# Patient Record
Sex: Female | Born: 1935 | Race: White | Hispanic: No | Smoking: Never smoker
Health system: Southern US, Community
[De-identification: ages and names within clinical notes are randomized; demographics above are authoritative.]

## PROBLEM LIST (undated history)

## (undated) DIAGNOSIS — M1990 Unspecified osteoarthritis, unspecified site: Secondary | ICD-10-CM

## (undated) DIAGNOSIS — E785 Hyperlipidemia, unspecified: Secondary | ICD-10-CM

## (undated) DIAGNOSIS — E079 Disorder of thyroid, unspecified: Secondary | ICD-10-CM

## (undated) DIAGNOSIS — M8580 Other specified disorders of bone density and structure, unspecified site: Secondary | ICD-10-CM

## (undated) DIAGNOSIS — K449 Diaphragmatic hernia without obstruction or gangrene: Secondary | ICD-10-CM

## (undated) DIAGNOSIS — E039 Hypothyroidism, unspecified: Secondary | ICD-10-CM

## (undated) DIAGNOSIS — I10 Essential (primary) hypertension: Secondary | ICD-10-CM

## (undated) DIAGNOSIS — M549 Dorsalgia, unspecified: Secondary | ICD-10-CM

## (undated) DIAGNOSIS — F419 Anxiety disorder, unspecified: Secondary | ICD-10-CM

## (undated) DIAGNOSIS — N390 Urinary tract infection, site not specified: Secondary | ICD-10-CM

## (undated) HISTORY — PX: HEMORRHOID SURGERY: SHX153

## (undated) HISTORY — DX: Diaphragmatic hernia without obstruction or gangrene: K44.9

## (undated) HISTORY — DX: Other specified disorders of bone density and structure, unspecified site: M85.80

## (undated) HISTORY — DX: Disorder of thyroid, unspecified: E07.9

## (undated) HISTORY — DX: Urinary tract infection, site not specified: N39.0

## (undated) HISTORY — DX: Hypothyroidism, unspecified: E03.9

## (undated) HISTORY — DX: Hyperlipidemia, unspecified: E78.5

## (undated) HISTORY — DX: Essential (primary) hypertension: I10

## (undated) HISTORY — PX: HERNIA REPAIR: SHX51

## (undated) HISTORY — DX: Dorsalgia, unspecified: M54.9

## (undated) HISTORY — DX: Anxiety disorder, unspecified: F41.9

## (undated) HISTORY — PX: CHOLECYSTECTOMY: SHX55

---

## 2003-06-10 ENCOUNTER — Other Ambulatory Visit: Payer: Self-pay

## 2004-02-28 ENCOUNTER — Other Ambulatory Visit: Payer: Self-pay

## 2004-07-24 ENCOUNTER — Emergency Department: Payer: Self-pay | Admitting: Emergency Medicine

## 2004-07-28 ENCOUNTER — Other Ambulatory Visit: Payer: Self-pay

## 2004-07-28 ENCOUNTER — Emergency Department: Payer: Self-pay | Admitting: Emergency Medicine

## 2005-05-25 ENCOUNTER — Emergency Department: Payer: Self-pay | Admitting: Emergency Medicine

## 2005-05-28 ENCOUNTER — Ambulatory Visit: Payer: Self-pay | Admitting: Family Medicine

## 2006-04-18 ENCOUNTER — Inpatient Hospital Stay: Payer: Self-pay | Admitting: Vascular Surgery

## 2006-05-14 ENCOUNTER — Emergency Department: Payer: Self-pay | Admitting: Emergency Medicine

## 2006-05-28 ENCOUNTER — Ambulatory Visit: Payer: Self-pay | Admitting: Vascular Surgery

## 2006-06-12 ENCOUNTER — Ambulatory Visit: Payer: Self-pay | Admitting: Nurse Practitioner

## 2006-07-11 DIAGNOSIS — F419 Anxiety disorder, unspecified: Secondary | ICD-10-CM | POA: Insufficient documentation

## 2006-07-11 DIAGNOSIS — G8929 Other chronic pain: Secondary | ICD-10-CM | POA: Insufficient documentation

## 2007-06-15 ENCOUNTER — Ambulatory Visit: Payer: Self-pay | Admitting: Family Medicine

## 2007-11-03 ENCOUNTER — Emergency Department: Payer: Self-pay | Admitting: Emergency Medicine

## 2007-11-08 ENCOUNTER — Other Ambulatory Visit: Payer: Self-pay

## 2007-11-08 ENCOUNTER — Emergency Department: Payer: Self-pay | Admitting: Internal Medicine

## 2007-11-18 ENCOUNTER — Ambulatory Visit: Payer: Self-pay | Admitting: Vascular Surgery

## 2007-12-09 ENCOUNTER — Ambulatory Visit: Payer: Self-pay | Admitting: Gastroenterology

## 2008-02-06 ENCOUNTER — Emergency Department: Payer: Self-pay | Admitting: Emergency Medicine

## 2008-07-07 ENCOUNTER — Ambulatory Visit: Payer: Self-pay | Admitting: Family Medicine

## 2009-04-14 ENCOUNTER — Emergency Department: Payer: Self-pay | Admitting: Emergency Medicine

## 2009-05-17 ENCOUNTER — Ambulatory Visit: Payer: Self-pay | Admitting: Orthopedic Surgery

## 2009-06-06 ENCOUNTER — Ambulatory Visit: Payer: Self-pay | Admitting: Pain Medicine

## 2009-06-27 ENCOUNTER — Ambulatory Visit: Payer: Self-pay | Admitting: Physician Assistant

## 2009-09-26 ENCOUNTER — Ambulatory Visit: Payer: Self-pay | Admitting: Pain Medicine

## 2009-10-12 ENCOUNTER — Ambulatory Visit: Payer: Self-pay | Admitting: Pain Medicine

## 2010-01-09 ENCOUNTER — Ambulatory Visit: Payer: Self-pay | Admitting: Pain Medicine

## 2010-01-24 ENCOUNTER — Ambulatory Visit: Payer: Self-pay | Admitting: Pain Medicine

## 2010-02-04 ENCOUNTER — Inpatient Hospital Stay: Payer: Self-pay | Admitting: Specialist

## 2010-02-27 ENCOUNTER — Ambulatory Visit: Payer: Self-pay | Admitting: Family Medicine

## 2010-04-24 ENCOUNTER — Ambulatory Visit: Payer: Self-pay | Admitting: Pain Medicine

## 2010-05-10 ENCOUNTER — Ambulatory Visit: Payer: Self-pay | Admitting: Pain Medicine

## 2010-05-26 ENCOUNTER — Emergency Department: Payer: Self-pay | Admitting: Emergency Medicine

## 2010-05-29 DIAGNOSIS — K8020 Calculus of gallbladder without cholecystitis without obstruction: Secondary | ICD-10-CM | POA: Insufficient documentation

## 2010-07-04 DIAGNOSIS — R300 Dysuria: Secondary | ICD-10-CM | POA: Insufficient documentation

## 2010-08-24 ENCOUNTER — Ambulatory Visit: Payer: Self-pay | Admitting: Family Medicine

## 2010-08-30 ENCOUNTER — Ambulatory Visit: Payer: Self-pay | Admitting: Pain Medicine

## 2010-09-10 ENCOUNTER — Ambulatory Visit: Payer: Self-pay | Admitting: Pain Medicine

## 2010-09-19 ENCOUNTER — Ambulatory Visit: Payer: Self-pay | Admitting: Pain Medicine

## 2010-09-28 ENCOUNTER — Ambulatory Visit: Payer: Self-pay | Admitting: Pain Medicine

## 2010-10-15 DIAGNOSIS — Z9181 History of falling: Secondary | ICD-10-CM | POA: Insufficient documentation

## 2010-10-17 ENCOUNTER — Ambulatory Visit: Payer: Self-pay | Admitting: Pain Medicine

## 2010-10-29 DIAGNOSIS — Z8679 Personal history of other diseases of the circulatory system: Secondary | ICD-10-CM | POA: Insufficient documentation

## 2011-01-03 ENCOUNTER — Ambulatory Visit: Payer: Self-pay | Admitting: Pain Medicine

## 2011-01-14 ENCOUNTER — Ambulatory Visit: Payer: Self-pay | Admitting: Pain Medicine

## 2011-02-28 ENCOUNTER — Ambulatory Visit: Payer: Self-pay | Admitting: Pain Medicine

## 2011-03-12 ENCOUNTER — Other Ambulatory Visit: Payer: Self-pay | Admitting: Pain Medicine

## 2011-03-18 ENCOUNTER — Ambulatory Visit: Payer: Self-pay | Admitting: Pain Medicine

## 2011-04-07 ENCOUNTER — Emergency Department: Payer: Self-pay | Admitting: Emergency Medicine

## 2011-05-15 ENCOUNTER — Ambulatory Visit: Payer: Self-pay | Admitting: Family Medicine

## 2011-06-05 DIAGNOSIS — F32A Depression, unspecified: Secondary | ICD-10-CM | POA: Insufficient documentation

## 2011-06-13 ENCOUNTER — Ambulatory Visit: Payer: Self-pay | Admitting: Pain Medicine

## 2011-07-01 ENCOUNTER — Inpatient Hospital Stay: Payer: Self-pay | Admitting: Internal Medicine

## 2011-07-03 ENCOUNTER — Ambulatory Visit: Payer: Self-pay | Admitting: Pain Medicine

## 2011-07-04 LAB — PATHOLOGY REPORT

## 2011-08-05 ENCOUNTER — Observation Stay: Payer: Self-pay | Admitting: Internal Medicine

## 2011-08-05 DIAGNOSIS — M545 Low back pain, unspecified: Secondary | ICD-10-CM | POA: Diagnosis not present

## 2011-08-05 DIAGNOSIS — E785 Hyperlipidemia, unspecified: Secondary | ICD-10-CM | POA: Diagnosis not present

## 2011-08-05 DIAGNOSIS — M810 Age-related osteoporosis without current pathological fracture: Secondary | ICD-10-CM | POA: Diagnosis not present

## 2011-08-05 DIAGNOSIS — E039 Hypothyroidism, unspecified: Secondary | ICD-10-CM | POA: Diagnosis not present

## 2011-08-05 DIAGNOSIS — G43909 Migraine, unspecified, not intractable, without status migrainosus: Secondary | ICD-10-CM | POA: Diagnosis not present

## 2011-08-05 DIAGNOSIS — G8929 Other chronic pain: Secondary | ICD-10-CM | POA: Diagnosis not present

## 2011-08-05 DIAGNOSIS — Z79899 Other long term (current) drug therapy: Secondary | ICD-10-CM | POA: Diagnosis not present

## 2011-08-05 DIAGNOSIS — F341 Dysthymic disorder: Secondary | ICD-10-CM | POA: Diagnosis not present

## 2011-08-05 DIAGNOSIS — I10 Essential (primary) hypertension: Secondary | ICD-10-CM | POA: Diagnosis not present

## 2011-08-05 DIAGNOSIS — H409 Unspecified glaucoma: Secondary | ICD-10-CM | POA: Diagnosis not present

## 2011-08-05 DIAGNOSIS — A498 Other bacterial infections of unspecified site: Secondary | ICD-10-CM | POA: Diagnosis not present

## 2011-08-05 DIAGNOSIS — N390 Urinary tract infection, site not specified: Secondary | ICD-10-CM | POA: Diagnosis not present

## 2011-08-05 DIAGNOSIS — M1990 Unspecified osteoarthritis, unspecified site: Secondary | ICD-10-CM | POA: Diagnosis not present

## 2011-08-05 LAB — URINALYSIS, COMPLETE
Bilirubin,UR: NEGATIVE
Blood: NEGATIVE
Glucose,UR: NEGATIVE mg/dL (ref 0–75)
Ketone: NEGATIVE
Leukocyte Esterase: NEGATIVE
Nitrite: NEGATIVE
Ph: 7 (ref 4.5–8.0)
Protein: NEGATIVE
RBC,UR: <1 /HPF (ref 0–5)
Specific Gravity: 1.004 (ref 1.003–1.030)
Squamous Epithelial: NONE SEEN
Transitional Epi: <1
WBC UR: 1 /HPF (ref 0–5)

## 2011-08-05 LAB — CBC
HCT: 40.8 % (ref 35.0–47.0)
HGB: 13.9 g/dL (ref 12.0–16.0)
MCH: 30 pg (ref 26.0–34.0)
MCHC: 34.1 g/dL (ref 32.0–36.0)
MCV: 88 fL (ref 80–100)
Platelet: 160 10*3/uL (ref 150–440)
RBC: 4.63 10*6/uL (ref 3.80–5.20)
RDW: 13.3 % (ref 11.5–14.5)
WBC: 4.6 10*3/uL (ref 3.6–11.0)

## 2011-08-05 LAB — BASIC METABOLIC PANEL
Anion Gap: 9 (ref 7–16)
BUN: 13 mg/dL (ref 7–18)
Calcium, Total: 9.7 mg/dL (ref 8.5–10.1)
Chloride: 93 mmol/L — ABNORMAL LOW (ref 98–107)
Co2: 30 mmol/L (ref 21–32)
Creatinine: 0.77 mg/dL (ref 0.60–1.30)
EGFR (African American): >60
EGFR (Non-African Amer.): >60
Glucose: 101 mg/dL — ABNORMAL HIGH (ref 65–99)
Osmolality: 265 (ref 275–301)
Potassium: 3.7 mmol/L (ref 3.5–5.1)
Sodium: 132 mmol/L — ABNORMAL LOW (ref 136–145)

## 2011-08-06 DIAGNOSIS — N390 Urinary tract infection, site not specified: Secondary | ICD-10-CM | POA: Diagnosis not present

## 2011-08-06 DIAGNOSIS — E039 Hypothyroidism, unspecified: Secondary | ICD-10-CM | POA: Diagnosis not present

## 2011-08-06 DIAGNOSIS — I10 Essential (primary) hypertension: Secondary | ICD-10-CM | POA: Diagnosis not present

## 2011-08-06 DIAGNOSIS — E785 Hyperlipidemia, unspecified: Secondary | ICD-10-CM | POA: Diagnosis not present

## 2011-08-06 LAB — CBC WITH DIFFERENTIAL/PLATELET
Basophil #: 0 10*3/uL (ref 0.0–0.1)
Basophil %: 0.5 %
Eosinophil #: 0 10*3/uL (ref 0.0–0.7)
Eosinophil %: 1 %
HCT: 36.1 % (ref 35.0–47.0)
HGB: 12.1 g/dL (ref 12.0–16.0)
Lymphocyte #: 1.3 10*3/uL (ref 1.0–3.6)
Lymphocyte %: 29 %
MCH: 30 pg (ref 26.0–34.0)
MCHC: 33.6 g/dL (ref 32.0–36.0)
MCV: 90 fL (ref 80–100)
Monocyte #: 0.7 10*3/uL (ref 0.0–0.7)
Monocyte %: 14.6 %
Neutrophil #: 2.5 10*3/uL (ref 1.4–6.5)
Neutrophil %: 54.9 %
Platelet: 140 10*3/uL — ABNORMAL LOW (ref 150–440)
RBC: 4.04 10*6/uL (ref 3.80–5.20)
RDW: 13.6 % (ref 11.5–14.5)
WBC: 4.6 10*3/uL (ref 3.6–11.0)

## 2011-08-06 LAB — BASIC METABOLIC PANEL
Anion Gap: 9 (ref 7–16)
BUN: 13 mg/dL (ref 7–18)
Calcium, Total: 9 mg/dL (ref 8.5–10.1)
Chloride: 98 mmol/L (ref 98–107)
Co2: 28 mmol/L (ref 21–32)
Creatinine: 0.71 mg/dL (ref 0.60–1.30)
EGFR (African American): >60
EGFR (Non-African Amer.): >60
Glucose: 97 mg/dL (ref 65–99)
Osmolality: 270 (ref 275–301)
Potassium: 3.5 mmol/L (ref 3.5–5.1)
Sodium: 135 mmol/L — ABNORMAL LOW (ref 136–145)

## 2011-08-06 LAB — MAGNESIUM: Magnesium: 2.3 mg/dL

## 2011-08-07 DIAGNOSIS — N390 Urinary tract infection, site not specified: Secondary | ICD-10-CM | POA: Diagnosis not present

## 2011-08-07 DIAGNOSIS — I10 Essential (primary) hypertension: Secondary | ICD-10-CM | POA: Diagnosis not present

## 2011-08-07 DIAGNOSIS — E785 Hyperlipidemia, unspecified: Secondary | ICD-10-CM | POA: Diagnosis not present

## 2011-08-07 DIAGNOSIS — E039 Hypothyroidism, unspecified: Secondary | ICD-10-CM | POA: Diagnosis not present

## 2011-08-07 LAB — URINE CULTURE

## 2011-08-08 DIAGNOSIS — N390 Urinary tract infection, site not specified: Secondary | ICD-10-CM | POA: Diagnosis not present

## 2011-08-14 DIAGNOSIS — I10 Essential (primary) hypertension: Secondary | ICD-10-CM | POA: Diagnosis not present

## 2011-08-14 DIAGNOSIS — Z1389 Encounter for screening for other disorder: Secondary | ICD-10-CM | POA: Diagnosis not present

## 2011-08-14 DIAGNOSIS — G4700 Insomnia, unspecified: Secondary | ICD-10-CM | POA: Diagnosis not present

## 2011-09-06 DIAGNOSIS — H4010X0 Unspecified open-angle glaucoma, stage unspecified: Secondary | ICD-10-CM | POA: Diagnosis not present

## 2011-09-16 DIAGNOSIS — E039 Hypothyroidism, unspecified: Secondary | ICD-10-CM | POA: Diagnosis not present

## 2011-09-16 DIAGNOSIS — I10 Essential (primary) hypertension: Secondary | ICD-10-CM | POA: Diagnosis not present

## 2011-09-16 DIAGNOSIS — R51 Headache: Secondary | ICD-10-CM | POA: Diagnosis not present

## 2011-09-16 DIAGNOSIS — E7800 Pure hypercholesterolemia, unspecified: Secondary | ICD-10-CM | POA: Diagnosis not present

## 2011-10-16 DIAGNOSIS — R6889 Other general symptoms and signs: Secondary | ICD-10-CM | POA: Diagnosis not present

## 2011-11-01 DIAGNOSIS — R51 Headache: Secondary | ICD-10-CM | POA: Diagnosis not present

## 2011-11-01 DIAGNOSIS — I10 Essential (primary) hypertension: Secondary | ICD-10-CM | POA: Diagnosis not present

## 2011-11-01 DIAGNOSIS — E039 Hypothyroidism, unspecified: Secondary | ICD-10-CM | POA: Diagnosis not present

## 2011-11-01 DIAGNOSIS — G4700 Insomnia, unspecified: Secondary | ICD-10-CM | POA: Diagnosis not present

## 2011-11-28 DIAGNOSIS — E785 Hyperlipidemia, unspecified: Secondary | ICD-10-CM | POA: Diagnosis not present

## 2011-11-28 DIAGNOSIS — I10 Essential (primary) hypertension: Secondary | ICD-10-CM | POA: Diagnosis not present

## 2011-11-28 DIAGNOSIS — E039 Hypothyroidism, unspecified: Secondary | ICD-10-CM | POA: Diagnosis not present

## 2011-12-04 DIAGNOSIS — E039 Hypothyroidism, unspecified: Secondary | ICD-10-CM | POA: Diagnosis not present

## 2011-12-04 DIAGNOSIS — R51 Headache: Secondary | ICD-10-CM | POA: Diagnosis not present

## 2012-01-24 DIAGNOSIS — H4010X0 Unspecified open-angle glaucoma, stage unspecified: Secondary | ICD-10-CM | POA: Diagnosis not present

## 2012-01-31 DIAGNOSIS — R51 Headache: Secondary | ICD-10-CM | POA: Diagnosis not present

## 2012-01-31 DIAGNOSIS — E039 Hypothyroidism, unspecified: Secondary | ICD-10-CM | POA: Diagnosis not present

## 2012-01-31 DIAGNOSIS — M549 Dorsalgia, unspecified: Secondary | ICD-10-CM | POA: Diagnosis not present

## 2012-01-31 DIAGNOSIS — G4700 Insomnia, unspecified: Secondary | ICD-10-CM | POA: Diagnosis not present

## 2012-03-04 DIAGNOSIS — E039 Hypothyroidism, unspecified: Secondary | ICD-10-CM | POA: Diagnosis not present

## 2012-03-04 DIAGNOSIS — R51 Headache: Secondary | ICD-10-CM | POA: Diagnosis not present

## 2012-03-04 DIAGNOSIS — M549 Dorsalgia, unspecified: Secondary | ICD-10-CM | POA: Diagnosis not present

## 2012-03-04 DIAGNOSIS — I10 Essential (primary) hypertension: Secondary | ICD-10-CM | POA: Diagnosis not present

## 2012-03-24 ENCOUNTER — Ambulatory Visit: Payer: Self-pay | Admitting: Pain Medicine

## 2012-03-24 DIAGNOSIS — IMO0002 Reserved for concepts with insufficient information to code with codable children: Secondary | ICD-10-CM | POA: Diagnosis not present

## 2012-04-08 ENCOUNTER — Ambulatory Visit: Payer: Self-pay | Admitting: Pain Medicine

## 2012-04-08 DIAGNOSIS — IMO0001 Reserved for inherently not codable concepts without codable children: Secondary | ICD-10-CM | POA: Diagnosis not present

## 2012-04-08 DIAGNOSIS — M79609 Pain in unspecified limb: Secondary | ICD-10-CM | POA: Diagnosis not present

## 2012-04-08 DIAGNOSIS — M545 Low back pain, unspecified: Secondary | ICD-10-CM | POA: Diagnosis not present

## 2012-04-08 DIAGNOSIS — M169 Osteoarthritis of hip, unspecified: Secondary | ICD-10-CM | POA: Diagnosis not present

## 2012-04-08 DIAGNOSIS — M4716 Other spondylosis with myelopathy, lumbar region: Secondary | ICD-10-CM | POA: Diagnosis not present

## 2012-04-08 DIAGNOSIS — M25569 Pain in unspecified knee: Secondary | ICD-10-CM | POA: Diagnosis not present

## 2012-04-08 DIAGNOSIS — M76899 Other specified enthesopathies of unspecified lower limb, excluding foot: Secondary | ICD-10-CM | POA: Diagnosis not present

## 2012-04-08 DIAGNOSIS — M25559 Pain in unspecified hip: Secondary | ICD-10-CM | POA: Diagnosis not present

## 2012-04-15 DIAGNOSIS — N390 Urinary tract infection, site not specified: Secondary | ICD-10-CM | POA: Diagnosis not present

## 2012-04-15 DIAGNOSIS — Z9181 History of falling: Secondary | ICD-10-CM | POA: Diagnosis not present

## 2012-04-15 DIAGNOSIS — L748 Other eccrine sweat disorders: Secondary | ICD-10-CM | POA: Diagnosis not present

## 2012-04-15 DIAGNOSIS — R5383 Other fatigue: Secondary | ICD-10-CM | POA: Diagnosis not present

## 2012-04-15 DIAGNOSIS — R5381 Other malaise: Secondary | ICD-10-CM | POA: Diagnosis not present

## 2012-04-22 ENCOUNTER — Observation Stay: Payer: Self-pay | Admitting: Internal Medicine

## 2012-04-22 DIAGNOSIS — E782 Mixed hyperlipidemia: Secondary | ICD-10-CM | POA: Diagnosis not present

## 2012-04-22 DIAGNOSIS — R079 Chest pain, unspecified: Secondary | ICD-10-CM | POA: Diagnosis not present

## 2012-04-22 DIAGNOSIS — R42 Dizziness and giddiness: Secondary | ICD-10-CM | POA: Diagnosis not present

## 2012-04-22 DIAGNOSIS — R0789 Other chest pain: Secondary | ICD-10-CM | POA: Diagnosis not present

## 2012-04-22 DIAGNOSIS — M549 Dorsalgia, unspecified: Secondary | ICD-10-CM | POA: Diagnosis not present

## 2012-04-22 DIAGNOSIS — Z79899 Other long term (current) drug therapy: Secondary | ICD-10-CM | POA: Diagnosis not present

## 2012-04-22 DIAGNOSIS — I10 Essential (primary) hypertension: Secondary | ICD-10-CM | POA: Diagnosis not present

## 2012-04-22 DIAGNOSIS — R110 Nausea: Secondary | ICD-10-CM | POA: Diagnosis not present

## 2012-04-22 DIAGNOSIS — E039 Hypothyroidism, unspecified: Secondary | ICD-10-CM | POA: Diagnosis not present

## 2012-04-22 DIAGNOSIS — E785 Hyperlipidemia, unspecified: Secondary | ICD-10-CM | POA: Diagnosis not present

## 2012-04-22 DIAGNOSIS — M1990 Unspecified osteoarthritis, unspecified site: Secondary | ICD-10-CM | POA: Diagnosis not present

## 2012-04-22 DIAGNOSIS — M25559 Pain in unspecified hip: Secondary | ICD-10-CM | POA: Diagnosis not present

## 2012-04-22 DIAGNOSIS — M25519 Pain in unspecified shoulder: Secondary | ICD-10-CM | POA: Diagnosis not present

## 2012-04-22 DIAGNOSIS — E871 Hypo-osmolality and hyponatremia: Secondary | ICD-10-CM | POA: Diagnosis not present

## 2012-04-22 DIAGNOSIS — G8929 Other chronic pain: Secondary | ICD-10-CM | POA: Diagnosis not present

## 2012-04-22 DIAGNOSIS — H409 Unspecified glaucoma: Secondary | ICD-10-CM | POA: Diagnosis not present

## 2012-04-22 DIAGNOSIS — R6889 Other general symptoms and signs: Secondary | ICD-10-CM | POA: Diagnosis not present

## 2012-04-22 LAB — COMPREHENSIVE METABOLIC PANEL
Albumin: 3.6 g/dL (ref 3.4–5.0)
Alkaline Phosphatase: 80 U/L (ref 50–136)
Anion Gap: 7 (ref 7–16)
BUN: 13 mg/dL (ref 7–18)
Bilirubin,Total: 0.5 mg/dL (ref 0.2–1.0)
Calcium, Total: 8.6 mg/dL (ref 8.5–10.1)
Chloride: 93 mmol/L — ABNORMAL LOW (ref 98–107)
Co2: 26 mmol/L (ref 21–32)
Creatinine: 0.73 mg/dL (ref 0.60–1.30)
EGFR (African American): >60
EGFR (Non-African Amer.): >60
Glucose: 123 mg/dL — ABNORMAL HIGH (ref 65–99)
Osmolality: 255 (ref 275–301)
Potassium: 4 mmol/L (ref 3.5–5.1)
SGOT(AST): 21 U/L (ref 15–37)
SGPT (ALT): 20 U/L (ref 12–78)
Sodium: 126 mmol/L — ABNORMAL LOW (ref 136–145)
Total Protein: 7.3 g/dL (ref 6.4–8.2)

## 2012-04-22 LAB — CREATININE, URINE, RANDOM: Creatinine, Urine Random: 64.5 mg/dL (ref 30.0–125.0)

## 2012-04-22 LAB — CBC
HCT: 42 % (ref 35.0–47.0)
HGB: 14.7 g/dL (ref 12.0–16.0)
MCH: 30.1 pg (ref 26.0–34.0)
MCHC: 35.1 g/dL (ref 32.0–36.0)
MCV: 86 fL (ref 80–100)
Platelet: 203 10*3/uL (ref 150–440)
RBC: 4.89 10*6/uL (ref 3.80–5.20)
RDW: 14.1 % (ref 11.5–14.5)
WBC: 6.1 10*3/uL (ref 3.6–11.0)

## 2012-04-22 LAB — TROPONIN I
Troponin-I: <0.02 ng/mL
Troponin-I: <0.02 ng/mL

## 2012-04-22 LAB — CK TOTAL AND CKMB (NOT AT ARMC)
CK, Total: 67 U/L (ref 21–215)
CK, Total: 51 U/L (ref 21–215)
CK-MB: 1 ng/mL (ref 0.5–3.6)
CK-MB: 1.4 ng/mL (ref 0.5–3.6)

## 2012-04-22 LAB — URINALYSIS, COMPLETE
Bilirubin,UR: NEGATIVE
Glucose,UR: NEGATIVE mg/dL (ref 0–75)
Ketone: NEGATIVE
Nitrite: NEGATIVE
Ph: 6 (ref 4.5–8.0)
Protein: 100
RBC,UR: 10 /HPF (ref 0–5)
Specific Gravity: 1.014 (ref 1.003–1.030)
Squamous Epithelial: 3
WBC UR: 3 /HPF (ref 0–5)

## 2012-04-22 LAB — SODIUM, URINE, RANDOM: Sodium, Urine Random: 49 mmol/L (ref 20–110)

## 2012-04-22 LAB — LIPASE, BLOOD: Lipase: 159 U/L (ref 73–393)

## 2012-04-22 LAB — OSMOLALITY, URINE: Osmolality: 381 mOsm/kg

## 2012-04-23 DIAGNOSIS — R079 Chest pain, unspecified: Secondary | ICD-10-CM

## 2012-04-23 DIAGNOSIS — E871 Hypo-osmolality and hyponatremia: Secondary | ICD-10-CM | POA: Diagnosis not present

## 2012-04-23 DIAGNOSIS — E782 Mixed hyperlipidemia: Secondary | ICD-10-CM | POA: Diagnosis not present

## 2012-04-23 DIAGNOSIS — I10 Essential (primary) hypertension: Secondary | ICD-10-CM | POA: Diagnosis not present

## 2012-04-23 LAB — BASIC METABOLIC PANEL
Anion Gap: 12 (ref 7–16)
BUN: 8 mg/dL (ref 7–18)
Calcium, Total: 9.3 mg/dL (ref 8.5–10.1)
Chloride: 86 mmol/L — ABNORMAL LOW (ref 98–107)
Co2: 23 mmol/L (ref 21–32)
Creatinine: 0.71 mg/dL (ref 0.60–1.30)
EGFR (African American): >60
EGFR (Non-African Amer.): >60
Glucose: 115 mg/dL — ABNORMAL HIGH (ref 65–99)
Osmolality: 243 (ref 275–301)
Potassium: 3.6 mmol/L (ref 3.5–5.1)
Sodium: 121 mmol/L — ABNORMAL LOW (ref 136–145)

## 2012-04-23 LAB — TROPONIN I: Troponin-I: <0.02 ng/mL

## 2012-04-23 LAB — CK TOTAL AND CKMB (NOT AT ARMC)
CK, Total: 65 U/L (ref 21–215)
CK-MB: 1.4 ng/mL (ref 0.5–3.6)

## 2012-04-23 LAB — SODIUM, URINE, RANDOM: Sodium, Urine Random: 41 mmol/L (ref 20–110)

## 2012-04-23 LAB — CHLORIDE, URINE, RANDOM: Chloride, Urine Random: 40 mmol/L — ABNORMAL LOW (ref 55–125)

## 2012-04-23 LAB — OSMOLALITY, URINE: Osmolality: 216 mOsm/kg

## 2012-04-24 DIAGNOSIS — E871 Hypo-osmolality and hyponatremia: Secondary | ICD-10-CM | POA: Diagnosis not present

## 2012-04-24 DIAGNOSIS — R079 Chest pain, unspecified: Secondary | ICD-10-CM | POA: Diagnosis not present

## 2012-04-24 DIAGNOSIS — I10 Essential (primary) hypertension: Secondary | ICD-10-CM | POA: Diagnosis not present

## 2012-04-24 DIAGNOSIS — E782 Mixed hyperlipidemia: Secondary | ICD-10-CM | POA: Diagnosis not present

## 2012-04-24 LAB — BASIC METABOLIC PANEL
Anion Gap: 10 (ref 7–16)
BUN: 10 mg/dL (ref 7–18)
Calcium, Total: 9 mg/dL (ref 8.5–10.1)
Chloride: 86 mmol/L — ABNORMAL LOW (ref 98–107)
Co2: 25 mmol/L (ref 21–32)
Creatinine: 0.81 mg/dL (ref 0.60–1.30)
EGFR (African American): >60
EGFR (Non-African Amer.): >60
Glucose: 108 mg/dL — ABNORMAL HIGH (ref 65–99)
Osmolality: 244 (ref 275–301)
Potassium: 3.6 mmol/L (ref 3.5–5.1)
Sodium: 121 mmol/L — ABNORMAL LOW (ref 136–145)

## 2012-04-24 LAB — SODIUM: Sodium: 128 mmol/L — ABNORMAL LOW (ref 136–145)

## 2012-04-25 LAB — URINE CULTURE

## 2012-05-01 DIAGNOSIS — A499 Bacterial infection, unspecified: Secondary | ICD-10-CM | POA: Diagnosis not present

## 2012-05-01 DIAGNOSIS — N390 Urinary tract infection, site not specified: Secondary | ICD-10-CM | POA: Diagnosis not present

## 2012-05-01 DIAGNOSIS — E871 Hypo-osmolality and hyponatremia: Secondary | ICD-10-CM | POA: Diagnosis not present

## 2012-05-01 DIAGNOSIS — I10 Essential (primary) hypertension: Secondary | ICD-10-CM | POA: Diagnosis not present

## 2012-05-12 DIAGNOSIS — E7800 Pure hypercholesterolemia, unspecified: Secondary | ICD-10-CM | POA: Diagnosis not present

## 2012-05-12 DIAGNOSIS — R51 Headache: Secondary | ICD-10-CM | POA: Diagnosis not present

## 2012-05-12 DIAGNOSIS — N289 Disorder of kidney and ureter, unspecified: Secondary | ICD-10-CM | POA: Diagnosis not present

## 2012-05-12 DIAGNOSIS — I10 Essential (primary) hypertension: Secondary | ICD-10-CM | POA: Diagnosis not present

## 2012-05-12 DIAGNOSIS — E039 Hypothyroidism, unspecified: Secondary | ICD-10-CM | POA: Diagnosis not present

## 2012-05-29 DIAGNOSIS — H4010X0 Unspecified open-angle glaucoma, stage unspecified: Secondary | ICD-10-CM | POA: Diagnosis not present

## 2012-06-05 DIAGNOSIS — N390 Urinary tract infection, site not specified: Secondary | ICD-10-CM | POA: Diagnosis not present

## 2012-06-05 DIAGNOSIS — I10 Essential (primary) hypertension: Secondary | ICD-10-CM | POA: Diagnosis not present

## 2012-06-05 DIAGNOSIS — E871 Hypo-osmolality and hyponatremia: Secondary | ICD-10-CM | POA: Diagnosis not present

## 2012-06-07 ENCOUNTER — Emergency Department: Payer: Self-pay | Admitting: Unknown Physician Specialty

## 2012-06-07 DIAGNOSIS — M25559 Pain in unspecified hip: Secondary | ICD-10-CM | POA: Diagnosis not present

## 2012-06-07 DIAGNOSIS — S7000XA Contusion of unspecified hip, initial encounter: Secondary | ICD-10-CM | POA: Diagnosis not present

## 2012-06-10 ENCOUNTER — Ambulatory Visit: Payer: Self-pay | Admitting: Orthopedic Surgery

## 2012-06-10 DIAGNOSIS — M25559 Pain in unspecified hip: Secondary | ICD-10-CM | POA: Diagnosis not present

## 2012-06-10 DIAGNOSIS — R937 Abnormal findings on diagnostic imaging of other parts of musculoskeletal system: Secondary | ICD-10-CM | POA: Diagnosis not present

## 2012-06-10 DIAGNOSIS — S7000XA Contusion of unspecified hip, initial encounter: Secondary | ICD-10-CM | POA: Diagnosis not present

## 2012-06-12 DIAGNOSIS — M549 Dorsalgia, unspecified: Secondary | ICD-10-CM | POA: Diagnosis not present

## 2012-06-12 DIAGNOSIS — IMO0002 Reserved for concepts with insufficient information to code with codable children: Secondary | ICD-10-CM | POA: Diagnosis not present

## 2012-06-29 DIAGNOSIS — E039 Hypothyroidism, unspecified: Secondary | ICD-10-CM | POA: Diagnosis not present

## 2012-06-29 DIAGNOSIS — R51 Headache: Secondary | ICD-10-CM | POA: Diagnosis not present

## 2012-06-29 DIAGNOSIS — N289 Disorder of kidney and ureter, unspecified: Secondary | ICD-10-CM | POA: Diagnosis not present

## 2012-06-29 DIAGNOSIS — E7800 Pure hypercholesterolemia, unspecified: Secondary | ICD-10-CM | POA: Diagnosis not present

## 2012-06-30 ENCOUNTER — Ambulatory Visit: Payer: Self-pay | Admitting: Ophthalmology

## 2012-06-30 DIAGNOSIS — Z01810 Encounter for preprocedural cardiovascular examination: Secondary | ICD-10-CM | POA: Diagnosis not present

## 2012-06-30 DIAGNOSIS — I10 Essential (primary) hypertension: Secondary | ICD-10-CM | POA: Diagnosis not present

## 2012-06-30 DIAGNOSIS — H2510 Age-related nuclear cataract, unspecified eye: Secondary | ICD-10-CM | POA: Diagnosis not present

## 2012-06-30 DIAGNOSIS — Z01812 Encounter for preprocedural laboratory examination: Secondary | ICD-10-CM | POA: Diagnosis not present

## 2012-06-30 LAB — POTASSIUM: Potassium: 4.1 mmol/L (ref 3.5–5.1)

## 2012-07-02 ENCOUNTER — Ambulatory Visit: Payer: Self-pay | Admitting: Pain Medicine

## 2012-07-02 DIAGNOSIS — IMO0002 Reserved for concepts with insufficient information to code with codable children: Secondary | ICD-10-CM | POA: Diagnosis not present

## 2012-07-06 DIAGNOSIS — N289 Disorder of kidney and ureter, unspecified: Secondary | ICD-10-CM | POA: Diagnosis not present

## 2012-07-06 DIAGNOSIS — F29 Unspecified psychosis not due to a substance or known physiological condition: Secondary | ICD-10-CM | POA: Diagnosis not present

## 2012-07-06 DIAGNOSIS — N390 Urinary tract infection, site not specified: Secondary | ICD-10-CM | POA: Diagnosis not present

## 2012-07-08 ENCOUNTER — Ambulatory Visit: Payer: Self-pay | Admitting: Ophthalmology

## 2012-07-08 DIAGNOSIS — I10 Essential (primary) hypertension: Secondary | ICD-10-CM | POA: Diagnosis not present

## 2012-07-08 DIAGNOSIS — Z85828 Personal history of other malignant neoplasm of skin: Secondary | ICD-10-CM | POA: Diagnosis not present

## 2012-07-08 DIAGNOSIS — E7800 Pure hypercholesterolemia, unspecified: Secondary | ICD-10-CM | POA: Diagnosis not present

## 2012-07-08 DIAGNOSIS — Z79899 Other long term (current) drug therapy: Secondary | ICD-10-CM | POA: Diagnosis not present

## 2012-07-08 DIAGNOSIS — G43909 Migraine, unspecified, not intractable, without status migrainosus: Secondary | ICD-10-CM | POA: Diagnosis not present

## 2012-07-08 DIAGNOSIS — H269 Unspecified cataract: Secondary | ICD-10-CM | POA: Diagnosis not present

## 2012-07-08 DIAGNOSIS — H2510 Age-related nuclear cataract, unspecified eye: Secondary | ICD-10-CM | POA: Diagnosis not present

## 2012-07-08 DIAGNOSIS — H9190 Unspecified hearing loss, unspecified ear: Secondary | ICD-10-CM | POA: Diagnosis not present

## 2012-07-08 DIAGNOSIS — K219 Gastro-esophageal reflux disease without esophagitis: Secondary | ICD-10-CM | POA: Diagnosis not present

## 2012-07-08 DIAGNOSIS — M545 Low back pain, unspecified: Secondary | ICD-10-CM | POA: Diagnosis not present

## 2012-07-08 DIAGNOSIS — R002 Palpitations: Secondary | ICD-10-CM | POA: Diagnosis not present

## 2012-07-08 DIAGNOSIS — R609 Edema, unspecified: Secondary | ICD-10-CM | POA: Diagnosis not present

## 2012-07-08 DIAGNOSIS — I498 Other specified cardiac arrhythmias: Secondary | ICD-10-CM | POA: Diagnosis not present

## 2012-07-08 DIAGNOSIS — M129 Arthropathy, unspecified: Secondary | ICD-10-CM | POA: Diagnosis not present

## 2012-07-08 DIAGNOSIS — E079 Disorder of thyroid, unspecified: Secondary | ICD-10-CM | POA: Diagnosis not present

## 2012-08-04 DIAGNOSIS — F29 Unspecified psychosis not due to a substance or known physiological condition: Secondary | ICD-10-CM | POA: Diagnosis not present

## 2012-08-04 DIAGNOSIS — N390 Urinary tract infection, site not specified: Secondary | ICD-10-CM | POA: Diagnosis not present

## 2012-08-04 DIAGNOSIS — E039 Hypothyroidism, unspecified: Secondary | ICD-10-CM | POA: Diagnosis not present

## 2012-08-04 DIAGNOSIS — R51 Headache: Secondary | ICD-10-CM | POA: Diagnosis not present

## 2012-08-04 DIAGNOSIS — I10 Essential (primary) hypertension: Secondary | ICD-10-CM | POA: Diagnosis not present

## 2012-08-13 DIAGNOSIS — H02059 Trichiasis without entropian unspecified eye, unspecified eyelid: Secondary | ICD-10-CM | POA: Diagnosis not present

## 2012-09-03 DIAGNOSIS — E039 Hypothyroidism, unspecified: Secondary | ICD-10-CM | POA: Diagnosis not present

## 2012-09-03 DIAGNOSIS — N289 Disorder of kidney and ureter, unspecified: Secondary | ICD-10-CM | POA: Diagnosis not present

## 2012-09-04 ENCOUNTER — Ambulatory Visit: Payer: Self-pay | Admitting: Pain Medicine

## 2012-09-04 DIAGNOSIS — E039 Hypothyroidism, unspecified: Secondary | ICD-10-CM | POA: Diagnosis not present

## 2012-09-04 DIAGNOSIS — F411 Generalized anxiety disorder: Secondary | ICD-10-CM | POA: Diagnosis not present

## 2012-09-04 DIAGNOSIS — IMO0001 Reserved for inherently not codable concepts without codable children: Secondary | ICD-10-CM | POA: Diagnosis not present

## 2012-09-04 DIAGNOSIS — K449 Diaphragmatic hernia without obstruction or gangrene: Secondary | ICD-10-CM | POA: Diagnosis not present

## 2012-09-04 DIAGNOSIS — G8929 Other chronic pain: Secondary | ICD-10-CM | POA: Diagnosis not present

## 2012-09-04 DIAGNOSIS — M79609 Pain in unspecified limb: Secondary | ICD-10-CM | POA: Diagnosis not present

## 2012-09-04 DIAGNOSIS — M76899 Other specified enthesopathies of unspecified lower limb, excluding foot: Secondary | ICD-10-CM | POA: Diagnosis not present

## 2012-09-04 DIAGNOSIS — M169 Osteoarthritis of hip, unspecified: Secondary | ICD-10-CM | POA: Diagnosis not present

## 2012-09-04 DIAGNOSIS — M4716 Other spondylosis with myelopathy, lumbar region: Secondary | ICD-10-CM | POA: Diagnosis not present

## 2012-09-04 DIAGNOSIS — M5137 Other intervertebral disc degeneration, lumbosacral region: Secondary | ICD-10-CM | POA: Diagnosis not present

## 2012-09-04 DIAGNOSIS — M461 Sacroiliitis, not elsewhere classified: Secondary | ICD-10-CM | POA: Diagnosis not present

## 2012-09-04 DIAGNOSIS — I714 Abdominal aortic aneurysm, without rupture: Secondary | ICD-10-CM | POA: Diagnosis not present

## 2012-09-04 DIAGNOSIS — M545 Low back pain, unspecified: Secondary | ICD-10-CM | POA: Diagnosis not present

## 2012-09-04 DIAGNOSIS — I10 Essential (primary) hypertension: Secondary | ICD-10-CM | POA: Diagnosis not present

## 2012-09-04 DIAGNOSIS — M899 Disorder of bone, unspecified: Secondary | ICD-10-CM | POA: Diagnosis not present

## 2012-09-08 DIAGNOSIS — E039 Hypothyroidism, unspecified: Secondary | ICD-10-CM | POA: Diagnosis not present

## 2012-09-08 DIAGNOSIS — E7800 Pure hypercholesterolemia, unspecified: Secondary | ICD-10-CM | POA: Diagnosis not present

## 2012-09-08 DIAGNOSIS — N289 Disorder of kidney and ureter, unspecified: Secondary | ICD-10-CM | POA: Diagnosis not present

## 2012-09-08 DIAGNOSIS — I10 Essential (primary) hypertension: Secondary | ICD-10-CM | POA: Diagnosis not present

## 2012-09-15 ENCOUNTER — Ambulatory Visit: Payer: Self-pay | Admitting: Pain Medicine

## 2012-09-15 DIAGNOSIS — M461 Sacroiliitis, not elsewhere classified: Secondary | ICD-10-CM | POA: Diagnosis not present

## 2012-09-15 DIAGNOSIS — M76899 Other specified enthesopathies of unspecified lower limb, excluding foot: Secondary | ICD-10-CM | POA: Diagnosis not present

## 2012-09-15 DIAGNOSIS — M169 Osteoarthritis of hip, unspecified: Secondary | ICD-10-CM | POA: Diagnosis not present

## 2012-10-07 DIAGNOSIS — E039 Hypothyroidism, unspecified: Secondary | ICD-10-CM | POA: Diagnosis not present

## 2012-10-07 DIAGNOSIS — E7800 Pure hypercholesterolemia, unspecified: Secondary | ICD-10-CM | POA: Diagnosis not present

## 2012-10-07 DIAGNOSIS — I10 Essential (primary) hypertension: Secondary | ICD-10-CM | POA: Diagnosis not present

## 2012-10-16 ENCOUNTER — Ambulatory Visit: Payer: Self-pay | Admitting: Pain Medicine

## 2012-10-16 DIAGNOSIS — I10 Essential (primary) hypertension: Secondary | ICD-10-CM | POA: Diagnosis not present

## 2012-10-16 DIAGNOSIS — M79609 Pain in unspecified limb: Secondary | ICD-10-CM | POA: Diagnosis not present

## 2012-10-16 DIAGNOSIS — E039 Hypothyroidism, unspecified: Secondary | ICD-10-CM | POA: Diagnosis not present

## 2012-10-16 DIAGNOSIS — M4716 Other spondylosis with myelopathy, lumbar region: Secondary | ICD-10-CM | POA: Diagnosis not present

## 2012-10-16 DIAGNOSIS — G8929 Other chronic pain: Secondary | ICD-10-CM | POA: Diagnosis not present

## 2012-10-16 DIAGNOSIS — M169 Osteoarthritis of hip, unspecified: Secondary | ICD-10-CM | POA: Diagnosis not present

## 2012-10-16 DIAGNOSIS — M545 Low back pain, unspecified: Secondary | ICD-10-CM | POA: Diagnosis not present

## 2012-10-16 DIAGNOSIS — M5137 Other intervertebral disc degeneration, lumbosacral region: Secondary | ICD-10-CM | POA: Diagnosis not present

## 2012-10-16 DIAGNOSIS — F411 Generalized anxiety disorder: Secondary | ICD-10-CM | POA: Diagnosis not present

## 2012-10-16 DIAGNOSIS — M76899 Other specified enthesopathies of unspecified lower limb, excluding foot: Secondary | ICD-10-CM | POA: Diagnosis not present

## 2012-10-16 DIAGNOSIS — K449 Diaphragmatic hernia without obstruction or gangrene: Secondary | ICD-10-CM | POA: Diagnosis not present

## 2012-10-16 DIAGNOSIS — M899 Disorder of bone, unspecified: Secondary | ICD-10-CM | POA: Diagnosis not present

## 2012-10-16 DIAGNOSIS — IMO0001 Reserved for inherently not codable concepts without codable children: Secondary | ICD-10-CM | POA: Diagnosis not present

## 2012-10-16 DIAGNOSIS — M461 Sacroiliitis, not elsewhere classified: Secondary | ICD-10-CM | POA: Diagnosis not present

## 2012-11-11 DIAGNOSIS — R51 Headache: Secondary | ICD-10-CM | POA: Diagnosis not present

## 2012-11-11 DIAGNOSIS — I10 Essential (primary) hypertension: Secondary | ICD-10-CM | POA: Diagnosis not present

## 2012-11-17 ENCOUNTER — Emergency Department: Payer: Self-pay | Admitting: Emergency Medicine

## 2012-11-17 DIAGNOSIS — Z79899 Other long term (current) drug therapy: Secondary | ICD-10-CM | POA: Diagnosis not present

## 2012-11-17 DIAGNOSIS — G43909 Migraine, unspecified, not intractable, without status migrainosus: Secondary | ICD-10-CM | POA: Diagnosis not present

## 2012-11-17 DIAGNOSIS — I10 Essential (primary) hypertension: Secondary | ICD-10-CM | POA: Diagnosis not present

## 2012-11-17 DIAGNOSIS — R9431 Abnormal electrocardiogram [ECG] [EKG]: Secondary | ICD-10-CM | POA: Diagnosis not present

## 2012-11-17 DIAGNOSIS — N390 Urinary tract infection, site not specified: Secondary | ICD-10-CM | POA: Diagnosis not present

## 2012-11-17 DIAGNOSIS — H409 Unspecified glaucoma: Secondary | ICD-10-CM | POA: Diagnosis not present

## 2012-11-17 LAB — BASIC METABOLIC PANEL
Anion Gap: 6 — ABNORMAL LOW (ref 7–16)
BUN: 14 mg/dL (ref 7–18)
Calcium, Total: 9.7 mg/dL (ref 8.5–10.1)
Chloride: 99 mmol/L (ref 98–107)
Co2: 29 mmol/L (ref 21–32)
Creatinine: 0.74 mg/dL (ref 0.60–1.30)
EGFR (African American): >60
EGFR (Non-African Amer.): >60
Glucose: 114 mg/dL — ABNORMAL HIGH (ref 65–99)
Osmolality: 270 (ref 275–301)
Potassium: 3.6 mmol/L (ref 3.5–5.1)
Sodium: 134 mmol/L — ABNORMAL LOW (ref 136–145)

## 2012-11-17 LAB — CBC
HCT: 43.5 % (ref 35.0–47.0)
HGB: 14.6 g/dL (ref 12.0–16.0)
MCH: 27.1 pg (ref 26.0–34.0)
MCHC: 33.5 g/dL (ref 32.0–36.0)
MCV: 81 fL (ref 80–100)
Platelet: 179 10*3/uL (ref 150–440)
RBC: 5.38 10*6/uL — ABNORMAL HIGH (ref 3.80–5.20)
RDW: 14.8 % — ABNORMAL HIGH (ref 11.5–14.5)
WBC: 5.5 10*3/uL (ref 3.6–11.0)

## 2012-11-17 LAB — URINALYSIS, COMPLETE
Bilirubin,UR: NEGATIVE
Glucose,UR: NEGATIVE mg/dL (ref 0–75)
Hyaline Cast: 1
Ketone: NEGATIVE
Nitrite: NEGATIVE
Ph: 7 (ref 4.5–8.0)
Protein: NEGATIVE
RBC,UR: 5 /HPF (ref 0–5)
Specific Gravity: 1.01 (ref 1.003–1.030)
Squamous Epithelial: NONE SEEN
WBC UR: 39 /HPF (ref 0–5)

## 2012-11-18 ENCOUNTER — Ambulatory Visit: Payer: Self-pay | Admitting: Family Medicine

## 2012-11-18 DIAGNOSIS — F29 Unspecified psychosis not due to a substance or known physiological condition: Secondary | ICD-10-CM | POA: Diagnosis not present

## 2012-11-18 DIAGNOSIS — G319 Degenerative disease of nervous system, unspecified: Secondary | ICD-10-CM | POA: Diagnosis not present

## 2012-11-20 DIAGNOSIS — H4010X0 Unspecified open-angle glaucoma, stage unspecified: Secondary | ICD-10-CM | POA: Diagnosis not present

## 2012-11-21 LAB — URINE CULTURE

## 2012-11-25 DIAGNOSIS — I10 Essential (primary) hypertension: Secondary | ICD-10-CM | POA: Diagnosis not present

## 2012-11-25 DIAGNOSIS — N390 Urinary tract infection, site not specified: Secondary | ICD-10-CM | POA: Diagnosis not present

## 2012-11-25 DIAGNOSIS — K219 Gastro-esophageal reflux disease without esophagitis: Secondary | ICD-10-CM | POA: Diagnosis not present

## 2012-12-04 DIAGNOSIS — I10 Essential (primary) hypertension: Secondary | ICD-10-CM | POA: Diagnosis not present

## 2012-12-04 DIAGNOSIS — N390 Urinary tract infection, site not specified: Secondary | ICD-10-CM | POA: Diagnosis not present

## 2012-12-04 DIAGNOSIS — E871 Hypo-osmolality and hyponatremia: Secondary | ICD-10-CM | POA: Diagnosis not present

## 2012-12-04 DIAGNOSIS — R609 Edema, unspecified: Secondary | ICD-10-CM | POA: Diagnosis not present

## 2012-12-04 DIAGNOSIS — N2581 Secondary hyperparathyroidism of renal origin: Secondary | ICD-10-CM | POA: Diagnosis not present

## 2012-12-30 DIAGNOSIS — I10 Essential (primary) hypertension: Secondary | ICD-10-CM | POA: Diagnosis not present

## 2012-12-30 DIAGNOSIS — R300 Dysuria: Secondary | ICD-10-CM | POA: Diagnosis not present

## 2012-12-30 DIAGNOSIS — N289 Disorder of kidney and ureter, unspecified: Secondary | ICD-10-CM | POA: Diagnosis not present

## 2012-12-30 DIAGNOSIS — E7800 Pure hypercholesterolemia, unspecified: Secondary | ICD-10-CM | POA: Diagnosis not present

## 2012-12-30 DIAGNOSIS — E039 Hypothyroidism, unspecified: Secondary | ICD-10-CM | POA: Diagnosis not present

## 2013-01-09 ENCOUNTER — Emergency Department: Payer: Self-pay | Admitting: Internal Medicine

## 2013-01-09 DIAGNOSIS — K5289 Other specified noninfective gastroenteritis and colitis: Secondary | ICD-10-CM | POA: Diagnosis not present

## 2013-01-09 DIAGNOSIS — K8020 Calculus of gallbladder without cholecystitis without obstruction: Secondary | ICD-10-CM | POA: Diagnosis not present

## 2013-01-09 DIAGNOSIS — I10 Essential (primary) hypertension: Secondary | ICD-10-CM | POA: Diagnosis not present

## 2013-01-09 DIAGNOSIS — R52 Pain, unspecified: Secondary | ICD-10-CM | POA: Diagnosis not present

## 2013-01-09 DIAGNOSIS — K859 Acute pancreatitis without necrosis or infection, unspecified: Secondary | ICD-10-CM | POA: Diagnosis not present

## 2013-01-09 DIAGNOSIS — R6889 Other general symptoms and signs: Secondary | ICD-10-CM | POA: Diagnosis not present

## 2013-01-09 DIAGNOSIS — R109 Unspecified abdominal pain: Secondary | ICD-10-CM | POA: Diagnosis not present

## 2013-01-09 LAB — URINALYSIS, COMPLETE
Bilirubin,UR: NEGATIVE
Glucose,UR: NEGATIVE mg/dL (ref 0–75)
Ketone: NEGATIVE
Leukocyte Esterase: NEGATIVE
Nitrite: NEGATIVE
Ph: 8 (ref 4.5–8.0)
Protein: NEGATIVE
RBC,UR: 1 /HPF (ref 0–5)
Specific Gravity: 1.003 (ref 1.003–1.030)
Squamous Epithelial: <1
WBC UR: <1 /HPF (ref 0–5)

## 2013-01-09 LAB — CBC
HCT: 37.8 % (ref 35.0–47.0)
HGB: 12.8 g/dL (ref 12.0–16.0)
MCH: 26.4 pg (ref 26.0–34.0)
MCHC: 33.8 g/dL (ref 32.0–36.0)
MCV: 78 fL — ABNORMAL LOW (ref 80–100)
Platelet: 190 10*3/uL (ref 150–440)
RBC: 4.83 10*6/uL (ref 3.80–5.20)
RDW: 14.3 % (ref 11.5–14.5)
WBC: 6 10*3/uL (ref 3.6–11.0)

## 2013-01-09 LAB — COMPREHENSIVE METABOLIC PANEL
Albumin: 3.4 g/dL (ref 3.4–5.0)
Alkaline Phosphatase: 73 U/L (ref 50–136)
Anion Gap: 9 (ref 7–16)
BUN: 12 mg/dL (ref 7–18)
Bilirubin,Total: 0.4 mg/dL (ref 0.2–1.0)
Calcium, Total: 9.4 mg/dL (ref 8.5–10.1)
Chloride: 102 mmol/L (ref 98–107)
Co2: 23 mmol/L (ref 21–32)
Creatinine: 0.63 mg/dL (ref 0.60–1.30)
EGFR (African American): >60
EGFR (Non-African Amer.): >60
Glucose: 104 mg/dL — ABNORMAL HIGH (ref 65–99)
Osmolality: 268 (ref 275–301)
Potassium: 3.5 mmol/L (ref 3.5–5.1)
SGOT(AST): 12 U/L — ABNORMAL LOW (ref 15–37)
SGPT (ALT): 19 U/L (ref 12–78)
Sodium: 134 mmol/L — ABNORMAL LOW (ref 136–145)
Total Protein: 7 g/dL (ref 6.4–8.2)

## 2013-01-09 LAB — LIPASE, BLOOD: Lipase: 454 U/L — ABNORMAL HIGH (ref 73–393)

## 2013-01-26 DIAGNOSIS — I10 Essential (primary) hypertension: Secondary | ICD-10-CM | POA: Diagnosis not present

## 2013-01-26 DIAGNOSIS — E039 Hypothyroidism, unspecified: Secondary | ICD-10-CM | POA: Diagnosis not present

## 2013-01-26 DIAGNOSIS — N289 Disorder of kidney and ureter, unspecified: Secondary | ICD-10-CM | POA: Diagnosis not present

## 2013-01-26 DIAGNOSIS — R51 Headache: Secondary | ICD-10-CM | POA: Diagnosis not present

## 2013-02-02 ENCOUNTER — Ambulatory Visit: Payer: Self-pay | Admitting: Pain Medicine

## 2013-02-02 DIAGNOSIS — M76899 Other specified enthesopathies of unspecified lower limb, excluding foot: Secondary | ICD-10-CM | POA: Diagnosis not present

## 2013-02-02 DIAGNOSIS — M169 Osteoarthritis of hip, unspecified: Secondary | ICD-10-CM | POA: Diagnosis not present

## 2013-02-07 ENCOUNTER — Emergency Department: Payer: Self-pay | Admitting: Emergency Medicine

## 2013-02-07 DIAGNOSIS — K219 Gastro-esophageal reflux disease without esophagitis: Secondary | ICD-10-CM | POA: Diagnosis not present

## 2013-02-07 DIAGNOSIS — Z79899 Other long term (current) drug therapy: Secondary | ICD-10-CM | POA: Diagnosis not present

## 2013-02-07 DIAGNOSIS — I10 Essential (primary) hypertension: Secondary | ICD-10-CM | POA: Diagnosis not present

## 2013-02-07 DIAGNOSIS — R6889 Other general symptoms and signs: Secondary | ICD-10-CM | POA: Diagnosis not present

## 2013-02-07 DIAGNOSIS — K8020 Calculus of gallbladder without cholecystitis without obstruction: Secondary | ICD-10-CM | POA: Diagnosis not present

## 2013-02-07 DIAGNOSIS — R9431 Abnormal electrocardiogram [ECG] [EKG]: Secondary | ICD-10-CM | POA: Diagnosis not present

## 2013-02-07 DIAGNOSIS — G43909 Migraine, unspecified, not intractable, without status migrainosus: Secondary | ICD-10-CM | POA: Diagnosis not present

## 2013-02-07 DIAGNOSIS — R1011 Right upper quadrant pain: Secondary | ICD-10-CM | POA: Diagnosis not present

## 2013-02-07 DIAGNOSIS — N200 Calculus of kidney: Secondary | ICD-10-CM | POA: Diagnosis not present

## 2013-02-07 LAB — CBC
HCT: 40.8 % (ref 35.0–47.0)
HGB: 13.5 g/dL (ref 12.0–16.0)
MCH: 25.7 pg — ABNORMAL LOW (ref 26.0–34.0)
MCHC: 33.2 g/dL (ref 32.0–36.0)
MCV: 78 fL — ABNORMAL LOW (ref 80–100)
Platelet: 217 10*3/uL (ref 150–440)
RBC: 5.26 10*6/uL — ABNORMAL HIGH (ref 3.80–5.20)
RDW: 14 % (ref 11.5–14.5)
WBC: 8.6 10*3/uL (ref 3.6–11.0)

## 2013-02-07 LAB — COMPREHENSIVE METABOLIC PANEL
Albumin: 3.8 g/dL (ref 3.4–5.0)
Alkaline Phosphatase: 89 U/L (ref 50–136)
Anion Gap: 10 (ref 7–16)
BUN: 10 mg/dL (ref 7–18)
Bilirubin,Total: 0.6 mg/dL (ref 0.2–1.0)
Calcium, Total: 9.6 mg/dL (ref 8.5–10.1)
Chloride: 98 mmol/L (ref 98–107)
Co2: 26 mmol/L (ref 21–32)
Creatinine: 0.71 mg/dL (ref 0.60–1.30)
EGFR (African American): >60
EGFR (Non-African Amer.): >60
Glucose: 102 mg/dL — ABNORMAL HIGH (ref 65–99)
Osmolality: 267 (ref 275–301)
Potassium: 3.2 mmol/L — ABNORMAL LOW (ref 3.5–5.1)
SGOT(AST): 10 U/L — ABNORMAL LOW (ref 15–37)
SGPT (ALT): 19 U/L (ref 12–78)
Sodium: 134 mmol/L — ABNORMAL LOW (ref 136–145)
Total Protein: 7.7 g/dL (ref 6.4–8.2)

## 2013-02-07 LAB — URINALYSIS, COMPLETE
Bacteria: NONE SEEN
Bilirubin,UR: NEGATIVE
Glucose,UR: NEGATIVE mg/dL (ref 0–75)
Ketone: NEGATIVE
Leukocyte Esterase: NEGATIVE
Nitrite: NEGATIVE
Ph: 8 (ref 4.5–8.0)
Protein: NEGATIVE
RBC,UR: 2 /HPF (ref 0–5)
Specific Gravity: 1.005 (ref 1.003–1.030)
Squamous Epithelial: 1
Transitional Epi: <1
WBC UR: 1 /HPF (ref 0–5)

## 2013-02-07 LAB — LIPASE, BLOOD: Lipase: 345 U/L (ref 73–393)

## 2013-02-17 ENCOUNTER — Ambulatory Visit: Payer: Self-pay | Admitting: Pain Medicine

## 2013-02-17 DIAGNOSIS — M4716 Other spondylosis with myelopathy, lumbar region: Secondary | ICD-10-CM | POA: Diagnosis not present

## 2013-02-17 DIAGNOSIS — F411 Generalized anxiety disorder: Secondary | ICD-10-CM | POA: Diagnosis not present

## 2013-02-17 DIAGNOSIS — M76899 Other specified enthesopathies of unspecified lower limb, excluding foot: Secondary | ICD-10-CM | POA: Diagnosis not present

## 2013-02-17 DIAGNOSIS — M545 Low back pain, unspecified: Secondary | ICD-10-CM | POA: Diagnosis not present

## 2013-02-17 DIAGNOSIS — M461 Sacroiliitis, not elsewhere classified: Secondary | ICD-10-CM | POA: Diagnosis not present

## 2013-02-17 DIAGNOSIS — I10 Essential (primary) hypertension: Secondary | ICD-10-CM | POA: Diagnosis not present

## 2013-02-17 DIAGNOSIS — Z79899 Other long term (current) drug therapy: Secondary | ICD-10-CM | POA: Diagnosis not present

## 2013-02-17 DIAGNOSIS — IMO0001 Reserved for inherently not codable concepts without codable children: Secondary | ICD-10-CM | POA: Diagnosis not present

## 2013-02-17 DIAGNOSIS — I700 Atherosclerosis of aorta: Secondary | ICD-10-CM | POA: Diagnosis not present

## 2013-02-17 DIAGNOSIS — M79609 Pain in unspecified limb: Secondary | ICD-10-CM | POA: Diagnosis not present

## 2013-02-17 DIAGNOSIS — M899 Disorder of bone, unspecified: Secondary | ICD-10-CM | POA: Diagnosis not present

## 2013-02-17 DIAGNOSIS — M5137 Other intervertebral disc degeneration, lumbosacral region: Secondary | ICD-10-CM | POA: Diagnosis not present

## 2013-02-17 DIAGNOSIS — M169 Osteoarthritis of hip, unspecified: Secondary | ICD-10-CM | POA: Diagnosis not present

## 2013-02-17 DIAGNOSIS — K449 Diaphragmatic hernia without obstruction or gangrene: Secondary | ICD-10-CM | POA: Diagnosis not present

## 2013-02-17 DIAGNOSIS — E039 Hypothyroidism, unspecified: Secondary | ICD-10-CM | POA: Diagnosis not present

## 2013-03-09 DIAGNOSIS — F411 Generalized anxiety disorder: Secondary | ICD-10-CM | POA: Diagnosis not present

## 2013-03-09 DIAGNOSIS — F29 Unspecified psychosis not due to a substance or known physiological condition: Secondary | ICD-10-CM | POA: Diagnosis not present

## 2013-03-09 DIAGNOSIS — G4700 Insomnia, unspecified: Secondary | ICD-10-CM | POA: Diagnosis not present

## 2013-03-09 DIAGNOSIS — N390 Urinary tract infection, site not specified: Secondary | ICD-10-CM | POA: Diagnosis not present

## 2013-03-15 DIAGNOSIS — R5381 Other malaise: Secondary | ICD-10-CM | POA: Diagnosis not present

## 2013-03-15 DIAGNOSIS — I10 Essential (primary) hypertension: Secondary | ICD-10-CM | POA: Diagnosis not present

## 2013-04-02 DIAGNOSIS — H4010X0 Unspecified open-angle glaucoma, stage unspecified: Secondary | ICD-10-CM | POA: Diagnosis not present

## 2013-04-05 DIAGNOSIS — N390 Urinary tract infection, site not specified: Secondary | ICD-10-CM | POA: Diagnosis not present

## 2013-04-28 DIAGNOSIS — E039 Hypothyroidism, unspecified: Secondary | ICD-10-CM | POA: Diagnosis not present

## 2013-04-28 DIAGNOSIS — I10 Essential (primary) hypertension: Secondary | ICD-10-CM | POA: Diagnosis not present

## 2013-04-28 DIAGNOSIS — E7800 Pure hypercholesterolemia, unspecified: Secondary | ICD-10-CM | POA: Diagnosis not present

## 2013-04-28 DIAGNOSIS — F411 Generalized anxiety disorder: Secondary | ICD-10-CM | POA: Diagnosis not present

## 2013-05-14 ENCOUNTER — Ambulatory Visit: Payer: Self-pay | Admitting: Pain Medicine

## 2013-05-14 DIAGNOSIS — M169 Osteoarthritis of hip, unspecified: Secondary | ICD-10-CM | POA: Diagnosis not present

## 2013-05-14 DIAGNOSIS — M4716 Other spondylosis with myelopathy, lumbar region: Secondary | ICD-10-CM | POA: Diagnosis not present

## 2013-05-14 DIAGNOSIS — M545 Low back pain, unspecified: Secondary | ICD-10-CM | POA: Diagnosis not present

## 2013-05-14 DIAGNOSIS — F411 Generalized anxiety disorder: Secondary | ICD-10-CM | POA: Diagnosis not present

## 2013-05-14 DIAGNOSIS — M76899 Other specified enthesopathies of unspecified lower limb, excluding foot: Secondary | ICD-10-CM | POA: Diagnosis not present

## 2013-05-14 DIAGNOSIS — IMO0001 Reserved for inherently not codable concepts without codable children: Secondary | ICD-10-CM | POA: Diagnosis not present

## 2013-05-14 DIAGNOSIS — K449 Diaphragmatic hernia without obstruction or gangrene: Secondary | ICD-10-CM | POA: Diagnosis not present

## 2013-05-14 DIAGNOSIS — E039 Hypothyroidism, unspecified: Secondary | ICD-10-CM | POA: Diagnosis not present

## 2013-05-14 DIAGNOSIS — M899 Disorder of bone, unspecified: Secondary | ICD-10-CM | POA: Diagnosis not present

## 2013-05-14 DIAGNOSIS — M5137 Other intervertebral disc degeneration, lumbosacral region: Secondary | ICD-10-CM | POA: Diagnosis not present

## 2013-05-14 DIAGNOSIS — M461 Sacroiliitis, not elsewhere classified: Secondary | ICD-10-CM | POA: Diagnosis not present

## 2013-05-14 DIAGNOSIS — G8929 Other chronic pain: Secondary | ICD-10-CM | POA: Diagnosis not present

## 2013-05-14 DIAGNOSIS — M79609 Pain in unspecified limb: Secondary | ICD-10-CM | POA: Diagnosis not present

## 2013-05-14 DIAGNOSIS — Z79899 Other long term (current) drug therapy: Secondary | ICD-10-CM | POA: Diagnosis not present

## 2013-05-14 DIAGNOSIS — I10 Essential (primary) hypertension: Secondary | ICD-10-CM | POA: Diagnosis not present

## 2013-05-18 ENCOUNTER — Ambulatory Visit: Payer: Self-pay | Admitting: Pain Medicine

## 2013-05-18 DIAGNOSIS — M169 Osteoarthritis of hip, unspecified: Secondary | ICD-10-CM | POA: Diagnosis not present

## 2013-05-18 DIAGNOSIS — M461 Sacroiliitis, not elsewhere classified: Secondary | ICD-10-CM | POA: Diagnosis not present

## 2013-05-18 DIAGNOSIS — M76899 Other specified enthesopathies of unspecified lower limb, excluding foot: Secondary | ICD-10-CM | POA: Diagnosis not present

## 2013-05-21 DIAGNOSIS — R809 Proteinuria, unspecified: Secondary | ICD-10-CM | POA: Diagnosis not present

## 2013-05-21 DIAGNOSIS — E871 Hypo-osmolality and hyponatremia: Secondary | ICD-10-CM | POA: Diagnosis not present

## 2013-05-21 DIAGNOSIS — I10 Essential (primary) hypertension: Secondary | ICD-10-CM | POA: Diagnosis not present

## 2013-05-21 DIAGNOSIS — N390 Urinary tract infection, site not specified: Secondary | ICD-10-CM | POA: Diagnosis not present

## 2013-05-21 DIAGNOSIS — R319 Hematuria, unspecified: Secondary | ICD-10-CM | POA: Diagnosis not present

## 2013-06-02 DIAGNOSIS — F411 Generalized anxiety disorder: Secondary | ICD-10-CM | POA: Diagnosis not present

## 2013-06-02 DIAGNOSIS — I10 Essential (primary) hypertension: Secondary | ICD-10-CM | POA: Diagnosis not present

## 2013-06-02 DIAGNOSIS — E7800 Pure hypercholesterolemia, unspecified: Secondary | ICD-10-CM | POA: Diagnosis not present

## 2013-06-02 DIAGNOSIS — E039 Hypothyroidism, unspecified: Secondary | ICD-10-CM | POA: Diagnosis not present

## 2013-06-12 DIAGNOSIS — H1030 Unspecified acute conjunctivitis, unspecified eye: Secondary | ICD-10-CM | POA: Diagnosis not present

## 2013-06-16 ENCOUNTER — Ambulatory Visit: Payer: Self-pay | Admitting: Pain Medicine

## 2013-06-16 DIAGNOSIS — Z79899 Other long term (current) drug therapy: Secondary | ICD-10-CM | POA: Diagnosis not present

## 2013-06-16 DIAGNOSIS — G8929 Other chronic pain: Secondary | ICD-10-CM | POA: Diagnosis not present

## 2013-06-16 DIAGNOSIS — M545 Low back pain, unspecified: Secondary | ICD-10-CM | POA: Diagnosis not present

## 2013-06-16 DIAGNOSIS — E039 Hypothyroidism, unspecified: Secondary | ICD-10-CM | POA: Diagnosis not present

## 2013-06-16 DIAGNOSIS — M461 Sacroiliitis, not elsewhere classified: Secondary | ICD-10-CM | POA: Diagnosis not present

## 2013-06-16 DIAGNOSIS — M79609 Pain in unspecified limb: Secondary | ICD-10-CM | POA: Diagnosis not present

## 2013-06-16 DIAGNOSIS — F411 Generalized anxiety disorder: Secondary | ICD-10-CM | POA: Diagnosis not present

## 2013-06-16 DIAGNOSIS — I10 Essential (primary) hypertension: Secondary | ICD-10-CM | POA: Diagnosis not present

## 2013-06-16 DIAGNOSIS — M76899 Other specified enthesopathies of unspecified lower limb, excluding foot: Secondary | ICD-10-CM | POA: Diagnosis not present

## 2013-06-16 DIAGNOSIS — M169 Osteoarthritis of hip, unspecified: Secondary | ICD-10-CM | POA: Diagnosis not present

## 2013-06-16 DIAGNOSIS — K449 Diaphragmatic hernia without obstruction or gangrene: Secondary | ICD-10-CM | POA: Diagnosis not present

## 2013-06-16 DIAGNOSIS — Q762 Congenital spondylolisthesis: Secondary | ICD-10-CM | POA: Diagnosis not present

## 2013-06-16 DIAGNOSIS — M899 Disorder of bone, unspecified: Secondary | ICD-10-CM | POA: Diagnosis not present

## 2013-06-16 DIAGNOSIS — M4716 Other spondylosis with myelopathy, lumbar region: Secondary | ICD-10-CM | POA: Diagnosis not present

## 2013-06-16 DIAGNOSIS — IMO0001 Reserved for inherently not codable concepts without codable children: Secondary | ICD-10-CM | POA: Diagnosis not present

## 2013-06-16 DIAGNOSIS — M5137 Other intervertebral disc degeneration, lumbosacral region: Secondary | ICD-10-CM | POA: Diagnosis not present

## 2013-06-18 DIAGNOSIS — H16219 Exposure keratoconjunctivitis, unspecified eye: Secondary | ICD-10-CM | POA: Diagnosis not present

## 2013-07-02 DIAGNOSIS — H16219 Exposure keratoconjunctivitis, unspecified eye: Secondary | ICD-10-CM | POA: Diagnosis not present

## 2013-07-06 DIAGNOSIS — I10 Essential (primary) hypertension: Secondary | ICD-10-CM | POA: Diagnosis not present

## 2013-07-06 DIAGNOSIS — N390 Urinary tract infection, site not specified: Secondary | ICD-10-CM | POA: Diagnosis not present

## 2013-07-06 DIAGNOSIS — E871 Hypo-osmolality and hyponatremia: Secondary | ICD-10-CM | POA: Diagnosis not present

## 2013-07-07 DIAGNOSIS — E7800 Pure hypercholesterolemia, unspecified: Secondary | ICD-10-CM | POA: Diagnosis not present

## 2013-07-07 DIAGNOSIS — F411 Generalized anxiety disorder: Secondary | ICD-10-CM | POA: Diagnosis not present

## 2013-07-07 DIAGNOSIS — I10 Essential (primary) hypertension: Secondary | ICD-10-CM | POA: Diagnosis not present

## 2013-07-07 DIAGNOSIS — E039 Hypothyroidism, unspecified: Secondary | ICD-10-CM | POA: Diagnosis not present

## 2013-09-17 DIAGNOSIS — E039 Hypothyroidism, unspecified: Secondary | ICD-10-CM | POA: Diagnosis not present

## 2013-09-17 DIAGNOSIS — I10 Essential (primary) hypertension: Secondary | ICD-10-CM | POA: Diagnosis not present

## 2013-09-17 DIAGNOSIS — F411 Generalized anxiety disorder: Secondary | ICD-10-CM | POA: Diagnosis not present

## 2013-09-17 DIAGNOSIS — N289 Disorder of kidney and ureter, unspecified: Secondary | ICD-10-CM | POA: Diagnosis not present

## 2013-10-22 ENCOUNTER — Ambulatory Visit: Payer: Self-pay | Admitting: Pain Medicine

## 2013-10-22 DIAGNOSIS — M545 Low back pain, unspecified: Secondary | ICD-10-CM | POA: Diagnosis not present

## 2013-10-22 DIAGNOSIS — M169 Osteoarthritis of hip, unspecified: Secondary | ICD-10-CM | POA: Diagnosis not present

## 2013-10-22 DIAGNOSIS — I10 Essential (primary) hypertension: Secondary | ICD-10-CM | POA: Diagnosis not present

## 2013-10-22 DIAGNOSIS — Z79899 Other long term (current) drug therapy: Secondary | ICD-10-CM | POA: Diagnosis not present

## 2013-10-22 DIAGNOSIS — M461 Sacroiliitis, not elsewhere classified: Secondary | ICD-10-CM | POA: Diagnosis not present

## 2013-10-22 DIAGNOSIS — G8929 Other chronic pain: Secondary | ICD-10-CM | POA: Diagnosis not present

## 2013-10-22 DIAGNOSIS — G894 Chronic pain syndrome: Secondary | ICD-10-CM | POA: Diagnosis not present

## 2013-10-22 DIAGNOSIS — M76899 Other specified enthesopathies of unspecified lower limb, excluding foot: Secondary | ICD-10-CM | POA: Diagnosis not present

## 2013-10-22 DIAGNOSIS — Z5181 Encounter for therapeutic drug level monitoring: Secondary | ICD-10-CM | POA: Diagnosis not present

## 2013-10-22 DIAGNOSIS — M899 Disorder of bone, unspecified: Secondary | ICD-10-CM | POA: Diagnosis not present

## 2013-10-22 DIAGNOSIS — F411 Generalized anxiety disorder: Secondary | ICD-10-CM | POA: Diagnosis not present

## 2013-10-22 DIAGNOSIS — K449 Diaphragmatic hernia without obstruction or gangrene: Secondary | ICD-10-CM | POA: Diagnosis not present

## 2013-10-22 DIAGNOSIS — M4716 Other spondylosis with myelopathy, lumbar region: Secondary | ICD-10-CM | POA: Diagnosis not present

## 2013-10-22 DIAGNOSIS — IMO0001 Reserved for inherently not codable concepts without codable children: Secondary | ICD-10-CM | POA: Diagnosis not present

## 2013-10-22 DIAGNOSIS — M1610 Unilateral primary osteoarthritis, unspecified hip: Secondary | ICD-10-CM | POA: Diagnosis not present

## 2013-10-22 DIAGNOSIS — M79609 Pain in unspecified limb: Secondary | ICD-10-CM | POA: Diagnosis not present

## 2013-10-22 DIAGNOSIS — M5137 Other intervertebral disc degeneration, lumbosacral region: Secondary | ICD-10-CM | POA: Diagnosis not present

## 2013-10-22 DIAGNOSIS — E039 Hypothyroidism, unspecified: Secondary | ICD-10-CM | POA: Diagnosis not present

## 2013-10-22 DIAGNOSIS — I700 Atherosclerosis of aorta: Secondary | ICD-10-CM | POA: Diagnosis not present

## 2013-11-19 DIAGNOSIS — H4010X0 Unspecified open-angle glaucoma, stage unspecified: Secondary | ICD-10-CM | POA: Diagnosis not present

## 2013-12-01 DIAGNOSIS — N289 Disorder of kidney and ureter, unspecified: Secondary | ICD-10-CM | POA: Diagnosis not present

## 2013-12-01 DIAGNOSIS — E039 Hypothyroidism, unspecified: Secondary | ICD-10-CM | POA: Diagnosis not present

## 2013-12-07 DIAGNOSIS — K219 Gastro-esophageal reflux disease without esophagitis: Secondary | ICD-10-CM | POA: Diagnosis not present

## 2013-12-07 DIAGNOSIS — E039 Hypothyroidism, unspecified: Secondary | ICD-10-CM | POA: Diagnosis not present

## 2013-12-07 DIAGNOSIS — I10 Essential (primary) hypertension: Secondary | ICD-10-CM | POA: Diagnosis not present

## 2013-12-07 DIAGNOSIS — F411 Generalized anxiety disorder: Secondary | ICD-10-CM | POA: Diagnosis not present

## 2014-01-06 DIAGNOSIS — I10 Essential (primary) hypertension: Secondary | ICD-10-CM | POA: Diagnosis not present

## 2014-01-06 DIAGNOSIS — E871 Hypo-osmolality and hyponatremia: Secondary | ICD-10-CM | POA: Diagnosis not present

## 2014-01-06 DIAGNOSIS — N183 Chronic kidney disease, stage 3 unspecified: Secondary | ICD-10-CM | POA: Diagnosis not present

## 2014-01-14 ENCOUNTER — Other Ambulatory Visit: Payer: Self-pay | Admitting: Pain Medicine

## 2014-01-14 DIAGNOSIS — G894 Chronic pain syndrome: Secondary | ICD-10-CM | POA: Diagnosis not present

## 2014-01-14 LAB — BASIC METABOLIC PANEL
Anion Gap: 4 — ABNORMAL LOW (ref 7–16)
BUN: 8 mg/dL (ref 7–18)
Calcium, Total: 9.1 mg/dL (ref 8.5–10.1)
Chloride: 100 mmol/L (ref 98–107)
Co2: 29 mmol/L (ref 21–32)
Creatinine: 0.84 mg/dL (ref 0.60–1.30)
EGFR (African American): >60
EGFR (Non-African Amer.): >60
Glucose: 94 mg/dL (ref 65–99)
Osmolality: 264 (ref 275–301)
Potassium: 4.2 mmol/L (ref 3.5–5.1)
Sodium: 133 mmol/L — ABNORMAL LOW (ref 136–145)

## 2014-01-14 LAB — HEPATIC FUNCTION PANEL A (ARMC)
Albumin: 3.5 g/dL (ref 3.4–5.0)
Alkaline Phosphatase: 74 U/L
Bilirubin, Direct: <0.1 mg/dL (ref 0.00–0.20)
Bilirubin,Total: 0.5 mg/dL (ref 0.2–1.0)
SGOT(AST): 32 U/L (ref 15–37)
SGPT (ALT): 16 U/L (ref 12–78)
Total Protein: 6.8 g/dL (ref 6.4–8.2)

## 2014-01-14 LAB — MAGNESIUM: Magnesium: 2.1 mg/dL

## 2014-01-21 ENCOUNTER — Ambulatory Visit: Payer: Self-pay | Admitting: Pain Medicine

## 2014-01-21 DIAGNOSIS — M79609 Pain in unspecified limb: Secondary | ICD-10-CM | POA: Diagnosis not present

## 2014-01-21 DIAGNOSIS — M899 Disorder of bone, unspecified: Secondary | ICD-10-CM | POA: Diagnosis not present

## 2014-01-21 DIAGNOSIS — M461 Sacroiliitis, not elsewhere classified: Secondary | ICD-10-CM | POA: Diagnosis not present

## 2014-01-21 DIAGNOSIS — Z79899 Other long term (current) drug therapy: Secondary | ICD-10-CM | POA: Diagnosis not present

## 2014-01-21 DIAGNOSIS — M169 Osteoarthritis of hip, unspecified: Secondary | ICD-10-CM | POA: Diagnosis not present

## 2014-01-21 DIAGNOSIS — E559 Vitamin D deficiency, unspecified: Secondary | ICD-10-CM | POA: Diagnosis not present

## 2014-01-21 DIAGNOSIS — E039 Hypothyroidism, unspecified: Secondary | ICD-10-CM | POA: Diagnosis not present

## 2014-01-21 DIAGNOSIS — M76899 Other specified enthesopathies of unspecified lower limb, excluding foot: Secondary | ICD-10-CM | POA: Diagnosis not present

## 2014-01-21 DIAGNOSIS — K449 Diaphragmatic hernia without obstruction or gangrene: Secondary | ICD-10-CM | POA: Diagnosis not present

## 2014-01-21 DIAGNOSIS — G8929 Other chronic pain: Secondary | ICD-10-CM | POA: Diagnosis not present

## 2014-01-21 DIAGNOSIS — I10 Essential (primary) hypertension: Secondary | ICD-10-CM | POA: Diagnosis not present

## 2014-01-21 DIAGNOSIS — M1610 Unilateral primary osteoarthritis, unspecified hip: Secondary | ICD-10-CM | POA: Diagnosis not present

## 2014-01-21 DIAGNOSIS — M5137 Other intervertebral disc degeneration, lumbosacral region: Secondary | ICD-10-CM | POA: Diagnosis not present

## 2014-01-21 DIAGNOSIS — IMO0001 Reserved for inherently not codable concepts without codable children: Secondary | ICD-10-CM | POA: Diagnosis not present

## 2014-01-21 DIAGNOSIS — M4716 Other spondylosis with myelopathy, lumbar region: Secondary | ICD-10-CM | POA: Diagnosis not present

## 2014-01-21 DIAGNOSIS — M545 Low back pain, unspecified: Secondary | ICD-10-CM | POA: Diagnosis not present

## 2014-01-21 DIAGNOSIS — F411 Generalized anxiety disorder: Secondary | ICD-10-CM | POA: Diagnosis not present

## 2014-02-01 ENCOUNTER — Ambulatory Visit: Payer: Self-pay | Admitting: Pain Medicine

## 2014-02-01 DIAGNOSIS — M76899 Other specified enthesopathies of unspecified lower limb, excluding foot: Secondary | ICD-10-CM | POA: Diagnosis not present

## 2014-02-01 DIAGNOSIS — M545 Low back pain, unspecified: Secondary | ICD-10-CM | POA: Diagnosis not present

## 2014-02-01 DIAGNOSIS — IMO0001 Reserved for inherently not codable concepts without codable children: Secondary | ICD-10-CM | POA: Diagnosis not present

## 2014-02-01 DIAGNOSIS — M169 Osteoarthritis of hip, unspecified: Secondary | ICD-10-CM | POA: Diagnosis not present

## 2014-02-01 DIAGNOSIS — M79609 Pain in unspecified limb: Secondary | ICD-10-CM | POA: Diagnosis not present

## 2014-02-01 DIAGNOSIS — M461 Sacroiliitis, not elsewhere classified: Secondary | ICD-10-CM | POA: Diagnosis not present

## 2014-02-01 DIAGNOSIS — M1610 Unilateral primary osteoarthritis, unspecified hip: Secondary | ICD-10-CM | POA: Diagnosis not present

## 2014-02-12 ENCOUNTER — Emergency Department: Payer: Self-pay | Admitting: Internal Medicine

## 2014-02-12 DIAGNOSIS — M545 Low back pain, unspecified: Secondary | ICD-10-CM | POA: Diagnosis not present

## 2014-02-12 DIAGNOSIS — Z79899 Other long term (current) drug therapy: Secondary | ICD-10-CM | POA: Diagnosis not present

## 2014-02-12 DIAGNOSIS — K219 Gastro-esophageal reflux disease without esophagitis: Secondary | ICD-10-CM | POA: Diagnosis not present

## 2014-02-12 DIAGNOSIS — M549 Dorsalgia, unspecified: Secondary | ICD-10-CM | POA: Diagnosis not present

## 2014-02-12 DIAGNOSIS — Z87442 Personal history of urinary calculi: Secondary | ICD-10-CM | POA: Diagnosis not present

## 2014-02-12 DIAGNOSIS — R109 Unspecified abdominal pain: Secondary | ICD-10-CM | POA: Diagnosis not present

## 2014-02-12 DIAGNOSIS — K8020 Calculus of gallbladder without cholecystitis without obstruction: Secondary | ICD-10-CM | POA: Diagnosis not present

## 2014-02-12 DIAGNOSIS — E039 Hypothyroidism, unspecified: Secondary | ICD-10-CM | POA: Diagnosis not present

## 2014-02-12 DIAGNOSIS — I10 Essential (primary) hypertension: Secondary | ICD-10-CM | POA: Diagnosis not present

## 2014-02-12 LAB — CBC
HCT: 36.9 % (ref 35.0–47.0)
HGB: 11.7 g/dL — ABNORMAL LOW (ref 12.0–16.0)
MCH: 25.4 pg — ABNORMAL LOW (ref 26.0–34.0)
MCHC: 31.6 g/dL — ABNORMAL LOW (ref 32.0–36.0)
MCV: 80 fL (ref 80–100)
Platelet: 157 10*3/uL (ref 150–440)
RBC: 4.59 10*6/uL (ref 3.80–5.20)
RDW: 14.6 % — ABNORMAL HIGH (ref 11.5–14.5)
WBC: 5.4 10*3/uL (ref 3.6–11.0)

## 2014-02-12 LAB — COMPREHENSIVE METABOLIC PANEL
Albumin: 3.2 g/dL — ABNORMAL LOW (ref 3.4–5.0)
Alkaline Phosphatase: 75 U/L
Anion Gap: 10 (ref 7–16)
BUN: 12 mg/dL (ref 7–18)
Bilirubin,Total: 0.4 mg/dL (ref 0.2–1.0)
Calcium, Total: 8.6 mg/dL (ref 8.5–10.1)
Chloride: 102 mmol/L (ref 98–107)
Co2: 23 mmol/L (ref 21–32)
Creatinine: 0.8 mg/dL (ref 0.60–1.30)
EGFR (African American): >60
EGFR (Non-African Amer.): >60
Glucose: 104 mg/dL — ABNORMAL HIGH (ref 65–99)
Osmolality: 270 (ref 275–301)
Potassium: 4 mmol/L (ref 3.5–5.1)
SGOT(AST): 19 U/L (ref 15–37)
SGPT (ALT): 18 U/L (ref 12–78)
Sodium: 135 mmol/L — ABNORMAL LOW (ref 136–145)
Total Protein: 6.8 g/dL (ref 6.4–8.2)

## 2014-02-12 LAB — LIPASE, BLOOD: Lipase: 121 U/L (ref 73–393)

## 2014-02-12 LAB — URINALYSIS, COMPLETE
Bacteria: NONE SEEN
Bilirubin,UR: NEGATIVE
Blood: NEGATIVE
Glucose,UR: NEGATIVE mg/dL (ref 0–75)
Ketone: NEGATIVE
Leukocyte Esterase: NEGATIVE
Nitrite: NEGATIVE
Ph: 6 (ref 4.5–8.0)
Protein: NEGATIVE
RBC,UR: 1 /HPF (ref 0–5)
Specific Gravity: 1.009 (ref 1.003–1.030)
Squamous Epithelial: NONE SEEN
WBC UR: NONE SEEN /HPF (ref 0–5)

## 2014-02-12 LAB — TROPONIN I: Troponin-I: <0.02 ng/mL

## 2014-02-21 ENCOUNTER — Ambulatory Visit: Payer: Self-pay | Admitting: Pain Medicine

## 2014-02-21 DIAGNOSIS — K449 Diaphragmatic hernia without obstruction or gangrene: Secondary | ICD-10-CM | POA: Diagnosis not present

## 2014-02-21 DIAGNOSIS — M5137 Other intervertebral disc degeneration, lumbosacral region: Secondary | ICD-10-CM | POA: Diagnosis not present

## 2014-02-21 DIAGNOSIS — I714 Abdominal aortic aneurysm, without rupture, unspecified: Secondary | ICD-10-CM | POA: Diagnosis not present

## 2014-02-21 DIAGNOSIS — M169 Osteoarthritis of hip, unspecified: Secondary | ICD-10-CM | POA: Diagnosis not present

## 2014-02-21 DIAGNOSIS — F411 Generalized anxiety disorder: Secondary | ICD-10-CM | POA: Diagnosis not present

## 2014-02-21 DIAGNOSIS — I10 Essential (primary) hypertension: Secondary | ICD-10-CM | POA: Diagnosis not present

## 2014-02-21 DIAGNOSIS — M461 Sacroiliitis, not elsewhere classified: Secondary | ICD-10-CM | POA: Diagnosis not present

## 2014-02-21 DIAGNOSIS — M899 Disorder of bone, unspecified: Secondary | ICD-10-CM | POA: Diagnosis not present

## 2014-02-21 DIAGNOSIS — M76899 Other specified enthesopathies of unspecified lower limb, excluding foot: Secondary | ICD-10-CM | POA: Diagnosis not present

## 2014-02-21 DIAGNOSIS — M545 Low back pain, unspecified: Secondary | ICD-10-CM | POA: Diagnosis not present

## 2014-02-21 DIAGNOSIS — M79609 Pain in unspecified limb: Secondary | ICD-10-CM | POA: Diagnosis not present

## 2014-02-21 DIAGNOSIS — G8929 Other chronic pain: Secondary | ICD-10-CM | POA: Diagnosis not present

## 2014-02-21 DIAGNOSIS — Z79899 Other long term (current) drug therapy: Secondary | ICD-10-CM | POA: Diagnosis not present

## 2014-02-21 DIAGNOSIS — E039 Hypothyroidism, unspecified: Secondary | ICD-10-CM | POA: Diagnosis not present

## 2014-02-21 DIAGNOSIS — G894 Chronic pain syndrome: Secondary | ICD-10-CM | POA: Diagnosis not present

## 2014-02-21 DIAGNOSIS — M1610 Unilateral primary osteoarthritis, unspecified hip: Secondary | ICD-10-CM | POA: Diagnosis not present

## 2014-02-21 DIAGNOSIS — M4716 Other spondylosis with myelopathy, lumbar region: Secondary | ICD-10-CM | POA: Diagnosis not present

## 2014-03-01 ENCOUNTER — Ambulatory Visit: Payer: Self-pay | Admitting: Pain Medicine

## 2014-03-01 DIAGNOSIS — M47817 Spondylosis without myelopathy or radiculopathy, lumbosacral region: Secondary | ICD-10-CM | POA: Diagnosis not present

## 2014-03-01 DIAGNOSIS — IMO0002 Reserved for concepts with insufficient information to code with codable children: Secondary | ICD-10-CM | POA: Diagnosis not present

## 2014-04-12 DIAGNOSIS — F411 Generalized anxiety disorder: Secondary | ICD-10-CM | POA: Diagnosis not present

## 2014-04-12 DIAGNOSIS — Z23 Encounter for immunization: Secondary | ICD-10-CM | POA: Diagnosis not present

## 2014-04-12 DIAGNOSIS — I10 Essential (primary) hypertension: Secondary | ICD-10-CM | POA: Diagnosis not present

## 2014-04-12 DIAGNOSIS — E039 Hypothyroidism, unspecified: Secondary | ICD-10-CM | POA: Diagnosis not present

## 2014-04-12 DIAGNOSIS — K219 Gastro-esophageal reflux disease without esophagitis: Secondary | ICD-10-CM | POA: Diagnosis not present

## 2014-04-29 DIAGNOSIS — H4011X3 Primary open-angle glaucoma, severe stage: Secondary | ICD-10-CM | POA: Diagnosis not present

## 2014-05-06 ENCOUNTER — Ambulatory Visit: Payer: Self-pay | Admitting: Pain Medicine

## 2014-05-06 DIAGNOSIS — M25551 Pain in right hip: Secondary | ICD-10-CM | POA: Diagnosis not present

## 2014-05-06 DIAGNOSIS — M5416 Radiculopathy, lumbar region: Secondary | ICD-10-CM | POA: Diagnosis not present

## 2014-05-06 DIAGNOSIS — M47817 Spondylosis without myelopathy or radiculopathy, lumbosacral region: Secondary | ICD-10-CM | POA: Diagnosis not present

## 2014-05-06 DIAGNOSIS — G894 Chronic pain syndrome: Secondary | ICD-10-CM | POA: Diagnosis not present

## 2014-05-06 DIAGNOSIS — M545 Low back pain: Secondary | ICD-10-CM | POA: Diagnosis not present

## 2014-07-01 DIAGNOSIS — E871 Hypo-osmolality and hyponatremia: Secondary | ICD-10-CM | POA: Diagnosis not present

## 2014-07-01 DIAGNOSIS — N390 Urinary tract infection, site not specified: Secondary | ICD-10-CM | POA: Diagnosis not present

## 2014-07-01 DIAGNOSIS — N183 Chronic kidney disease, stage 3 (moderate): Secondary | ICD-10-CM | POA: Diagnosis not present

## 2014-07-01 DIAGNOSIS — I10 Essential (primary) hypertension: Secondary | ICD-10-CM | POA: Diagnosis not present

## 2014-07-01 DIAGNOSIS — R600 Localized edema: Secondary | ICD-10-CM | POA: Diagnosis not present

## 2014-07-08 ENCOUNTER — Emergency Department: Payer: Self-pay | Admitting: Emergency Medicine

## 2014-07-08 DIAGNOSIS — R1032 Left lower quadrant pain: Secondary | ICD-10-CM | POA: Diagnosis not present

## 2014-07-08 DIAGNOSIS — K5900 Constipation, unspecified: Secondary | ICD-10-CM | POA: Diagnosis not present

## 2014-07-08 DIAGNOSIS — I10 Essential (primary) hypertension: Secondary | ICD-10-CM | POA: Diagnosis not present

## 2014-07-08 DIAGNOSIS — Z79899 Other long term (current) drug therapy: Secondary | ICD-10-CM | POA: Diagnosis not present

## 2014-07-08 DIAGNOSIS — K8020 Calculus of gallbladder without cholecystitis without obstruction: Secondary | ICD-10-CM | POA: Diagnosis not present

## 2014-07-08 DIAGNOSIS — R109 Unspecified abdominal pain: Secondary | ICD-10-CM | POA: Diagnosis not present

## 2014-07-08 LAB — CBC WITH DIFFERENTIAL/PLATELET
Basophil #: 0 10*3/uL (ref 0.0–0.1)
Basophil %: 0.7 %
Eosinophil #: 0 10*3/uL (ref 0.0–0.7)
Eosinophil %: 0.3 %
HCT: 39.5 % (ref 35.0–47.0)
HGB: 12.5 g/dL (ref 12.0–16.0)
Lymphocyte #: 1 10*3/uL (ref 1.0–3.6)
Lymphocyte %: 22.8 %
MCH: 25.5 pg — ABNORMAL LOW (ref 26.0–34.0)
MCHC: 31.7 g/dL — ABNORMAL LOW (ref 32.0–36.0)
MCV: 80 fL (ref 80–100)
Monocyte #: 0.4 x10 3/mm (ref 0.2–0.9)
Monocyte %: 9.1 %
Neutrophil #: 3 10*3/uL (ref 1.4–6.5)
Neutrophil %: 67.1 %
Platelet: 177 10*3/uL (ref 150–440)
RBC: 4.91 10*6/uL (ref 3.80–5.20)
RDW: 14.4 % (ref 11.5–14.5)
WBC: 4.4 10*3/uL (ref 3.6–11.0)

## 2014-07-08 LAB — COMPREHENSIVE METABOLIC PANEL
Albumin: 3.4 g/dL (ref 3.4–5.0)
Alkaline Phosphatase: 74 U/L
Anion Gap: 3 — ABNORMAL LOW (ref 7–16)
BUN: 7 mg/dL (ref 7–18)
Bilirubin,Total: 0.4 mg/dL (ref 0.2–1.0)
Calcium, Total: 9.1 mg/dL (ref 8.5–10.1)
Chloride: 102 mmol/L (ref 98–107)
Co2: 31 mmol/L (ref 21–32)
Creatinine: 0.72 mg/dL (ref 0.60–1.30)
EGFR (African American): >60
EGFR (Non-African Amer.): >60
Glucose: 116 mg/dL — ABNORMAL HIGH (ref 65–99)
Osmolality: 271 (ref 275–301)
Potassium: 4 mmol/L (ref 3.5–5.1)
SGOT(AST): 26 U/L (ref 15–37)
SGPT (ALT): 25 U/L
Sodium: 136 mmol/L (ref 136–145)
Total Protein: 7.4 g/dL (ref 6.4–8.2)

## 2014-07-08 LAB — URINALYSIS, COMPLETE
Bacteria: NONE SEEN
Bilirubin,UR: NEGATIVE
Blood: NEGATIVE
Glucose,UR: NEGATIVE mg/dL (ref 0–75)
Ketone: NEGATIVE
Leukocyte Esterase: NEGATIVE
Nitrite: NEGATIVE
Ph: 8 (ref 4.5–8.0)
Protein: NEGATIVE
RBC,UR: <1 /HPF (ref 0–5)
Specific Gravity: 1.003 (ref 1.003–1.030)
Squamous Epithelial: <1
WBC UR: NONE SEEN /HPF (ref 0–5)

## 2014-09-06 DIAGNOSIS — Z79899 Other long term (current) drug therapy: Secondary | ICD-10-CM | POA: Diagnosis not present

## 2014-10-19 DIAGNOSIS — G4700 Insomnia, unspecified: Secondary | ICD-10-CM | POA: Diagnosis not present

## 2014-10-19 DIAGNOSIS — E780 Pure hypercholesterolemia: Secondary | ICD-10-CM | POA: Diagnosis not present

## 2014-10-19 DIAGNOSIS — K219 Gastro-esophageal reflux disease without esophagitis: Secondary | ICD-10-CM | POA: Diagnosis not present

## 2014-10-19 DIAGNOSIS — E039 Hypothyroidism, unspecified: Secondary | ICD-10-CM | POA: Diagnosis not present

## 2014-10-19 DIAGNOSIS — F419 Anxiety disorder, unspecified: Secondary | ICD-10-CM | POA: Diagnosis not present

## 2014-10-19 DIAGNOSIS — N289 Disorder of kidney and ureter, unspecified: Secondary | ICD-10-CM | POA: Diagnosis not present

## 2014-10-19 DIAGNOSIS — R51 Headache: Secondary | ICD-10-CM | POA: Diagnosis not present

## 2014-10-19 DIAGNOSIS — I10 Essential (primary) hypertension: Secondary | ICD-10-CM | POA: Diagnosis not present

## 2014-10-28 DIAGNOSIS — H4011X3 Primary open-angle glaucoma, severe stage: Secondary | ICD-10-CM | POA: Diagnosis not present

## 2014-11-15 NOTE — H&P (Signed)
PATIENT NAME:  Brittney Simpson, Brittney Simpson MR#:  [id number] DATE OF BIRTH:  [birth year]  DATE OF ADMISSION:  04/22/2012  PRIMARY CARE PHYSICIAN: Larene Beach, MD  The case was discussed with the ER physician, Dr. Robet Leu. Old records reviewed. History obtained from the patient and her family at bedside. EKG and imaging studies personally reviewed.   CHIEF COMPLAINT: Dizziness, nausea, and left/shoulder blade pain.   HISTORY OF PRESENTING ILLNESS: The patient is an 81 year old Caucasian female patient with history of hypertension, hyperlipidemia, chronic back pain, and right hip pain along with degenerative joint disease who presents to the Emergency Room complaining of nausea, dizziness and left shoulder blade pain which was acute in onset at 9:00 a.m. This lasted about 3-1/2 hours and resolved on its own. She did not have any palpitations or shortness of breath. The pain resolved by the time the patient arrived at the emergency room. The patient had a T7 compression fracture status post kyphoplasty in December 2012 and has not had any pain in her shoulder or upper back since then. She does have chronic low back pain. Her EKG shows no acute abnormalities, cardiac enzymes are normal, and the patient is being admitted for anginal equivalent to rule out acute coronary syndrome to the hospitalist service.   The patient was recently treated as an outpatient for a urinary tract infection and finished her course of antibiotics yesterday.   ALLERGIES: No known drug allergies.   PAST MEDICAL HISTORY:  1. Migraines. 2. Degenerative joint disease. 3. Anxiety. 4. Hypertension. 5. Hyperlipidemia. 6. Hypothyroidism. 7. Glaucoma. 8. Chronic back pain and right hip pain, managed by the [facility].  9. T7 compression fracture status post kyphoplasty.   PAST SURGICAL HISTORY:  1. Kyphoplasty. 2. Hemorrhoidectomy. 3. Hernia repair. 4. Dilation and curettage.    HOME MEDICATIONS:  1. Acetaminophen/caffeine one  tablet oral two times a day as needed for headache.  2. Amlodipine 10 mg oral once a day.  3. Calcium and vitamin D 600/200 international units one capsule oral once a day.  4. Brimonidine 0.1% ophthalmic solution one drop in each eye twice a day.  5. Diazepam 1 tablet oral twice a day, dosage unknown.  6. Hydroxyzine 25 mg oral three times daily. 7. Hydrochlorothiazide/lisinopril 12.5/20 mg two tablets oral once a day.  8. Levothyroxine 125 mcg oral once a day.  9. Lipitor 40 mg oral once a day.  10. Metoprolol succinate 100 mg oral twice a day. 11. Timolol 0.5% ophthalmic solution one drop to each affected eye twice a day.   SOCIAL HISTORY: The patient does not smoke, no alcohol and no illicit drugs. She lives alone and ambulates on her own. She is retired from the [employer].   CODE STATUS: FULL CODE.   FAMILY HISTORY: Father died of lung cancer. Mother died of CVA. Her brother had a massive heart attack, although not premature.   REVIEW OF SYSTEMS: CONSTITUTIONAL: The patient denies any weight gain or weight loss. She did have some fatigue with urinary tract infection, but now resolved. EYES: No blurred vision or double vision. Has glaucoma. ENT: No tinnitus, ear pain, or nasal congestion. RESPIRATORY: No shortness of breath, wheezing, or hemoptysis. CARDIOVASCULAR: No chest pain or palpitations. Did have dizziness. GI: Had nausea. No vomiting, no diarrhea, and no abdominal pain. MUSCULOSKELETAL: Has chronic back pain and right hip pain. No other joint pain or muscle pains. NEUROLOGIC: No focal weakness, numbness or dysarthria. ENDOCRINE: Has hypothyroidism. No polyuria or polydipsia. HEME/LYMPHATIC: No anemia,  easy bruising or bleeding. PSYCH: No anxiety or depression.   PHYSICAL EXAMINATION:   VITAL SIGNS: Temperature 98.3, pulse 70, respirations 20, blood pressure 178/65, and saturating 96% on room air.   GENERAL: Elderly Caucasian female patient lying in bed comfortable,  conversational, cooperative with exam, has decreased hearing.   PSYCHIATRIC: Alert and oriented x3. Mood and affect appropriate. Judgment intact.   HEENT: Atraumatic, normocephalic. Oral mucosa moist and pink. External ears and nose normal. No pallor. No icterus. Pupils bilaterally equal and reactive to light.   NECK: Supple. No thyromegaly. No palpable lymph nodes. Trachea midline. No carotid bruit or JVD.   CARDIOVASCULAR: S1 and S2 regular rate and rhythm without any murmurs.   RESPIRATORY: Normal work of breathing. Clear to auscultation on both sides.   GASTROINTESTINAL: Soft abdomen, nontender. Bowel sounds present. No hepatosplenomegaly palpable.   SKIN: Warm and dry. No petechiae, rash, or ulcers.   MUSCULOSKELETAL: No joint swelling, redness, or effusion of the large joints. Normal muscle tone. No tenderness on her spine, shoulder, or chest.   NEUROLOGICAL: Motor strength 5/5 in upper and lower extremities. Sensation to fine touch intact all over.   LYMPHATIC: No cervical lymphadenopathy.  LABORATORY, DIAGNOSTIC AND RADIOLOGIC DATA: Glucose is 123, BUN 13, creatinine 0.73, sodium 126, potassium 4, chloride 96, and GFR greater than 60. AST, ALT, and alkaline phosphatase normal. Troponin less than 0.02. CK is 67 with MB normal.   Urinalysis shows 3 WBCs and trace bacteria.   WBC 6.1 and hemoglobin 14.7.   EKG shows normal sinus rhythm with no acute abnormalities.   Chest x-ray shows mild cardiomegaly but no other acute cardiopulmonary disease.   ASSESSMENT AND PLAN:  1. Left shoulder pain with questionable radiation to the chest along with dizziness and nausea. The patient does have risk factors for coronary artery disease. We will admit to rule out acute coronary syndrome with two sets of cardiac enzymes and a stress test tomorrow morning. The patient is on a beta blocker and will be started on an aspirin. Presently is pain free. She will be on a telemetry floor. No risks for  PE, No Hypoxia, No tachycardia. 2. Uncontrolled hypertension. The patient has elevated blood pressure in the 170s. We will continue her own medications, use IV medications at this time. If sustained to be high, she will need further titration of medications by increasing the doses.  3. Chronic low back pain, stable. Continue home medications.  4. Hyponatremia, asymptomatic. The patient was told by her primary care physician that she is hyponatremic. The patient has had episodes of up to 125 in the past. We will check urine osmolality, sodium and urine creatinine. This needs to be monitored and we will get a BMP in the morning.  5. Deep vein thrombosis prophylaxis with Lovenox.  6. CODE STATUS: FULL CODE.   TIME SPENT: Time spent today on this case was 50 minutes with more than 50% time spent in coordination of care.  ____________________________ Leia Alf. Syris Brookens, MD srs:slb D: 04/22/2012 17:20:07 ET     T: 04/22/2012 17:35:47 ET        JOB#: [id number] cc: Alveta Heimlich R. Darvin Neighbours, MD, <Dictator> Arlis Porta., MD Neita Carp MD ELECTRONICALLY SIGNED 04/22/2012 18:54

## 2014-11-15 NOTE — Discharge Summary (Signed)
PATIENT NAME:  Brittney Simpson, Brittney Simpson MR#:  [id number] DATE OF BIRTH:  [birth year]  DATE OF ADMISSION:  04/22/2012 DATE OF DISCHARGE:  04/24/2012  PRIMARY CARE PHYSICIAN: Dr. Luan Pulling   FINAL DIAGNOSES:  1. Hyponatremia.  2. Atypical chest pain and back discomfort.  3. Hypertension.  4. Hyperlipidemia.  5. Hypothyroidism.  6. Glaucoma.   MEDICATIONS ON DISCHARGE:  1. Levothroid 125 mcg daily.  2. Lipitor 40 mg at bedtime.  3. Famotidine 0.1% ophthalmic solution one drop both eyes twice a day.  4. Timolol 0.5 mg ophthalmic solution one drop both eyes twice a day. 5. Acetaminophen/butalbital/caffeine 325 mg/50 mg/40 mg 1 tablet twice a day as needed for headache.  6. Amlodipine 10 mg daily.  7. Metoprolol 100 mg extended-release twice a day.  8. Calcium and Vitamin D 1 capsule daily.  9. Diazepam 1 tablet twice a day.  10. Omeprazole 20 mg daily.  11. Cephalexin 500 mg every eight hours for five days. 12. Lisinopril 10 mg daily.   DO NOT TAKE:  1. Hydralazine. 2. Hydrochlorothiazide/lisinopril.   REFERRALS:  1. Dr. Juleen China next week. 2. 1 to 2 weeks with Dr. Luan Pulling.   DIET: Regular diet, regular consistency.   ACTIVITY: Activity as tolerated.   REASON FOR ADMISSION: The patient was admitted 04/22/2012, discharged 04/24/2012, admitted as an observation with dizziness, nausea, left shoulder blade pain.   HISTORY OF PRESENT ILLNESS: This is an 81 year old female with history of hypertension, hyperlipidemia, chronic back pain, and hip pain who presented to the Emergency Room with nausea, dizziness, left shoulder blade pain, acute onset, lasted 3-1/2 hours, resolved on its own. The patient was admitted as an observation for possible anginal equivalent, was admitted with uncontrolled hypertension and hyponatremia   LABORATORY AND RADIOLOGICAL DATA DURING THE HOSPITAL COURSE: Urine creatinine random 64.5. Urine osmolality 381. Urine sodium random 49. Troponin negative. Glucose 123, BUN  13, creatinine 0.73, sodium 126, potassium 4.0, chloride 93, CO2 26, calcium 8.6. Liver function tests normal. Lipase negative. White blood cell count 6.1, hemoglobin and hematocrit 14.7 and 42.0, platelet count 230. Urinalysis trace leukocyte esterase.   EKG normal sinus rhythm, no acute ST-T wave changes.   Chest x-ray mild enlargement of the cardiac silhouette.   Next two troponins were negative. Stress test no significant ischemia noted, EF 34%. Low risk scan, otherwise negative. Urine culture at the time of discharge showed unidentified organism. Repeat urine osmolality 216. Urine sodium 41. Urine chloride 40. Repeat sodium was 121 on the 27th. The patient was given hypertonic saline, came up to 128.  HOSPITAL COURSE PER PROBLEM LIST:  1. For the patient's hyponatremia, the patient's hydrochlorothiazide was stopped. Nephrology consultation by Dr. Candiss Norse. [name] sodium stayed low despite fluid restriction and stopping the hydrochlorothiazide. The patient required hypertonic saline. Sodium did come up to 128. The patient was discharged home once the sodium came up, most likely SIADH but also could be a tea and toast diet causing this. Hydrochlorothiazide was stopped. Close clinical follow-up as outpatient needed.  2. For the patient's atypical chest pain and back discomfort, the patient was admitted as an observation. Stress test was negative. Cardiac enzymes were negative. The patient was discharged home.  3. Malignant hypertension. Blood pressure was up and down during the hospital course. Actually needed to cut back on medications, hydralazine and the hydrochlorothiazide/lisinopril combination was stopped. She was kept on her usual amlodipine and metoprolol, and low dose lisinopril was added.  4. For her hyperlipidemia, she is on Lipitor.  5. For her hypothyroidism, she is on Levothroid.  6. For her glaucoma, we continued her eyedrops.  7. For possible urinary tract infection, she was  recently treated as outpatient. Urinalysis only showed trace leukocyte esterase. Urine culture grew out unidentified organism. A course of cephalexin was given. A dose of Rocephin was given in the hospital.  8. The patient also has possibility of gastroesophageal reflux disease. Omeprazole was prescribed.   TIME SPENT ON DISCHARGE: 35 minutes.   ____________________________ Tana Conch. Leslye Peer, MD rjw:drc D: 04/28/2012 21:15:36 ET T: 04/29/2012 11:41:34 ET JOB#: [id number]  cc: Tana Conch. Leslye Peer, MD, <Dictator> Arlis Porta., MD Mamie Levers, MD Marisue Brooklyn MD ELECTRONICALLY SIGNED 05/02/2012 15:13

## 2014-11-15 NOTE — Op Note (Signed)
PATIENT NAME:  Brittney Simpson, Brittney Simpson MR#:  [id number] DATE OF BIRTH:  22-Dec-1927  DATE OF PROCEDURE:  07/08/2012  PREOPERATIVE DIAGNOSIS:  Senile cataract left eye.  POSTOPERATIVE DIAGNOSIS:  Senile cataract left eye.  PROCEDURE:  Phacoemulsification with posterior chamber intraocular lens implantation of the left eye.  LENS:  ZCB00 25- diopter posterior chamber intraocular lens.  ULTRASOUND TIME:  18 % of 1 minute, 16 seconds. CDE 13.5.  SURGEON:  Mali Danielys Madry, MD  ANESTHESIA:  Topical with tetracaine drops and 2% Xylocaine jelly.  COMPLICATIONS:  None.  DESCRIPTION OF PROCEDURE:  The patient was identified in the holding room and transported to the operating room and placed in the supine position under the operating microscope.  The left eye was identified as the operative eye and it was prepped and draped in the usual sterile ophthalmic fashion.  A 1 millimeter clear-corneal paracentesis was made at the 1:30 position.  The anterior chamber was filled with Viscoat viscoelastic.  A 2.4 millimeter keratome was used to make a near-clear corneal incision at the 10:30 position.  A curvilinear capsulorrhexis was made with a cystotome and capsulorrhexis forceps.  Balanced salt solution was used to hydrodissect and hydrodelineate the nucleus.  Phacoemulsification was then used in horizontal chopping fashion to remove the lens nucleus and epinucleus.  The remaining cortex was then removed using the irrigation and aspiration handpiece. Provisc was then placed into the capsular bag to distend it for lens placement.  A ZCB00 25-diopter lens was then injected into the capsular bag.  The remaining viscoelastic was aspirated.  Wounds were hydrated with balanced salt solution.  The anterior chamber was inflated to a physiologic pressure with balanced salt solution.  Miostat was placed into the anterior chamber to constrict the pupil.   No wound leaks were noted.  Topical Vigamox drops and Maxitrol  ointment were applied to the eye. The patient was taken to the recovery room in stable condition without complications of anesthesia or surgery.    ____________________________ Wyonia Hough, MD crb:bjt D: 07/08/2012 14:04:54 ET T: 07/08/2012 14:25:15 ET JOB#: [id number]  cc: Wyonia Hough, MD, <Dictator> Leandrew Koyanagi MD ELECTRONICALLY SIGNED 07/15/2012 12:26

## 2014-11-16 DIAGNOSIS — M488X6 Other specified spondylopathies, lumbar region: Secondary | ICD-10-CM | POA: Diagnosis not present

## 2014-11-16 DIAGNOSIS — M545 Low back pain: Secondary | ICD-10-CM | POA: Diagnosis not present

## 2014-11-16 DIAGNOSIS — M47816 Spondylosis without myelopathy or radiculopathy, lumbar region: Secondary | ICD-10-CM | POA: Diagnosis not present

## 2014-11-16 DIAGNOSIS — M5387 Other specified dorsopathies, lumbosacral region: Secondary | ICD-10-CM | POA: Diagnosis not present

## 2014-11-20 NOTE — Op Note (Signed)
PATIENT NAME:  Brittney Simpson, MCCLEOD MR#:  [id number] DATE OF BIRTH:  Dec 29, 1927  DATE OF PROCEDURE:  07/02/2011  PREOPERATIVE DIAGNOSIS: T7 compression fracture with persistent pain.   POSTOPERATIVE DIAGNOSIS: T7 compression fracture with persistent pain.   PROCEDURES:  1. Kyphoplasty with biopsy of T7 and injection of PMMA cement.  2. Radiologic interpretation of fluoroscopically placed trocars for kyphoplasty and biopsy of T7.   SURGEON: Mellina Benison C. Jalen Oberry, M.D.   ASSISTANT: None.   ANESTHESIA: MAC.   ESTIMATED BLOOD LOSS: Minimal.   COMPLICATIONS: None.   BRIEF CLINICAL NOTE AND PATHOLOGY: The patient had severe back pain after a minor amount of lifting. She was admitted to the hospital for severe pain. MRI showed evidence of a T7 compression fracture with edema. Options, risks, and benefits were discussed in detail. The patient elected to proceed with operative intervention. Following the procedure the vertebral body did expand nicely. It filled nicely with bone cement with no evidence of extravasation.   DESCRIPTION OF PROCEDURE: Preop antibiotics, adequate MAC anesthesia, prone position, all prominences well padded. Routine prepping and draping. Appropriate timeout was called. The T7 vertebral body was identified, pedicles appropriately aligned. The vertebral body was then instrumented through the right side. The trocar was advanced through the pedicles into the vertebral body using radiologic guidance. Biopsy was obtained. Balloon was then inserted. Balloon was inflated. It was removed and cement was injected when it was of the appropriate consistency filling the vertebral body nicely.   The cannula cleaned and removed. The incision was closed with Monocryl. Soft sterile dressing was applied. The patient was awakened and taken to the postanesthesia care unit having tolerated the procedure well.   ____________________________ Alysia Penna. Mauri Pole, MD jcc:cms D: 07/02/2011 20:48:11  ET T: 07/03/2011 09:32:59 ET JOB#: [id number]  cc: Alysia Penna. Mauri Pole, MD, <Dictator> Alysia Penna Shadasia Oldfield MD ELECTRONICALLY SIGNED 07/30/2011 13:28

## 2014-11-20 NOTE — Discharge Summary (Signed)
PATIENT NAME:  Brittney Simpson, Brittney Simpson MR#:  [id number] DATE OF BIRTH:  1927/10/14  DATE OF ADMISSION:  08/05/2011 DATE OF DISCHARGE:  08/07/2011  ADMITTING PHYSICIAN: Cherre Huger, MD   DISCHARGING PHYSICIAN: Gladstone Lighter, MD    PRIMARY MD: Delight Stare, MD   [place]: ID consultation by Dr. Lanae Boast    DISCHARGE DIAGNOSES:  1. Vancomycin-resistant Enterococcus urinary tract infection as outpatient based on cultures on 07/29/2011 from Dr. Lennox Grumbles' office, probably contaminant per Infectious Disease specialist as the colonies were only 50,000 to 100,000 mixed with other mixed organisms. Repeat urinalysis and cultures in the hospital are negative.  2. Recent kyphoplasty for T7 vertebral fracture in December 2012.  3. Escherichia coli urinary tract infection pansensitive in December 2012.  4. Hypertension.  5. Hyperlipidemia.  6. Hypothyroidism.  7. Osteoporosis.  8. Chronic low back pain.  9. Glaucoma.  10. Migraines.   DISCHARGE HOME MEDICATIONS: No medication changes have been done to her home medication. She can basically take all of her home medications without any changes which are as follows:   1. Hydralazine 25 mg p.o. t.i.d. 2. Amlodipine 10 mg p.o. daily.  3. Toprol 100 mg p.o. daily.  4. Fioricet 1 to 2 tablets p.r.n. for headaches.  5. Xalatan 0.05% eyedrops one drop left eye at bedtime.  6. Timolol 0.05% one drop left eye b.i.d.  7. Brimonidine one drop into left eye b.i.d.  8. Vitamin D 4000 international units daily.  9. Trazodone 50 mg at bedtime.  10. Synthroid 125 mcg daily. 11. Diazepam 5 mg p.o. b.i.d.  12. HCTZ/lisinopril 12.5/20 mg 1 tablet p.o. b.i.d.  13. Flexeril 10 mg p.o. t.i.d. as needed.  14. Fosamax 70 mg p.o. once a week.  15. Lipitor 40 mg p.o. daily. 16. Fish Oil 500 mg 4 times a day.    DISCHARGE DIET: Low sodium diet.   DISCHARGE ACTIVITY: As tolerated.   FOLLOW-UP INSTRUCTIONS: Follow-up with PCP in one week.    LABS AT THE TIME OF DISCHARGE: WBC 4.6, hemoglobin 12.1, hematocrit 36.1, platelet count 140, sodium 135, potassium 3.5, chloride 98, bicarb 28, BUN 13, creatinine 0.71, glucose 97, calcium 9.0. Magnesium 2.3. Urinalysis negative for any infection. Urine cultures done in the hospital were no growth in the first 24 hours.   BRIEF HOSPITAL COURSE: Brittney Simpson is an 81 year old pleasant elderly Caucasian female with past medical history significant for degenerate joint disease, glaucoma, hypothyroidism, hypertension, depression and anxiety who was in the hospital in December 2012 for T7 compression fracture and had kyphoplasty done at that time. At the time of discharge, she was found to have a pansensitive Escherichia coli urinary tract infection and was discharged on Macrobid.   She went home. She started to have dysuria and cloudy urine with ketotic smell to the urine. She was being evaluated by her PCP who did cultures on 07/29/2011 and was sent home on Cipro. She was called back to the office saying that her cultures were growing VRE (vancomycin-resistant Enterococcus) which was only sensitive to quinupristin/divaprestin and Zyvox. [name] co-pay was pretty high on the Zyvox and she said she could not afford it so she was sent to the hospital for medications.   Her urinalysis on admission was negative and her urine cultures in the hospital were negative too. Looking at the notes from PCP's office, it seems like her VRE colonies were only 50,000 to 100,000 colonies along with mixed urogenital flora so Dr. Clayborn Bigness felt it probably  was contaminant at the time and she does not need Zyvox anymore. She did receive, however, 48 hours worth of Zyvox while in the hospital. The patient has been otherwise asymptomatic with no other complaints in the hospital.   All her home medications were continued in the hospital without any changes and she is being discharged home.    DISCHARGE DISPOSITION: Home.    DISCHARGE CONDITION: Stable.   TIME SPENT ON DISCHARGE: 40 minutes.   ____________________________ Gladstone Lighter, MD rk:drc D: 08/07/2011 15:46:01 ET T: 08/09/2011 11:38:09 ET JOB#: [id number]  cc: Gladstone Lighter, MD, <Dictator> Marguerita Merles, MD Gladstone Lighter MD ELECTRONICALLY SIGNED 08/12/2011 11:57

## 2014-11-20 NOTE — H&P (Signed)
PATIENT NAME:  Brittney Simpson, Brittney Simpson MR#:  [id number] DATE OF BIRTH:  1928-02-18  DATE OF ADMISSION:  08/05/2011  REFERRING PHYSICIAN: ER physician, Dr. Jasmine December    PRIMARY CARE PHYSICIAN: Delight Stare, MD, [facility]    CHIEF COMPLAINT: Cloudy foul smelling urine.   HISTORY OF PRESENT ILLNESS: The patient is an 81 year old female with past medical history of degenerative joint disease, anxiety, hypertension, hyperlipidemia, glaucoma, and hypothyroidism who was last admitted to [facility] in December 2012 for T7 compression fracture and underwent kyphoplasty. She was discharged home in a stable condition. The patient has history of frequent urinary tract infections and was sent to the Emergency Room for treatment of VRE on urine culture that was sent on 08/01/2011. The VRE is only sensitive to Zyvox and Synercid. The patient reports that she has been having cloudy foul smelling urine for a while. The patient was offered outpatient treatment with oral Zyvox, however, she reports that she cannot afford the 800 dollar co-pay for oral Zyvox treatment as an outpatient. She would prefer to be admitted and get the medications in the hospital. She says she is okay receiving the medication orally since she has very poor veins and it is difficult to get an oral IV in her. Zyvox has 100% oral bioavailability.    ALLERGIES: None known.   PAST MEDICAL HISTORY:  1. Migraines. 2. Degenerative joint disease. 3. Anxiety. 4. Hypertension. 5. Hyperlipidemia. 6. Hypothyroidism. 7. Glaucoma. 8. History of atypical chest pain with negative nuclear stress 01/2010. 9. Chronic back pain and right hip pain. The patient [facility] to the [facility] Clinic.  10. Admission 06/2011 for T7 compression fracture status post kyphoplasty.   PAST SURGICAL HISTORY:  1. Kyphoplasty. 2. Hemorrhoidectomy. 3. Hernia repair. 4. D and C.   CURRENT MEDICATIONS:   1. Xalatan 0.005% one drop into left eye at bedtime.  2. Timolol 0.05% one drop into  left eye b.i.d.  3. Brimonidine one drop into left eye b.i.d.  4. Vitamin D 4000 international units daily.  5. Trazodone 50 mg at bedtime.  6. Synthroid 125 mcg daily.  7. Fioricet 1 to 2 tablets p.r.n. headaches.  8. Diazepam 5 mg b.i.d. p.r.n.  9. Fish Oil 4 times a day. 10. Lipitor 40 mg daily.  11. Toprol-XL 100 mg daily.  12. Norvasc 10 mg daily.  13. Flexeril 5 mg t.i.d. p.r.n.  14. Lisinopril/HCTZ 20/12.5 mg b.i.d.   15. Hydralazine 25 mg t.i.d.  16. Calcium and Vitamin D 600/400 international units b.i.d.  17. Fosamax 70 mg q. weekly.   SOCIAL HISTORY: There is no history of smoking, alcohol, or drug abuse. The patient lives alone. She is retired from the [employer].   FAMILY HISTORY: Father died of lung cancer. Mother died of CVA. Brother had a massive MI.   REVIEW OF SYSTEMS: CONSTITUTIONAL: Patient denies any unusual weight gain, fever, weight loss. Reports fatigue. EYES: Denies any blurred or double vision. Has glaucoma and wears glasses. ENT: Denies any tinnitus or ear pain. Has bilateral hearing loss and is very hard of hearing. RESPIRATORY: Denies any painful respiration, cough, wheezing. CARDIOVASCULAR: Denies any chest pain, palpitations, syncope. GI: Denies any nausea, vomiting, diarrhea. GU: Reports cloudy foul smelling urine. Denies any hematuria. MUSCULOSKELETAL: Has chronic arthritis and pain in multiple joints. Denies any swelling or gout. NEUROLOGICAL: Denies any syncope, fainting spells, weakness. ENDOCRINE: Has hypothyroidism. Denies any increased thirst or nocturia. HEME/LYMPH: Denies any anemia or easy bruisability. PSYCH: Denies any depression. Has history of anxiety.  PHYSICAL EXAMINATION:   VITAL SIGNS: Temperature 98, heart rate 87, respiratory rate 18, blood pressure 155/89, pulse 95% on room air.   GENERAL: The patient is an elderly Caucasian female sitting comfortably in bed not in acute distress.   HEAD: Atraumatic, normocephalic.   EYES: No  pallor, icterus, or cyanosis. Pupils equal, round, and reactive to light and accommodation. Extraocular movements intact.    ENT: Wet mucous membranes. No oropharyngeal erythema or thrush.   NECK: Supple. No masses. No JVD. No thyromegaly or lymphadenopathy.   CHEST WALL: No tenderness to palpation. Not using accessory muscles of respiration. No intercostal muscle retraction.   LUNGS: Bilaterally clear to auscultation. No wheezing, rales, or rhonchi.   CARDIOVASCULAR: S1, S2 regular. No murmur, rubs, or gallops.   ABDOMEN: Soft, nontender, nondistended. No guarding or rigidity. No organomegaly. Normal bowel sounds.   SKIN: No rashes or lesions.   PERIPHERIES: No pedal edema. 2+ dorsalis pedis.   MUSCULOSKELETAL: No cyanosis or clubbing.   NEUROLOGIC: Awake, alert, oriented x3. Nonfocal neurological exam. Cranial nerves grossly intact.    PSYCH: Normal mood and affect.   LABORATORY, DIAGNOSTIC, AND RADIOLOGICAL DATA: Review of records sent from PCP's office showed urine culture sent on 08/01/2011 shows VRE sensitive only to Zyvox and Synercid. Normal CBC. Essentially normal BMP other than glucose of 101 and sodium of 132.  ASSESSMENT AND PLAN: This is an 81 year old female with history of hypertension, hyperlipidemia, and hypothyroidism who was sent from PCP's office for treatment of VRE urinary tract infection.   1. VRE urinary tract infection. The patient reports that she cannot afford co-pay for outpatient oral Zyvox therapy. She is okay receiving oral Zyvox in the hospital since it has 100% oral bioavailability. She reports that she has very poor veins and it is difficult to get an IV in her, therefore, she would like oral treatment. Will start patient on oral Zyvox and place on contact isolation.  2. Hyperglycemia, possibly reactive. The patient has no history of diabetes.  3. Mild hyponatremia. The patient has history of mild hyponatremia possibly due to diuretic use. Will reduce  her HCTZ to 12.5 mg daily rather than her usual dose of 12.5 mg b.i.d. Will increase oral fluid hydration. 4. Hypertension, appears to be well controlled at present. Will continue the patient's current medications.  5. Hypothyroidism. Will continue patient's current Synthroid.  6. Hyperlipidemia. Will continue Lipitor and Fish Oil.  7. History of anxiety. Will continue p.r.n. Valium. 8. Will place on GI and DVT prophylaxis.   Reviewed old medical records, discussed with the ER physician, discussed with the patient and her family the plan of care and management.   TIME SPENT: 75 minutes.  ____________________________ Cherre Huger, MD sp:drc D: 08/05/2011 18:32:21 ET T: 08/06/2011 06:10:03 ET JOB#: [id number]  cc: Cherre Huger, MD, <Dictator> Marguerita Merles, MD Cherre Huger MD ELECTRONICALLY SIGNED 08/06/2011 19:16

## 2014-11-20 NOTE — Consult Note (Signed)
PATIENT NAME:  Brittney Simpson, Brittney Simpson MR#:  [id number] DATE OF BIRTH:  [birth year]  DATE OF CONSULTATION:  08/07/2011  REFERRING PHYSICIAN:  Dr. Tressia Miners  CONSULTING PHYSICIAN:  Heinz Knuckles. Julianna Vanwagner, MD  REASON FOR CONSULTATION: Possible VRE urinary tract infection.   HISTORY OF PRESENT ILLNESS: Patient is an 81 year old white female with a past history significant for hypothyroidism who was admitted on 01/07 with an outpatient urine culture that was positive for VRE. Patient states that she had had a few day history of some cloudy and foul smelling urine. She went to see her primary care doctor who performed an evaluation of her urine. The office notes indicates that a urine dip was performed which showed trace amounts of leukocytes but no other abnormalities. A urine culture was obtained on 12/31 which grew 50 to 100,000 CFU/mL of VRE and  10 to 25,000 CFU/mL of mixed urogenital flora. The patient denies any fevers, chills, sweats. She has had no dysuria. No increased frequency. No increased urgency. She has had no flank pain. No abdominal pain. No nausea and no vomiting. She was prescribed linezolid as an outpatient but this was going to be too expensive for her so she was admitted and has been on oral linezolid since admission. Her symptoms have not significantly altered from prior to her admission.   ALLERGIES: None.   PAST MEDICAL HISTORY:  1. Migraines. 2. Osteoarthritis. 3. Anxiety. 4. Hypertension. 5. Hypercholesterolemia. 6. Hypothyroidism. 7. T7 compression fracture status post kyphoplasty.   SOCIAL HISTORY: The patient lives by herself. She does not smoke nor does she drink.   FAMILY HISTORY: Positive for lung cancer, stroke and coronary artery disease.    REVIEW OF SYSTEMS: GENERAL: No fevers, chills, sweats or malaise. GASTROINTESTINAL: No nausea, vomiting. No change in her bowels. No abdominal pain. GENITOURINARY: No dysuria. No increased frequency. No hesitancy. No flank pain. She  has had some foul-smelling cloudy urine. No incontinence.   PHYSICAL EXAMINATION:  VITAL SIGNS: T-max 99.0, pulse 64, blood pressure 130/60, 94% on room air.   GENERAL: 81 year old white female in no acute distress.   HEENT: Normocephalic, atraumatic.   CHEST: Clear to auscultation bilaterally. Good air movement. No focal consolidation.   CARDIAC: Regular rate and rhythm without murmur, rub, or gallop.   ABDOMEN: Soft, nontender, nondistended. No hepatosplenomegaly. No hernias noted. No CVA tenderness.   SKIN: No rashes.   NEUROLOGIC: Patient was awake and interactive, moving all four extremities.   PSYCHIATRIC: Mood and affect appeared normal.   LABORATORY, DIAGNOSTIC, AND RADIOLOGICAL DATA: BUN 13, creatinine 0.71, white count 4.6, hemoglobin 12.1, platelet count 140, ANC 2.5. Urinalysis was negative. A urine culture had no growth.   IMPRESSION: 81 year old white female with a past history significant for hypothyroidism admitted with an outpatient urine culture positive for VRE.   RECOMMENDATIONS:  1. Her only symptoms were cloudy foul-smelling urine. She had no dysuria, nausea, vomiting, flank pain, fever or increased frequency or hesitancy. Her outpatient urine culture had less than 100,000 CFU./mL of VRE and also had mixed bacterial flora present. The outpatient urine dip showed only trace leukocytes and was otherwise negative. These do not meet criteria for a urinary tract infection.  2. Urinalysis and repeat urine culture in the hospital are also negative.  3. There is nothing to suggest a urinary tract infection. No antibiotics are needed.   I will sign off. Please feel free to contact me with any further questions.   ____________________________ Heinz Knuckles Gaytha Raybourn, MD meb:cms D: 08/07/2011  12:51:00 ET T: 08/07/2011 13:29:24 ET JOB#: [id number]  cc: Heinz Knuckles. Maanvi Lecompte, MD, <Dictator> Rickesha Veracruz E Kendarius Vigen MD ELECTRONICALLY SIGNED 08/08/2011 13:11

## 2014-11-20 NOTE — Consult Note (Signed)
PATIENT NAME:  Brittney Simpson, CERRITO MR#:  [id number] DATE OF BIRTH:  1928/05/06  DATE OF CONSULTATION:  07/01/2011  REFERRING PHYSICIAN:  Dr. Karsten Fells  CONSULTING PHYSICIAN:  Alysia Penna. Geeta Dworkin, MD  REASON FOR CONSULTATION: 81 year old female with severe back pain.   HISTORY OF PRESENT ILLNESS: Patient was doing some lifting, doing Christmas decorations, felt acute mid upper back pain. She had such severe pain that she presented to the Emergency Room. She has a history of low back pain and going to the pain clinic, seeing Dr. Dossie Arbour.    On evaluation in the hospital she was found to have a T7 compression fracture. She had previously been worked up for atypical chest pain with negative work-up.   Patient's pain is worse with any movement or activity. She has difficulty getting up and down and ambulating because of the pain. Narcotic medications have provided some relief but do cause significant side effects. She denies any numbness or tingling.   PAST SURGICAL HISTORY:  1. Hemorrhoidectomy. 2. Hernia repair.  3. Dilatation and curettage.    PAST MEDICAL HISTORY:  1. Degenerative joint disease. 2. Chronic low back pain.  3. Anxiety.  4. Hypertension.  5. Glaucoma.  6. Hypothyroidism.  7. Hyperlipidemia.  8. Atypical chest pain with negative nuclear stress test.   ALLERGIES: No known drug allergies.   SOCIAL HISTORY: Patient does not smoke or drink. She lives alone, retired.   FAMILY HISTORY: Notable for lung cancer, cerebrovascular accident, cardiac disease.   MEDICATIONS: As per SCM; this is reviewed.   REVIEW OF SYSTEMS: Unremarkable for any acute cardiorespiratory, GI, or GU symptoms. No fever, chills or constitutional symptoms.   PHYSICAL EXAMINATION:  GENERAL: Elderly female, fairly comfortable as long as she is still but when she attempts to get up she has significant mid-upper back pain.   VITALS: Blood pressure 160/76, pulse 62 and regular, respirations 18.   HEENT: Pupils  equal, round and reactive to light and accommodation. Extraocular movements intact.   NECK: Supple without bruits.   CHEST: Clear.   CARDIAC: Normal.   ABDOMEN: Benign with normal bowel sounds and no tenderness.   ORTHOPEDIC EXAMINATION: Cervical spine has good range of motion with minimal discomfort.   Thoracic spine reveals kyphosis with tenderness in the mid thoracic area. There is mild muscular guarding. Lumbosacral spine reveals mild tenderness with decreased range of motion.   Shoulders, elbows, wrists and hands overall have good range of motion with normal neurovascular examination.   Hips, knees, feet and ankles have good range of motion with normal neurovascular examination.   LABORATORY, DIAGNOSTIC AND RADIOLOGICAL DATA: Laboratory work is reviewed. It is notable for sodium 127. TSH 5.39. Hemogram is notable for hemoglobin 11.7. Urinalysis is 3+ leukocyte esterase positive. Culture is pending. EKG shows no acute change.   MRI of the thoracic spine is reviewed in detail. It shows a T7 compression fracture with edema, approximately 50% height loss. Minimal degenerative change.   Thoracic spine x-rays reveal T7 compression fracture. Chest x-ray reveals no acute change.   CLINICAL IMPRESSION:  1. T7 compression fracture with significant disabling pain. Narcotics do help but have significant side effects. This is a very difficult fracture to brace. Options are discussed with the patient. This had previously been discussed by Dr. Marry Guan. She looked through the information. She wants to proceed with kyphoplasty. Options, risks and benefits are discussed in detail. We will set this up for tomorrow.  2. Medical history of atypical chest pain, work-up unremarkable.  3. Hypertension, stable.  4. Hypothyroidism, stable.   PLAN: Proceed with T7 kyphoplasty and biopsy.   ____________________________ Alysia Penna Mauri Pole, MD jcc:cms D: 07/02/2011 06:26:00 ET T: 07/02/2011 09:35:48  ET JOB#: [id number]  cc: Alysia Penna. Mauri Pole, MD, <Dictator> Alysia Penna Brenn Gatton MD ELECTRONICALLY SIGNED 08/15/2011 16:00

## 2014-11-20 NOTE — Consult Note (Signed)
Impression: 81yo WF w/ h/o hypothyroidism admitted with an outpatient urine culture positive for VRE.  Her only symptoms were cloudy foul smelling urine.  She had no dysuria, N/V, flank pain, fever, increased frequency or hesitancy.  Her outpt urine culture had <100,000 CFU/ml of VRE and also had mixed bacterial flora.  Outpatient No urine dip showed only trace leukocytes and was otherwise negative. urinalysis is available from the outpatient evaluation.   Urinalysis and repeat UCx in the hospital are negative. There is nothing to suggest a UTI.  No antibiotics are needed.   Electronic Signatures: Spero Gunnels, Heinz Knuckles (MD) (Signed on 09-Jan-13 12:43)  Authored   Last Updated: 09-Jan-13 12:46 by Lonnell Chaput, Heinz Knuckles (MD)

## 2014-11-24 ENCOUNTER — Emergency Department: Admit: 2014-11-24 | Disposition: A | Discharge status: Home / Self Care | Payer: Self-pay | Admitting: Internal Medicine

## 2014-11-24 DIAGNOSIS — Z79899 Other long term (current) drug therapy: Secondary | ICD-10-CM | POA: Diagnosis not present

## 2014-11-24 DIAGNOSIS — R0602 Shortness of breath: Secondary | ICD-10-CM | POA: Diagnosis not present

## 2014-11-24 DIAGNOSIS — R110 Nausea: Secondary | ICD-10-CM | POA: Diagnosis not present

## 2014-11-24 DIAGNOSIS — J029 Acute pharyngitis, unspecified: Secondary | ICD-10-CM | POA: Diagnosis not present

## 2014-11-24 DIAGNOSIS — R079 Chest pain, unspecified: Secondary | ICD-10-CM | POA: Diagnosis not present

## 2014-11-24 DIAGNOSIS — Z792 Long term (current) use of antibiotics: Secondary | ICD-10-CM | POA: Diagnosis not present

## 2014-11-24 DIAGNOSIS — R509 Fever, unspecified: Secondary | ICD-10-CM | POA: Diagnosis not present

## 2014-11-24 DIAGNOSIS — R0789 Other chest pain: Secondary | ICD-10-CM | POA: Diagnosis not present

## 2014-11-24 DIAGNOSIS — I10 Essential (primary) hypertension: Secondary | ICD-10-CM | POA: Diagnosis not present

## 2014-11-24 DIAGNOSIS — F419 Anxiety disorder, unspecified: Secondary | ICD-10-CM | POA: Diagnosis not present

## 2014-11-24 LAB — BASIC METABOLIC PANEL
Anion Gap: 8 (ref 7–16)
BUN: 13 mg/dL
Calcium, Total: 8.9 mg/dL
Chloride: 97 mmol/L — ABNORMAL LOW
Co2: 26 mmol/L
Creatinine: 0.81 mg/dL
EGFR (African American): >60
EGFR (Non-African Amer.): >60
Glucose: 108 mg/dL — ABNORMAL HIGH
Potassium: 4.2 mmol/L
Sodium: 131 mmol/L — ABNORMAL LOW

## 2014-11-24 LAB — CBC
HCT: 38.9 % (ref 35.0–47.0)
HGB: 12.3 g/dL (ref 12.0–16.0)
MCH: 25 pg — ABNORMAL LOW (ref 26.0–34.0)
MCHC: 31.7 g/dL — ABNORMAL LOW (ref 32.0–36.0)
MCV: 79 fL — ABNORMAL LOW (ref 80–100)
Platelet: 109 10*3/uL — ABNORMAL LOW (ref 150–440)
RBC: 4.94 10*6/uL (ref 3.80–5.20)
RDW: 14.9 % — ABNORMAL HIGH (ref 11.5–14.5)
WBC: 4.4 10*3/uL (ref 3.6–11.0)

## 2014-11-24 LAB — TROPONIN I
Troponin-I: <0.03 ng/mL
Troponin-I: <0.03 ng/mL

## 2014-11-29 DIAGNOSIS — M545 Low back pain: Secondary | ICD-10-CM | POA: Diagnosis not present

## 2014-11-29 DIAGNOSIS — M47816 Spondylosis without myelopathy or radiculopathy, lumbar region: Secondary | ICD-10-CM | POA: Diagnosis not present

## 2014-11-29 DIAGNOSIS — M4696 Unspecified inflammatory spondylopathy, lumbar region: Secondary | ICD-10-CM | POA: Diagnosis not present

## 2014-11-29 DIAGNOSIS — Z79899 Other long term (current) drug therapy: Secondary | ICD-10-CM | POA: Diagnosis not present

## 2014-11-29 DIAGNOSIS — Z79891 Long term (current) use of opiate analgesic: Secondary | ICD-10-CM | POA: Diagnosis not present

## 2014-11-29 DIAGNOSIS — G8929 Other chronic pain: Secondary | ICD-10-CM | POA: Diagnosis not present

## 2014-12-07 DIAGNOSIS — N289 Disorder of kidney and ureter, unspecified: Secondary | ICD-10-CM | POA: Diagnosis not present

## 2014-12-07 DIAGNOSIS — F419 Anxiety disorder, unspecified: Secondary | ICD-10-CM | POA: Diagnosis not present

## 2014-12-07 DIAGNOSIS — I10 Essential (primary) hypertension: Secondary | ICD-10-CM | POA: Diagnosis not present

## 2014-12-07 DIAGNOSIS — E039 Hypothyroidism, unspecified: Secondary | ICD-10-CM | POA: Diagnosis not present

## 2014-12-07 DIAGNOSIS — R51 Headache: Secondary | ICD-10-CM | POA: Diagnosis not present

## 2014-12-07 DIAGNOSIS — E780 Pure hypercholesterolemia: Secondary | ICD-10-CM | POA: Diagnosis not present

## 2014-12-07 DIAGNOSIS — K219 Gastro-esophageal reflux disease without esophagitis: Secondary | ICD-10-CM | POA: Diagnosis not present

## 2014-12-07 DIAGNOSIS — M549 Dorsalgia, unspecified: Secondary | ICD-10-CM | POA: Diagnosis not present

## 2014-12-09 DIAGNOSIS — H4011X3 Primary open-angle glaucoma, severe stage: Secondary | ICD-10-CM | POA: Diagnosis not present

## 2014-12-30 ENCOUNTER — Other Ambulatory Visit: Payer: Self-pay | Admitting: Family Medicine

## 2014-12-30 DIAGNOSIS — E871 Hypo-osmolality and hyponatremia: Secondary | ICD-10-CM | POA: Diagnosis not present

## 2014-12-30 DIAGNOSIS — N183 Chronic kidney disease, stage 3 (moderate): Secondary | ICD-10-CM | POA: Diagnosis not present

## 2014-12-30 DIAGNOSIS — N390 Urinary tract infection, site not specified: Secondary | ICD-10-CM | POA: Diagnosis not present

## 2014-12-30 DIAGNOSIS — N2581 Secondary hyperparathyroidism of renal origin: Secondary | ICD-10-CM | POA: Diagnosis not present

## 2014-12-30 DIAGNOSIS — I10 Essential (primary) hypertension: Secondary | ICD-10-CM | POA: Diagnosis not present

## 2014-12-30 MED ORDER — TRAZODONE HCL 50 MG PO TABS: 25.0000 mg | ORAL_TABLET | Freq: Every evening | ORAL | Status: DC | PRN

## 2015-01-13 DIAGNOSIS — E871 Hypo-osmolality and hyponatremia: Secondary | ICD-10-CM | POA: Diagnosis not present

## 2015-01-13 DIAGNOSIS — N390 Urinary tract infection, site not specified: Secondary | ICD-10-CM | POA: Diagnosis not present

## 2015-01-13 DIAGNOSIS — R600 Localized edema: Secondary | ICD-10-CM | POA: Diagnosis not present

## 2015-01-13 DIAGNOSIS — N183 Chronic kidney disease, stage 3 (moderate): Secondary | ICD-10-CM | POA: Diagnosis not present

## 2015-01-27 ENCOUNTER — Other Ambulatory Visit: Payer: Self-pay | Admitting: Family Medicine

## 2015-01-27 MED ORDER — CLONAZEPAM 0.5 MG PO TABS: ORAL_TABLET | ORAL | Status: DC

## 2015-02-22 DIAGNOSIS — M545 Low back pain: Secondary | ICD-10-CM | POA: Diagnosis not present

## 2015-02-22 DIAGNOSIS — M488X6 Other specified spondylopathies, lumbar region: Secondary | ICD-10-CM | POA: Diagnosis not present

## 2015-02-22 DIAGNOSIS — M47816 Spondylosis without myelopathy or radiculopathy, lumbar region: Secondary | ICD-10-CM | POA: Diagnosis not present

## 2015-02-22 DIAGNOSIS — M5387 Other specified dorsopathies, lumbosacral region: Secondary | ICD-10-CM | POA: Diagnosis not present

## 2015-02-24 ENCOUNTER — Telehealth: Payer: Self-pay

## 2015-02-24 NOTE — Telephone Encounter (Signed)
Arbie Cookey called needing refill on Clonazepam and Amy would rather Dr. Luan Pulling review. They will run out on 02/26/2015 and was last seen 11/2014 with f/u scheduled for 03/24/2015. Last filled 01/27/2015 30 days. Please let me know if this can be filed and if she needs to pick it up here.[facility]

## 2015-02-24 NOTE — Telephone Encounter (Signed)
Has enough to last weekend. I will discuss this with Dr. Luan Pulling Monday.[facility]

## 2015-02-24 NOTE — Telephone Encounter (Signed)
It looks like it was prescribed on 01/27/2015 for 30 pills and filled on 7/1 per the database. Is she out completely? If she is taking PRN 1/2 tablet she should have enough to get her through the weekend. I would feel more comfortable if Dr. Luan Pulling prescribed this medication because he knows how long he wants to her to stay on it and how often to take. If she is totally out, I will give her enough pills to get through the weekend.

## 2015-02-24 NOTE — Telephone Encounter (Signed)
Sounds good. I will route this to his box.

## 2015-02-24 NOTE — Telephone Encounter (Signed)
Brittney Simpson

## 2015-02-24 NOTE — Telephone Encounter (Signed)
Patients daughter Arbie Cookey called to see if we could do refill for Clonazepan. She has picked up in past and has appt next month. She said one time I called it in and she was hoping we could do that but I explained it is controlled. Please review and let me know what to do. She has taken this for over a year and there are no notes that he is trying to change that at all. Last OV 11/2014 3 month f/u ordered. Last RX sent 12/16 with 6 refills. Taking this for Anxiety.[facility]. I need to call daughter back when you advise me please.

## 2015-02-27 ENCOUNTER — Other Ambulatory Visit: Payer: Self-pay | Admitting: Family Medicine

## 2015-02-27 MED ORDER — CLONAZEPAM 0.5 MG PO TABS: ORAL_TABLET | ORAL | Status: DC

## 2015-02-27 NOTE — Telephone Encounter (Signed)
I will refill for her to pick up.-jh

## 2015-02-27 NOTE — Telephone Encounter (Signed)
I will refill for them to pick up.-jh

## 2015-03-24 ENCOUNTER — Ambulatory Visit (INDEPENDENT_AMBULATORY_CARE_PROVIDER_SITE_OTHER): Payer: Medicare Other | Admitting: Family Medicine

## 2015-03-24 ENCOUNTER — Encounter: Payer: Self-pay | Admitting: Family Medicine

## 2015-03-24 VITALS — BP 135/60 | HR 52 | Temp 98.2°F | Resp 16 | Ht 66.0 in | Wt 133.6 lb

## 2015-03-24 DIAGNOSIS — I10 Essential (primary) hypertension: Secondary | ICD-10-CM | POA: Diagnosis not present

## 2015-03-24 DIAGNOSIS — N183 Chronic kidney disease, stage 3 unspecified: Secondary | ICD-10-CM

## 2015-03-24 DIAGNOSIS — F419 Anxiety disorder, unspecified: Secondary | ICD-10-CM | POA: Diagnosis not present

## 2015-03-24 MED ORDER — CLONAZEPAM 0.5 MG PO TABS: ORAL_TABLET | ORAL | Status: DC

## 2015-03-24 NOTE — Progress Notes (Signed)
Name: Brittney Simpson   MRN: [id number]    DOB: 1927/12/06   Date:03/24/2015       Progress Note  Subjective  Chief Complaint  Chief Complaint  Patient presents with  . Hypertension    HPI   Here to f/u HBP and anxiety.  Sees Nephrologist re: CKD.  Doing pretty well.  Feet had been swelling, but on Lasix now.  Still lives alone. No problem-specific assessment & plan notes found for this encounter.   Past Medical History  Diagnosis Date  . Back ache   . Hyperlipidemia   . Hypertension   . Thyroid disease     Social History  Substance Use Topics  . Smoking status: Never Smoker   . Smokeless tobacco: Never Used  . Alcohol Use: No     Current outpatient prescriptions:  .  amLODipine (NORVASC) 5 MG tablet, , Disp: , Rfl:  .  clonazePAM (KLONOPIN) 0.5 MG tablet, , Disp: , Rfl:  .  Ergocalciferol (VITAMIN D2) 2000 UNITS TABS, Take 1 capsule by mouth., Disp: , Rfl:  .  furosemide (LASIX) 20 MG tablet, Take 20 mg by mouth daily., Disp: , Rfl:  .  latanoprost (XALATAN) 0.005 % ophthalmic solution, , Disp: , Rfl:  .  levothyroxine (SYNTHROID, LEVOTHROID) 125 MCG tablet, , Disp: , Rfl:  .  lisinopril (PRINIVIL,ZESTRIL) 30 MG tablet, , Disp: , Rfl:  .  metoprolol succinate (TOPROL-XL) 100 MG 24 hr tablet, , Disp: , Rfl:  .  omeprazole (PRILOSEC) 20 MG capsule, , Disp: , Rfl:  .  sulfamethoxazole-trimethoprim (BACTRIM,SEPTRA) 400-80 MG per tablet, , Disp: , Rfl:  .  traMADol (ULTRAM) 50 MG tablet, , Disp: , Rfl:  .  traZODone (DESYREL) 50 MG tablet, Take 0.5-1 tablets (25-50 mg total) by mouth at bedtime as needed for sleep., Disp: 30 tablet, Rfl: 3  Allergies  Allergen Reactions  . Sulfa Antibiotics Anaphylaxis    Review of Systems  Constitutional: Negative for fever, chills, weight loss and malaise/fatigue.  HENT: Negative for hearing loss.   Eyes: Negative for blurred vision and double vision.  Respiratory: Negative for cough, sputum production, shortness of breath and  wheezing.   Cardiovascular: Positive for leg swelling. Negative for chest pain, palpitations and orthopnea.  Gastrointestinal: Negative for heartburn, nausea, vomiting, abdominal pain, diarrhea and blood in stool.  Genitourinary: Negative for dysuria, urgency and frequency.  Skin: Negative for rash.  Neurological: Negative for dizziness, sensory change, focal weakness, weakness and headaches.  Psychiatric/Behavioral: The patient is not nervous/anxious.        Anxiety and insomnia controlled on Clonazepam an Trazadone.      Objective  Filed Vitals:   03/24/15 1302  BP: 154/54  Pulse: 52  Temp: 98.2 F (36.8 C)  TempSrc: Oral  Resp: 16  Height: 5\' 6"  (1.676 m)  Weight: 133 lb 9.6 oz (60.601 kg)     Physical Exam  Constitutional: She is oriented to person, place, and time. No distress.  HENT:  Head: Normocephalic and atraumatic.  Eyes: Conjunctivae and EOM are normal. Pupils are equal, round, and reactive to light. No scleral icterus.  Neck: Normal range of motion. Neck supple. Carotid bruit is not present. No thyromegaly present.  Cardiovascular: Normal rate, regular rhythm, normal heart sounds and intact distal pulses.  Exam reveals no gallop and no friction rub.   No murmur heard. Pulmonary/Chest: Effort normal and breath sounds normal. No respiratory distress. She has no wheezes. She has no rales.  Abdominal: Soft.  Bowel sounds are normal. She exhibits no distension and no mass. There is no tenderness.  Musculoskeletal: She exhibits edema (trace non-pitting edema of bilateral feet.).  Lymphadenopathy:    She has no cervical adenopathy.  Neurological: She is alert and oriented to person, place, and time. No cranial nerve deficit.  Skin: Skin is warm and dry.  Psychiatric: Mood, memory, affect and judgment normal.  Vitals reviewed.     No results found for this or any previous visit (from the past 2160 hour(s)).   Assessment & Plan  1. Essential  hypertension   2. Chronic anxiety  - clonazePAM (KLONOPIN) 0.5 MG tablet; Take 1/2 tablet orally, twice daily for anxiedty  Dispense: 30 tablet; Refill: 5  3. CKD (chronic kidney disease) stage 3, GFR 30-59 ml/min

## 2015-03-24 NOTE — Patient Instructions (Signed)
Continue current meds.  Keep Nephrology f/u.

## 2015-03-31 DIAGNOSIS — N183 Chronic kidney disease, stage 3 (moderate): Secondary | ICD-10-CM | POA: Diagnosis not present

## 2015-03-31 DIAGNOSIS — I10 Essential (primary) hypertension: Secondary | ICD-10-CM | POA: Diagnosis not present

## 2015-03-31 DIAGNOSIS — E871 Hypo-osmolality and hyponatremia: Secondary | ICD-10-CM | POA: Diagnosis not present

## 2015-03-31 DIAGNOSIS — N2581 Secondary hyperparathyroidism of renal origin: Secondary | ICD-10-CM | POA: Diagnosis not present

## 2015-04-26 ENCOUNTER — Other Ambulatory Visit: Payer: Self-pay | Admitting: Family Medicine

## 2015-04-26 MED ORDER — METOPROLOL SUCCINATE ER 100 MG PO TB24: 100.0000 mg | ORAL_TABLET | Freq: Every day | ORAL | Status: DC

## 2015-04-26 MED ORDER — LISINOPRIL 30 MG PO TABS: 30.0000 mg | ORAL_TABLET | Freq: Every day | ORAL | Status: DC

## 2015-04-26 MED ORDER — LEVOTHYROXINE SODIUM 125 MCG PO TABS: 125.0000 ug | ORAL_TABLET | Freq: Every day | ORAL | Status: DC

## 2015-04-26 MED ORDER — OMEPRAZOLE 20 MG PO CPDR: 20.0000 mg | DELAYED_RELEASE_CAPSULE | Freq: Every day | ORAL | Status: DC

## 2015-04-26 NOTE — Telephone Encounter (Signed)
Received refill request from patient pharmacy.

## 2015-05-02 ENCOUNTER — Other Ambulatory Visit: Payer: Self-pay | Admitting: *Deleted

## 2015-05-02 ENCOUNTER — Telehealth: Payer: Self-pay | Admitting: Family Medicine

## 2015-05-02 MED ORDER — METOPROLOL SUCCINATE ER 100 MG PO TB24: 100.0000 mg | ORAL_TABLET | Freq: Two times a day (BID) | ORAL | Status: DC

## 2015-05-02 NOTE — Telephone Encounter (Signed)
Received rx correction from [facility]. Per patient's daughter Metoprolol 100 mg is twice daily. Rx sent to pharmacy was one daily. I have made the adjustment in Epic it was enter incorrectly from [place].

## 2015-05-02 NOTE — Telephone Encounter (Signed)
Ok-jh 

## 2015-05-24 ENCOUNTER — Ambulatory Visit: Payer: Medicare Other | Attending: Pain Medicine | Admitting: Pain Medicine

## 2015-05-24 ENCOUNTER — Encounter: Payer: Self-pay | Admitting: Pain Medicine

## 2015-05-24 ENCOUNTER — Other Ambulatory Visit: Payer: Self-pay | Admitting: Pain Medicine

## 2015-05-24 VITALS — BP 155/67 | HR 54 | Temp 98.0°F | Resp 18 | Ht 66.0 in | Wt 131.0 lb

## 2015-05-24 DIAGNOSIS — M47816 Spondylosis without myelopathy or radiculopathy, lumbar region: Secondary | ICD-10-CM | POA: Insufficient documentation

## 2015-05-24 DIAGNOSIS — F1190 Opioid use, unspecified, uncomplicated: Secondary | ICD-10-CM | POA: Diagnosis not present

## 2015-05-24 DIAGNOSIS — M545 Low back pain: Secondary | ICD-10-CM | POA: Diagnosis not present

## 2015-05-24 DIAGNOSIS — Z79891 Long term (current) use of opiate analgesic: Secondary | ICD-10-CM | POA: Diagnosis not present

## 2015-05-24 DIAGNOSIS — Z79899 Other long term (current) drug therapy: Secondary | ICD-10-CM

## 2015-05-24 DIAGNOSIS — M549 Dorsalgia, unspecified: Secondary | ICD-10-CM | POA: Diagnosis present

## 2015-05-24 DIAGNOSIS — G8929 Other chronic pain: Secondary | ICD-10-CM | POA: Diagnosis not present

## 2015-05-24 DIAGNOSIS — Z5181 Encounter for therapeutic drug level monitoring: Secondary | ICD-10-CM

## 2015-05-24 DIAGNOSIS — M47896 Other spondylosis, lumbar region: Secondary | ICD-10-CM

## 2015-05-24 MED ORDER — TRAMADOL HCL 50 MG PO TABS: 50.0000 mg | ORAL_TABLET | Freq: Three times a day (TID) | ORAL | Status: DC | PRN

## 2015-05-24 NOTE — Progress Notes (Signed)
Safety precautions to be maintained throughout the outpatient stay will include: orient to surroundings, keep bed in low position, maintain call bell within reach at all times, provide assistance with transfer out of bed and ambulation. Pill count Tramadol # 39

## 2015-05-26 ENCOUNTER — Other Ambulatory Visit: Payer: Self-pay | Admitting: Family Medicine

## 2015-05-27 NOTE — Progress Notes (Signed)
Patient's Name: Brittney Simpson MRN: [id number] DOB: 1928/02/28 DOS: 05/24/2015  Primary Reason(s) for Visit: Encounter for Medication Management. CC: Back Pain   HPI:   Brittney Simpson is a 81 y.o. year old, female patient, who returns today as an established patient. She has Chronic pain; Long term current use of opiate analgesic; Long term prescription opiate use; Opiate use; Encounter for therapeutic drug level monitoring; Facet syndrome, lumbar; Lumbar spondylosis; and Chronic low back pain on her problem list.. Her primarily concern today is the Back Pain  Today's Pain Score: 4  Pain Type: Chronic pain Pain Location: Back Pain Orientation: Lower Pain Descriptors / Indicators: Dull Pain Frequency: Constant     Date of Last Visit: Date of Last Visit: 03/28/15 Service Provided on Last Visit: Service Provided on Last Visit: Med Refill  Pharmacotherapy Review: Side-effects or Adverse reactions: None reported. Effectiveness: Described as relatively effective, allowing for increase in activities of daily living (ADL). Onset of action: Within expected pharmacological parameters. Duration of action: Within normal limits for medication. Peak effect: Timing and results are as within normal expected parameters. [place] PMP: Compliant with practice rules and regulations. DST: Compliant with practice rules and regulations. Lab work: No new labs ordered by our practice. Treatment compliance: Compliant. Substance Use Disorder (SUD) Risk Level: Low Planned course of action: Continue therapy as is.  Allergies: Brittney Simpson is allergic to sulfa antibiotics.  Meds: The patient has a current medication list which includes the following prescription(s): amlodipine, clonazepam, vitamin d2, furosemide, latanoprost, levothyroxine, lisinopril, metoprolol succinate, omeprazole, sulfamethoxazole-trimethoprim, tramadol, and trazodone. Requested Prescriptions   Signed Prescriptions Disp Refills  .  traMADol (ULTRAM) 50 MG tablet 90 tablet 5    Sig: Take 1 tablet (50 mg total) by mouth 3 (three) times daily as needed for moderate pain.    ROS: Constitutional: Afebrile, no chills, well hydrated and well nourished Gastrointestinal: negative Musculoskeletal:negative Neurological: negative Behavioral/Psych: negative  PFSH: Medical:  Brittney Simpson  has a past medical history of Back ache; Hyperlipidemia; Hypertension; Thyroid disease; Anxiety; Osteopenia; Hiatal hernia; and Hypothyroid. Family: family history includes Cancer in her father; Stroke in her mother. Surgical:  has past surgical history that includes Hernia repair; Hernia repair; and Hemorrhoid surgery. Tobacco:  reports that she has never smoked. She has never used smokeless tobacco. Alcohol:  reports that she does not drink alcohol. Drug:  reports that she does not use illicit drugs.  Physical Exam: Vitals:  Today's Vitals   05/24/15 1512 05/24/15 1514  BP: 155/67   Pulse: 54   Temp: 98 F (36.7 C)   Resp: 18   Height: 5\' 6"  (1.676 m)   Weight: 131 lb (59.421 kg)   SpO2: 98%   PainSc: 4  4   PainLoc: Back   Calculated BMI: Body mass index is 21.15 kg/(m^2). General appearance: alert, cooperative, appears stated age and no distress Eyes: conjunctivae/corneas clear. PERRL, EOM's intact. Fundi benign. Lungs: No evidence respiratory distress, no audible rales or ronchi and no use of accessory muscles of respiration Neck: no adenopathy, no carotid bruit, no JVD, supple, symmetrical, trachea midline and thyroid not enlarged, symmetric, no tenderness/mass/nodules Back: symmetric, no curvature. ROM normal. No CVA tenderness. Extremities: extremities normal, atraumatic, no cyanosis or edema Pulses: 2+ and symmetric Skin: Skin color, texture, turgor normal. No rashes or lesions Neurologic: Grossly normal    Assessment: Encounter Diagnosis:  Primary Diagnosis: Chronic pain [G89.29]  Plan: Brittney Simpson was seen today for  back pain.  Diagnoses and all orders for  this visit:  Chronic pain -     traMADol (ULTRAM) 50 MG tablet; Take 1 tablet (50 mg total) by mouth 3 (three) times daily as needed for moderate pain.  Long term current use of opiate analgesic  Long term prescription opiate use -     Drugs of abuse screen w/o alc, rtn urine-sln; Future  Opiate use  Encounter for therapeutic drug level monitoring  Facet syndrome, lumbar -     LUMBAR FACET(MEDIAL BRANCH NERVE BLOCK) MBNB; Standing  Other osteoarthritis of spine, lumbar region  Chronic low back pain     There are no Patient Instructions on file for this visit. Medications discontinued today:  Medications Discontinued During This Encounter  Medication Reason  . traMADol (ULTRAM) 50 MG tablet Reorder   Medications administered today:  Brittney Simpson had no medications administered during this visit.  Primary Care Physician: Dicky Doe, MD Location: [facility] Out[facility] Facility Note by: Kathlen Brunswick. Dossie Arbour, M.D, DABA, DABAPM, DABPM, DABIPP, FIPP

## 2015-06-02 LAB — TOXASSURE SELECT 13 (MW), URINE: PDF: 0

## 2015-06-03 DIAGNOSIS — G8929 Other chronic pain: Secondary | ICD-10-CM | POA: Insufficient documentation

## 2015-06-03 DIAGNOSIS — M545 Low back pain: Secondary | ICD-10-CM

## 2015-06-12 DIAGNOSIS — H401133 Primary open-angle glaucoma, bilateral, severe stage: Secondary | ICD-10-CM | POA: Diagnosis not present

## 2015-06-19 ENCOUNTER — Other Ambulatory Visit: Payer: Self-pay | Admitting: Pain Medicine

## 2015-07-21 ENCOUNTER — Ambulatory Visit (INDEPENDENT_AMBULATORY_CARE_PROVIDER_SITE_OTHER): Payer: Medicare Other

## 2015-07-21 VITALS — HR 52 | Resp 16 | Ht 66.0 in | Wt 141.0 lb

## 2015-07-21 DIAGNOSIS — Z23 Encounter for immunization: Secondary | ICD-10-CM | POA: Diagnosis not present

## 2015-07-21 NOTE — Patient Instructions (Signed)
Immunization only

## 2015-08-11 DIAGNOSIS — N390 Urinary tract infection, site not specified: Secondary | ICD-10-CM | POA: Diagnosis not present

## 2015-08-11 DIAGNOSIS — I129 Hypertensive chronic kidney disease with stage 1 through stage 4 chronic kidney disease, or unspecified chronic kidney disease: Secondary | ICD-10-CM | POA: Diagnosis not present

## 2015-08-11 DIAGNOSIS — E871 Hypo-osmolality and hyponatremia: Secondary | ICD-10-CM | POA: Diagnosis not present

## 2015-08-11 DIAGNOSIS — N183 Chronic kidney disease, stage 3 (moderate): Secondary | ICD-10-CM | POA: Diagnosis not present

## 2015-08-22 ENCOUNTER — Ambulatory Visit: Payer: Medicare Other | Attending: Pain Medicine | Admitting: Pain Medicine

## 2015-08-22 ENCOUNTER — Encounter: Payer: Self-pay | Admitting: Pain Medicine

## 2015-08-22 ENCOUNTER — Encounter (INDEPENDENT_AMBULATORY_CARE_PROVIDER_SITE_OTHER): Payer: Self-pay

## 2015-08-22 VITALS — BP 188/88 | HR 56 | Temp 98.2°F | Resp 17 | Ht 67.0 in | Wt 133.0 lb

## 2015-08-22 DIAGNOSIS — M7061 Trochanteric bursitis, right hip: Secondary | ICD-10-CM | POA: Insufficient documentation

## 2015-08-22 DIAGNOSIS — G8929 Other chronic pain: Secondary | ICD-10-CM

## 2015-08-22 DIAGNOSIS — M545 Low back pain, unspecified: Secondary | ICD-10-CM

## 2015-08-22 DIAGNOSIS — M47816 Spondylosis without myelopathy or radiculopathy, lumbar region: Secondary | ICD-10-CM

## 2015-08-22 DIAGNOSIS — M47896 Other spondylosis, lumbar region: Secondary | ICD-10-CM | POA: Diagnosis not present

## 2015-08-22 DIAGNOSIS — M48061 Spinal stenosis, lumbar region without neurogenic claudication: Secondary | ICD-10-CM | POA: Insufficient documentation

## 2015-08-22 DIAGNOSIS — M25559 Pain in unspecified hip: Secondary | ICD-10-CM | POA: Diagnosis present

## 2015-08-22 DIAGNOSIS — M7918 Myalgia, other site: Secondary | ICD-10-CM | POA: Insufficient documentation

## 2015-08-22 DIAGNOSIS — M1611 Unilateral primary osteoarthritis, right hip: Secondary | ICD-10-CM | POA: Insufficient documentation

## 2015-08-22 DIAGNOSIS — M533 Sacrococcygeal disorders, not elsewhere classified: Secondary | ICD-10-CM

## 2015-08-22 DIAGNOSIS — M79604 Pain in right leg: Secondary | ICD-10-CM

## 2015-08-22 DIAGNOSIS — M549 Dorsalgia, unspecified: Secondary | ICD-10-CM | POA: Diagnosis present

## 2015-08-22 DIAGNOSIS — M5416 Radiculopathy, lumbar region: Secondary | ICD-10-CM

## 2015-08-22 DIAGNOSIS — M4317 Spondylolisthesis, lumbosacral region: Secondary | ICD-10-CM | POA: Insufficient documentation

## 2015-08-22 DIAGNOSIS — M25551 Pain in right hip: Secondary | ICD-10-CM

## 2015-08-22 MED ORDER — ROPIVACAINE HCL 2 MG/ML IJ SOLN
INTRAMUSCULAR | Status: AC
  Administered 2015-08-22: 9 mL
  Filled 2015-08-22: qty 10

## 2015-08-22 MED ORDER — LIDOCAINE HCL (PF) 1 % IJ SOLN: 10.0000 mL | Freq: Once | INTRAMUSCULAR | Status: DC

## 2015-08-22 MED ORDER — TRIAMCINOLONE ACETONIDE 40 MG/ML IJ SUSP
INTRAMUSCULAR | Status: AC
  Administered 2015-08-22: 40 mg
  Filled 2015-08-22: qty 1

## 2015-08-22 MED ORDER — TRIAMCINOLONE ACETONIDE 40 MG/ML IJ SUSP
40.0000 mg | Freq: Once | INTRAMUSCULAR | Status: AC
  Administered 2015-08-22: 40 mg

## 2015-08-22 MED ORDER — ROPIVACAINE HCL 2 MG/ML IJ SOLN
9.0000 mL | Freq: Once | INTRAMUSCULAR | Status: AC
Start: 2015-08-22 — End: 2015-08-22
  Administered 2015-08-22: 9 mL

## 2015-08-22 NOTE — Progress Notes (Signed)
Patient's Name: Brittney Simpson MRN: [id number] DOB: 07-28-1928 DOS: 08/22/2015  Primary Reason(s) for Visit: Interventional Pain Management Treatment. CC: Back Pain and Hip Pain   Pre-Procedure Assessment:  Brittney Simpson is a 82 y.o. year old, female patient, seen today for interventional treatment. She has Chronic pain; Long term current use of opiate analgesic; Long term prescription opiate use; Opiate use; Encounter for therapeutic drug level monitoring; Lumbar facet syndrome (Bilateral) (R>L); Lumbar spondylosis; and Chronic low back pain (Bilateral) (R>L) on her problem list.. Her primarily concern today is the Back Pain and Hip Pain   The patient comes in today clinics today with pain down both sides of the lower back and right hip. She would like to have only the right side done. This low back pain in the hip pain seems to be exacerbated by hyperextension and rotation, highly suggestive of lumbar facet syndrome. The patient has severe spondylosis of the lumbar spine with evidence of a "kissing spine syndrome".  Today's initial pain score was an 8/10. Postprocedure Pain Score: 0-No pain Reported level of pain is compatible with clinical observation Pain Type: Chronic pain Pain Location: Back Pain Orientation: Lower Pain Descriptors / Indicators: Aching Pain Frequency: Constant  Date of Last Visit: 05/24/15 Service Provided on Last Visit: Med Refill  Verification of the correct person, correct site (including marking of site), and correct procedure were performed and confirmed by the patient.  Today's Vitals   08/22/15 1036 08/22/15 1041 08/22/15 1046 08/22/15 1047  BP: 186/83 197/90 189/88 188/88  Pulse: 54 56 55 56  Temp:      Resp: _0 Height:      Weight:      SpO2: 95% 95% 95% 95%  PainSc:    0-No pain  PainLoc:      Calculated BMI: Body mass index is 20.83 kg/(m^2). Allergies: She is allergic to sulfa antibiotics.. Primary Diagnosis: Facet syndrome, lumbar  [M54.5]  Procedure:  Type: Diagnostic Medial Branch Facet Block Region: Lumbar Level: L2, L3, L4, L5, & S1 Medial Branch Level(s) Laterality: Right  Indications: 1. Lumbar facet syndrome (Bilateral) (R>L)   2. Chronic low back pain   3. Other osteoarthritis of spine, lumbar region     In addition, Ms. Bencosme has Chronic pain; Lumbar facet syndrome (Bilateral) (R>L); Lumbar spondylosis; and Chronic low back pain (Bilateral) (R>L) on her pertinent problem list.  Consent: Secured. Under the influence of no sedatives a written informed consent was obtained, after having provided information on the risks and possible complications. To fulfill our ethical and legal obligations, as recommended by the American Medical Association's Code of Ethics, we have provided information to the patient about our clinical impression; the nature and purpose of the treatment or procedure; the risks, benefits, and possible complications of the intervention; alternatives; the risk(s) and benefit(s) of the alternative treatment(s) or procedure(s); and the risk(s) and benefit(s) of doing nothing. The patient was provided information about the risks and possible complications associated with the procedure. In the case of spinal procedures these may include, but are not limited to, failure to achieve desired goals, infection, bleeding, organ or nerve damage, allergic reactions, paralysis, and death. In addition, the patient was informed that Medicine is not an exact science; therefore, there is also the possibility of unforeseen risks and possible complications that may result in a catastrophic outcome. The patient indicated having understood very clearly. We have given the patient no guarantees and we have made no promises. Enough time  was given to the patient to ask questions, all of which were answered to the patient's satisfaction.  Pre-Procedure Preparation: Safety Precautions: Allergies reviewed. Appropriate site,  procedure, and patient were confirmed by following the Joint Commission's Universal Protocol (UP.01.01.01), in the form of a "Time Out". The patient was asked to confirm marked site and procedure, before commencing. The patient was asked about blood thinners, or active infections, both of which were denied. Patient was assessed for positional comfort and all pressure points were checked before starting procedure. Monitoring:  As per clinic protocol. Infection Control Precautions: Sterile technique used. Standard Universal Precautions were taken as recommended by the Department of [facility] for Disease Control and Prevention (CDC). Standard pre-surgical skin prep was conducted. Respiratory hygiene and cough etiquette was practiced. Hand hygiene observed. Safe injection practices and needle disposal techniques followed. SDV (single dose vial) medications used. Medications properly checked for expiration dates and contaminants. Personal protective equipment (PPE) used: Sterile double glove technique. Radiation resistant gloves. Sterile surgical gloves.  Anesthesia, Analgesia, Anxiolysis: Type: Local Anesthesia Local Anesthetic: Lidocaine 1% Route: Infiltration ([place]/IM) IV Access: Declined Sedation: Declined  Indication(s): Analgesia    Description of Procedure Process:  Time-out: "Time-out" completed before starting procedure, as per protocol. Position: Prone Target Area: For Lumbar Facet blocks, the target is the groove formed by the junction of the transverse process and superior articular process. For the L5 dorsal ramus, the target is the notch between superior articular process and sacral ala. For the S1 dorsal ramus, the target is the superior and lateral edge of the posterior S1 Sacral foramen. Approach: Paramedial approach. Area Prepped: Entire Posterior Lumbosacral Region Prepping solution: ChloraPrep (2% chlorhexidine gluconate and 70% isopropyl alcohol) Safety Precautions:  Aspiration looking for blood return was conducted prior to all injections. At no point did we inject any substances, as a needle was being advanced. No attempts were made at seeking any paresthesias. Safe injection practices and needle disposal techniques used. Medications properly checked for expiration dates. SDV (single dose vial) medications used.   Description of the Procedure: Protocol guidelines were followed. The patient was placed in position over the fluoroscopy table. The target area was identified and the area prepped in the usual manner. Skin desensitized using vapocoolant spray. Skin & deeper tissues infiltrated with local anesthetic. Appropriate amount of time allowed to pass for local anesthetics to take effect. The procedure needle was introduced through the skin, ipsilateral to the reported pain, and advanced to the target area. Employing the "Medial Branch Technique", the needles were advanced to the angle made by the superior and medial portion of the transverse process, and the lateral and inferior portion of the superior articulating process of the targeted vertebral bodies. This area is known as "Burton's Eye" or the "Eye of the [place] Dog". A procedure needle was introduced through the skin, and this time advanced to the angle made by the superior and medial border of the sacral ala, and the lateral border of the S1 vertebral body. This last needle was later repositioned at the superior and lateral border of the posterior S1 foramen. Negative aspiration confirmed. Solution injected in intermittent fashion, asking for systemic symptoms every 0.5cc of injectate. The needles were then removed and the area cleansed, making sure to leave some of the prepping solution back to take advantage of its long term bactericidal properties. EBL: None Materials & Medications Used:  Needle(s) Used: 25g - 3.5" Spinal Needle(s) Medications Administered today: We administered ropivacaine (PF) 2 mg/ml  (  0.2%), triamcinolone acetonide, ropivacaine (PF) 2 mg/ml (0.2%), and triamcinolone acetonide.Please see chart orders for dosing details.  Imaging Guidance:  Type of Imaging Technique: Fluoroscopy Guidance (Spinal) Indication(s): Assistance in needle guidance and placement for procedures requiring needle placement in or near specific anatomical locations not easily accessible without such assistance. Exposure Time: Please see nurses notes. Contrast: None required. Fluoroscopic Guidance: I was personally present in the fluoroscopy suite, where the patient was placed in position for the procedure, over the fluoroscopy-compatible table. Fluoroscopy was manipulated, using "Tunnel Vision Technique", to obtain the best possible view of the target area, on the affected side. Parallax error was corrected before commencing the procedure. A "direction-depth-direction" technique was used to introduce the needle under continuous pulsed fluoroscopic guidance. Once the target was reached, antero-posterior, oblique, and lateral fluoroscopic projection views were taken to confirm needle placement in all planes. Permanently recorded images stored by scanning into EMR. Interpretation: Intraoperative imaging interpretation by performing Physician. Adequate needle placement confirmed. Adequate needle placement confirmed in AP, lateral, & Oblique Views. No contrast injected.  Antibiotics:  Type:  Antibiotics Given (last 72 hours)    None      Indication(s): No indications identified.  Post-operative Assessment:  Complications: No immediate post-treatment complications were observed. Disposition: Return to clinic for follow-up evaluation. The patient tolerated the entire procedure well. A repeat set of vitals were taken after the procedure and the patient was kept under observation following institutional policy, for this procedure. Post-procedural neurological assessment was performed, showing return to baseline,  prior to discharge. The patient was discharged home, once institutional criteria were met. The patient was provided with post-procedure discharge instructions, including a section on how to identify potential problems. Should any problems arise concerning this procedure, the patient was given instructions to immediately contact us, at any time, without hesitation. In any case, we plan to contact the patient by telephone for a follow-up status report regarding this interventional procedure. Comments:  No additional relevant information.  Primary Care Physician: Dicky Doe, MD Location: [facility] Outpatient Pain Management Facility Note by: Kathlen Brunswick. Dossie Arbour, M.D, DABA, DABAPM, DABPM, DABIPP, FIPP   Illustration of the posterior view of the lumbar spine and the posterior neural structures. Laminae of L2 through S1 are labeled. DPRL5, dorsal primary ramus of L5; DPRS1, dorsal primary ramus of S1; DPR3, dorsal primary ramus of L3; FJ, facet (zygapophyseal) joint L3-L4; I, inferior articular process of L4; LB1, lateral branch of dorsal primary ramus of L1; IAB, inferior articular branches from L3 medial branch (supplies L4-L5 facet joint); IBP, intermediate branch plexus; MB3, medial branch of dorsal primary ramus of L3; NR3, third lumbar nerve root; S, superior articular process of L5; SAB, superior articular branches from L4 (supplies L4-5 facet joint also); TP3, transverse process of L3.  Disclaimer:  Medicine is not an Chief Strategy Officer. The only guarantee in medicine is that nothing is guaranteed. It is important to note that the decision to proceed with this intervention was based on the information collected from the patient. The Data and conclusions were drawn from the patient's questionnaire, the interview, and the physical examination. Because the information was provided in large part by the patient, it cannot be guaranteed that it has not been purposely or unconsciously manipulated. Every effort  has been made to obtain as much relevant data as possible for this evaluation. It is important to note that the conclusions that lead to this procedure are derived in large part from the available data. Always take into  account that the treatment will also be dependent on availability of resources and existing treatment guidelines, considered by other Pain Management Practitioners as being common knowledge and practice, at the time of the intervention. For Medico-Legal purposes, it is also important to point out that variation in procedural techniques and pharmacological choices are the acceptable norm. The indications, contraindications, technique, and results of the above procedure should only be interpreted and judged by a Board-Certified Interventional Pain Specialist with extensive familiarity and expertise in the same exact procedure and technique. Attempts at providing opinions without similar or greater experience and expertise than that of the treating physician will be considered as inappropriate and unethical, and shall result in a formal complaint to the state medical board and applicable specialty societies.

## 2015-08-22 NOTE — Patient Instructions (Signed)
Facet Blocks Patient Information  Description: The facets are joints in the spine between the vertebrae.  Like any joints in the body, facets can become irritated and painful.  Arthritis can also effect the facets.  By injecting steroids and local anesthetic in and around these joints, we can temporarily block the nerve supply to them.  Steroids act directly on irritated nerves and tissues to reduce selling and inflammation which often leads to decreased pain.  Facet blocks may be done anywhere along the spine from the neck to the low back depending upon the location of your pain.   After numbing the skin with local anesthetic (like Novocaine), a small needle is passed onto the facet joints under x-ray guidance.  You may experience a sensation of pressure while this is being done.  The entire block usually lasts about 15-25 minutes.   Conditions which may be treated by facet blocks:   Low back/buttock pain  Neck/shoulder pain  Certain types of headaches  Preparation for the injection:  1. Do not eat any solid food or dairy products within 6 hours of your appointment. 2. You may drink clear liquid up to 2 hours before appointment.  Clear liquids include water, black coffee, juice or soda.  No milk or cream please. 3. You may take your regular medication, including pain medications, with a sip of water before your appointment.  Diabetics should hold regular insulin (if taken separately) and take 1/2 normal NPH dose the morning of the procedure.  Carry some sugar containing items with you to your appointment. 4. A driver must accompany you and be prepared to drive you home after your procedure. 5. Bring all your current medications with you. 6. An IV may be inserted and sedation may be given at the discretion of the physician. 7. A blood pressure cuff, EKG and other monitors will often be applied during the procedure.  Some patients may need to have extra oxygen administered for a short  period. 8. You will be asked to provide medical information, including your allergies and medications, prior to the procedure.  We must know immediately if you are taking blood thinners (like Coumadin/Warfarin) or if you are allergic to IV iodine contrast (dye).  We must know if you could possible be pregnant.  Possible side-effects:   Bleeding from needle site  Infection (rare, may require surgery)  Nerve injury (rare)  Numbness & tingling (temporary)  Difficulty urinating (rare, temporary)  Spinal headache (a headache worse with upright posture)  Light-headedness (temporary)  Pain at injection site (serveral days)  Decreased blood pressure (rare, temporary)  Weakness in arm/leg (temporary)  Pressure sensation in back/neck (temporary)   Call if you experience:   Fever/chills associated with headache or increased back/neck pain  Headache worsened by an upright position  New onset, weakness or numbness of an extremity below the injection site  Hives or difficulty breathing (go to the emergency room)  Inflammation or drainage at the injection site(s)  Severe back/neck pain greater than usual  New symptoms which are concerning to you  Please note:  Although the local anesthetic injected can often make your back or neck feel good for several hours after the injection, the pain will likely return. It takes 3-7 days for steroids to work.  You may not notice any pain relief for at least one week.  If effective, we will often do a series of 2-3 injections spaced 3-6 weeks apart to maximally decrease your pain.  After the initial   series, you may be a candidate for a more permanent nerve block of the facets.  If you have any questions, please call [phone] [facility] Dis[facility] Inst[facility]  General Discharge Instructions :  If you need to reach your doctor call: Monday-Friday 8:00 am - 4:00 pm at [phone] or  toll free [phone].  After clinic hours [phone] to have operator reach doctor.  Bring all of your medication bottles to all your appointments in the pain clinic.  To cancel or reschedule your appointment with Pain Management please remember to call 24 hours in advance to avoid a fee.  Refer to the educational materials which you have been given on: General Risks, I had my Procedure. Discharge Instructions, Post Sedation.  Post Procedure Instructions:  The drugs you were given will stay in your system until tomorrow, so for the next 24 hours you should not drive, make any legal decisions or drink any alcoholic beverages.  You may eat anything you prefer, but it is better to start with liquids then soups and crackers, and gradually work up to solid foods.  Please notify your doctor immediately if you have any unusual bleeding, trouble breathing or pain that is not related to your normal pain.  Depending on the type of procedure that was done, some parts of your body may feel week and/or numb.  This usually clears up by tonight or the next day.  Walk with the use of an assistive device or accompanied by an adult for the 24 hours.  You may use ice on the affected area for the first 24 hours.  Put ice in a Ziploc bag and cover with a towel and place against area 15 minutes on 15 minutes off.  You may switch to heat after 24 hours. 

## 2015-08-22 NOTE — Progress Notes (Signed)
Safety precautions to be maintained throughout the outpatient stay will include: orient to surroundings, keep bed in low position, maintain call bell within reach at all times, provide assistance with transfer out of bed and ambulation.  

## 2015-08-23 ENCOUNTER — Telehealth: Payer: Self-pay | Admitting: *Deleted

## 2015-08-23 NOTE — Telephone Encounter (Signed)
Spoke with Arbie Cookey, daughter of Mrs Fredrich, she states that her back is sore, but verbalizes no complications from procedure.  Is going to call and check with her this morning and will let us know if anything has changed.

## 2015-08-24 ENCOUNTER — Other Ambulatory Visit: Payer: Self-pay | Admitting: Family Medicine

## 2015-09-12 ENCOUNTER — Ambulatory Visit: Payer: Medicare Other | Admitting: Pain Medicine

## 2015-09-18 ENCOUNTER — Other Ambulatory Visit: Payer: Self-pay | Admitting: Family Medicine

## 2015-09-19 ENCOUNTER — Other Ambulatory Visit: Payer: Self-pay | Admitting: Family Medicine

## 2015-09-21 ENCOUNTER — Other Ambulatory Visit: Payer: Self-pay | Admitting: Family Medicine

## 2015-09-26 ENCOUNTER — Ambulatory Visit: Payer: Medicare Other | Attending: Pain Medicine | Admitting: Pain Medicine

## 2015-09-26 ENCOUNTER — Encounter: Payer: Self-pay | Admitting: Pain Medicine

## 2015-09-26 VITALS — BP 176/62 | HR 58 | Temp 98.1°F | Resp 16 | Ht 66.0 in | Wt 134.0 lb

## 2015-09-26 DIAGNOSIS — Z79891 Long term (current) use of opiate analgesic: Secondary | ICD-10-CM | POA: Insufficient documentation

## 2015-09-26 DIAGNOSIS — M4806 Spinal stenosis, lumbar region: Secondary | ICD-10-CM | POA: Insufficient documentation

## 2015-09-26 DIAGNOSIS — E039 Hypothyroidism, unspecified: Secondary | ICD-10-CM | POA: Insufficient documentation

## 2015-09-26 DIAGNOSIS — M47816 Spondylosis without myelopathy or radiculopathy, lumbar region: Secondary | ICD-10-CM

## 2015-09-26 DIAGNOSIS — M7061 Trochanteric bursitis, right hip: Secondary | ICD-10-CM | POA: Diagnosis not present

## 2015-09-26 DIAGNOSIS — F419 Anxiety disorder, unspecified: Secondary | ICD-10-CM | POA: Insufficient documentation

## 2015-09-26 DIAGNOSIS — M5416 Radiculopathy, lumbar region: Secondary | ICD-10-CM | POA: Diagnosis not present

## 2015-09-26 DIAGNOSIS — M545 Low back pain: Secondary | ICD-10-CM

## 2015-09-26 DIAGNOSIS — M533 Sacrococcygeal disorders, not elsewhere classified: Secondary | ICD-10-CM | POA: Insufficient documentation

## 2015-09-26 DIAGNOSIS — M1611 Unilateral primary osteoarthritis, right hip: Secondary | ICD-10-CM | POA: Insufficient documentation

## 2015-09-26 DIAGNOSIS — E785 Hyperlipidemia, unspecified: Secondary | ICD-10-CM | POA: Insufficient documentation

## 2015-09-26 DIAGNOSIS — G8929 Other chronic pain: Secondary | ICD-10-CM | POA: Insufficient documentation

## 2015-09-26 DIAGNOSIS — M79604 Pain in right leg: Secondary | ICD-10-CM | POA: Insufficient documentation

## 2015-09-26 DIAGNOSIS — M4317 Spondylolisthesis, lumbosacral region: Secondary | ICD-10-CM | POA: Insufficient documentation

## 2015-09-26 DIAGNOSIS — I10 Essential (primary) hypertension: Secondary | ICD-10-CM | POA: Diagnosis not present

## 2015-09-26 DIAGNOSIS — M8580 Other specified disorders of bone density and structure, unspecified site: Secondary | ICD-10-CM | POA: Insufficient documentation

## 2015-09-26 DIAGNOSIS — M25551 Pain in right hip: Secondary | ICD-10-CM | POA: Diagnosis not present

## 2015-09-26 DIAGNOSIS — M549 Dorsalgia, unspecified: Secondary | ICD-10-CM | POA: Diagnosis present

## 2015-09-26 NOTE — Assessment & Plan Note (Signed)
This was confirmed through diagnostic lumbar facet block under fluoroscopic guidance and IV sedation.

## 2015-09-26 NOTE — Progress Notes (Signed)
Currently being treated for UTI

## 2015-09-26 NOTE — Assessment & Plan Note (Signed)
This right hip pain has been confirmed to be secondary to the lumbar facet syndrome. Direct injection into the hip joint did not provide the patient with 100% relief of the pain however, right-sided lumbar facet block did provide her with 100% relief of the pain indicating that the right hip pain is referred from the lumbar facets.

## 2015-09-26 NOTE — Progress Notes (Signed)
Patient's Name: Brittney Simpson MRN: [id number] DOB: 1928/01/26 DOS: 09/26/2015  Primary Reason(s) for Visit: Post-Procedure evaluation CC: Back Pain   HPI  Brittney Simpson is a 82 y.o. year old, female patient, who returns today as an established patient. She has Chronic pain; Long term current use of opiate analgesic; Long term prescription opiate use; Opiate use; Encounter for therapeutic drug level monitoring; Lumbar facet syndrome (Bilateral) (R>L); Lumbar spondylosis; Chronic low back pain (Bilateral) (R>L); Spondylolisthesis of lumbosacral region (L2-3 and L5-S1); Lumbar spinal stenosis (9 mm at L3-4); Trochanteric bursitis (Right); Chronic hip pain (Right); Osteoarthritis of hip (Right); Chronic sacroiliac joint pain (Right); Chronic lumbar radicular pain (Right); Chronic lower extremity pain (Right); Myofascial pain; and Encounter for chronic pain management on her problem list.. Her primarily concern today is the Back Pain   The patient returns to the clinics today after having had a right sided diagnostic lumbar facet block under fluoroscopic guidance done on 08/22/2015. This provided the patient with 100% relief of the pain and she actually returns today indicating that she no longer has to right hip pain after that injection. This confirms that the right hip pain is referred from the lumbar spine. She still has some pain across the lower back and if she responds well to a second diagnostic injection, we can move on to doing radiofrequency ablation of her lumbar facets.  Pain Assessment: Self-Reported Pain Score: 5  Reported level is compatible with observation Pain Type: Chronic pain Pain Location: Back Pain Orientation: Lower Pain Descriptors / Indicators: Aching Pain Frequency: Constant  Date of Last Visit: 08/22/15 Service Provided on Last Visit: Procedure (right lumbar facet block)  Controlled Substance Pharmacotherapy Assessment  Analgesic: Tramadol 50 mg 1 tablet by mouth  3 times a day when necessary for pain. MME/day: 15 mg/day Pharmacokinetics: Onset of action (Liberation/Absorption): Within expected pharmacological parameters Time to Peak effect (Distribution): Timing and results are as within normal expected parameters Duration of action (Metabolism/Excretion): Within normal limits for medication Pharmacodynamics: Analgesic Effect: More than 50% Activity Facilitation: Medication(s) allow patient to sit, stand, walk, and do the basic ADLs Perceived Effectiveness: Described as relatively effective, allowing for increase in activities of daily living (ADL) Side-effects or Adverse reactions: None reported Monitoring: [place] PMP: Compliant with practice rules and regulations UDS Results/interpretation: [name] last UDS was done on 05/24/2015 and it came back within normal limits and no unexpected results. Medication Assessment Form: Reviewed. Patient indicates being compliant with therapy Treatment compliance: Compliant Risk Assessment: Substance Use Disorder (SUD) Risk Level: Low Opioid Risk Tool (ORT) Score:      Depression Scale Score:    Pharmacologic Plan: Continue therapy as is  Lab Work: Illicit Drugs No results found for: THCU, COCAINSCRNUR, PCPSCRNUR, MDMA, AMPHETMU, METHADONE, ETOH  Inflammation Markers No results found for: ESRSEDRATE, CRP  Renal Function Lab Results  Component Value Date   BUN 13 11/24/2014   CREATININE 0.81 11/24/2014   GFRAA >60 11/24/2014   GFRNONAA >60 11/24/2014    Hepatic Function Lab Results  Component Value Date   AST 26 07/08/2014   ALT 25 07/08/2014   ALBUMIN 3.4 07/08/2014    Electrolytes Lab Results  Component Value Date   NA 131* 11/24/2014   K 4.2 11/24/2014   CL 97* 11/24/2014   CALCIUM 8.9 11/24/2014   MG 2.1 01/14/2014    Post-Procedure Assessment  Procedure done on last visit: Diagnostic, right sided, lumbar facet block under fluoroscopic guidance and IV sedation. Side-effects  or Adverse reactions:  None reported Sedation: Procedure was performed with sedation  Results: Ultra-Short Term Relief (First 1 hour after procedure): 50 % (although the patient indicates 50%, our note reveals that she had 100% relief of the pain when she left the clinic) Possibly the results is influenced by the pharmacodynamic effect of the local anesthetic, as well as that of the intravenous analgesics and/or sedatives, when used Short Term Relief (Initial 4-6 hrs after procedure): 100 % Short-term relief confirms injected site to be the source of pain Long Term Relief : 50 % (low back is 50% better, right hip pain is 100%) Long-term benefit would suggest an inflammatory etiology to the pain   Current Relief (Now):  100% of the right hip pain and 50% of her low back pain. Recurrance of pain could suggest persistent aggravating factors Interpretation of Results: The results of this diagnostic injection confirmed that what we are dealing with is a facet syndrome. Eventually we will repeat the procedure and if she gets the same type of relief, but it doesn't last, then we will proceed with radiofrequency ablation.  Allergies  Brittney Simpson is allergic to sulfa antibiotics.  Meds  The patient has a current medication list which includes the following prescription(s): amlodipine, brimonidine, ciprofloxacin, clonazepam, vitamin d2, furosemide, latanoprost, levothyroxine, lisinopril, metoprolol succinate, omeprazole, tramadol, and trazodone.  Current Outpatient Prescriptions on File Prior to Visit  Medication Sig  . amLODipine (NORVASC) 5 MG tablet 5 mg daily.   . clonazePAM (KLONOPIN) 0.5 MG tablet TAKE 1/2 TABLET BY MOUTH TWICE DAILY FOR ANXIETY.  . Ergocalciferol (VITAMIN D2) 2000 UNITS TABS Take 1 capsule by mouth.  . furosemide (LASIX) 20 MG tablet Take 20 mg by mouth daily.  Marland Kitchen levothyroxine (SYNTHROID, LEVOTHROID) 125 MCG tablet Take 1 tablet (125 mcg total) by mouth daily before breakfast.   . lisinopril (PRINIVIL,ZESTRIL) 30 MG tablet Take 1 tablet (30 mg total) by mouth daily.  . metoprolol succinate (TOPROL-XL) 100 MG 24 hr tablet Take 1 tablet (100 mg total) by mouth 2 (two) times daily.  Marland Kitchen omeprazole (PRILOSEC) 20 MG capsule Take 1 capsule (20 mg total) by mouth daily.  . traMADol (ULTRAM) 50 MG tablet Take 1 tablet (50 mg total) by mouth 3 (three) times daily as needed for moderate pain.  . traZODone (DESYREL) 50 MG tablet TAKE 1/2 TO 1 TABLET BY MOUTH AT BEDTIMEAS NEEDED FOR SLEEP   No current facility-administered medications on file prior to visit.    ROS  Constitutional: Afebrile, no chills, well hydrated and well nourished Gastrointestinal: negative Musculoskeletal:negative Neurological: negative Behavioral/Psych: negative  PFSH  Medical:  Ms. Geary  has a past medical history of Back ache; Hyperlipidemia; Hypertension; Thyroid disease; Anxiety; Osteopenia; Hiatal hernia; and Hypothyroid. Family: family history includes Cancer in her father; Stroke in her mother. Surgical:  has past surgical history that includes Hernia repair; Hernia repair; and Hemorrhoid surgery. Tobacco:  reports that she has never smoked. She has never used smokeless tobacco. Alcohol:  reports that she does not drink alcohol. Drug:  reports that she does not use illicit drugs.  Physical Exam  Vitals:  Today's Vitals   09/26/15 1423  BP: 176/62  Pulse: 58  Temp: 98.1 F (36.7 C)  Resp: 16  Height: 5\' 6"  (1.676 m)  Weight: 134 lb (60.782 kg)  SpO2: 98%  PainSc: 5   PainLoc: Back    Calculated BMI: Body mass index is 21.64 kg/(m^2).  General appearance: alert, cooperative, appears stated age and no distress. She is  also hard of hearing. Eyes: PERLA Respiratory: No evidence respiratory distress, no audible rales or ronchi and no use of accessory muscles of respiration Lumbar Spine Exam  Inspection: No gross anomalies detected Alignment: Symetrical Palpation: Tender ROM:   Flexion: Decreased ROM Extension: Decreased ROM Lateral Bending: Decreased ROM Rotation: Decreased ROM Provocative Tests:  Lumbar Hyperextension and rotation test:  Positive for facet pain bilaterally Patrick's Maneuver: deferred  Gait Evaluation  Gait: Antalgic (limping)  Lower Extremity Exam  Inspection: No gross anomalies detected ROM: Limited due to guarding Sensory:  Normal Motor: Unremarkable  Toe walk (S1): WNL  Heal walk (L5): WNL   Assessment & Plan  Primary Diagnosis & Pertinent Problem List: The primary encounter diagnosis was Chronic low back pain (Bilateral) (R>L). Diagnoses of Chronic lower extremity pain (Right), Lumbar facet syndrome (Bilateral) (R>L), and Chronic hip pain (Right) were also pertinent to this visit.  Visit Diagnosis: 1. Chronic low back pain (Bilateral) (R>L)   2. Chronic lower extremity pain (Right)   3. Lumbar facet syndrome (Bilateral) (R>L)   4. Chronic hip pain (Right)     Problem-specific Plan(s): Lumbar facet syndrome (Bilateral) (R>L) This was confirmed through diagnostic lumbar facet block under fluoroscopic guidance and IV sedation.  Chronic hip pain (Right) This right hip pain has been confirmed to be secondary to the lumbar facet syndrome. Direct injection into the hip joint did not provide the patient with 100% relief of the pain however, right-sided lumbar facet block did provide her with 100% relief of the pain indicating that the right hip pain is referred from the lumbar facets.    Plan of Care  Pharmacotherapy (Medications Ordered): No orders of the defined types were placed in this encounter.    Lab-work & Procedure Ordered: Orders Placed This Encounter  Procedures  . LUMBAR FACET(MEDIAL BRANCH NERVE BLOCK) MBNB    Standing Status: Standing     Number of Occurrences: 1     Standing Expiration Date: 09/25/2016    Scheduling Instructions:     Side: Bilateral     Level: L2, L3, L4, L5, & S1 Medial Branch Nerve      Sedation: With Sedation.     Timeframe: PRN Procedure. Patient will call to schedule.    Order Specific Question:  Where will this procedure be performed?    Answer:  [facility]    Imaging Ordered: None  Interventional Therapies: Scheduled: None at this time. PRN Procedures: Bilateral lumbar facet block under fluoroscopic guidance and IV sedation. If she again does well with this, then we will move on to radiofrequency ablation.    Referral(s) or Consult(s): None at this time.  Medications administered during this visit: Ms. Constantineau had no medications administered during this visit.  Future Appointments Date Time Provider [place]  10/11/2015 3:00 PM Arlis Porta., MD [facility] None  11/22/2015 2:00 PM Milinda Pointer, MD [facility] None    Primary Care Physician: Dicky Doe, MD Location: [facility] Note by: Kathlen Brunswick. Dossie Arbour, M.D, DABA, DABAPM, DABPM, DABIPP, FIPP  Pain Score Disclaimer: We use the NRS-11 scale. This is a self-reported, subjective measurement of pain severity with only modest accuracy. It is used primarily to identify changes within a particular patient. It must be understood that outpatient pain scales are significantly less accurate that those used for research, where they can be applied under ideal controlled circumstances with minimal exposure to variables. In reality, the score is likely to be a combination of  pain intensity and pain affect, where pain affect describes the degree of emotional arousal or changes in action readiness caused by the sensory experience of pain. Factors such as social and work situation, setting, emotional state, anxiety levels, expectation, and prior pain experience may influence pain perception and show large inter-individual differences that may also be affected by time variables.

## 2015-09-29 ENCOUNTER — Ambulatory Visit: Payer: Medicare Other | Admitting: Family Medicine

## 2015-10-03 ENCOUNTER — Ambulatory Visit: Payer: Medicare Other | Admitting: Family Medicine

## 2015-10-10 ENCOUNTER — Ambulatory Visit: Payer: Medicare Other | Admitting: Family Medicine

## 2015-10-11 ENCOUNTER — Ambulatory Visit (INDEPENDENT_AMBULATORY_CARE_PROVIDER_SITE_OTHER): Payer: Medicare Other | Admitting: Family Medicine

## 2015-10-11 ENCOUNTER — Encounter: Payer: Self-pay | Admitting: Family Medicine

## 2015-10-11 VITALS — BP 150/60 | HR 60 | Resp 16 | Ht 66.0 in | Wt 141.6 lb

## 2015-10-11 DIAGNOSIS — L989 Disorder of the skin and subcutaneous tissue, unspecified: Secondary | ICD-10-CM

## 2015-10-11 DIAGNOSIS — G4700 Insomnia, unspecified: Secondary | ICD-10-CM

## 2015-10-11 DIAGNOSIS — I10 Essential (primary) hypertension: Secondary | ICD-10-CM | POA: Insufficient documentation

## 2015-10-11 DIAGNOSIS — I129 Hypertensive chronic kidney disease with stage 1 through stage 4 chronic kidney disease, or unspecified chronic kidney disease: Secondary | ICD-10-CM | POA: Insufficient documentation

## 2015-10-11 DIAGNOSIS — N183 Chronic kidney disease, stage 3 unspecified: Secondary | ICD-10-CM

## 2015-10-11 DIAGNOSIS — F419 Anxiety disorder, unspecified: Secondary | ICD-10-CM

## 2015-10-11 DIAGNOSIS — G8929 Other chronic pain: Secondary | ICD-10-CM | POA: Diagnosis not present

## 2015-10-11 MED ORDER — CLONAZEPAM 0.5 MG PO TABS: ORAL_TABLET | ORAL | Status: DC

## 2015-10-11 MED ORDER — TRAZODONE HCL 50 MG PO TABS: ORAL_TABLET | ORAL | Status: DC

## 2015-10-11 NOTE — Progress Notes (Signed)
Name: Brittney Simpson   MRN: [id number]    DOB: 06-24-1928   Date:10/11/2015       Progress Note  Subjective  Chief Complaint  Chief Complaint  Patient presents with  . Hypertension    need refill on Trazadone and Clonazepam and [place] WILL allow Escript for both.     HPI Here for f/u of HBP and anxiety and insomnnia.  She sees Dr. Salomon Mast for chronic pain management and Dr. Abigail Butts for Nephrology.   Lab from Dr. Abigail Butts were reported as "all good" at her last visit there a few weeks ago.   Daughter insists that she needs the Trazadone and Clonazepam .  It allows her to not  need supervision , esp. At night.  She still lives alone. No problem-specific assessment & plan notes found for this encounter.   Past Medical History  Diagnosis Date  . Back ache   . Hyperlipidemia   . Hypertension   . Thyroid disease   . Anxiety   . Osteopenia   . Hiatal hernia   . Hypothyroid     Past Surgical History  Procedure Laterality Date  . Hernia repair    . Hernia repair    . Hemorrhoid surgery      Family History  Problem Relation Age of Onset  . Stroke Mother   . Cancer Father     Social History   Social History  . Marital Status: Widowed    Spouse Name: N/A  . Number of Children: N/A  . Years of Education: N/A   Occupational History  . Not on file.   Social History Main Topics  . Smoking status: Never Smoker   . Smokeless tobacco: Never Used  . Alcohol Use: No  . Drug Use: No  . Sexual Activity: Yes    Birth Control/ Protection: Injection   Other Topics Concern  . Not on file   Social History Narrative     Current outpatient prescriptions:  .  amLODipine (NORVASC) 5 MG tablet, 5 mg daily. , Disp: , Rfl:  .  brimonidine (ALPHAGAN) 0.2 % ophthalmic solution, , Disp: , Rfl:  .  clonazePAM (KLONOPIN) 0.5 MG tablet, TAKE 1/2 TABLET BY MOUTH TWICE DAILY FOR ANXIETY., Disp: 30 tablet, Rfl: 5 .  Ergocalciferol (VITAMIN D2) 2000 UNITS TABS, Take 1 capsule by  mouth., Disp: , Rfl:  .  furosemide (LASIX) 20 MG tablet, Take 20 mg by mouth daily., Disp: , Rfl:  .  latanoprost (XALATAN) 0.005 % ophthalmic solution, , Disp: , Rfl:  .  levothyroxine (SYNTHROID, LEVOTHROID) 125 MCG tablet, Take 1 tablet (125 mcg total) by mouth daily before breakfast., Disp: 30 tablet, Rfl: 6 .  lisinopril (PRINIVIL,ZESTRIL) 30 MG tablet, Take 1 tablet (30 mg total) by mouth daily., Disp: 30 tablet, Rfl: 6 .  metoprolol succinate (TOPROL-XL) 100 MG 24 hr tablet, Take 1 tablet (100 mg total) by mouth 2 (two) times daily., Disp: 60 tablet, Rfl: 6 .  omeprazole (PRILOSEC) 20 MG capsule, Take 1 capsule (20 mg total) by mouth daily., Disp: 30 capsule, Rfl: 6 .  sulfamethoxazole-trimethoprim (BACTRIM,SEPTRA) 400-80 MG tablet, , Disp: , Rfl:  .  traMADol (ULTRAM) 50 MG tablet, Take 1 tablet (50 mg total) by mouth 3 (three) times daily as needed for moderate pain., Disp: 90 tablet, Rfl: 5 .  traZODone (DESYREL) 50 MG tablet, TAKE 1/2 TO 1 TABLET BY MOUTH AT BEDTIMEAS NEEDED FOR SLEEP, Disp: 30 tablet, Rfl: 5  Allergies  Allergen Reactions  .  Sulfa Antibiotics Anaphylaxis     Review of Systems  Constitutional: Negative for fever, chills, weight loss and malaise/fatigue.  HENT: Negative for hearing loss.   Eyes: Negative for blurred vision and double vision.  Respiratory: Negative for cough, shortness of breath and wheezing.   Cardiovascular: Negative for chest pain, palpitations and leg swelling.  Gastrointestinal: Negative for heartburn, abdominal pain and blood in stool.  Genitourinary: Negative for dysuria, urgency and frequency.  Musculoskeletal: Negative for myalgias.  Skin: Negative for rash.  Neurological: Negative for weakness and headaches.  Psychiatric/Behavioral: The patient is nervous/anxious and has insomnia.       Objective  Filed Vitals:   10/11/15 1527 10/11/15 1552  BP: 146/50 150/60  Pulse: 51 60  Resp: 16   Height: 5\' 6"  (1.676 m)   Weight: 141  lb 9.6 oz (64.229 kg)   SpO2: 98%     Physical Exam  Constitutional: She is oriented to person, place, and time and well-developed, well-nourished, and in no distress. No distress.  HENT:  Head: Normocephalic and atraumatic.  Hard of hearing  Eyes: Conjunctivae are normal. Pupils are equal, round, and reactive to light. No scleral icterus.  Neck: Normal range of motion. Neck supple. Carotid bruit is not present. No thyromegaly present.  Cardiovascular: Normal rate, regular rhythm and normal heart sounds.  Exam reveals no gallop and no friction rub.   No murmur heard. Pulmonary/Chest: Effort normal and breath sounds normal. No respiratory distress. She has no wheezes. She has no rales.  Abdominal: Soft. Bowel sounds are normal. She exhibits no distension and no mass. There is no tenderness.  Lymphadenopathy:    She has no cervical adenopathy.  Neurological: She is alert and oriented to person, place, and time.  Skin:  Raised, rough skin lesion on L occiput  Psychiatric: Mood, affect and judgment normal.  Vitals reviewed.      No results found for this or any previous visit (from the past 2160 hour(s)).   Assessment & Plan  Problem List Items Addressed This Visit      Cardiovascular and Mediastinum   HBP (high blood pressure)     Genitourinary   CKD (chronic kidney disease) stage 3, GFR 30-59 ml/min     Other   Skin lesion of scalp   Relevant Orders   Ambulatory referral to Dermatology   Chronic anxiety   Relevant Medications   clonazePAM (KLONOPIN) 0.5 MG tablet   traZODone (DESYREL) 50 MG tablet   Insomnia   Relevant Medications   traZODone (DESYREL) 50 MG tablet   Chronic pain - Primary (Chronic)   Relevant Medications   clonazePAM (KLONOPIN) 0.5 MG tablet   traZODone (DESYREL) 50 MG tablet      Meds ordered this encounter  Medications  . sulfamethoxazole-trimethoprim (BACTRIM,SEPTRA) 400-80 MG tablet    Sig:   . clonazePAM (KLONOPIN) 0.5 MG tablet     Sig: TAKE 1/2 TABLET BY MOUTH TWICE DAILY FOR ANXIETY.    Dispense:  30 tablet    Refill:  5  . traZODone (DESYREL) 50 MG tablet    Sig: TAKE 1/2 TO 1 TABLET BY MOUTH AT BEDTIMEAS NEEDED FOR SLEEP    Dispense:  30 tablet    Refill:  5   1. Chronic pain cont to see pain management  2. Essential hypertension Cont meds  3. CKD (chronic kidney disease) stage 3, GFR 30-59 ml/min Cont to see Dr. Abigail Butts  4. Skin lesion of scalp  - Ambulatory referral to Dermatology  5. Chronic anxiety  - clonazePAM (KLONOPIN) 0.5 MG tablet; TAKE 1/2 TABLET BY MOUTH TWICE DAILY FOR ANXIETY.  Dispense: 30 tablet; Refill: 5  6. Insomnia  - traZODone (DESYREL) 50 MG tablet; TAKE 1/2 TO 1 TABLET BY MOUTH AT BEDTIMEAS NEEDED FOR SLEEP  Dispense: 30 tablet; Refill: 5

## 2015-10-19 ENCOUNTER — Other Ambulatory Visit: Payer: Self-pay | Admitting: Family Medicine

## 2015-11-02 ENCOUNTER — Other Ambulatory Visit: Payer: Self-pay | Admitting: Family Medicine

## 2015-11-15 ENCOUNTER — Other Ambulatory Visit: Payer: Self-pay | Admitting: Family Medicine

## 2015-11-21 ENCOUNTER — Telehealth: Payer: Self-pay | Admitting: Pain Medicine

## 2015-11-21 NOTE — Telephone Encounter (Signed)
Wants to know if Brittney Simpson can have a procedure for her back, an epidural, and get her meds on the same day? Please let daughter know, she cannot come in on 26th for appt due to daughter has to go have work done on her tooth

## 2015-11-21 NOTE — Telephone Encounter (Signed)
Patients daughter unable to bring to appointment tomorrow.  Patient needs injection.  Scheduled for Facet injection.  Pre procedure instructions given.

## 2015-11-22 ENCOUNTER — Encounter: Payer: Medicare Other | Admitting: Pain Medicine

## 2015-11-28 ENCOUNTER — Ambulatory Visit: Payer: Medicare Other | Attending: Pain Medicine | Admitting: Pain Medicine

## 2015-11-28 ENCOUNTER — Encounter: Payer: Self-pay | Admitting: Pain Medicine

## 2015-11-28 VITALS — BP 171/68 | HR 64 | Temp 97.7°F | Resp 12 | Ht 66.0 in | Wt 137.0 lb

## 2015-11-28 DIAGNOSIS — M79604 Pain in right leg: Secondary | ICD-10-CM | POA: Diagnosis not present

## 2015-11-28 DIAGNOSIS — M791 Myalgia: Secondary | ICD-10-CM | POA: Insufficient documentation

## 2015-11-28 DIAGNOSIS — G4700 Insomnia, unspecified: Secondary | ICD-10-CM | POA: Diagnosis not present

## 2015-11-28 DIAGNOSIS — M533 Sacrococcygeal disorders, not elsewhere classified: Secondary | ICD-10-CM | POA: Insufficient documentation

## 2015-11-28 DIAGNOSIS — M4316 Spondylolisthesis, lumbar region: Secondary | ICD-10-CM | POA: Diagnosis not present

## 2015-11-28 DIAGNOSIS — M47816 Spondylosis without myelopathy or radiculopathy, lumbar region: Secondary | ICD-10-CM | POA: Insufficient documentation

## 2015-11-28 DIAGNOSIS — F419 Anxiety disorder, unspecified: Secondary | ICD-10-CM | POA: Insufficient documentation

## 2015-11-28 DIAGNOSIS — M549 Dorsalgia, unspecified: Secondary | ICD-10-CM | POA: Diagnosis present

## 2015-11-28 DIAGNOSIS — I129 Hypertensive chronic kidney disease with stage 1 through stage 4 chronic kidney disease, or unspecified chronic kidney disease: Secondary | ICD-10-CM | POA: Diagnosis not present

## 2015-11-28 DIAGNOSIS — M545 Low back pain, unspecified: Secondary | ICD-10-CM

## 2015-11-28 DIAGNOSIS — Z79891 Long term (current) use of opiate analgesic: Secondary | ICD-10-CM | POA: Diagnosis not present

## 2015-11-28 DIAGNOSIS — G8929 Other chronic pain: Secondary | ICD-10-CM | POA: Insufficient documentation

## 2015-11-28 DIAGNOSIS — M25551 Pain in right hip: Secondary | ICD-10-CM | POA: Insufficient documentation

## 2015-11-28 DIAGNOSIS — M1611 Unilateral primary osteoarthritis, right hip: Secondary | ICD-10-CM | POA: Diagnosis not present

## 2015-11-28 DIAGNOSIS — M4806 Spinal stenosis, lumbar region: Secondary | ICD-10-CM | POA: Diagnosis not present

## 2015-11-28 DIAGNOSIS — L989 Disorder of the skin and subcutaneous tissue, unspecified: Secondary | ICD-10-CM | POA: Insufficient documentation

## 2015-11-28 DIAGNOSIS — M7061 Trochanteric bursitis, right hip: Secondary | ICD-10-CM | POA: Diagnosis not present

## 2015-11-28 MED ORDER — MIDAZOLAM HCL 5 MG/5ML IJ SOLN: 1.0000 mg | INTRAMUSCULAR | Status: DC | PRN

## 2015-11-28 MED ORDER — FENTANYL CITRATE (PF) 100 MCG/2ML IJ SOLN: 25.0000 ug | INTRAMUSCULAR | Status: DC | PRN

## 2015-11-28 MED ORDER — ROPIVACAINE HCL 2 MG/ML IJ SOLN: 9.0000 mL | Freq: Once | INTRAMUSCULAR | Status: DC

## 2015-11-28 MED ORDER — ROPIVACAINE HCL 2 MG/ML IJ SOLN
INTRAMUSCULAR | Status: AC
  Administered 2015-11-28: 10:00:00
  Filled 2015-11-28: qty 20

## 2015-11-28 MED ORDER — TRAMADOL HCL 50 MG PO TABS: 50.0000 mg | ORAL_TABLET | Freq: Three times a day (TID) | ORAL | Status: DC | PRN

## 2015-11-28 MED ORDER — LACTATED RINGERS IV SOLN: 1000.0000 mL | Freq: Once | INTRAVENOUS | Status: DC

## 2015-11-28 MED ORDER — TRIAMCINOLONE ACETONIDE 40 MG/ML IJ SUSP: 40.0000 mg | Freq: Once | INTRAMUSCULAR | Status: DC

## 2015-11-28 MED ORDER — LIDOCAINE HCL (PF) 1 % IJ SOLN: 10.0000 mL | Freq: Once | INTRAMUSCULAR | Status: DC

## 2015-11-28 MED ORDER — TRIAMCINOLONE ACETONIDE 40 MG/ML IJ SUSP
INTRAMUSCULAR | Status: AC
  Administered 2015-11-28: 10:00:00
  Filled 2015-11-28: qty 2

## 2015-11-28 NOTE — Patient Instructions (Addendum)
Radiofrequency Lesioning Radiofrequency lesioning is a procedure that is performed to relieve pain. The procedure is often used for back, neck, or arm pain. Radiofrequency lesioning involves the use of a machine that creates radio waves to make heat. During the procedure, the heat is applied to the nerve that carries the pain signal. The heat damages the nerve and interferes with the pain signal. Pain relief usually lasts for 6 months to 1 year. LET [facility] CARE PROVIDER KNOW ABOUT: 1. Any allergies you have. 2. All medicines you are taking, including vitamins, herbs, eye drops, creams, and over-the-counter medicines. 3. Previous problems you or members of your family have had with the use of anesthetics. 4. Any blood disorders you have. 5. Previous surgeries you have had. 6. Any medical conditions you have. 7. Whether you are pregnant or may be pregnant. RISKS AND COMPLICATIONS Generally, this is a safe procedure. However, problems may occur, including:  Pain or soreness at the injection site.  Infection at the injection site.  Damage to nerves or blood vessels. BEFORE THE PROCEDURE 1. Ask your health care provider about: 1. Changing or stopping your regular medicines. This is especially important if you are taking diabetes medicines or blood thinners. 2. Taking medicines such as aspirin and ibuprofen. These medicines can thin your blood. Do not take these medicines before your procedure if your health care provider instructs you not to. 2. Follow instructions from your health care provider about eating or drinking restrictions. 3. Plan to have someone take you home after the procedure. 4. If you go home right after the procedure, plan to have someone with you for 24 hours. PROCEDURE  You will be given one or more of the following:  A medicine to help you relax (sedative).  A medicine to numb the area (local anesthetic).  You will be awake during the procedure. You will need to  be able to talk with the health care provider during the procedure.  With the help of a type of X-ray (fluoroscopy), the health care provider will insert a radiofrequency needle into the area to be treated.  Next, a wire that carries the radio waves (electrode) will be put through the radiofrequency needle. An electrical pulse will be sent through the electrode to verify the correct nerve. You will feel a tingling sensation, and you may have muscle twitching.  Then, the tissue that is around the needle tip will be heated by an electric current that is passed using the radiofrequency machine. This will numb the nerves.  A bandage (dressing) will be put on the insertion area after the procedure is done. The procedure may vary among health care providers and hospitals. AFTER THE PROCEDURE 1. Your blood pressure, heart rate, breathing rate, and blood oxygen level will be monitored often until the medicines you were given have worn off. 2. Return to your normal activities as directed by your health care provider.   This information is not intended to replace advice given to you by your health care provider. Make sure you discuss any questions you have with your health care provider.   Document Released: 03/13/2011 Document Revised: 04/05/2015 Document Reviewed: 08/22/2014 Elsevier Interactive Patient Education 2016 [place] Facet Blocks Patient Information  Description: The facets are joints in the spine between the vertebrae.  Like any joints in the body, facets can become irritated and painful.  Arthritis can also effect the facets.  By injecting steroids and local anesthetic in and around these joints, we can  temporarily block the nerve supply to them.  Steroids act directly on irritated nerves and tissues to reduce selling and inflammation which often leads to decreased pain.  Facet blocks may be done anywhere along the spine from the neck to the low back depending upon the location of your  pain.   After numbing the skin with local anesthetic (like Novocaine), a small needle is passed onto the facet joints under x-ray guidance.  You may experience a sensation of pressure while this is being done.  The entire block usually lasts about 15-25 minutes.   Conditions which may be treated by facet blocks:   Low back/buttock pain  Neck/shoulder pain  Certain types of headaches  Preparation for the injection:  5. Do not eat any solid food or dairy products within 8 hours of your appointment. 6. You may drink clear liquid up to 3 hours before appointment.  Clear liquids include water, black coffee, juice or soda.  No milk or cream please. 7. You may take your regular medication, including pain medications, with a sip of water before your appointment.  Diabetics should hold regular insulin (if taken separately) and take 1/2 normal NPH dose the morning of the procedure.  Carry some sugar containing items with you to your appointment. 8. A driver must accompany you and be prepared to drive you home after your procedure. 9. Bring all your current medications with you. 10. An IV may be inserted and sedation may be given at the discretion of the physician. 11. A blood pressure cuff, EKG and other monitors will often be applied during the procedure.  Some patients may need to have extra oxygen administered for a short period. 12. You will be asked to provide medical information, including your allergies and medications, prior to the procedure.  We must know immediately if you are taking blood thinners (like Coumadin/Warfarin) or if you are allergic to IV iodine contrast (dye).  We must know if you could possible be pregnant.  Possible side-effects:   Bleeding from needle site  Infection (rare, may require surgery)  Nerve injury (rare)  Numbness & tingling (temporary)  Difficulty urinating (rare, temporary)  Spinal headache (a headache worse with upright posture)  Light-headedness  (temporary)  Pain at injection site (serveral days)  Decreased blood pressure (rare, temporary)  Weakness in arm/leg (temporary)  Pressure sensation in back/neck (temporary)   Call if you experience:   Fever/chills associated with headache or increased back/neck pain  Headache worsened by an upright position  New onset, weakness or numbness of an extremity below the injection site  Hives or difficulty breathing (go to the emergency room)  Inflammation or drainage at the injection site(s)  Severe back/neck pain greater than usual  New symptoms which are concerning to you  Please note:  Although the local anesthetic injected can often make your back or neck feel good for several hours after the injection, the pain will likely return. It takes 3-7 days for steroids to work.  You may not notice any pain relief for at least one week.  If effective, we will often do a series of 2-3 injections spaced 3-6 weeks apart to maximally decrease your pain.  After the initial series, you may be a candidate for a more permanent nerve block of the facets.  If you have any questions, please call #[phone]) [facility]  General Discharge Instructions :  If you need to reach your doctor call: Monday-Friday  8:00 am - 4:00 pm at [phone] or toll free [phone].  After clinic hours [phone] to have operator reach doctor.  Bring all of your medication bottles to all your appointments in the pain clinic.  To cancel or reschedule your appointment with Pain Management please remember to call 24 hours in advance to avoid a fee.  Refer to the educational materials which you have been given on: General Risks, I had my Procedure. Discharge Instructions, Post Sedation.  Post Procedure Instructions:  The drugs you were given will stay in your system until tomorrow, so for the next 24 hours you should not drive,  make any legal decisions or drink any alcoholic beverages.  You may eat anything you prefer, but it is better to start with liquids then soups and crackers, and gradually work up to solid foods.  Please notify your doctor immediately if you have any unusual bleeding, trouble breathing or pain that is not related to your normal pain.  Depending on the type of procedure that was done, some parts of your body may feel week and/or numb.  This usually clears up by tonight or the next day.  Walk with the use of an assistive device or accompanied by an adult for the 24 hours.  You may use ice on the affected area for the first 24 hours.  Put ice in a Ziploc bag and cover with a towel and place against area 15 minutes on 15 minutes off.  You may switch to heat after 24 hours.

## 2015-11-28 NOTE — Progress Notes (Signed)
Patient's Name: Brittney Simpson  Patient type: Established  MRN: [id number]  Service setting: Ambulatory outpatient  DOB: 03-May-1928  Location: [facility]  DOS: 11/28/2015  Primary Care Physician: Dicky Doe, MD  Note by: Kathlen Brunswick. Dossie Arbour, M.D, DABA, DABAPM, DABPM, DABIPP, FIPP  Referring Physician: Arlis Porta., MD  Specialty: Board-Certified Interventional Pain Management  Last Visit to Pain Management: 11/21/2015   Primary Reason(s) for Visit: Interventional Pain Management Treatment. CC: Back Pain  Primary Diagnosis: Facet syndrome, lumbar [M54.5]   Procedure:  Anesthesia, Analgesia, Anxiolysis:  Type: Diagnostic Medial Branch Facet Block Region: Lumbar Level: L2, L3, L4, L5, & S1 Medial Branch Level(s) Laterality: Bilateral  Indications: 1. Lumbar facet syndrome (Bilateral) (R>L)   2. Lumbar spondylosis, unspecified spinal osteoarthritis   3. Chronic low back pain (Bilateral) (R>L)   4. Chronic pain     Pre-procedure Pain Score: 9/10 Reported level of pain is compatible with clinical observations Post-procedure Pain Score: 0-No pain  Type: Local Anesthesia Local Anesthetic: Lidocaine 1% Route: Infiltration ([place]/IM) IV Access: Declined Sedation: Declined  Indication(s): Analgesia       Pre-Procedure Assessment:  Brittney Simpson is a 82 y.o. year old, female patient, seen today for interventional treatment. She has Chronic pain; Long term current use of opiate analgesic; Long term prescription opiate use; Opiate use; Encounter for therapeutic drug level monitoring; Lumbar facet syndrome (Bilateral) (R>L); Lumbar spondylosis; Chronic low back pain (Bilateral) (R>L); Spondylolisthesis of lumbosacral region (L2-3 and L5-S1); Lumbar spinal stenosis (9 mm at L3-4); Trochanteric bursitis (Right); Chronic hip pain (Right); Osteoarthritis of hip (Right); Chronic sacroiliac joint pain (Right); Chronic lumbar radicular pain (Right); Chronic lower  extremity pain (Right); Myofascial pain; Encounter for chronic pain management; HBP (high blood pressure); CKD (chronic kidney disease) stage 3, GFR 30-59 ml/min; Skin lesion of scalp; Chronic anxiety; and Insomnia on her problem list.. Her primarily concern today is the Back Pain   Pain Type: Chronic pain Pain Location: Back Pain Orientation: Lower Pain Descriptors / Indicators: Aching Pain Frequency: Constant  Date of Last Visit: 09/26/15    Verification of the correct person, correct site (including marking of site), and correct procedure were performed and confirmed by the patient.  Consent: Secured. Under the influence of no sedatives a written informed consent was obtained, after having provided information on the risks and possible complications. To fulfill our ethical and legal obligations, as recommended by the American Medical Association's Code of Ethics, we have provided information to the patient about our clinical impression; the nature and purpose of the treatment or procedure; the risks, benefits, and possible complications of the intervention; alternatives; the risk(s) and benefit(s) of the alternative treatment(s) or procedure(s); and the risk(s) and benefit(s) of doing nothing. The patient was provided information about the risks and possible complications associated with the procedure. These include, but are not limited to, failure to achieve desired goals, infection, bleeding, organ or nerve damage, allergic reactions, paralysis, and death. In the case of spinal procedures these may include, but are not limited to, failure to achieve desired goals, infection, bleeding, organ or nerve damage, allergic reactions, paralysis, and death. In addition, the patient was informed that Medicine is not an exact science; therefore, there is also the possibility of unforeseen risks and possible complications that may result in a catastrophic outcome. The patient indicated having understood very  clearly. We have given the patient no guarantees and we have made no promises. Enough time was given to the patient  to ask questions, all of which were answered to the patient's satisfaction.  Consent Attestation: I, the ordering provider, attest that I have discussed with the patient the benefits, risks, side-effects, alternatives, likelihood of achieving goals, and potential problems during recovery for the procedure that I have provided informed consent.  Pre-Procedure Preparation: Safety Precautions: Allergies reviewed. Appropriate site, procedure, and patient were confirmed by following the Joint Commission's Universal Protocol (UP.01.01.01), in the form of a "Time Out". The patient was asked to confirm marked site and procedure, before commencing. The patient was asked about blood thinners, or active infections, both of which were denied. Patient was assessed for positional comfort and all pressure points were checked before starting procedure. Allergies: She is allergic to sulfa antibiotics.. Infection Control Precautions: Sterile technique used. Standard Universal Precautions were taken as recommended by the Department of [facility] for Disease Control and Prevention (CDC). Standard pre-surgical skin prep was conducted. Respiratory hygiene and cough etiquette was practiced. Hand hygiene observed. Safe injection practices and needle disposal techniques followed. SDV (single dose vial) medications used. Medications properly checked for expiration dates and contaminants. Personal protective equipment (PPE) used: Sterile Radiation-resistant gloves. Monitoring:  As per clinic protocol. Filed Vitals:   11/28/15 1002 11/28/15 1007 11/28/15 1011 11/28/15 1014  BP: 178/63 173/72 174/75 171/68  Pulse: 70 59 60 64  Temp:      Resp: '12 16 14 12  ' Height:      Weight:      SpO2: 97% 95% 95% 95%  Calculated BMI: Body mass index is 22.12 kg/(m^2).  Description of Procedure Process:     Time-out: "Time-out" completed before starting procedure, as per protocol. Position: Prone Target Area: For Lumbar Facet blocks, the target is the groove formed by the junction of the transverse process and superior articular process. For the L5 dorsal ramus, the target is the notch between superior articular process and sacral ala. For the S1 dorsal ramus, the target is the superior and lateral edge of the posterior S1 Sacral foramen. Approach: Paramedial approach. Area Prepped: Entire Posterior Lumbosacral Region Prepping solution: ChloraPrep (2% chlorhexidine gluconate and 70% isopropyl alcohol) Safety Precautions: Aspiration looking for blood return was conducted prior to all injections. At no point did we inject any substances, as a needle was being advanced. No attempts were made at seeking any paresthesias. Safe injection practices and needle disposal techniques used. Medications properly checked for expiration dates. SDV (single dose vial) medications used. Latex Allergy precautions taken.   Description of the Procedure: Protocol guidelines were followed. The patient was placed in position over the fluoroscopy table. The target area was identified and the area prepped in the usual manner. Skin desensitized using vapocoolant spray. Skin & deeper tissues infiltrated with local anesthetic. Appropriate amount of time allowed to pass for local anesthetics to take effect. The procedure needle was introduced through the skin, ipsilateral to the reported pain, and advanced to the target area. Employing the "Medial Branch Technique", the needles were advanced to the angle made by the superior and medial portion of the transverse process, and the lateral and inferior portion of the superior articulating process of the targeted vertebral bodies. This area is known as "Burton's Eye" or the "Eye of the [place] Dog". A procedure needle was introduced through the skin, and this time advanced to the angle made by  the superior and medial border of the sacral ala, and the lateral border of the S1 vertebral body. This last needle was later repositioned at  the superior and lateral border of the posterior S1 foramen. Negative aspiration confirmed. Solution injected in intermittent fashion, asking for systemic symptoms every 0.5cc of injectate. The needles were then removed and the area cleansed, making sure to leave some of the prepping solution back to take advantage of its long term bactericidal properties. EBL: None Materials & Medications Used:  Needle(s) Used: 25g - 3.5" Spinal Needle(s)  Imaging Guidance:   Type of Imaging Technique: Fluoroscopy Guidance (Spinal) Indication(s): Assistance in needle guidance and placement for procedures requiring needle placement in or near specific anatomical locations not easily accessible without such assistance. Exposure Time: Please see nurses notes. Contrast: None required. Fluoroscopic Guidance: I was personally present in the fluoroscopy suite, where the patient was placed in position for the procedure, over the fluoroscopy-compatible table. Fluoroscopy was manipulated, using "Tunnel Vision Technique", to obtain the best possible view of the target area, on the affected side. Parallax error was corrected before commencing the procedure. A "direction-depth-direction" technique was used to introduce the needle under continuous pulsed fluoroscopic guidance. Once the target was reached, antero-posterior, oblique, and lateral fluoroscopic projection views were taken to confirm needle placement in all planes. Permanently recorded images stored by scanning into EMR. Interpretation: Intraoperative imaging interpretation by performing Physician. Adequate needle placement confirmed. Adequate needle placement confirmed in AP, lateral, & Oblique Views. No contrast injected.  Antibiotic Prophylaxis:  Indication(s): No indications identified. Type:  Antibiotics Given (last 72  hours)    None       Post-operative Assessment:   Complications: No immediate post-treatment complications were observed. Disposition: Return to clinic for follow-up evaluation. The patient tolerated the entire procedure well. A repeat set of vitals were taken after the procedure and the patient was kept under observation following institutional policy, for this procedure. Post-procedural neurological assessment was performed, showing return to baseline, prior to discharge. The patient was discharged home, once institutional criteria were met. The patient was provided with post-procedure discharge instructions, including a section on how to identify potential problems. Should any problems arise concerning this procedure, the patient was given instructions to immediately contact us, at any time, without hesitation. In any case, we plan to contact the patient by telephone for a follow-up status report regarding this interventional procedure. Comments:  No additional relevant information.  Medications administered during this visit: We administered ropivacaine (PF) 2 mg/ml (0.2%) and triamcinolone acetonide.  Prescriptions ordered during this visit: New Prescriptions   No medications on file    Requested PM Follow-up: Return in about 2 weeks (around 12/13/2015) for Post-Procedure Eval (2 weeks).  Future Appointments Date Time Provider [place]  12/18/2015 8:40 AM Milinda Pointer, MD [facility] None  04/23/2016 1:00 PM Arlis Porta., MD [facility] None    Primary Care Physician: Dicky Doe, MD Location: [facility] Note by: Kathlen Brunswick. Dossie Arbour, M.D, DABA, DABAPM, DABPM, DABIPP, FIPP   Illustration of the posterior view of the lumbar spine and the posterior neural structures. Laminae of L2 through S1 are labeled. DPRL5, dorsal primary ramus of L5; DPRS1, dorsal primary ramus of S1; DPR3, dorsal primary ramus of L3; FJ, facet (zygapophyseal)  joint L3-L4; I, inferior articular process of L4; LB1, lateral branch of dorsal primary ramus of L1; IAB, inferior articular branches from L3 medial branch (supplies L4-L5 facet joint); IBP, intermediate branch plexus; MB3, medial branch of dorsal primary ramus of L3; NR3, third lumbar nerve root; S, superior articular process of L5; SAB, superior articular branches from L4 (  supplies L4-5 facet joint also); TP3, transverse process of L3.  Disclaimer:  Medicine is not an Chief Strategy Officer. The only guarantee in medicine is that nothing is guaranteed. It is important to note that the decision to proceed with this intervention was based on the information collected from the patient. The Data and conclusions were drawn from the patient's questionnaire, the interview, and the physical examination. Because the information was provided in large part by the patient, it cannot be guaranteed that it has not been purposely or unconsciously manipulated. Every effort has been made to obtain as much relevant data as possible for this evaluation. It is important to note that the conclusions that lead to this procedure are derived in large part from the available data. Always take into account that the treatment will also be dependent on availability of resources and existing treatment guidelines, considered by other Pain Management Practitioners as being common knowledge and practice, at the time of the intervention. For Medico-Legal purposes, it is also important to point out that variation in procedural techniques and pharmacological choices are the acceptable norm. The indications, contraindications, technique, and results of the above procedure should only be interpreted and judged by a Board-Certified Interventional Pain Specialist with extensive familiarity and expertise in the same exact procedure and technique. Attempts at providing opinions without similar or greater experience and expertise than that of the treating  physician will be considered as inappropriate and unethical, and shall result in a formal complaint to the state medical board and applicable specialty societies.

## 2015-11-28 NOTE — Progress Notes (Signed)
Safety precautions to be maintained throughout the outpatient stay will include: orient to surroundings, keep bed in low position, maintain call bell within reach at all times, provide assistance with transfer out of bed and ambulation.  

## 2015-11-28 NOTE — Progress Notes (Signed)
Tramadol #14/90  Filled 11-07-15

## 2015-11-29 ENCOUNTER — Telehealth: Payer: Self-pay | Admitting: *Deleted

## 2015-11-29 NOTE — Telephone Encounter (Signed)
Spoke with patients daughter, Arbie Cookey, she states that her mother is doing well, no complications or concerns.

## 2015-12-15 DIAGNOSIS — L720 Epidermal cyst: Secondary | ICD-10-CM | POA: Diagnosis not present

## 2015-12-15 DIAGNOSIS — D485 Neoplasm of uncertain behavior of skin: Secondary | ICD-10-CM | POA: Diagnosis not present

## 2015-12-15 DIAGNOSIS — D3611 Benign neoplasm of peripheral nerves and autonomic nervous system of face, head, and neck: Secondary | ICD-10-CM | POA: Diagnosis not present

## 2015-12-18 ENCOUNTER — Ambulatory Visit: Payer: Medicare Other | Admitting: Pain Medicine

## 2015-12-22 DIAGNOSIS — H401133 Primary open-angle glaucoma, bilateral, severe stage: Secondary | ICD-10-CM | POA: Diagnosis not present

## 2015-12-27 ENCOUNTER — Ambulatory Visit: Payer: Medicare Other | Attending: Pain Medicine | Admitting: Pain Medicine

## 2015-12-27 ENCOUNTER — Encounter: Payer: Self-pay | Admitting: Pain Medicine

## 2015-12-27 ENCOUNTER — Ambulatory Visit: Payer: Medicare Other | Admitting: Pain Medicine

## 2015-12-27 VITALS — BP 153/51 | HR 52 | Temp 98.3°F | Resp 16 | Ht 66.0 in | Wt 135.0 lb

## 2015-12-27 DIAGNOSIS — L989 Disorder of the skin and subcutaneous tissue, unspecified: Secondary | ICD-10-CM | POA: Insufficient documentation

## 2015-12-27 DIAGNOSIS — G4700 Insomnia, unspecified: Secondary | ICD-10-CM | POA: Diagnosis not present

## 2015-12-27 DIAGNOSIS — M25551 Pain in right hip: Secondary | ICD-10-CM | POA: Insufficient documentation

## 2015-12-27 DIAGNOSIS — F1190 Opioid use, unspecified, uncomplicated: Secondary | ICD-10-CM

## 2015-12-27 DIAGNOSIS — M4316 Spondylolisthesis, lumbar region: Secondary | ICD-10-CM | POA: Diagnosis not present

## 2015-12-27 DIAGNOSIS — M5416 Radiculopathy, lumbar region: Secondary | ICD-10-CM | POA: Diagnosis not present

## 2015-12-27 DIAGNOSIS — E079 Disorder of thyroid, unspecified: Secondary | ICD-10-CM | POA: Diagnosis not present

## 2015-12-27 DIAGNOSIS — M7061 Trochanteric bursitis, right hip: Secondary | ICD-10-CM | POA: Insufficient documentation

## 2015-12-27 DIAGNOSIS — M4806 Spinal stenosis, lumbar region: Secondary | ICD-10-CM | POA: Diagnosis not present

## 2015-12-27 DIAGNOSIS — Z5181 Encounter for therapeutic drug level monitoring: Secondary | ICD-10-CM

## 2015-12-27 DIAGNOSIS — Z8719 Personal history of other diseases of the digestive system: Secondary | ICD-10-CM | POA: Diagnosis not present

## 2015-12-27 DIAGNOSIS — K8020 Calculus of gallbladder without cholecystitis without obstruction: Secondary | ICD-10-CM | POA: Diagnosis not present

## 2015-12-27 DIAGNOSIS — G8929 Other chronic pain: Secondary | ICD-10-CM | POA: Insufficient documentation

## 2015-12-27 DIAGNOSIS — M8580 Other specified disorders of bone density and structure, unspecified site: Secondary | ICD-10-CM | POA: Insufficient documentation

## 2015-12-27 DIAGNOSIS — E785 Hyperlipidemia, unspecified: Secondary | ICD-10-CM | POA: Insufficient documentation

## 2015-12-27 DIAGNOSIS — M545 Low back pain: Secondary | ICD-10-CM | POA: Diagnosis not present

## 2015-12-27 DIAGNOSIS — I129 Hypertensive chronic kidney disease with stage 1 through stage 4 chronic kidney disease, or unspecified chronic kidney disease: Secondary | ICD-10-CM | POA: Insufficient documentation

## 2015-12-27 DIAGNOSIS — M79604 Pain in right leg: Secondary | ICD-10-CM | POA: Insufficient documentation

## 2015-12-27 DIAGNOSIS — N183 Chronic kidney disease, stage 3 (moderate): Secondary | ICD-10-CM | POA: Diagnosis not present

## 2015-12-27 DIAGNOSIS — M48061 Spinal stenosis, lumbar region without neurogenic claudication: Secondary | ICD-10-CM

## 2015-12-27 DIAGNOSIS — F419 Anxiety disorder, unspecified: Secondary | ICD-10-CM | POA: Diagnosis not present

## 2015-12-27 DIAGNOSIS — Z9889 Other specified postprocedural states: Secondary | ICD-10-CM | POA: Insufficient documentation

## 2015-12-27 DIAGNOSIS — M47816 Spondylosis without myelopathy or radiculopathy, lumbar region: Secondary | ICD-10-CM

## 2015-12-27 DIAGNOSIS — M533 Sacrococcygeal disorders, not elsewhere classified: Secondary | ICD-10-CM | POA: Diagnosis not present

## 2015-12-27 DIAGNOSIS — K449 Diaphragmatic hernia without obstruction or gangrene: Secondary | ICD-10-CM | POA: Insufficient documentation

## 2015-12-27 DIAGNOSIS — I700 Atherosclerosis of aorta: Secondary | ICD-10-CM | POA: Diagnosis not present

## 2015-12-27 DIAGNOSIS — M1611 Unilateral primary osteoarthritis, right hip: Secondary | ICD-10-CM | POA: Insufficient documentation

## 2015-12-27 DIAGNOSIS — Z79891 Long term (current) use of opiate analgesic: Secondary | ICD-10-CM | POA: Diagnosis not present

## 2015-12-27 DIAGNOSIS — M549 Dorsalgia, unspecified: Secondary | ICD-10-CM | POA: Diagnosis present

## 2015-12-27 MED ORDER — TRAMADOL HCL 50 MG PO TABS: 50.0000 mg | ORAL_TABLET | Freq: Three times a day (TID) | ORAL | Status: DC | PRN

## 2015-12-27 NOTE — Progress Notes (Signed)
Safety precautions to be maintained throughout the outpatient stay will include: orient to surroundings, keep bed in low position, maintain call bell within reach at all times, provide assistance with transfer out of bed and ambulation.   #19 out of 90 Tramadol 50 mg remaining. Daughter states there are 9-12 remaining in pill box at home. Filled on 12-04-15.

## 2015-12-27 NOTE — Progress Notes (Signed)
Patient's Name: Brittney Simpson  Patient type: Established  MRN: [id number]  Service setting: Ambulatory outpatient  DOB: [birth year]  Location: [facility]  DOS: 12/27/2015  Primary Care Physician: Dicky Doe, MD  Note by: Kathlen Brunswick. Dossie Arbour, M.D, DABA, [place], DABPM, DABIPP, FIPP  Referring Physician: Arlis Porta., MD  Specialty: Board-Certified Interventional Pain Management  Last Visit to Pain Management: 11/29/2015   Primary Reason(s) for Visit: Encounter for prescription drug management & post-procedure evaluation of chronic illness with mild to moderate exacerbation(Level of risk: moderate) CC: Back Pain   HPI  Brittney Simpson is a 82 y.o. year old, female patient, who returns today as an established patient. She has Chronic pain; Long term current use of opiate analgesic; Long term prescription opiate use; Opiate use (15 MME/Day); Encounter for therapeutic drug level monitoring; Lumbar facet syndrome (Bilateral) (R>L); Lumbar spondylosis; Chronic low back pain (Bilateral) (R>L); Spondylolisthesis of lumbosacral region (L2-3 and L5-S1); Lumbar spinal stenosis (9 mm at L3-4); Trochanteric bursitis (Right); Chronic hip pain (Right); Osteoarthritis of hip (Right); Chronic sacroiliac joint pain (Right); Chronic lumbar radicular pain (Right); Chronic lower extremity pain (Right); Myofascial pain; Encounter for chronic pain management; HBP (high blood pressure); CKD (chronic kidney disease) stage 3, GFR 30-59 ml/min; Skin lesion of scalp; Chronic anxiety; and Insomnia on her problem list.. Her primarily concern today is the Back Pain   Pain Assessment: Self-Reported Pain Score: 0-No pain Reported level is compatible with observation Pain Type: Chronic pain Pain Location: Back Pain Orientation: Lower Pain Descriptors / Indicators:  (stiffness) Pain Frequency: Intermittent  The patient comes into the clinics today for post-procedure evaluation on the  interventional treatment done on 11/28/2015. In addition, she comes in today for pharmacological management of her chronic pain.  The patient  reports that she does not use illicit drugs.  Date of Last Visit: 11/28/15 Service Provided on Last Visit: Med Refill  Controlled Substance Pharmacotherapy Assessment & REMS (Risk Evaluation and Mitigation Strategy)  Analgesic: Tramadol 50 mg 1 tablet by mouth every 8 hours (150 mg/day of tramadol) Pill Count: #19 out of 90 Tramadol 50 mg remaining. Daughter states there are 9-12 remaining in pill box at home. Filled on 12-04-15 MME/day: 15 mg/day.  Pharmacokinetics: Onset of action (Liberation/Absorption): Within expected pharmacological parameters Time to Peak effect (Distribution): Timing and results are as within normal expected parameters Duration of action (Metabolism/Excretion): Within normal limits for medication Pharmacodynamics: Analgesic Effect: More than 50% Activity Facilitation: Medication(s) allow patient to sit, stand, walk, and do the basic ADLs Perceived Effectiveness: Described as relatively effective, allowing for increase in activities of daily living (ADL) Side-effects or Adverse reactions: None reported Monitoring: [place] PMP: Online review of the past 21-month period conducted. Compliant with practice rules and regulations Last UDS on record: TOXASSURE SELECT 13  Date Value Ref Range Status  05/24/2015 FINAL  Final    Comment:    ==================================================================== TOXASSURE SELECT 13 (MW) ==================================================================== Test                             Result       Flag       Units Drug Present   7-aminoclonazepam              270                     ng/mg creat    7-aminoclonazepam is an expected metabolite  of clonazepam. Source    of clonazepam is a scheduled prescription medication.   Tramadol                       PRESENT   O-Desmethyltramadol             PRESENT   N-Desmethyltramadol            PRESENT    Source of tramadol is a prescription medication.    O-desmethyltramadol and N-desmethyltramadol are expected    metabolites of tramadol. ==================================================================== Test                      Result    Flag   Units      Ref Range   Creatinine              47               mg/dL      >=20 ==================================================================== Declared Medications:  Medication list was not provided. ==================================================================== For clinical consultation, please call [phone]. ====================================================================    UDS interpretation: Compliant Medication Assessment Form: Reviewed. Patient indicates being compliant with therapy Treatment compliance: Compliant. The issue was informed about the CDC guidelines and recommendations not to combine opioids and benzodiazepines. Risk Assessment: Aberrant Behavior: None observed today Substance Use Disorder (SUD) Risk Level: Low Risk of opioid abuse or dependence: 0.7-3.0% with doses ? 36 MME/day and 6.1-26% with doses ? 120 MME/day. Opioid Risk Tool (ORT) Score:  0 Low Risk for SUD (Score <3) Depression Scale Score: PHQ-2: PHQ-2 Total Score: 0 No depression (0) PHQ-9: PHQ-9 Total Score: 0 No depression (0-4)  Pharmacologic Plan: No change in therapy, at this time  Previous Illicit Drug Screen Labs(s): No results found for: MDMA, COCAINSCRNUR, PCPSCRNUR, THCU  Post-Procedure Assessment  Procedure done on last visit: Diagnostic lumbar facet block #1 under fluoroscopic guidance and IV sedation. Side-effects or Adverse reactions: None reported Sedation: Sedation given  Results: Ultra-Short Term Relief (First 1 hour after procedure): 40 %  Analgesia during this period is likely to be Local Anesthetic and/or IV Sedative (Analgesic/Anxiolitic) related Short Term  Relief (Initial 4-6 hrs after procedure): 50 % Complete relief confirms area to be the source of pain Long Term Relief : 100 % Long-term benefit would suggest an inflammatory etiology to the pain   Current Relief (Now): 100%  Persistent relief would suggest effective anti-inflammatory effects from steroids Interpretation of Results: Based on the results, this will probably be a good alternative for the management of her low back pain.  Laboratory Chemistry  Inflammation Markers No results found for: ESRSEDRATE, CRP  Renal Function Lab Results  Component Value Date   BUN 13 11/24/2014   CREATININE 0.81 11/24/2014   GFRAA >60 11/24/2014   GFRNONAA >60 11/24/2014    Hepatic Function Lab Results  Component Value Date   AST 26 07/08/2014   ALT 25 07/08/2014   ALBUMIN 3.4 07/08/2014    Electrolytes Lab Results  Component Value Date   NA 131* 11/24/2014   K 4.2 11/24/2014   CL 97* 11/24/2014   CALCIUM 8.9 11/24/2014   MG 2.1 01/14/2014    Pain Modulating Vitamins No results found for: VD25OH, VD125OH2TOT, [id number], [id number], VITAMINB12  Coagulation Parameters Lab Results  Component Value Date   PLT 109* 11/24/2014    Note: Labs reviewed. and Results made available to patient  Recent Diagnostic Imaging  Dg Chest [facility]  11/24/2014  CLINICAL DATA:  One day history of chest pain and cardiac arrhythmia EXAM: PORTABLE CHEST - 1 VIEW COMPARISON:  April 22, 2012 FINDINGS: There is minimal scarring in the left base. The lungs are otherwise clear. The heart is upper normal in size with pulmonary vascularity within normal limits. No adenopathy. Patient is status post kyphoplasty for a mid thoracic vertebral body fracture. There is atherosclerotic change in the aorta. IMPRESSION: Minimal scarring left base. No edema or consolidation. No change in cardiac silhouette. Electronically Signed   By: Lowella Grip III M.D.   On: 11/24/2014 12:18   Lumbar Imaging: Lumbar  MR wo contrast:  Results for orders placed in visit on 09/28/10  MR L Spine Ltd W/O Cm   Narrative * PRIOR REPORT IMPORTED FROM AN EXTERNAL SYSTEM *   PRIOR REPORT IMPORTED FROM THE SYNGO WORKFLOW SYSTEM   REASON FOR EXAM:    Low Back Pain with Lumbar Radiculitis  COMMENTS:   PROCEDURE:     MR  - MR LUMBAR SPINE WO CONTRAST  - Sep 28 2010  1:37PM   RESULT:     Comparison: 05/17/2009   Technique: Standard lumbar spine protocol, without administration of IV  contrast.   Findings:  There is grade 1-2 anterolisthesis of L5 on S1 Thomas similar to prior.  There are chronic first defects bilaterally at L5. There is minimal  retrolisthesis of L3 and L4, similar to prior. Bone marrow endplate  reactive  changes are seen at L2-L3 and L5-S1.   T12-L1: No significant disc bulge or neuroforaminal narrowing.   L1-L2: No significant posterior disc bulge. No neuroforaminal narrowing.   L2-L3: Mild posterior disc bulge causes flattening of the thecal sac.  There  is mild degenerative facet disease. Mild bilateral neuroforaminal  narrowing.  The findings are similar to prior. There is slight impression on the  descending left L3 nerve roots.   L3-L4: Mild posterior disc bulge causes flattening of the thecal sac. This  in combination with mild degenerative facet disease cause mild canal  stenosis. No neuroforaminal narrowing. These findings are similar to  prior.   L4-L5: Mild posterior disc bulge causes flattening of the thecal sac. No  neuroforaminal narrowing.   L5-S1: There are mild proliferative change from the chronic appearing pars  defects. Pseudodisc secondary to the anterolisthesis and mild posterior  disc  bulge cause flattening of the ventral CSF space. There is moderate  bilateral  neuroforaminal narrowing. The findings are similar to prior.   Small T2 hyperintense structures in the kidneys likely represent cysts.  There is a moderate prominent right extra renal pelvis.    IMPRESSION:   1. Unchanged grade 1-2 anterolisthesis of L5 on S1, with chronic  spondylolysis at this level. There is moderate bilateral neuroforaminal  narrowing at L5-S1  2. Unchanged multilevel degenerative disc and facet disease, with mild  canal  stenosis at L3-L4.       Lumbar DG 2-3 views:  Results for orders placed in visit on 02/06/08  DG Lumbar Spine 2-3 Views   Narrative * PRIOR REPORT IMPORTED FROM AN EXTERNAL SYSTEM *   PRIOR REPORT IMPORTED FROM THE SY[facility] [place] EXAM:    low back pain with vertbrae guarding at L4  COMMENTS:   LMP: Post-Menopausal   PROCEDURE:     DXR - DXR LUMBAR SPINE AP AND LATERAL  - Feb 06 2008   9:58AM   RESULT:     History: 82 year old female low  back pain   Comparison: None   Findings:   AP and lateral views of the lumbar spine and a coned down view of the  lumbosacral junction are provided.   There are 5 nonrib bearing lumbar-type vertebral bodies. There is  generalized osteopenia. The vertebral body heights are maintained. There  is  minimal retrolisthesis of L2 on L3. There is grade I-II anterolisthesis of  L5 on S1. There is a bilateral pars interarticularis defect of L5. There  is  severe degenerative disc disease with disc height loss at L2-L3 with  discogenic end plate osteophytes.   The SI joints are unremarkable.   There is abdominal aortic atherosclerotic calcification. There is a  calcification in the right upper quadrant likely representing  cholelithiasis.   IMPRESSION:   1. Grade I-II anterolisthesis of L5 on S1 with pars interarticularis  defects  at L5.   2. Cholelithiasis.        Meds  The patient has a current medication list which includes the following prescription(s): amlodipine, brimonidine, clonazepam, vitamin d2, furosemide, latanoprost, levothyroxine, lisinopril, metoprolol succinate, omeprazole, sulfamethoxazole-trimethoprim, tramadol, and trazodone.  Current  Outpatient Prescriptions on File Prior to Visit  Medication Sig  . amLODipine (NORVASC) 5 MG tablet 5 mg daily.   . brimonidine (ALPHAGAN) 0.2 % ophthalmic solution   . clonazePAM (KLONOPIN) 0.5 MG tablet TAKE 1/2 TABLET BY MOUTH TWICE DAILY FOR ANXIETY.  . Ergocalciferol (VITAMIN D2) 2000 UNITS TABS Take 1 capsule by mouth.  . furosemide (LASIX) 20 MG tablet Take 20 mg by mouth daily.  Marland Kitchen latanoprost (XALATAN) 0.005 % ophthalmic solution   . levothyroxine (SYNTHROID, LEVOTHROID) 125 MCG tablet TAKE 1 TABLET BY MOUTH ONCE DAILY ON AN EMPTY STOMACH. WAIT 30 MINUTES BEFORE TAKING OTHER MEDS.  Marland Kitchen lisinopril (PRINIVIL,ZESTRIL) 30 MG tablet TAKE 1 TABLET BY MOUTH ONCE DAILY.  . metoprolol succinate (TOPROL-XL) 100 MG 24 hr tablet TAKE 1 TABLET BY MOUTH TWICE DAILY.  Marland Kitchen omeprazole (PRILOSEC) 20 MG capsule TAKE 1 CAPSULE BY MOUTH ONCE DAILY.  Marland Kitchen sulfamethoxazole-trimethoprim (BACTRIM,SEPTRA) 400-80 MG tablet   . traZODone (DESYREL) 50 MG tablet TAKE 1/2 TO 1 TABLET BY MOUTH AT BEDTIMEAS NEEDED FOR SLEEP   No current facility-administered medications on file prior to visit.    ROS  Constitutional: Denies any fever or chills Gastrointestinal: No reported hemesis, hematochezia, vomiting, or acute GI distress Musculoskeletal: Denies any acute onset joint swelling, redness, loss of ROM, or weakness Neurological: No reported episodes of acute onset apraxia, aphasia, dysarthria, agnosia, amnesia, paralysis, loss of coordination, or loss of consciousness  Allergies  Brittney Simpson has no active allergies.  [place]  Medical:  Brittney Simpson  has a past medical history of Back ache; Hyperlipidemia; Hypertension; Thyroid disease; Anxiety; Osteopenia; Hiatal hernia; and Hypothyroid. Family: family history includes Cancer in her father; Stroke in her mother. Surgical:  has past surgical history that includes Hernia repair; Hernia repair; and Hemorrhoid surgery. Tobacco:  reports that she has never smoked. She has  never used smokeless tobacco. Alcohol:  reports that she does not drink alcohol. Drug:  reports that she does not use illicit drugs.  Constitutional Exam  Vitals: Blood pressure 153/51, pulse 52, temperature 98.3 F (36.8 C), temperature source Oral, resp. rate 16, height 5\' 6"  (1.676 m), weight 135 lb (61.236 kg), SpO2 98 %. General appearance: Well nourished, well developed, and well hydrated. In no acute distress Calculated BMI/Body habitus: Body mass index is 21.8 kg/(m^2). (18.5-24.9 kg/m2) Ideal body weight Psych/Mental status: Alert and  oriented x 3 (person, place, & time) Eyes: PERLA Respiratory: No evidence of acute respiratory distress  Cervical Spine Exam  Inspection: No masses, redness, or swelling Alignment: Symmetrical ROM: Functional: ROM is within functional limits [facility]) Stability: No instability detected Muscle strength & Tone: Functionally intact Sensory: Unimpaired Palpation: No complaints of tenderness  Upper Extremity (UE) Exam    Side: Right upper extremity  Side: Left upper extremity  Inspection: No masses, redness, swelling, or asymmetry  Inspection: No masses, redness, swelling, or asymmetry  ROM:  ROM:  Functional: ROM is within functional limits [facility])  Functional: ROM is within functional limits [facility])  Muscle strength & Tone: Functionally intact  Muscle strength & Tone: Functionally intact  Sensory: Unimpaired  Sensory: Unimpaired  Palpation: Non-contributory  Palpation: Non-contributory   Thoracic Spine Exam  Inspection: No masses, redness, or swelling Alignment: Symmetrical ROM: Functional: ROM is within functional limits [facility]) Stability: No instability detected Sensory: Unimpaired Muscle strength & Tone: Functionally intact Palpation: No complaints of tenderness  Lumbar Spine Exam  Inspection: No masses, redness, or swelling Alignment: Symmetrical ROM: Functional: ROM is within functional limits [facility]) Stability: No instability  detected Muscle strength & Tone: Functionally intact Sensory: Unimpaired Palpation: No complaints of tenderness Provocative Tests: Lumbar Hyperextension and rotation test: deferred Patrick's Maneuver: deferred  Gait & Posture Assessment  Ambulation: Unassisted Gait: Unaffected Posture: WNL  Lower Extremity Exam    Side: Right lower extremity  Side: Left lower extremity  Inspection: No masses, redness, swelling, or asymmetry ROM:  Inspection: No masses, redness, swelling, or asymmetry ROM:  Functional: ROM is within functional limits [facility])  Functional: ROM is within functional limits [facility])  Muscle strength & Tone: Functionally intact  Muscle strength & Tone: Functionally intact  Sensory: Unimpaired  Sensory: Unimpaired  Palpation: Non-contributory  Palpation: Non-contributory   Assessment & Plan  Primary Diagnosis & Pertinent Problem List: The primary encounter diagnosis was Chronic pain. Diagnoses of Opiate use, Chronic hip pain (Right), Chronic low back pain (Bilateral) (R>L), Chronic lower extremity pain (Right), Chronic lumbar radicular pain (Right), Chronic sacroiliac joint pain (Right), Lumbar facet syndrome (Bilateral) (R>L), Lumbar spinal stenosis (9 mm at L3-4), Primary osteoarthritis of right hip, Trochanteric bursitis (Right), Encounter for therapeutic drug level monitoring, and Long term current use of opiate analgesic were also pertinent to this visit.  Visit Diagnosis: 1. Chronic pain   2. Opiate use   3. Chronic hip pain (Right)   4. Chronic low back pain (Bilateral) (R>L)   5. Chronic lower extremity pain (Right)   6. Chronic lumbar radicular pain (Right)   7. Chronic sacroiliac joint pain (Right)   8. Lumbar facet syndrome (Bilateral) (R>L)   9. Lumbar spinal stenosis (9 mm at L3-4)   10. Primary osteoarthritis of right hip   11. Trochanteric bursitis (Right)   12. Encounter for therapeutic drug level monitoring   13. Long term current use of opiate analgesic      Problems updated and reviewed during this visit: Problem  Opiate use (15 MME/Day)   Analgesic: Tramadol 50 mg 1 tablet by mouth every 8 hours (150 mg/day of tramadol)     Problem-specific Plan(s): No problem-specific assessment & plan notes found for this encounter.  No new assessment & plan notes have been filed under this hospital service since the last note was generated. Service: Pain Management   Plan of Care   Problem List Items Addressed This Visit      High   Chronic hip pain (Right) (Chronic)  Chronic low back pain (Bilateral) (R>L) (Chronic)   Relevant Medications   traMADol (ULTRAM) 50 MG tablet   Chronic lower extremity pain (Right) (Chronic)   Relevant Medications   traMADol (ULTRAM) 50 MG tablet   Other Relevant Orders   LUMBAR FACET(MEDIAL BRANCH NERVE BLOCK) MBNB   Chronic lumbar radicular pain (Right) (Chronic)   Relevant Orders   LUMBAR EPIDURAL STEROID INJECTION   Chronic pain - Primary (Chronic)   Relevant Medications   traMADol (ULTRAM) 50 MG tablet   Chronic sacroiliac joint pain (Right) (Chronic)   Relevant Medications   traMADol (ULTRAM) 50 MG tablet   Other Relevant Orders   SACROILIAC JOINT INJECTINS   Lumbar facet syndrome (Bilateral) (R>L) (Chronic)   Relevant Medications   traMADol (ULTRAM) 50 MG tablet   Other Relevant Orders   LUMBAR FACET(MEDIAL BRANCH NERVE BLOCK) MBNB   Lumbar spinal stenosis (9 mm at L3-4) (Chronic)   Relevant Orders   Lumbar Transforaminal epidural without steroid   Osteoarthritis of hip (Right) (Chronic)   Relevant Medications   traMADol (ULTRAM) 50 MG tablet   Other Relevant Orders   HIP INJECTION   Trochanteric bursitis (Right) (Chronic)   Relevant Orders   HIP INJECTION     Medium   Encounter for therapeutic drug level monitoring   Long term current use of opiate analgesic (Chronic)   Opiate use (15 MME/Day) (Chronic)       Pharmacotherapy (Medications Ordered): Meds ordered this encounter   Medications  . traMADol (ULTRAM) 50 MG tablet    Sig: Take 1 tablet (50 mg total) by mouth 3 (three) times daily as needed for severe pain.    Dispense:  90 tablet    Refill:  5    Do not place this medication, or any other prescription from our practice, on "Automatic Refill". Patient may have prescription filled one day early if pharmacy is closed on scheduled refill date.    Lab-work & Procedure Ordered: Orders Placed This Encounter  Procedures  . LUMBAR EPIDURAL STEROID INJECTION  . LUMBAR FACET(MEDIAL BRANCH NERVE BLOCK) MBNB  . Lumbar Transforaminal epidural without steroid  . SACROILIAC JOINT INJECTINS  . HIP INJECTION    Imaging Ordered: None  Interventional Therapies: Scheduled:  None at this time.    Considering:   1. Diagnostic bilateral lumbar facet block #3 under fluoroscopic guidance and IV sedation.  2. Possible bilateral lumbar facet radiofrequency ablation.  3. Diagnostic right-sided intra-articular hip joint injection under fluoroscopic guidance, no sedation.  4. Palliative right-sided L34 lumbar epidural steroid injection under fluoroscopic guidance, no sedation.  5. Diagnostic right-sided transforaminal L3-4 epidural steroid injection under fluoroscopic guidance, no sedation.  6. Right-sided trochanteric bursa injection under fluoroscopic guidance, no sedation.  7. Diagnostic right-sided sacroiliac joint block under fluoroscopic guidance, no sedation.    PRN Procedures:   1. Diagnostic bilateral lumbar facet block #3 under fluoroscopic guidance and IV sedation.  2. Diagnostic right-sided intra-articular hip joint injection under fluoroscopic guidance, no sedation.  3. Palliative right-sided L34 lumbar epidural steroid injection under fluoroscopic guidance, no sedation.  4. Diagnostic right-sided transforaminal L3-4 epidural steroid injection under fluoroscopic guidance, no sedation.  5. Right-sided trochanteric bursa injection under fluoroscopic guidance,  no sedation.  6. Diagnostic right-sided sacroiliac joint block under fluoroscopic guidance, no sedation.    Referral(s) or Consult(s): None at this time.  New Prescriptions   No medications on file    Medications administered during this visit: Brittney Simpson had no medications administered during this visit.  Requested PM Follow-up: Return in about 5 months (around 06/10/2016) for Procedure (PRN - Patient will call), Medication Management.  Future Appointments Date Time Provider [place]  04/23/2016 1:00 PM Arlis Porta., MD [facility] None  06/12/2016 9:20 AM Milinda Pointer, MD [facility] None    Primary Care Physician: Dicky Doe, MD Location: [facility] Note by: Kathlen Brunswick. Dossie Arbour, M.D, DABA, DABAPM, DABPM, DABIPP, FIPP  Pain Score Disclaimer: We use the NRS-11 scale. This is a self-reported, subjective measurement of pain severity with only modest accuracy. It is used primarily to identify changes within a particular patient. It must be understood that outpatient pain scales are significantly less accurate that those used for research, where they can be applied under ideal controlled circumstances with minimal exposure to variables. In reality, the score is likely to be a combination of pain intensity and pain affect, where pain affect describes the degree of emotional arousal or changes in action readiness caused by the sensory experience of pain. Factors such as social and work situation, setting, emotional state, anxiety levels, expectation, and prior pain experience may influence pain perception and show large inter-individual differences that may also be affected by time variables.  Patient instructions provided at this appointment:: There are no Patient Instructions on file for this visit.

## 2016-01-05 DIAGNOSIS — H401133 Primary open-angle glaucoma, bilateral, severe stage: Secondary | ICD-10-CM | POA: Diagnosis not present

## 2016-02-16 DIAGNOSIS — N390 Urinary tract infection, site not specified: Secondary | ICD-10-CM | POA: Diagnosis not present

## 2016-02-16 DIAGNOSIS — E875 Hyperkalemia: Secondary | ICD-10-CM | POA: Diagnosis not present

## 2016-02-16 DIAGNOSIS — I10 Essential (primary) hypertension: Secondary | ICD-10-CM | POA: Diagnosis not present

## 2016-02-16 DIAGNOSIS — N183 Chronic kidney disease, stage 3 (moderate): Secondary | ICD-10-CM | POA: Diagnosis not present

## 2016-02-16 DIAGNOSIS — E871 Hypo-osmolality and hyponatremia: Secondary | ICD-10-CM | POA: Diagnosis not present

## 2016-03-09 DIAGNOSIS — N390 Urinary tract infection, site not specified: Secondary | ICD-10-CM | POA: Diagnosis not present

## 2016-03-09 DIAGNOSIS — I10 Essential (primary) hypertension: Secondary | ICD-10-CM | POA: Diagnosis not present

## 2016-03-12 ENCOUNTER — Other Ambulatory Visit: Payer: Self-pay | Admitting: Family Medicine

## 2016-03-12 DIAGNOSIS — F419 Anxiety disorder, unspecified: Secondary | ICD-10-CM

## 2016-03-12 MED ORDER — CLONAZEPAM 0.5 MG PO TABS: ORAL_TABLET | ORAL | 1 refills | Status: DC

## 2016-04-08 ENCOUNTER — Other Ambulatory Visit: Payer: Self-pay | Admitting: Family Medicine

## 2016-04-08 DIAGNOSIS — G4700 Insomnia, unspecified: Secondary | ICD-10-CM

## 2016-04-23 ENCOUNTER — Encounter: Payer: Self-pay | Admitting: Family Medicine

## 2016-04-23 ENCOUNTER — Ambulatory Visit (INDEPENDENT_AMBULATORY_CARE_PROVIDER_SITE_OTHER): Payer: Medicare Other | Admitting: Family Medicine

## 2016-04-23 VITALS — BP 170/60 | HR 54 | Temp 97.2°F | Resp 16 | Ht 66.0 in | Wt 135.0 lb

## 2016-04-23 DIAGNOSIS — E038 Other specified hypothyroidism: Secondary | ICD-10-CM | POA: Diagnosis not present

## 2016-04-23 DIAGNOSIS — Z23 Encounter for immunization: Secondary | ICD-10-CM | POA: Diagnosis not present

## 2016-04-23 DIAGNOSIS — I10 Essential (primary) hypertension: Secondary | ICD-10-CM | POA: Diagnosis not present

## 2016-04-23 DIAGNOSIS — N183 Chronic kidney disease, stage 3 unspecified: Secondary | ICD-10-CM

## 2016-04-23 DIAGNOSIS — E039 Hypothyroidism, unspecified: Secondary | ICD-10-CM | POA: Insufficient documentation

## 2016-04-23 DIAGNOSIS — G8929 Other chronic pain: Secondary | ICD-10-CM

## 2016-04-23 DIAGNOSIS — F419 Anxiety disorder, unspecified: Secondary | ICD-10-CM | POA: Diagnosis not present

## 2016-04-23 DIAGNOSIS — E034 Atrophy of thyroid (acquired): Secondary | ICD-10-CM

## 2016-04-23 DIAGNOSIS — G4700 Insomnia, unspecified: Secondary | ICD-10-CM

## 2016-04-23 DIAGNOSIS — M545 Low back pain: Secondary | ICD-10-CM

## 2016-04-23 MED ORDER — CLONAZEPAM 0.5 MG PO TABS: ORAL_TABLET | ORAL | 5 refills | Status: DC

## 2016-04-23 MED ORDER — FUROSEMIDE 20 MG PO TABS: 20.0000 mg | ORAL_TABLET | Freq: Every day | ORAL | 12 refills | Status: DC

## 2016-04-23 MED ORDER — AMLODIPINE BESYLATE 5 MG PO TABS: 5.0000 mg | ORAL_TABLET | Freq: Every day | ORAL | 12 refills | Status: DC

## 2016-04-23 MED ORDER — LEVOTHYROXINE SODIUM 125 MCG PO TABS: ORAL_TABLET | ORAL | 12 refills | Status: DC

## 2016-04-23 MED ORDER — TRAZODONE HCL 50 MG PO TABS: ORAL_TABLET | ORAL | 5 refills | Status: DC

## 2016-04-23 MED ORDER — LISINOPRIL 40 MG PO TABS: 40.0000 mg | ORAL_TABLET | Freq: Every day | ORAL | 12 refills | Status: DC

## 2016-04-23 NOTE — Progress Notes (Signed)
Name: Brittney Simpson   MRN: [id number]    DOB: Mar 23, 1928   Date:04/23/2016       Progress Note  Subjective  Chief Complaint  Chief Complaint  Patient presents with  . Hypertension    HPI Here for f/u of HBP.  She is doing pretty well overall.  Nephrologist pleased with her status at this time.   Still sleeping ok.  No problem-specific Assessment & Plan notes found for this encounter.   Past Medical History:  Diagnosis Date  . Anxiety   . Back ache   . Hiatal hernia   . Hyperlipidemia   . Hypertension   . Hypothyroid   . Osteopenia   . Thyroid disease     Past Surgical History:  Procedure Laterality Date  . HEMORRHOID SURGERY    . HERNIA REPAIR    . HERNIA REPAIR      Family History  Problem Relation Age of Onset  . Stroke Mother   . Cancer Father     Social History   Social History  . Marital status: Widowed    Spouse name: N/A  . Number of children: N/A  . Years of education: N/A   Occupational History  . Not on file.   Social History Main Topics  . Smoking status: Never Smoker  . Smokeless tobacco: Never Used  . Alcohol use No  . Drug use: No  . Sexual activity: Yes    Birth control/ protection: Injection   Other Topics Concern  . Not on file   Social History Narrative  . No narrative on file     Current Outpatient Prescriptions:  .  amLODipine (NORVASC) 5 MG tablet, Take 1 tablet (5 mg total) by mouth daily., Disp: 30 tablet, Rfl: 12 .  brimonidine (ALPHAGAN) 0.2 % ophthalmic solution, , Disp: , Rfl:  .  clonazePAM (KLONOPIN) 0.5 MG tablet, TAKE 1/2 TABLET BY MOUTH TWICE DAILY FOR ANXIETY., Disp: 30 tablet, Rfl: 5 .  Ergocalciferol (VITAMIN D2) 2000 UNITS TABS, Take 1 capsule by mouth., Disp: , Rfl:  .  furosemide (LASIX) 20 MG tablet, Take 1 tablet (20 mg total) by mouth daily., Disp: 30 tablet, Rfl: 12 .  latanoprost (XALATAN) 0.005 % ophthalmic solution, , Disp: , Rfl:  .  levothyroxine (SYNTHROID, LEVOTHROID) 125 MCG tablet, TAKE  1 TABLET BY MOUTH ONCE DAILY ON AN EMPTY STOMACH. WAIT 30 MINUTES BEFORE TAKING OTHER MEDS., Disp: 30 tablet, Rfl: 12 .  lisinopril (PRINIVIL,ZESTRIL) 40 MG tablet, Take 1 tablet (40 mg total) by mouth daily., Disp: 30 tablet, Rfl: 12 .  metoprolol succinate (TOPROL-XL) 100 MG 24 hr tablet, TAKE 1 TABLET BY MOUTH TWICE DAILY., Disp: 60 tablet, Rfl: 12 .  omeprazole (PRILOSEC) 20 MG capsule, TAKE 1 CAPSULE BY MOUTH ONCE DAILY., Disp: 30 capsule, Rfl: 12 .  traMADol (ULTRAM) 50 MG tablet, Take 1 tablet (50 mg total) by mouth 3 (three) times daily as needed for severe pain., Disp: 90 tablet, Rfl: 5 .  traZODone (DESYREL) 50 MG tablet, TAKE 1/2 TO 1 TABLET BY MOUTH AT BEDTIMEAS NEEDED FOR SLEEP, Disp: 30 tablet, Rfl: 5  No Active Allergies   Review of Systems  Constitutional: Negative for chills, fever, malaise/fatigue and weight loss.  HENT: Negative for hearing loss.   Eyes: Negative for blurred vision and double vision.  Respiratory: Negative for cough, shortness of breath and wheezing.   Cardiovascular: Negative for chest pain, palpitations and leg swelling.  Gastrointestinal: Negative for abdominal pain, blood in stool  and heartburn.  Genitourinary: Negative for dysuria, frequency and urgency.  Musculoskeletal: Positive for neck pain. Negative for myalgias.  Skin: Negative for rash.  Neurological: Negative for dizziness, tremors, weakness and headaches.  Psychiatric/Behavioral: The patient is not nervous/anxious.       Objective  Vitals:   04/23/16 1328 04/23/16 1356  BP: (!) 165/65 (!) 170/60  Pulse: (!) 54   Resp: 16   Temp: 97.2 F (36.2 C)   TempSrc: Oral   Weight: 135 lb (61.2 kg)   Height: 5\' 6"  (1.676 m)     Physical Exam  Constitutional: She is oriented to person, place, and time and well-developed, well-nourished, and in no distress. No distress.  HENT:  Head: Normocephalic and atraumatic.  Eyes: Conjunctivae and EOM are normal. Pupils are equal, round, and  reactive to light. No scleral icterus.  Neck: Normal range of motion. Neck supple. Carotid bruit is not present. No thyromegaly present.  Cardiovascular: Normal rate, regular rhythm and normal heart sounds.  Exam reveals no gallop and no friction rub.   No murmur heard. Pulmonary/Chest: Effort normal and breath sounds normal. No respiratory distress. She has no wheezes. She has no rales.  Musculoskeletal: She exhibits no edema.  Lymphadenopathy:    She has no cervical adenopathy.  Neurological: She is alert and oriented to person, place, and time.  Vitals reviewed.      No results found for this or any previous visit (from the past 2160 hour(s)).   Assessment & Plan  Problem List Items Addressed This Visit      Cardiovascular and Mediastinum   HBP (high blood pressure) - Primary   Relevant Medications   lisinopril (PRINIVIL,ZESTRIL) 40 MG tablet   furosemide (LASIX) 20 MG tablet   amLODipine (NORVASC) 5 MG tablet     Endocrine   Hypothyroidism   Relevant Medications   levothyroxine (SYNTHROID, LEVOTHROID) 125 MCG tablet     Genitourinary   CKD (chronic kidney disease) stage 3, GFR 30-59 ml/min     Other   Chronic low back pain (Bilateral) (R>L) (Chronic)   Chronic anxiety   Relevant Medications   clonazePAM (KLONOPIN) 0.5 MG tablet   traZODone (DESYREL) 50 MG tablet   Insomnia   Relevant Medications   traZODone (DESYREL) 50 MG tablet    Other Visit Diagnoses    Immunization due       Relevant Orders   Flu vaccine HIGH DOSE PF (Fluzone High dose)      Meds ordered this encounter  Medications  . lisinopril (PRINIVIL,ZESTRIL) 40 MG tablet    Sig: Take 1 tablet (40 mg total) by mouth daily.    Dispense:  30 tablet    Refill:  12  . furosemide (LASIX) 20 MG tablet    Sig: Take 1 tablet (20 mg total) by mouth daily.    Dispense:  30 tablet    Refill:  12  . amLODipine (NORVASC) 5 MG tablet    Sig: Take 1 tablet (5 mg total) by mouth daily.    Dispense:  30  tablet    Refill:  12  . levothyroxine (SYNTHROID, LEVOTHROID) 125 MCG tablet    Sig: TAKE 1 TABLET BY MOUTH ONCE DAILY ON AN EMPTY STOMACH. WAIT 30 MINUTES BEFORE TAKING OTHER MEDS.    Dispense:  30 tablet    Refill:  12  . clonazePAM (KLONOPIN) 0.5 MG tablet    Sig: TAKE 1/2 TABLET BY MOUTH TWICE DAILY FOR ANXIETY.    Dispense:  30 tablet    Refill:  5  . traZODone (DESYREL) 50 MG tablet    Sig: TAKE 1/2 TO 1 TABLET BY MOUTH AT BEDTIMEAS NEEDED FOR SLEEP    Dispense:  30 tablet    Refill:  5   1. Essential hypertension  - lisinopril (PRINIVIL,ZESTRIL) 40 MG tablet; Take 1 tablet (40 mg total) by mouth daily.  Dispense: 30 tablet; Refill: 12 - furosemide (LASIX) 20 MG tablet; Take 1 tablet (20 mg total) by mouth daily.  Dispense: 30 tablet; Refill: 12 - amLODipine (NORVASC) 5 MG tablet; Take 1 tablet (5 mg total) by mouth daily.  Dispense: 30 tablet; Refill: 12  2. CKD (chronic kidney disease) stage 3, GFR 30-59 ml/min Cont to see Nephrology  3. Chronic low back pain (Bilateral) (R>L)   4. Chronic anxiety  - clonazePAM (KLONOPIN) 0.5 MG tablet; TAKE 1/2 TABLET BY MOUTH TWICE DAILY FOR ANXIETY.  Dispense: 30 tablet; Refill: 5  5. Insomnia  - traZODone (DESYREL) 50 MG tablet; TAKE 1/2 TO 1 TABLET BY MOUTH AT BEDTIMEAS NEEDED FOR SLEEP  Dispense: 30 tablet; Refill: 5  6. Hypothyroidism due to acquired atrophy of thyroid  - levothyroxine (SYNTHROID, LEVOTHROID) 125 MCG tablet; TAKE 1 TABLET BY MOUTH ONCE DAILY ON AN EMPTY STOMACH. WAIT 30 MINUTES BEFORE TAKING OTHER MEDS.  Dispense: 30 tablet; Refill: 12  7. Immunization due  - Flu vaccine HIGH DOSE PF (Fluzone High dose)

## 2016-05-07 ENCOUNTER — Ambulatory Visit: Payer: Medicare Other | Admitting: Pain Medicine

## 2016-05-14 ENCOUNTER — Encounter: Payer: Self-pay | Admitting: Pain Medicine

## 2016-05-14 ENCOUNTER — Ambulatory Visit
Admission: RE | Admit: 2016-05-14 | Discharge: 2016-05-14 | Disposition: A | Discharge status: Home / Self Care | Payer: Medicare Other | Source: Ambulatory Visit | Attending: Pain Medicine | Admitting: Pain Medicine

## 2016-05-14 ENCOUNTER — Ambulatory Visit (HOSPITAL_BASED_OUTPATIENT_CLINIC_OR_DEPARTMENT_OTHER): Payer: Medicare Other | Admitting: Pain Medicine

## 2016-05-14 VITALS — BP 195/73 | HR 57 | Temp 98.4°F | Resp 12 | Ht 66.0 in | Wt 134.0 lb

## 2016-05-14 DIAGNOSIS — G8929 Other chronic pain: Secondary | ICD-10-CM | POA: Insufficient documentation

## 2016-05-14 DIAGNOSIS — M25551 Pain in right hip: Secondary | ICD-10-CM | POA: Diagnosis not present

## 2016-05-14 DIAGNOSIS — M47816 Spondylosis without myelopathy or radiculopathy, lumbar region: Secondary | ICD-10-CM | POA: Insufficient documentation

## 2016-05-14 DIAGNOSIS — M1288 Other specific arthropathies, not elsewhere classified, other specified site: Secondary | ICD-10-CM

## 2016-05-14 DIAGNOSIS — M545 Low back pain: Secondary | ICD-10-CM

## 2016-05-14 DIAGNOSIS — M47817 Spondylosis without myelopathy or radiculopathy, lumbosacral region: Secondary | ICD-10-CM | POA: Insufficient documentation

## 2016-05-14 MED ORDER — TRIAMCINOLONE ACETONIDE 40 MG/ML IJ SUSP
40.0000 mg | Freq: Once | INTRAMUSCULAR | Status: AC
  Administered 2016-05-14: 40 mg
  Filled 2016-05-14: qty 1

## 2016-05-14 MED ORDER — ROPIVACAINE HCL 2 MG/ML IJ SOLN
9.0000 mL | Freq: Once | INTRAMUSCULAR | Status: AC
  Administered 2016-05-14: 9 mL
  Filled 2016-05-14: qty 10

## 2016-05-14 MED ORDER — LIDOCAINE HCL (PF) 1 % IJ SOLN
10.0000 mL | Freq: Once | INTRAMUSCULAR | Status: AC
  Administered 2016-05-14: 10 mL

## 2016-05-14 NOTE — Progress Notes (Signed)
Safety precautions to be maintained throughout the outpatient stay will include: orient to surroundings, keep bed in low position, maintain call bell within reach at all times, provide assistance with transfer out of bed and ambulation.  

## 2016-05-14 NOTE — Patient Instructions (Signed)
Pain Management Discharge Instructions  General Discharge Instructions :  If you need to reach your doctor call: Monday-Friday 8:00 am - 4:00 pm at [phone] or toll free [phone].  After clinic hours [phone] to have operator reach doctor.  Bring all of your medication bottles to all your appointments in the pain clinic.  To cancel or reschedule your appointment with Pain Management please remember to call 24 hours in advance to avoid a fee.  Refer to the educational materials which you have been given on: General Risks, I had my Procedure. Discharge Instructions, Post Sedation.  Post Procedure Instructions:  The drugs you were given will stay in your system until tomorrow, so for the next 24 hours you should not drive, make any legal decisions or drink any alcoholic beverages.  You may eat anything you prefer, but it is better to start with liquids then soups and crackers, and gradually work up to solid foods.  Please notify your doctor immediately if you have any unusual bleeding, trouble breathing or pain that is not related to your normal pain.  Depending on the type of procedure that was done, some parts of your body may feel week and/or numb.  This usually clears up by tonight or the next day.  Walk with the use of an assistive device or accompanied by an adult for the 24 hours.  You may use ice on the affected area for the first 24 hours.  Put ice in a Ziploc bag and cover with a towel and place against area 15 minutes on 15 minutes off.  You may switch to heat after 24 hours.Facet Joint Block The facet joints connect the bones of the spine (vertebrae). They make it possible for you to bend, twist, and make other movements with your spine. They also prevent you from overbending, overtwisting, and making other excessive movements.  A facet joint block is a procedure where a numbing medicine (anesthetic) is injected into a facet joint. Often, a type of anti-inflammatory  medicine called a steroid is also injected. A facet joint block may be done for two reasons:   Diagnosis. A facet joint block may be done as a test to see whether neck or back pain is caused by a worn-down or infected facet joint. If the pain gets better after a facet joint block, it means the pain is probably coming from the facet joint. If the pain does not get better, it means the pain is probably not coming from the facet joint.   Therapy. A facet joint block may be done to relieve neck or back pain caused by a facet joint. A facet joint block is only done as a therapy if the pain does not improve with medicine, exercise programs, physical therapy, and other forms of pain management. LET YOUR HEALTH CARE PROVIDER KNOW ABOUT:   Any allergies you have.   All medicines you are taking, including vitamins, herbs, eyedrops, and over-the-counter medicines and creams.   Previous problems you or members of your family have had with the use of anesthetics.   Any blood disorders you have had.   Other health problems you have. RISKS AND COMPLICATIONS Generally, having a facet joint block is safe. However, as with any procedure, complications can occur. Possible complications associated with having a facet joint block include:   Bleeding.   Injury to a nerve near the injection site.   Pain at the injection site.   Weakness or numbness in areas controlled by nerves near   the injection site.   Infection.   Temporary fluid retention.   Allergic reaction to anesthetics or medicines used during the procedure. BEFORE THE PROCEDURE   Follow your health care provider's instructions if you are taking dietary supplements or medicines. You may need to stop taking them or reduce your dosage.   Do not take any new dietary supplements or medicines without asking your health care provider first.   Follow your health care provider's instructions about eating and drinking before the  procedure. You may need to stop eating and drinking several hours before the procedure.   Arrange to have an adult drive you home after the procedure. PROCEDURE  You may need to remove your clothing and dress in an open-back gown so that your health care provider can access your spine.   The procedure will be done while you are lying on an X-ray table. Most of the time you will be asked to lie on your stomach, but you may be asked to lie in a different position if an injection will be made in your neck.   Special machines will be used to monitor your oxygen levels, heart rate, and blood pressure.   If an injection will be made in your neck, an intravenous (IV) tube will be inserted into one of your veins. Fluids and medicine will flow directly into your body through the IV tube.   The area over the facet joint where the injection will be made will be cleaned with an antiseptic soap. The surrounding skin will be covered with sterile drapes.   An anesthetic will be applied to your skin to make the injection area numb. You may feel a temporary stinging or burning sensation.   A video X-ray machine will be used to locate the joint. A contrast dye may be injected into the facet joint area to help with locating the joint.   When the joint is located, an anesthetic medicine will be injected into the joint through the needle.   Your health care provider will ask you whether you feel pain relief. If you do feel relief, a steroid may be injected to provide pain relief for a longer period of time. If you do not feel relief or feel only partial relief, additional injections of an anesthetic may be made in other facet joints.   The needle will be removed, the skin will be cleansed, and bandages will be applied.  AFTER THE PROCEDURE   You will be observed for 15-30 minutes before being allowed to go home. Do not drive. Have an adult drive you or take a taxi or public transportation instead.    If you feel pain relief, the pain will return in several hours or days when the anesthetic wears off.   You may feel pain relief 2-14 days after the procedure. The amount of time this relief lasts varies from person to person.   It is normal to feel some tenderness over the injected area(s) for 2 days following the procedure.   If you have diabetes, you may have a temporary increase in blood sugar.   This information is not intended to replace advice given to you by your health care provider. Make sure you discuss any questions you have with your health care provider.   Document Released: 12/04/2006 Document Revised: 08/05/2014 Document Reviewed: 05/04/2012 [employer] Interactive Patient Education 2016 [employer]  

## 2016-05-14 NOTE — Progress Notes (Signed)
Patient's Name: Brittney Simpson  MRN: [id number]  Referring Provider: Arlis Porta., MD  DOB: 24-Sep-1927  PCP: Dicky Doe, MD  DOS: 05/14/2016  Note by: Kathlen Brunswick. Dossie Arbour, MD  Service setting: Ambulatory outpatient  Location: [facility] (AMB) Pain Management Facility  Visit type: Procedure  Specialty: Interventional Pain Management  Patient type: Established   Primary Reason for Visit: Interventional Pain Management Treatment. CC: Back Pain (lower) and Hip Pain (right)  Procedure:  Anesthesia, Analgesia, Anxiolysis:  Type: Diagnostic Medial Branch Facet Block Region: Lumbar Level: L2, L3, L4, L5, & S1 Medial Branch Level(s) Laterality: Right  Type: Local Anesthesia Local Anesthetic: Lidocaine 1% Route: Infiltration ([place]/IM) IV Access: Declined Sedation: Declined  Indication(s): Analgesia          Indications: 1. Lumbar facet syndrome (Bilateral) (R>L)   2. Chronic low back pain (Bilateral) (R>L)   3. Lumbar spondylosis    Pain Score: Pre-procedure: 9 /10 Post-procedure: 0-No pain/10  Pre-Procedure Assessment:  Brittney Simpson is a 82 y.o. (year old), female patient, seen today for interventional treatment. She  has a past surgical history that includes Hernia repair; Hernia repair; and Hemorrhoid surgery.. Her primarily concern today is the Back Pain (lower) and Hip Pain (right) The primary encounter diagnosis was Lumbar facet syndrome (Bilateral) (R>L). Diagnoses of Chronic low back pain (Bilateral) (R>L) and Lumbar spondylosis were also pertinent to this visit.  Pain Type: Chronic pain Pain Location: Back Pain Orientation: Lower Pain Descriptors / Indicators: Aching, Constant, Dull Pain Frequency: Constant  Date of Last Visit: 12/27/15 Service Provided on Last Visit: Med Refill  Coagulation Parameters Lab Results  Component Value Date   PLT 109 (L) 11/24/2014   Verification of the correct person, correct site (including marking of site), and correct procedure  were performed and confirmed by the patient.  Consent: Before the procedure and under the influence of no sedative(s), amnesic(s), or anxiolytics, the patient was informed of the treatment options, risks and possible complications. To fulfill our ethical and legal obligations, as recommended by the American Medical Association's Code of Ethics, I have informed the patient of my clinical impression; the nature and purpose of the treatment or procedure; the risks, benefits, and possible complications of the intervention; the alternatives, including doing nothing; the risk(s) and benefit(s) of the alternative treatment(s) or procedure(s); and the risk(s) and benefit(s) of doing nothing. The patient was provided information about the general risks and possible complications associated with the procedure. These may include, but are not limited to: failure to achieve desired goals, infection, bleeding, organ or nerve damage, allergic reactions, paralysis, and death. In addition, the patient was informed of those risks and complications associated to Spine-related procedures, such as failure to decrease pain; infection (i.e.: Meningitis, epidural or intraspinal abscess); bleeding (i.e.: epidural hematoma, subarachnoid hemorrhage, or any other type of intraspinal or peri-dural bleeding); organ or nerve damage (i.e.: Any type of peripheral nerve, nerve root, or spinal cord injury) with subsequent damage to sensory, motor, and/or autonomic systems, resulting in permanent pain, numbness, and/or weakness of one or several areas of the body; allergic reactions; (i.e.: anaphylactic reaction); and/or death. Furthermore, the patient was informed of those risks and complications associated with the medications. These include, but are not limited to: allergic reactions (i.e.: anaphylactic or anaphylactoid reaction(s)); adrenal axis suppression; blood sugar elevation that in diabetics may result in ketoacidosis or comma; water  retention that in patients with history of congestive heart failure may result in shortness of breath, pulmonary edema,  and decompensation with resultant heart failure; weight gain; swelling or edema; medication-induced neural toxicity; particulate matter embolism and blood vessel occlusion with resultant organ, and/or nervous system infarction; and/or aseptic necrosis of one or more joints. Finally, the patient was informed that Medicine is not an exact science; therefore, there is also the possibility of unforeseen or unpredictable risks and/or possible complications that may result in a catastrophic outcome. The patient indicated having understood very clearly. We have given the patient no guarantees and we have made no promises. Enough time was given to the patient to ask questions, all of which were answered to the patient's satisfaction. Brittney Simpson has indicated that she wanted to continue with the procedure.  Consent Attestation: I, the ordering provider, attest that I have discussed with the patient the benefits, risks, side-effects, alternatives, likelihood of achieving goals, and potential problems during recovery for the procedure that I have provided informed consent.  Pre-Procedure Preparation:  Safety Precautions: Allergies reviewed. The patient was asked about blood thinners, or active infections, both of which were denied. The patient was asked to confirm the procedure and laterality, before marking the site, and again before commencing the procedure. Appropriate site, procedure, and patient were confirmed by following the Joint Commission's Universal Protocol (UP.01.01.01), in the form of a "Time Out". The patient was asked to participate by confirming the accuracy of the "Time Out" information. Patient was assessed for positional comfort and pressure points before starting the procedure. Allergies: She has no active allergies.. Allergy Precautions: None required Infection Control  Precautions: Sterile technique used. Standard Universal Precautions were taken as recommended by the Department of [facility] for Disease Control and Prevention (CDC). Standard pre-surgical skin prep was conducted. Respiratory hygiene and cough etiquette was practiced. Hand hygiene observed. Safe injection practices and needle disposal techniques followed. SDV (single dose vial) medications used. Medications properly checked for expiration dates and contaminants. Personal protective equipment (PPE) used as per protocol. Monitoring:  As per clinic protocol. Vitals:   05/14/16 0910 05/14/16 1025 05/14/16 1030 05/14/16 1034  BP: (!) 175/62 (!) 199/75 (!) 191/79 (!) 195/73  Pulse: (!) 53 (!) 57 (!) 58 (!) 57  Resp: 16 17 14 12   Temp: 98.4 F (36.9 C)     TempSrc: Oral     SpO2: 100% 98% 97% 99%  Weight: 134 lb (60.8 kg)     Height: 5\' 6"  (1.676 m)     Calculated BMI: Body mass index is 21.63 kg/m. Time-out: "Time-out" completed before starting procedure, as per protocol.  Description of Procedure Process:   Time-out: "Time-out" completed before starting procedure, as per protocol. Position: Prone Target Area: For Lumbar Facet blocks, the target is the groove formed by the junction of the transverse process and superior articular process. For the L5 dorsal ramus, the target is the notch between superior articular process and sacral ala. For the S1 dorsal ramus, the target is the superior and lateral edge of the posterior S1 Sacral foramen. Approach: Paramedial approach. Area Prepped: Entire Posterior Lumbosacral Region Prepping solution: ChloraPrep (2% chlorhexidine gluconate and 70% isopropyl alcohol) Safety Precautions: Aspiration looking for blood return was conducted prior to all injections. At no point did we inject any substances, as a needle was being advanced. No attempts were made at seeking any paresthesias. Safe injection practices and needle disposal techniques used.  Medications properly checked for expiration dates. SDV (single dose vial) medications used. Description of the Procedure: Protocol guidelines were followed. The patient was placed in position  over the fluoroscopy table. The target area was identified and the area prepped in the usual manner. Skin desensitized using vapocoolant spray. Skin & deeper tissues infiltrated with local anesthetic. Appropriate amount of time allowed to pass for local anesthetics to take effect. The procedure needle was introduced through the skin, ipsilateral to the reported pain, and advanced to the target area. Employing the "Medial Branch Technique", the needles were advanced to the angle made by the superior and medial portion of the transverse process, and the lateral and inferior portion of the superior articulating process of the targeted vertebral bodies. This area is known as "Burton's Eye" or the "Eye of the [place] Dog". A procedure needle was introduced through the skin, and this time advanced to the angle made by the superior and medial border of the sacral ala, and the lateral border of the S1 vertebral body. This last needle was later repositioned at the superior and lateral border of the posterior S1 foramen. Negative aspiration confirmed. Solution injected in intermittent fashion, asking for systemic symptoms every 0.5cc of injectate. The needles were then removed and the area cleansed, making sure to leave some of the prepping solution back to take advantage of its long term bactericidal properties. EBL: None Materials & Medications Used:  Needle(s) Used: 22g - 3.5" Spinal Needle(s)   Illustration of the posterior view of the lumbar spine and the posterior neural structures. Laminae of L2 through S1 are labeled. DPRL5, dorsal primary ramus of L5; DPRS1, dorsal primary ramus of S1; DPR3, dorsal primary ramus of L3; FJ, facet (zygapophyseal) joint L3-L4; I, inferior articular process of L4; LB1, lateral branch of  dorsal primary ramus of L1; IAB, inferior articular branches from L3 medial branch (supplies L4-L5 facet joint); IBP, intermediate branch plexus; MB3, medial branch of dorsal primary ramus of L3; NR3, third lumbar nerve root; S, superior articular process of L5; SAB, superior articular branches from L4 (supplies L4-5 facet joint also); TP3, transverse process of L3.  Imaging Guidance (Spinal):  Type of Imaging Technique: Fluoroscopy Guidance (Spinal) Indication(s): Assistance in needle guidance and placement for procedures requiring needle placement in or near specific anatomical locations not easily accessible without such assistance. Exposure Time: Please see nurses notes. Contrast: None used. Fluoroscopic Guidance: I was personally present during the use of fluoroscopy. "Tunnel Vision Technique" used to obtain the best possible view of the target area. Parallax error corrected before commencing the procedure. "Direction-depth-direction" technique used to introduce the needle under continuous pulsed fluoroscopy. Once target was reached, antero-posterior, oblique, and lateral fluoroscopic projection used confirm needle placement in all planes. Images permanently stored in EMR. Interpretation: No contrast injected. I personally interpreted the imaging intraoperatively. Adequate needle placement confirmed in multiple planes. Permanent images saved into the patient's record.  Antibiotic Prophylaxis:  Indication(s): No indications identified. Type:  Antibiotics Given (last 72 hours)    None      Post-operative Assessment:  Complications: No immediate post-treatment complications observed by team, or reported by patient. Disposition: The patient tolerated the entire procedure well. A repeat set of vitals were taken after the procedure and the patient was kept under observation following institutional policy, for this type of procedure. Post-procedural neurological assessment was performed, showing  return to baseline, prior to discharge. The patient was provided with post-procedure discharge instructions, including a section on how to identify potential problems. Should any problems arise concerning this procedure, the patient was given instructions to immediately contact us, at any time, without hesitation. In any case, we  plan to contact the patient by telephone for a follow-up status report regarding this interventional procedure. Comments:  No additional relevant information.  Plan of Care  Discharge to: Discharge home  Medications ordered for procedure: Meds ordered this encounter  Medications  . triamcinolone acetonide (KENALOG-40) injection 40 mg  . lidocaine (PF) (XYLOCAINE) 1 % injection 10 mL  . ropivacaine (PF) 2 mg/ml (0.2%) (NAROPIN) epidural 9 mL   Medications administered: (For more details, see medical record) We administered triamcinolone acetonide, lidocaine (PF), and ropivacaine (PF) 2 mg/ml (0.2%).  Imaging Ordered: No results found for this or any previous visit. New Prescriptions   No medications on file   Physician-requested Follow-up:  Return in about 2 weeks (around 05/28/2016) for Post-Procedure evaluation.  Future Appointments Date Time Provider [place]  06/04/2016 1:15 PM Milinda Pointer, [place] None   Primary Care Physician: Dicky Doe, MD Location: [facility] Out[facility] Management Facility Note by: Kathlen Brunswick. Dossie Arbour, M.D, DABA, DABAPM, DABPM, DABIPP, FIPP  Disclaimer:  Medicine is not an exact science. The only guarantee in medicine is that nothing is guaranteed. It is important to note that the decision to proceed with this intervention was based on the information collected from the patient. The Data and conclusions were drawn from the patient's questionnaire, the interview, and the physical examination. Because the information was provided in large part by the patient, it cannot be guaranteed that it has not been  purposely or unconsciously manipulated. Every effort has been made to obtain as much relevant data as possible for this evaluation. It is important to note that the conclusions that lead to this procedure are derived in large part from the available data. Always take into account that the treatment will also be dependent on availability of resources and existing treatment guidelines, considered by other Pain Management Practitioners as being common knowledge and practice, at the time of the intervention. For Medico-Legal purposes, it is also important to point out that variation in procedural techniques and pharmacological choices are the acceptable norm. The indications, contraindications, technique, and results of the above procedure should only be interpreted and judged by a Board-Certified Interventional Pain Specialist with extensive familiarity and expertise in the same exact procedure and technique. Attempts at providing opinions without similar or greater experience and expertise than that of the treating physician will be considered as inappropriate and unethical, and shall result in a formal complaint to the state medical board and applicable specialty societies.  Instructions provided at this appointment: Patient Instructions  Pain Management Discharge Instructions  General Discharge Instructions :  If you need to reach your doctor call: Monday-Friday 8:00 am - 4:00 pm at [phone] or toll free [phone].  After clinic hours [phone] to have operator reach doctor.  Bring all of your medication bottles to all your appointments in the pain clinic.  To cancel or reschedule your appointment with Pain Management please remember to call 24 hours in advance to avoid a fee.  Refer to the educational materials which you have been given on: General Risks, I had my Procedure. Discharge Instructions, Post Sedation.  Post Procedure Instructions:  The drugs you were given will stay in your  system until tomorrow, so for the next 24 hours you should not drive, make any legal decisions or drink any alcoholic beverages.  You may eat anything you prefer, but it is better to start with liquids then soups and crackers, and gradually work up to solid foods.  Please notify your doctor  immediately if you have any unusual bleeding, trouble breathing or pain that is not related to your normal pain.  Depending on the type of procedure that was done, some parts of your body may feel week and/or numb.  This usually clears up by tonight or the next day.  Walk with the use of an assistive device or accompanied by an adult for the 24 hours.  You may use ice on the affected area for the first 24 hours.  Put ice in a Ziploc bag and cover with a towel and place against area 15 minutes on 15 minutes off.  You may switch to heat after 24 hours.Facet Joint Block The facet joints connect the bones of the spine (vertebrae). They make it possible for you to bend, twist, and make other movements with your spine. They also prevent you from overbending, overtwisting, and making other excessive movements.  A facet joint block is a procedure where a numbing medicine (anesthetic) is injected into a facet joint. Often, a type of anti-inflammatory medicine called a steroid is also injected. A facet joint block may be done for two reasons:   Diagnosis. A facet joint block may be done as a test to see whether neck or back pain is caused by a worn-down or infected facet joint. If the pain gets better after a facet joint block, it means the pain is probably coming from the facet joint. If the pain does not get better, it means the pain is probably not coming from the facet joint.   Therapy. A facet joint block may be done to relieve neck or back pain caused by a facet joint. A facet joint block is only done as a therapy if the pain does not improve with medicine, exercise programs, physical therapy, and other forms of pain  management. LET [facility] CARE PROVIDER KNOW ABOUT:   Any allergies you have.   All medicines you are taking, including vitamins, herbs, eyedrops, and over-the-counter medicines and creams.   Previous problems you or members of your family have had with the use of anesthetics.   Any blood disorders you have had.   Other health problems you have. RISKS AND COMPLICATIONS Generally, having a facet joint block is safe. However, as with any procedure, complications can occur. Possible complications associated with having a facet joint block include:   Bleeding.   Injury to a nerve near the injection site.   Pain at the injection site.   Weakness or numbness in areas controlled by nerves near the injection site.   Infection.   Temporary fluid retention.   Allergic reaction to anesthetics or medicines used during the procedure. BEFORE THE PROCEDURE   Follow your health care provider's instructions if you are taking dietary supplements or medicines. You may need to stop taking them or reduce your dosage.   Do not take any new dietary supplements or medicines without asking your health care provider first.   Follow your health care provider's instructions about eating and drinking before the procedure. You may need to stop eating and drinking several hours before the procedure.   Arrange to have an adult drive you home after the procedure. PROCEDURE  You may need to remove your clothing and dress in an open-back gown so that your health care provider can access your spine.   The procedure will be done while you are lying on an X-ray table. Most of the time you will be asked to lie on your stomach, but  you may be asked to lie in a different position if an injection will be made in your neck.   Special machines will be used to monitor your oxygen levels, heart rate, and blood pressure.   If an injection will be made in your neck, an intravenous (IV) tube will be  inserted into one of your veins. Fluids and medicine will flow directly into your body through the IV tube.   The area over the facet joint where the injection will be made will be cleaned with an antiseptic soap. The surrounding skin will be covered with sterile drapes.   An anesthetic will be applied to your skin to make the injection area numb. You may feel a temporary stinging or burning sensation.   A video X-ray machine will be used to locate the joint. A contrast dye may be injected into the facet joint area to help with locating the joint.   When the joint is located, an anesthetic medicine will be injected into the joint through the needle.   Your health care provider will ask you whether you feel pain relief. If you do feel relief, a steroid may be injected to provide pain relief for a longer period of time. If you do not feel relief or feel only partial relief, additional injections of an anesthetic may be made in other facet joints.   The needle will be removed, the skin will be cleansed, and bandages will be applied.  AFTER THE PROCEDURE   You will be observed for 15-30 minutes before being allowed to go home. Do not drive. Have an adult drive you or take a taxi or public transportation instead.   If you feel pain relief, the pain will return in several hours or days when the anesthetic wears off.   You may feel pain relief 2-14 days after the procedure. The amount of time this relief lasts varies from person to person.   It is normal to feel some tenderness over the injected area(s) for 2 days following the procedure.   If you have diabetes, you may have a temporary increase in blood sugar.   This information is not intended to replace advice given to you by your health care provider. Make sure you discuss any questions you have with your health care provider.   Document Released: 12/04/2006 Document Revised: 08/05/2014 Document Reviewed: 05/04/2012 Elsevier  Interactive Patient Education [employer].

## 2016-05-15 ENCOUNTER — Telehealth: Payer: Self-pay

## 2016-05-15 NOTE — Telephone Encounter (Signed)
Post procedure call. Spoke with patient daughter who states the patient is doing well and will call back if any concerns arise.

## 2016-06-04 ENCOUNTER — Ambulatory Visit: Payer: Medicare Other | Attending: Pain Medicine | Admitting: Pain Medicine

## 2016-06-04 ENCOUNTER — Encounter: Payer: Self-pay | Admitting: Pain Medicine

## 2016-06-04 VITALS — BP 178/50 | HR 58 | Temp 98.4°F | Resp 16 | Ht 66.0 in | Wt 134.0 lb

## 2016-06-04 DIAGNOSIS — E785 Hyperlipidemia, unspecified: Secondary | ICD-10-CM | POA: Insufficient documentation

## 2016-06-04 DIAGNOSIS — G894 Chronic pain syndrome: Secondary | ICD-10-CM | POA: Insufficient documentation

## 2016-06-04 DIAGNOSIS — M488X6 Other specified spondylopathies, lumbar region: Secondary | ICD-10-CM | POA: Diagnosis not present

## 2016-06-04 DIAGNOSIS — I129 Hypertensive chronic kidney disease with stage 1 through stage 4 chronic kidney disease, or unspecified chronic kidney disease: Secondary | ICD-10-CM | POA: Insufficient documentation

## 2016-06-04 DIAGNOSIS — Z823 Family history of stroke: Secondary | ICD-10-CM | POA: Diagnosis not present

## 2016-06-04 DIAGNOSIS — M48061 Spinal stenosis, lumbar region without neurogenic claudication: Secondary | ICD-10-CM | POA: Diagnosis not present

## 2016-06-04 DIAGNOSIS — M1611 Unilateral primary osteoarthritis, right hip: Secondary | ICD-10-CM | POA: Insufficient documentation

## 2016-06-04 DIAGNOSIS — M4316 Spondylolisthesis, lumbar region: Secondary | ICD-10-CM | POA: Insufficient documentation

## 2016-06-04 DIAGNOSIS — M545 Low back pain: Secondary | ICD-10-CM | POA: Insufficient documentation

## 2016-06-04 DIAGNOSIS — Z79899 Other long term (current) drug therapy: Secondary | ICD-10-CM | POA: Diagnosis not present

## 2016-06-04 DIAGNOSIS — N183 Chronic kidney disease, stage 3 (moderate): Secondary | ICD-10-CM | POA: Insufficient documentation

## 2016-06-04 DIAGNOSIS — E039 Hypothyroidism, unspecified: Secondary | ICD-10-CM | POA: Insufficient documentation

## 2016-06-04 DIAGNOSIS — M1288 Other specific arthropathies, not elsewhere classified, other specified site: Secondary | ICD-10-CM | POA: Diagnosis not present

## 2016-06-04 DIAGNOSIS — M47816 Spondylosis without myelopathy or radiculopathy, lumbar region: Secondary | ICD-10-CM

## 2016-06-04 DIAGNOSIS — F418 Other specified anxiety disorders: Secondary | ICD-10-CM | POA: Insufficient documentation

## 2016-06-04 DIAGNOSIS — Z79891 Long term (current) use of opiate analgesic: Secondary | ICD-10-CM | POA: Diagnosis not present

## 2016-06-04 DIAGNOSIS — M79604 Pain in right leg: Secondary | ICD-10-CM | POA: Diagnosis not present

## 2016-06-04 MED ORDER — TRAMADOL HCL 50 MG PO TABS: 50.0000 mg | ORAL_TABLET | Freq: Three times a day (TID) | ORAL | 5 refills | Status: DC | PRN

## 2016-06-04 NOTE — Progress Notes (Signed)
Patient's Name: Brittney Simpson  MRN: [id number]  Referring Provider: Arlis Porta., MD  DOB: October 06, 1927  PCP: Dicky Doe, MD  DOS: 06/04/2016  Note by: Kathlen Brunswick. Dossie Arbour, MD  Service setting: Ambulatory outpatient  Specialty: Interventional Pain Management  Location: [facility] (AMB) Pain Management Facility    Patient type: Established   Primary Reason(s) for Visit: Encounter for prescription drug management & post-procedure evaluation of chronic illness with mild to moderate exacerbation(Level of risk: moderate) CC: Back Pain (lower)  HPI  Brittney Simpson is a 82 y.o. year old, female patient, who comes today for a post-procedure evaluation and medication management. She has Chronic pain; Long term current use of opiate analgesic; Long term prescription opiate use; Opiate use (15 MME/Day); Encounter for therapeutic drug level monitoring; Lumbar facet syndrome (Bilateral) (R>L); Chronic low back pain (Bilateral) (R>L); Spondylolisthesis of lumbosacral region (L2-3 and L5-S1); Lumbar spinal stenosis (9 mm at L3-4); Trochanteric bursitis (Right); Chronic hip pain (Right); Osteoarthritis of hip (Right); Chronic sacroiliac joint pain (Right); Chronic lumbar radicular pain (Right); Chronic lower extremity pain (Right); Myofascial pain; Encounter for chronic pain management; HBP (high blood pressure); CKD (chronic kidney disease) stage 3, GFR 30-59 ml/min; Skin lesion of scalp; Chronic anxiety; Insomnia; Hypothyroidism; and Lumbar spondylosis on her problem list. Her primarily concern today is the Back Pain (lower)  Pain Assessment: Self-Reported Pain Score: 0-No pain/10             Reported level is compatible with observation.       Pain Type: Chronic pain Pain Location: Back Pain Orientation: Lower Pain Descriptors / Indicators: Aching, Constant Pain Frequency: Constant  Brittney Simpson was last seen on 05/14/2016 for a procedure. During today's appointment we reviewed Brittney Simpson's  post-procedure results, as well as her outpatient medication regimen.  Further details on both, my assessment(s), as well as the proposed treatment plan, please see below.  Controlled Substance Pharmacotherapy Assessment REMS (Risk Evaluation and Mitigation Strategy)  Analgesic: Tramadol 50 mg 1 tablet by mouth every 8 hours (150 mg/day of tramadol) MME/day: 15 mg/day. Evon Slack, RN  06/04/2016  1:51 PM  Sign at close encounter Nursing Pain Medication Assessment:  Safety precautions to be maintained throughout the outpatient stay will include: orient to surroundings, keep bed in low position, maintain call bell within reach at all times, provide assistance with transfer out of bed and ambulation.  Medication Inspection Compliance: Pill count conducted under aseptic conditions, in front of the patient. Neither the pills nor the bottle was removed from the patient's sight at any time. Once count was completed pills were immediately returned to the patient in their original bottle.  Medication: Tramadol (Ultram) Pill Count: 79 of 90 pills remain Bottle Appearance: Standard pharmacy container. Clearly labeled. Filled Date: 55 / 03/ 2017 Medication last intake: 06/04/2016   Pharmacokinetics: Liberation and absorption (onset of action): WNL Distribution (time to peak effect): WNL Metabolism and excretion (duration of action): WNL         Pharmacodynamics: Desired effects: Analgesia: The patient reports >50% benefit. Reported improvement in function: The patient reports medication allows her to accomplish basic ADLs. Clinically meaningful improvement in function (CMIF): Sustained CMIF goals met Perceived effectiveness: Described as relatively effective, allowing for increase in activities of daily living (ADL) Undesirable effects: Side-effects or Adverse reactions: None reported Monitoring: [place] PMP: Online review of the past 65-monthperiod conducted. Compliant with practice rules and  regulations List of all UDS test(s) done:  Lab Results  Component  Value Date   TOXASSSELUR FINAL 05/24/2015   Last UDS on record: [employer]ox[employer] Select 13  Date Value Ref Range Status  05/24/2015 FINAL  Final    Comment:    ==================================================================== TOXASSURE SELECT 13 (MW) ==================================================================== Test                             Result       Flag       Units Drug Present   7-aminoclonazepam              270                     ng/mg creat    7-aminoclonazepam is an expected metabolite of clonazepam. Source    of clonazepam is a scheduled prescription medication.   Tramadol                       PRESENT   O-Desmethyltramadol            PRESENT   N-Desmethyltramadol            PRESENT    Source of tramadol is a prescription medication.    O-desmethyltramadol and N-desmethyltramadol are expected    metabolites of tramadol. ==================================================================== Test                      Result    Flag   Units      Ref Range   Creatinine              47               mg/dL      >=20 ==================================================================== Declared Medications:  Medication list was not provided. ==================================================================== For clinical consultation, please call [phone]. ====================================================================    UDS interpretation: Compliant          Medication Assessment Form: Reviewed. Patient indicates being compliant with therapy Treatment compliance: Compliant Risk Assessment Profile: Aberrant behavior: See prior evaluations. None observed or detected today Comorbid factors increasing risk of overdose: See prior notes. No additional risks detected today Risk of substance use disorder (SUD): Low Opioid Risk Tool (ORT) Total Score: 0  Interpretation Table:  Score <3 = Low  Risk for SUD  Score between 4-7 = Moderate Risk for SUD  Score >8 = High Risk for Opioid Abuse   Risk Mitigation Strategies:  Patient Counseling: Covered Patient-Prescriber Agreement (PPA): Present and active  Notification to other healthcare providers: Done  Pharmacologic Plan: No change in therapy, at this time  Post-Procedure Assessment  05/14/2016 Procedure: Palliative right-sided lumbar facet block under fluoroscopic guidance and IV sedation. Influential Factors: BMI: 21.63 kg/m Intra-procedural challenges: None observed Assessment challenges: None detected         Post-procedural side-effects, adverse reactions, or complications: None reported Reported issues: None  Sedation: Sedation provided. When no sedatives are used, the analgesic levels obtained are directly associated to the effectiveness of the local anesthetics. However, when sedation is provided, the level of analgesia obtained during the initial 1 hour following the intervention, is believed to be the result of a combination of factors. These factors may include, but are not limited to: 1. The effectiveness of the local anesthetics used. 2. The effects of the analgesic(s) and/or anxiolytic(s) used. 3. The degree of discomfort experienced by the patient at the time of the procedure. 4. The patients ability and reliability in  recalling and recording the events. 5. The presence and influence of possible secondary gains and/or psychosocial factors. Reported result: Relief experienced during the 1st hour after the procedure: 100 % (Ultra-Short Term Relief) Interpretative annotation: Analgesia during this period is likely to be Local Anesthetic and/or IV Sedative (Analgesic/Anxiolitic) related.          Effects of local anesthetic: The analgesic effects attained during this period are directly associated to the localized infiltration of local anesthetics and therefore cary significant diagnostic value as to the etiological  location, or anatomical origin, of the pain. Expected duration of relief is directly dependent on the pharmacodynamics of the local anesthetic used. Long-acting (4-6 hours) anesthetics used.  Reported result: Relief during the next 4 to 6 hour after the procedure: 100 % (Short-Term Relief) Interpretative annotation: Complete relief would suggest area to be the source of the pain.          Long-term benefit: Defined as the period of time past the expected duration of local anesthetics. With the possible exception of prolonged sympathetic blockade from the local anesthetics, benefits during this period are typically attributed to, or associated with, other factors such as analgesic sensory neuropraxia, antiinflammatory effects, or beneficial biochemical changes provided by agents other than the local anesthetics Reported result: Extended relief following procedure: 100 % (Long-Term Relief) Interpretative annotation: Good relief. This could suggest inflammation to be a significant component in the etiology to the pain.          Current benefits: Defined as persistent relief that continues at this point in time.   Reported results: Treated area: 100 %       Interpretative annotation: Ongoing benefits would suggest effective therapeutic approach  Interpretation: Results would suggest a successful therapeutic intervention.          Laboratory Chemistry  Inflammation Markers No results found for: ESRSEDRATE, CRP Renal Function Lab Results  Component Value Date   BUN 13 11/24/2014   CREATININE 0.81 11/24/2014   GFRAA >60 11/24/2014   GFRNONAA >60 11/24/2014   Hepatic Function Lab Results  Component Value Date   AST 26 07/08/2014   ALT 25 07/08/2014   ALBUMIN 3.4 07/08/2014   Electrolytes Lab Results  Component Value Date   NA 131 (L) 11/24/2014   K 4.2 11/24/2014   CL 97 (L) 11/24/2014   CALCIUM 8.9 11/24/2014   MG 2.1 01/14/2014   Pain Modulating Vitamins No results found for:  VD25OH, OZ224MG5OIB, BC4888BV6, XI5038UE2, 25OHVITD1, 25OHVITD2, 25OHVITD3, VITAMINB12 Coagulation Parameters Lab Results  Component Value Date   PLT 109 (L) 11/24/2014   Cardiovascular Lab Results  Component Value Date   HGB 12.3 11/24/2014   HCT 38.9 11/24/2014   Note: Lab results reviewed.  Recent Diagnostic Imaging Review  Dg C-arm 1-60 Min-no Report  Result Date: 05/14/2016 CLINICAL DATA: Assistance in needle guidance and placement for procedures requiring needle placement in or near specific anatomical locations not easily accessible without such assistance. C-ARM 1-60 MINUTES Fluoroscopy was utilized by the requesting physician.  No radiographic interpretation.   Note: Imaging results reviewed.  Meds  The patient has a current medication list which includes the following prescription(s): amlodipine, brimonidine, clonazepam, vitamin d2, furosemide, latanoprost, levothyroxine, lisinopril, metoprolol succinate, omeprazole, sulfamethoxazole-trimethoprim, tramadol, and trazodone.  Current Outpatient Prescriptions on File Prior to Visit  Medication Sig  . amLODipine (NORVASC) 5 MG tablet Take 1 tablet (5 mg total) by mouth daily.  . brimonidine (ALPHAGAN) 0.2 % ophthalmic solution   . clonazePAM (KLONOPIN) 0.5  MG tablet TAKE 1/2 TABLET BY MOUTH TWICE DAILY FOR ANXIETY.  . Ergocalciferol (VITAMIN D2) 2000 UNITS TABS Take 1 capsule by mouth.  . furosemide (LASIX) 20 MG tablet Take 1 tablet (20 mg total) by mouth daily.  Marland Kitchen latanoprost (XALATAN) 0.005 % ophthalmic solution   . levothyroxine (SYNTHROID, LEVOTHROID) 125 MCG tablet TAKE 1 TABLET BY MOUTH ONCE DAILY ON AN EMPTY STOMACH. WAIT 30 MINUTES BEFORE TAKING OTHER MEDS.  Marland Kitchen lisinopril (PRINIVIL,ZESTRIL) 40 MG tablet Take 1 tablet (40 mg total) by mouth daily.  . metoprolol succinate (TOPROL-XL) 100 MG 24 hr tablet TAKE 1 TABLET BY MOUTH TWICE DAILY.  Marland Kitchen omeprazole (PRILOSEC) 20 MG capsule TAKE 1 CAPSULE BY MOUTH ONCE DAILY.  Marland Kitchen  sulfamethoxazole-trimethoprim (BACTRIM,SEPTRA) 400-80 MG tablet Take 1 tablet by mouth daily.  . traZODone (DESYREL) 50 MG tablet TAKE 1/2 TO 1 TABLET BY MOUTH AT BEDTIMEAS NEEDED FOR SLEEP   No current facility-administered medications on file prior to visit.    ROS  Constitutional: Denies any fever or chills Gastrointestinal: No reported hemesis, hematochezia, vomiting, or acute GI distress Musculoskeletal: Denies any acute onset joint swelling, redness, loss of ROM, or weakness Neurological: No reported episodes of acute onset apraxia, aphasia, dysarthria, agnosia, amnesia, paralysis, loss of coordination, or loss of consciousness  Allergies  Ms. Blunck has no active allergies.  [place]  Drug: Ms. Formosa  reports that she does not use drugs. Alcohol:  reports that she does not drink alcohol. Tobacco:  reports that she has never smoked. She has never used smokeless tobacco. Medical:  has a past medical history of Anxiety; Back ache; Hiatal hernia; Hyperlipidemia; Hypertension; Hypothyroid; Osteopenia; and Thyroid disease. Family: family history includes Cancer in her father; Stroke in her mother.  Past Surgical History:  Procedure Laterality Date  . HEMORRHOID SURGERY    . HERNIA REPAIR    . HERNIA REPAIR     Constitutional Exam  General appearance: Well nourished, well developed, and well hydrated. In no apparent acute distress Vitals:   06/04/16 1338  BP: (!) 178/50  Pulse: (!) 58  Resp: 16  Temp: 98.4 F (36.9 C)  TempSrc: Oral  SpO2: 98%  Weight: 134 lb (60.8 kg)  Height: _0  (1.676 m)   BMI Assessment: Estimated body mass index is 21.63 kg/m as calculated from the following:   Height as of this encounter: _1  (1.676 m).   Weight as of this encounter: 134 lb (60.8 kg).  BMI interpretation table: BMI level Category Range association with higher incidence of chronic pain  <18 kg/m2 Underweight   18.5-24.9 kg/m2 Ideal body weight   25-29.9 kg/m2 Overweight  Increased incidence by 20%  30-34.9 kg/m2 Obese (Class I) Increased incidence by 68%  35-39.9 kg/m2 Severe obesity (Class II) Increased incidence by 136%  >40 kg/m2 Extreme obesity (Class III) Increased incidence by 254%   BMI Readings from Last 4 Encounters:  06/04/16 21.63 kg/m  05/14/16 21.63 kg/m  04/23/16 21.79 kg/m  12/27/15 21.79 kg/m   Wt Readings from Last 4 Encounters:  06/04/16 134 lb (60.8 kg)  05/14/16 134 lb (60.8 kg)  04/23/16 135 lb (61.2 kg)  12/27/15 135 lb (61.2 kg)  Psych/Mental status: Alert, oriented x 3 (person, place, & time) Eyes: PERLA Respiratory: No evidence of acute respiratory distress  Cervical Spine Exam  Inspection: No masses, redness, or swelling Alignment: Symmetrical Functional ROM: Unrestricted ROM Stability: No instability detected Muscle strength & Tone: Functionally intact Sensory: Unimpaired Palpation: Non-contributory  Upper Extremity (UE)  Exam    Side: Right upper extremity  Side: Left upper extremity  Inspection: No masses, redness, swelling, or asymmetry  Inspection: No masses, redness, swelling, or asymmetry  Functional ROM: Unrestricted ROM         Functional ROM: Unrestricted ROM          Muscle strength & Tone: Functionally intact  Muscle strength & Tone: Functionally intact  Sensory: Unimpaired  Sensory: Unimpaired  Palpation: Non-contributory  Palpation: Non-contributory   Thoracic Spine Exam  Inspection: No masses, redness, or swelling Alignment: Symmetrical Functional ROM: Unrestricted ROM Stability: No instability detected Sensory: Unimpaired Muscle strength & Tone: Functionally intact Palpation: Non-contributory  Lumbar Spine Exam  Inspection: No masses, redness, or swelling Alignment: Symmetrical Functional ROM: Unrestricted ROM Stability: No instability detected Muscle strength & Tone: Functionally intact Sensory: Unimpaired Palpation: Non-contributory Provocative Tests: Lumbar Hyperextension and  rotation test: evaluation deferred today       Patrick's Maneuver: evaluation deferred today              Gait & Posture Assessment  Ambulation: Unassisted Gait: Relatively normal for age and body habitus Posture: WNL   Lower Extremity Exam    Side: Right lower extremity  Side: Left lower extremity  Inspection: No masses, redness, swelling, or asymmetry  Inspection: No masses, redness, swelling, or asymmetry  Functional ROM: Unrestricted ROM          Functional ROM: Unrestricted ROM          Muscle strength & Tone: Functionally intact  Muscle strength & Tone: Functionally intact  Sensory: Unimpaired  Sensory: Unimpaired  Palpation: Non-contributory  Palpation: Non-contributory   Assessment  Primary Diagnosis & Pertinent Problem List: The primary encounter diagnosis was Chronic pain syndrome. A diagnosis of Lumbar facet syndrome (Bilateral) (R>L) was also pertinent to this visit.  Visit Diagnosis: 1. Chronic pain syndrome   2. Lumbar facet syndrome (Bilateral) (R>L)    Plan of Care  Pharmacotherapy (Medications Ordered): Meds ordered this encounter  Medications  . traMADol (ULTRAM) 50 MG tablet    Sig: Take 1 tablet (50 mg total) by mouth 3 (three) times daily as needed for severe pain.    Dispense:  90 tablet    Refill:  5    Do not place this medication, or any other prescription from our practice, on "Automatic Refill". Patient may have prescription filled one day early if pharmacy is closed on scheduled refill date.   New Prescriptions   No medications on file   Medications administered today: Ms. Held had no medications administered during this visit. Lab-work, procedure(s), and/or referral(s): Orders Placed This Encounter  Procedures  . LUMBAR FACET(MEDIAL BRANCH NERVE BLOCK) MBNB   Imaging and/or referral(s): None  Interventional therapies: Planned, scheduled, and/or pending:   None at this time.    Considering:   Diagnostic bilateral lumbar facet block  #3 under fluoroscopic guidance and IV sedation.  Possible bilateral lumbar facet radiofrequency ablation.  Diagnostic right-sided intra-articular hip joint injection under fluoroscopic guidance, no sedation.  Palliative right-sided L34 lumbar epidural steroid injection under fluoroscopic guidance, no sedation.  Diagnostic right-sided transforaminal L3-4 epidural steroid injection under fluoroscopic guidance, no sedation.  Right-sided trochanteric bursa injection under fluoroscopic guidance, no sedation.  Diagnostic right-sided sacroiliac joint block under fluoroscopic guidance, no sedation.    Palliative PRN treatment(s):   Diagnostic bilateral lumbar facet block #3 under fluoroscopic guidance and IV sedation.  Diagnostic right-sided intra-articular hip joint injection under fluoroscopic guidance, no sedation.  Palliative right-sided L34  lumbar epidural steroid injection under fluoroscopic guidance, no sedation.  Diagnostic right-sided transforaminal L3-4 epidural steroid injection under fluoroscopic guidance, no sedation.  Right-sided trochanteric bursa injection under fluoroscopic guidance, no sedation.  Diagnostic right-sided sacroiliac joint block under fluoroscopic guidance, no sedation.    Provider-requested follow-up: Return in about 6 months (around 12/02/2016) for Med-Mgmt, In addition, (PRN) Procedure.  Future Appointments Date Time Provider [place]  11/05/2016 1:15 PM Milinda Pointer, [place] None   Primary Care Physician: Dicky Doe, MD Location: [facility] Note by: Kathlen Brunswick. Dossie Arbour, M.D, DABA, DABAPM, DABPM, DABIPP, FIPP  Pain Score Disclaimer: We use the NRS-11 scale. This is a self-reported, subjective measurement of pain severity with only modest accuracy. It is used primarily to identify changes within a particular patient. It must be understood that outpatient pain scales are significantly less accurate that those used  for research, where they can be applied under ideal controlled circumstances with minimal exposure to variables. In reality, the score is likely to be a combination of pain intensity and pain affect, where pain affect describes the degree of emotional arousal or changes in action readiness caused by the sensory experience of pain. Factors such as social and work situation, setting, emotional state, anxiety levels, expectation, and prior pain experience may influence pain perception and show large inter-individual differences that may also be affected by time variables.  Patient instructions provided during this appointment: Patient Instructions  Facet Joint Block, Care After Refer to this sheet in the next few weeks. These instructions provide you with information on caring for yourself after your procedure. Your health care provider may also give you more specific instructions. Your treatment has been planned according to current medical practices, but problems sometimes occur. Call your health care provider if you have any problems or questions after your procedure. HOME CARE INSTRUCTIONS   Keep track of the amount of pain relief you feel and how long it lasts.  Limit pain medicine within the first 4-6 hours after the procedure as directed by your health care provider.  Resume taking dietary supplements and medicines as directed by your health care provider.  You may resume your regular diet.  Do not apply heat near or over the injection site(s) for 24 hours.   Do not take a bath or soak in water (such as a pool or lake) for 24 hours.  Do not drive for 24 hours unless approved by your health care provider.  Avoid strenuous activity for 24 hours.  Remove your bandages the morning after the procedure.   If the injection site is tender, applying an ice pack may relieve some tenderness. To do this:  Put ice in a bag.  Place a towel between your skin and the bag.  Leave the ice on for  15-20 minutes, 3-4 times a day.  Keep follow-up appointments as directed by your health care provider. SEEK MEDICAL CARE IF:   Your pain is not controlled by your medicines.   There is drainage from the injection site.   There is significant bleeding or swelling at the injection site.  You have diabetes and your blood sugar is above 180 mg/dL. SEEK IMMEDIATE MEDICAL CARE IF:   You develop a fever of 101F (38.3C) or greater.   You have worsening pain or swelling around the injection site.   You have red streaking around the injection site.   You develop severe pain that is not controlled by your medicines.  You develop a headache, stiff neck, nausea, or vomiting.   Your eyes become very sensitive to light.   You have weakness, paralysis, or tingling in your arms or legs that was not present before the procedure.   You develop difficulty urinating or breathing.    This information is not intended to replace advice given to you by your health care provider. Make sure you discuss any questions you have with your health care provider.   Document Released: 07/01/2012 Document Revised: 08/05/2014 Document Reviewed: 07/01/2012 Elsevier Interactive Patient Education 2016 [employer] Facet Joint Block The facet joints connect the bones of the spine (vertebrae). They make it possible for you to bend, twist, and make other movements with your spine. They also prevent you from overbending, overtwisting, and making other excessive movements.  A facet joint block is a procedure where a numbing medicine (anesthetic) is injected into a facet joint. Often, a type of anti-inflammatory medicine called a steroid is also injected. A facet joint block may be done for two reasons:   Diagnosis. A facet joint block may be done as a test to see whether neck or back pain is caused by a worn-down or infected facet joint. If the pain gets better after a facet joint block, it means the pain is  probably coming from the facet joint. If the pain does not get better, it means the pain is probably not coming from the facet joint.   Therapy. A facet joint block may be done to relieve neck or back pain caused by a facet joint. A facet joint block is only done as a therapy if the pain does not improve with medicine, exercise programs, physical therapy, and other forms of pain management. LET [facility] CARE PROVIDER KNOW ABOUT:   Any allergies you have.   All medicines you are taking, including vitamins, herbs, eyedrops, and over-the-counter medicines and creams.   Previous problems you or members of your family have had with the use of anesthetics.   Any blood disorders you have had.   Other health problems you have. RISKS AND COMPLICATIONS Generally, having a facet joint block is safe. However, as with any procedure, complications can occur. Possible complications associated with having a facet joint block include:   Bleeding.   Injury to a nerve near the injection site.   Pain at the injection site.   Weakness or numbness in areas controlled by nerves near the injection site.   Infection.   Temporary fluid retention.   Allergic reaction to anesthetics or medicines used during the procedure. BEFORE THE PROCEDURE   Follow your health care provider's instructions if you are taking dietary supplements or medicines. You may need to stop taking them or reduce your dosage.   Do not take any new dietary supplements or medicines without asking your health care provider first.   Follow your health care provider's instructions about eating and drinking before the procedure. You may need to stop eating and drinking several hours before the procedure.   Arrange to have an adult drive you home after the procedure. PROCEDURE  You may need to remove your clothing and dress in an open-back gown so that your health care provider can access your spine.   The procedure  will be done while you are lying on an X-ray table. Most of the time you will be asked to lie on your stomach, but you may be asked to lie in a different position if an injection will be  made in your neck.   Special machines will be used to monitor your oxygen levels, heart rate, and blood pressure.   If an injection will be made in your neck, an intravenous (IV) tube will be inserted into one of your veins. Fluids and medicine will flow directly into your body through the IV tube.   The area over the facet joint where the injection will be made will be cleaned with an antiseptic soap. The surrounding skin will be covered with sterile drapes.   An anesthetic will be applied to your skin to make the injection area numb. You may feel a temporary stinging or burning sensation.   A video X-ray machine will be used to locate the joint. A contrast dye may be injected into the facet joint area to help with locating the joint.   When the joint is located, an anesthetic medicine will be injected into the joint through the needle.   Your health care provider will ask you whether you feel pain relief. If you do feel relief, a steroid may be injected to provide pain relief for a longer period of time. If you do not feel relief or feel only partial relief, additional injections of an anesthetic may be made in other facet joints.   The needle will be removed, the skin will be cleansed, and bandages will be applied.  AFTER THE PROCEDURE   You will be observed for 15-30 minutes before being allowed to go home. Do not drive. Have an adult drive you or take a taxi or public transportation instead.   If you feel pain relief, the pain will return in several hours or days when the anesthetic wears off.   You may feel pain relief 2-14 days after the procedure. The amount of time this relief lasts varies from person to person.   It is normal to feel some tenderness over the injected area(s) for 2 days  following the procedure.   If you have diabetes, you may have a temporary increase in blood sugar.   This information is not intended to replace advice given to you by your health care provider. Make sure you discuss any questions you have with your health care provider.   Document Released: 12/04/2006 Document Revised: 08/05/2014 Document Reviewed: 05/04/2012 Elsevier Interactive Patient Education 2016 [employer] Pain Management Discharge Instructions  General Discharge Instructions :  If you need to reach your doctor call: Monday-Friday 8:00 am - 4:00 pm at [phone] or toll free [phone].  After clinic hours [phone] to have operator reach doctor.  Bring all of your medication bottles to all your appointments in the pain clinic.  To cancel or reschedule your appointment with Pain Management please remember to call 24 hours in advance to avoid a fee.  Refer to the educational materials which you have been given on: General Risks, I had my Procedure. Discharge Instructions, Post Sedation.  Post Procedure Instructions:  The drugs you were given will stay in your system until tomorrow, so for the next 24 hours you should not drive, make any legal decisions or drink any alcoholic beverages.  You may eat anything you prefer, but it is better to start with liquids then soups and crackers, and gradually work up to solid foods.  Please notify your doctor immediately if you have any unusual bleeding, trouble breathing or pain that is not related to your normal pain.  Depending on the type of procedure that was done, some parts of your body may  feel week and/or numb.  This usually clears up by tonight or the next day.  Walk with the use of an assistive device or accompanied by an adult for the 24 hours.  You may use ice on the affected area for the first 24 hours.  Put ice in a Ziploc bag and cover with a towel and place against area 15 minutes on 15 minutes off.  You may  switch to heat after 24 hours.

## 2016-06-04 NOTE — Progress Notes (Signed)
Nursing Pain Medication Assessment:  Safety precautions to be maintained throughout the outpatient stay will include: orient to surroundings, keep bed in low position, maintain call bell within reach at all times, provide assistance with transfer out of bed and ambulation.  Medication Inspection Compliance: Pill count conducted under aseptic conditions, in front of the patient. Neither the pills nor the bottle was removed from the patient's sight at any time. Once count was completed pills were immediately returned to the patient in their original bottle.  Medication: Tramadol (Ultram) Pill Count: 79 of 90 pills remain Bottle Appearance: Standard pharmacy container. Clearly labeled. Filled Date: 43 / 03/ 2017 Medication last intake: 06/04/2016

## 2016-06-04 NOTE — Patient Instructions (Signed)
Facet Joint Block, Care After Refer to this sheet in the next few weeks. These instructions provide you with information on caring for yourself after your procedure. Your health care provider may also give you more specific instructions. Your treatment has been planned according to current medical practices, but problems sometimes occur. Call your health care provider if you have any problems or questions after your procedure. HOME CARE INSTRUCTIONS   Keep track of the amount of pain relief you feel and how long it lasts.  Limit pain medicine within the first 4-6 hours after the procedure as directed by your health care provider.  Resume taking dietary supplements and medicines as directed by your health care provider.  You may resume your regular diet.  Do not apply heat near or over the injection site(s) for 24 hours.   Do not take a bath or soak in water (such as a pool or lake) for 24 hours.  Do not drive for 24 hours unless approved by your health care provider.  Avoid strenuous activity for 24 hours.  Remove your bandages the morning after the procedure.   If the injection site is tender, applying an ice pack may relieve some tenderness. To do this:  Put ice in a bag.  Place a towel between your skin and the bag.  Leave the ice on for 15-20 minutes, 3-4 times a day.  Keep follow-up appointments as directed by your health care provider. SEEK MEDICAL CARE IF:   Your pain is not controlled by your medicines.   There is drainage from the injection site.   There is significant bleeding or swelling at the injection site.  You have diabetes and your blood sugar is above 180 mg/dL. SEEK IMMEDIATE MEDICAL CARE IF:   You develop a fever of 101F (38.3C) or greater.   You have worsening pain or swelling around the injection site.   You have red streaking around the injection site.   You develop severe pain that is not controlled by your medicines.   You develop  a headache, stiff neck, nausea, or vomiting.   Your eyes become very sensitive to light.   You have weakness, paralysis, or tingling in your arms or legs that was not present before the procedure.   You develop difficulty urinating or breathing.    This information is not intended to replace advice given to you by your health care provider. Make sure you discuss any questions you have with your health care provider.   Document Released: 07/01/2012 Document Revised: 08/05/2014 Document Reviewed: 07/01/2012 Elsevier Interactive Patient Education 2016 [employer] Facet Joint Block The facet joints connect the bones of the spine (vertebrae). They make it possible for you to bend, twist, and make other movements with your spine. They also prevent you from overbending, overtwisting, and making other excessive movements.  A facet joint block is a procedure where a numbing medicine (anesthetic) is injected into a facet joint. Often, a type of anti-inflammatory medicine called a steroid is also injected. A facet joint block may be done for two reasons:   Diagnosis. A facet joint block may be done as a test to see whether neck or back pain is caused by a worn-down or infected facet joint. If the pain gets better after a facet joint block, it means the pain is probably coming from the facet joint. If the pain does not get better, it means the pain is probably not coming from the facet joint.   Therapy.  A facet joint block may be done to relieve neck or back pain caused by a facet joint. A facet joint block is only done as a therapy if the pain does not improve with medicine, exercise programs, physical therapy, and other forms of pain management. LET [facility] CARE PROVIDER KNOW ABOUT:   Any allergies you have.   All medicines you are taking, including vitamins, herbs, eyedrops, and over-the-counter medicines and creams.   Previous problems you or members of your family have had with the  use of anesthetics.   Any blood disorders you have had.   Other health problems you have. RISKS AND COMPLICATIONS Generally, having a facet joint block is safe. However, as with any procedure, complications can occur. Possible complications associated with having a facet joint block include:   Bleeding.   Injury to a nerve near the injection site.   Pain at the injection site.   Weakness or numbness in areas controlled by nerves near the injection site.   Infection.   Temporary fluid retention.   Allergic reaction to anesthetics or medicines used during the procedure. BEFORE THE PROCEDURE   Follow your health care provider's instructions if you are taking dietary supplements or medicines. You may need to stop taking them or reduce your dosage.   Do not take any new dietary supplements or medicines without asking your health care provider first.   Follow your health care provider's instructions about eating and drinking before the procedure. You may need to stop eating and drinking several hours before the procedure.   Arrange to have an adult drive you home after the procedure. PROCEDURE  You may need to remove your clothing and dress in an open-back gown so that your health care provider can access your spine.   The procedure will be done while you are lying on an X-ray table. Most of the time you will be asked to lie on your stomach, but you may be asked to lie in a different position if an injection will be made in your neck.   Special machines will be used to monitor your oxygen levels, heart rate, and blood pressure.   If an injection will be made in your neck, an intravenous (IV) tube will be inserted into one of your veins. Fluids and medicine will flow directly into your body through the IV tube.   The area over the facet joint where the injection will be made will be cleaned with an antiseptic soap. The surrounding skin will be covered with sterile  drapes.   An anesthetic will be applied to your skin to make the injection area numb. You may feel a temporary stinging or burning sensation.   A video X-ray machine will be used to locate the joint. A contrast dye may be injected into the facet joint area to help with locating the joint.   When the joint is located, an anesthetic medicine will be injected into the joint through the needle.   Your health care provider will ask you whether you feel pain relief. If you do feel relief, a steroid may be injected to provide pain relief for a longer period of time. If you do not feel relief or feel only partial relief, additional injections of an anesthetic may be made in other facet joints.   The needle will be removed, the skin will be cleansed, and bandages will be applied.  AFTER THE PROCEDURE   You will be observed for 15-30 minutes before being  allowed to go home. Do not drive. Have an adult drive you or take a taxi or public transportation instead.   If you feel pain relief, the pain will return in several hours or days when the anesthetic wears off.   You may feel pain relief 2-14 days after the procedure. The amount of time this relief lasts varies from person to person.   It is normal to feel some tenderness over the injected area(s) for 2 days following the procedure.   If you have diabetes, you may have a temporary increase in blood sugar.   This information is not intended to replace advice given to you by your health care provider. Make sure you discuss any questions you have with your health care provider.   Document Released: 12/04/2006 Document Revised: 08/05/2014 Document Reviewed: 05/04/2012 Elsevier Interactive Patient Education 2016 [employer] Pain Management Discharge Instructions  General Discharge Instructions :  If you need to reach your doctor call: Monday-Friday 8:00 am - 4:00 pm at [phone] or toll free [phone].  After clinic hours  [phone] to have operator reach doctor.  Bring all of your medication bottles to all your appointments in the pain clinic.  To cancel or reschedule your appointment with Pain Management please remember to call 24 hours in advance to avoid a fee.  Refer to the educational materials which you have been given on: General Risks, I had my Procedure. Discharge Instructions, Post Sedation.  Post Procedure Instructions:  The drugs you were given will stay in your system until tomorrow, so for the next 24 hours you should not drive, make any legal decisions or drink any alcoholic beverages.  You may eat anything you prefer, but it is better to start with liquids then soups and crackers, and gradually work up to solid foods.  Please notify your doctor immediately if you have any unusual bleeding, trouble breathing or pain that is not related to your normal pain.  Depending on the type of procedure that was done, some parts of your body may feel week and/or numb.  This usually clears up by tonight or the next day.  Walk with the use of an assistive device or accompanied by an adult for the 24 hours.  You may use ice on the affected area for the first 24 hours.  Put ice in a Ziploc bag and cover with a towel and place against area 15 minutes on 15 minutes off.  You may switch to heat after 24 hours.

## 2016-06-12 ENCOUNTER — Encounter: Payer: Medicare Other | Admitting: Pain Medicine

## 2016-06-14 ENCOUNTER — Emergency Department: Payer: Medicare Other

## 2016-06-14 ENCOUNTER — Emergency Department
Admission: EM | Admit: 2016-06-14 | Discharge: 2016-06-14 | Disposition: A | Discharge status: Home / Self Care | Payer: Medicare Other | Attending: Emergency Medicine | Admitting: Emergency Medicine

## 2016-06-14 DIAGNOSIS — E039 Hypothyroidism, unspecified: Secondary | ICD-10-CM | POA: Diagnosis not present

## 2016-06-14 DIAGNOSIS — I129 Hypertensive chronic kidney disease with stage 1 through stage 4 chronic kidney disease, or unspecified chronic kidney disease: Secondary | ICD-10-CM | POA: Insufficient documentation

## 2016-06-14 DIAGNOSIS — K5901 Slow transit constipation: Secondary | ICD-10-CM | POA: Diagnosis not present

## 2016-06-14 DIAGNOSIS — K5900 Constipation, unspecified: Secondary | ICD-10-CM | POA: Diagnosis not present

## 2016-06-14 DIAGNOSIS — Z79899 Other long term (current) drug therapy: Secondary | ICD-10-CM | POA: Insufficient documentation

## 2016-06-14 DIAGNOSIS — I714 Abdominal aortic aneurysm, without rupture, unspecified: Secondary | ICD-10-CM

## 2016-06-14 DIAGNOSIS — N183 Chronic kidney disease, stage 3 (moderate): Secondary | ICD-10-CM | POA: Insufficient documentation

## 2016-06-14 DIAGNOSIS — R1031 Right lower quadrant pain: Secondary | ICD-10-CM

## 2016-06-14 LAB — COMPREHENSIVE METABOLIC PANEL
ALT: 14 U/L (ref 14–54)
AST: 20 U/L (ref 15–41)
Albumin: 4.2 g/dL (ref 3.5–5.0)
Alkaline Phosphatase: 68 U/L (ref 38–126)
Anion gap: 8 (ref 5–15)
BUN: 9 mg/dL (ref 6–20)
CO2: 30 mmol/L (ref 22–32)
Calcium: 9.4 mg/dL (ref 8.9–10.3)
Chloride: 96 mmol/L — ABNORMAL LOW (ref 101–111)
Creatinine, Ser: 0.91 mg/dL (ref 0.44–1.00)
GFR calc Af Amer: >60 mL/min (ref >60)
GFR calc non Af Amer: 56 mL/min — ABNORMAL LOW (ref >60)
Glucose, Bld: 99 mg/dL (ref 65–99)
Potassium: 3.4 mmol/L — ABNORMAL LOW (ref 3.5–5.1)
Sodium: 134 mmol/L — ABNORMAL LOW (ref 135–145)
Total Bilirubin: 0.5 mg/dL (ref 0.3–1.2)
Total Protein: 7.7 g/dL (ref 6.5–8.1)

## 2016-06-14 LAB — CBC WITH DIFFERENTIAL/PLATELET
Basophils Absolute: 0 10*3/uL (ref 0–0.1)
Basophils Relative: 1 %
Eosinophils Absolute: 0 10*3/uL (ref 0–0.7)
Eosinophils Relative: 1 %
HCT: 42.1 % (ref 35.0–47.0)
Hemoglobin: 13.9 g/dL (ref 12.0–16.0)
Lymphocytes Relative: 28 %
Lymphs Abs: 1.4 10*3/uL (ref 1.0–3.6)
MCH: 25.8 pg — ABNORMAL LOW (ref 26.0–34.0)
MCHC: 33 g/dL (ref 32.0–36.0)
MCV: 78.3 fL — ABNORMAL LOW (ref 80.0–100.0)
Monocytes Absolute: 0.5 10*3/uL (ref 0.2–0.9)
Monocytes Relative: 11 %
Neutro Abs: 2.9 10*3/uL (ref 1.4–6.5)
Neutrophils Relative %: 59 %
Platelets: 188 10*3/uL (ref 150–440)
RBC: 5.37 MIL/uL — ABNORMAL HIGH (ref 3.80–5.20)
RDW: 16.7 % — ABNORMAL HIGH (ref 11.5–14.5)
WBC: 4.9 10*3/uL (ref 3.6–11.0)

## 2016-06-14 LAB — LIPASE, BLOOD: Lipase: 21 U/L (ref 11–51)

## 2016-06-14 LAB — URINALYSIS COMPLETE WITH MICROSCOPIC (ARMC ONLY)
Bacteria, UA: NONE SEEN
Bilirubin Urine: NEGATIVE
Glucose, UA: NEGATIVE mg/dL
Hgb urine dipstick: NEGATIVE
Ketones, ur: NEGATIVE mg/dL
Leukocytes, UA: NEGATIVE
Nitrite: NEGATIVE
Protein, ur: NEGATIVE mg/dL
RBC / HPF: NONE SEEN RBC/hpf (ref 0–5)
Specific Gravity, Urine: 1.003 — ABNORMAL LOW (ref 1.005–1.030)
pH: 7 (ref 5.0–8.0)

## 2016-06-14 MED ORDER — IOPAMIDOL (ISOVUE-300) INJECTION 61%
30.0000 mL | Freq: Once | INTRAVENOUS | Status: AC | PRN
  Administered 2016-06-14: 30 mL via ORAL

## 2016-06-14 MED ORDER — IOPAMIDOL (ISOVUE-300) INJECTION 61%
100.0000 mL | Freq: Once | INTRAVENOUS | Status: AC | PRN
  Administered 2016-06-14: 100 mL via INTRAVENOUS

## 2016-06-14 MED ORDER — MAGNESIUM CITRATE PO SOLN
1.0000 | Freq: Once | ORAL | Status: AC
  Administered 2016-06-14: 1 via ORAL
  Filled 2016-06-14: qty 296

## 2016-06-14 NOTE — Discharge Instructions (Signed)
You were seen in the emergency department today for constipation.  We recommend that you use one or more of the following over-the-counter medications in the order described:   1)  Colace (or Dulcolax) 100 mg:  This is a stool softener, and you may take it once or twice a day as needed. 2)  Miralax (powder):  This medication works by drawing additional fluid into your intestines and helps to flush out your stool.  Mix the powder with water or juice according to label instructions.  It may help if the Colace and Senna are not sufficient, but you must be sure to use the recommended amount of water or juice when you mix up the powder.  Remember that narcotic pain medications are constipating, so avoid them or minimize their use.  Drink plenty of fluids.  Please return to the Emergency Department immediately if you develop new or worsening symptoms that concern you, such as (but not limited to) fever > 101 degrees, severe abdominal pain, or vomiting.  Also, please have Dr. Kathaleen Grinder note to have you set up a follow-up for reevaluation of your abdominal aneurysm in about 2 years.

## 2016-06-14 NOTE — ED Provider Notes (Signed)
[facility] Emergency Department Provider Note   ____________________________________________   First MD Initiated Contact with Patient 06/14/16 [id number]     (approximate)  I have reviewed the triage vital signs and the nursing notes.   HISTORY  Chief Complaint Abdominal Pain    HPI Brittney Simpson is a 82 y.o. female here for evaluation of right lower abdominal pain.  Patient reports for the last 4 days she is experienced discomfort in the right lower abdomen, and she is noticed as particularly tender to touch in the right lower abdomen, pointing roughly at about McBurney's point. She reports she still has her appendix, and she is concerned about that but also reports she is felt constipated and has only been able to pass a small amount of gas but has not had a stool for about 3 days. No nausea or vomiting. At present she does not wish for any pain medicine or nausea medicine, but reports she just like to figure out what's causing her discomfort.   Past Medical History:  Diagnosis Date  . Anxiety   . Back ache   . Hiatal hernia   . Hyperlipidemia   . Hypertension   . Hypothyroid   . Osteopenia   . Thyroid disease     Patient Active Problem List   Diagnosis Date Noted  . Lumbar spondylosis 05/14/2016  . Hypothyroidism 04/23/2016  . HBP (high blood pressure) 10/11/2015  . CKD (chronic kidney disease) stage 3, GFR 30-59 ml/min 10/11/2015  . Skin lesion of scalp 10/11/2015  . Chronic anxiety 10/11/2015  . Insomnia 10/11/2015  . Spondylolisthesis of lumbosacral region (L2-3 and L5-S1) 08/22/2015  . Lumbar spinal stenosis (9 mm at L3-4) 08/22/2015  . Trochanteric bursitis (Right) 08/22/2015  . Chronic hip pain (Right) 08/22/2015  . Osteoarthritis of hip (Right) 08/22/2015  . Chronic sacroiliac joint pain (Right) 08/22/2015  . Chronic lumbar radicular pain (Right) 08/22/2015  . Chronic lower extremity pain (Right) 08/22/2015  . Myofascial pain  08/22/2015  . Encounter for chronic pain management 08/22/2015  . Chronic low back pain (Bilateral) (R>L) 06/03/2015  . Chronic pain 05/24/2015  . Long term current use of opiate analgesic 05/24/2015  . Long term prescription opiate use 05/24/2015  . Opiate use (15 MME/Day) 05/24/2015  . Encounter for therapeutic drug level monitoring 05/24/2015  . Lumbar facet syndrome (Bilateral) (R>L) 05/24/2015    Past Surgical History:  Procedure Laterality Date  . HEMORRHOID SURGERY    . HERNIA REPAIR    . HERNIA REPAIR      Prior to Admission medications   Medication Sig Start Date End Date Taking? Authorizing Provider  amLODipine (NORVASC) 5 MG tablet Take 1 tablet (5 mg total) by mouth daily. 04/23/16   Arlis Porta., MD  brimonidine [facility]) 0.2 % ophthalmic solution  08/24/15   Historical Provider, MD  clonazePAM (KLONOPIN) 0.5 MG tablet TAKE 1/2 TABLET BY MOUTH TWICE DAILY FOR ANXIETY. 04/23/16   Arlis Porta., MD  Ergocalciferol (VITAMIN D2) 2000 UNITS TABS Take 1 capsule by mouth.    Historical Provider, MD  furosemide (LASIX) 20 MG tablet Take 1 tablet (20 mg total) by mouth daily. 04/23/16   Arlis Porta., MD  latanoprost (XALATAN) 0.005 % ophthalmic solution  09/22/15   Historical Provider, MD  levothyroxine (SYNTHROID, LEVOTHROID) 125 MCG tablet TAKE 1 TABLET BY MOUTH ONCE DAILY ON AN EMPTY STOMACH. WAIT 30 MINUTES BEFORE TAKING OTHER MEDS. 04/23/16   Coralie Common  Brooke Bonito., MD  lisinopril (PRINIVIL,ZESTRIL) 40 MG tablet Take 1 tablet (40 mg total) by mouth daily. 04/23/16   Arlis Porta., MD  metoprolol succinate (TOPROL-XL) 100 MG 24 hr tablet TAKE 1 TABLET BY MOUTH TWICE DAILY. 11/02/15   Arlis Porta., MD  omeprazole (PRILOSEC) 20 MG capsule TAKE 1 CAPSULE BY MOUTH ONCE DAILY. 10/19/15   Arlis Porta., MD  sulfamethoxazole-trimethoprim (BACTRIM,SEPTRA) 400-80 MG tablet Take 1 tablet by mouth daily.    Historical Provider, MD  traMADol (ULTRAM) 50 MG tablet  Take 1 tablet (50 mg total) by mouth 3 (three) times daily as needed for severe pain. 06/04/16 12/01/16  Milinda Pointer, MD  traZODone (DESYREL) 50 MG tablet TAKE 1/2 TO 1 TABLET BY MOUTH AT [facility] NEEDED FOR SLEEP 04/23/16   Arlis Porta., MD    Allergies Patient has no known allergies.  Family History  Problem Relation Age of Onset  . Stroke Mother   . Cancer Father     Social History Social History  Substance Use Topics  . Smoking status: Never Smoker  . Smokeless tobacco: Never Used  . Alcohol use No    Review of Systems Constitutional: No fever/chills Eyes: No visual changes. ENT: No sore throat. Cardiovascular: Denies chest pain. Respiratory: Denies shortness of breath. Gastrointestinal: No diarrhea. Feels slightly constipated, but reports passing gas and no pain around the bottom or rectum. Genitourinary: Negative for dysuria. Musculoskeletal: Negative for back pain. Skin: Negative for rash. Neurological: Negative for headaches, focal weakness or numbness.  10-point ROS otherwise negative.  ____________________________________________   PHYSICAL EXAM:  VITAL SIGNS: ED Triage Vitals  Enc Vitals Group     BP 06/14/16 0858 (!) 194/74     Pulse Rate 06/14/16 0858 61     Resp 06/14/16 0858 16     Temp 06/14/16 0858 98.7 F (37.1 C)     Temp Source 06/14/16 0858 Oral     SpO2 06/14/16 0858 96 %     Weight 06/14/16 0859 134 lb (60.8 kg)     Height 06/14/16 0859 5\' 6"  (1.676 m)     Head Circumference --      Peak Flow --      Pain Score --      Pain Loc --      Pain Edu? --      Excl. in [place]? --     Constitutional: Alert and oriented. Well appearing and in no acute distress. Eyes: Conjunctivae are normal. PERRL. EOMI. Head: Atraumatic. Nose: No congestion/rhinnorhea. Mouth/Throat: Mucous membranes are moist.  Oropharynx non-erythematous. Neck: No stridor.   Cardiovascular: Normal rate, regular rhythm. Grossly normal heart sounds.  Good  peripheral circulation. Respiratory: Normal respiratory effort.  No retractions. Lungs CTAB. Gastrointestinal: Soft and nontenderExcept for moderate discomfort in the right mid abdomen without focal pain at McBurney's point. No rebound or guarding. No distention. No abdominal bruits. No CVA tenderness. Musculoskeletal: No lower extremity tenderness nor edema.  No joint effusions. Neurologic:  Normal speech and language. No gross focal neurologic deficits are appreciated. No gait instability. Skin:  Skin is warm, dry and intact. No rash noted. Psychiatric: Mood and affect are normal. Speech and behavior are normal.  ____________________________________________   LABS (all labs ordered are listed, but only abnormal results are displayed)  Labs Reviewed  CBC WITH DIFFERENTIAL/PLATELET - Abnormal; Notable for the following:       Result Value   RBC 5.37 (*)    MCV 78.3 (*)  MCH 25.8 (*)    RDW 16.7 (*)    All other components within normal limits  COMPREHENSIVE METABOLIC PANEL - Abnormal; Notable for the following:    Sodium 134 (*)    Potassium 3.4 (*)    Chloride 96 (*)    GFR calc non Af Amer 56 (*)    All other components within normal limits  URINALYSIS COMPLETEWITH MICROSCOPIC ([facility] ONLY) - Abnormal; Notable for the following:    Color, Urine COLORLESS (*)    APPearance CLEAR (*)    Specific Gravity, Urine 1.003 (*)    Squamous Epithelial / LPF 0-5 (*)    All other components within normal limits  LIPASE, BLOOD   ____________________________________________  EKG   ____________________________________________  RADIOLOGY  Ct Abdomen Pelvis W Contrast  Result Date: 06/14/2016 CLINICAL DATA:  Right lower quadrant pain and constipation for 6 days. EXAM: CT ABDOMEN AND PELVIS WITH CONTRAST TECHNIQUE: Multidetector CT imaging of the abdomen and pelvis was performed using the standard protocol following bolus administration of intravenous contrast. CONTRAST:  160mL  ISOVUE-300 IOPAMIDOL (ISOVUE-300) INJECTION 61% COMPARISON:  CT, 07/08/2014 FINDINGS: Lower chest: No acute finding. Stable 3-4 mm nodule in the left lower lobe. Heart borderline enlarged. Hepatobiliary: 2 low-density liver lesions, 1 adjacent to the falciform ligament measuring 9 mm and the other in the peripheral aspect of the posterior segment of the right lower lobe measuring 8 mm. Both are likely benign cysts in both are stable from the prior CT. No other liver abnormality. There are gallstones without evidence of acute cholecystitis. This is also stable. No bile duct dilation. Pancreas: Unremarkable. No pancreatic ductal dilatation or surrounding inflammatory changes. Spleen: Normal in size without focal abnormality. Adrenals/Urinary Tract: No adrenal masses. Three discrete low-density right renal masses, largest from the medial upper pole measuring 15 mm, all consistent with cysts. Tiny low-density lesion from the upper pole the left kidney is also likely a cyst. No other masses. No renal or ureteral stones. Mild bilateral hydronephrosis with dilation of portions of the proximal ureters. No ureteral stones. Bladder is distended but otherwise unremarkable. Stomach/Bowel: Stomach and small bowel are unremarkable. There is moderate increased stool throughout the colon. No colonic wall thickening or inflammation. Normal appendix visualized. Vascular/Lymphatic: Diffuse abdominal ectasia. Infrarenal abdominal aorta is tortuous. Aorta is dilated to 3.6 cm just below the renal arteries and 2.8 cm just above the bifurcation. Diffuse aortic and iliac artery atherosclerotic plaque. Mural thrombus is noted along the aneurysm below the renal arteries. Findings are relatively stable when compared to the prior exam. No adenopathy. Reproductive: Uterus and bilateral adnexa are unremarkable. Other: No abdominal wall hernia or abnormality. No abdominopelvic ascites. Musculoskeletal: Chronic pars defects at L5-S1 with a grade 2  anterolisthesis with severe bilateral neural foraminal narrowing. Degenerative changes most evident at L2-L3 and L5-S1. Bones are demineralized. No osteoblastic or osteolytic lesions. IMPRESSION: 1. No acute findings within the abdomen or pelvis. 2. Moderate increased stool noted throughout the colon. No colonic obstruction or inflammation. 3. Abdominal aortic aneurysm. Recommend followup by ultrasound in 2 years. This recommendation follows ACR consensus guidelines: White Paper of the ACR Incidental Findings Committee II on Vascular Findings. J Am Coll Radiol 2013; 10:789-794. 4. Low-density renal and liver lesions most likely cysts. 5. 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 This leads to severe neural foraminal narrowing bilaterally. Electronically Signed   By: Lajean Manes M.D.   On: 06/14/2016 12:06    ____________________________________________   PROCEDURES  Procedure(s) performed: None  Procedures  Critical Care performed: No  ____________________________________________   INITIAL IMPRESSION / ASSESSMENT AND PLAN / ED COURSE  Pertinent labs & imaging results that were available during my care of the patient were reviewed by me and considered in my medical decision making (see chart for details).  Differential diagnosis includes but is not limited to, abdominal perforation, aortic dissection, cholecystitis, appendicitis, diverticulitis, colitis, esophagitis/gastritis, kidney stone, pyelonephritis, urinary tract infection, aortic aneurysm. All are considered in decision and treatment plan. Based upon the patient's presentation and risk factors, proceed with CT imaging to further evaluate for etiology of right-sided pain. Symptomatology sounds suspicious for constipation, without clear evidence of bowel obstruction however given the location of her pain will also wish to evaluate for appendicitis however no significant systemic or infectious symptoms are  noted by clinical history, normal white count and afebrile lowering my suspicion significantly.  ----------------------------------------- 12:37 PM on 06/14/2016 -----------------------------------------  Patient is resting comfortably at this time. She reports she feels well, had a large episode of passing gas but no stool yet but reports this relieved her pain and discomfort at this time. Daughter at the bedside, we discussed techniques for improving and treating constipation and close follow-up which are both agreeable. I did discuss careful abdominal pain return precautions for within this well. Discussed a noted aortic aneurysm, and after discussion they understand my recommendation and have this reevaluated in about 2 years time.   Clinical Course      ____________________________________________   FINAL CLINICAL IMPRESSION(S) / ED DIAGNOSES  Final diagnoses:  Constipation, slow transit  Right lower quadrant abdominal pain  Abdominal aortic aneurysm (AAA) without rupture (HCC)      NEW MEDICATIONS STARTED DURING THIS VISIT:  New Prescriptions   No medications on file     Note:  This document was prepared using Dragon voice recognition software and may include unintentional dictation errors.     Delman Kitten, MD 06/14/16 1239

## 2016-06-14 NOTE — ED Triage Notes (Signed)
Pt came to ED via EMS c/o abdominal pain for past 2-3 day. Reports has not had a bowel movement in 4 days. C/o nausea, no vomiting. Not passing much gas. Pt alert and oriented.

## 2016-08-28 DIAGNOSIS — N390 Urinary tract infection, site not specified: Secondary | ICD-10-CM | POA: Diagnosis not present

## 2016-08-28 DIAGNOSIS — I10 Essential (primary) hypertension: Secondary | ICD-10-CM | POA: Diagnosis not present

## 2016-08-28 DIAGNOSIS — E871 Hypo-osmolality and hyponatremia: Secondary | ICD-10-CM | POA: Diagnosis not present

## 2016-08-28 DIAGNOSIS — N183 Chronic kidney disease, stage 3 (moderate): Secondary | ICD-10-CM | POA: Diagnosis not present

## 2016-09-17 DIAGNOSIS — H26492 Other secondary cataract, left eye: Secondary | ICD-10-CM | POA: Diagnosis not present

## 2016-09-17 DIAGNOSIS — H401133 Primary open-angle glaucoma, bilateral, severe stage: Secondary | ICD-10-CM | POA: Diagnosis not present

## 2016-10-23 ENCOUNTER — Ambulatory Visit: Payer: Medicare Other | Admitting: Family Medicine

## 2016-10-25 ENCOUNTER — Encounter: Payer: Self-pay | Admitting: Family Medicine

## 2016-10-25 ENCOUNTER — Ambulatory Visit (INDEPENDENT_AMBULATORY_CARE_PROVIDER_SITE_OTHER): Payer: Medicare Other | Admitting: Family Medicine

## 2016-10-25 VITALS — BP 129/69 | HR 57 | Temp 98.4°F | Resp 16 | Ht 66.0 in | Wt 134.0 lb

## 2016-10-25 DIAGNOSIS — H9113 Presbycusis, bilateral: Secondary | ICD-10-CM | POA: Insufficient documentation

## 2016-10-25 DIAGNOSIS — K219 Gastro-esophageal reflux disease without esophagitis: Secondary | ICD-10-CM | POA: Diagnosis not present

## 2016-10-25 DIAGNOSIS — I10 Essential (primary) hypertension: Secondary | ICD-10-CM

## 2016-10-25 DIAGNOSIS — F5101 Primary insomnia: Secondary | ICD-10-CM

## 2016-10-25 DIAGNOSIS — N390 Urinary tract infection, site not specified: Secondary | ICD-10-CM | POA: Insufficient documentation

## 2016-10-25 DIAGNOSIS — N183 Chronic kidney disease, stage 3 unspecified: Secondary | ICD-10-CM

## 2016-10-25 DIAGNOSIS — F419 Anxiety disorder, unspecified: Secondary | ICD-10-CM | POA: Diagnosis not present

## 2016-10-25 MED ORDER — CIPROFLOXACIN HCL 500 MG PO TABS: 500.0000 mg | ORAL_TABLET | Freq: Two times a day (BID) | ORAL | 2 refills | Status: DC

## 2016-10-25 MED ORDER — TRAZODONE HCL 50 MG PO TABS: 50.0000 mg | ORAL_TABLET | Freq: Every day | ORAL | 11 refills | Status: DC

## 2016-10-25 MED ORDER — CLONAZEPAM 0.5 MG PO TABS: ORAL_TABLET | ORAL | 5 refills | Status: DC

## 2016-10-25 MED ORDER — OMEPRAZOLE 20 MG PO CPDR: 20.0000 mg | DELAYED_RELEASE_CAPSULE | Freq: Every day | ORAL | 11 refills | Status: DC

## 2016-10-25 NOTE — Patient Instructions (Signed)
Thank you for coming in to clinic today.  1. For chronic recurrent UTI - We prescribed Cipro antibiotic (plus refills x 2), only take this if you start to develop acute symptoms of Urinary Tract Infection with burning with urination, discomfort, urinary frequency, back pain - if you develop symptoms and need to take this antibiotic, then please notify our office, and we may ask that you leave a urine sample for a urine culture  If symptoms worsening, developing nausea / vomiting, worsening back pain, fevers / chills / sweats, then please return for re-evaluation sooner.  ------- 2. Continue Bactrim for prevention of UTI - discuss with Dr Juleen China about changing this medicine in future if concerned it is not working as well  3. Refilled Trazodone today - given 11 refills for year supply, take ONE nightly  4. Refilled Clonazepm - +5 refills, only turn in rx when ready for refill  Please ask Dr Juleen China if they would also draw labs for Hemoglobin A1c (diabetes screening) and/or Cholesterol (If needed only)  Please schedule a follow-up appointment with [name] in 6 months for Annual Physical  If you have any other questions or concerns, please feel free to call the clinic or send a message through [place]. You may also schedule an earlier appointment if necessary.  Nobie Putnam, DO [place]

## 2016-10-25 NOTE — Progress Notes (Signed)
Subjective:    Patient ID: Brittney Simpson, female    DOB: [birth year], 83 y.o.   MRN: [id number]  Brittney Simpson is a 83 y.o. female presenting on 10/25/2016 for Hypertension  Patient accompanied by daughter (and primary caregiver) [name], who provides majority of history and able to help facilitate history from her mother, limited by hearing.  Other daughter Gena Fray is not in attendance today but also goes to her doctors visits sometimes.  HPI   Specialist: Nephrology - Dr Lavonia Dana [facility])  Here today for a routine 6 month follow-up. Reported that she goes to PCP q 6 months and to Nephrology q 6 months alternating.  CHRONIC HTN: Reports checks BP occasionally at home, ranging 110-120s up 130-170s, also HR will fluctuate Current Meds - Amlodipine 5mg  daily, Lisinopril 40mg  daily, Metoprolol XL 100mg  daily, Lasix 20mg  daily   Reports good compliance, took meds today. Tolerating well, w/o complaints. Denies CP, dyspnea, HA, edema, dizziness / lightheadedness  CKD-III, secondary to HTN Chronic problem without any recent concerns, continues to be followed by Nephrology (Dr Juleen China) every 6 months, she obtains all blood work through their office by preference. - Currently doing well, no new concerns today  H/o Recurrent UTI Prior history of UTI recurrently, she is being treated by Nephrology with chronic antibiotic prophylaxis with Bactrim (400-80mg  daily, single strength) several years now, preventatively, occasional in past with UTI breakthrough requires Cipro PRN. Describes difficulty with occasional UTI often on weekend or difficulty getting into office and asking for rx Cipro on hand to use promptly if start symptoms, she had done this before and done well on it. - Currently asymptomatic. Last UTI 3 months ago. Her typical UTI symptoms are low back pain symptoms, dysuria, frequency, also will usually get some confusion and cognitive  symptoms.  ANXIETY / INSOMNIA Reports chronic anxiety and some related difficulty with sleeping and insomnia, limited details provided on long course of these conditions, but overall has remained very stable on current regimen, doing well. - Requests refill Clonazepam 0.5mg  tabs, takes half tab most days, occasionally few times a month will take half tab BID, extremely rare dose to take whole tablet, often the rx will last her longer, she has 1 refill left but will keep printed rx on hand by request. Denies any problems with withdrawal from BDZ, aware of risks of sedation and potential fall risk at age - [facility]quest refill on Trazodone 50mg , written for half to whole tab nightly, she has consistently been taking 1 whole tab with good results, does not have side effects or drowsiness after  H/o Elevated Cholesterol / Abnormal Glucose - Reports history of having chronically "abnormal cholesterol" and in past she was on cholesterol medication but stated it was never fully resolved, and more recently she was decided to stop the cholesterol med and not check the labs, has done well. Does not have recent lipid panel result - Has had several abnormal glucose results, but no A1c. Not aware of last screening for Pre-DM or DM, does not tend to run in family  Presbycusis Bilateral / Poor Hearing - Reports chronic gradual worsening hearing over years, she admits would need hearing aid but unable to afford since not covered by medicare. She does better with her daughter's voice, if looking at your face, non verbal gestures as well. - Denies headache, tinnitus, ear pain  GERD Reports doing well on Omeprazole PPI for symptom control. Request refill today.  Additional  history: - Daughter, [name] helps her regularly at home, arranges pill box - She eats 3 regular meals a day, often drinks tea, some water, good appetite - No recent falls or injury, ambulatory   Social History  Substance Use Topics  . Smoking  status: Never Smoker  . Smokeless tobacco: Never Used  . Alcohol use No    Review of Systems Per HPI unless specifically indicated above     Objective:    BP 129/69   Pulse (!) 57   Temp 98.4 F (36.9 C) (Oral)   Resp 16   Ht 5\' 6"  (1.676 m)   Wt 134 lb (60.8 kg)   BMI 21.63 kg/m   Wt Readings from Last 3 Encounters:  10/25/16 134 lb (60.8 kg)  06/14/16 134 lb (60.8 kg)  06/04/16 134 lb (60.8 kg)    Physical Exam  Constitutional: She is oriented to person, place, and time. She appears well-developed and well-nourished. No distress.  Elderly 83 year old female, currently well-appearing, comfortable, cooperative  HENT:  Head: Normocephalic and atraumatic.  Mouth/Throat: Oropharynx is clear and moist.  Dentures in place  Poor hearing, limited conversational (understands her daughter better), does not own hearing aids  Eyes: Conjunctivae are normal.  Neck: Normal range of motion. Neck supple. No thyromegaly present.  Cardiovascular: Normal rate, regular rhythm, normal heart sounds and intact distal pulses.   No murmur heard. Pulmonary/Chest: Effort normal and breath sounds normal. No respiratory distress. She has no wheezes. She has no rales.  Musculoskeletal: She exhibits edema (trace bilateral ankle only, non pitting, not extending up legs, symmetrical, non tender).  Lymphadenopathy:    She has no cervical adenopathy.  Neurological: She is alert and oriented to person, place, and time.  Skin: Skin is warm and dry. No rash noted. She is not diaphoretic. No erythema.  Psychiatric: She has a normal mood and affect. Her behavior is normal.  Well groomed, good eye contact, normal speech and thoughts. Appropriate verbal responses when understands question due to limited hearing, understands her daughter better. Follows all commands.  Nursing note and vitals reviewed.    I have personally reviewed the following lab results from 06/14/16.  Results for orders placed or  performed during the hospital encounter of 06/14/16  CBC with Differential  Result Value Ref Range   WBC 4.9 3.6 - 11.0 K/uL   RBC 5.37 (H) 3.80 - 5.20 MIL/uL   Hemoglobin 13.9 12.0 - 16.0 g/dL   HCT 42.1 35.0 - 47.0 %   MCV 78.3 (L) 80.0 - 100.0 fL   MCH 25.8 (L) 26.0 - 34.0 pg   MCHC 33.0 32.0 - 36.0 g/dL   RDW 16.7 (H) 11.5 - 14.5 %   Platelets 188 150 - 440 K/uL   Neutrophils Relative % 59 %   Neutro Abs 2.9 1.4 - 6.5 K/uL   Lymphocytes Relative 28 %   Lymphs Abs 1.4 1.0 - 3.6 K/uL   Monocytes Relative 11 %   Monocytes Absolute 0.5 0.2 - 0.9 K/uL   Eosinophils Relative 1 %   Eosinophils Absolute 0.0 0 - 0.7 K/uL   Basophils Relative 1 %   Basophils Absolute 0.0 0 - 0.1 K/uL  Comprehensive metabolic panel  Result Value Ref Range   Sodium 134 (L) 135 - 145 mmol/L   Potassium 3.4 (L) 3.5 - 5.1 mmol/L   Chloride 96 (L) 101 - 111 mmol/L   CO2 30 22 - 32 mmol/L   Glucose, Bld 99 65 -  99 mg/dL   BUN 9 6 - 20 mg/dL   Creatinine, Ser 0.91 0.44 - 1.00 mg/dL   Calcium 9.4 8.9 - 10.3 mg/dL   Total Protein 7.7 6.5 - 8.1 g/dL   Albumin 4.2 3.5 - 5.0 g/dL   AST 20 15 - 41 U/L   ALT 14 14 - 54 U/L   Alkaline Phosphatase 68 38 - 126 U/L   Total Bilirubin 0.5 0.3 - 1.2 mg/dL   GFR calc non Af Amer 56 (L) >60 mL/min   GFR calc Af Amer >60 >60 mL/min   Anion gap 8 5 - 15  Lipase, blood  Result Value Ref Range   Lipase 21 11 - 51 U/L  Urinalysis complete, with microscopic ([facility] only)  Result Value Ref Range   Color, Urine COLORLESS (A) YELLOW   APPearance CLEAR (A) CLEAR   Glucose, UA NEGATIVE NEGATIVE mg/dL   Bilirubin Urine NEGATIVE NEGATIVE   Ketones, ur NEGATIVE NEGATIVE mg/dL   Specific Gravity, Urine 1.003 (L) 1.005 - 1.030   Hgb urine dipstick NEGATIVE NEGATIVE   pH 7.0 5.0 - 8.0   Protein, ur NEGATIVE NEGATIVE mg/dL   Nitrite NEGATIVE NEGATIVE   Leukocytes, UA NEGATIVE NEGATIVE   RBC / HPF NONE SEEN 0 - 5 RBC/hpf   WBC, UA 0-5 0 - 5 WBC/hpf   Bacteria, UA NONE SEEN  NONE SEEN   Squamous Epithelial / LPF 0-5 (A) NONE SEEN      Assessment & Plan:   Problem List Items Addressed This Visit    Recurrent UTI    Stable now without recent UTI, asymptomatic currently, managed on antibiotic prophylaxis with bactrim single dose daily for few years by nephrology - Last UTI 3 months ago, often has typical symptoms and confusion - Usually treated by Cipro  Plan: 1. By patient request - agreed to provide printed rx Cipro 500mg  BID for 5 days +2 refills to use PRN in future only if develops notable UTI symptoms, discussed risks of not having prompt UA/Urine Culture and and antibiotic may skew results, however given difficulty in past making it to appointment at times if has acute illness, risk outweigh benefit to start treatment earlier, advised if needs to take it should notify office and present as soon as possible to check urine culture regardless, depending on how many UTI she gets per year can re-evaluate      Relevant Medications   ciprofloxacin (CIPRO) 500 MG tablet   Presbycusis of both ears    Stable chronic bilateral hearing loss, suspected to be due to age - Currently remains functional with assistance of her daughter - Would benefit from hearing aid but cannot afford, not covered medicare - Follow-up as needed      Insomnia    Stable, chronic underlying problem likely related to variety factors age altered sleep cycle, anxiety, chronic pain  Plan: 1. Refilled Trazodone continue 50mg  whole tab nightly      Relevant Medications   traZODone (DESYREL) 50 MG tablet   GERD (gastroesophageal reflux disease)    Controlled on PPI Refilled Omeprazole 20mg  daily - aware of risks, side effects, prefers to continue treatment for symptom control      Relevant Medications   omeprazole (PRILOSEC) 20 MG capsule   Essential hypertension    Well controlled currently Complicated by CKD-III  Plan: 1. Continue current regimen - Amlodipine 5mg  daily,  Lisinopril 40mg  daily, Metoprolol XL 100mg  daily, Lasix 20mg  daily - often some meds rx by Nephrology  2. Encourage to stay active, low sodium diet, improve hydration with water (drinks a lot of tea) 3. Follow-up as needed, plan q 6 months      CKD (chronic kidney disease) stage 3, GFR 30-59 ml/min    Stable CKD-III, recent Cr >0.9 with GFR < 60, suspected secondary to chronic HTN Followed by Nephrology Dr Juleen China q 6 months, and lab monitoring - Continue current regimen anti-HTN meds, diuretic lasix 20mg  daily - Follow-up as needed      Chronic anxiety - Primary    Stable, chronic anxiety, with associated insomnia Chronically on BDZ without history of abuse or dose increase Checked [place] CSRS for past 1 year, with stable routine refills on rx of Clonazepam only from PCP and Tramadol for chronic pain OA only from Pain Management, no red flags  Plan: 1. Discussed chronic BDZ use for anxiety, and not optimal management routinely also concerns with age >58, now at 42, high risk sedation, fall, confusion, withdrawal risks - she is aware of most of these, and med tolerated very well, she is on half tab usually once daily, aware not to increase dose. At this point agree for continuing BDZ since they are aware of risks and stable on it, almost due for refill but since in office will go ahead and print refill Clonazepam 0.5mg  tabs take HALF BID, #30, +5 refills for >6 month  Supply, only fill when need 2. Follow-up q 6 months      Relevant Medications   clonazePAM (KLONOPIN) 0.5 MG tablet   traZODone (DESYREL) 50 MG tablet      Meds ordered this encounter       . ciprofloxacin (CIPRO) 500 MG tablet    Sig: Take 1 tablet (500 mg total) by mouth 2 (two) times daily.    Dispense:  10 tablet    Refill:  2  . clonazePAM (KLONOPIN) 0.5 MG tablet    Sig: TAKE 1/2 TABLET BY MOUTH TWICE DAILY FOR ANXIETY.    Dispense:  30 tablet    Refill:  5  . omeprazole (PRILOSEC) 20 MG capsule    Sig: Take 1  capsule (20 mg total) by mouth daily.    Dispense:  30 capsule    Refill:  11  . traZODone (DESYREL) 50 MG tablet    Sig: Take 1 tablet (50 mg total) by mouth at bedtime.    Dispense:  30 tablet    Refill:  11      Follow up plan: Return in about 6 months (around 04/27/2017) for Annual Physical.  Nobie Putnam, DO [facility] 10/26/2016, 7:51 AM

## 2016-10-26 NOTE — Assessment & Plan Note (Signed)
Stable now without recent UTI, asymptomatic currently, managed on antibiotic prophylaxis with bactrim single dose daily for few years by nephrology - Last UTI 3 months ago, often has typical symptoms and confusion - Usually treated by Cipro  Plan: 1. By patient request - agreed to provide printed rx Cipro 500mg  BID for 5 days +2 refills to use PRN in future only if develops notable UTI symptoms, discussed risks of not having prompt UA/Urine Culture and and antibiotic may skew results, however given difficulty in past making it to appointment at times if has acute illness, risk outweigh benefit to start treatment earlier, advised if needs to take it should notify office and present as soon as possible to check urine culture regardless, depending on how many UTI she gets per year can re-evaluate

## 2016-10-26 NOTE — Assessment & Plan Note (Signed)
Stable, chronic anxiety, with associated insomnia Chronically on BDZ without history of abuse or dose increase Checked [place] CSRS for past 1 year, with stable routine refills on rx of Clonazepam only from PCP and Tramadol for chronic pain OA only from Pain Management, no red flags  Plan: 1. Discussed chronic BDZ use for anxiety, and not optimal management routinely also concerns with age >42, now at 36, high risk sedation, fall, confusion, withdrawal risks - she is aware of most of these, and med tolerated very well, she is on half tab usually once daily, aware not to increase dose. At this point agree for continuing BDZ since they are aware of risks and stable on it, almost due for refill but since in office will go ahead and print refill Clonazepam 0.5mg  tabs take HALF BID, #30, +5 refills for >6 month  Supply, only fill when need 2. Follow-up q 6 months

## 2016-10-26 NOTE — Assessment & Plan Note (Signed)
Stable, chronic underlying problem likely related to variety factors age altered sleep cycle, anxiety, chronic pain  Plan: 1. Refilled Trazodone continue 50mg  whole tab nightly

## 2016-10-26 NOTE — Assessment & Plan Note (Signed)
Controlled on PPI Refilled Omeprazole 20mg  daily - aware of risks, side effects, prefers to continue treatment for symptom control

## 2016-10-26 NOTE — Assessment & Plan Note (Signed)
Stable CKD-III, recent Cr >0.9 with GFR < 60, suspected secondary to chronic HTN Followed by Nephrology Dr Juleen China q 6 months, and lab monitoring - Continue current regimen anti-HTN meds, diuretic lasix 20mg  daily - Follow-up as needed

## 2016-10-26 NOTE — Assessment & Plan Note (Addendum)
Stable chronic bilateral hearing loss, suspected to be due to age - Currently remains functional with assistance of her daughter - Would benefit from hearing aid but cannot afford, not covered medicare - Follow-up as needed

## 2016-10-26 NOTE — Assessment & Plan Note (Signed)
Well controlled currently Complicated by CKD-III  Plan: 1. Continue current regimen - Amlodipine 5mg  daily, Lisinopril 40mg  daily, Metoprolol XL 100mg  daily, Lasix 20mg  daily - often some meds rx by Nephrology 2. Encourage to stay active, low sodium diet, improve hydration with water (drinks a lot of tea) 3. Follow-up as needed, plan q 6 months

## 2016-11-05 ENCOUNTER — Encounter: Payer: Medicare Other | Admitting: Pain Medicine

## 2016-11-06 ENCOUNTER — Ambulatory Visit (HOSPITAL_BASED_OUTPATIENT_CLINIC_OR_DEPARTMENT_OTHER): Payer: Medicare Other | Admitting: Pain Medicine

## 2016-11-06 ENCOUNTER — Ambulatory Visit
Admission: RE | Admit: 2016-11-06 | Discharge: 2016-11-06 | Disposition: A | Discharge status: Home / Self Care | Payer: Medicare Other | Source: Ambulatory Visit | Attending: Pain Medicine | Admitting: Pain Medicine

## 2016-11-06 ENCOUNTER — Encounter: Payer: Self-pay | Admitting: Pain Medicine

## 2016-11-06 VITALS — BP 187/106 | HR 64 | Temp 98.3°F | Resp 14 | Ht 66.0 in | Wt 129.0 lb

## 2016-11-06 DIAGNOSIS — M545 Low back pain: Secondary | ICD-10-CM | POA: Insufficient documentation

## 2016-11-06 DIAGNOSIS — M1288 Other specific arthropathies, not elsewhere classified, other specified site: Secondary | ICD-10-CM

## 2016-11-06 DIAGNOSIS — G8929 Other chronic pain: Secondary | ICD-10-CM

## 2016-11-06 DIAGNOSIS — M488X6 Other specified spondylopathies, lumbar region: Secondary | ICD-10-CM | POA: Diagnosis not present

## 2016-11-06 DIAGNOSIS — M47816 Spondylosis without myelopathy or radiculopathy, lumbar region: Secondary | ICD-10-CM

## 2016-11-06 MED ORDER — TRIAMCINOLONE ACETONIDE 40 MG/ML IJ SUSP
40.0000 mg | Freq: Once | INTRAMUSCULAR | Status: AC
  Administered 2016-11-06: 40 mg

## 2016-11-06 MED ORDER — ROPIVACAINE HCL 2 MG/ML IJ SOLN
INTRAMUSCULAR | Status: AC
  Filled 2016-11-06: qty 20

## 2016-11-06 MED ORDER — ROPIVACAINE HCL 2 MG/ML IJ SOLN
9.0000 mL | Freq: Once | INTRAMUSCULAR | Status: AC
  Administered 2016-11-06: 9 mL via PERINEURAL

## 2016-11-06 MED ORDER — LIDOCAINE HCL (PF) 1 % IJ SOLN: 10.0000 mL | Freq: Once | INTRAMUSCULAR | Status: DC

## 2016-11-06 MED ORDER — TRIAMCINOLONE ACETONIDE 40 MG/ML IJ SUSP
INTRAMUSCULAR | Status: AC
  Filled 2016-11-06: qty 2

## 2016-11-06 NOTE — Progress Notes (Signed)
Safety precautions to be maintained throughout the outpatient stay will include: orient to surroundings, keep bed in low position, maintain call bell within reach at all times, provide assistance with transfer out of bed and ambulation.  

## 2016-11-06 NOTE — Patient Instructions (Addendum)
Post-Procedure instructions Instructions:  Apply ice: Fill a plastic sandwich bag with crushed ice. Cover it with a small towel and apply to injection site. Apply for 15 minutes then remove x 15 minutes. Repeat sequence on day of procedure, until you go to bed. The purpose is to minimize swelling and discomfort after procedure.  Apply heat: Apply heat to procedure site starting the day following the procedure. The purpose is to treat any soreness and discomfort from the procedure.  Food intake: Start with clear liquids (like water) and advance to regular food, as tolerated.   Physical activities: Keep activities to a minimum for the first 8 hours after the procedure.   Driving: If you have received any sedation, you are not allowed to drive for 24 hours after your procedure.  Blood thinner: Restart your blood thinner 6 hours after your procedure. (Only for those taking blood thinners)  Insulin: As soon as you can eat, you may resume your normal dosing schedule. (Only for those taking insulin)  Infection prevention: Keep procedure site clean and dry.  Post-procedure Pain Diary: Extremely important that this be done correctly and accurately. Recorded information will be used to determine the next step in treatment.  Pain evaluated is that of treated area only. Do not include pain from an untreated area.  Complete every hour, on the hour, for the initial 8 hours. Set an alarm to help you do this part accurately.  Do not go to sleep and have it completed later. It will not be accurate.  Follow-up appointment: Keep your follow-up appointment after the procedure. Usually 2 weeks for most procedures. (6 weeks in the case of radiofrequency.) Bring you pain diary.  Expect:  From numbing medicine (AKA: Local Anesthetics): Numbness or decrease in pain.  Onset: Full effect within 15 minutes of injected.  Duration: It will depend on the type of local anesthetic used. On the average, 1 to 8  hours.   From steroids: Decrease in swelling or inflammation. Once inflammation is improved, relief of the pain will follow.  Onset of benefits: Depends on the amount of swelling present. The more swelling, the longer it will take for the benefits to be seen.   Duration: Steroids will stay in the system x 2 weeks. Duration of benefits will depend on multiple posibilities including persistent irritating factors.  From procedure: Some discomfort is to be expected once the numbing medicine wears off. This should be minimal if ice and heat are applied as instructed. Call if:  You experience numbness and weakness that gets worse with time, as opposed to wearing off.  New onset bowel or bladder incontinence. (Spinal procedures only)  Emergency Numbers:  Durning business hours (Monday - Thursday, 8:00 AM - 4:00 PM) (Friday, 9:00 AM - 12:00 Noon): [phone]  After hours: [phone]   __________________________________________________________________________________________   Pain Score  Introduction: The pain score used by this practice is the Verbal Numerical Rating Scale (VNRS-11). This is an 11-point scale. It is for adults and children 10 years or older. There are significant differences in how the pain score is reported, used, and applied. Forget everything you learned in the past and learn this scoring system.  General Information: The scale should reflect your current level of pain. Unless you are specifically asked for the level of your worst pain, or your average pain. If you are asked for one of these two, then it should be understood that it is over the past 24 hours.  Basic Activities  of Daily Living (ADL): Personal hygiene, dressing, eating, transferring, and using restroom.  Instructions: Most patients tend to report their level of pain as a combination of two factors, their physical pain and their psychosocial pain. This last one is also known as "suffering" and it is  reflection of how physical pain affects you socially and psychologically. From now on, report them separately. From this point on, when asked to report your pain level, report only your physical pain. Use the following table for reference.  Pain Clinic Pain Levels (0-5/10)  Pain Level Score Description  No Pain 0   Mild pain 1 Nagging, annoying, but does not interfere with basic activities of daily living (ADL). Patients are able to eat, bathe, get dressed, toileting (being able to get on and off the toilet and perform personal hygiene functions), transfer (move in and out of bed or a chair without assistance), and maintain continence (able to control bladder and bowel functions). Blood pressure and heart rate are unaffected. A normal heart rate for a healthy adult ranges from 60 to 100 bpm (beats per minute).   Mild to moderate pain 2 Noticeable and distracting. Impossible to hide from other people. More frequent flare-ups. Still possible to adapt and function close to normal. It can be very annoying and may have occasional stronger flare-ups. With discipline, patients may get used to it and adapt.   Moderate pain 3 Interferes significantly with activities of daily living (ADL). It becomes difficult to feed, bathe, get dressed, get on and off the toilet or to perform personal hygiene functions. Difficult to get in and out of bed or a chair without assistance. Very distracting. With effort, it can be ignored when deeply involved in activities.   Moderately severe pain 4 Impossible to ignore for more than a few minutes. With effort, patients may still be able to manage work or participate in some social activities. Very difficult to concentrate. Signs of autonomic nervous system discharge are evident: dilated pupils (mydriasis); mild sweating (diaphoresis); sleep interference. Heart rate becomes elevated (>115 bpm). Diastolic blood pressure (lower number) rises above 100 mmHg. Patients find relief in  laying down and not moving.   Severe pain 5 Intense and extremely unpleasant. Associated with frowning face and frequent crying. Pain overwhelms the senses.  Ability to do any activity or maintain social relationships becomes significantly limited. Conversation becomes difficult. Pacing back and forth is common, as getting into a comfortable position is nearly impossible. Pain wakes you up from deep sleep. Physical signs will be obvious: pupillary dilation; increased sweating; goosebumps; brisk reflexes; cold, clammy hands and feet; nausea, vomiting or dry heaves; loss of appetite; significant sleep disturbance with inability to fall asleep or to remain asleep. When persistent, significant weight loss is observed due to the complete loss of appetite and sleep deprivation.  Blood pressure and heart rate becomes significantly elevated. Caution: If elevated blood pressure triggers a pounding headache associated with blurred vision, then the patient should immediately seek attention at an urgent or emergency care unit, as these may be signs of an impending stroke.    Emergency Department Pain Levels (6-10/10)  Emergency Room Pain 6 Severely limiting. Requires emergency care and should not be seen or managed at an outpatient pain management facility. Communication becomes difficult and requires great effort. Assistance to reach the emergency department may be required. Facial flushing and profuse sweating along with potentially dangerous increases in heart rate and blood pressure will be evident.   Distressing pain 7   Self-care is very difficult. Assistance is required to transport, or use restroom. Assistance to reach the emergency department will be required. Tasks requiring coordination, such as bathing and getting dressed become very difficult.   Disabling pain 8 Self-care is no longer possible. At this level, pain is disabling. The individual is unable to do even the most "basic" activities such as  walking, eating, bathing, dressing, transferring to a bed, or toileting. Fine motor skills are lost. It is difficult to think clearly.   Incapacitating pain 9 Pain becomes incapacitating. Thought processing is no longer possible. Difficult to remember your own name. Control of movement and coordination are lost.   The worst pain imaginable 10 At this level, most patients pass out from pain. When this level is reached, collapse of the autonomic nervous system occurs, leading to a sudden drop in blood pressure and heart rate. This in turn results in a temporary and dramatic drop in blood flow to the brain, leading to a loss of consciousness. Fainting is one of the body's self defense mechanisms. Passing out puts the brain in a calmed state and causes it to shut down for a while, in order to begin the healing process.    Summary: 1. Refer to this scale when providing [place] with your pain level. 2. Be accurate and careful when reporting your pain level. This will help with your care. 3. Over-reporting your pain level will lead to loss of credibility. 4. Even a level of 1/10 means that there is pain and will be treated at our facility. 5. High, inaccurate reporting will be documented as "Symptom Exaggeration", leading to loss of credibility and suspicions of possible secondary gains such as obtaining more narcotics, or wanting to appear disabled, for fraudulent reasons. 6. Only pain levels of 5 or below will be seen at our facility. 7. Pain levels of 6 and above will be sent to the Emergency Department and the appointment cancelled. _____________________________________________________________________________________________  Pain Management Discharge Instructions  General Discharge Instructions :  If you need to reach your doctor call: Monday-Friday 8:00 am - 4:00 pm at [phone] or toll free [phone].  After clinic hours [phone] to have operator reach doctor.  Bring all of your  medication bottles to all your appointments in the pain clinic.  To cancel or reschedule your appointment with Pain Management please remember to call 24 hours in advance to avoid a fee.  Refer to the educational materials which you have been given on: General Risks, I had my Procedure. Discharge Instructions, Post Sedation.  Post Procedure Instructions:  The drugs you were given will stay in your system until tomorrow, so for the next 24 hours you should not drive, make any legal decisions or drink any alcoholic beverages.  You may eat anything you prefer, but it is better to start with liquids then soups and crackers, and gradually work up to solid foods.  Please notify your doctor immediately if you have any unusual bleeding, trouble breathing or pain that is not related to your normal pain.  Depending on the type of procedure that was done, some parts of your body may feel week and/or numb.  This usually clears up by tonight or the next day.  Walk with the use of an assistive device or accompanied by an adult for the 24 hours.  You may use ice on the affected area for the first 24 hours.  Put ice in a Ziploc bag and cover with a towel and place against area  15 minutes on 15 minutes off.  You may switch to heat after 24 hours. Facet Joint Block, Care After Refer to this sheet in the next few weeks. These instructions provide you with information about caring for yourself after your procedure. Your health care provider may also give you more specific instructions. Your treatment has been planned according to current medical practices, but problems sometimes occur. Call your health care provider if you have any problems or questions after your procedure. What can I expect after the procedure? After the procedure, it is common to have:  Some tenderness over the injection sites for 2 days after the procedure.  A temporary increase in blood sugar if you have diabetes. Follow these  instructions at home:  Keep track of the amount of pain relief you feel and how long it lasts.  Take over-the-counter and prescription medicines only as told by your health care provider. You may need to limit pain medicine within the first 4-6 hours after the procedure.  Remove your bandages (dressings) the morning after the procedure.  For the first 24 hours after the procedure:  Do not apply heat near or over the injection sites.  Do not take a bath or soak in water, such as in a pool or lake.  Do not drive or operate heavy machinery unless approved by your health care provider.  Avoid activities that require a lot of energy.  If the injection site is tender, try applying ice to the area. To do this:  Put ice in a plastic bag.  Place a towel between your skin and the bag.  Leave the ice on for 20 minutes, 2-3 times a day.  Keep all follow-up visits as told by your health care provider. This is important. Contact a health care provider if:  Fluid is coming from an injection site.  There is significant bleeding or swelling at an injection site.  You have diabetes and your blood sugar is above 180 mg/dL. Get help right away if:  You have a fever.  You have worsening pain or swelling around an injection site.  There are red streaks around an injection site.  You develop severe pain that is not controlled by your medicines.  You develop a headache, stiff neck, nausea, or vomiting.  Your eyes become very sensitive to light.  You have weakness, paralysis, or tingling in your arms or legs that was not present before the procedure.  You have difficulty urinating or breathing. This information is not intended to replace advice given to you by your health care provider. Make sure you discuss any questions you have with your health care provider. Document Released: 07/01/2012 Document Revised: 11/29/2015 Document Reviewed: 04/10/2015 Elsevier Interactive Patient Education   2017 [employer]  Facet Joint Block The facet joints connect the bones of the spine (vertebrae). They make it possible for you to bend, twist, and make other movements with your spine. They also keep you from bending too far, twisting too far, and making other excessive movements. A facet joint block is a procedure where a numbing medicine (anesthetic) is injected into a facet joint. Often, a type of anti-inflammatory medicine called a steroid is also injected. A facet joint block may be done to diagnose neck or back pain. If the pain gets better after a facet joint block, it means the pain is probably coming from the facet joint. If the pain does not get better, it means the pain is probably not coming from the  facet joint. A facet joint block may also be done to relieve neck or back pain caused by an inflamed facet joint. A facet joint block is only done to relieve pain if the pain does not improve with other methods, such as medicine, exercise programs, and physical therapy. Tell a health care provider about:  Any allergies you have.  All medicines you are taking, including vitamins, herbs, eye drops, creams, and over-the-counter medicines.  Any problems you or family members have had with anesthetic medicines.  Any blood disorders you have.  Any surgeries you have had.  Any medical conditions you have.  Whether you are pregnant or may be pregnant. What are the risks? Generally, this is a safe procedure. However, problems may occur, including:  Bleeding.  Injury to a nerve near the injection site.  Pain at the injection site.  Weakness or numbness in areas controlled by nerves near the injection site.  Infection.  Temporary fluid retention.  Allergic reactions to medicines or dyes.  Injury to other structures or organs near the injection site. What happens before the procedure?  Follow instructions from your health care provider about eating or drinking  restrictions.  Ask your health care provider about:  Changing or stopping your regular medicines. This is especially important if you are taking diabetes medicines or blood thinners.  Taking medicines such as aspirin and ibuprofen. These medicines can thin your blood. Do not take these medicines before your procedure if your health care provider instructs you not to.  Do not take any new dietary supplements or medicines without asking your health care provider first.  Plan to have someone take you home after the procedure. What happens during the procedure?  You may need to remove your clothing and dress in an open-back gown.  The procedure will be done while you are lying on an X-ray table. You will most likely be asked to lie on your stomach, but you may be asked to lie in a different position if an injection will be made in your neck.  Machines will be used to monitor your oxygen levels, heart rate, and blood pressure.  If an injection will be made in your neck, an IV tube will be inserted into one of your veins. Fluids and medicine will flow directly into your body through the IV tube.  The area over the facet joint where the injection will be made will be cleaned with soap. The surrounding skin will be covered with clean drapes.  A numbing medicine (local anesthetic) will be applied to your skin. Your skin may sting or burn for a moment.  A video X-ray machine (fluoroscopy) will be used to locate the joint. In some cases, a CT scan may be used.  A contrast dye may be injected into the facet joint area to help locate the joint.  When the joint is located, an anesthetic will be injected into the joint through the needle.  Your health care provider will ask you whether you feel pain relief. If you do feel relief, a steroid may be injected to provide pain relief for a longer period of time. If you do not feel relief or feel only partial relief, additional injections of an anesthetic  may be made in other facet joints.  The needle will be removed.  Your skin will be cleaned.  A bandage (dressing) will be applied over each injection site. The procedure may vary among health care providers and hospitals. What happens after the procedure?  You will be observed for 15-30 minutes before being allowed to go home. This information is not intended to replace advice given to you by your health care provider. Make sure you discuss any questions you have with your health care provider. Document Released: 12/04/2006 Document Revised: 08/16/2015 Document Reviewed: 04/10/2015 Elsevier Interactive Patient Education  2017 [employer].

## 2016-11-06 NOTE — Progress Notes (Signed)
Patient's Name: Brittney Simpson  MRN: [id number]  Referring Provider: Nobie Putnam *  DOB: 1927-12-18  PCP: Olin Hauser, DO  DOS: 11/06/2016  Note by: Kathlen Brunswick. Dossie Arbour, MD  Service setting: Ambulatory outpatient  Location: [facility] (AMB) Pain Management Facility  Visit type: Procedure  Specialty: Interventional Pain Management  Patient type: Established   Primary Reason for Visit: Interventional Pain Management Treatment. CC: Back Pain (lower)  Procedure:  Anesthesia, Analgesia, Anxiolysis:  Type: Diagnostic Medial Branch Facet Block Region: Lumbar Level: L2, L3, L4, L5, & S1 Medial Branch Level(s) Laterality: Bilateral  Type: Local Anesthesia Local Anesthetic: Lidocaine 1% Route: Infiltration ([place]/IM) IV Access: Declined Sedation: Declined  Indication(s): Analgesia          Indications: 1. Lumbar facet syndrome (Bilateral) (R>L)   2. Chronic low back pain (Bilateral) (R>L)   3. Lumbar spondylosis    Pain Score: Pre-procedure: 8 /10 Post-procedure: 0-No pain/10  Pre-op Assessment:  Previous date of service: 06/04/16 Service provided: Med Refill Brittney Simpson is a 83 y.o. (year old), female patient, seen today for interventional treatment. She  has a past surgical history that includes Hernia repair; Hernia repair; and Hemorrhoid surgery. Her primarily concern today is the Back Pain (lower)  Initial Vital Signs: Blood pressure (!) 176/92, pulse (!) 57, temperature 98.3 F (36.8 C), resp. rate 16, height 5\' 6"  (1.676 m), weight 129 lb (58.5 kg), SpO2 98 %. BMI: 20.82 kg/m  Risk Assessment: Allergies: Reviewed. She has No Known Allergies.  Allergy Precautions: None required Coagulopathies: "Reviewed. None identified.  Blood-thinner therapy: None at this time Active Infection(s): Reviewed. None identified. Brittney Simpson is afebrile  Site Confirmation: Ms. Ketterman was asked to confirm the procedure and laterality before marking the site Procedure  checklist: Completed Consent: Before the procedure and under the influence of no sedative(s), amnesic(s), or anxiolytics, the patient was informed of the treatment options, risks and possible complications. To fulfill our ethical and legal obligations, as recommended by the American Medical Association's Code of Ethics, I have informed the patient of my clinical impression; the nature and purpose of the treatment or procedure; the risks, benefits, and possible complications of the intervention; the alternatives, including doing nothing; the risk(s) and benefit(s) of the alternative treatment(s) or procedure(s); and the risk(s) and benefit(s) of doing nothing. The patient was provided information about the general risks and possible complications associated with the procedure. These may include, but are not limited to: failure to achieve desired goals, infection, bleeding, organ or nerve damage, allergic reactions, paralysis, and death. In addition, the patient was informed of those risks and complications associated to Spine-related procedures, such as failure to decrease pain; infection (i.e.: Meningitis, epidural or intraspinal abscess); bleeding (i.e.: epidural hematoma, subarachnoid hemorrhage, or any other type of intraspinal or peri-dural bleeding); organ or nerve damage (i.e.: Any type of peripheral nerve, nerve root, or spinal cord injury) with subsequent damage to sensory, motor, and/or autonomic systems, resulting in permanent pain, numbness, and/or weakness of one or several areas of the body; allergic reactions; (i.e.: anaphylactic reaction); and/or death. Furthermore, the patient was informed of those risks and complications associated with the medications. These include, but are not limited to: allergic reactions (i.e.: anaphylactic or anaphylactoid reaction(s)); adrenal axis suppression; blood sugar elevation that in diabetics may result in ketoacidosis or comma; water retention that in patients  with history of congestive heart failure may result in shortness of breath, pulmonary edema, and decompensation with resultant heart failure; weight gain; swelling or edema;  medication-induced neural toxicity; particulate matter embolism and blood vessel occlusion with resultant organ, and/or nervous system infarction; and/or aseptic necrosis of one or more joints. Finally, the patient was informed that Medicine is not an exact science; therefore, there is also the possibility of unforeseen or unpredictable risks and/or possible complications that may result in a catastrophic outcome. The patient indicated having understood very clearly. We have given the patient no guarantees and we have made no promises. Enough time was given to the patient to ask questions, all of which were answered to the patient's satisfaction. Ms. Burkman has indicated that she wanted to continue with the procedure. Attestation: I, the ordering provider, attest that I have discussed with the patient the benefits, risks, side-effects, alternatives, likelihood of achieving goals, and potential problems during recovery for the procedure that I have provided informed consent. Date: 11/06/2016; Time: 9:29 AM  Pre-Procedure Preparation:  Monitoring: As per clinic protocol. Respiration, ETCO2, SpO2, BP, heart rate and rhythm monitor placed and checked for adequate function Safety Precautions: Patient was assessed for positional comfort and pressure points before starting the procedure. Time-out: I initiated and conducted the "Time-out" before starting the procedure, as per protocol. The patient was asked to participate by confirming the accuracy of the "Time Out" information. Verification of the correct person, site, and procedure were performed and confirmed by me, the nursing staff, and the patient. "Time-out" conducted as per Joint Commission's Universal Protocol (UP.01.01.01). "Time-out" Date & Time: 11/06/2016; 1001 hrs.  Description  of Procedure Process:   Position: Prone Target Area: For Lumbar Facet blocks, the target is the groove formed by the junction of the transverse process and superior articular process. For the L5 dorsal ramus, the target is the notch between superior articular process and sacral ala. For the S1 dorsal ramus, the target is the superior and lateral edge of the posterior S1 Sacral foramen. Approach: Paramedial approach. Area Prepped: Entire Posterior Lumbosacral Region Prepping solution: ChloraPrep (2% chlorhexidine gluconate and 70% isopropyl alcohol) Safety Precautions: Aspiration looking for blood return was conducted prior to all injections. At no point did we inject any substances, as a needle was being advanced. No attempts were made at seeking any paresthesias. Safe injection practices and needle disposal techniques used. Medications properly checked for expiration dates. SDV (single dose vial) medications used. Description of the Procedure: Protocol guidelines were followed. The patient was placed in position over the fluoroscopy table. The target area was identified and the area prepped in the usual manner. Skin desensitized using vapocoolant spray. Skin & deeper tissues infiltrated with local anesthetic. Appropriate amount of time allowed to pass for local anesthetics to take effect. The procedure needle was introduced through the skin, ipsilateral to the reported pain, and advanced to the target area. Employing the "Medial Branch Technique", the needles were advanced to the angle made by the superior and medial portion of the transverse process, and the lateral and inferior portion of the superior articulating process of the targeted vertebral bodies. This area is known as "[place]'s Eye" or the "Eye of the [place] Dog". A procedure needle was introduced through the skin, and this time advanced to the angle made by the superior and medial border of the sacral ala, and the lateral border of the S1  vertebral body. This last needle was later repositioned at the superior and lateral border of the posterior S1 foramen. Negative aspiration confirmed. Solution injected in intermittent fashion, asking for systemic symptoms every 0.5cc of injectate. The needles were then removed  and the area cleansed, making sure to leave some of the prepping solution back to take advantage of its long term bactericidal properties. Vitals:   11/06/16 1010 11/06/16 1015 11/06/16 1018 11/06/16 1021  BP: (!) 185/75 (!) 179/77 (!) 178/109 (!) 187/106  Pulse: 64 63 66 64  Resp: 13 15 20 14   Temp:      SpO2: 97% 97% 97% 96%  Weight:      Height:        Start Time: 1001 hrs. End Time: 1017 hrs.  Illustration of the posterior view of the lumbar spine and the posterior neural structures. Laminae of L2 through S1 are labeled. DPRL5, dorsal primary ramus of L5; DPRS1, dorsal primary ramus of S1; DPR3, dorsal primary ramus of L3; FJ, facet (zygapophyseal) joint L3-L4; I, inferior articular process of L4; LB1, lateral branch of dorsal primary ramus of L1; IAB, inferior articular branches from L3 medial branch (supplies L4-L5 facet joint); IBP, intermediate branch plexus; MB3, medial branch of dorsal primary ramus of L3; NR3, third lumbar nerve root; S, superior articular process of L5; SAB, superior articular branches from L4 (supplies L4-5 facet joint also); TP3, transverse process of L3.  Materials:  Needle(s) Type: Regular needle Gauge: 25G Length: 3.5-in Medication(s): We administered triamcinolone acetonide, triamcinolone acetonide, ropivacaine (PF) 2 mg/mL (0.2%), and ropivacaine (PF) 2 mg/mL (0.2%). Please see chart orders for dosing details.  Imaging Guidance (Spinal):  Type of Imaging Technique: Fluoroscopy Guidance (Spinal) Indication(s): Assistance in needle guidance and placement for procedures requiring needle placement in or near specific anatomical locations not easily accessible without such  assistance. Exposure Time: Please see nurses notes. Contrast: None used. Fluoroscopic Guidance: I was personally present during the use of fluoroscopy. "Tunnel Vision Technique" used to obtain the best possible view of the target area. Parallax error corrected before commencing the procedure. "Direction-depth-direction" technique used to introduce the needle under continuous pulsed fluoroscopy. Once target was reached, antero-posterior, oblique, and lateral fluoroscopic projection used confirm needle placement in all planes. Images permanently stored in EMR. Interpretation: No contrast injected. I personally interpreted the imaging intraoperatively. Adequate needle placement confirmed in multiple planes. Permanent images saved into the patient's record.  Antibiotic Prophylaxis:  Indication(s): None identified Antibiotic given: None  Post-operative Assessment:  EBL: None Complications: No immediate post-treatment complications observed by team, or reported by patient. Note: The patient tolerated the entire procedure well. A repeat set of vitals were taken after the procedure and the patient was kept under observation following institutional policy, for this type of procedure. Post-procedural neurological assessment was performed, showing return to baseline, prior to discharge. The patient was provided with post-procedure discharge instructions, including a section on how to identify potential problems. Should any problems arise concerning this procedure, the patient was given instructions to immediately contact us, at any time, without hesitation. In any case, we plan to contact the patient by telephone for a follow-up status report regarding this interventional procedure. Comments:  No additional relevant information.  Plan of Care  Disposition: Discharge home  Discharge Date & Time: 11/06/2016; 1028 hrs.  Physician-requested Follow-up:  Return in about 2 weeks (around 11/20/2016) for  Post-Procedure evaluation.  Future Appointments Date Time Provider [place]  11/19/2016 11:15 AM Milinda Pointer, MD [facility] None  05/02/2017 3:00 PM Olin Hauser, DO [facility] None   Medications ordered for procedure: Meds ordered this encounter  Medications  . triamcinolone acetonide (KENALOG-40) injection 40 mg  . triamcinolone acetonide (KENALOG-40) injection 40 mg  . lidocaine (  PF) (XYLOCAINE) 1 % injection 10 mL  . ropivacaine (PF) 2 mg/mL (0.2%) (NAROPIN) injection 9 mL  . ropivacaine (PF) 2 mg/mL (0.2%) (NAROPIN) injection 9 mL   Medications administered: We administered triamcinolone acetonide, triamcinolone acetonide, ropivacaine (PF) 2 mg/mL (0.2%), and ropivacaine (PF) 2 mg/mL (0.2%).  See the medical record for exact dosing, route, and time of administration.  Lab-work, Procedure(s), & Referral(s) Ordered: Orders Placed This Encounter  Procedures  . DG C-Arm 1-60 Min-No Report  . Discharge instructions  . Follow-up  . Informed Consent Details: Transcribe to consent form and obtain patient signature  . Provider attestation of informed consent for procedure/surgical case  . Verify informed consent   Imaging Ordered: Results for orders placed in visit on 05/14/16  DG C-Arm 1-60 Min-No Report   Narrative CLINICAL DATA: Assistance in needle guidance and placement for procedures  requiring needle placement in or near specific anatomical locations not  easily accessible without such assistance.   C-ARM 1-60 MINUTES  Fluoroscopy was utilized by the requesting physician.  No radiographic  interpretation.     New Prescriptions   No medications on file   Primary Care Physician: Olin Hauser, DO Location: [facility] Note by: Kathlen Brunswick. Dossie Arbour, M.D, DABA, DABAPM, DABPM, DABIPP, FIPP Date: 11/06/2016; Time: 11:06 AM  Disclaimer:  Medicine is not an Chief Strategy Officer. The only guarantee in medicine is that  nothing is guaranteed. It is important to note that the decision to proceed with this intervention was based on the information collected from the patient. The Data and conclusions were drawn from the patient's questionnaire, the interview, and the physical examination. Because the information was provided in large part by the patient, it cannot be guaranteed that it has not been purposely or unconsciously manipulated. Every effort has been made to obtain as much relevant data as possible for this evaluation. It is important to note that the conclusions that lead to this procedure are derived in large part from the available data. Always take into account that the treatment will also be dependent on availability of resources and existing treatment guidelines, considered by other Pain Management Practitioners as being common knowledge and practice, at the time of the intervention. For Medico-Legal purposes, it is also important to point out that variation in procedural techniques and pharmacological choices are the acceptable norm. The indications, contraindications, technique, and results of the above procedure should only be interpreted and judged by a Board-Certified Interventional Pain Specialist with extensive familiarity and expertise in the same exact procedure and technique. Attempts at providing opinions without similar or greater experience and expertise than that of the treating physician will be considered as inappropriate and unethical, and shall result in a formal complaint to the state medical board and applicable specialty societies.  Instructions provided at this appointment: Patient Instructions   Post-Procedure instructions Instructions:  Apply ice: Fill a plastic sandwich bag with crushed ice. Cover it with a small towel and apply to injection site. Apply for 15 minutes then remove x 15 minutes. Repeat sequence on day of procedure, until you go to bed. The purpose is to minimize swelling  and discomfort after procedure.  Apply heat: Apply heat to procedure site starting the day following the procedure. The purpose is to treat any soreness and discomfort from the procedure.  Food intake: Start with clear liquids (like water) and advance to regular food, as tolerated.   Physical activities: Keep activities to a minimum for the first 8  hours after the procedure.   Driving: If you have received any sedation, you are not allowed to drive for 24 hours after your procedure.  Blood thinner: Restart your blood thinner 6 hours after your procedure. (Only for those taking blood thinners)  Insulin: As soon as you can eat, you may resume your normal dosing schedule. (Only for those taking insulin)  Infection prevention: Keep procedure site clean and dry.  Post-procedure Pain Diary: Extremely important that this be done correctly and accurately. Recorded information will be used to determine the next step in treatment.  Pain evaluated is that of treated area only. Do not include pain from an untreated area.  Complete every hour, on the hour, for the initial 8 hours. Set an alarm to help you do this part accurately.  Do not go to sleep and have it completed later. It will not be accurate.  Follow-up appointment: Keep your follow-up appointment after the procedure. Usually 2 weeks for most procedures. (6 weeks in the case of radiofrequency.) Bring you pain diary.  Expect:  From numbing medicine (AKA: Local Anesthetics): Numbness or decrease in pain.  Onset: Full effect within 15 minutes of injected.  Duration: It will depend on the type of local anesthetic used. On the average, 1 to 8 hours.   From steroids: Decrease in swelling or inflammation. Once inflammation is improved, relief of the pain will follow.  Onset of benefits: Depends on the amount of swelling present. The more swelling, the longer it will take for the benefits to be seen.   Duration: Steroids will stay in the  system x 2 weeks. Duration of benefits will depend on multiple posibilities including persistent irritating factors.  From procedure: Some discomfort is to be expected once the numbing medicine wears off. This should be minimal if ice and heat are applied as instructed. Call if:  You experience numbness and weakness that gets worse with time, as opposed to wearing off.  New onset bowel or bladder incontinence. (Spinal procedures only)  Emergency Numbers:  Durning business hours (Monday - Thursday, 8:00 AM - 4:00 PM) (Friday, 9:00 AM - 12:00 Noon): [phone]  After hours: [phone]   __________________________________________________________________________________________   Pain Score  Introduction: The pain score used by this practice is the Verbal Numerical Rating Scale (VNRS-11). This is an 11-point scale. It is for adults and children 10 years or older. There are significant differences in how the pain score is reported, used, and applied. Forget everything you learned in the past and learn this scoring system.  General Information: The scale should reflect your current level of pain. Unless you are specifically asked for the level of your worst pain, or your average pain. If you are asked for one of these two, then it should be understood that it is over the past 24 hours.  Basic Activities of Daily Living (ADL): Personal hygiene, dressing, eating, transferring, and using restroom.  Instructions: Most patients tend to report their level of pain as a combination of two factors, their physical pain and their psychosocial pain. This last one is also known as "suffering" and it is reflection of how physical pain affects you socially and psychologically. From now on, report them separately. From this point on, when asked to report your pain level, report only your physical pain. Use the following table for reference.  Pain Clinic Pain Levels (0-5/10)  Pain Level Score  Description  No Pain 0   Mild pain 1 Nagging, annoying, but does  not interfere with basic activities of daily living (ADL). Patients are able to eat, bathe, get dressed, toileting (being able to get on and off the toilet and perform personal hygiene functions), transfer (move in and out of bed or a chair without assistance), and maintain continence (able to control bladder and bowel functions). Blood pressure and heart rate are unaffected. A normal heart rate for a healthy adult ranges from 60 to 100 bpm (beats per minute).   Mild to moderate pain 2 Noticeable and distracting. Impossible to hide from other people. More frequent flare-ups. Still possible to adapt and function close to normal. It can be very annoying and may have occasional stronger flare-ups. With discipline, patients may get used to it and adapt.   Moderate pain 3 Interferes significantly with activities of daily living (ADL). It becomes difficult to feed, bathe, get dressed, get on and off the toilet or to perform personal hygiene functions. Difficult to get in and out of bed or a chair without assistance. Very distracting. With effort, it can be ignored when deeply involved in activities.   Moderately severe pain 4 Impossible to ignore for more than a few minutes. With effort, patients may still be able to manage work or participate in some social activities. Very difficult to concentrate. Signs of autonomic nervous system discharge are evident: dilated pupils (mydriasis); mild sweating (diaphoresis); sleep interference. Heart rate becomes elevated (>115 bpm). Diastolic blood pressure (lower number) rises above 100 mmHg. Patients find relief in laying down and not moving.   Severe pain 5 Intense and extremely unpleasant. Associated with frowning face and frequent crying. Pain overwhelms the senses.  Ability to do any activity or maintain social relationships becomes significantly limited. Conversation becomes difficult. Pacing back and  forth is common, as getting into a comfortable position is nearly impossible. Pain wakes you up from deep sleep. Physical signs will be obvious: pupillary dilation; increased sweating; goosebumps; brisk reflexes; cold, clammy hands and feet; nausea, vomiting or dry heaves; loss of appetite; significant sleep disturbance with inability to fall asleep or to remain asleep. When persistent, significant weight loss is observed due to the complete loss of appetite and sleep deprivation.  Blood pressure and heart rate becomes significantly elevated. Caution: If elevated blood pressure triggers a pounding headache associated with blurred vision, then the patient should immediately seek attention at an urgent or emergency care unit, as these may be signs of an impending stroke.    Emergency Department Pain Levels (6-10/10)  Emergency Room Pain 6 Severely limiting. Requires emergency care and should not be seen or managed at an outpatient pain management facility. Communication becomes difficult and requires great effort. Assistance to reach the emergency department may be required. Facial flushing and profuse sweating along with potentially dangerous increases in heart rate and blood pressure will be evident.   Distressing pain 7 Self-care is very difficult. Assistance is required to transport, or use restroom. Assistance to reach the emergency department will be required. Tasks requiring coordination, such as bathing and getting dressed become very difficult.   Disabling pain 8 Self-care is no longer possible. At this level, pain is disabling. The individual is unable to do even the most "basic" activities such as walking, eating, bathing, dressing, transferring to a bed, or toileting. Fine motor skills are lost. It is difficult to think clearly.   Incapacitating pain 9 Pain becomes incapacitating. Thought processing is no longer possible. Difficult to remember your own name. Control of movement and coordination  are  lost.   The worst pain imaginable 10 At this level, most patients pass out from pain. When this level is reached, collapse of the autonomic nervous system occurs, leading to a sudden drop in blood pressure and heart rate. This in turn results in a temporary and dramatic drop in blood flow to the brain, leading to a loss of consciousness. Fainting is one of the body's self defense mechanisms. Passing out puts the brain in a calmed state and causes it to shut down for a while, in order to begin the healing process.    Summary: 1. Refer to this scale when providing [place] with your pain level. 2. Be accurate and careful when reporting your pain level. This will help with your care. 3. Over-reporting your pain level will lead to loss of credibility. 4. Even a level of 1/10 means that there is pain and will be treated at our facility. 5. High, inaccurate reporting will be documented as "Symptom Exaggeration", leading to loss of credibility and suspicions of possible secondary gains such as obtaining more narcotics, or wanting to appear disabled, for fraudulent reasons. 6. Only pain levels of 5 or below will be seen at our facility. 7. Pain levels of 6 and above will be sent to the Emergency Department and the appointment cancelled. _____________________________________________________________________________________________  Pain Management Discharge Instructions  General Discharge Instructions :  If you need to reach your doctor call: Monday-Friday 8:00 am - 4:00 pm at [phone] or toll free [phone].  After clinic hours [phone] to have operator reach doctor.  Bring all of your medication bottles to all your appointments in the pain clinic.  To cancel or reschedule your appointment with Pain Management please remember to call 24 hours in advance to avoid a fee.  Refer to the educational materials which you have been given on: General Risks, I had my Procedure. Discharge  Instructions, Post Sedation.  Post Procedure Instructions:  The drugs you were given will stay in your system until tomorrow, so for the next 24 hours you should not drive, make any legal decisions or drink any alcoholic beverages.  You may eat anything you prefer, but it is better to start with liquids then soups and crackers, and gradually work up to solid foods.  Please notify your doctor immediately if you have any unusual bleeding, trouble breathing or pain that is not related to your normal pain.  Depending on the type of procedure that was done, some parts of your body may feel week and/or numb.  This usually clears up by tonight or the next day.  Walk with the use of an assistive device or accompanied by an adult for the 24 hours.  You may use ice on the affected area for the first 24 hours.  Put ice in a Ziploc bag and cover with a towel and place against area 15 minutes on 15 minutes off.  You may switch to heat after 24 hours. Facet Joint Block, Care After Refer to this sheet in the next few weeks. These instructions provide you with information about caring for yourself after your procedure. Your health care provider may also give you more specific instructions. Your treatment has been planned according to current medical practices, but problems sometimes occur. Call your health care provider if you have any problems or questions after your procedure. What can I expect after the procedure? After the procedure, it is common to have:  Some tenderness over the injection sites for 2 days after the procedure.  A temporary increase in blood sugar if you have diabetes. Follow these instructions at home:  Keep track of the amount of pain relief you feel and how long it lasts.  Take over-the-counter and prescription medicines only as told by your health care provider. You may need to limit pain medicine within the first 4-6 hours after the procedure.  Remove your bandages (dressings)  the morning after the procedure.  For the first 24 hours after the procedure:  Do not apply heat near or over the injection sites.  Do not take a bath or soak in water, such as in a pool or lake.  Do not drive or operate heavy machinery unless approved by your health care provider.  Avoid activities that require a lot of energy.  If the injection site is tender, try applying ice to the area. To do this:  Put ice in a plastic bag.  Place a towel between your skin and the bag.  Leave the ice on for 20 minutes, 2-3 times a day.  Keep all follow-up visits as told by your health care provider. This is important. Contact a health care provider if:  Fluid is coming from an injection site.  There is significant bleeding or swelling at an injection site.  You have diabetes and your blood sugar is above 180 mg/dL. Get help right away if:  You have a fever.  You have worsening pain or swelling around an injection site.  There are red streaks around an injection site.  You develop severe pain that is not controlled by your medicines.  You develop a headache, stiff neck, nausea, or vomiting.  Your eyes become very sensitive to light.  You have weakness, paralysis, or tingling in your arms or legs that was not present before the procedure.  You have difficulty urinating or breathing. This information is not intended to replace advice given to you by your health care provider. Make sure you discuss any questions you have with your health care provider. Document Released: 07/01/2012 Document Revised: 11/29/2015 Document Reviewed: 04/10/2015 Elsevier Interactive Patient Education  2017 [employer]  Facet Joint Block The facet joints connect the bones of the spine (vertebrae). They make it possible for you to bend, twist, and make other movements with your spine. They also keep you from bending too far, twisting too far, and making other excessive movements. A facet joint block is  a procedure where a numbing medicine (anesthetic) is injected into a facet joint. Often, a type of anti-inflammatory medicine called a steroid is also injected. A facet joint block may be done to diagnose neck or back pain. If the pain gets better after a facet joint block, it means the pain is probably coming from the facet joint. If the pain does not get better, it means the pain is probably not coming from the facet joint. A facet joint block may also be done to relieve neck or back pain caused by an inflamed facet joint. A facet joint block is only done to relieve pain if the pain does not improve with other methods, such as medicine, exercise programs, and physical therapy. Tell a health care provider about:  Any allergies you have.  All medicines you are taking, including vitamins, herbs, eye drops, creams, and over-the-counter medicines.  Any problems you or family members have had with anesthetic medicines.  Any blood disorders you have.  Any surgeries you have had.  Any medical conditions you have.  Whether you are pregnant or  may be pregnant. What are the risks? Generally, this is a safe procedure. However, problems may occur, including:  Bleeding.  Injury to a nerve near the injection site.  Pain at the injection site.  Weakness or numbness in areas controlled by nerves near the injection site.  Infection.  Temporary fluid retention.  Allergic reactions to medicines or dyes.  Injury to other structures or organs near the injection site. What happens before the procedure?  Follow instructions from your health care provider about eating or drinking restrictions.  Ask your health care provider about:  Changing or stopping your regular medicines. This is especially important if you are taking diabetes medicines or blood thinners.  Taking medicines such as aspirin and ibuprofen. These medicines can thin your blood. Do not take these medicines before your procedure if  your health care provider instructs you not to.  Do not take any new dietary supplements or medicines without asking your health care provider first.  Plan to have someone take you home after the procedure. What happens during the procedure?  You may need to remove your clothing and dress in an open-back gown.  The procedure will be done while you are lying on an X-ray table. You will most likely be asked to lie on your stomach, but you may be asked to lie in a different position if an injection will be made in your neck.  Machines will be used to monitor your oxygen levels, heart rate, and blood pressure.  If an injection will be made in your neck, an IV tube will be inserted into one of your veins. Fluids and medicine will flow directly into your body through the IV tube.  The area over the facet joint where the injection will be made will be cleaned with soap. The surrounding skin will be covered with clean drapes.  A numbing medicine (local anesthetic) will be applied to your skin. Your skin may sting or burn for a moment.  A video X-ray machine (fluoroscopy) will be used to locate the joint. In some cases, a CT scan may be used.  A contrast dye may be injected into the facet joint area to help locate the joint.  When the joint is located, an anesthetic will be injected into the joint through the needle.  Your health care provider will ask you whether you feel pain relief. If you do feel relief, a steroid may be injected to provide pain relief for a longer period of time. If you do not feel relief or feel only partial relief, additional injections of an anesthetic may be made in other facet joints.  The needle will be removed.  Your skin will be cleaned.  A bandage (dressing) will be applied over each injection site. The procedure may vary among health care providers and hospitals. What happens after the procedure?  You will be observed for 15-30 minutes before being allowed to  go home. This information is not intended to replace advice given to you by your health care provider. Make sure you discuss any questions you have with your health care provider. Document Released: 12/04/2006 Document Revised: 08/16/2015 Document Reviewed: 04/10/2015 Elsevier Interactive Patient Education  2017 [employer].

## 2016-11-07 ENCOUNTER — Telehealth: Payer: Self-pay | Admitting: *Deleted

## 2016-11-18 ENCOUNTER — Telehealth: Payer: Self-pay | Admitting: Family Medicine

## 2016-11-18 DIAGNOSIS — E785 Hyperlipidemia, unspecified: Secondary | ICD-10-CM | POA: Insufficient documentation

## 2016-11-18 DIAGNOSIS — N183 Chronic kidney disease, stage 3 unspecified: Secondary | ICD-10-CM

## 2016-11-18 DIAGNOSIS — I10 Essential (primary) hypertension: Secondary | ICD-10-CM

## 2016-11-18 DIAGNOSIS — D631 Anemia in chronic kidney disease: Secondary | ICD-10-CM

## 2016-11-18 DIAGNOSIS — E782 Mixed hyperlipidemia: Secondary | ICD-10-CM

## 2016-11-18 DIAGNOSIS — R7309 Other abnormal glucose: Secondary | ICD-10-CM | POA: Insufficient documentation

## 2016-11-18 DIAGNOSIS — E034 Atrophy of thyroid (acquired): Secondary | ICD-10-CM

## 2016-11-18 NOTE — Telephone Encounter (Signed)
Dr Mackey Birchwood at [facility] wasn't sure about labs Dr. Raliegh Ip wanted to check so she asked for him to order labs for pt to do at this location.  Pt;s daughter also said she has been complaining about staying thirsty and not having any energy.  Dr. Mackey Birchwood asked if he would also check her potassium. Please call [name] to schedule and let her know if its a fasting lab [phone]

## 2016-11-18 NOTE — Telephone Encounter (Signed)
Placed orders for the following labs:  1. CMET 2. Lipid panel 3. CBC with diff 4. TSH 5. Hemoglobin A1c  Please notify patient / daughter [name] that the lab draw will be FASTING and that she can schedule a Lab Only visit at anytime in next few days to weeks at our office.  Nobie Putnam, [facility] 11/18/2016, 5:04 PM

## 2016-11-19 ENCOUNTER — Ambulatory Visit: Payer: Medicare Other | Attending: Pain Medicine | Admitting: Pain Medicine

## 2016-11-19 ENCOUNTER — Encounter: Payer: Self-pay | Admitting: Pain Medicine

## 2016-11-19 VITALS — BP 170/57 | HR 51 | Temp 97.9°F | Resp 18 | Ht 66.0 in | Wt 129.0 lb

## 2016-11-19 DIAGNOSIS — Z79899 Other long term (current) drug therapy: Secondary | ICD-10-CM | POA: Diagnosis not present

## 2016-11-19 DIAGNOSIS — M4317 Spondylolisthesis, lumbosacral region: Secondary | ICD-10-CM | POA: Diagnosis not present

## 2016-11-19 DIAGNOSIS — R7309 Other abnormal glucose: Secondary | ICD-10-CM | POA: Insufficient documentation

## 2016-11-19 DIAGNOSIS — M4696 Unspecified inflammatory spondylopathy, lumbar region: Secondary | ICD-10-CM | POA: Diagnosis not present

## 2016-11-19 DIAGNOSIS — Z79891 Long term (current) use of opiate analgesic: Secondary | ICD-10-CM | POA: Insufficient documentation

## 2016-11-19 DIAGNOSIS — M5416 Radiculopathy, lumbar region: Secondary | ICD-10-CM

## 2016-11-19 DIAGNOSIS — M47816 Spondylosis without myelopathy or radiculopathy, lumbar region: Secondary | ICD-10-CM

## 2016-11-19 DIAGNOSIS — M79604 Pain in right leg: Secondary | ICD-10-CM | POA: Diagnosis not present

## 2016-11-19 DIAGNOSIS — M8580 Other specified disorders of bone density and structure, unspecified site: Secondary | ICD-10-CM | POA: Insufficient documentation

## 2016-11-19 DIAGNOSIS — H9193 Unspecified hearing loss, bilateral: Secondary | ICD-10-CM | POA: Diagnosis not present

## 2016-11-19 DIAGNOSIS — E785 Hyperlipidemia, unspecified: Secondary | ICD-10-CM | POA: Insufficient documentation

## 2016-11-19 DIAGNOSIS — F1190 Opioid use, unspecified, uncomplicated: Secondary | ICD-10-CM

## 2016-11-19 DIAGNOSIS — G8929 Other chronic pain: Secondary | ICD-10-CM | POA: Diagnosis not present

## 2016-11-19 DIAGNOSIS — G4700 Insomnia, unspecified: Secondary | ICD-10-CM | POA: Insufficient documentation

## 2016-11-19 DIAGNOSIS — M48062 Spinal stenosis, lumbar region with neurogenic claudication: Secondary | ICD-10-CM

## 2016-11-19 DIAGNOSIS — I129 Hypertensive chronic kidney disease with stage 1 through stage 4 chronic kidney disease, or unspecified chronic kidney disease: Secondary | ICD-10-CM | POA: Insufficient documentation

## 2016-11-19 DIAGNOSIS — Z823 Family history of stroke: Secondary | ICD-10-CM | POA: Diagnosis not present

## 2016-11-19 DIAGNOSIS — M533 Sacrococcygeal disorders, not elsewhere classified: Secondary | ICD-10-CM | POA: Diagnosis not present

## 2016-11-19 DIAGNOSIS — N183 Chronic kidney disease, stage 3 (moderate): Secondary | ICD-10-CM | POA: Insufficient documentation

## 2016-11-19 DIAGNOSIS — M545 Low back pain: Secondary | ICD-10-CM

## 2016-11-19 DIAGNOSIS — M791 Myalgia: Secondary | ICD-10-CM | POA: Insufficient documentation

## 2016-11-19 DIAGNOSIS — M488X6 Other specified spondylopathies, lumbar region: Secondary | ICD-10-CM | POA: Diagnosis not present

## 2016-11-19 DIAGNOSIS — Z5181 Encounter for therapeutic drug level monitoring: Secondary | ICD-10-CM | POA: Diagnosis not present

## 2016-11-19 DIAGNOSIS — M4726 Other spondylosis with radiculopathy, lumbar region: Secondary | ICD-10-CM | POA: Insufficient documentation

## 2016-11-19 DIAGNOSIS — G894 Chronic pain syndrome: Secondary | ICD-10-CM

## 2016-11-19 DIAGNOSIS — M1611 Unilateral primary osteoarthritis, right hip: Secondary | ICD-10-CM | POA: Insufficient documentation

## 2016-11-19 DIAGNOSIS — F419 Anxiety disorder, unspecified: Secondary | ICD-10-CM | POA: Diagnosis not present

## 2016-11-19 DIAGNOSIS — E039 Hypothyroidism, unspecified: Secondary | ICD-10-CM | POA: Insufficient documentation

## 2016-11-19 MED ORDER — TRAMADOL HCL 50 MG PO TABS: 50.0000 mg | ORAL_TABLET | Freq: Three times a day (TID) | ORAL | 5 refills | Status: DC | PRN

## 2016-11-19 NOTE — Progress Notes (Signed)
Patient's Name: Brittney Simpson  MRN: [id number]  Referring Provider: Arlis Porta., MD  DOB: 29-Jul-1928  PCP: Olin Hauser, DO  DOS: 11/19/2016  Note by: Kathlen Brunswick. Dossie Arbour, MD  Service setting: Ambulatory outpatient  Specialty: Interventional Pain Management  Location: [facility] (AMB) Pain Management Facility    Patient type: Established   Primary Reason(s) for Visit: Encounter for prescription drug management & post-procedure evaluation of chronic illness with mild to moderate exacerbation(Level of risk: moderate) CC: Back Pain (lower)  HPI  Brittney Simpson is a 83 y.o. year old, female patient, who comes today for a post-procedure evaluation and medication management. She has Chronic pain; Long term current use of opiate analgesic; Long term prescription opiate use; Opiate use (15 MME/Day); Encounter for therapeutic drug level monitoring; Lumbar facet syndrome (Bilateral) (R>L); Chronic low back pain (Bilateral) (R>L); Spondylolisthesis of lumbosacral region (L2-3 and L5-S1); Lumbar spinal stenosis (9 mm at L3-4); Trochanteric bursitis (Right); Chronic hip pain (Right); Osteoarthritis of hip (Right); Chronic sacroiliac joint pain (Right); Chronic lumbar radicular pain (Right); Chronic lower extremity pain (Right); Myofascial pain; Encounter for chronic pain management; Essential hypertension; CKD (chronic kidney disease) stage 3, GFR 30-59 ml/min; Skin lesion of scalp; Chronic anxiety; Insomnia; Hypothyroidism; Lumbar spondylosis; Recurrent UTI; GERD (gastroesophageal reflux disease); Presbycusis of both ears; Hyperlipidemia; Abnormal glucose; and Bilateral hearing loss on her problem list. Her primarily concern today is the Back Pain (lower)  Pain Assessment: Self-Reported Pain Score: 5 /10 Clinically the patient looks like a 0/10 Reported level is inconsistent with clinical observations. Information on the proper use of the pain scale provided to the patient today Pain Type: Chronic  pain Pain Location: Back Pain Orientation: Lower Pain Descriptors / Indicators: Dull Pain Frequency: Intermittent  Brittney Simpson was last seen on 11/06/2016 for a procedure. During today's appointment we reviewed Brittney Simpson's post-procedure results, as well as her outpatient medication regimen.  Further details on both, my assessment(s), as well as the proposed treatment plan, please see below.  Controlled Substance Pharmacotherapy Assessment REMS (Risk Evaluation and Mitigation Strategy)  Analgesic:Tramadol 50 mg 1 tablet by mouth every 8 hours (150 mg/day of tramadol) MME/day:15 mg/day. [name], RN  11/19/2016 11:32 AM  Sign at close encounter Nursing Pain Medication Assessment:  Safety precautions to be maintained throughout the outpatient stay will include: orient to surroundings, keep bed in low position, maintain call bell within reach at all times, provide assistance with transfer out of bed and ambulation.  Medication Inspection Compliance: Pill count conducted under aseptic conditions, in front of the patient. Neither the pills nor the bottle was removed from the patient's sight at any time. Once count was completed pills were immediately returned to the patient in their original bottle.  Medication: Tramadol (Ultram) Pill/Patch Count: 11 of 90 pills remain Pill/Patch Appearance: Markings consistent with prescribed medication Bottle Appearance: Standard pharmacy container. Clearly labeled. Filled Date 03/26/ 2018 Last Medication intake:  Today   Pharmacokinetics: Liberation and absorption (onset of action): WNL Distribution (time to peak effect): WNL Metabolism and excretion (duration of action): WNL         Pharmacodynamics: Desired effects: Analgesia: Brittney Simpson reports >50% benefit. Functional ability: Patient reports that medication allows her to accomplish basic ADLs Clinically meaningful improvement in function (CMIF): Sustained CMIF goals met Perceived  effectiveness: Described as relatively effective, allowing for increase in activities of daily living (ADL) Undesirable effects: Side-effects or Adverse reactions: None reported Monitoring: [place] PMP: Online review of the past 72-month  period conducted. Compliant with practice rules and regulations List of all UDS test(s) done:  Lab Results  Component Value Date   TOXASSSELUR FINAL 05/24/2015   Last UDS on record: ToxAssure Select 13  Date Value Ref Range Status  05/24/2015 FINAL  Final    Comment:    ==================================================================== TOXASSURE SELECT 13 (MW) ==================================================================== Test                             Result       Flag       Units Drug Present   7-aminoclonazepam              270                     ng/mg creat    7-aminoclonazepam is an expected metabolite of clonazepam. Source    of clonazepam is a scheduled prescription medication.   Tramadol                       PRESENT   O-Desmethyltramadol            PRESENT   N-Desmethyltramadol            PRESENT    Source of tramadol is a prescription medication.    O-desmethyltramadol and N-desmethyltramadol are expected    metabolites of tramadol. ==================================================================== Test                      Result    Flag   Units      Ref Range   Creatinine              47               mg/dL      >=20 ==================================================================== Declared Medications:  Medication list was not provided. ==================================================================== For clinical consultation, please call [phone]. ====================================================================    UDS interpretation: Compliant          Medication Assessment Form: Reviewed. Patient indicates being compliant with therapy Treatment compliance: Compliant Risk Assessment Profile: Aberrant  behavior: See prior evaluations. None observed or detected today Comorbid factors increasing risk of overdose: See prior notes. No additional risks detected today Risk of substance use disorder (SUD): Low Opioid Risk Tool (ORT) Total Score: 0  Interpretation Table:  Score <3 = Low Risk for SUD  Score between 4-7 = Moderate Risk for SUD  Score >8 = High Risk for Opioid Abuse   Risk Mitigation Strategies:  Patient Counseling: Covered Patient-Prescriber Agreement (PPA): Present and active  Notification to other healthcare providers: Done  Pharmacologic Plan: No change in therapy, at this time  Post-Procedure Assessment  11/06/2016 Procedure: Diagnostic bilateral lumbar facet block under fluoroscopic guidance and IV sedation Pre-procedure pain score:  8/10 Post-procedure pain score: 0/10 (100% relief) Influential Factors: BMI: 20.82 kg/m Intra-procedural challenges: None observed Assessment challenges: Results reported today are inconsistent with those reported on procedure day, immediately before discharge. Previously the patient had reported 100% relief of the pain, before leaving the facility Post-procedural side-effects, adverse reactions, or complications: None reported Reported issues: None  Sedation: Sedation provided. When no sedatives are used, the analgesic levels obtained are directly associated to the effectiveness of the local anesthetics. However, when sedation is provided, the level of analgesia obtained during the initial 1 hour following the intervention, is believed to be the result of a combination of factors.  These factors may include, but are not limited to: 1. The effectiveness of the local anesthetics used. 2. The effects of the analgesic(s) and/or anxiolytic(s) used. 3. The degree of discomfort experienced by the patient at the time of the procedure. 4. The patients ability and reliability in recalling and recording the events. 5. The presence and influence of  possible secondary gains and/or psychosocial factors. Reported result: Relief experienced during the 1st hour after the procedure: 75 % (Ultra-Short Term Relief) Interpretative annotation: Analgesia during this period is likely to be Local Anesthetic and/or IV Sedative (Analgesic/Anxiolitic) related.          Effects of local anesthetic: The analgesic effects attained during this period are directly associated to the localized infiltration of local anesthetics and therefore cary significant diagnostic value as to the etiological location, or anatomical origin, of the pain. Expected duration of relief is directly dependent on the pharmacodynamics of the local anesthetic used. Long-acting (4-6 hours) anesthetics used.  Reported result: Relief during the next 4 to 6 hour after the procedure: 75 % (Short-Term Relief) Interpretative annotation: Complete relief would suggest area to be the source of the pain.          Long-term benefit: Defined as the period of time past the expected duration of local anesthetics. With the possible exception of prolonged sympathetic blockade from the local anesthetics, benefits during this period are typically attributed to, or associated with, other factors such as analgesic sensory neuropraxia, antiinflammatory effects, or beneficial biochemical changes provided by agents other than the local anesthetics Reported result: Extended relief following procedure: 90 % (Long-Term Relief) Interpretative annotation: Good relief. This could suggest inflammation to be a significant component in the etiology to the pain.          Current benefits: Defined as persistent relief that continues at this point in time.   Reported results: Treated area: 90 % Ms. Rheaume reports improvement in function Interpretative annotation: Ongoing benefits would suggest effective palliative intervention  Interpretation: Results would suggest a successful palliative intervention.          Laboratory  Chemistry  Inflammation Markers No results found for: CRP, ESRSEDRATE (CRP: Acute Phase) (ESR: Chronic Phase) Renal Function Markers Lab Results  Component Value Date   BUN 9 06/14/2016   CREATININE 0.91 06/14/2016   GFRAA >60 06/14/2016   GFRNONAA 56 (L) 06/14/2016   Hepatic Function Markers Lab Results  Component Value Date   AST 20 06/14/2016   ALT 14 06/14/2016   ALBUMIN 4.2 06/14/2016   ALKPHOS 68 06/14/2016   Electrolytes Lab Results  Component Value Date   NA 134 (L) 06/14/2016   K 3.4 (L) 06/14/2016   CL 96 (L) 06/14/2016   CALCIUM 9.4 06/14/2016   MG 2.1 01/14/2014   Neuropathy Markers No results found for: [id number] Bone Pathology Markers Lab Results  Component Value Date   ALKPHOS 68 06/14/2016   CALCIUM 9.4 06/14/2016   Coagulation Parameters Lab Results  Component Value Date   PLT 188 06/14/2016   Cardiovascular Markers Lab Results  Component Value Date   HGB 13.9 06/14/2016   HCT 42.1 06/14/2016   Note: Lab results reviewed.  Recent Diagnostic Imaging Review  Dg C-arm 1-60 Min-no Report  Result Date: 11/06/2016 Fluoroscopy was utilized by the requesting physician.  No radiographic interpretation.   Note: Imaging results reviewed.          Meds  The patient has a current medication list which includes the following prescription(s): amlodipine, brimonidine,  clonazepam, vitamin d2, furosemide, latanoprost, levothyroxine, lisinopril, metoprolol succinate, omeprazole, timolol, tramadol, and trazodone.  Current Outpatient Prescriptions on File Prior to Visit  Medication Sig  . amLODipine (NORVASC) 5 MG tablet Take 1 tablet (5 mg total) by mouth daily.  . brimonidine (ALPHAGAN) 0.2 % ophthalmic solution   . clonazePAM (KLONOPIN) 0.5 MG tablet TAKE 1/2 TABLET BY MOUTH TWICE DAILY FOR ANXIETY.  . Ergocalciferol (VITAMIN D2) 2000 UNITS TABS Take 1 capsule by mouth.  . furosemide (LASIX) 20 MG tablet Take 1 tablet (20 mg total) by mouth daily.  Marland Kitchen  latanoprost (XALATAN) 0.005 % ophthalmic solution   . levothyroxine (SYNTHROID, LEVOTHROID) 125 MCG tablet TAKE 1 TABLET BY MOUTH ONCE DAILY ON AN EMPTY STOMACH. WAIT 30 MINUTES BEFORE TAKING OTHER MEDS.  Marland Kitchen lisinopril (PRINIVIL,ZESTRIL) 40 MG tablet Take 1 tablet (40 mg total) by mouth daily.  . metoprolol succinate (TOPROL-XL) 100 MG 24 hr tablet TAKE 1 TABLET BY MOUTH TWICE DAILY.  Marland Kitchen omeprazole (PRILOSEC) 20 MG capsule Take 1 capsule (20 mg total) by mouth daily.  . timolol (TIMOPTIC) 0.5 % ophthalmic solution   . traZODone (DESYREL) 50 MG tablet Take 1 tablet (50 mg total) by mouth at bedtime.   No current facility-administered medications on file prior to visit.    ROS  Constitutional: Denies any fever or chills Gastrointestinal: No reported hemesis, hematochezia, vomiting, or acute GI distress Musculoskeletal: Denies any acute onset joint swelling, redness, loss of ROM, or weakness Neurological: No reported episodes of acute onset apraxia, aphasia, dysarthria, agnosia, amnesia, paralysis, loss of coordination, or loss of consciousness  Allergies  Ms. Rogness has No Known Allergies.  [place]  Drug: Ms. Mroz  reports that she does not use drugs. Alcohol:  reports that she does not drink alcohol. Tobacco:  reports that she has never smoked. She has never used smokeless tobacco. Medical:  has a past medical history of Anxiety; Back ache; Hiatal hernia; Hyperlipidemia; Hypertension; Hypothyroid; Osteopenia; Thyroid disease; and UTI (urinary tract infection). Family: family history includes Cancer in her father; Stroke in her mother.  Past Surgical History:  Procedure Laterality Date  . HEMORRHOID SURGERY    . HERNIA REPAIR    . HERNIA REPAIR     Constitutional Exam  General appearance: Well nourished, well developed, and well hydrated. In no apparent acute distress Vitals:   11/19/16 1125  BP: (!) 170/57  Pulse: (!) 51  Resp: 18  Temp: 97.9 F (36.6 C)  TempSrc: Oral   SpO2: 99%  Weight: 129 lb (58.5 kg)  Height: '5\' 6"'  (1.676 m)   BMI Assessment: Estimated body mass index is 20.82 kg/m as calculated from the following:   Height as of this encounter: '5\' 6"'  (1.676 m).   Weight as of this encounter: 129 lb (58.5 kg).  BMI interpretation table: BMI level Category Range association with higher incidence of chronic pain  <18 kg/m2 Underweight   18.5-24.9 kg/m2 Ideal body weight   25-29.9 kg/m2 Overweight Increased incidence by 20%  30-34.9 kg/m2 Obese (Class I) Increased incidence by 68%  35-39.9 kg/m2 Severe obesity (Class II) Increased incidence by 136%  >40 kg/m2 Extreme obesity (Class III) Increased incidence by 254%   BMI Readings from Last 4 Encounters:  11/19/16 20.82 kg/m  11/06/16 20.82 kg/m  10/25/16 21.63 kg/m  06/14/16 21.63 kg/m   Wt Readings from Last 4 Encounters:  11/19/16 129 lb (58.5 kg)  11/06/16 129 lb (58.5 kg)  10/25/16 134 lb (60.8 kg)  06/14/16 134 lb (60.8  kg)  Psych/Mental status: Alert, oriented x 3 (person, place, & time)       Eyes: PERLA Respiratory: No evidence of acute respiratory distress  Cervical Spine Exam  Inspection: No masses, redness, or swelling Alignment: Symmetrical Functional ROM: Unrestricted ROM      Stability: No instability detected Muscle strength & Tone: Functionally intact Sensory: Unimpaired Palpation: No palpable anomalies              Upper Extremity (UE) Exam    Side: Right upper extremity  Side: Left upper extremity  Inspection: No masses, redness, swelling, or asymmetry. No contractures  Inspection: No masses, redness, swelling, or asymmetry. No contractures  Functional ROM: Unrestricted ROM          Functional ROM: Unrestricted ROM          Muscle strength & Tone: Functionally intact  Muscle strength & Tone: Functionally intact  Sensory: Unimpaired  Sensory: Unimpaired  Palpation: No palpable anomalies              Palpation: No palpable anomalies              Specialized  Test(s): Deferred         Specialized Test(s): Deferred          Thoracic Spine Exam  Inspection: No masses, redness, or swelling Alignment: Symmetrical Functional ROM: Unrestricted ROM Stability: No instability detected Sensory: Unimpaired Muscle strength & Tone: No palpable anomalies  Lumbar Spine Exam  Inspection: No masses, redness, or swelling Alignment: Symmetrical Functional ROM: Unrestricted ROM      Stability: No instability detected Muscle strength & Tone: Functionally intact Sensory: Unimpaired Palpation: No palpable anomalies       Provocative Tests: Lumbar Hyperextension and rotation test: evaluation deferred today       Patrick's Maneuver: evaluation deferred today                    Gait & Posture Assessment  Ambulation: Unassisted Gait: Relatively normal for age and body habitus Posture: WNL   Lower Extremity Exam    Side: Right lower extremity  Side: Left lower extremity  Inspection: No masses, redness, swelling, or asymmetry. No contractures  Inspection: No masses, redness, swelling, or asymmetry. No contractures  Functional ROM: Unrestricted ROM          Functional ROM: Unrestricted ROM          Muscle strength & Tone: Functionally intact  Muscle strength & Tone: Functionally intact  Sensory: Unimpaired  Sensory: Unimpaired  Palpation: No palpable anomalies  Palpation: No palpable anomalies   Assessment  Primary Diagnosis & Pertinent Problem List: The primary encounter diagnosis was Chronic low back pain (Bilateral) (R>L). Diagnoses of Chronic lower extremity pain (Right), Chronic lumbar radicular pain (Right), Chronic sacroiliac joint pain (Right), Lumbar facet syndrome (Bilateral) (R>L), Spinal stenosis of lumbar region with neurogenic claudication, Lumbar spondylosis, Long term prescription opiate use, Opiate use (15 MME/Day), Chronic pain syndrome, and Bilateral hearing loss, unspecified hearing loss type were also pertinent to this visit.  Status  Diagnosis  Controlled Controlled Controlled 1. Chronic low back pain (Bilateral) (R>L)   2. Chronic lower extremity pain (Right)   3. Chronic lumbar radicular pain (Right)   4. Chronic sacroiliac joint pain (Right)   5. Lumbar facet syndrome (Bilateral) (R>L)   6. Spinal stenosis of lumbar region with neurogenic claudication   7. Lumbar spondylosis   8. Long term prescription opiate use   9. Opiate use (15 MME/Day)  10. Chronic pain syndrome   11. Bilateral hearing loss, unspecified hearing loss type      Plan of Care  Pharmacotherapy (Medications Ordered): Meds ordered this encounter  Medications  . traMADol (ULTRAM) 50 MG tablet    Sig: Take 1 tablet (50 mg total) by mouth 3 (three) times daily as needed for severe pain.    Dispense:  90 tablet    Refill:  5    Do not place this medication, or any other prescription from our practice, on "Automatic Refill". Patient may have prescription filled one day early if pharmacy is closed on scheduled refill date.   New Prescriptions   No medications on file   Medications administered today: Ms. Jahr had no medications administered during this visit. Lab-work, procedure(s), and/or referral(s): No orders of the defined types were placed in this encounter.  Imaging and/or referral(s): None  Interventional therapies: Planned, scheduled, and/or pending:   Not at this time.   Considering:   Diagnostic bilateral lumbar facet block #3  Possible bilateral lumbar facet radiofrequency ablation.  Diagnostic right-sided intra-articular hip joint injection  Palliative right-sided L34 lumbar epidural steroid injection  Diagnostic right-sided transforaminal L3-4 epidural steroid injection  Right-sided trochanteric bursa injection  Diagnostic right-sided sacroiliac joint block    Palliative PRN treatment(s):   Diagnostic bilateral lumbar facet block #3  Diagnostic right-sided intra-articular hip joint injection  Palliative  right-sided L34 lumbar epidural steroid injection  Diagnostic right-sided transforaminal L3-4 epidural steroid injection  Right-sided trochanteric bursa injection  Diagnostic right-sided sacroiliac joint block    Provider-requested follow-up: Return in about 6 months (around 05/21/2017) for Med-Mgmt, w/ NP, in addition, PRN Procedure(s), by MD.  Future Appointments Date Time Provider [place]  11/22/2016 9:00 AM [place] None  05/02/2017 3:00 PM Olin Hauser, DO [place] None  05/21/2017 10:30 AM [place], NP [facility] None   Primary Care Physician: Olin Hauser, DO Location: [facility] Facility Note by: Kathlen Brunswick. Dossie Arbour, M.D, DABA, DABAPM, DABPM, DABIPP, FIPP Date: 11/19/2016; Time: 1:06 PM  Patient instructions provided during this appointment: Patient Instructions   Pain Score  Introduction: The pain score used by this practice is the Verbal Numerical Rating Scale (VNRS-11). This is an 11-point scale. It is for adults and children 10 years or older. There are significant differences in how the pain score is reported, used, and applied. Forget everything you learned in the past and learn this scoring system.  General Information: The scale should reflect your current level of pain. Unless you are specifically asked for the level of your worst pain, or your average pain. If you are asked for one of these two, then it should be understood that it is over the past 24 hours.  Basic Activities of Daily Living (ADL): Personal hygiene, dressing, eating, transferring, and using restroom.  Instructions: Most patients tend to report their level of pain as a combination of two factors, their physical pain and their psychosocial pain. This last one is also known as "suffering" and it is reflection of how physical pain affects you socially and psychologically. From now on, report them separately. From this point on, when  asked to report your pain level, report only your physical pain. Use the following table for reference.  Pain Clinic Pain Levels (0-5/10)  Pain Level Score Description  No Pain 0   Mild pain 1 Nagging, annoying, but does not interfere with basic activities of daily living (ADL). Patients are able to  eat, bathe, get dressed, toileting (being able to get on and off the toilet and perform personal hygiene functions), transfer (move in and out of bed or a chair without assistance), and maintain continence (able to control bladder and bowel functions). Blood pressure and heart rate are unaffected. A normal heart rate for a healthy adult ranges from 60 to 100 bpm (beats per minute).   Mild to moderate pain 2 Noticeable and distracting. Impossible to hide from other people. More frequent flare-ups. Still possible to adapt and function close to normal. It can be very annoying and may have occasional stronger flare-ups. With discipline, patients may get used to it and adapt.   Moderate pain 3 Interferes significantly with activities of daily living (ADL). It becomes difficult to feed, bathe, get dressed, get on and off the toilet or to perform personal hygiene functions. Difficult to get in and out of bed or a chair without assistance. Very distracting. With effort, it can be ignored when deeply involved in activities.   Moderately severe pain 4 Impossible to ignore for more than a few minutes. With effort, patients may still be able to manage work or participate in some social activities. Very difficult to concentrate. Signs of autonomic nervous system discharge are evident: dilated pupils (mydriasis); mild sweating (diaphoresis); sleep interference. Heart rate becomes elevated (>115 bpm). Diastolic blood pressure (lower number) rises above 100 mmHg. Patients find relief in laying down and not moving.   Severe pain 5 Intense and extremely unpleasant. Associated with frowning face and frequent crying. Pain  overwhelms the senses.  Ability to do any activity or maintain social relationships becomes significantly limited. Conversation becomes difficult. Pacing back and forth is common, as getting into a comfortable position is nearly impossible. Pain wakes you up from deep sleep. Physical signs will be obvious: pupillary dilation; increased sweating; goosebumps; brisk reflexes; cold, clammy hands and feet; nausea, vomiting or dry heaves; loss of appetite; significant sleep disturbance with inability to fall asleep or to remain asleep. When persistent, significant weight loss is observed due to the complete loss of appetite and sleep deprivation.  Blood pressure and heart rate becomes significantly elevated. Caution: If elevated blood pressure triggers a pounding headache associated with blurred vision, then the patient should immediately seek attention at an urgent or emergency care unit, as these may be signs of an impending stroke.    Emergency Department Pain Levels (6-10/10)  Emergency Room Pain 6 Severely limiting. Requires emergency care and should not be seen or managed at an outpatient pain management facility. Communication becomes difficult and requires great effort. Assistance to reach the emergency department may be required. Facial flushing and profuse sweating along with potentially dangerous increases in heart rate and blood pressure will be evident.   Distressing pain 7 Self-care is very difficult. Assistance is required to transport, or use restroom. Assistance to reach the emergency department will be required. Tasks requiring coordination, such as bathing and getting dressed become very difficult.   Disabling pain 8 Self-care is no longer possible. At this level, pain is disabling. The individual is unable to do even the most "basic" activities such as walking, eating, bathing, dressing, transferring to a bed, or toileting. Fine motor skills are lost. It is difficult to think clearly.    Incapacitating pain 9 Pain becomes incapacitating. Thought processing is no longer possible. Difficult to remember your own name. Control of movement and coordination are lost.   The worst pain imaginable 10 At this level, most patients  pass out from pain. When this level is reached, collapse of the autonomic nervous system occurs, leading to a sudden drop in blood pressure and heart rate. This in turn results in a temporary and dramatic drop in blood flow to the brain, leading to a loss of consciousness. Fainting is one of the body's self defense mechanisms. Passing out puts the brain in a calmed state and causes it to shut down for a while, in order to begin the healing process.    Summary: 1. Refer to this scale when providing [place] with your pain level. 2. Be accurate and careful when reporting your pain level. This will help with your care. 3. Over-reporting your pain level will lead to loss of credibility. 4. Even a level of 1/10 means that there is pain and will be treated at our facility. 5. High, inaccurate reporting will be documented as "Symptom Exaggeration", leading to loss of credibility and suspicions of possible secondary gains such as obtaining more narcotics, or wanting to appear disabled, for fraudulent reasons. 6. Only pain levels of 5 or below will be seen at our facility. 7. Pain levels of 6 and above will be sent to the Emergency Department and the appointment cancelled. _____________________________________________________________________________________________

## 2016-11-19 NOTE — Telephone Encounter (Signed)
Pt's daughter advised and appointment scheduled.

## 2016-11-19 NOTE — Progress Notes (Signed)
Nursing Pain Medication Assessment:  Safety precautions to be maintained throughout the outpatient stay will include: orient to surroundings, keep bed in low position, maintain call bell within reach at all times, provide assistance with transfer out of bed and ambulation.  Medication Inspection Compliance: Pill count conducted under aseptic conditions, in front of the patient. Neither the pills nor the bottle was removed from the patient's sight at any time. Once count was completed pills were immediately returned to the patient in their original bottle.  Medication: Tramadol (Ultram) Pill/Patch Count: 11 of 90 pills remain Pill/Patch Appearance: Markings consistent with prescribed medication Bottle Appearance: Standard pharmacy container. Clearly labeled. Filled Date 03/26/ 2018 Last Medication intake:  Today

## 2016-11-19 NOTE — Patient Instructions (Signed)

## 2016-11-21 ENCOUNTER — Other Ambulatory Visit: Payer: Medicare Other

## 2016-11-22 ENCOUNTER — Other Ambulatory Visit: Payer: Medicare Other

## 2016-11-22 DIAGNOSIS — E782 Mixed hyperlipidemia: Secondary | ICD-10-CM | POA: Diagnosis not present

## 2016-11-22 DIAGNOSIS — D631 Anemia in chronic kidney disease: Secondary | ICD-10-CM | POA: Diagnosis not present

## 2016-11-22 DIAGNOSIS — E034 Atrophy of thyroid (acquired): Secondary | ICD-10-CM | POA: Diagnosis not present

## 2016-11-22 DIAGNOSIS — R7309 Other abnormal glucose: Secondary | ICD-10-CM | POA: Diagnosis not present

## 2016-11-22 DIAGNOSIS — I10 Essential (primary) hypertension: Secondary | ICD-10-CM | POA: Diagnosis not present

## 2016-11-22 DIAGNOSIS — N183 Chronic kidney disease, stage 3 (moderate): Secondary | ICD-10-CM | POA: Diagnosis not present

## 2016-11-22 LAB — CBC WITH DIFFERENTIAL/PLATELET
Basophils Absolute: 0 cells/uL (ref 0–200)
Basophils Relative: 0 %
Eosinophils Absolute: 0 cells/uL — ABNORMAL LOW (ref 15–500)
Eosinophils Relative: 0 %
HCT: 38.8 % (ref 35.0–45.0)
Hemoglobin: 11.9 g/dL (ref 11.7–15.5)
Lymphocytes Relative: 29 %
Lymphs Abs: 1392 cells/uL (ref 850–3900)
MCH: 24.2 pg — ABNORMAL LOW (ref 27.0–33.0)
MCHC: 30.7 g/dL — ABNORMAL LOW (ref 32.0–36.0)
MCV: 79 fL — ABNORMAL LOW (ref 80.0–100.0)
MPV: 9.8 fL (ref 7.5–12.5)
Monocytes Absolute: 624 cells/uL (ref 200–950)
Monocytes Relative: 13 %
Neutro Abs: 2784 cells/uL (ref 1500–7800)
Neutrophils Relative %: 58 %
Platelets: 171 10*3/uL (ref 140–400)
RBC: 4.91 MIL/uL (ref 3.80–5.10)
RDW: 15.3 % — ABNORMAL HIGH (ref 11.0–15.0)
WBC: 4.8 10*3/uL (ref 3.8–10.8)

## 2016-11-22 LAB — LIPID PANEL
Cholesterol: 232 mg/dL — ABNORMAL HIGH (ref <200)
HDL: 42 mg/dL — ABNORMAL LOW (ref >50)
LDL Cholesterol: 170 mg/dL — ABNORMAL HIGH (ref <100)
Total CHOL/HDL Ratio: 5.5 Ratio — ABNORMAL HIGH (ref <5.0)
Triglycerides: 99 mg/dL (ref <150)
VLDL: 20 mg/dL (ref <30)

## 2016-11-22 LAB — COMPLETE METABOLIC PANEL WITH GFR
ALT: 9 U/L (ref 6–29)
AST: 11 U/L (ref 10–35)
Albumin: 3.6 g/dL (ref 3.6–5.1)
Alkaline Phosphatase: 59 U/L (ref 33–130)
BUN: 14 mg/dL (ref 7–25)
CO2: 29 mmol/L (ref 20–31)
Calcium: 8.9 mg/dL (ref 8.6–10.4)
Chloride: 101 mmol/L (ref 98–110)
Creat: 0.86 mg/dL (ref 0.60–0.88)
GFR, Est African American: 71 mL/min (ref >=60)
GFR, Est Non African American: 61 mL/min (ref >=60)
Glucose, Bld: 92 mg/dL (ref 65–99)
Potassium: 4.2 mmol/L (ref 3.5–5.3)
Sodium: 136 mmol/L (ref 135–146)
Total Bilirubin: 0.4 mg/dL (ref 0.2–1.2)
Total Protein: 6.2 g/dL (ref 6.1–8.1)

## 2016-11-23 LAB — TSH: TSH: 0.63 mIU/L

## 2016-11-23 LAB — HEMOGLOBIN A1C
Hgb A1c MFr Bld: 5.5 % (ref <5.7)
Mean Plasma Glucose: 111 mg/dL

## 2016-11-26 ENCOUNTER — Telehealth: Payer: Self-pay

## 2016-11-26 NOTE — Telephone Encounter (Signed)
-----   Message from Olin Hauser, DO sent at 11/26/2016 10:18 AM EDT ----- Please contact patient to review the following (No MyChart Access):  1. Chemistry - Normal results, including kidney and liver function. Blood sugar normal.  2. Cholesterol - Abnormal cholesterol results. elevated LDL (bad cholesterol). Recommend taking a Fish Oil OTC 1g once or twice daily. We can discuss other cholesterol medications at next visit if needed.  3. CBC Blood Counts - Mildly anemic, similar to previous labs.  4. Hemoglobin A1c - 5.5, normal range. Below range of Pre-Diabetes < 5.7  5. TSH - Normal range. Continue current dose Levothyroxine.  Follow-up as scheduled with me in about October 2018. Will forward these lab results to patient's Nephrologist Dr Abigail Butts at [facility], and will fax to their office if needed.  Nobie Putnam, DO [facility] 11/26/2016, 10:18 AM

## 2016-11-26 NOTE — Telephone Encounter (Signed)
-----   Message from Olin Hauser, DO sent at 11/26/2016 10:18 AM EDT ----- Please contact patient to review the following (No MyChart Access):  1. Chemistry - Normal results, including kidney and liver function. Blood sugar normal.  2. Cholesterol - Abnormal cholesterol results. elevated LDL (bad cholesterol). Recommend taking a Fish Oil OTC 1g once or twice daily. We can discuss other cholesterol medications at next visit if needed.  3. CBC Blood Counts - Mildly anemic, similar to previous labs.  4. Hemoglobin A1c - 5.5, normal range. Below range of Pre-Diabetes < 5.7  5. TSH - Normal range. Continue current dose Levothyroxine.  Follow-up as scheduled with me in about October 2018. Will forward these lab results to patient's Nephrologist Dr Abigail Butts at [facility], and will fax to their office if needed.  Nobie Putnam, DO [facility] 11/26/2016, 10:18 AM

## 2016-11-26 NOTE — Telephone Encounter (Signed)
Faxed lab results to Dr. Juleen China.

## 2016-11-26 NOTE — Telephone Encounter (Signed)
Patient's daughter Sunday Shams. Arbie Cookey) advised as below. Patient verbalizes understanding and is in agreement with treatment plan.

## 2016-11-29 ENCOUNTER — Other Ambulatory Visit: Payer: Self-pay | Admitting: Family Medicine

## 2016-11-29 DIAGNOSIS — I10 Essential (primary) hypertension: Secondary | ICD-10-CM

## 2016-11-29 MED ORDER — METOPROLOL SUCCINATE ER 100 MG PO TB24: 100.0000 mg | ORAL_TABLET | Freq: Every day | ORAL | 11 refills | Status: DC

## 2016-11-29 NOTE — Telephone Encounter (Signed)
Called due to request of Cipro refill. Recent office visit 09/2016, gave her Cipro as preventative 500mg  BID for 5 days only if recurrent UTI, has refills, use for prophylaxis with good results.  Called and spoke with daughter, primary caregiver, [name]. She states that this was the INCORRECT med that was requested to be refilled, she meant to request TOPROL, not Cipro. She has not taken any of the refills of Cipro yet and doing well with that no more UTI. Upon further review request was for Metoprolol Succinate XL 100mg  BID #60 pills, that was rx by previous PCP Dr Luan Pulling, chart review shows in past she has been on succinate for several years, previously 2016 she was given ONCE daily, I do not see indication for TWICE daily on XL medication, especially in age 83 year old patient. I advised her of this, likely prior error, and that we should switch to ONCE daily on XL or twice daily on tartrate fast acting med.  Sent rx refill Metoprolol succinate XL 100mg  DAILY #30, +11 refills to [facility]. And Refused request for Cipro incorrect refill.  Nobie Putnam, [facility] 11/29/2016, 4:10 PM

## 2016-11-29 NOTE — Telephone Encounter (Signed)
Last ov  10/25/16 Last filled 10/25/16

## 2016-12-02 ENCOUNTER — Other Ambulatory Visit: Payer: Self-pay

## 2016-12-02 DIAGNOSIS — I10 Essential (primary) hypertension: Secondary | ICD-10-CM

## 2016-12-02 NOTE — Telephone Encounter (Signed)
Last ov  10/25/16 Last filled on 11/29/16 metoprolol 100 mg was sent in only for #30 and patient takes 1 tablet BID.

## 2017-01-01 ENCOUNTER — Emergency Department
Admission: EM | Admit: 2017-01-01 | Discharge: 2017-01-01 | Disposition: A | Discharge status: Home / Self Care | Payer: Medicare Other | Attending: Emergency Medicine | Admitting: Emergency Medicine

## 2017-01-01 ENCOUNTER — Emergency Department: Payer: Medicare Other

## 2017-01-01 ENCOUNTER — Encounter: Payer: Self-pay | Admitting: Emergency Medicine

## 2017-01-01 DIAGNOSIS — Y998 Other external cause status: Secondary | ICD-10-CM | POA: Diagnosis not present

## 2017-01-01 DIAGNOSIS — N183 Chronic kidney disease, stage 3 (moderate): Secondary | ICD-10-CM | POA: Diagnosis not present

## 2017-01-01 DIAGNOSIS — S3992XA Unspecified injury of lower back, initial encounter: Secondary | ICD-10-CM | POA: Diagnosis present

## 2017-01-01 DIAGNOSIS — Z7982 Long term (current) use of aspirin: Secondary | ICD-10-CM | POA: Insufficient documentation

## 2017-01-01 DIAGNOSIS — S3993XA Unspecified injury of pelvis, initial encounter: Secondary | ICD-10-CM | POA: Diagnosis not present

## 2017-01-01 DIAGNOSIS — R102 Pelvic and perineal pain: Secondary | ICD-10-CM | POA: Diagnosis not present

## 2017-01-01 DIAGNOSIS — Z79899 Other long term (current) drug therapy: Secondary | ICD-10-CM | POA: Diagnosis not present

## 2017-01-01 DIAGNOSIS — W010XXA Fall on same level from slipping, tripping and stumbling without subsequent striking against object, initial encounter: Secondary | ICD-10-CM | POA: Diagnosis not present

## 2017-01-01 DIAGNOSIS — I129 Hypertensive chronic kidney disease with stage 1 through stage 4 chronic kidney disease, or unspecified chronic kidney disease: Secondary | ICD-10-CM | POA: Insufficient documentation

## 2017-01-01 DIAGNOSIS — I10 Essential (primary) hypertension: Secondary | ICD-10-CM

## 2017-01-01 DIAGNOSIS — I714 Abdominal aortic aneurysm, without rupture: Secondary | ICD-10-CM | POA: Insufficient documentation

## 2017-01-01 DIAGNOSIS — S32000A Wedge compression fracture of unspecified lumbar vertebra, initial encounter for closed fracture: Secondary | ICD-10-CM

## 2017-01-01 DIAGNOSIS — E039 Hypothyroidism, unspecified: Secondary | ICD-10-CM | POA: Diagnosis not present

## 2017-01-01 DIAGNOSIS — Y9389 Activity, other specified: Secondary | ICD-10-CM | POA: Diagnosis not present

## 2017-01-01 DIAGNOSIS — G894 Chronic pain syndrome: Secondary | ICD-10-CM

## 2017-01-01 DIAGNOSIS — M549 Dorsalgia, unspecified: Secondary | ICD-10-CM | POA: Diagnosis not present

## 2017-01-01 DIAGNOSIS — Y9289 Other specified places as the place of occurrence of the external cause: Secondary | ICD-10-CM | POA: Insufficient documentation

## 2017-01-01 DIAGNOSIS — M8008XA Age-related osteoporosis with current pathological fracture, vertebra(e), initial encounter for fracture: Secondary | ICD-10-CM | POA: Diagnosis not present

## 2017-01-01 DIAGNOSIS — S32020A Wedge compression fracture of second lumbar vertebra, initial encounter for closed fracture: Secondary | ICD-10-CM | POA: Diagnosis not present

## 2017-01-01 DIAGNOSIS — M545 Low back pain: Secondary | ICD-10-CM | POA: Diagnosis not present

## 2017-01-01 MED ORDER — HYDROCODONE-ACETAMINOPHEN 5-325 MG PO TABS: 1.0000 | ORAL_TABLET | ORAL | 0 refills | Status: DC | PRN

## 2017-01-01 MED ORDER — HYDROCODONE-ACETAMINOPHEN 5-325 MG PO TABS
1.0000 | ORAL_TABLET | Freq: Once | ORAL | Status: AC
  Administered 2017-01-01: 1 via ORAL
  Filled 2017-01-01: qty 1

## 2017-01-01 MED ORDER — TRAMADOL HCL 50 MG PO TABS: 50.0000 mg | ORAL_TABLET | Freq: Three times a day (TID) | ORAL | 0 refills | Status: DC | PRN

## 2017-01-01 MED ORDER — CYCLOBENZAPRINE HCL 10 MG PO TABS
5.0000 mg | ORAL_TABLET | Freq: Once | ORAL | Status: AC
  Administered 2017-01-01: 5 mg via ORAL
  Filled 2017-01-01: qty 1

## 2017-01-01 MED ORDER — LISINOPRIL 10 MG PO TABS
40.0000 mg | ORAL_TABLET | Freq: Once | ORAL | Status: AC
  Administered 2017-01-01: 40 mg via ORAL
  Filled 2017-01-01: qty 4

## 2017-01-01 MED ORDER — CYCLOBENZAPRINE HCL 5 MG PO TABS: 5.0000 mg | ORAL_TABLET | Freq: Three times a day (TID) | ORAL | 0 refills | Status: DC | PRN

## 2017-01-01 MED ORDER — AMLODIPINE BESYLATE 5 MG PO TABS
5.0000 mg | ORAL_TABLET | Freq: Once | ORAL | Status: AC
  Administered 2017-01-01: 5 mg via ORAL
  Filled 2017-01-01: qty 1

## 2017-01-01 NOTE — Discharge Instructions (Signed)
Take tylenol for pain.   Take tramadol for mild pain.   Take flexeril for muscle spasms.   Take vicodin ONLY if you have pain after taking tramadol and flexeril. It may get you drowsy   Your blood pressure is elevated, take your meds as prescribed.   See your doctor in 2-3 days   Return to ER if you have worse back pain, abdominal pain, weakness, numbness, unable to urinate

## 2017-01-01 NOTE — ED Triage Notes (Signed)
Pt arrived via EMS from home for reports of fall in bathroom. Pt reports low back pain. Pt denies dizziness prior to fall. No LOC. Pt alert and oriented on arrival. EMS reports VSS.

## 2017-01-01 NOTE — ED Notes (Signed)
EDP at bedside  

## 2017-01-01 NOTE — ED Notes (Signed)
Pt ambulated in hallway with no assist

## 2017-01-01 NOTE — ED Provider Notes (Signed)
[facility] Provider Note   CSN: [id number] Arrival date & time: 01/01/17  0813     History   Chief Complaint Chief Complaint  Patient presents with  . Back Pain    HPI Brittney Simpson is a 83 y.o. female history of hyperlipidemia, hypertension, aortic aneurysm here presenting with fall. Patient states that she was in the restroom today and her leg gave out and she had a mechanical fall and fell and hit her back. She denies any head injury or loss of consciousness or passing out. She states that she was able to crawl over and call to get help. She came by EMS and not noticed to have any obvious deformities on exam. Patient is not on blood thinners. Of note, patient had a CT abdomen pelvis in November last year that showed constipation and a 3 cm AAA but she denies any abdominal pain currently and her last bowel movement was yesterday.   The history is provided by the patient.    Past Medical History:  Diagnosis Date  . Anxiety   . Back ache   . Hiatal hernia   . Hyperlipidemia   . Hypertension   . Hypothyroid   . Osteopenia   . Thyroid disease   . UTI (urinary tract infection)     Patient Active Problem List   Diagnosis Date Noted  . Bilateral hearing loss 11/19/2016  . Hyperlipidemia 11/18/2016  . Abnormal glucose 11/18/2016  . Recurrent UTI 10/25/2016  . GERD (gastroesophageal reflux disease) 10/25/2016  . Presbycusis of both ears 10/25/2016  . Lumbar spondylosis 05/14/2016  . Hypothyroidism 04/23/2016  . Essential hypertension 10/11/2015  . CKD (chronic kidney disease) stage 3, GFR 30-59 ml/min 10/11/2015  . Skin lesion of scalp 10/11/2015  . Chronic anxiety 10/11/2015  . Insomnia 10/11/2015  . Spondylolisthesis of lumbosacral region (L2-3 and L5-S1) 08/22/2015  . Lumbar spinal stenosis (9 mm at L3-4) 08/22/2015  . Trochanteric bursitis (Right) 08/22/2015  . Chronic hip pain (Right) 08/22/2015  . Osteoarthritis of hip (Right) 08/22/2015  .  Chronic sacroiliac joint pain (Right) 08/22/2015  . Chronic lumbar radicular pain (Right) 08/22/2015  . Chronic lower extremity pain (Right) 08/22/2015  . Myofascial pain 08/22/2015  . Encounter for chronic pain management 08/22/2015  . Chronic low back pain (Bilateral) (R>L) 06/03/2015  . Chronic pain 05/24/2015  . Long term current use of opiate analgesic 05/24/2015  . Long term prescription opiate use 05/24/2015  . Opiate use (15 MME/Day) 05/24/2015  . Encounter for therapeutic drug level monitoring 05/24/2015  . Lumbar facet syndrome (Bilateral) (R>L) 05/24/2015    Past Surgical History:  Procedure Laterality Date  . HEMORRHOID SURGERY    . HERNIA REPAIR    . HERNIA REPAIR      OB History    No data available       Home Medications    Prior to Admission medications   Medication Sig Start Date End Date Taking? Authorizing Provider  amLODipine (NORVASC) 5 MG tablet Take 1 tablet (5 mg total) by mouth daily. 04/23/16  Yes Arlis Porta., MD  brimonidine [facility]) 0.2 % ophthalmic solution Place 1 drop into the left eye 2 (two) times daily.  08/24/15  Yes [provider]  clonazePAM (KLONOPIN) 0.5 MG tablet TAKE 1/2 TABLET BY MOUTH TWICE DAILY FOR ANXIETY. 10/25/16  Yes Karamalegos, Devonne Doughty, DO  Ergocalciferol (VITAMIN D2) 2000 UNITS TABS Take 1 capsule by mouth.   Yes [provider]  furosemide (LASIX)  20 MG tablet Take 1 tablet (20 mg total) by mouth daily. 04/23/16  Yes Arlis Porta., MD  latanoprost (XALATAN) 0.005 % ophthalmic solution Place 1 drop into the left eye at bedtime.  09/22/15  Yes [provider]  levothyroxine (SYNTHROID, LEVOTHROID) 125 MCG tablet TAKE 1 TABLET BY MOUTH ONCE DAILY ON AN EMPTY STOMACH. WAIT 30 MINUTES BEFORE TAKING OTHER MEDS. 04/23/16  Yes Arlis Porta., MD  lisinopril (PRINIVIL,ZESTRIL) 40 MG tablet Take 1 tablet (40 mg total) by mouth daily. 04/23/16  Yes Arlis Porta., MD  metoprolol  succinate (TOPROL-XL) 100 MG 24 hr tablet Take 1 tablet (100 mg total) by mouth daily. Take with or immediately following a meal. 11/29/16  Yes Karamalegos, Devonne Doughty, DO  omeprazole (PRILOSEC) 20 MG capsule Take 1 capsule (20 mg total) by mouth daily. 10/25/16  Yes Karamalegos, Devonne Doughty, DO  timolol (TIMOPTIC) 0.5 % ophthalmic solution Place 1 drop into the left eye 2 (two) times daily.  09/05/16  Yes [provider]  traMADol (ULTRAM) 50 MG tablet Take 1 tablet (50 mg total) by mouth 3 (three) times daily as needed for severe pain. 12/01/16 05/30/17 Yes Milinda Pointer, MD  traZODone (DESYREL) 50 MG tablet Take 1 tablet (50 mg total) by mouth at bedtime. 10/25/16  Yes Karamalegos, Devonne Doughty, DO    Family History Family History  Problem Relation Age of Onset  . Stroke Mother   . Cancer Father     Social History Social History  Substance Use Topics  . Smoking status: Never Smoker  . Smokeless tobacco: Never Used  . Alcohol use No     Allergies   Patient has no known allergies.   Review of Systems Review of Systems  Musculoskeletal: Positive for back pain.  All other systems reviewed and are negative.    Physical Exam Updated Vital Signs BP (!) 174/67   Pulse 79   Temp 98.3 F (36.8 C) (Oral)   Resp 18   Ht 5\' 6"  (1.676 m)   Wt 58.9 kg (129 lb 14.4 oz)   SpO2 97%   BMI 20.97 kg/m   Physical Exam  Constitutional:  Slightly uncomfortable   HENT:  Head: Normocephalic.  Mouth/Throat: Oropharynx is clear and moist.  Eyes: EOM are normal. Pupils are equal, round, and reactive to light.  Neck: Normal range of motion. Neck supple.  Cardiovascular: Normal rate, regular rhythm and normal heart sounds.   Pulmonary/Chest: Effort normal and breath sounds normal. No respiratory distress. She has no wheezes. She has no rales.  Abdominal: Soft. Bowel sounds are normal. She exhibits no distension. There is no tenderness.  Musculoskeletal:  Lower lumbar tenderness,  no obvious deformity. Pelvis stable, nl ROM bilateral hips. 2+ pulses throughout   Skin: Skin is warm.  Psychiatric: She has a normal mood and affect. Her behavior is normal.  Nursing note and vitals reviewed.    ED Treatments / Results  Labs (all labs ordered are listed, but only abnormal results are displayed) Labs Reviewed - No data to display  EKG  EKG Interpretation None       Radiology Dg Lumbar Spine Complete  Result Date: 01/01/2017 CLINICAL DATA:  Pt arrived via EMS from home for reports of fall in bathroom. Pt states she was getting up from the toilet and not sure what happened but she fell back and hit her back on the toilet; Pt reports low back pain radiating into left leg. EXAM: LUMBAR SPINE -  COMPLETE 4+ VIEW COMPARISON:  CT abdomen pelvis- 06/14/2016; pelvic radiographs-earlier same day FINDINGS: There are 5 non rib-bearing lumbar type vertebral bodies. There is a mild scoliotic curvature of the thoracolumbar spine with dominant caudal component convex to the right measuring approximately 10 degrees (as measured from the superior endplate of W09 to the inferior endplate of L4. Re- demonstrated grade 2 anterolisthesis measuring approximately 1.4 cm with associated bilateral L5 pars defects, similar to abdominal CT performed 06/14/2016. There is un chain mild straightening expected lumbar lordosis. Mild (approximately 25%) compression deformity involving the superior endplate of the W11 vertebral body. Remaining lumbar vertebral body heights appear preserved. Moderate severe multilevel lumbar spine DDD, worse at L2-L3 with disc space height loss, endplate irregularity and sclerosis. Irregular atherosclerotic plaque compatible with known abdominal aortic aneurysm. Large colonic stool burden. Several phleboliths overlie the lower pelvis. IMPRESSION: 1. Mild (approximately 25%) compression deformity involving the superior endplate of B14, new compared to abdominal CT performed  06/14/2016. Correlation for point tenderness at this location is recommended. 2. Moderate severe multilevel lumbar spine DDD, worse at L2-L3. 3. Aortic aneurysm NOS (ICD10-I71.9), as demonstrated on abdominal CT performed 05/2016. 4.  Aortic Atherosclerosis (ICD10-I70.0). Electronically Signed   By: Sandi Mariscal M.D.   On: 01/01/2017 08:55   Dg Pelvis 1-2 Views  Result Date: 01/01/2017 CLINICAL DATA:  Pt arrived via EMS from home for reports of fall in bathroom. Pt states she was getting up from the toilet and not sure what happened but she fell back and hit her back on the toilet; Pt reports low back pain radiating into left leg. EXAM: PELVIS - 1-2 VIEW COMPARISON:  Lumbar spine radiographs-earlier same day FINDINGS: No definite displaced pelvic fracture. Suspected mild degenerative change of the bilateral hips with joint space loss, subchondral sclerosis and osteophytosis, incompletely evaluated. A minimal amount of chondrocalcinosis is noted within the pubic symphysis. Limited visualization the bilateral SI joints is normal. Several phleboliths overlie the lower pelvis bilaterally. Large colonic stool burden. IMPRESSION: No displaced pelvic fracture. Electronically Signed   By: Sandi Mariscal M.D.   On: 01/01/2017 08:57    Procedures Procedures (including critical care time)  EMERGENCY DEPARTMENT ULTRASOUND  Study: Limited Retroperitoneal Ultrasound of the Abdominal Aorta.  INDICATIONS:Age>55 Multiple views of the abdominal aorta were obtained in real-time from the diaphragmatic hiatus to the aortic bifurcation in transverse planes with a multi-frequency probe.  PERFORMED BY: Myself IMAGES ARCHIVED?: Yes LIMITATIONS:  Body habitus INTERPRETATION:  Abdominal aortic aneurysm present - diameter dimensions 3 cm , unchanged since CT several months ago     Medications Ordered in ED Medications  HYDROcodone-acetaminophen (NORCO/VICODIN) 5-325 MG per tablet 1 tablet (1 tablet Oral Given 01/01/17 0825)    cyclobenzaprine (FLEXERIL) tablet 5 mg (5 mg Oral Given 01/01/17 0825)  amLODipine (NORVASC) tablet 5 mg (5 mg Oral Given 01/01/17 0922)  lisinopril (PRINIVIL,ZESTRIL) tablet 40 mg (40 mg Oral Given 01/01/17 7829)     Initial Impression / Assessment and Plan / ED Course  I have reviewed the triage vital signs and the nursing notes.  Pertinent labs & imaging results that were available during my care of the patient were reviewed by me and considered in my medical decision making (see chart for details).     Brittney Simpson is a 83 y.o. female here with back pain s/p fall. Likely muscle strain. Will get xrays and give pain meds. Hypertensive in the ED but hx of HTN. Has known 3 cm AAA but I  doubt AAA rupture or dissection.   10:35 AM Bedside US confirmed AAA. Good peripheral pulses and denies numbness or weakness in the legs. Pain improved with vicodin, flexeril. Ambulated in the ED. Didn't take morning BP meds and given home meds and BP stable and she felt better. Xray showed possible compression fracture likely causing her pain. No need for MRI currently. Will dc home.   Final Clinical Impressions(s) / ED Diagnoses   Final diagnoses:  None    New Prescriptions New Prescriptions   No medications on file     Drenda Freeze, MD 01/01/17 1036

## 2017-01-01 NOTE — ED Notes (Signed)
Pt alert and oriented X4, active, cooperative, pt in NAD. RR even and unlabored, color WNL.  Pt informed to return if any life threatening symptoms occur.   

## 2017-01-04 ENCOUNTER — Emergency Department
Admission: EM | Admit: 2017-01-04 | Discharge: 2017-01-04 | Disposition: A | Discharge status: Home / Self Care | Payer: Medicare Other | Attending: Emergency Medicine | Admitting: Emergency Medicine

## 2017-01-04 DIAGNOSIS — S39012D Strain of muscle, fascia and tendon of lower back, subsequent encounter: Secondary | ICD-10-CM

## 2017-01-04 DIAGNOSIS — Y999 Unspecified external cause status: Secondary | ICD-10-CM | POA: Insufficient documentation

## 2017-01-04 DIAGNOSIS — I129 Hypertensive chronic kidney disease with stage 1 through stage 4 chronic kidney disease, or unspecified chronic kidney disease: Secondary | ICD-10-CM | POA: Insufficient documentation

## 2017-01-04 DIAGNOSIS — Y939 Activity, unspecified: Secondary | ICD-10-CM | POA: Diagnosis not present

## 2017-01-04 DIAGNOSIS — Z79899 Other long term (current) drug therapy: Secondary | ICD-10-CM | POA: Diagnosis not present

## 2017-01-04 DIAGNOSIS — N183 Chronic kidney disease, stage 3 (moderate): Secondary | ICD-10-CM | POA: Diagnosis not present

## 2017-01-04 DIAGNOSIS — W19XXXA Unspecified fall, initial encounter: Secondary | ICD-10-CM | POA: Diagnosis not present

## 2017-01-04 DIAGNOSIS — M545 Low back pain, unspecified: Secondary | ICD-10-CM

## 2017-01-04 DIAGNOSIS — Y929 Unspecified place or not applicable: Secondary | ICD-10-CM | POA: Insufficient documentation

## 2017-01-04 DIAGNOSIS — E039 Hypothyroidism, unspecified: Secondary | ICD-10-CM | POA: Diagnosis not present

## 2017-01-04 DIAGNOSIS — S39012A Strain of muscle, fascia and tendon of lower back, initial encounter: Secondary | ICD-10-CM | POA: Insufficient documentation

## 2017-01-04 DIAGNOSIS — W1830XA Fall on same level, unspecified, initial encounter: Secondary | ICD-10-CM | POA: Insufficient documentation

## 2017-01-04 DIAGNOSIS — S3992XA Unspecified injury of lower back, initial encounter: Secondary | ICD-10-CM | POA: Diagnosis present

## 2017-01-04 MED ORDER — CYCLOBENZAPRINE HCL 5 MG PO TABS: 5.0000 mg | ORAL_TABLET | Freq: Three times a day (TID) | ORAL | 0 refills | Status: DC | PRN

## 2017-01-04 MED ORDER — LIDOCAINE 5 % EX PTCH: 1.0000 | MEDICATED_PATCH | Freq: Two times a day (BID) | CUTANEOUS | 0 refills | Status: DC

## 2017-01-04 NOTE — ED Provider Notes (Signed)
[facility] Emergency Department Provider Note  ____________________________________________  Time seen: Approximately 1:09 PM  I have reviewed the triage vital signs and the nursing notes.   HISTORY  Chief Complaint Back Pain    HPI Brittney Simpson is a 83 y.o. female who arrives via EMS due to left lower back pain. The pain has been there for the past 2 or 3 days, started after a mechanical fall she had at home. She was evaluated in the ER after that fall where she had x-rays of her lumbar spine and pelvis which were essentially unremarkable. Pain was improved with Flexeril and hydrocodone and she was discharged home.  She reports the pain has worsened gradually over the last 2 days. No new falls or other injuries. Feels that in the left lower back, nonradiating. Worse with movement. No leg weakness or paresthesias. No abdominal pain. No dizziness chest pain shortness of breath or syncope.     Past Medical History:  Diagnosis Date  . Anxiety   . Back ache   . Hiatal hernia   . Hyperlipidemia   . Hypertension   . Hypothyroid   . Osteopenia   . Thyroid disease   . UTI (urinary tract infection)      Patient Active Problem List   Diagnosis Date Noted  . Bilateral hearing loss 11/19/2016  . Hyperlipidemia 11/18/2016  . Abnormal glucose 11/18/2016  . Recurrent UTI 10/25/2016  . GERD (gastroesophageal reflux disease) 10/25/2016  . Presbycusis of both ears 10/25/2016  . Lumbar spondylosis 05/14/2016  . Hypothyroidism 04/23/2016  . Essential hypertension 10/11/2015  . CKD (chronic kidney disease) stage 3, GFR 30-59 ml/min 10/11/2015  . Skin lesion of scalp 10/11/2015  . Chronic anxiety 10/11/2015  . Insomnia 10/11/2015  . Spondylolisthesis of lumbosacral region (L2-3 and L5-S1) 08/22/2015  . Lumbar spinal stenosis (9 mm at L3-4) 08/22/2015  . Trochanteric bursitis (Right) 08/22/2015  . Chronic hip pain (Right) 08/22/2015  . Osteoarthritis of  hip (Right) 08/22/2015  . Chronic sacroiliac joint pain (Right) 08/22/2015  . Chronic lumbar radicular pain (Right) 08/22/2015  . Chronic lower extremity pain (Right) 08/22/2015  . Myofascial pain 08/22/2015  . Encounter for chronic pain management 08/22/2015  . Chronic low back pain (Bilateral) (R>L) 06/03/2015  . Chronic pain 05/24/2015  . Long term current use of opiate analgesic 05/24/2015  . Long term prescription opiate use 05/24/2015  . Opiate use (15 MME/Day) 05/24/2015  . Encounter for therapeutic drug level monitoring 05/24/2015  . Lumbar facet syndrome (Bilateral) (R>L) 05/24/2015     Past Surgical History:  Procedure Laterality Date  . HEMORRHOID SURGERY    . HERNIA REPAIR    . HERNIA REPAIR       Prior to Admission medications   Medication Sig Start Date End Date Taking? Authorizing Provider  amLODipine (NORVASC) 5 MG tablet Take 1 tablet (5 mg total) by mouth daily. 04/23/16   Arlis Porta., MD  brimonidine (ALPHAGAN) 0.2 % ophthalmic solution Place 1 drop into the left eye 2 (two) times daily.  08/24/15   [provider]  clonazePAM (KLONOPIN) 0.5 MG tablet TAKE 1/2 TABLET BY MOUTH TWICE DAILY FOR ANXIETY. 10/25/16   Karamalegos, Devonne Doughty, DO  cyclobenzaprine (FLEXERIL) 5 MG tablet Take 1 tablet (5 mg total) by mouth 3 (three) times daily as needed for muscle spasms. 01/01/17   Drenda Freeze, MD  Ergocalciferol (VITAMIN D2) 2000 UNITS TABS Take 1 capsule by mouth.    [provider]  furosemide (LASIX) 20 MG tablet Take 1 tablet (20 mg total) by mouth daily. 04/23/16   Arlis Porta., MD  HYDROcodone-acetaminophen (NORCO) 5-325 MG tablet Take 1 tablet by mouth every 4 (four) hours as needed for moderate pain. 01/01/17   Drenda Freeze, MD  latanoprost (XALATAN) 0.005 % ophthalmic solution Place 1 drop into the left eye at bedtime.  09/22/15   [provider]  levothyroxine (SYNTHROID, LEVOTHROID) 125 MCG tablet TAKE 1 TABLET  BY MOUTH ONCE DAILY ON AN EMPTY STOMACH. WAIT 30 MINUTES BEFORE TAKING OTHER MEDS. 04/23/16   Arlis Porta., MD  lidocaine (LIDODERM) 5 % Place 1 patch onto the skin every 12 (twelve) hours. Remove & Discard patch within 12 hours or as directed by MD 01/04/17   Carrie Mew, MD  lisinopril (PRINIVIL,ZESTRIL) 40 MG tablet Take 1 tablet (40 mg total) by mouth daily. 04/23/16   Arlis Porta., MD  metoprolol succinate (TOPROL-XL) 100 MG 24 hr tablet Take 1 tablet (100 mg total) by mouth daily. Take with or immediately following a meal. 11/29/16   Karamalegos, Devonne Doughty, DO  omeprazole (PRILOSEC) 20 MG capsule Take 1 capsule (20 mg total) by mouth daily. 10/25/16   Karamalegos, Devonne Doughty, DO  timolol (TIMOPTIC) 0.5 % ophthalmic solution Place 1 drop into the left eye 2 (two) times daily.  09/05/16   [provider]  traMADol (ULTRAM) 50 MG tablet Take 1 tablet (50 mg total) by mouth 3 (three) times daily as needed for severe pain. 01/01/17 06/30/17  Drenda Freeze, MD  traZODone (DESYREL) 50 MG tablet Take 1 tablet (50 mg total) by mouth at bedtime. 10/25/16   Olin Hauser, DO     Allergies Patient has no known allergies.   Family History  Problem Relation Age of Onset  . Stroke Mother   . Cancer Father     Social History Social History  Substance Use Topics  . Smoking status: Never Smoker  . Smokeless tobacco: Never Used  . Alcohol use No    Review of Systems  Constitutional:   No fever or chills.  ENT:   No sore throat. No rhinorrhea. Cardiovascular:   No chest pain or syncope. Respiratory:   No dyspnea or cough. Gastrointestinal:   Negative for abdominal pain, vomiting and diarrhea.  Musculoskeletal:   Positive left lower back pain as above All other systems reviewed and are negative except as documented above in ROS and HPI.  ____________________________________________   PHYSICAL EXAM:  VITAL SIGNS: ED Triage Vitals  Enc Vitals Group      BP 01/04/17 1247 (!) 159/80     Pulse Rate 01/04/17 1247 63     Resp 01/04/17 1247 18     Temp 01/04/17 1247 98.4 F (36.9 C)     Temp Source 01/04/17 1247 Oral     SpO2 01/04/17 1247 94 %     Weight 01/04/17 1248 129 lb (58.5 kg)     Height 01/04/17 1248 5\' 6"  (1.676 m)     Head Circumference --      Peak Flow --      Pain Score 01/04/17 1247 10     Pain Loc --      Pain Edu? --      Excl. in [place]? --     Vital signs reviewed, nursing assessments reviewed.   Constitutional:   Alert and oriented. Well appearing and in no distress. Eyes:   No  scleral icterus.  EOMI. No nystagmus. No conjunctival pallor. PERRL. ENT   Head:   Normocephalic and atraumatic.   Nose:   No congestion/rhinnorhea.    Mouth/Throat:   MMM, no pharyngeal erythema. No peritonsillar mass.    Neck:   No meningismus. Full ROM Hematological/Lymphatic/Immunilogical:   No cervical lymphadenopathy. Cardiovascular:   RRR. Symmetric bilateral radial and DP pulses.  No murmurs.  Respiratory:   Normal respiratory effort without tachypnea/retractions. Breath sounds are clear and equal bilaterally. No wheezes/rales/rhonchi. Gastrointestinal:   Soft and nontender. Non distended. There is no CVA tenderness.  No rebound, rigidity, or guarding. Genitourinary:   deferred Musculoskeletal:   Normal range of motion in all extremities. No joint effusions.  No lower extremity tenderness.  No edema. No midline spinal tenderness. No muscular tenderness in the area of indicated pain. No swelling or bruising or other soft tissue mass. Neurologic:   Normal speech and language.  Motor grossly intact. No gross focal neurologic deficits are appreciated.  Skin:    Skin is warm, dry and intact. No rash noted.  No petechiae, purpura, or bullae.  ____________________________________________    LABS (pertinent positives/negatives) (all labs ordered are listed, but only abnormal results are displayed) Labs Reviewed - No data to  display ____________________________________________   EKG    ____________________________________________    RADIOLOGY  No results found. X-rays from 01/01/2017 reviewed ____________________________________________   PROCEDURES Procedures  ____________________________________________   INITIAL IMPRESSION / ASSESSMENT AND PLAN / ED COURSE  Pertinent labs & imaging results that were available during my care of the patient were reviewed by me and considered in my medical decision making (see chart for details).  Patient presents with left lower back pain, musculoskeletal in nature. She does have a history of 3 cm AAA which was ultrasounded in the ED on her initial assessment and found to be stable. No evidence of any intra-abdominal pathology. Exam is not consistent with shingles. I highly doubt any kind of neurologic injury or radiculopathy. Discharge home to follow up with primary care regarding her ongoing symptoms. We'll try heat therapy and lidocaine patch.      ____________________________________________   FINAL CLINICAL IMPRESSION(S) / ED DIAGNOSES  Final diagnoses:  Acute left-sided low back pain without sciatica  Strain of lumbar region, subsequent encounter      New Prescriptions   LIDOCAINE (LIDODERM) 5 %    Place 1 patch onto the skin every 12 (twelve) hours. Remove & Discard patch within 12 hours or as directed by MD     Portions of this note were generated with dragon dictation software. Dictation errors may occur despite best attempts at proofreading.    Carrie Mew, MD 01/04/17 1312

## 2017-01-04 NOTE — ED Triage Notes (Signed)
Pt arrives via ems, pt states that she was seen post fall with back pain on thurs. Pt states that the pain is getting worse despite taking the meds. Pt states that she can not relieve the pain with position change and feels like a knot has developed on her left side at her underwear line. No obvious deformity noted, pt is axox3

## 2017-01-04 NOTE — Discharge Instructions (Signed)
You were seen in the ER today for back pain related to her fall 2 days ago. Your x-rays do not show any significant injuries, and your examination today does not reveal any new concerns. Follow-up with your primary care doctor for continued monitoring of your symptoms. Try a Lidoderm patch on the painful area for symptom relief. When you do not have a patch on, you can try a heating pad as well. Do not use a heating pad over the patch.

## 2017-01-08 ENCOUNTER — Encounter: Payer: Self-pay | Admitting: Family Medicine

## 2017-01-08 ENCOUNTER — Telehealth: Payer: Self-pay | Admitting: *Deleted

## 2017-01-08 ENCOUNTER — Ambulatory Visit (INDEPENDENT_AMBULATORY_CARE_PROVIDER_SITE_OTHER): Payer: Medicare Other | Admitting: Family Medicine

## 2017-01-08 VITALS — BP 141/54 | HR 70 | Temp 98.1°F | Resp 16 | Ht 66.0 in | Wt 133.4 lb

## 2017-01-08 DIAGNOSIS — M4696 Unspecified inflammatory spondylopathy, lumbar region: Secondary | ICD-10-CM | POA: Diagnosis not present

## 2017-01-08 DIAGNOSIS — M47816 Spondylosis without myelopathy or radiculopathy, lumbar region: Secondary | ICD-10-CM

## 2017-01-08 DIAGNOSIS — M5442 Lumbago with sciatica, left side: Secondary | ICD-10-CM | POA: Diagnosis not present

## 2017-01-08 DIAGNOSIS — M8000XA Age-related osteoporosis with current pathological fracture, unspecified site, initial encounter for fracture: Secondary | ICD-10-CM | POA: Diagnosis not present

## 2017-01-08 DIAGNOSIS — S22080A Wedge compression fracture of T11-T12 vertebra, initial encounter for closed fracture: Secondary | ICD-10-CM

## 2017-01-08 DIAGNOSIS — R112 Nausea with vomiting, unspecified: Secondary | ICD-10-CM

## 2017-01-08 DIAGNOSIS — M810 Age-related osteoporosis without current pathological fracture: Secondary | ICD-10-CM | POA: Insufficient documentation

## 2017-01-08 DIAGNOSIS — G8929 Other chronic pain: Secondary | ICD-10-CM

## 2017-01-08 MED ORDER — ONDANSETRON 4 MG PO TBDP: 4.0000 mg | ORAL_TABLET | Freq: Three times a day (TID) | ORAL | 0 refills | Status: DC | PRN

## 2017-01-08 MED ORDER — CALCITONIN (SALMON) 200 UNIT/ACT NA SOLN: 1.0000 | Freq: Every day | NASAL | 1 refills | Status: DC

## 2017-01-08 MED ORDER — CYCLOBENZAPRINE HCL 10 MG PO TABS: 10.0000 mg | ORAL_TABLET | Freq: Three times a day (TID) | ORAL | 1 refills | Status: DC | PRN

## 2017-01-08 MED ORDER — PREDNISONE 20 MG PO TABS: ORAL_TABLET | ORAL | 0 refills | Status: DC

## 2017-01-08 NOTE — Telephone Encounter (Signed)
[name] saw patient today for follow-up from ED visit due to fall. Would like patient to see Dr. Dossie Arbour for pain management follow-up and to possibly order MRI. Patient will be calling to schedule appointment.

## 2017-01-08 NOTE — Patient Instructions (Addendum)
Thank you for coming to the clinic today.  1. For your Back Pain - I think that this is due to Muscle Spasms or strain. Your Sciatic Nerve can be affected causing some of your radiation and numbness down your legs. 2. Start Prednisone taper 3. Start Cyclobenzapine (Flexeril) 10mg  tablets - cut in half for 5mg  at night for muscle relaxant - may make you sedated or sleepy (be careful driving or working on this) if tolerated you can take every 8 hours, half or whole tab 4. May use Tylenol Extra Str 500mg  tabs - may take 1-2 tablets every 6 hours as needed 5. Recommend to start using heating pad on your lower back 1-2x daily for few weeks  Will discuss with [facility] to determine if need MRI  Go ahead and start Cipro for antibiotic  Take nausea medicine Zofran oral dissolving tablet as needed  Continue other pain medicines as prescribed by Pain Management  This pain may take weeks to months to fully resolve, but hopefully it will respond to the medicine initially. All back injuries (small or serious) are slow to heal since we use our back muscles every day. Be careful with turning, twisting, lifting, sitting / standing for prolonged periods, and avoid re-injury.  If your symptoms significantly worsen with more pain, or new symptoms with weakness in one or both legs, new or different shooting leg pains, numbness in legs or groin, loss of control or retention of urine or bowel movements, please call back for advice and you may need to go directly to the Emergency Department.  Please schedule a Follow-up Appointment to: Return in about 4 weeks (around 02/05/2017) for Back Pain.  If you have any other questions or concerns, please feel free to call the clinic or send a message through [place]. You may also schedule an earlier appointment if necessary.  Nobie Putnam, DO [place]

## 2017-01-08 NOTE — Progress Notes (Signed)
Subjective:    Patient ID: Brittney Simpson, female    DOB: Apr 08, 1928, 83 y.o.   MRN: [id number]  Brittney Simpson is a 83 y.o. female presenting on 01/08/2017 for geriatic fall (pt had fall week ago hurt back Xray was done she also has abdominal  pain )   HPI   Patient accompanied by daughter (and primary caregiver) [name], who provides majority of history and able to help facilitate history from her mother, limited by hearing.  HPI   ED FOLLOW-UP VISIT  Hospital/Location: [facility] ED Date of ED Visits: 01/01/17, 01/04/17 - Patient was not hospitalized or admitted overnight.  Reason for ED visit: Fall, acute back pain  ED FOLLOW-UP - FALL / ACUTE ON CHRONIC BACK PAIN / Chronic Pain with Lumbar DJD / Spinal Stenosis / [facility] ED Provider Notes and results have been reviewed - Patient presents today about 4 days after last ED visit. Brief summary of recent course, patient had symptoms of fall 1 week ago, they were concerned for injury went to [facility] ED on 01/01/17, had Lumbar Spine, possible compression fracture T12 vs  progression has multiple level severe DJD, and Pelvis X-ray negative for fracture treated with Hydrocodone and Flexeril PRN, mild improvement only on Flexeril then discharged ED. No improvement, returned on 01/04/17 [facility] ED, re-evaluation, no further imaging performed, gave her refill muscle relaxant and lidocaine pain patch. Patient was asking for an MRI but they did not do any other testing. Discharged to home to follow-up.  Today reports overall has had persistent to worsening pain since discharged from ED. Symptoms of bilateral low back pain radiating to left side and flank/abdomen LLQ not improving and seem to be worsening again. Describes pain as severe mostly persistent difficulty worse with position changes, otherwise if resting somewhat improves. Admits sciatic pain and some paresthesias radiating into Left lower extremity, not constant. - New  medications on discharge: Flexeril 8m (some improvement, requesting refill or higher dose), Lidocaine Patch. Initial visit to ED given rx Hydrocodone/Acetaminophen. - Changes to current meds on discharge: None - Taking Tramadol PRN from Pain Management - Other conservative therapy not improved - [facility] NDossie Arbour has received injections in past, complex history with significant lumbar DJD - Prior similar back pain flares in past - Admits nausea and vomiting due to pain, poor appetite - Additionally with some constipation recent onset, endorses LLQ abdominal discomfort, seems radiating from back and can feel area of "hard spot or fullness" but not present today, she tried Miralax 1-2 doses so far with bowel movement so improved but still has symptom intermittently and still bloating - Denies any fevers/chills, numbness, tingling, weakness, loss of control bladder/bowel incontinence or retention, unintentional wt loss, night sweats  H/o Recurrent UTI - Last visit 10/25/16 discussed this problem, she was given Cipro PRN for UTI, and she is being treated by Nephrology for chronic antibiotic prophylaxis with Bactrim. - Additional complaint, she started to have some burning with possible UTI. She is asking if can start Cipro. No urine sample today in office. Last UTI 3 months ago. Her typical UTI symptoms are low back pain symptoms, dysuria, frequency, also will usually get some confusion and cognitive symptoms.  Additionally history of Osteoporosis (last DEXA 2012, T score -3.5)  Social History  Substance Use Topics  . Smoking status: Never Smoker  . Smokeless tobacco: Never Used  . Alcohol use No    Review of Systems Per HPI unless specifically  indicated above     Objective:    BP (!) 141/54   Pulse 70   Temp 98.1 F (36.7 C) (Oral)   Resp 16   Ht _0  (1.676 m)   Wt 133 lb 6.4 oz (60.5 kg)   SpO2 97%   BMI 21.53 kg/m   Wt Readings from Last 3  Encounters:  01/08/17 133 lb 6.4 oz (60.5 kg)  01/04/17 129 lb (58.5 kg)  01/01/17 129 lb 14.4 oz (58.9 kg)    Physical Exam  Constitutional: She is oriented to person, place, and time. She appears well-developed and well-nourished. No distress.  Currently ill-appearing and uncomfortable due to low back pain R >L with R flank and LLQ abdominal pain, in wheelchair, mostly cooperative  HENT:  Head: Normocephalic and atraumatic.  Mild dry mucus mem.  Eyes: Conjunctivae are normal. Right eye exhibits no discharge. Left eye exhibits no discharge.  Neck: Neck supple.  Cardiovascular: Normal rate, regular rhythm, normal heart sounds and intact distal pulses.   No murmur heard. Pulmonary/Chest: Effort normal and breath sounds normal. No respiratory distress. She has no wheezes. She has no rales.  Abdominal: Soft. Bowel sounds are normal. She exhibits no distension and no mass. There is tenderness (Mild discomfort LLQ not entirely reproducible on exam). There is no rebound and no guarding.  Musculoskeletal: She exhibits no edema.  Low Back Inspection: Normal appearance, thin body habitus, some chronic spinal deformity with abnormal lumbar and thoracic spinal curvature Palpation: No distinct tenderness over spinous processes including T12 and lower lumbar, but has more paraspinal muscle spasm and pain L > R ROM: Limited active ROM forward flex / back extension Special Testing: Seated SLR positive for radicular pain Left Strength: Bilateral hip flex/ext 5/5, knee flex/ext 5/5, ankle dorsiflex/plantarflex 5/5 Neurovascular: intact distal sensation to light touch  Neurological: She is alert and oriented to person, place, and time.  Skin: Skin is warm and dry. No rash noted. She is not diaphoretic. No erythema.  Psychiatric: She has a normal mood and affect. Her behavior is normal.  Nursing note and vitals reviewed.    I have personally reviewed the radiology report from 01/01/17, Lumbar Spine and  Pelvis X-ray  CLINICAL DATA:  Pt arrived via EMS from home for reports of fall in bathroom. Pt states she was getting up from the toilet and not sure what happened but she fell back and hit her back on the toilet; Pt reports low back pain radiating into left leg.  EXAM: LUMBAR SPINE - COMPLETE 4+ VIEW  COMPARISON:  CT abdomen pelvis- 06/14/2016; pelvic radiographs-earlier same day  FINDINGS: There are 5 non rib-bearing lumbar type vertebral bodies.  There is a mild scoliotic curvature of the thoracolumbar spine with dominant caudal component convex to the right measuring approximately 10 degrees (as measured from the superior endplate of R91 to the inferior endplate of L4. Re- demonstrated grade 2 anterolisthesis measuring approximately 1.4 cm with associated bilateral L5 pars defects, similar to abdominal CT performed 06/14/2016. There is un chain mild straightening expected lumbar lordosis.  Mild (approximately 25%) compression deformity involving the superior endplate of the M38 vertebral body. Remaining lumbar vertebral body heights appear preserved.  Moderate severe multilevel lumbar spine DDD, worse at L2-L3 with disc space height loss, endplate irregularity and sclerosis.  Irregular atherosclerotic plaque compatible with known abdominal aortic aneurysm. Large colonic stool burden. Several phleboliths overlie the lower pelvis.  IMPRESSION: 1. Mild (approximately 25%) compression deformity involving the superior endplate of  T12, new compared to abdominal CT performed 06/14/2016. Correlation for point tenderness at this location is recommended. 2. Moderate severe multilevel lumbar spine DDD, worse at L2-L3. 3. Aortic aneurysm NOS (ICD10-I71.9), as demonstrated on abdominal CT performed 05/2016. 4.  Aortic Atherosclerosis (ICD10-I70.0).   Electronically Signed   By: Sandi Mariscal M.D.   On: 01/01/2017  08:55 --------------------------------------------------------------  CLINICAL DATA:  Pt arrived via EMS from home for reports of fall in bathroom. Pt states she was getting up from the toilet and not sure what happened but she fell back and hit her back on the toilet; Pt reports low back pain radiating into left leg.  EXAM: PELVIS - 1-2 VIEW  COMPARISON:  Lumbar spine radiographs-earlier same day  FINDINGS: No definite displaced pelvic fracture. Suspected mild degenerative change of the bilateral hips with joint space loss, subchondral sclerosis and osteophytosis, incompletely evaluated. A minimal amount of chondrocalcinosis is noted within the pubic symphysis. Limited visualization the bilateral SI joints is normal.  Several phleboliths overlie the lower pelvis bilaterally. Large colonic stool burden.  IMPRESSION: No displaced pelvic fracture.   Electronically Signed   By: Sandi Mariscal M.D.   On: 01/01/2017 08:57  Results for orders placed or performed in visit on 11/18/16  COMPLETE METABOLIC PANEL WITH GFR  Result Value Ref Range   Sodium 136 135 - 146 mmol/L   Potassium 4.2 3.5 - 5.3 mmol/L   Chloride 101 98 - 110 mmol/L   CO2 29 20 - 31 mmol/L   Glucose, Bld 92 65 - 99 mg/dL   BUN 14 7 - 25 mg/dL   Creat 0.86 0.60 - 0.88 mg/dL   Total Bilirubin 0.4 0.2 - 1.2 mg/dL   Alkaline Phosphatase 59 33 - 130 U/L   AST 11 10 - 35 U/L   ALT 9 6 - 29 U/L   Total Protein 6.2 6.1 - 8.1 g/dL   Albumin 3.6 3.6 - 5.1 g/dL   Calcium 8.9 8.6 - 10.4 mg/dL   GFR, Est African American 71 >=60 mL/min   GFR, Est Non African American 61 >=60 mL/min  Lipid panel  Result Value Ref Range   Cholesterol 232 (H) <200 mg/dL   Triglycerides 99 <150 mg/dL   HDL 42 (L) >50 mg/dL   Total CHOL/HDL Ratio 5.5 (H) <5.0 Ratio   VLDL 20 <30 mg/dL   LDL Cholesterol 170 (H) <100 mg/dL  Hemoglobin A1c  Result Value Ref Range   Hgb A1c MFr Bld 5.5 <5.7 %   Mean Plasma Glucose 111 mg/dL  TSH   Result Value Ref Range   TSH 0.63 mIU/L  CBC with Differential/Platelet  Result Value Ref Range   WBC 4.8 3.8 - 10.8 K/uL   RBC 4.91 3.80 - 5.10 MIL/uL   Hemoglobin 11.9 11.7 - 15.5 g/dL   HCT 38.8 35.0 - 45.0 %   MCV 79.0 (L) 80.0 - 100.0 fL   MCH 24.2 (L) 27.0 - 33.0 pg   MCHC 30.7 (L) 32.0 - 36.0 g/dL   RDW 15.3 (H) 11.0 - 15.0 %   Platelets 171 140 - 400 K/uL   MPV 9.8 7.5 - 12.5 fL   Neutro Abs 2,784 1,500 - 7,800 cells/uL   Lymphs Abs 1,392 850 - 3,900 cells/uL   Monocytes Absolute 624 200 - 950 cells/uL   Eosinophils Absolute 0 (L) 15 - 500 cells/uL   Basophils Absolute 0 0 - 200 cells/uL   Neutrophils Relative % 58 %   Lymphocytes Relative 29 %  Monocytes Relative 13 %   Eosinophils Relative 0 %   Basophils Relative 0 %   Smear Review Criteria for review not met       Assessment & Plan:   Problem List Items Addressed This Visit    Osteoporosis   Relevant Medications   calcitonin, salmon, (MIACALCIN/FORTICAL) 200 UNIT/ACT nasal spray   Lumbar spondylosis (Chronic)   Relevant Medications   cyclobenzaprine (FLEXERIL) 10 MG tablet   predniSONE (DELTASONE) 20 MG tablet   Lumbar facet syndrome (Bilateral) (R>L) (Chronic)   Relevant Medications   cyclobenzaprine (FLEXERIL) 10 MG tablet   predniSONE (DELTASONE) 20 MG tablet    Other Visit Diagnoses    Acute bilateral low back pain with left-sided sciatica    -  Primary   Relevant Medications   cyclobenzaprine (FLEXERIL) 10 MG tablet   predniSONE (DELTASONE) 20 MG tablet   calcitonin, salmon, (MIACALCIN/FORTICAL) 200 UNIT/ACT nasal spray   Chronic bilateral low back pain with left-sided sciatica       Relevant Medications   cyclobenzaprine (FLEXERIL) 10 MG tablet   predniSONE (DELTASONE) 20 MG tablet   Non-intractable vomiting with nausea, unspecified vomiting type       Relevant Medications   ondansetron (ZOFRAN ODT) 4 MG disintegrating tablet   Compression fracture of T12 vertebra (HCC)       Relevant  Medications   calcitonin, salmon, (MIACALCIN/FORTICAL) 200 UNIT/ACT nasal spray      Acute on chronic L > R LBP with associated L sciatica. Suspect likely secondary to fall injury at home, traumatic injury may have caused T12 worsening vs new compression fracture seen on X-ray in ED vs muscle spasm/strain LBP flare with known significant DJD / facet / stenosis symptoms - Concern with severity of symptoms, secondary nausea/vomiting, some LLQ abdominal pain and constipation, radicular symptoms on Left leg - Inadequate conservative therapy or rx pain management (hydrocodone from ED, tramadol from pain management, initial improve muscle relaxant) - No red flag neurological complications today  Plan: 1. Discussion today with patient and daughter caregiver, limited options here in office for acute pain - concern with strong NSAIDs, prefer to avoid this today 2. Increase dose Flexeril to 20m, new rx, since she tolerated last 532mdose well with some improvement 3. Start Prednisone taper over 12 days to help with any back pain / inflammation and radicular symptoms, limited other options for analgesia since she has tried most others. Advised her that I am not planning to rx any opiates for pain, she may contact her pain management specialist if need discuss meds. 4. Added Calcitonin nasal spray 200 unit per spray once daily for 4 weeks alternating nares - may help reduce acute pain if compression fracture 5. Maximize Tylenol dose, other conservative care, RICE therapy, moist heat 6. No new imaging obtained today, discussion on indication for MRI - this can be considered, however would likely not be acute. I advised them I would contact [facility] to check with Dr NaDossie Arbouro see if he would consider adding this in near future or facilitating with Lumbar MRI, however told them that likely MRI acutely would not change the treatment plan, but may provide more information if it is needed. They should  contact [facility] to follow-up sooner to discuss pain if needed 7. Rx Zofran ODT for nausea, improve hydration 8. Continue Miralax regularly for now - likely acute pain affecting constipation and opiate medicine, benign abdomen less likely other etiology at this time 9. Additionally -  concern for possible early UTI - may take Cipro if needed, no urine specimen collected today, may follow-up with her Nephrology to discuss her chronic UTI management in future if not improving 10. Follow-up 2-4 weeks as needed for back pain - again limited options in outpatient setting. If she is in severe worsening pain, other concerning symptoms, neurological complications, return criteria reviewed when to go to hospital ED again for re-evaluation  **UPDATE 01/08/17 5:45pm** Called patient spoke with daughter [name] to check on her, she has started the medicines, I advised her about the update on the Calcitonin to try as well, sent rx in. Follow-up as needed. She was concerned about mixing prednisone and cipro. I advised if has affected her in past, she may finish Cipro first if truly symptoms of UTI for her, then resume prednisone if still needed.   Meds ordered this encounter  Medications  . sulfamethoxazole-trimethoprim (BACTRIM) 400-80 MG tablet    Sig: Take 1 tablet by mouth once.  . cyclobenzaprine (FLEXERIL) 10 MG tablet    Sig: Take 1 tablet (10 mg total) by mouth 3 (three) times daily as needed for muscle spasms.    Dispense:  30 tablet    Refill:  1  . predniSONE (DELTASONE) 20 MG tablet    Sig: Take 2 tablets daily (67m) for 4 days, take 1 tab daily (269m for 4 days, take half tab daily (1054mfor 4 days    Dispense:  14 tablet    Refill:  0  . ondansetron (ZOFRAN ODT) 4 MG disintegrating tablet    Sig: Take 1 tablet (4 mg total) by mouth every 8 (eight) hours as needed for nausea or vomiting.    Dispense:  30 tablet    Refill:  0  . calcitonin, salmon, (MIACALCIN/FORTICAL) 200 UNIT/ACT  nasal spray    Sig: Place 1 spray into alternate nostrils daily. For 4 weeks, for pain with compression fracture    Dispense:  3.7 mL    Refill:  1     Follow up plan: Return in about 4 weeks (around 02/05/2017) for Back Pain.  AleNobie PutnamO [employer] 01/08/2017, 5:54 PM

## 2017-01-09 NOTE — Telephone Encounter (Signed)
Ms. Humbarger's doctor called yesterday. Shewas to call to make an appointment with Dr. Dossie Arbour.

## 2017-01-10 ENCOUNTER — Emergency Department (HOSPITAL_COMMUNITY): Payer: Medicare Other

## 2017-01-10 ENCOUNTER — Emergency Department (HOSPITAL_COMMUNITY)
Admission: EM | Admit: 2017-01-10 | Discharge: 2017-01-11 | Disposition: A | Discharge status: Other Acute Inpatient Hospital | Payer: Medicare Other | Attending: Emergency Medicine | Admitting: Emergency Medicine

## 2017-01-10 ENCOUNTER — Telehealth: Payer: Self-pay

## 2017-01-10 ENCOUNTER — Telehealth: Payer: Self-pay | Admitting: Pain Medicine

## 2017-01-10 ENCOUNTER — Encounter (HOSPITAL_COMMUNITY): Payer: Self-pay

## 2017-01-10 DIAGNOSIS — Y929 Unspecified place or not applicable: Secondary | ICD-10-CM | POA: Insufficient documentation

## 2017-01-10 DIAGNOSIS — S3992XA Unspecified injury of lower back, initial encounter: Secondary | ICD-10-CM | POA: Diagnosis not present

## 2017-01-10 DIAGNOSIS — Y999 Unspecified external cause status: Secondary | ICD-10-CM | POA: Diagnosis not present

## 2017-01-10 DIAGNOSIS — N183 Chronic kidney disease, stage 3 (moderate): Secondary | ICD-10-CM | POA: Insufficient documentation

## 2017-01-10 DIAGNOSIS — E039 Hypothyroidism, unspecified: Secondary | ICD-10-CM | POA: Diagnosis not present

## 2017-01-10 DIAGNOSIS — I129 Hypertensive chronic kidney disease with stage 1 through stage 4 chronic kidney disease, or unspecified chronic kidney disease: Secondary | ICD-10-CM | POA: Insufficient documentation

## 2017-01-10 DIAGNOSIS — Y939 Activity, unspecified: Secondary | ICD-10-CM | POA: Diagnosis not present

## 2017-01-10 DIAGNOSIS — M4854XA Collapsed vertebra, not elsewhere classified, thoracic region, initial encounter for fracture: Secondary | ICD-10-CM

## 2017-01-10 DIAGNOSIS — W19XXXA Unspecified fall, initial encounter: Secondary | ICD-10-CM | POA: Diagnosis not present

## 2017-01-10 DIAGNOSIS — S3982XA Other specified injuries of lower back, initial encounter: Secondary | ICD-10-CM | POA: Diagnosis not present

## 2017-01-10 DIAGNOSIS — S22080A Wedge compression fracture of T11-T12 vertebra, initial encounter for closed fracture: Secondary | ICD-10-CM | POA: Diagnosis not present

## 2017-01-10 DIAGNOSIS — M8008XA Age-related osteoporosis with current pathological fracture, vertebra(e), initial encounter for fracture: Secondary | ICD-10-CM | POA: Insufficient documentation

## 2017-01-10 MED ORDER — HYDROCODONE-ACETAMINOPHEN 5-325 MG PO TABS
1.0000 | ORAL_TABLET | Freq: Once | ORAL | Status: AC
  Administered 2017-01-10: 1 via ORAL
  Filled 2017-01-10: qty 1

## 2017-01-10 MED ORDER — ONDANSETRON 4 MG PO TBDP
4.0000 mg | ORAL_TABLET | Freq: Once | ORAL | Status: AC
  Administered 2017-01-10: 4 mg via ORAL
  Filled 2017-01-10: qty 1

## 2017-01-10 NOTE — Telephone Encounter (Signed)
Phone call to [facility] to let her know of the conversation with Dr Marthann Schiller office and who I had spoken with.

## 2017-01-10 NOTE — Telephone Encounter (Signed)
After speaking with daughter, [name] 2 different times on the phone and reviewing her mothers s/s it was expressed that she should have her mother seen by either DR K or at the ED, especially if s/s are worsening.  I did reach out to Dr Marthann Schiller office to let them know of our discussion and our expectations of treatment in an acute situation as we manage chronic pain.  Caryl Pina at Dr Marthann Schiller office took phone call and said that she would speak with Dr Raliegh Ip between patients re; Brittney Simpson's situation.  Brittney Bayard does have an appt on Tuesday June 19 with Dr Dossie Arbour in case this has not resolved.

## 2017-01-10 NOTE — Telephone Encounter (Signed)
Reviewed last office visit with me 01/08/17, and two prior [facility] ED visit 01/01/17, 01/04/17. Also recent telephone conversations between patient's daughter and [facility].  Called patient / daughter back today 01/10/17, spoke with [name] approx 11:20am.  These are the following updates:  1. Her back pain and left leg radicular pain is not improved, seems to be worsening after her initial fall >1 week ago. Has had other fall at home as well. Not improved on current medications, and even higher dose Flexeril rx by me on 01/08/17. She may benefit from Ortho in future with compression fracture, and has been started on Calcitonin nasal but this likely will not control pain acutely.  2. She also endorsed symptoms of UTI, and started her existing rx Cipro BID for 5 days as advised at office visit, now with significant improvement, her last dose is Sunday 6/17.  3. Since she is followed by [facility] on Tramadol, and given Hydrocodone in ED earlier without significant improvement. I made decision to not prescribe even stronger other opiates for pain acutely 01/08/17. Instead, with some radicular symptoms I offered Prednisone taper as anti-inflammatory, however she only took 1 dose, then we agreed to hold it due to taking Cipro, and she had reaction before when took a similar combination in past. She still has prednisone rx and will consider starting it Sunday when finish Cipro.  4. Patient's daughter, [name], was requesting MRI of her back since she already had X-ray and was worsening. We discussed finding of X-ray and suggestive of possible new or progression of Compression Fracture at T12. I advised that MRI may not change her treatment plan, but I agree some form of advanced imaging may help diagnostically for her spine, now with worsening over almost 2 weeks and some radicular symptoms. I deferred this initially to Pain Management since they had been managing her spine for longer to consider  advanced imaging in the future. She is scheduled with [facility] on 01/14/17, after I contacted them, however I understand they would primarily be treating her chronic pain, but my intention was to consider her tramadol or other potential pain relief medicine changes only if needed (with hope that by next week her acute pain would have been improved).  5. Lastly - after discussion with [name] today, I again advised her that if continued worsening, there is limited else I can offer in outpatient setting, also Pain Management is not ideal for managing and diagnosing her acute pain now. Therefore, as advised on 01/08/17, again I stated that she most likely needs to return to [facility] ED for more expedited advanced back imaging, other diagnostics, and more acute pain control options.  6. I have called [facility] ED and spoke with Nurse Triage to notify them of patient's pending arrival by personal vehicle this afternoon approx 2pm, and reviewed the above course.  Nobie Putnam, [facility] 01/10/2017, 11:56 AM

## 2017-01-10 NOTE — ED Notes (Signed)
Patient transported to MRI 

## 2017-01-10 NOTE — Telephone Encounter (Signed)
Lori from pain clinic reporting she spoke with patients daughter.  She is reporting that her mother is not getting any better and has fallen a couple more times.    They have appointment with pain clinic on Tuesday.  However, Cecille Rubin said that this is an acute issue and they normally do not take care of acute problems and they would like PCP to take care if problem.    Cecille Rubin did advise daughter to bring Mrs. Lucente to see PCP or go to [place].    You can contact [place] at [phone]

## 2017-01-10 NOTE — ED Provider Notes (Signed)
[facility] DEPT Provider Note   CSN: [id number] Arrival date & time: 01/10/17  1423     History   Chief Complaint Chief Complaint  Patient presents with  . Fall    HPI Brittney Simpson is a 83 y.o. female who presents to the Complaining of back pain. She was seen on 68 for a mechanical fall. She had a new T12 compression fracture. Since that time she's had worsening back pain including radiculopathy down the left leg and complains that she is also experiencing weakness of flexion of the hip and has had difficulty walking. She states all of this is new, although she does have a history of chronic back pain. She was seen again [facility] just recently and has been in touch with her PCP. With her pain management nurse on call, and PCP referred her to this emergency Department today. She denies loss of bowel or bladder control, saddle anesthesia. Denies a history of cancer. HPI  Past Medical History:  Diagnosis Date  . Anxiety   . Back ache   . Hiatal hernia   . Hyperlipidemia   . Hypertension   . Hypothyroid   . Osteopenia   . Thyroid disease   . UTI (urinary tract infection)     Patient Active Problem List   Diagnosis Date Noted  . Osteoporosis 01/08/2017  . Bilateral hearing loss 11/19/2016  . Hyperlipidemia 11/18/2016  . Abnormal glucose 11/18/2016  . Recurrent UTI 10/25/2016  . GERD (gastroesophageal reflux disease) 10/25/2016  . Presbycusis of both ears 10/25/2016  . Lumbar spondylosis 05/14/2016  . Hypothyroidism 04/23/2016  . Essential hypertension 10/11/2015  . CKD (chronic kidney disease) stage 3, GFR 30-59 ml/min 10/11/2015  . Skin lesion of scalp 10/11/2015  . Chronic anxiety 10/11/2015  . Insomnia 10/11/2015  . Spondylolisthesis of lumbosacral region (L2-3 and L5-S1) 08/22/2015  . Lumbar spinal stenosis (9 mm at L3-4) 08/22/2015  . Trochanteric bursitis (Right) 08/22/2015  . Chronic hip pain (Right) 08/22/2015  . Osteoarthritis of hip (Right)  08/22/2015  . Chronic sacroiliac joint pain (Right) 08/22/2015  . Chronic lumbar radicular pain (Right) 08/22/2015  . Chronic lower extremity pain (Right) 08/22/2015  . Myofascial pain 08/22/2015  . Encounter for chronic pain management 08/22/2015  . Chronic low back pain (Bilateral) (R>L) 06/03/2015  . Chronic pain 05/24/2015  . Long term current use of opiate analgesic 05/24/2015  . Long term prescription opiate use 05/24/2015  . Opiate use (15 MME/Day) 05/24/2015  . Encounter for therapeutic drug level monitoring 05/24/2015  . Lumbar facet syndrome (Bilateral) (R>L) 05/24/2015    Past Surgical History:  Procedure Laterality Date  . HEMORRHOID SURGERY    . HERNIA REPAIR    . HERNIA REPAIR      OB History    No data available       Home Medications    Prior to Admission medications   Medication Sig Start Date End Date Taking? Authorizing Provider  amLODipine (NORVASC) 5 MG tablet Take 1 tablet (5 mg total) by mouth daily. 04/23/16  Yes Arlis Porta., MD  brimonidine [facility]) 0.2 % ophthalmic solution Place 1 drop into the left eye 2 (two) times daily.  08/24/15  Yes [provider]  calcitonin, salmon, (MIACALCIN/FORTICAL) 200 UNIT/ACT nasal spray Place 1 spray into alternate nostrils daily. For 4 weeks, for pain with compression fracture 01/08/17  Yes Karamalegos, Alexander J, DO  clonazePAM (KLONOPIN) 0.5 MG tablet TAKE 1/2 TABLET BY MOUTH TWICE DAILY FOR ANXIETY. 10/25/16  Yes Karamalegos, Devonne Doughty, DO  cyclobenzaprine (FLEXERIL) 10 MG tablet Take 1 tablet (10 mg total) by mouth 3 (three) times daily as needed for muscle spasms. 01/08/17  Yes Karamalegos, Devonne Doughty, DO  Ergocalciferol (VITAMIN D2) 2000 UNITS TABS Take 1 capsule by mouth daily.    Yes [provider]  furosemide (LASIX) 20 MG tablet Take 1 tablet (20 mg total) by mouth daily. 04/23/16  Yes Arlis Porta., MD  latanoprost (XALATAN) 0.005 % ophthalmic solution Place 1 drop into  the left eye at bedtime.  09/22/15  Yes [provider]  levothyroxine (SYNTHROID, LEVOTHROID) 125 MCG tablet TAKE 1 TABLET BY MOUTH ONCE DAILY ON AN EMPTY STOMACH. WAIT 30 MINUTES BEFORE TAKING OTHER MEDS. 04/23/16  Yes Arlis Porta., MD  lisinopril (PRINIVIL,ZESTRIL) 40 MG tablet Take 1 tablet (40 mg total) by mouth daily. 04/23/16  Yes Arlis Porta., MD  metoprolol succinate (TOPROL-XL) 100 MG 24 hr tablet Take 1 tablet (100 mg total) by mouth daily. Take with or immediately following a meal. 11/29/16  Yes Karamalegos, Devonne Doughty, DO  omeprazole (PRILOSEC) 20 MG capsule Take 1 capsule (20 mg total) by mouth daily. 10/25/16  Yes Karamalegos, Devonne Doughty, DO  ondansetron (ZOFRAN ODT) 4 MG disintegrating tablet Take 1 tablet (4 mg total) by mouth every 8 (eight) hours as needed for nausea or vomiting. 01/08/17  Yes Karamalegos, Devonne Doughty, DO  sulfamethoxazole-trimethoprim (BACTRIM) 400-80 MG tablet Take 1 tablet by mouth daily.    Yes [provider]  timolol (TIMOPTIC) 0.5 % ophthalmic solution Place 1 drop into the left eye 2 (two) times daily.  09/05/16  Yes [provider]  traMADol (ULTRAM) 50 MG tablet Take 1 tablet (50 mg total) by mouth 3 (three) times daily as needed for severe pain. 01/01/17 06/30/17 Yes Drenda Freeze, MD  traZODone (DESYREL) 50 MG tablet Take 1 tablet (50 mg total) by mouth at bedtime. 10/25/16  Yes Karamalegos, Devonne Doughty, DO  HYDROcodone-acetaminophen (NORCO) 5-325 MG tablet Take 1 tablet by mouth every 4 (four) hours as needed for moderate pain. Patient not taking: Reported on 01/11/2017 01/01/17   Drenda Freeze, MD  lidocaine (LIDODERM) 5 % Place 1 patch onto the skin every 12 (twelve) hours. Remove & Discard patch within 12 hours or as directed by MD Patient not taking: Reported on 01/11/2017 01/04/17   Carrie Mew, MD  predniSONE (DELTASONE) 20 MG tablet Take 2 tablets daily (40mg ) for 4 days, take 1 tab daily (20mg ) for 4  days, take half tab daily (10mg ) for 4 days Patient not taking: Reported on 01/11/2017 01/08/17   Olin Hauser, DO    Family History Family History  Problem Relation Age of Onset  . Stroke Mother   . Cancer Father     Social History Social History  Substance Use Topics  . Smoking status: Never Smoker  . Smokeless tobacco: Never Used  . Alcohol use No     Allergies   Patient has no known allergies.   Review of Systems Review of Systems  Ten systems reviewed and are negative for acute change, except as noted in the HPI.   Physical Exam Updated Vital Signs BP (!) 174/69   Pulse 67   Temp 98.1 F (36.7 C)   Resp 18   Ht 5\' 6"  (1.676 m)   Wt 58.1 kg (128 lb)   SpO2 95%   BMI 20.66 kg/m   Physical Exam  Constitutional: She  is oriented to person, place, and time. She appears well-developed and well-nourished. No distress.  HENT:  Head: Normocephalic and atraumatic.  Eyes: Conjunctivae are normal. No scleral icterus.  Neck: Normal range of motion.  Cardiovascular: Normal rate, regular rhythm, normal heart sounds and intact distal pulses.  Exam reveals no gallop and no friction rub.   No murmur heard. Pulmonary/Chest: Effort normal and breath sounds normal. No respiratory distress.  Abdominal: Soft. Bowel sounds are normal. She exhibits no distension and no mass. There is no tenderness. There is no guarding.  Neurological: She is alert and oriented to person, place, and time. She displays normal reflexes.  Weakness with flexion of the left hip  Skin: Skin is warm and dry. She is not diaphoretic.  Psychiatric: Her behavior is normal.  Nursing note and vitals reviewed.    ED Treatments / Results  Labs (all labs ordered are listed, but only abnormal results are displayed) Labs Reviewed - No data to display  EKG  EKG Interpretation None       Radiology No results found.  Procedures Procedures (including critical care time)  Medications  Ordered in ED Medications  HYDROcodone-acetaminophen (NORCO/VICODIN) 5-325 MG per tablet 1 tablet (1 tablet Oral Given 01/10/17 2106)  ondansetron (ZOFRAN-ODT) disintegrating tablet 4 mg (4 mg Oral Given 01/10/17 2106)  HYDROmorphone (DILAUDID) injection 0.5 mg (0.5 mg Intravenous Given 01/11/17 0137)  ondansetron (ZOFRAN) injection 4 mg (4 mg Intravenous Given 01/11/17 0137)     Initial Impression / Assessment and Plan / ED Course  I have reviewed the triage vital signs and the nursing notes.  Pertinent labs & imaging results that were available during my care of the patient were reviewed by me and considered in my medical decision making (see chart for details).  Clinical Course as of Jan 15 1718  Sat Jan 11, 2017  0031 MRI shows worsening of an acute T12 compression fracture with now 75-80% height loss and a 4 mm retropulsion. I spoke to Dr. Cyndy Freeze who states that this represents an unstable fracture and that she needs intervention at [facility]. This MRI also shows increased of about 1 cm in because of her abdominal aortic aneurysm which will need close follow-up.  [AH]    Clinical Course User Index [AH] Margarita Mail, PA-C    The patient accepted in transfer. I have discussed the need for follow-up on her abdominal aortic aneurysm with a vascular specialist. Patient is stable for transfer.  Final Clinical Impressions(s) / ED Diagnoses   Final diagnoses:  Non-traumatic compression fracture of twelfth thoracic vertebra, initial encounter [facility])    New Prescriptions Discharge Medication List as of 01/11/2017  2:07 AM       Margarita Mail, PA-C 01/15/17 1719    Mesner, Corene Cornea, MD 01/15/17 1940

## 2017-01-10 NOTE — ED Notes (Signed)
Pt had a mechanical fall about a week ago and was seen at [facility] and given muscle relaxers and pain medications. Pt reports she is still in pain as the meds are not helping. Pt also reports she fell again this morning when trying to get out of her bed, no head injury, no LOC. She reports constant lower back pain and was encouraged to come to [facility] ED "for a second set of eyes." pt is A&Ox4.

## 2017-01-10 NOTE — ED Notes (Addendum)
PCP, [name], advised pt to ED  for further evaluation and management of chronic back pain that has been increased recently, pain management has been unable to control her pain. PCP is concerned that patient may need additional testing/imaging studies urgently for her worsening/acute symptoms.

## 2017-01-10 NOTE — Telephone Encounter (Signed)
Arbie Cookey Hoggard calling about Dr. Dossie Arbour patient Brittney Simpson. Patient had a fall on Wed June 6 and went to the ED but was told they did not see any fractures. Pain continued so patient went to PCP and he thought maybe there was fracture showing in xray. Would like Dr. Dossie Arbour to look at recent ED Xray and see if fracture is new or older one. He also wanted to see if Dr. Dossie Arbour would order an MRI. Patient has appt for this pain on Tue. June 19.

## 2017-01-10 NOTE — ED Provider Notes (Signed)
Medical screening examination/treatment/procedure(s) were conducted as a shared visit with non-physician practitioner(s) and myself.  I personally evaluated the patient during the encounter.  Had a fall recently with worsening of her chronic back pain. Had xr done about a week ago with likely acute fracture noted. Still with worsening symptoms and some left leg weakness with a radiculopathic pain.  On exam she has normal ability to lift legs of bed but had decreased hip flexor strength on left against resistance. Sensory is normal. Has some back tenderness but no stepoffs, deformities or other obvious trauma.  Plan for MRI to eval extent of fracture and ensure no nerve impingement which is highly suspected.     Merrily Pew, MD 01/15/17 [phone]

## 2017-01-11 DIAGNOSIS — S22038A Other fracture of third thoracic vertebra, initial encounter for closed fracture: Secondary | ICD-10-CM | POA: Diagnosis not present

## 2017-01-11 DIAGNOSIS — S3992XA Unspecified injury of lower back, initial encounter: Secondary | ICD-10-CM | POA: Diagnosis present

## 2017-01-11 DIAGNOSIS — N183 Chronic kidney disease, stage 3 (moderate): Secondary | ICD-10-CM | POA: Diagnosis not present

## 2017-01-11 DIAGNOSIS — I129 Hypertensive chronic kidney disease with stage 1 through stage 4 chronic kidney disease, or unspecified chronic kidney disease: Secondary | ICD-10-CM | POA: Diagnosis not present

## 2017-01-11 DIAGNOSIS — W1789XA Other fall from one level to another, initial encounter: Secondary | ICD-10-CM | POA: Diagnosis not present

## 2017-01-11 DIAGNOSIS — W19XXXA Unspecified fall, initial encounter: Secondary | ICD-10-CM | POA: Diagnosis not present

## 2017-01-11 DIAGNOSIS — S22069D Unspecified fracture of T7-T8 vertebra, subsequent encounter for fracture with routine healing: Secondary | ICD-10-CM | POA: Diagnosis not present

## 2017-01-11 DIAGNOSIS — Y998 Other external cause status: Secondary | ICD-10-CM | POA: Diagnosis not present

## 2017-01-11 DIAGNOSIS — M5030 Other cervical disc degeneration, unspecified cervical region: Secondary | ICD-10-CM | POA: Diagnosis not present

## 2017-01-11 DIAGNOSIS — Y999 Unspecified external cause status: Secondary | ICD-10-CM | POA: Diagnosis not present

## 2017-01-11 DIAGNOSIS — M549 Dorsalgia, unspecified: Secondary | ICD-10-CM | POA: Diagnosis not present

## 2017-01-11 DIAGNOSIS — E039 Hypothyroidism, unspecified: Secondary | ICD-10-CM | POA: Diagnosis not present

## 2017-01-11 DIAGNOSIS — Y929 Unspecified place or not applicable: Secondary | ICD-10-CM | POA: Diagnosis not present

## 2017-01-11 DIAGNOSIS — S22081A Stable burst fracture of T11-T12 vertebra, initial encounter for closed fracture: Secondary | ICD-10-CM | POA: Diagnosis not present

## 2017-01-11 DIAGNOSIS — M5135 Other intervertebral disc degeneration, thoracolumbar region: Secondary | ICD-10-CM | POA: Diagnosis not present

## 2017-01-11 DIAGNOSIS — M8008XA Age-related osteoporosis with current pathological fracture, vertebra(e), initial encounter for fracture: Secondary | ICD-10-CM | POA: Diagnosis not present

## 2017-01-11 DIAGNOSIS — I700 Atherosclerosis of aorta: Secondary | ICD-10-CM | POA: Diagnosis not present

## 2017-01-11 DIAGNOSIS — M8580 Other specified disorders of bone density and structure, unspecified site: Secondary | ICD-10-CM | POA: Diagnosis not present

## 2017-01-11 DIAGNOSIS — Y9301 Activity, walking, marching and hiking: Secondary | ICD-10-CM | POA: Diagnosis not present

## 2017-01-11 DIAGNOSIS — Y939 Activity, unspecified: Secondary | ICD-10-CM | POA: Diagnosis not present

## 2017-01-11 MED ORDER — ONDANSETRON HCL 4 MG/2ML IJ SOLN
4.0000 mg | Freq: Once | INTRAMUSCULAR | Status: AC
  Administered 2017-01-11: 4 mg via INTRAVENOUS
  Filled 2017-01-11: qty 2

## 2017-01-11 MED ORDER — HYDROMORPHONE HCL 1 MG/ML IJ SOLN
0.5000 mg | Freq: Once | INTRAMUSCULAR | Status: AC
  Administered 2017-01-11: 0.5 mg via INTRAVENOUS
  Filled 2017-01-11: qty 1

## 2017-01-11 NOTE — ED Notes (Signed)
0.5mg  dilaudid wasted and witnessed with Medical laboratory scientific officer

## 2017-01-14 ENCOUNTER — Ambulatory Visit: Payer: Medicare Other | Admitting: Pain Medicine

## 2017-01-16 ENCOUNTER — Telehealth: Payer: Self-pay | Admitting: Family Medicine

## 2017-01-16 ENCOUNTER — Other Ambulatory Visit: Payer: Self-pay

## 2017-01-16 DIAGNOSIS — I714 Abdominal aortic aneurysm, without rupture, unspecified: Secondary | ICD-10-CM

## 2017-01-16 NOTE — Telephone Encounter (Signed)
Pt's daughter is thinking about 2 different referral and wants Dr. Raliegh Ip recommendation for orthopedic surgeon for her spine fracture has orthopedic surgeon in [place] but want something closer   and vascular surgeon for aneurysm. She will wait until weekend to see how patient is doing and call us back and referral is placed for [facility].

## 2017-01-16 NOTE — Telephone Encounter (Signed)
Pt's daughter, Arbie Cookey asked if Dr. Raliegh Ip could recommend an orthopedic for fracture in pt's spine.  She also had questions about referral to vascular for aneurysm in pt's stomach.  Please call [phone]

## 2017-01-16 NOTE — Telephone Encounter (Signed)
Closing encounter

## 2017-01-21 ENCOUNTER — Other Ambulatory Visit: Payer: Self-pay | Admitting: Orthopedic Surgery

## 2017-01-22 DIAGNOSIS — S22080A Wedge compression fracture of T11-T12 vertebra, initial encounter for closed fracture: Secondary | ICD-10-CM | POA: Diagnosis not present

## 2017-01-23 ENCOUNTER — Ambulatory Visit: Payer: Medicare Other

## 2017-01-23 ENCOUNTER — Ambulatory Visit: Payer: Medicare Other | Admitting: Certified Registered"

## 2017-01-23 ENCOUNTER — Ambulatory Visit
Admission: RE | Admit: 2017-01-23 | Discharge: 2017-01-23 | Disposition: A | Discharge status: Home / Self Care | Payer: Medicare Other | Source: Ambulatory Visit | Attending: Orthopedic Surgery | Admitting: Orthopedic Surgery

## 2017-01-23 ENCOUNTER — Encounter: Payer: Self-pay | Admitting: *Deleted

## 2017-01-23 ENCOUNTER — Encounter
Admission: RE | Disposition: A | Discharge status: Home / Self Care | Payer: Self-pay | Source: Ambulatory Visit | Attending: Orthopedic Surgery

## 2017-01-23 DIAGNOSIS — M5134 Other intervertebral disc degeneration, thoracic region: Secondary | ICD-10-CM | POA: Diagnosis not present

## 2017-01-23 DIAGNOSIS — Z79899 Other long term (current) drug therapy: Secondary | ICD-10-CM | POA: Diagnosis not present

## 2017-01-23 DIAGNOSIS — K219 Gastro-esophageal reflux disease without esophagitis: Secondary | ICD-10-CM | POA: Insufficient documentation

## 2017-01-23 DIAGNOSIS — E039 Hypothyroidism, unspecified: Secondary | ICD-10-CM | POA: Diagnosis not present

## 2017-01-23 DIAGNOSIS — M8580 Other specified disorders of bone density and structure, unspecified site: Secondary | ICD-10-CM | POA: Insufficient documentation

## 2017-01-23 DIAGNOSIS — S22080A Wedge compression fracture of T11-T12 vertebra, initial encounter for closed fracture: Secondary | ICD-10-CM | POA: Diagnosis not present

## 2017-01-23 DIAGNOSIS — X58XXXA Exposure to other specified factors, initial encounter: Secondary | ICD-10-CM | POA: Insufficient documentation

## 2017-01-23 DIAGNOSIS — I10 Essential (primary) hypertension: Secondary | ICD-10-CM | POA: Diagnosis not present

## 2017-01-23 DIAGNOSIS — F419 Anxiety disorder, unspecified: Secondary | ICD-10-CM | POA: Diagnosis not present

## 2017-01-23 DIAGNOSIS — Z419 Encounter for procedure for purposes other than remedying health state, unspecified: Secondary | ICD-10-CM

## 2017-01-23 DIAGNOSIS — M4854XA Collapsed vertebra, not elsewhere classified, thoracic region, initial encounter for fracture: Secondary | ICD-10-CM | POA: Diagnosis not present

## 2017-01-23 DIAGNOSIS — M4694 Unspecified inflammatory spondylopathy, thoracic region: Secondary | ICD-10-CM | POA: Insufficient documentation

## 2017-01-23 HISTORY — PX: KYPHOPLASTY: SHX5884

## 2017-01-23 SURGERY — KYPHOPLASTY
Anesthesia: General | Site: Back | Wound class: Clean

## 2017-01-23 MED ORDER — ONDANSETRON HCL 4 MG PO TABS: 4.0000 mg | ORAL_TABLET | Freq: Four times a day (QID) | ORAL | Status: DC | PRN

## 2017-01-23 MED ORDER — HYDROCODONE-ACETAMINOPHEN 5-325 MG PO TABS: 1.0000 | ORAL_TABLET | ORAL | Status: DC | PRN

## 2017-01-23 MED ORDER — BUPIVACAINE-EPINEPHRINE (PF) 0.5% -1:200000 IJ SOLN
INTRAMUSCULAR | Status: DC | PRN
  Administered 2017-01-23: 10 mL

## 2017-01-23 MED ORDER — CEFAZOLIN SODIUM-DEXTROSE 2-4 GM/100ML-% IV SOLN
INTRAVENOUS | Status: DC
Start: 2017-01-23 — End: 2017-01-23
  Filled 2017-01-23: qty 100

## 2017-01-23 MED ORDER — HYDROCODONE-ACETAMINOPHEN 5-325 MG PO TABS: 1.0000 | ORAL_TABLET | Freq: Four times a day (QID) | ORAL | 0 refills | Status: DC | PRN

## 2017-01-23 MED ORDER — METOCLOPRAMIDE HCL 5 MG/ML IJ SOLN: 5.0000 mg | Freq: Three times a day (TID) | INTRAMUSCULAR | Status: DC | PRN

## 2017-01-23 MED ORDER — PROPOFOL 10 MG/ML IV BOLUS
INTRAVENOUS | Status: DC | PRN
  Administered 2017-01-23 (×2): 20 mg via INTRAVENOUS

## 2017-01-23 MED ORDER — SODIUM CHLORIDE 0.9 % IV SOLN: INTRAVENOUS | Status: DC

## 2017-01-23 MED ORDER — GLYCOPYRROLATE 0.2 MG/ML IJ SOLN
INTRAMUSCULAR | Status: DC | PRN
Start: 2017-01-23 — End: 2017-01-23
  Administered 2017-01-23: 0.1 mg via INTRAVENOUS

## 2017-01-23 MED ORDER — ONDANSETRON HCL 4 MG/2ML IJ SOLN: 4.0000 mg | Freq: Four times a day (QID) | INTRAMUSCULAR | Status: DC | PRN

## 2017-01-23 MED ORDER — PROPOFOL 10 MG/ML IV BOLUS
INTRAVENOUS | Status: AC
  Filled 2017-01-23: qty 20

## 2017-01-23 MED ORDER — FENTANYL CITRATE (PF) 100 MCG/2ML IJ SOLN: 25.0000 ug | INTRAMUSCULAR | Status: DC | PRN

## 2017-01-23 MED ORDER — LIDOCAINE 2% (20 MG/ML) 5 ML SYRINGE
INTRAMUSCULAR | Status: DC | PRN
  Administered 2017-01-23: 25 mg via INTRAVENOUS

## 2017-01-23 MED ORDER — LIDOCAINE HCL 1 % IJ SOLN
INTRAMUSCULAR | Status: DC | PRN
  Administered 2017-01-23: 10 mL

## 2017-01-23 MED ORDER — LACTATED RINGERS IV SOLN
INTRAVENOUS | Status: DC
Start: 2017-01-23 — End: 2017-01-23
  Administered 2017-01-23: 15:00:00 via INTRAVENOUS

## 2017-01-23 MED ORDER — CEFAZOLIN SODIUM-DEXTROSE 2-3 GM-% IV SOLR
INTRAVENOUS | Status: DC | PRN
  Administered 2017-01-23: 2 g via INTRAVENOUS

## 2017-01-23 MED ORDER — FENTANYL CITRATE (PF) 100 MCG/2ML IJ SOLN
INTRAMUSCULAR | Status: DC | PRN
  Administered 2017-01-23: 25 ug via INTRAVENOUS
  Administered 2017-01-23: 50 ug via INTRAVENOUS
  Administered 2017-01-23: 25 ug via INTRAVENOUS

## 2017-01-23 MED ORDER — OXYCODONE HCL 5 MG PO TABS: 5.0000 mg | ORAL_TABLET | Freq: Once | ORAL | Status: DC | PRN

## 2017-01-23 MED ORDER — PROPOFOL 500 MG/50ML IV EMUL
INTRAVENOUS | Status: DC | PRN
  Administered 2017-01-23: 25 ug/kg/min via INTRAVENOUS

## 2017-01-23 MED ORDER — FENTANYL CITRATE (PF) 100 MCG/2ML IJ SOLN
INTRAMUSCULAR | Status: AC
  Filled 2017-01-23: qty 2

## 2017-01-23 MED ORDER — DEXMEDETOMIDINE HCL IN NACL 200 MCG/50ML IV SOLN
INTRAVENOUS | Status: DC | PRN
  Administered 2017-01-23: 8 ug via INTRAVENOUS

## 2017-01-23 MED ORDER — METOCLOPRAMIDE HCL 10 MG PO TABS: 5.0000 mg | ORAL_TABLET | Freq: Three times a day (TID) | ORAL | Status: DC | PRN

## 2017-01-23 MED ORDER — OXYCODONE HCL 5 MG/5ML PO SOLN: 5.0000 mg | Freq: Once | ORAL | Status: DC | PRN

## 2017-01-23 SURGICAL SUPPLY — 21 items
CEMENT BONE KYPHON CDS (Cement) ×2 IMPLANT
CEMENT KYPHON CX01A KIT/MIXER (Cement) ×2 IMPLANT
DERMABOND ADVANCED (GAUZE/BANDAGES/DRESSINGS) ×1
DERMABOND ADVANCED .7 DNX12 (GAUZE/BANDAGES/DRESSINGS) ×1 IMPLANT
DEVICE BIOPSY BONE KYPH ×2 IMPLANT
DEVICE BIOPSY BONE KYPHX (INSTRUMENTS) IMPLANT
DRAPE C-ARM XRAY 36X54 (DRAPES) ×2 IMPLANT
DURAPREP 26ML APPLICATOR (WOUND CARE) ×2 IMPLANT
GLOVE SURG SYN 9.0  PF PI (GLOVE) ×1
GLOVE SURG SYN 9.0 PF PI (GLOVE) ×1 IMPLANT
GOWN SRG 2XL LVL 4 RGLN SLV (GOWNS) ×1 IMPLANT
GOWN STRL NON-REIN 2XL LVL4 (GOWNS) ×1
GOWN STRL REUS W/ TWL LRG LVL3 (GOWN DISPOSABLE) ×1 IMPLANT
GOWN STRL REUS W/TWL LRG LVL3 (GOWN DISPOSABLE) ×1
KYPHONE XPEDE BONE CEMENT AND KYPHONE MIXER PACK IMPLANT
PACK KYPHOPLASTY (MISCELLANEOUS) ×2 IMPLANT
SIZE 2 KYPHON EXPRESS BONE BIOPSY IMPLANT
STRAP SAFETY BODY (MISCELLANEOUS) ×2 IMPLANT
TRAY KYPHOPAK 15/2 EXPRESS (KITS) ×2 IMPLANT
TRAY KYPHOPAK 15/3 EXPRESS 1ST (MISCELLANEOUS) IMPLANT
TRAY KYPHOPAK 20/3 EXPRESS 1ST (MISCELLANEOUS) IMPLANT

## 2017-01-23 NOTE — Op Note (Signed)
01/23/2017  4:45 PM  PATIENT:  Brittney Simpson  83 y.o. female  PRE-OPERATIVE DIAGNOSIS:  closed wedge compression fracture T12  POST-OPERATIVE DIAGNOSIS:  closed wedge compression fracture T12  PROCEDURE:  Procedure(s): KYPHOPLASTY-T12 (N/A)  SURGEON: Laurene Footman, MD  ASSISTANTS: None  ANESTHESIA:   local and MAC  EBL:  Total I/O In: 200 [I.V.:200] Out: -   BLOOD ADMINISTERED:none  DRAINS: none   LOCAL MEDICATIONS USED:  MARCAINE    and XYLOCAINE   SPECIMEN:  Source of Specimen:  T12 vertebral body  DISPOSITION OF SPECIMEN:  PATHOLOGY  COUNTS:  YES  TOURNIQUET:  * No tourniquets in log *  IMPLANTS: Bone cement  DICTATION: .Dragon Dictation  patient brought the operating room and after adequate sedation was given the patient was placed prone and C-arm brought in and with excellent visualization of T12. After patient identification and timeout procedure completed 5 cc 1% Xylocaine was infiltrated on both sides. Next the back was prepped and draped in sterile manner and repeat timeout procedure carried out. Spinal needle was used to get local anesthetic down to the pedicle on the right side with 10 cc of half percent Sensorcaine with epinephrine and 10 cc of Xylocaine on each side. A small incision was made on the right side and a trocar advanced in an extrapedicular fashion, biopsyobtained. The drilling was carried out followed by inflation of a balloon and essentially across the midline so second insertion site was not needed with about 1.5cc inflation there is also mildcorrection of the kyphotic deformity. The cement was then mixed and inserted when it was the appropriate consistency with 2cc of bone cement filling the vertebral body getting very good interdigitation and coverage from superior to inferior medial right to left sides. When the cement was set the trochar removed and permanent C-arm views obtained. Dermabond were used to close the skin followed by a  Band-Aid  PLAN OF CARE: Discharge to home after PACU  PATIENT DISPOSITION:  PACU - hemodynamically stable.

## 2017-01-23 NOTE — H&P (Signed)
Reviewed paper H+P, will be scanned into chart. No changes noted.  

## 2017-01-23 NOTE — Anesthesia Postprocedure Evaluation (Signed)
Anesthesia Post Note  Patient: Brittney Simpson  Procedure(s) Performed: Procedure(s) (LRB): KYPHOPLASTY-T12 (N/A)  Patient location during evaluation: PACU Anesthesia Type: General Level of consciousness: awake and alert and oriented Pain management: pain level controlled Vital Signs Assessment: post-procedure vital signs reviewed and stable Respiratory status: spontaneous breathing, nonlabored ventilation and respiratory function stable Cardiovascular status: blood pressure returned to baseline and stable Postop Assessment: no signs of nausea or vomiting Anesthetic complications: no     Last Vitals:  Vitals:   01/23/17 1713 01/23/17 1732  BP: (!) 161/81 (!) 187/76  Pulse: 92 67  Resp: 16 16  Temp:  36.2 C    Last Pain:  Vitals:   01/23/17 1732  TempSrc: Temporal  PainSc: 0-No pain                 Talulah Schirmer

## 2017-01-23 NOTE — Transfer of Care (Signed)
Immediate Anesthesia Transfer of Care Note  Patient: Brittney Simpson  Procedure(s) Performed: Procedure(s): KYPHOPLASTY-T12 (N/A)  Patient Location: PACU  Anesthesia Type:General  Level of Consciousness: awake  Airway & Oxygen Therapy: Patient Spontanous Breathing and Patient connected to nasal cannula oxygen  Post-op Assessment: Report given to RN and Post -op Vital signs reviewed and stable  Post vital signs: Reviewed  Last Vitals:  Vitals:   01/23/17 1642 01/23/17 1643  BP: (!) (P) 158/74 (!) 158/74  Pulse: (P) 77 74  Resp: (P) 12 18  Temp: (P) 36.5 C 37.6 C    Last Pain:  Vitals:   01/23/17 1504  TempSrc: Oral  PainSc: 7       Patients Stated Pain Goal: 2 (61/47/09 2957)  Complications: No apparent anesthesia complications

## 2017-01-23 NOTE — Anesthesia Preprocedure Evaluation (Signed)
Anesthesia Evaluation  Patient identified by MRN, date of birth, ID band Patient awake    Reviewed: Allergy & Precautions, H&P , NPO status , Patient's Chart, lab work & pertinent test results  History of Anesthesia Complications Negative for: history of anesthetic complications  Airway Mallampati: III  TM Distance: <3 FB Neck ROM: limited    Dental  (+) Poor Dentition, Missing, Upper Dentures, Lower Dentures   Pulmonary neg shortness of breath,           Cardiovascular hypertension, (-) angina(-) Past MI      Neuro/Psych  Neuromuscular disease negative psych ROS   GI/Hepatic Neg liver ROS, hiatal hernia, GERD  Medicated and Controlled,  Endo/Other  Hypothyroidism   Renal/GU Renal disease  negative genitourinary   Musculoskeletal  (+) Arthritis ,   Abdominal   Peds  Hematology negative hematology ROS (+)   Anesthesia Other Findings Past Medical History: No date: Anxiety No date: Back ache No date: Hiatal hernia No date: Hyperlipidemia No date: Hypertension No date: Hypothyroid No date: Osteopenia No date: Thyroid disease No date: UTI (urinary tract infection)  Past Surgical History: No date: HEMORRHOID SURGERY No date: HERNIA REPAIR No date: HERNIA REPAIR     Reproductive/Obstetrics negative OB ROS                             Anesthesia Physical Anesthesia Plan  ASA: III  Anesthesia Plan: General   Post-op Pain Management:    Induction: Intravenous  PONV Risk Score and Plan: 3 and Ondansetron, Dexamethasone, Propofol and Midazolam  Airway Management Planned: Natural Airway and Nasal Cannula  Additional Equipment:   Intra-op Plan:   Post-operative Plan:   Informed Consent: I have reviewed the patients History and Physical, chart, labs and discussed the procedure including the risks, benefits and alternatives for the proposed anesthesia with the patient or  authorized representative who has indicated his/her understanding and acceptance.   Dental Advisory Given  Plan Discussed with: Anesthesiologist, CRNA and Surgeon  Anesthesia Plan Comments: (Patient consented for risks of anesthesia including but not limited to:  - adverse reactions to medications - damage to teeth, lips or other oral mucosa - sore throat or hoarseness - Damage to heart, brain, lungs or loss of life  Patient voiced understanding.)        Anesthesia Quick Evaluation

## 2017-01-23 NOTE — Anesthesia Post-op Follow-up Note (Cosign Needed)
Anesthesia QCDR form completed.        

## 2017-01-23 NOTE — Discharge Instructions (Signed)
AMBULATORY SURGERY  DISCHARGE INSTRUCTIONS   1) The drugs that you were given will stay in your system until tomorrow so for the next 24 hours you should not:  A) Drive an automobile B) Make any legal decisions C) Drink any alcoholic beverage   2) You may resume regular meals tomorrow.  Today it is better to start with liquids and gradually work up to solid foods.  You may eat anything you prefer, but it is better to start with liquids, then soup and crackers, and gradually work up to solid foods.   3) Please notify your doctor immediately if you have any unusual bleeding, trouble breathing, redness and pain at the surgery site, drainage, fever, or pain not relieved by medication. 4)   5) Your post-operative visit with Dr.                                     is: Date:                        Time:    Please call to schedule your post-operative visit.  6) Additional Instructions: Remove Band-Aid on Saturday okay to shower after that but no baths or swimming for another week. Minimal activities today and tomorrow and then activities as tolerated

## 2017-01-24 ENCOUNTER — Encounter: Payer: Self-pay | Admitting: Orthopedic Surgery

## 2017-01-27 LAB — SURGICAL PATHOLOGY

## 2017-01-31 DIAGNOSIS — S22080A Wedge compression fracture of T11-T12 vertebra, initial encounter for closed fracture: Secondary | ICD-10-CM | POA: Diagnosis not present

## 2017-02-05 ENCOUNTER — Ambulatory Visit: Payer: Medicare Other | Admitting: Family Medicine

## 2017-02-05 DIAGNOSIS — I10 Essential (primary) hypertension: Secondary | ICD-10-CM | POA: Diagnosis not present

## 2017-02-05 DIAGNOSIS — M5137 Other intervertebral disc degeneration, lumbosacral region: Secondary | ICD-10-CM | POA: Diagnosis not present

## 2017-02-05 DIAGNOSIS — S22080D Wedge compression fracture of T11-T12 vertebra, subsequent encounter for fracture with routine healing: Secondary | ICD-10-CM | POA: Diagnosis not present

## 2017-02-05 DIAGNOSIS — F419 Anxiety disorder, unspecified: Secondary | ICD-10-CM | POA: Diagnosis not present

## 2017-02-05 DIAGNOSIS — M810 Age-related osteoporosis without current pathological fracture: Secondary | ICD-10-CM | POA: Diagnosis not present

## 2017-02-05 DIAGNOSIS — W19XXXD Unspecified fall, subsequent encounter: Secondary | ICD-10-CM | POA: Diagnosis not present

## 2017-02-07 DIAGNOSIS — W19XXXD Unspecified fall, subsequent encounter: Secondary | ICD-10-CM | POA: Diagnosis not present

## 2017-02-07 DIAGNOSIS — S22080D Wedge compression fracture of T11-T12 vertebra, subsequent encounter for fracture with routine healing: Secondary | ICD-10-CM | POA: Diagnosis not present

## 2017-02-07 DIAGNOSIS — M5137 Other intervertebral disc degeneration, lumbosacral region: Secondary | ICD-10-CM | POA: Diagnosis not present

## 2017-02-07 DIAGNOSIS — F419 Anxiety disorder, unspecified: Secondary | ICD-10-CM | POA: Diagnosis not present

## 2017-02-07 DIAGNOSIS — M810 Age-related osteoporosis without current pathological fracture: Secondary | ICD-10-CM | POA: Diagnosis not present

## 2017-02-07 DIAGNOSIS — I10 Essential (primary) hypertension: Secondary | ICD-10-CM | POA: Diagnosis not present

## 2017-02-11 ENCOUNTER — Ambulatory Visit (INDEPENDENT_AMBULATORY_CARE_PROVIDER_SITE_OTHER): Payer: Medicare Other | Admitting: Vascular Surgery

## 2017-02-11 ENCOUNTER — Encounter (INDEPENDENT_AMBULATORY_CARE_PROVIDER_SITE_OTHER): Payer: Self-pay | Admitting: Vascular Surgery

## 2017-02-11 VITALS — BP 160/82 | HR 64 | Resp 15 | Ht 66.0 in | Wt 128.0 lb

## 2017-02-11 DIAGNOSIS — I714 Abdominal aortic aneurysm, without rupture, unspecified: Secondary | ICD-10-CM

## 2017-02-11 DIAGNOSIS — N183 Chronic kidney disease, stage 3 unspecified: Secondary | ICD-10-CM

## 2017-02-11 DIAGNOSIS — G8929 Other chronic pain: Secondary | ICD-10-CM | POA: Diagnosis not present

## 2017-02-11 DIAGNOSIS — W19XXXD Unspecified fall, subsequent encounter: Secondary | ICD-10-CM | POA: Diagnosis not present

## 2017-02-11 DIAGNOSIS — M545 Low back pain: Secondary | ICD-10-CM | POA: Diagnosis not present

## 2017-02-11 DIAGNOSIS — M810 Age-related osteoporosis without current pathological fracture: Secondary | ICD-10-CM | POA: Diagnosis not present

## 2017-02-11 DIAGNOSIS — I10 Essential (primary) hypertension: Secondary | ICD-10-CM | POA: Diagnosis not present

## 2017-02-11 DIAGNOSIS — E782 Mixed hyperlipidemia: Secondary | ICD-10-CM

## 2017-02-11 DIAGNOSIS — M5137 Other intervertebral disc degeneration, lumbosacral region: Secondary | ICD-10-CM | POA: Diagnosis not present

## 2017-02-11 DIAGNOSIS — S22080D Wedge compression fracture of T11-T12 vertebra, subsequent encounter for fracture with routine healing: Secondary | ICD-10-CM | POA: Diagnosis not present

## 2017-02-11 DIAGNOSIS — F419 Anxiety disorder, unspecified: Secondary | ICD-10-CM | POA: Diagnosis not present

## 2017-02-11 NOTE — Assessment & Plan Note (Signed)
The patient has a known abdominal aortic aneurysm with conflicting sizes from 2 imaging studies about 8 months apart. Her CT scan of the abdomen and pelvis which I have independently reviewed demonstrates an approximately 3.6 cm infrarenal abdominal aortic aneurysm from last November. Her recent MRI study demonstrates what is estimated as a 4.5 cm abdominal aortic aneurysm last month. MRI is generally woefully at the precise measurement of aneurysms, and tends to overestimate the so I have a laid some of the patient's fears today. I think it is important to evaluate her aneurysm and a better detail and I would recommend a duplex study be done at her convenience in the near future. This would avoid contrast and help determine a better estimate of her aneurysm and the MRI would. Based off that study, we can determine future follow-up necessity

## 2017-02-11 NOTE — Assessment & Plan Note (Signed)
blood pressure control important in reducing the progression of atherosclerotic disease and aneurysmal degeneration. On appropriate oral medications.  

## 2017-02-11 NOTE — Assessment & Plan Note (Signed)
Sees nephrology. We will try to avoid any contrast administration unless the aneurysm is reaching a threshold size for consideration for repair

## 2017-02-11 NOTE — Assessment & Plan Note (Signed)
lipid control important in reducing the progression of atherosclerotic disease. Continue statin therapy  

## 2017-02-11 NOTE — Assessment & Plan Note (Signed)
This was the cause of her recent MRI which has shown [place] the conflicting reports to her previous CT scan.

## 2017-02-11 NOTE — Progress Notes (Signed)
Patient ID: Brittney Simpson, female   DOB: [birth year], 83 y.o.   MRN: [id number]  Chief Complaint  Patient presents with  . New Evaluation    AAA    HPI Brittney Simpson is a 83 y.o. female.  I am asked to see the patient by [name] for evaluation of AAA.  The patient reports Chronic back pain which she has had for many years. She does not have any signs of peripheral embolization. She does not have abdominal pain. The patient has an abdominal aortic aneurysm with conflicting sizes from 2 imaging studies about 8 months apart. Her CT scan of the abdomen and pelvis which I have independently reviewed demonstrates an approximately 3.6 cm infrarenal abdominal aortic aneurysm from last November. Her recent MRI study, which I have also reviewed, demonstrates what is estimated as a 4.5 cm abdominal aortic aneurysm last month. Given these findings and what appears to be dramatic growth, she is referred for further evaluation and treatment.   Past Medical History:  Diagnosis Date  . Anxiety   . Back ache   . Hiatal hernia   . Hyperlipidemia   . Hypertension   . Hypothyroid   . Osteopenia   . Thyroid disease   . UTI (urinary tract infection)     Past Surgical History:  Procedure Laterality Date  . HEMORRHOID SURGERY    . HERNIA REPAIR    . HERNIA REPAIR    . KYPHOPLASTY N/A 01/23/2017   Procedure: [id number];  Surgeon: Hessie Knows, MD;  Location: [facility] ORS;  Service: Orthopedics;  Laterality: N/A;    Family History  Problem Relation Age of Onset  . Stroke Mother   . Cancer Father    No bleeding or clotting disorders. No aneurysms  Social History Social History  Substance Use Topics  . Smoking status: Never Smoker  . Smokeless tobacco: Never Used  . Alcohol use No   No IV drug use  No Known Allergies  Current Outpatient Prescriptions  Medication Sig Dispense Refill  . acetaminophen (TYLENOL) 500 MG tablet Take 1,000 mg by mouth daily as needed for  moderate pain or headache.    Marland Kitchen amLODipine (NORVASC) 5 MG tablet Take 1 tablet (5 mg total) by mouth daily. (Patient taking differently: Take 5 mg by mouth daily with lunch. ) 30 tablet 12  . ASPERCREME LIDOCAINE EX Apply 1 application topically.    . brimonidine (ALPHAGAN) 0.2 % ophthalmic solution Place 1 drop into the left eye 2 (two) times daily.     . clonazePAM (KLONOPIN) 0.5 MG tablet TAKE 1/2 TABLET BY MOUTH TWICE DAILY FOR ANXIETY. 30 tablet 5  . cyclobenzaprine (FLEXERIL) 10 MG tablet Take 1 tablet (10 mg total) by mouth 3 (three) times daily as needed for muscle spasms. 30 tablet 1  . cyclobenzaprine (FLEXERIL) 5 MG tablet     . Ergocalciferol (VITAMIN D2) 2000 UNITS TABS Take 2,000 Units by mouth daily with lunch.     . furosemide (LASIX) 20 MG tablet Take 1 tablet (20 mg total) by mouth daily. 30 tablet 12  . HYDROcodone-acetaminophen (NORCO) 5-325 MG tablet Take 1 tablet by mouth every 6 (six) hours as needed for moderate pain. 15 tablet 0  . latanoprost (XALATAN) 0.005 % ophthalmic solution Place 1 drop into the left eye at bedtime.     Marland Kitchen levothyroxine (SYNTHROID, LEVOTHROID) 125 MCG tablet TAKE 1 TABLET BY MOUTH ONCE DAILY ON AN EMPTY STOMACH. WAIT 30 MINUTES BEFORE TAKING OTHER MEDS.  30 tablet 12  . lisinopril (PRINIVIL,ZESTRIL) 40 MG tablet Take 1 tablet (40 mg total) by mouth daily. 30 tablet 12  . metoprolol succinate (TOPROL-XL) 100 MG 24 hr tablet Take 1 tablet (100 mg total) by mouth daily. Take with or immediately following a meal. 30 tablet 11  . Omega-3 Fatty Acids (FISH OIL) 1000 MG CAPS Take 1,000 mg by mouth daily with lunch.    Marland Kitchen omeprazole (PRILOSEC) 20 MG capsule Take 1 capsule (20 mg total) by mouth daily. (Patient taking differently: Take 20 mg by mouth daily with lunch. ) 30 capsule 11  . ondansetron (ZOFRAN ODT) 4 MG disintegrating tablet Take 1 tablet (4 mg total) by mouth every 8 (eight) hours as needed for nausea or vomiting. 30 tablet 0  .  sulfamethoxazole-trimethoprim (BACTRIM) 400-80 MG tablet Take 1 tablet by mouth daily with supper.     . timolol (TIMOPTIC) 0.5 % ophthalmic solution Place 1 drop into the left eye 2 (two) times daily.     . traMADol (ULTRAM) 50 MG tablet Take 1 tablet (50 mg total) by mouth 3 (three) times daily as needed for severe pain. 10 tablet 0  . traZODone (DESYREL) 50 MG tablet Take 1 tablet (50 mg total) by mouth at bedtime. 30 tablet 11   No current facility-administered medications for this visit.       REVIEW OF SYSTEMS (Negative unless checked)  Constitutional: '[]' Weight loss  '[]' Fever  '[]' Chills Cardiac: '[]' Chest pain   '[]' Chest pressure   '[]' Palpitations   '[]' Shortness of breath when laying flat   '[]' Shortness of breath at rest   '[]' Shortness of breath with exertion. Vascular:  '[]' Pain in legs with walking   '[]' Pain in legs at rest   '[]' Pain in legs when laying flat   '[]' Claudication   '[]' Pain in feet when walking  '[]' Pain in feet at rest  '[]' Pain in feet when laying flat   '[]' History of DVT   '[]' Phlebitis   '[]' Swelling in legs   '[]' Varicose veins   '[]' Non-healing ulcers Pulmonary:   '[]' Uses home oxygen   '[]' Productive cough   '[]' Hemoptysis   '[]' Wheeze  '[]' COPD   '[]' Asthma Neurologic:  '[]' Dizziness  '[]' Blackouts   '[]' Seizures   '[]' History of stroke   '[]' History of TIA  '[]' Aphasia   '[]' Temporary blindness   '[]' Dysphagia   '[]' Weakness or numbness in arms   '[]' Weakness or numbness in legs Musculoskeletal:  '[x]' Arthritis   '[]' Joint swelling   '[]' Joint pain   '[x]' Low back pain Hematologic:  '[]' Easy bruising  '[]' Easy bleeding   '[]' Hypercoagulable state   '[]' Anemic  '[]' Hepatitis Gastrointestinal:  '[]' Blood in stool   '[]' Vomiting blood  '[]' Gastroesophageal reflux/heartburn   '[]' Abdominal pain Genitourinary:  '[]' Chronic kidney disease   '[]' Difficult urination  '[x]' Frequent urination  '[]' Burning with urination   '[]' Hematuria Skin:  '[]' Rashes   '[]' Ulcers   '[]' Wounds Psychological:  '[x]' History of anxiety   '[]'  History of major depression.    Physical Exam BP  (!) 160/82 (BP Location: Right Arm)   Pulse 64   Resp 15   Ht '5\' 6"'  (1.676 m)   Wt 128 lb (58.1 kg)   BMI 20.66 kg/m  Gen:  WD/WN, NAD. Appears younger than stated age Head: [place]/AT, No temporalis wasting.  Ear/Nose/Throat: Hearing diminished, nares w/o erythema or drainage, oropharynx w/o Erythema/Exudate Eyes: Conjunctiva clear, sclera non-icteric  Neck: trachea midline.  No bruit or JVD.  Pulmonary:  Good air movement, clear to auscultation bilaterally.  Cardiac: RRR, normal S1, S2 Vascular:  Vessel Right Left  Radial Palpable Palpable                          PT Palpable Palpable  DP Palpable Palpable   Gastrointestinal: soft, non-tender/non-distended. Aortic impulse mildly enlarged Musculoskeletal: M/S 5/5 throughout.  Extremities without ischemic changes.  No deformity or atrophy. No significant lower extremity edema. Neurologic: Sensation grossly intact in extremities.  Symmetrical.  Speech is fluent. Motor exam as listed above. Psychiatric: Judgment intact, Mood & affect appropriate for pt's clinical situation. Dermatologic: No rashes or ulcers noted.  No cellulitis or open wounds.    Radiology Dg Thoracic Spine 2 View  Result Date: 01/23/2017 CLINICAL DATA:  T12 kyphoplasty EXAM: THORACIC SPINE 2 VIEWS COMPARISON:  MR thoracic spine of 01/10/2017 FINDINGS: Two C-arm spot films were returned showing kyphoplasty at the T12 level. IMPRESSION: Kyphoplasty at T12. Electronically Signed   By: Ivar Drape M.D.   On: 01/23/2017 17:05   Dg C-arm 61-120 Min  Result Date: 01/23/2017 CLINICAL DATA:  T12 kyphoplasty EXAM: DG C-ARM 61-120 MIN COMPARISON:  MR thoracic spine of 01/10/2017 FINDINGS: C-arm fluoroscopy was provided during T12 kyphoplasty. Fluoroscopy time of 61 seconds was recorded. IMPRESSION: C-arm fluoroscopy provided. Electronically Signed   By: Ivar Drape M.D.   On: 01/23/2017 17:04    Labs Recent Results (from the past 2160 hour(s))  COMPLETE METABOLIC PANEL  WITH GFR     Status: None   Collection Time: 11/22/16  8:23 AM  Result Value Ref Range   Sodium 136 135 - 146 mmol/L   Potassium 4.2 3.5 - 5.3 mmol/L   Chloride 101 98 - 110 mmol/L   CO2 29 20 - 31 mmol/L   Glucose, Bld 92 65 - 99 mg/dL   BUN 14 7 - 25 mg/dL   Creat 0.86 0.60 - 0.88 mg/dL    Comment:   For patients > or = 81 years of age: The upper reference limit for Creatinine is approximately 13% higher for people identified as African-American.      Total Bilirubin 0.4 0.2 - 1.2 mg/dL   Alkaline Phosphatase 59 33 - 130 U/L   AST 11 10 - 35 U/L   ALT 9 6 - 29 U/L   Total Protein 6.2 6.1 - 8.1 g/dL   Albumin 3.6 3.6 - 5.1 g/dL   Calcium 8.9 8.6 - 10.4 mg/dL   GFR, Est African American 71 >=60 mL/min   GFR, Est Non African American 61 >=60 mL/min  Lipid panel     Status: Abnormal   Collection Time: 11/22/16  8:23 AM  Result Value Ref Range   Cholesterol 232 (H) <200 mg/dL   Triglycerides 99 <150 mg/dL   HDL 42 (L) >50 mg/dL   Total CHOL/HDL Ratio 5.5 (H) <5.0 Ratio   VLDL 20 <30 mg/dL   LDL Cholesterol 170 (H) <100 mg/dL  Hemoglobin A1c     Status: None   Collection Time: 11/22/16  8:23 AM  Result Value Ref Range   Hgb A1c MFr Bld 5.5 <5.7 %    Comment:   For the purpose of screening for the presence of diabetes:   <5.7%       Consistent with the absence of diabetes 5.7-6.4 %   Consistent with increased risk for diabetes (prediabetes) >=6.5 %     Consistent with diabetes   This assay result is consistent with a decreased risk of diabetes.   Currently, no consensus exists regarding use of hemoglobin A1c  for diagnosis of diabetes in children.   According to American Diabetes Association (ADA) guidelines, hemoglobin A1c <7.0% represents optimal control in non-pregnant diabetic patients. Different metrics may apply to specific patient populations. Standards of Medical Care in Diabetes (ADA).      Mean Plasma Glucose 111 mg/dL  TSH     Status: None   Collection  Time: 11/22/16  8:23 AM  Result Value Ref Range   TSH 0.63 mIU/L    Comment:   Reference Range   > or = 20 Years  0.40-4.50   Pregnancy Range First trimester  0.26-2.66 Second trimester 0.55-2.73 Third trimester  0.43-2.91     CBC with Differential/Platelet     Status: Abnormal   Collection Time: 11/22/16  8:23 AM  Result Value Ref Range   WBC 4.8 3.8 - 10.8 K/uL   RBC 4.91 3.80 - 5.10 MIL/uL   Hemoglobin 11.9 11.7 - 15.5 g/dL   HCT 38.8 35.0 - 45.0 %   MCV 79.0 (L) 80.0 - 100.0 fL   MCH 24.2 (L) 27.0 - 33.0 pg   MCHC 30.7 (L) 32.0 - 36.0 g/dL   RDW 15.3 (H) 11.0 - 15.0 %   Platelets 171 140 - 400 K/uL   MPV 9.8 7.5 - 12.5 fL   Neutro Abs 2,784 1,500 - 7,800 cells/uL   Lymphs Abs 1,392 850 - 3,900 cells/uL   Monocytes Absolute 624 200 - 950 cells/uL   Eosinophils Absolute 0 (L) 15 - 500 cells/uL   Basophils Absolute 0 0 - 200 cells/uL   Neutrophils Relative % 58 %   Lymphocytes Relative 29 %   Monocytes Relative 13 %   Eosinophils Relative 0 %   Basophils Relative 0 %   Smear Review Criteria for review not met   Surgical pathology     Status: None   Collection Time: 01/23/17  4:24 PM  Result Value Ref Range   SURGICAL PATHOLOGY      Surgical Pathology CASE: [id number] PATIENT: Lynnell Grain Surgical Pathology Report     SPECIMEN SUBMITTED: A. T12 bone; biopsy  CLINICAL HISTORY: None provided  PRE-OPERATIVE DIAGNOSIS: Closed wedge compression fracture  POST-OPERATIVE DIAGNOSIS: Same as pre-op     DIAGNOSIS: A. T12 BONE; BIOPSY: - CORE OF REACTIVE BONE AND MARROW STROMA CONSISTENT WITH PROVIDED CLINICAL HISTORY OF FRACTURE.   GROSS DESCRIPTION:  A. Labeled: T12 bone biopsy  Tissue fragment(s): 1  Size: 0.7 x 0.1 x 0.1 cm  Description: tan bony fragment  Entirely submitted in one cassette(s) following decalcification.    Final Diagnosis performed by Delorse Lek, MD.  Electronically signed 01/27/2017 10:33:18AM    The electronic  signature indicates that the named Attending Pathologist has evaluated the specimen  Technical component performed at [facility], [address], [place], [place] [address] Lab: [phone] Dir: Darrick Penna. Evette Doffing, MD  Professional component p erformed at [facility], [facility], [place], [place], [place] [address] Lab: [phone] Dir: Dellia Nims. Rubinas, MD      Assessment/Plan:  Essential hypertension blood pressure control important in reducing the progression of atherosclerotic disease and aneurysmal degeneration. On appropriate oral medications.   Hyperlipidemia lipid control important in reducing the progression of atherosclerotic disease. Continue statin therapy   Chronic low back pain (Bilateral) (R>L) This was the cause of her recent MRI which has shown [place] the conflicting reports to her previous CT scan.  CKD (chronic kidney disease) stage 3, GFR 30-59 ml/min Sees nephrology. We will try to avoid any contrast  administration unless the aneurysm is reaching a threshold size for consideration for repair  AAA (abdominal aortic aneurysm) without rupture ([place]) The patient has a known abdominal aortic aneurysm with conflicting sizes from 2 imaging studies about 8 months apart. Her CT scan of the abdomen and pelvis which I have independently reviewed demonstrates an approximately 3.6 cm infrarenal abdominal aortic aneurysm from last November. Her recent MRI study demonstrates what is estimated as a 4.5 cm abdominal aortic aneurysm last month. MRI is generally woefully at the precise measurement of aneurysms, and tends to overestimate the so I have a laid some of the patient's fears today. I think it is important to evaluate her aneurysm and a better detail and I would recommend a duplex study be done at her convenience in the near future. This would avoid contrast and help determine a better estimate of her aneurysm and the MRI would. Based off that study, we can  determine future follow-up necessity      Leotis Pain 02/11/2017, 11:54 AM   This note was created with Dragon medical transcription system.  Any errors from dictation are unintentional.

## 2017-02-11 NOTE — Patient Instructions (Signed)
Abdominal Aortic Aneurysm Blood pumps away from the heart through tubes (blood vessels) called arteries. Aneurysms are weak or damaged places in the wall of an artery. It bulges out like a balloon. An abdominal aortic aneurysm happens in the main artery of the body (aorta). It can burst or tear, causing bleeding inside the body. This is an emergency. It needs treatment right away. What are the causes? The exact cause is unknown. Things that could cause this problem include:  Fat and other substances building up in the lining of a tube.  Swelling of the walls of a blood vessel.  Certain tissue diseases.  Belly (abdominal) trauma.  An infection in the main artery of the body.  What increases the risk? There are things that make it more likely for you to have an aneurysm. These include:  Being over the age of 81 years old.  Having high blood pressure (hypertension).  Being a female.  Being white.  Being very overweight (obese).  Having a family history of aneurysm.  Using tobacco products.  What are the signs or symptoms? Symptoms depend on the size of the aneurysm and how fast it grows. There may not be symptoms. If symptoms occur, they can include:  Pain (belly, side, lower back, or groin).  Feeling full after eating a small amount of food.  Feeling sick to your stomach (nauseous), throwing up (vomiting), or both.  Feeling a lump in your belly that feels like it is beating (pulsating).  Feeling like you will pass out (faint).  How is this treated?  Medicine to control blood pressure and pain.  Imaging tests to see if the aneurysm gets bigger.  Surgery. How is this prevented? To lessen your chance of getting this condition:  Stop smoking. Stop chewing tobacco.  Limit or avoid alcohol.  Keep your blood pressure, blood sugar, and cholesterol within normal limits.  Eat less salt.  Eat foods low in saturated fats and cholesterol. These are found in animal and  whole dairy products.  Eat more fiber. Fiber is found in whole grains, vegetables, and fruits.  Keep a healthy weight.  Stay active and exercise often.  This information is not intended to replace advice given to you by your health care provider. Make sure you discuss any questions you have with your health care provider. Document Released: 11/09/2012 Document Revised: 12/21/2015 Document Reviewed: 08/14/2012 Elsevier Interactive Patient Education  2017 [employer]vier Inc.  

## 2017-02-13 DIAGNOSIS — M810 Age-related osteoporosis without current pathological fracture: Secondary | ICD-10-CM | POA: Diagnosis not present

## 2017-02-13 DIAGNOSIS — F419 Anxiety disorder, unspecified: Secondary | ICD-10-CM | POA: Diagnosis not present

## 2017-02-13 DIAGNOSIS — S22080D Wedge compression fracture of T11-T12 vertebra, subsequent encounter for fracture with routine healing: Secondary | ICD-10-CM | POA: Diagnosis not present

## 2017-02-13 DIAGNOSIS — M5137 Other intervertebral disc degeneration, lumbosacral region: Secondary | ICD-10-CM | POA: Diagnosis not present

## 2017-02-13 DIAGNOSIS — I10 Essential (primary) hypertension: Secondary | ICD-10-CM | POA: Diagnosis not present

## 2017-02-13 DIAGNOSIS — W19XXXD Unspecified fall, subsequent encounter: Secondary | ICD-10-CM | POA: Diagnosis not present

## 2017-02-18 ENCOUNTER — Telehealth: Payer: Self-pay | Admitting: Family Medicine

## 2017-02-18 DIAGNOSIS — F419 Anxiety disorder, unspecified: Secondary | ICD-10-CM | POA: Diagnosis not present

## 2017-02-18 DIAGNOSIS — M810 Age-related osteoporosis without current pathological fracture: Secondary | ICD-10-CM | POA: Diagnosis not present

## 2017-02-18 DIAGNOSIS — W19XXXD Unspecified fall, subsequent encounter: Secondary | ICD-10-CM | POA: Diagnosis not present

## 2017-02-18 DIAGNOSIS — N390 Urinary tract infection, site not specified: Secondary | ICD-10-CM

## 2017-02-18 DIAGNOSIS — M5137 Other intervertebral disc degeneration, lumbosacral region: Secondary | ICD-10-CM | POA: Diagnosis not present

## 2017-02-18 DIAGNOSIS — S22080D Wedge compression fracture of T11-T12 vertebra, subsequent encounter for fracture with routine healing: Secondary | ICD-10-CM | POA: Diagnosis not present

## 2017-02-18 DIAGNOSIS — I10 Essential (primary) hypertension: Secondary | ICD-10-CM | POA: Diagnosis not present

## 2017-02-18 MED ORDER — CIPROFLOXACIN HCL 500 MG PO TABS: 500.0000 mg | ORAL_TABLET | Freq: Two times a day (BID) | ORAL | 2 refills | Status: DC

## 2017-02-18 NOTE — Telephone Encounter (Signed)
The pt daughter called requesting a prescription of Ciprofloxacin be sent to [facility] for her mother.The pt complaining of dysuria x 1 day. [name] (daughter)#  [phone].

## 2017-02-18 NOTE — Telephone Encounter (Signed)
Brittney Simpson asked for a refill on cipro sent to [facility].  She doesn't have any refills left on prescription.  Her call back number is [phone]

## 2017-02-18 NOTE — Telephone Encounter (Signed)
Reviewed chart, last rx by me in 09/2016, with Cipro 500mg  BID #10 pills +2 refills to take PRN only if breakthrough UTI in setting of known recurrent UTI on bactrim prophylaxis from Nephrology. She had done well and only used 3 rounds of Cipro in almost 5 months. Now requesting refill, with early UTI symptoms dysuria. She has been advised in past when to seek medical attention for UTI and when to go to hospital if significant worsening.  Would recommend return to office visit before using 2nd refill next time for UA / urine culture. Or can follow-up with Nephrology Dr Juleen China.  Nobie Putnam, [facility] 02/18/2017, 5:13 PM

## 2017-02-19 NOTE — Telephone Encounter (Signed)
The pt daughter notified. No questions or concerns.

## 2017-02-20 DIAGNOSIS — I10 Essential (primary) hypertension: Secondary | ICD-10-CM | POA: Diagnosis not present

## 2017-02-20 DIAGNOSIS — W19XXXD Unspecified fall, subsequent encounter: Secondary | ICD-10-CM | POA: Diagnosis not present

## 2017-02-20 DIAGNOSIS — S22080D Wedge compression fracture of T11-T12 vertebra, subsequent encounter for fracture with routine healing: Secondary | ICD-10-CM | POA: Diagnosis not present

## 2017-02-20 DIAGNOSIS — F419 Anxiety disorder, unspecified: Secondary | ICD-10-CM | POA: Diagnosis not present

## 2017-02-20 DIAGNOSIS — M5137 Other intervertebral disc degeneration, lumbosacral region: Secondary | ICD-10-CM | POA: Diagnosis not present

## 2017-02-20 DIAGNOSIS — M810 Age-related osteoporosis without current pathological fracture: Secondary | ICD-10-CM | POA: Diagnosis not present

## 2017-02-21 ENCOUNTER — Other Ambulatory Visit: Payer: Self-pay | Admitting: Family Medicine

## 2017-02-21 DIAGNOSIS — M5442 Lumbago with sciatica, left side: Secondary | ICD-10-CM

## 2017-02-21 DIAGNOSIS — G8929 Other chronic pain: Secondary | ICD-10-CM

## 2017-02-21 DIAGNOSIS — M47816 Spondylosis without myelopathy or radiculopathy, lumbar region: Secondary | ICD-10-CM

## 2017-02-26 ENCOUNTER — Ambulatory Visit (INDEPENDENT_AMBULATORY_CARE_PROVIDER_SITE_OTHER): Payer: Medicare Other

## 2017-02-26 DIAGNOSIS — N183 Chronic kidney disease, stage 3 (moderate): Secondary | ICD-10-CM | POA: Diagnosis not present

## 2017-02-26 DIAGNOSIS — E871 Hypo-osmolality and hyponatremia: Secondary | ICD-10-CM | POA: Diagnosis not present

## 2017-02-26 DIAGNOSIS — I714 Abdominal aortic aneurysm, without rupture, unspecified: Secondary | ICD-10-CM

## 2017-02-26 DIAGNOSIS — N390 Urinary tract infection, site not specified: Secondary | ICD-10-CM | POA: Diagnosis not present

## 2017-02-26 DIAGNOSIS — I129 Hypertensive chronic kidney disease with stage 1 through stage 4 chronic kidney disease, or unspecified chronic kidney disease: Secondary | ICD-10-CM | POA: Diagnosis not present

## 2017-03-04 ENCOUNTER — Encounter (INDEPENDENT_AMBULATORY_CARE_PROVIDER_SITE_OTHER): Payer: Self-pay | Admitting: Vascular Surgery

## 2017-03-04 ENCOUNTER — Ambulatory Visit (INDEPENDENT_AMBULATORY_CARE_PROVIDER_SITE_OTHER): Payer: Medicare Other | Admitting: Vascular Surgery

## 2017-03-04 VITALS — BP 168/75 | HR 64 | Resp 16 | Wt 128.0 lb

## 2017-03-04 DIAGNOSIS — I714 Abdominal aortic aneurysm, without rupture, unspecified: Secondary | ICD-10-CM

## 2017-03-04 DIAGNOSIS — I10 Essential (primary) hypertension: Secondary | ICD-10-CM | POA: Diagnosis not present

## 2017-03-04 DIAGNOSIS — N183 Chronic kidney disease, stage 3 unspecified: Secondary | ICD-10-CM

## 2017-03-04 NOTE — Progress Notes (Signed)
MRN : [id number]  Brittney Simpson is a 83 y.o. (07-23-1928) female who presents with chief complaint of  Chief Complaint  Patient presents with  . Follow-up  .  History of Present Illness: Patient returns today in follow up of AAA. She has no new symptoms. She has chronic low back pain. She is doing well today. Her aortic duplex demonstrates an approximately 2.9-3 cm infrarenal abdominal aortic aneurysm. This is a little smaller than would be expected but much more in line with her CT scan from last year. The MRI from earlier this year is clearly an inaccurate way to measure an aneurysm.       Past Medical History:  Diagnosis Date  . Anxiety   . Back ache   . Hiatal hernia   . Hyperlipidemia   . Hypertension   . Hypothyroid   . Osteopenia   . Thyroid disease   . UTI (urinary tract infection)          Past Surgical History:  Procedure Laterality Date  . HEMORRHOID SURGERY    . HERNIA REPAIR    . HERNIA REPAIR    . KYPHOPLASTY N/A 01/23/2017   Procedure: [id number];  Surgeon: Hessie Knows, MD;  Location: [facility] ORS;  Service: Orthopedics;  Laterality: N/A;         Family History  Problem Relation Age of Onset  . Stroke Mother   . Cancer Father    No bleeding or clotting disorders. No aneurysms  Social History Social History  Substance Use Topics  . Smoking status: Never Smoker  . Smokeless tobacco: Never Used  . Alcohol use No   No IV drug use  No Known Allergies        Current Outpatient Prescriptions  Medication Sig Dispense Refill  . acetaminophen (TYLENOL) 500 MG tablet Take 1,000 mg by mouth daily as needed for moderate pain or headache.    Marland Kitchen amLODipine (NORVASC) 5 MG tablet Take 1 tablet (5 mg total) by mouth daily. (Patient taking differently: Take 5 mg by mouth daily with lunch. ) 30 tablet 12  . ASPERCREME LIDOCAINE EX Apply 1 application topically.    . brimonidine (ALPHAGAN) 0.2 % ophthalmic solution Place  1 drop into the left eye 2 (two) times daily.     . clonazePAM (KLONOPIN) 0.5 MG tablet TAKE 1/2 TABLET BY MOUTH TWICE DAILY FOR ANXIETY. 30 tablet 5  . cyclobenzaprine (FLEXERIL) 10 MG tablet Take 1 tablet (10 mg total) by mouth 3 (three) times daily as needed for muscle spasms. 30 tablet 1  . cyclobenzaprine (FLEXERIL) 5 MG tablet     . Ergocalciferol (VITAMIN D2) 2000 UNITS TABS Take 2,000 Units by mouth daily with lunch.     . furosemide (LASIX) 20 MG tablet Take 1 tablet (20 mg total) by mouth daily. 30 tablet 12  . HYDROcodone-acetaminophen (NORCO) 5-325 MG tablet Take 1 tablet by mouth every 6 (six) hours as needed for moderate pain. 15 tablet 0  . latanoprost (XALATAN) 0.005 % ophthalmic solution Place 1 drop into the left eye at bedtime.     Marland Kitchen levothyroxine (SYNTHROID, LEVOTHROID) 125 MCG tablet TAKE 1 TABLET BY MOUTH ONCE DAILY ON AN EMPTY STOMACH. WAIT 30 MINUTES BEFORE TAKING OTHER MEDS. 30 tablet 12  . lisinopril (PRINIVIL,ZESTRIL) 40 MG tablet Take 1 tablet (40 mg total) by mouth daily. 30 tablet 12  . metoprolol succinate (TOPROL-XL) 100 MG 24 hr tablet Take 1 tablet (100 mg total) by mouth daily. Take  with or immediately following a meal. 30 tablet 11  . Omega-3 Fatty Acids (FISH OIL) 1000 MG CAPS Take 1,000 mg by mouth daily with lunch.    Marland Kitchen omeprazole (PRILOSEC) 20 MG capsule Take 1 capsule (20 mg total) by mouth daily. (Patient taking differently: Take 20 mg by mouth daily with lunch. ) 30 capsule 11  . ondansetron (ZOFRAN ODT) 4 MG disintegrating tablet Take 1 tablet (4 mg total) by mouth every 8 (eight) hours as needed for nausea or vomiting. 30 tablet 0  . sulfamethoxazole-trimethoprim (BACTRIM) 400-80 MG tablet Take 1 tablet by mouth daily with supper.     . timolol (TIMOPTIC) 0.5 % ophthalmic solution Place 1 drop into the left eye 2 (two) times daily.     . traMADol (ULTRAM) 50 MG tablet Take 1 tablet (50 mg total) by mouth 3 (three) times daily as needed for  severe pain. 10 tablet 0  . traZODone (DESYREL) 50 MG tablet Take 1 tablet (50 mg total) by mouth at bedtime. 30 tablet 11   No current facility-administered medications for this visit.       REVIEW OF SYSTEMS (Negative unless checked)  Constitutional: [] Weight loss  [] Fever  [] Chills Cardiac: [] Chest pain   [] Chest pressure   [] Palpitations   [] Shortness of breath when laying flat   [] Shortness of breath at rest   [] Shortness of breath with exertion. Vascular:  [] Pain in legs with walking   [] Pain in legs at rest   [] Pain in legs when laying flat   [] Claudication   [] Pain in feet when walking  [] Pain in feet at rest  [] Pain in feet when laying flat   [] History of DVT   [] Phlebitis   [] Swelling in legs   [] Varicose veins   [] Non-healing ulcers Pulmonary:   [] Uses home oxygen   [] Productive cough   [] Hemoptysis   [] Wheeze  [] COPD   [] Asthma Neurologic:  [] Dizziness  [] Blackouts   [] Seizures   [] History of stroke   [] History of TIA  [] Aphasia   [] Temporary blindness   [] Dysphagia   [] Weakness or numbness in arms   [] Weakness or numbness in legs Musculoskeletal:  [x] Arthritis   [] Joint swelling   [] Joint pain   [x] Low back pain Hematologic:  [] Easy bruising  [] Easy bleeding   [] Hypercoagulable state   [] Anemic  [] Hepatitis Gastrointestinal:  [] Blood in stool   [] Vomiting blood  [] Gastroesophageal reflux/heartburn   [] Abdominal pain Genitourinary:  [] Chronic kidney disease   [] Difficult urination  [x] Frequent urination  [] Burning with urination   [] Hematuria Skin:  [] Rashes   [] Ulcers   [] Wounds Psychological:  [x] History of anxiety   []  History of major depression   Physical Examination  BP (!) 168/75   Pulse 64   Resp 16   Wt 128 lb (58.1 kg)   BMI 20.66 kg/m  Gen:  WD/WN, NAD Head: [place]/AT, No temporalis wasting. Ear/Nose/Throat: Hearing grossly intact, nares w/o erythema or drainage, trachea midline Eyes: Conjunctiva clear. Sclera non-icteric Neck: Supple.  No JVD.  Pulmonary:   Good air movement, no use of accessory muscles.  Cardiac: RRR, normal S1, S2 Vascular:  Vessel Right Left  Radial Palpable Palpable                                   Gastrointestinal: soft, non-tender/non-distended. Aortic impulse mildly enlarged Musculoskeletal: M/S 5/5 throughout.  No deformity or atrophy.  Neurologic: Sensation grossly intact in extremities.  Symmetrical.  Speech is fluent.  Psychiatric: Judgment intact, Mood & affect appropriate for pt's clinical situation. Dermatologic: No rashes or ulcers noted.  No cellulitis or open wounds. Lymph : No Cervical, Axillary, or Inguinal lymphadenopathy.      Labs Recent Results (from the past 2160 hour(s))  Surgical pathology     Status: None   Collection Time: 01/23/17  4:24 PM  Result Value Ref Range   SURGICAL PATHOLOGY      Surgical Pathology CASE: [id number] PATIENT: Lynnell Grain Surgical Pathology Report     SPECIMEN SUBMITTED: A. T12 bone; biopsy  CLINICAL HISTORY: None provided  PRE-OPERATIVE DIAGNOSIS: Closed wedge compression fracture  POST-OPERATIVE DIAGNOSIS: Same as pre-op     DIAGNOSIS: A. T12 BONE; BIOPSY: - CORE OF REACTIVE BONE AND MARROW STROMA CONSISTENT WITH PROVIDED CLINICAL HISTORY OF FRACTURE.   GROSS DESCRIPTION:  A. Labeled: T12 bone biopsy  Tissue fragment(s): 1  Size: 0.7 x 0.1 x 0.1 cm  Description: tan bony fragment  Entirely submitted in one cassette(s) following decalcification.    Final Diagnosis performed by Delorse Lek, MD.  Electronically signed 01/27/2017 10:33:18AM    The electronic signature indicates that the named Attending Pathologist has evaluated the specimen  Technical component performed at [facility], [address], [place], [place] [address] Lab: [phone] Dir: Darrick Penna. Evette Doffing, MD  Professional component p erformed at [facility], [facility], [place], [place], [place] [address] Lab: [phone] Dir:  Dellia Nims. Reuel Derby, MD      Radiology No results found.    Assessment/Plan Essential hypertension blood pressure control important in reducing the progression of atherosclerotic disease and aneurysmal degeneration. On appropriate oral medications.   Hyperlipidemia lipid control important in reducing the progression of atherosclerotic disease. Continue statin therapy   Chronic low back pain (Bilateral) (R>L) This was the cause of her recent MRI which has shown [place] the conflicting reports to her previous CT scan.  CKD (chronic kidney disease) stage 3, GFR 30-59 ml/min Sees nephrology. We will try to avoid any contrast administration unless the aneurysm is reaching a threshold size for consideration for repair  AAA (abdominal aortic aneurysm) without rupture (HCC) Her aortic duplex demonstrates an approximately 2.9-3 cm infrarenal abdominal aortic aneurysm. This is a little smaller than would be expected but much more in line with her CT scan from last year. The MRI from earlier this year is clearly an inaccurate way to measure an aneurysm. At this point, I think we are safe on checking her on an annual basis with duplex. She will contact our office with any problems in the interim.    Leotis Pain, MD  03/04/2017 12:28 PM    This note was created with Dragon medical transcription system.  Any errors from dictation are purely unintentional

## 2017-03-04 NOTE — Patient Instructions (Signed)
Abdominal Aortic Aneurysm Blood pumps away from the heart through tubes (blood vessels) called arteries. Aneurysms are weak or damaged places in the wall of an artery. It bulges out like a balloon. An abdominal aortic aneurysm happens in the main artery of the body (aorta). It can burst or tear, causing bleeding inside the body. This is an emergency. It needs treatment right away. What are the causes? The exact cause is unknown. Things that could cause this problem include:  Fat and other substances building up in the lining of a tube.  Swelling of the walls of a blood vessel.  Certain tissue diseases.  Belly (abdominal) trauma.  An infection in the main artery of the body.  What increases the risk? There are things that make it more likely for you to have an aneurysm. These include:  Being over the age of 81 years old.  Having high blood pressure (hypertension).  Being a female.  Being white.  Being very overweight (obese).  Having a family history of aneurysm.  Using tobacco products.  What are the signs or symptoms? Symptoms depend on the size of the aneurysm and how fast it grows. There may not be symptoms. If symptoms occur, they can include:  Pain (belly, side, lower back, or groin).  Feeling full after eating a small amount of food.  Feeling sick to your stomach (nauseous), throwing up (vomiting), or both.  Feeling a lump in your belly that feels like it is beating (pulsating).  Feeling like you will pass out (faint).  How is this treated?  Medicine to control blood pressure and pain.  Imaging tests to see if the aneurysm gets bigger.  Surgery. How is this prevented? To lessen your chance of getting this condition:  Stop smoking. Stop chewing tobacco.  Limit or avoid alcohol.  Keep your blood pressure, blood sugar, and cholesterol within normal limits.  Eat less salt.  Eat foods low in saturated fats and cholesterol. These are found in animal and  whole dairy products.  Eat more fiber. Fiber is found in whole grains, vegetables, and fruits.  Keep a healthy weight.  Stay active and exercise often.  This information is not intended to replace advice given to you by your health care provider. Make sure you discuss any questions you have with your health care provider. Document Released: 11/09/2012 Document Revised: 12/21/2015 Document Reviewed: 08/14/2012 Elsevier Interactive Patient Education  2017 [employer]vier Inc.  

## 2017-03-04 NOTE — Assessment & Plan Note (Signed)
Her aortic duplex demonstrates an approximately 2.9-3 cm infrarenal abdominal aortic aneurysm. This is a little smaller than would be expected but much more in line with her CT scan from last year. The MRI from earlier this year is clearly an inaccurate way to measure an aneurysm. At this point, I think we are safe on checking her on an annual basis with duplex. She will contact our office with any problems in the interim.

## 2017-03-07 DIAGNOSIS — Z9889 Other specified postprocedural states: Secondary | ICD-10-CM | POA: Diagnosis not present

## 2017-03-11 ENCOUNTER — Telehealth: Payer: Self-pay

## 2017-03-11 ENCOUNTER — Telehealth: Payer: Self-pay | Admitting: *Deleted

## 2017-03-11 NOTE — Telephone Encounter (Signed)
Called patients daughter and instructed her on the pre procedure instructions. Patient is not having sedation, so informed that she needs to be NPO at least 3 hours before procedure and take take meds with sip of water.

## 2017-03-11 NOTE — Telephone Encounter (Signed)
Called patients daughter and instructed on the pre procedure instructions. Patient will not be getting sedation. Instructed her that she should be NPO 3 hours before procedure. Pt can have sip of water with medications.

## 2017-03-25 ENCOUNTER — Ambulatory Visit (HOSPITAL_BASED_OUTPATIENT_CLINIC_OR_DEPARTMENT_OTHER): Payer: Medicare Other | Admitting: Pain Medicine

## 2017-03-25 ENCOUNTER — Ambulatory Visit
Admission: RE | Admit: 2017-03-25 | Discharge: 2017-03-25 | Disposition: A | Discharge status: Home / Self Care | Payer: Medicare Other | Source: Ambulatory Visit | Attending: Pain Medicine | Admitting: Pain Medicine

## 2017-03-25 VITALS — BP 185/83 | HR 60 | Temp 98.2°F | Resp 12 | Ht 66.0 in | Wt 128.0 lb

## 2017-03-25 DIAGNOSIS — G8929 Other chronic pain: Secondary | ICD-10-CM | POA: Diagnosis not present

## 2017-03-25 DIAGNOSIS — M47816 Spondylosis without myelopathy or radiculopathy, lumbar region: Secondary | ICD-10-CM

## 2017-03-25 DIAGNOSIS — M545 Low back pain, unspecified: Secondary | ICD-10-CM

## 2017-03-25 DIAGNOSIS — M4696 Unspecified inflammatory spondylopathy, lumbar region: Secondary | ICD-10-CM | POA: Diagnosis not present

## 2017-03-25 MED ORDER — MIDAZOLAM HCL 5 MG/5ML IJ SOLN: 1.0000 mg | INTRAMUSCULAR | Status: DC | PRN

## 2017-03-25 MED ORDER — LACTATED RINGERS IV SOLN: 1000.0000 mL | Freq: Once | INTRAVENOUS | Status: DC

## 2017-03-25 MED ORDER — ROPIVACAINE HCL 2 MG/ML IJ SOLN
9.0000 mL | Freq: Once | INTRAMUSCULAR | Status: AC
  Administered 2017-03-25: 9 mL via PERINEURAL
  Filled 2017-03-25: qty 10

## 2017-03-25 MED ORDER — TRIAMCINOLONE ACETONIDE 40 MG/ML IJ SUSP
40.0000 mg | Freq: Once | INTRAMUSCULAR | Status: AC
  Administered 2017-03-25: 40 mg
  Filled 2017-03-25: qty 1

## 2017-03-25 MED ORDER — FENTANYL CITRATE (PF) 100 MCG/2ML IJ SOLN: 25.0000 ug | INTRAMUSCULAR | Status: DC | PRN

## 2017-03-25 MED ORDER — LIDOCAINE HCL 2 % IJ SOLN
10.0000 mL | Freq: Once | INTRAMUSCULAR | Status: AC
  Administered 2017-03-25: 400 mg
  Filled 2017-03-25: qty 20

## 2017-03-25 NOTE — Progress Notes (Signed)
Safety precautions to be maintained throughout the outpatient stay will include: orient to surroundings, keep bed in low position, maintain call bell within reach at all times, provide assistance with transfer out of bed and ambulation.  

## 2017-03-25 NOTE — Progress Notes (Signed)
Patient's Name: Brittney Simpson  MRN: [id number]  Referring Provider: Nobie Putnam *  DOB: November 14, 1927  PCP: Olin Hauser, DO  DOS: 03/25/2017  Note by: Gaspar Cola, MD  Service setting: Ambulatory outpatient  Specialty: Interventional Pain Management  Patient type: Established  Location: [facility] (AMB) Pain Management Facility  Visit type: Interventional Procedure   Primary Reason for Visit: Interventional Pain Management Treatment. CC: Back Pain (lower)  Procedure:  Anesthesia, Analgesia, Anxiolysis:  Type: Diagnostic Medial Branch Facet Block Region: Lumbar Level: L2, L3, L4, L5, & S1 Medial Branch Level(s) Laterality: Bilateral  Type: Local Anesthesia Local Anesthetic: Lidocaine 1% Route: Infiltration ([place]/IM) IV Access: Declined Sedation: Declined  Indication(s): Analgesia          Indications: 1. Lumbar facet syndrome (Bilateral) (R>L)   2. Lumbar spondylosis   3. Chronic low back pain (Bilateral) (R>L)    Pain Score: Pre-procedure: 8 /10 Post-procedure: 8 /10  Pre-op Assessment:  Brittney Simpson is a 83 y.o. (year old), female patient, seen today for interventional treatment. She  has a past surgical history that includes Hernia repair; Hernia repair; Hemorrhoid surgery; and Kyphoplasty (N/A, 01/23/2017). Brittney Simpson has a current medication list which includes the following prescription(s): acetaminophen, amlodipine, lidocaine, brimonidine, clonazepam, vitamin d2, furosemide, latanoprost, levothyroxine, lisinopril, metoprolol succinate, omeprazole, sulfamethoxazole-trimethoprim, timolol, tramadol, and trazodone. Her primarily concern today is the Back Pain (lower)  Initial Vital Signs: Blood pressure (!) 176/72, pulse 60, temperature 98.2 F (36.8 C), temperature source Oral, resp. rate 14, height 5\' 6"  (1.676 m), weight 128 lb (58.1 kg), SpO2 96 %. BMI: Estimated body mass index is 20.66 kg/m as calculated from the following:   Height as of this  encounter: 5\' 6"  (1.676 m).   Weight as of this encounter: 128 lb (58.1 kg).  Risk Assessment: Allergies: Reviewed. She has No Known Allergies.  Allergy Precautions: None required Coagulopathies: Reviewed. None identified.  Blood-thinner therapy: None at this time Active Infection(s): Reviewed. None identified. Brittney Simpson is afebrile  Site Confirmation: Brittney Simpson was asked to confirm the procedure and laterality before marking the site Procedure checklist: Completed Consent: Before the procedure and under the influence of no sedative(s), amnesic(s), or anxiolytics, the patient was informed of the treatment options, risks and possible complications. To fulfill our ethical and legal obligations, as recommended by the American Medical Association's Code of Ethics, I have informed the patient of my clinical impression; the nature and purpose of the treatment or procedure; the risks, benefits, and possible complications of the intervention; the alternatives, including doing nothing; the risk(s) and benefit(s) of the alternative treatment(s) or procedure(s); and the risk(s) and benefit(s) of doing nothing. The patient was provided information about the general risks and possible complications associated with the procedure. These may include, but are not limited to: failure to achieve desired goals, infection, bleeding, organ or nerve damage, allergic reactions, paralysis, and death. In addition, the patient was informed of those risks and complications associated to Spine-related procedures, such as failure to decrease pain; infection (i.e.: Meningitis, epidural or intraspinal abscess); bleeding (i.e.: epidural hematoma, subarachnoid hemorrhage, or any other type of intraspinal or peri-dural bleeding); organ or nerve damage (i.e.: Any type of peripheral nerve, nerve root, or spinal cord injury) with subsequent damage to sensory, motor, and/or autonomic systems, resulting in permanent pain, numbness,  and/or weakness of one or several areas of the body; allergic reactions; (i.e.: anaphylactic reaction); and/or death. Furthermore, the patient was informed of those risks and complications associated with the  medications. These include, but are not limited to: allergic reactions (i.e.: anaphylactic or anaphylactoid reaction(s)); adrenal axis suppression; blood sugar elevation that in diabetics may result in ketoacidosis or comma; water retention that in patients with history of congestive heart failure may result in shortness of breath, pulmonary edema, and decompensation with resultant heart failure; weight gain; swelling or edema; medication-induced neural toxicity; particulate matter embolism and blood vessel occlusion with resultant organ, and/or nervous system infarction; and/or aseptic necrosis of one or more joints. Finally, the patient was informed that Medicine is not an exact science; therefore, there is also the possibility of unforeseen or unpredictable risks and/or possible complications that may result in a catastrophic outcome. The patient indicated having understood very clearly. We have given the patient no guarantees and we have made no promises. Enough time was given to the patient to ask questions, all of which were answered to the patient's satisfaction. Brittney Simpson has indicated that she wanted to continue with the procedure. Attestation: I, the ordering provider, attest that I have discussed with the patient the benefits, risks, side-effects, alternatives, likelihood of achieving goals, and potential problems during recovery for the procedure that I have provided informed consent. Date: 03/25/2017; Time: 9:31 AM  Pre-Procedure Preparation:  Monitoring: As per clinic protocol. Respiration, ETCO2, SpO2, BP, heart rate and rhythm monitor placed and checked for adequate function Safety Precautions: Patient was assessed for positional comfort and pressure points before starting the  procedure. Time-out: I initiated and conducted the "Time-out" before starting the procedure, as per protocol. The patient was asked to participate by confirming the accuracy of the "Time Out" information. Verification of the correct person, site, and procedure were performed and confirmed by me, the nursing staff, and the patient. "Time-out" conducted as per Joint Commission's Universal Protocol (UP.01.01.01). "Time-out" Date & Time: 03/25/2017; 1011 hrs.  Description of Procedure Process:   Position: Prone Target Area: For Lumbar Facet blocks, the target is the groove formed by the junction of the transverse process and superior articular process. For the L5 dorsal ramus, the target is the notch between superior articular process and sacral ala. For the S1 dorsal ramus, the target is the superior and lateral edge of the posterior S1 Sacral foramen. Approach: Paramedial approach. Area Prepped: Entire Posterior Lumbosacral Region Prepping solution: ChloraPrep (2% chlorhexidine gluconate and 70% isopropyl alcohol) Safety Precautions: Aspiration looking for blood return was conducted prior to all injections. At no point did we inject any substances, as a needle was being advanced. No attempts were made at seeking any paresthesias. Safe injection practices and needle disposal techniques used. Medications properly checked for expiration dates. SDV (single dose vial) medications used. Description of the Procedure: Protocol guidelines were followed. The patient was placed in position over the fluoroscopy table. The target area was identified and the area prepped in the usual manner. Skin desensitized using vapocoolant spray. Skin & deeper tissues infiltrated with local anesthetic. Appropriate amount of time allowed to pass for local anesthetics to take effect. The procedure needle was introduced through the skin, ipsilateral to the reported pain, and advanced to the target area. Employing the "Medial Branch  Technique", the needles were advanced to the angle made by the superior and medial portion of the transverse process, and the lateral and inferior portion of the superior articulating process of the targeted vertebral bodies. This area is known as "Burton's Eye" or the "Eye of the [place] Dog". A procedure needle was introduced through the skin, and this time advanced to  the angle made by the superior and medial border of the sacral ala, and the lateral border of the S1 vertebral body. This last needle was later repositioned at the superior and lateral border of the posterior S1 foramen. Negative aspiration confirmed. Solution injected in intermittent fashion, asking for systemic symptoms every 0.5cc of injectate. The needles were then removed and the area cleansed, making sure to leave some of the prepping solution back to take advantage of its long term bactericidal properties.   Illustration of the posterior view of the lumbar spine and the posterior neural structures. Laminae of L2 through S1 are labeled. DPRL5, dorsal primary ramus of L5; DPRS1, dorsal primary ramus of S1; DPR3, dorsal primary ramus of L3; FJ, facet (zygapophyseal) joint L3-L4; I, inferior articular process of L4; LB1, lateral branch of dorsal primary ramus of L1; IAB, inferior articular branches from L3 medial branch (supplies L4-L5 facet joint); IBP, intermediate branch plexus; MB3, medial branch of dorsal primary ramus of L3; NR3, third lumbar nerve root; S, superior articular process of L5; SAB, superior articular branches from L4 (supplies L4-5 facet joint also); TP3, transverse process of L3.  Vitals:   03/25/17 1012 03/25/17 1017 03/25/17 1022 03/25/17 1026  BP: (!) 184/74 (!) 184/76 (!) 182/80 (!) 185/83  Pulse:      Resp: 10 13 12 12   Temp:      TempSrc:      SpO2: 95% 95% 94% 94%  Weight:      Height:        Start Time: 1011 hrs. End Time: 1025 hrs. Materials:  Needle(s) Type: Regular needle Gauge: 22G Length:  3.5-in Medication(s): We administered lidocaine, triamcinolone acetonide, ropivacaine (PF) 2 mg/mL (0.2%), triamcinolone acetonide, and ropivacaine (PF) 2 mg/mL (0.2%). Please see chart orders for dosing details.  Imaging Guidance (Spinal):  Type of Imaging Technique: Fluoroscopy Guidance (Spinal) Indication(s): Assistance in needle guidance and placement for procedures requiring needle placement in or near specific anatomical locations not easily accessible without such assistance. Exposure Time: Please see nurses notes. Contrast: None used. Fluoroscopic Guidance: I was personally present during the use of fluoroscopy. "Tunnel Vision Technique" used to obtain the best possible view of the target area. Parallax error corrected before commencing the procedure. "Direction-depth-direction" technique used to introduce the needle under continuous pulsed fluoroscopy. Once target was reached, antero-posterior, oblique, and lateral fluoroscopic projection used confirm needle placement in all planes. Images permanently stored in EMR. Interpretation: No contrast injected. I personally interpreted the imaging intraoperatively. Adequate needle placement confirmed in multiple planes. Permanent images saved into the patient's record.  Antibiotic Prophylaxis:  Indication(s): None identified Antibiotic given: None  Post-operative Assessment:  EBL: None Complications: No immediate post-treatment complications observed by team, or reported by patient. Note: The patient tolerated the entire procedure well. A repeat set of vitals were taken after the procedure and the patient was kept under observation following institutional policy, for this type of procedure. Post-procedural neurological assessment was performed, showing return to baseline, prior to discharge. The patient was provided with post-procedure discharge instructions, including a section on how to identify potential problems. Should any problems arise  concerning this procedure, the patient was given instructions to immediately contact us, at any time, without hesitation. In any case, we plan to contact the patient by telephone for a follow-up status report regarding this interventional procedure. Comments:  No additional relevant information.  Plan of Care  Disposition: Discharge home  Discharge Date & Time: 03/25/2017; 1035 hrs.   Physician-requested Follow-up:  Return for post-procedure eval by Dr. Dossie Arbour in 2 weeks.  Future Appointments Date Time Provider [place]  04/28/2017 10:15 AM Milinda Pointer, MD [facility] None  05/02/2017 3:00 PM Olin Hauser, DO [place] None  05/21/2017 10:30 AM Vevelyn Francois, NP [facility] None  03/10/2018 8:30 AM [facility] None  03/10/2018 9:45 AM Dew, Erskine Squibb, MD [facility] None    Imaging Orders     DG C-Arm 1-60 Min-No Report  Procedure Orders     LUMBAR FACET(MEDIAL BRANCH NERVE BLOCK) MBNB  Medications ordered for procedure: Meds ordered this encounter  Medications  . DISCONTD: lactated ringers infusion 1,000 mL  . DISCONTD: midazolam (VERSED) 5 MG/5ML injection 1-2 mg    Make sure Flumazenil is available in the pyxis when using this medication. If oversedation occurs, administer 0.2 mg IV over 15 sec. If after 45 sec no response, administer 0.2 mg again over 1 min; may repeat at 1 min intervals; not to exceed 4 doses (1 mg)  . DISCONTD: fentaNYL (SUBLIMAZE) injection 25-50 mcg    Make sure Narcan is available in the pyxis when using this medication. In the event of respiratory depression (RR< 8/min): Titrate NARCAN (naloxone) in increments of 0.1 to 0.2 mg IV at 2-3 minute intervals, until desired degree of reversal.  . lidocaine (XYLOCAINE) 2 % (with pres) injection 200 mg  . triamcinolone acetonide (KENALOG-40) injection 40 mg  . ropivacaine (PF) 2 mg/mL (0.2%) (NAROPIN) injection 9 mL  . triamcinolone acetonide (KENALOG-40) injection 40 mg  .  ropivacaine (PF) 2 mg/mL (0.2%) (NAROPIN) injection 9 mL   Medications administered: We administered lidocaine, triamcinolone acetonide, ropivacaine (PF) 2 mg/mL (0.2%), triamcinolone acetonide, and ropivacaine (PF) 2 mg/mL (0.2%).  See the medical record for exact dosing, route, and time of administration.  New Prescriptions   No medications on file   Primary Care Physician: Olin Hauser, DO Location: [facility] Facility Note by: Gaspar Cola, MD Date: 03/25/2017; Time: 12:30 PM  Disclaimer:  Medicine is not an Chief Strategy Officer. The only guarantee in medicine is that nothing is guaranteed. It is important to note that the decision to proceed with this intervention was based on the information collected from the patient. The Data and conclusions were drawn from the patient's questionnaire, the interview, and the physical examination. Because the information was provided in large part by the patient, it cannot be guaranteed that it has not been purposely or unconsciously manipulated. Every effort has been made to obtain as much relevant data as possible for this evaluation. It is important to note that the conclusions that lead to this procedure are derived in large part from the available data. Always take into account that the treatment will also be dependent on availability of resources and existing treatment guidelines, considered by other Pain Management Practitioners as being common knowledge and practice, at the time of the intervention. For Medico-Legal purposes, it is also important to point out that variation in procedural techniques and pharmacological choices are the acceptable norm. The indications, contraindications, technique, and results of the above procedure should only be interpreted and judged by a Board-Certified Interventional Pain Specialist with extensive familiarity and expertise in the same exact procedure and technique.

## 2017-03-25 NOTE — Patient Instructions (Addendum)
____________________________________________________________________________________________  Post-Procedure instructions Instructions:  Apply ice: Fill a plastic sandwich bag with crushed ice. Cover it with a small towel and apply to injection site. Apply for 15 minutes then remove x 15 minutes. Repeat sequence on day of procedure, until you go to bed. The purpose is to minimize swelling and discomfort after procedure.  Apply heat: Apply heat to procedure site starting the day following the procedure. The purpose is to treat any soreness and discomfort from the procedure.  Food intake: Start with clear liquids (like water) and advance to regular food, as tolerated.   Physical activities: Keep activities to a minimum for the first 8 hours after the procedure.   Driving: If you have received any sedation, you are not allowed to drive for 24 hours after your procedure.  Blood thinner: Restart your blood thinner 6 hours after your procedure. (Only for those taking blood thinners)  Insulin: As soon as you can eat, you may resume your normal dosing schedule. (Only for those taking insulin)  Infection prevention: Keep procedure site clean and dry.  Post-procedure Pain Diary: Extremely important that this be done correctly and accurately. Recorded information will be used to determine the next step in treatment.  Pain evaluated is that of treated area only. Do not include pain from an untreated area.  Complete every hour, on the hour, for the initial 8 hours. Set an alarm to help you do this part accurately.  Do not go to sleep and have it completed later. It will not be accurate.  Follow-up appointment: Keep your follow-up appointment after the procedure. Usually 2 weeks for most procedures. (6 weeks in the case of radiofrequency.) Bring you pain diary.  Expect:  From numbing medicine (AKA: Local Anesthetics): Numbness or decrease in pain.  Onset: Full effect within 15 minutes of  injected.  Duration: It will depend on the type of local anesthetic used. On the average, 1 to 8 hours.   From steroids: Decrease in swelling or inflammation. Once inflammation is improved, relief of the pain will follow.  Onset of benefits: Depends on the amount of swelling present. The more swelling, the longer it will take for the benefits to be seen. In some cases, up to 10 days.  Duration: Steroids will stay in the system x 2 weeks. Duration of benefits will depend on multiple posibilities including persistent irritating factors.  From procedure: Some discomfort is to be expected once the numbing medicine wears off. This should be minimal if ice and heat are applied as instructed. Call if:  You experience numbness and weakness that gets worse with time, as opposed to wearing off.  New onset bowel or bladder incontinence. (Spinal procedures only)  Emergency Numbers:  [place] business hours (Monday - Thursday, 8:00 AM - 4:00 PM) (Friday, 9:00 AM - 12:00 Noon): [phone]  After hours: [phone] ____________________________________________________________________________________________   Facet Joint Block The facet joints connect the bones of the spine (vertebrae). They make it possible for you to bend, twist, and make other movements with your spine. They also keep you from bending too far, twisting too far, and making other excessive movements. A facet joint block is a procedure where a numbing medicine (anesthetic) is injected into a facet joint. Often, a type of anti-inflammatory medicine called a steroid is also injected. A facet joint block may be done to diagnose neck or back pain. If the pain gets better after a facet joint block, it means the pain is probably coming from  the facet joint. If the pain does not get better, it means the pain is probably not coming from the facet joint. A facet joint block may also be done to relieve neck or back pain caused by an inflamed  facet joint. A facet joint block is only done to relieve pain if the pain does not improve with other methods, such as medicine, exercise programs, and physical therapy. Tell a health care provider about:  Any allergies you have.  All medicines you are taking, including vitamins, herbs, eye drops, creams, and over-the-counter medicines.  Any problems you or family members have had with anesthetic medicines.  Any blood disorders you have.  Any surgeries you have had.  Any medical conditions you have.  Whether you are pregnant or may be pregnant. What are the risks? Generally, this is a safe procedure. However, problems may occur, including:  Bleeding.  Injury to a nerve near the injection site.  Pain at the injection site.  Weakness or numbness in areas controlled by nerves near the injection site.  Infection.  Temporary fluid retention.  Allergic reactions to medicines or dyes.  Injury to other structures or organs near the injection site.  What happens before the procedure?  Follow instructions from your health care provider about eating or drinking restrictions.  Ask your health care provider about: ? Changing or stopping your regular medicines. This is especially important if you are taking diabetes medicines or blood thinners. ? Taking medicines such as aspirin and ibuprofen. These medicines can thin your blood. Do not take these medicines before your procedure if your health care provider instructs you not to.  Do not take any new dietary supplements or medicines without asking your health care provider first.  Plan to have someone take you home after the procedure. What happens during the procedure?  You may need to remove your clothing and dress in an open-back gown.  The procedure will be done while you are lying on an X-ray table. You will most likely be asked to lie on your stomach, but you may be asked to lie in a different position if an injection will be  made in your neck.  Machines will be used to monitor your oxygen levels, heart rate, and blood pressure.  If an injection will be made in your neck, an IV tube will be inserted into one of your veins. Fluids and medicine will flow directly into your body through the IV tube.  The area over the facet joint where the injection will be made will be cleaned with soap. The surrounding skin will be covered with clean drapes.  A numbing medicine (local anesthetic) will be applied to your skin. Your skin may sting or burn for a moment.  A video X-ray machine (fluoroscopy) will be used to locate the joint. In some cases, a CT scan may be used.  A contrast dye may be injected into the facet joint area to help locate the joint.  When the joint is located, an anesthetic will be injected into the joint through the needle.  Your health care provider will ask you whether you feel pain relief. If you do feel relief, a steroid may be injected to provide pain relief for a longer period of time. If you do not feel relief or feel only partial relief, additional injections of an anesthetic may be made in other facet joints.  The needle will be removed.  Your skin will be cleaned.  A bandage (dressing) will  be applied over each injection site. The procedure may vary among health care providers and hospitals. What happens after the procedure?  You will be observed for 15-30 minutes before being allowed to go home. This information is not intended to replace advice given to you by your health care provider. Make sure you discuss any questions you have with your health care provider. Document Released: 12/04/2006 Document Revised: 08/16/2015 Document Reviewed: 04/10/2015 Elsevier Interactive Patient Education  2018 [employer].  Facet Joint Block The facet joints connect the bones of the spine (vertebrae). They make it possible for you to bend, twist, and make other movements with your spine. They also keep  you from bending too far, twisting too far, and making other excessive movements. A facet joint block is a procedure where a numbing medicine (anesthetic) is injected into a facet joint. Often, a type of anti-inflammatory medicine called a steroid is also injected. A facet joint block may be done to diagnose neck or back pain. If the pain gets better after a facet joint block, it means the pain is probably coming from the facet joint. If the pain does not get better, it means the pain is probably not coming from the facet joint. A facet joint block may also be done to relieve neck or back pain caused by an inflamed facet joint. A facet joint block is only done to relieve pain if the pain does not improve with other methods, such as medicine, exercise programs, and physical therapy. Tell a health care provider about:  Any allergies you have.  All medicines you are taking, including vitamins, herbs, eye drops, creams, and over-the-counter medicines.  Any problems you or family members have had with anesthetic medicines.  Any blood disorders you have.  Any surgeries you have had.  Any medical conditions you have.  Whether you are pregnant or may be pregnant. What are the risks? Generally, this is a safe procedure. However, problems may occur, including:  Bleeding.  Injury to a nerve near the injection site.  Pain at the injection site.  Weakness or numbness in areas controlled by nerves near the injection site.  Infection.  Temporary fluid retention.  Allergic reactions to medicines or dyes.  Injury to other structures or organs near the injection site.  What happens before the procedure?  Follow instructions from your health care provider about eating or drinking restrictions.  Ask your health care provider about: ? Changing or stopping your regular medicines. This is especially important if you are taking diabetes medicines or blood thinners. ? Taking medicines such as  aspirin and ibuprofen. These medicines can thin your blood. Do not take these medicines before your procedure if your health care provider instructs you not to.  Do not take any new dietary supplements or medicines without asking your health care provider first.  Plan to have someone take you home after the procedure. What happens during the procedure?  You may need to remove your clothing and dress in an open-back gown.  The procedure will be done while you are lying on an X-ray table. You will most likely be asked to lie on your stomach, but you may be asked to lie in a different position if an injection will be made in your neck.  Machines will be used to monitor your oxygen levels, heart rate, and blood pressure.  If an injection will be made in your neck, an IV tube will be inserted into one of your veins. Fluids and  medicine will flow directly into your body through the IV tube.  The area over the facet joint where the injection will be made will be cleaned with soap. The surrounding skin will be covered with clean drapes.  A numbing medicine (local anesthetic) will be applied to your skin. Your skin may sting or burn for a moment.  A video X-ray machine (fluoroscopy) will be used to locate the joint. In some cases, a CT scan may be used.  A contrast dye may be injected into the facet joint area to help locate the joint.  When the joint is located, an anesthetic will be injected into the joint through the needle.  Your health care provider will ask you whether you feel pain relief. If you do feel relief, a steroid may be injected to provide pain relief for a longer period of time. If you do not feel relief or feel only partial relief, additional injections of an anesthetic may be made in other facet joints.  The needle will be removed.  Your skin will be cleaned.  A bandage (dressing) will be applied over each injection site. The procedure may vary among health care providers  and hospitals. What happens after the procedure?  You will be observed for 15-30 minutes before being allowed to go home. This information is not intended to replace advice given to you by your health care provider. Make sure you discuss any questions you have with your health care provider. Document Released: 12/04/2006 Document Revised: 08/16/2015 Document Reviewed: 04/10/2015 Elsevier Interactive Patient Education  [employer].

## 2017-03-26 ENCOUNTER — Telehealth: Payer: Self-pay | Admitting: *Deleted

## 2017-03-26 NOTE — Telephone Encounter (Signed)
Attempted to call patient for post procedure follow-up. Message left on Carol's voicemail.

## 2017-04-04 DIAGNOSIS — H401133 Primary open-angle glaucoma, bilateral, severe stage: Secondary | ICD-10-CM | POA: Diagnosis not present

## 2017-04-11 DIAGNOSIS — H401133 Primary open-angle glaucoma, bilateral, severe stage: Secondary | ICD-10-CM | POA: Diagnosis not present

## 2017-04-14 ENCOUNTER — Other Ambulatory Visit: Payer: Self-pay

## 2017-04-14 ENCOUNTER — Other Ambulatory Visit: Payer: Self-pay | Admitting: Family Medicine

## 2017-04-14 DIAGNOSIS — F419 Anxiety disorder, unspecified: Secondary | ICD-10-CM

## 2017-04-14 MED ORDER — CLONAZEPAM 0.5 MG PO TABS: ORAL_TABLET | ORAL | 5 refills | Status: DC

## 2017-04-14 NOTE — Telephone Encounter (Signed)
Brittney Simpson Spoke with pt's daughter [name], she would like to have a refill for her mother's prescription for clonazePAM (KLONOPIN) 0.5 MG tablet [[id number]]  Sent to the pharmacy. Please call her to confirm availability. Thank you, knb

## 2017-04-27 NOTE — Progress Notes (Signed)
Patient's Name: Brittney Simpson  MRN: [id number]  Referring Provider: Nobie Putnam *  DOB: 18-Feb-1928  PCP: Olin Hauser, DO  DOS: 04/28/2017  Note by: Gaspar Cola, MD  Service setting: Ambulatory outpatient  Specialty: Interventional Pain Management  Location: [facility] (AMB) Pain Management Facility    Patient type: Established   Primary Reason(s) for Visit: Encounter for post-procedure evaluation of chronic illness with mild to moderate exacerbation CC: Back Pain (lower)  HPI  Brittney Simpson is a 83 y.o. year old, female patient, who comes today for a post-procedure evaluation. She has Chronic pain; Long term current use of opiate analgesic; Long term prescription opiate use; Opiate use (15 MME/Day); Encounter for therapeutic drug level monitoring; Lumbar facet syndrome (Bilateral) (R>L); Chronic low back pain (Bilateral) (R>L); Spondylolisthesis of lumbosacral region (L2-3 and L5-S1); Lumbar spinal stenosis (9 mm at L3-4); Trochanteric bursitis (Right); Chronic hip pain (Right); Osteoarthritis of hip (Right); Chronic sacroiliac joint pain (Right); Chronic lumbar radicular pain (Right); Chronic lower extremity pain (Right); Myofascial pain; Encounter for chronic pain management; Essential hypertension; CKD (chronic kidney disease) stage 3, GFR 30-59 ml/min ([place]); Skin lesion of scalp; Chronic anxiety; Insomnia; Hypothyroidism; Lumbar spondylosis; Recurrent UTI; GERD (gastroesophageal reflux disease); Presbycusis of both ears; Hyperlipidemia; Abnormal glucose; Bilateral hearing loss; Osteoporosis; and AAA (abdominal aortic aneurysm) without rupture (HCC) on her problem list. Her primarily concern today is the Back Pain (lower)  Pain Assessment: Location: Lower Back Radiating: denies Onset: More than a month ago Duration: Chronic pain Quality: Aching, Constant Severity: 3 /10 (self-reported pain score)  Note: Reported level is compatible with observation. Clinically the  patient looks like a 1/10       Pain level appears to be "Mild", defined here as nagging, annoying, but does not interfere with basic activities of daily living (ADL). Brittney Simpson is able to eat, bathe, get dressed, do toileting (being able to get on and off the toilet and perform personal hygiene functions), transfer (move in and out of bed or a chair without assistance), and maintain continence (able to control bladder and bowel functions). Physiologic parameters such as blood pressure and heart rate apear wnl. Timing: Constant Modifying factors: procedures, heat  Brittney Simpson comes in today for post-procedure evaluation after the treatment done on 03/25/2017.  Further details on both, my assessment(s), as well as the proposed treatment plan, please see below.  Post-Procedure Assessment  03/25/2017 Procedure: Diagnostic bilateral lumbar facet block  Pre-procedure pain score:  8/10 Post-procedure pain score: 8/10 No initial benefit, possible due to rapid discharge after no sedation procedure, without enough time to allow full onset of block. Influential Factors: BMI: 20.16 kg/m Intra-procedural challenges: None observed.         Assessment challenges: None detected.              Reported side-effects: None.        Post-procedural adverse reactions or complications: None reported         Sedation: No sedation used. When no sedatives are used, the analgesic levels obtained are directly associated to the effectiveness of the local anesthetics. However, when sedation is provided, the level of analgesia obtained during the initial 1 hour following the intervention, is believed to be the result of a combination of factors. These factors may include, but are not limited to: 1. The effectiveness of the local anesthetics used. 2. The effects of the analgesic(s) and/or anxiolytic(s) used. 3. The degree of discomfort experienced by the patient at the time of  the procedure. 4. The patients ability and  reliability in recalling and recording the events. 5. The presence and influence of possible secondary gains and/or psychosocial factors. Reported result: Relief experienced during the 1st hour after the procedure: 0 % (Ultra-Short Term Relief)            Interpretative annotation: Clinically appropriate result. Analgesia during this period is likely to be Local Anesthetic and/or IV Sedative (Analgesic/Anxiolytic) related.          Effects of local anesthetic: The analgesic effects attained during this period are directly associated to the localized infiltration of local anesthetics and therefore cary significant diagnostic value as to the etiological location, or anatomical origin, of the pain. Expected duration of relief is directly dependent on the pharmacodynamics of the local anesthetic used. Long-acting (4-6 hours) anesthetics used.  Reported result: Relief during the next 4 to 6 hour after the procedure: 90 % (Short-Term Relief)            Interpretative annotation: Clinically appropriate result. Analgesia during this period is likely to be Local Anesthetic-related.          Long-term benefit: Defined as the period of time past the expected duration of local anesthetics (1 hour for short-acting and 4-6 hours for long-acting). With the possible exception of prolonged sympathetic blockade from the local anesthetics, benefits during this period are typically attributed to, or associated with, other factors such as analgesic sensory neuropraxia, antiinflammatory effects, or beneficial biochemical changes provided by agents other than the local anesthetics.  Reported result: Extended relief following procedure: 100 % (until 20th day then 50%) (Long-Term Relief)            Interpretative annotation: Clinically appropriate result. Good relief. No permanent benefit expected. Inflammation plays a part in the etiology to the pain.          Current benefits: Defined as reported results that persistent at  this point in time.   Analgesia: 50 %            Function: Brittney Simpson reports improvement in function ROM: Brittney Simpson reports improvement in ROM Interpretative annotation: Recurrence of symptoms. No permanent benefit expected. Effective diagnostic intervention.          Interpretation: Results would suggest a successful diagnostic intervention.                  Plan:  Please see "Plan of Care" for details.       Laboratory Chemistry  Inflammation Markers (CRP: Acute Phase) (ESR: Chronic Phase) No results found for: CRP, ESRSEDRATE               Renal Function Markers Lab Results  Component Value Date   BUN 14 11/22/2016   CREATININE 0.86 11/22/2016   GFRAA 71 11/22/2016   GFRNONAA 61 11/22/2016                 Hepatic Function Markers Lab Results  Component Value Date   AST 11 11/22/2016   ALT 9 11/22/2016   ALBUMIN 3.6 11/22/2016   ALKPHOS 59 11/22/2016                 Electrolytes Lab Results  Component Value Date   NA 136 11/22/2016   K 4.2 11/22/2016   CL 101 11/22/2016   CALCIUM 8.9 11/22/2016   MG 2.1 01/14/2014                 Neuropathy Markers No results found for: [id number]  Bone Pathology Markers Lab Results  Component Value Date   ALKPHOS 59 11/22/2016   CALCIUM 8.9 11/22/2016                 Coagulation Parameters Lab Results  Component Value Date   PLT 171 11/22/2016                 Cardiovascular Markers Lab Results  Component Value Date   HGB 11.9 11/22/2016   HCT 38.8 11/22/2016                 Note: Lab results reviewed.  Recent Diagnostic Imaging Results  DG C-Arm 1-60 Min-No Report Fluoroscopy was utilized by the requesting physician.  No radiographic  interpretation.   Note: Imaging results reviewed.        Meds   Current Outpatient Prescriptions:  .  acetaminophen (TYLENOL) 500 MG tablet, Take 1,000 mg by mouth daily as needed for moderate pain or headache., Disp: , Rfl:  .  amLODipine (NORVASC) 5  MG tablet, Take 1 tablet (5 mg total) by mouth daily. (Patient taking differently: Take 5 mg by mouth daily with lunch. ), Disp: 30 tablet, Rfl: 12 .  ASPERCREME LIDOCAINE EX, Apply 1 application topically., Disp: , Rfl:  .  brimonidine (ALPHAGAN) 0.2 % ophthalmic solution, Place 1 drop into the left eye 2 (two) times daily. , Disp: , Rfl:  .  clonazePAM (KLONOPIN) 0.5 MG tablet, TAKE 1/2 TABLET BY MOUTH TWICE DAILY FOR ANXIETY., Disp: 30 tablet, Rfl: 5 .  Ergocalciferol (VITAMIN D2) 2000 UNITS TABS, Take 2,000 Units by mouth daily with lunch. , Disp: , Rfl:  .  furosemide (LASIX) 20 MG tablet, Take 1 tablet (20 mg total) by mouth daily., Disp: 30 tablet, Rfl: 12 .  latanoprost (XALATAN) 0.005 % ophthalmic solution, Place 1 drop into the left eye at bedtime. , Disp: , Rfl:  .  levothyroxine (SYNTHROID, LEVOTHROID) 125 MCG tablet, TAKE 1 TABLET BY MOUTH ONCE DAILY ON AN EMPTY STOMACH. WAIT 30 MINUTES BEFORE TAKING OTHER MEDS., Disp: 30 tablet, Rfl: 12 .  lisinopril (PRINIVIL,ZESTRIL) 40 MG tablet, Take 1 tablet (40 mg total) by mouth daily., Disp: 30 tablet, Rfl: 12 .  metoprolol succinate (TOPROL-XL) 100 MG 24 hr tablet, Take 1 tablet (100 mg total) by mouth daily. Take with or immediately following a meal., Disp: 30 tablet, Rfl: 11 .  omeprazole (PRILOSEC) 20 MG capsule, Take 1 capsule (20 mg total) by mouth daily. (Patient taking differently: Take 20 mg by mouth daily with lunch. ), Disp: 30 capsule, Rfl: 11 .  sulfamethoxazole-trimethoprim (BACTRIM) 400-80 MG tablet, Take 1 tablet by mouth daily with supper. , Disp: , Rfl:  .  timolol (TIMOPTIC) 0.5 % ophthalmic solution, Place 1 drop into the left eye 2 (two) times daily. , Disp: , Rfl:  .  traMADol (ULTRAM) 50 MG tablet, Take 1 tablet (50 mg total) by mouth 3 (three) times daily as needed for severe pain., Disp: 10 tablet, Rfl: 0 .  traZODone (DESYREL) 50 MG tablet, Take 1 tablet (50 mg total) by mouth at bedtime., Disp: 30 tablet, Rfl: 11  ROS   Constitutional: Denies any fever or chills Gastrointestinal: No reported hemesis, hematochezia, vomiting, or acute GI distress Musculoskeletal: Denies any acute onset joint swelling, redness, loss of ROM, or weakness Neurological: No reported episodes of acute onset apraxia, aphasia, dysarthria, agnosia, amnesia, paralysis, loss of coordination, or loss of consciousness  Allergies  Brittney Simpson has No Known Allergies.  Brittney Simpson  Drug: Brittney Simpson  reports that she does not use drugs. Alcohol:  reports that she does not drink alcohol. Tobacco:  reports that she has never smoked. She has never used smokeless tobacco. Medical:  has a past medical history of Anxiety; Back ache; Hiatal hernia; Hyperlipidemia; Hypertension; Hypothyroid; Osteopenia; Thyroid disease; and UTI (urinary tract infection). Surgical: Brittney Simpson  has a past surgical history that includes Hernia repair; Hernia repair; Hemorrhoid surgery; and Kyphoplasty (N/A, 01/23/2017). Family: family history includes Cancer in her father; Stroke in her mother.  Constitutional Exam  General appearance: Well nourished, well developed, and well hydrated. In no apparent acute distress Vitals:   04/28/17 1047  BP: (!) 190/76  Pulse: (!) 58  Resp: 18  Temp: 98.2 F (36.8 C)  TempSrc: Oral  SpO2: 100%  Weight: 124 lb 14.4 oz (56.7 kg)  Height: '5\' 6"'  (1.676 m)   BMI Assessment: Estimated body mass index is 20.16 kg/m as calculated from the following:   Height as of this encounter: '5\' 6"'  (1.676 m).   Weight as of this encounter: 124 lb 14.4 oz (56.7 kg).  BMI interpretation table: BMI level Category Range association with higher incidence of chronic pain  <18 kg/m2 Underweight   18.5-24.9 kg/m2 Ideal body weight   25-29.9 kg/m2 Overweight Increased incidence by 20%  30-34.9 kg/m2 Obese (Class I) Increased incidence by 68%  35-39.9 kg/m2 Severe obesity (Class II) Increased incidence by 136%  >40 kg/m2 Extreme obesity (Class III)  Increased incidence by 254%   BMI Readings from Last 4 Encounters:  04/28/17 20.16 kg/m  03/25/17 20.66 kg/m  03/04/17 20.66 kg/m  02/11/17 20.66 kg/m   Wt Readings from Last 4 Encounters:  04/28/17 124 lb 14.4 oz (56.7 kg)  03/25/17 128 lb (58.1 kg)  03/04/17 128 lb (58.1 kg)  02/11/17 128 lb (58.1 kg)  Psych/Mental status: Alert, oriented x 3 (person, place, & time)       Eyes: PERLA Respiratory: No evidence of acute respiratory distress  Cervical Spine Area Exam  Skin & Axial Inspection: No masses, redness, edema, swelling, or associated skin lesions Alignment: Symmetrical Functional ROM: Unrestricted ROM      Stability: No instability detected Muscle Tone/Strength: Functionally intact. No obvious neuro-muscular anomalies detected. Sensory (Neurological): Unimpaired Palpation: No palpable anomalies              Upper Extremity (UE) Exam    Side: Right upper extremity  Side: Left upper extremity  Skin & Extremity Inspection: Skin color, temperature, and hair growth are WNL. No peripheral edema or cyanosis. No masses, redness, swelling, asymmetry, or associated skin lesions. No contractures.  Skin & Extremity Inspection: Skin color, temperature, and hair growth are WNL. No peripheral edema or cyanosis. No masses, redness, swelling, asymmetry, or associated skin lesions. No contractures.  Functional ROM: Unrestricted ROM          Functional ROM: Unrestricted ROM          Muscle Tone/Strength: Functionally intact. No obvious neuro-muscular anomalies detected.  Muscle Tone/Strength: Functionally intact. No obvious neuro-muscular anomalies detected.  Sensory (Neurological): Unimpaired          Sensory (Neurological): Unimpaired          Palpation: No palpable anomalies              Palpation: No palpable anomalies              Specialized Test(s): Deferred         Specialized Test(s): Deferred  Thoracic Spine Area Exam  Skin & Axial Inspection: No masses, redness, or  swelling Alignment: Symmetrical Functional ROM: Unrestricted ROM Stability: No instability detected Muscle Tone/Strength: Functionally intact. No obvious neuro-muscular anomalies detected. Sensory (Neurological): Unimpaired Muscle strength & Tone: No palpable anomalies  Lumbar Spine Area Exam  Skin & Axial Inspection: No masses, redness, or swelling Alignment: Symmetrical Functional ROM: Unrestricted ROM      Stability: No instability detected Muscle Tone/Strength: Functionally intact. No obvious neuro-muscular anomalies detected. Sensory (Neurological): Unimpaired Palpation: No palpable anomalies       Provocative Tests: Lumbar Hyperextension and rotation test: evaluation deferred today       Lumbar Lateral bending test: evaluation deferred today       Patrick's Maneuver: evaluation deferred today                    Gait & Posture Assessment  Ambulation: Unassisted Gait: Relatively normal for age and body habitus Posture: WNL   Lower Extremity Exam    Side: Right lower extremity  Side: Left lower extremity  Skin & Extremity Inspection: Skin color, temperature, and hair growth are WNL. No peripheral edema or cyanosis. No masses, redness, swelling, asymmetry, or associated skin lesions. No contractures.  Skin & Extremity Inspection: Skin color, temperature, and hair growth are WNL. No peripheral edema or cyanosis. No masses, redness, swelling, asymmetry, or associated skin lesions. No contractures.  Functional ROM: Unrestricted ROM          Functional ROM: Unrestricted ROM          Muscle Tone/Strength: Functionally intact. No obvious neuro-muscular anomalies detected.  Muscle Tone/Strength: Functionally intact. No obvious neuro-muscular anomalies detected.  Sensory (Neurological): Unimpaired  Sensory (Neurological): Unimpaired  Palpation: No palpable anomalies  Palpation: No palpable anomalies   Assessment  Primary Diagnosis & Pertinent Problem List: The primary encounter  diagnosis was Lumbar facet syndrome (Bilateral) (R>L). Diagnoses of Lumbar spondylosis and Chronic low back pain (Bilateral) (R>L) were also pertinent to this visit.  Status Diagnosis  Controlled Controlled Controlled 1. Lumbar facet syndrome (Bilateral) (R>L)   2. Lumbar spondylosis   3. Chronic low back pain (Bilateral) (R>L)     Problems updated and reviewed during this visit: No problems updated. Plan of Care  Pharmacotherapy (Medications Ordered): No orders of the defined types were placed in this encounter.  New Prescriptions   No medications on file   Medications administered today: Brittney Simpson had no medications administered during this visit.  Procedure Orders    No procedure(s) ordered today   Lab Orders  No laboratory test(s) ordered today   Imaging Orders  No imaging studies ordered today   Referral Orders  No referral(s) requested today    Interventional management options: Planned, scheduled, and/or pending:   None at this point.   Considering:   Palliative bilateral lumbar facet block  Possible bilateral lumbar facet radiofrequency ablation.  Diagnostic right-sided intra-articular hip joint injection  Palliative right-sided L3-4 lumbar epidural steroid injection  Diagnostic right-sided transforaminal L3-4 epidural steroid injection  Diagnostic right-sided trochanteric bursa injection  Diagnostic right-sided sacroiliac joint block    Palliative PRN treatment(s):   Palliative bilateral lumbar facet block  Diagnostic right-sided intra-articular hip joint injection  Palliative right-sided L3-4 lumbar epidural steroid injection  Diagnostic right-sided transforaminal L3-4 epidural steroid injection  Diagnostic right-sided trochanteric bursa injection  Diagnostic right-sided sacroiliac joint block    Provider-requested follow-up: No Follow-up on file.  Future Appointments Date Time Provider [place]  05/02/2017 3:00  PM Olin Hauser, DO [place] None  05/21/2017 10:30 AM Vevelyn Francois, NP [facility] None  03/10/2018 8:30 AM [facility] None  03/10/2018 9:45 AM Dew, Erskine Squibb, MD [facility] None   Primary Care Physician: Olin Hauser, DO Location: [facility] Note by: Gaspar Cola, MD Date: 04/28/2017; Time: 2:04 PM

## 2017-04-28 ENCOUNTER — Encounter: Payer: Self-pay | Admitting: Pain Medicine

## 2017-04-28 ENCOUNTER — Ambulatory Visit: Payer: Medicare Other | Attending: Pain Medicine | Admitting: Pain Medicine

## 2017-04-28 VITALS — BP 190/76 | HR 58 | Temp 98.2°F | Resp 18 | Ht 66.0 in | Wt 124.9 lb

## 2017-04-28 DIAGNOSIS — H9193 Unspecified hearing loss, bilateral: Secondary | ICD-10-CM | POA: Diagnosis not present

## 2017-04-28 DIAGNOSIS — I129 Hypertensive chronic kidney disease with stage 1 through stage 4 chronic kidney disease, or unspecified chronic kidney disease: Secondary | ICD-10-CM | POA: Diagnosis not present

## 2017-04-28 DIAGNOSIS — M810 Age-related osteoporosis without current pathological fracture: Secondary | ICD-10-CM | POA: Diagnosis not present

## 2017-04-28 DIAGNOSIS — G4700 Insomnia, unspecified: Secondary | ICD-10-CM | POA: Diagnosis not present

## 2017-04-28 DIAGNOSIS — M1610 Unilateral primary osteoarthritis, unspecified hip: Secondary | ICD-10-CM | POA: Diagnosis not present

## 2017-04-28 DIAGNOSIS — M47816 Spondylosis without myelopathy or radiculopathy, lumbar region: Secondary | ICD-10-CM | POA: Diagnosis not present

## 2017-04-28 DIAGNOSIS — R7309 Other abnormal glucose: Secondary | ICD-10-CM | POA: Diagnosis not present

## 2017-04-28 DIAGNOSIS — M48061 Spinal stenosis, lumbar region without neurogenic claudication: Secondary | ICD-10-CM | POA: Insufficient documentation

## 2017-04-28 DIAGNOSIS — G8929 Other chronic pain: Secondary | ICD-10-CM | POA: Diagnosis not present

## 2017-04-28 DIAGNOSIS — Z87440 Personal history of urinary (tract) infections: Secondary | ICD-10-CM | POA: Insufficient documentation

## 2017-04-28 DIAGNOSIS — M545 Low back pain, unspecified: Secondary | ICD-10-CM

## 2017-04-28 DIAGNOSIS — N183 Chronic kidney disease, stage 3 (moderate): Secondary | ICD-10-CM | POA: Diagnosis not present

## 2017-04-28 DIAGNOSIS — M7061 Trochanteric bursitis, right hip: Secondary | ICD-10-CM | POA: Insufficient documentation

## 2017-04-28 DIAGNOSIS — N390 Urinary tract infection, site not specified: Secondary | ICD-10-CM | POA: Insufficient documentation

## 2017-04-28 DIAGNOSIS — E039 Hypothyroidism, unspecified: Secondary | ICD-10-CM | POA: Insufficient documentation

## 2017-04-28 DIAGNOSIS — Z79891 Long term (current) use of opiate analgesic: Secondary | ICD-10-CM | POA: Diagnosis not present

## 2017-04-28 DIAGNOSIS — K219 Gastro-esophageal reflux disease without esophagitis: Secondary | ICD-10-CM | POA: Insufficient documentation

## 2017-04-28 DIAGNOSIS — E785 Hyperlipidemia, unspecified: Secondary | ICD-10-CM | POA: Insufficient documentation

## 2017-04-28 DIAGNOSIS — I714 Abdominal aortic aneurysm, without rupture: Secondary | ICD-10-CM | POA: Insufficient documentation

## 2017-04-28 DIAGNOSIS — H9113 Presbycusis, bilateral: Secondary | ICD-10-CM | POA: Insufficient documentation

## 2017-04-28 DIAGNOSIS — M7918 Myalgia, other site: Secondary | ICD-10-CM | POA: Diagnosis not present

## 2017-04-28 NOTE — Progress Notes (Signed)
Safety precautions to be maintained throughout the outpatient stay will include: orient to surroundings, keep bed in low position, maintain call bell within reach at all times, provide assistance with transfer out of bed and ambulation.  

## 2017-05-02 ENCOUNTER — Encounter: Payer: Self-pay | Admitting: Family Medicine

## 2017-05-02 ENCOUNTER — Ambulatory Visit (INDEPENDENT_AMBULATORY_CARE_PROVIDER_SITE_OTHER): Payer: Medicare Other | Admitting: Family Medicine

## 2017-05-02 VITALS — BP 150/60 | HR 55 | Temp 98.2°F | Resp 16 | Ht 66.0 in | Wt 128.0 lb

## 2017-05-02 DIAGNOSIS — Z23 Encounter for immunization: Secondary | ICD-10-CM

## 2017-05-02 DIAGNOSIS — N183 Chronic kidney disease, stage 3 unspecified: Secondary | ICD-10-CM

## 2017-05-02 DIAGNOSIS — M25472 Effusion, left ankle: Secondary | ICD-10-CM | POA: Diagnosis not present

## 2017-05-02 DIAGNOSIS — M25471 Effusion, right ankle: Secondary | ICD-10-CM | POA: Insufficient documentation

## 2017-05-02 DIAGNOSIS — M47816 Spondylosis without myelopathy or radiculopathy, lumbar region: Secondary | ICD-10-CM

## 2017-05-02 DIAGNOSIS — N390 Urinary tract infection, site not specified: Secondary | ICD-10-CM | POA: Diagnosis not present

## 2017-05-02 DIAGNOSIS — I10 Essential (primary) hypertension: Secondary | ICD-10-CM | POA: Diagnosis not present

## 2017-05-02 DIAGNOSIS — F419 Anxiety disorder, unspecified: Secondary | ICD-10-CM

## 2017-05-02 DIAGNOSIS — I714 Abdominal aortic aneurysm, without rupture, unspecified: Secondary | ICD-10-CM

## 2017-05-02 MED ORDER — CIPROFLOXACIN HCL 500 MG PO TABS: 500.0000 mg | ORAL_TABLET | Freq: Two times a day (BID) | ORAL | 2 refills | Status: DC

## 2017-05-02 NOTE — Assessment & Plan Note (Signed)
Stable, chronic anxiety, with associated insomnia Chronically on BDZ without history of abuse or dose increase Checked [place] CSRS recently with rx  Plan: 1. Reviewed chronic BDZ use for anxiety, high risk sedation, fall, confusion, withdrawal risks - she is aware of most of these, and med tolerated very well, aware not to increase dose - Continue Clonazepam 0.5mg  HALF tab BID +5 refills given in 03/2017

## 2017-05-02 NOTE — Assessment & Plan Note (Addendum)
Today elevated BP previously well controlled, also elevated at ortho/gen surgery office, despite now pain controlled, mostly SBP high DBP low Complicated by CKD-III  Plan: 1. Continue current regimen - Amlodipine 5mg  daily, Lisinopril 40mg  daily, Metoprolol XL 100mg  daily, Lasix 20mg  daily - often some meds rx by Nephrology - Discussion on med changes, overall agree to no change. Potential change if still elevated SBP >170 consistently can double amlodipine from 5mg  to 10mg  daily, caution with considering HCTZ with history of hyponatremia is a concern and some minor edema 2. Encourage to stay active, low sodium diet, improve hydration with water 3. Follow-up q 6-7 months

## 2017-05-02 NOTE — Assessment & Plan Note (Addendum)
Stable without UTI recently Followed by Nephrology. Urine checked 02/2017 On bactrim prophylaxis daily Previously successful at early control of UTI if takes Cipro early with symptoms, Cipro 500mg  BID x 5 days, has used x 3 courses in 6 months - Refill Cipro today +2 refills, sent to pharmacy, discussed proper use

## 2017-05-02 NOTE — Progress Notes (Signed)
Subjective:    Patient ID: Brittney Simpson, female    DOB: 06-20-1928, 83 y.o.   MRN: [id number]  Brittney Simpson is a 83 y.o. female presenting on 05/02/2017 for Hypertension  Patient accompanied by daughter (and primary caregiver) [name], who provides majority of history and able to help facilitate history from her mother, limited by hearing.  HPI   Specialist: Nephrology - Dr Lavonia Dana [facility]) Pain Management - Dr Milinda Pointer [facility]) Vascular - Dr Leotis Pain [facility])  CHRONIC HTN: - Recent update with elevated BP at Pain Management Dr Adalberto Cole office 04/28/17, SBP 190, she has had chronic intermittent elevated SBP 150-170 usually, rarely 190, DBP has remained stable 50s-70s. Home BP checks fluctuate by report, do not have readings with them today. Current Meds - Amlodipine 5mg  daily, Lisinopril 40mg  daily, Metoprolol XL 100mg  daily, Lasix 20mg  daily   Reports good compliance, took meds today. Tolerating well, w/o complaints.  Ankle Edema, Chronic - Reports chronic problem with intermittent flares of ankle swelling, usually worse by end of day worse if on feet, and improve overnight after sleeping. Not using compression. Taking Lasix 20mg  daily. Not painful or red. Not extending up legs  Chronic Low Back Pain, Lumbar DJD Spinal Stenosis / Facet DJD - Recently seen by Dr Dossie Arbour, has had recent facet injection with relief. Additionally due to compression fracture T12, she was evaluated by Dr Demetrio [facility] and had a Kyphoplasty T12 on 01/23/17 has done well post-op with much better pain control. See prior background information - No complaint of pain today  CKD-III, secondary to HTN Chronic problem without any recent concerns, continues to be followed by Nephrology (Dr Juleen China) q 6 months, last in 02/2017 - Currently doing well, no new concerns today  H/o Recurrent UTI Prior history of UTI recurrently,  she is being treated by Nephrology with chronic antibiotic prophylaxis with Bactrim (400-80mg  daily, single strength) several years now, preventatively, occasional in past with UTI breakthrough requires Cipro PRN. - Since last visit has used approximately 3 courses of Cipro in 6 months, which have controlled her breakthrough symptoms very well, has not had any urgent or ED visits for UTI. - Currently asymptomatic. Last UTI 3 months ago. Her typical UTI symptoms are low back pain symptoms, dysuria, frequency, also will usually get some confusion and cognitive symptoms. - Requesting refill on Cipro again to have on file.  ANXIETY / INSOMNIA - Last visit with me for this problem 10/25/16, treated with continued Trazodone 50mg  nightly for sleep and refilled chronic Clonazepam, see prior notes for background information. - Interval update with last refill Clonazepam 04/21/17 faxed by request, see note - Today patient reports no new concerns. She is doing well. Anxiety is controlled. Sleeping well on medicine. - Denies any problems with withdrawal from BDZ, aware of risks of sedation and potential fall risk at age  Additional history: - Lifestyle: appetite seems slightly reduced from previous visit. Still eating 3 meals daily, some days better than others. Weight tends to fluctuate, but daughter seems to think it is more stable, actually than what patient reports. Has been 128-129 for >6 months, back in 2017 was 134 lbs - Daughter, [name] helps her regularly at home, arranges pill box  PMH - Presbycusis Bilateral / Poor Hearing - AAA, monitored by vascular surgery, last visit 01/2017, was stable on [place] compared to MRI  Health Maintenance: - Due for Flu Shot, will receive today -  UTD Pneumonia vaccine  Depression screen [facility] 2/9 05/02/2017 04/28/2017 03/25/2017  Decreased Interest 0 0 0  Down, Depressed, Hopeless 0 0 0  PHQ - 2 Score 0 0 0    Past Medical History:  Diagnosis Date  . Anxiety   .  Back ache   . Hiatal hernia   . Hyperlipidemia   . Hypertension   . Hypothyroid   . Osteopenia   . Thyroid disease   . UTI (urinary tract infection)    Past Surgical History:  Procedure Laterality Date  . HEMORRHOID SURGERY    . HERNIA REPAIR    . HERNIA REPAIR    . KYPHOPLASTY N/A 01/23/2017   Procedure: [id number];  Surgeon: Hessie Knows, MD;  Location: [facility] ORS;  Service: Orthopedics;  Laterality: N/A;   Social History   Social History  . Marital status: Widowed    Spouse name: N/A  . Number of children: N/A  . Years of education: N/A   Occupational History  . Not on file.   Social History Main Topics  . Smoking status: Never Smoker  . Smokeless tobacco: Never Used  . Alcohol use No  . Drug use: No  . Sexual activity: Yes    Birth control/ protection: Injection   Other Topics Concern  . Not on file   Social History Narrative  . No narrative on file   Family History  Problem Relation Age of Onset  . Stroke Mother   . Cancer Father    Current Outpatient Prescriptions on File Prior to Visit  Medication Sig  . acetaminophen (TYLENOL) 500 MG tablet Take 1,000 mg by mouth daily as needed for moderate pain or headache.  Marland Kitchen amLODipine (NORVASC) 5 MG tablet Take 1 tablet (5 mg total) by mouth daily. (Patient taking differently: Take 5 mg by mouth daily with lunch. )  . ASPERCREME LIDOCAINE EX Apply 1 application topically.  . brimonidine (ALPHAGAN) 0.2 % ophthalmic solution Place 1 drop into the left eye 2 (two) times daily.   . clonazePAM (KLONOPIN) 0.5 MG tablet TAKE 1/2 TABLET BY MOUTH TWICE DAILY FOR ANXIETY.  . Ergocalciferol (VITAMIN D2) 2000 UNITS TABS Take 2,000 Units by mouth daily with lunch.   . furosemide (LASIX) 20 MG tablet Take 1 tablet (20 mg total) by mouth daily.  Marland Kitchen latanoprost (XALATAN) 0.005 % ophthalmic solution Place 1 drop into the left eye at bedtime.   Marland Kitchen levothyroxine (SYNTHROID, LEVOTHROID) 125 MCG tablet TAKE 1 TABLET BY MOUTH ONCE  DAILY ON AN EMPTY STOMACH. WAIT 30 MINUTES BEFORE TAKING OTHER MEDS.  Marland Kitchen lisinopril (PRINIVIL,ZESTRIL) 40 MG tablet Take 1 tablet (40 mg total) by mouth daily.  . metoprolol succinate (TOPROL-XL) 100 MG 24 hr tablet Take 1 tablet (100 mg total) by mouth daily. Take with or immediately following a meal.  . omeprazole (PRILOSEC) 20 MG capsule Take 1 capsule (20 mg total) by mouth daily. (Patient taking differently: Take 20 mg by mouth daily with lunch. )  . sulfamethoxazole-trimethoprim (BACTRIM) 400-80 MG tablet Take 1 tablet by mouth daily with supper.   . timolol (TIMOPTIC) 0.5 % ophthalmic solution Place 1 drop into the left eye 2 (two) times daily.   . traMADol (ULTRAM) 50 MG tablet Take 1 tablet (50 mg total) by mouth 3 (three) times daily as needed for severe pain.  . traZODone (DESYREL) 50 MG tablet Take 1 tablet (50 mg total) by mouth at bedtime.   No current facility-administered medications on file prior to visit.  Review of Systems  Constitutional: Negative for activity change, appetite change, chills, diaphoresis, fatigue, fever and unexpected weight change.  HENT: Negative for congestion, hearing loss and sinus pressure.   Eyes: Negative for visual disturbance.  Respiratory: Negative for apnea, cough, chest tightness, shortness of breath and wheezing.   Cardiovascular: Positive for leg swelling (ankle edema, intermittent). Negative for chest pain and palpitations.  Gastrointestinal: Negative for abdominal pain, anal bleeding, blood in stool, constipation, diarrhea, nausea and vomiting.  Endocrine: Negative for cold intolerance and polyuria.  Genitourinary: Negative for difficulty urinating, dysuria, frequency, hematuria and urgency.  Musculoskeletal: Negative for arthralgias, back pain (Resolved, s/p kyphoplasty, back injections) and neck pain.  Skin: Negative for rash.  Allergic/Immunologic: Negative for environmental allergies.  Neurological: Negative for dizziness, weakness,  light-headedness, numbness and headaches.  Hematological: Negative for adenopathy.  Psychiatric/Behavioral: Negative for behavioral problems, dysphoric mood and sleep disturbance. The patient is not nervous/anxious.    Per HPI unless specifically indicated above     Objective:    BP (!) 150/60 (BP Location: Left Arm, Cuff Size: Normal)   Pulse (!) 55   Temp 98.2 F (36.8 C) (Oral)   Resp 16   Ht 5\' 6"  (1.676 m)   Wt 128 lb (58.1 kg)   BMI 20.66 kg/m   Wt Readings from Last 3 Encounters:  05/02/17 128 lb (58.1 kg)  04/28/17 124 lb 14.4 oz (56.7 kg)  03/25/17 128 lb (58.1 kg)    Physical Exam  Constitutional: She is oriented to person, place, and time. She appears well-developed and well-nourished. No distress.  Well-appearing elderly 83 year old female, comfortable, cooperative  HENT:  Head: Normocephalic and atraumatic.  Mouth/Throat: Oropharynx is clear and moist.  Frontal / maxillary sinuses non-tender. Nares patent without purulence or edema. Poor dentition top denture in place. Oropharynx clear without erythema, exudates, edema or asymmetry.  Very hard of hearing.  Eyes: Pupils are equal, round, and reactive to light. Conjunctivae and EOM are normal. Right eye exhibits no discharge. Left eye exhibits no discharge.  Neck: Normal range of motion. Neck supple. No thyromegaly present.  Cardiovascular: Normal rate, regular rhythm, normal heart sounds and intact distal pulses.   No murmur heard. Pulmonary/Chest: Effort normal and breath sounds normal. No respiratory distress. She has no wheezes. She has no rales.  Abdominal: Soft. Bowel sounds are normal. She exhibits no distension and no mass. There is no tenderness.  Musculoskeletal: Normal range of motion. She exhibits no edema or tenderness.  Low Back Inspection: Normal appearance, thin body habitus, some chronic spinal deformity with abnormal lumbar and thoracic spinal curvature Palpation: No distinct tenderness over  spinous processes today  Upper / Lower Extremities: - Normal muscle tone, strength bilateral upper extremities 5/5, lower extremities 5/5  Lymphadenopathy:    She has no cervical adenopathy.  Neurological: She is alert and oriented to person, place, and time.  Distal sensation intact to light touch all extremities  Skin: Skin is warm and dry. No rash noted. She is not diaphoretic. No erythema.  Psychiatric: She has a normal mood and affect. Her behavior is normal.  Well groomed, good eye contact, normal speech and thoughts  Nursing note and vitals reviewed.     Recent Labs  11/22/16 0823  HGBA1C 5.5    Results for orders placed or performed during the hospital encounter of 01/23/17  Surgical pathology  Result Value Ref Range   SURGICAL PATHOLOGY      Surgical Pathology CASE: [id number] PATIENT: Lynnell Grain  Surgical Pathology Report     SPECIMEN SUBMITTED: A. T12 bone; biopsy  CLINICAL HISTORY: None provided  PRE-OPERATIVE DIAGNOSIS: Closed wedge compression fracture  POST-OPERATIVE DIAGNOSIS: Same as pre-op     DIAGNOSIS: A. T12 BONE; BIOPSY: - CORE OF REACTIVE BONE AND MARROW STROMA CONSISTENT WITH PROVIDED CLINICAL HISTORY OF FRACTURE.   GROSS DESCRIPTION:  A. Labeled: T12 bone biopsy  Tissue fragment(s): 1  Size: 0.7 x 0.1 x 0.1 cm  Description: tan bony fragment  Entirely submitted in one cassette(s) following decalcification.    Final Diagnosis performed by Delorse Lek, MD.  Electronically signed 01/27/2017 10:33:18AM    The electronic signature indicates that the named Attending Pathologist has evaluated the specimen  Technical component performed at [facility], [address], [place], [place] [address] Lab: [phone] Dir: Darrick Penna. Evette Doffing, MD  Professional component p erformed at [facility], [facility], [place], [place], [place] [address] Lab: [phone] Dir: Dellia Nims. Reuel Derby, MD          Assessment & Plan:   Problem List Items Addressed This Visit    AAA (abdominal aortic aneurysm) without rupture ([place])    Stable per last Vascular Surgery visit 01/2017 Imaging with aortic duplex showed smaller AAA size approx 3 cm compared to previous MRI with concern 4.5 cm - Continue to follow-up with Vascular Dr Lucky Cowboy yearly for duplex monitoring      Ankle edema, bilateral    Minimal edema today, chronic problem intermittent likely venous insufficiency among other, limited activity Could be secondary to amlodipine Continue routine care elevation, compression as needed Continue lasix 20mg  daily for now - may adjust in future      Chronic anxiety    Stable, chronic anxiety, with associated insomnia Chronically on BDZ without history of abuse or dose increase Checked [place] CSRS recently with rx  Plan: 1. Reviewed chronic BDZ use for anxiety, high risk sedation, fall, confusion, withdrawal risks - she is aware of most of these, and med tolerated very well, aware not to increase dose - Continue Clonazepam 0.5mg  HALF tab BID +5 refills given in 03/2017      CKD (chronic kidney disease) stage 3, GFR 30-59 ml/min (HCC)    Stable CKD-III, suspected secondary to chronic HTN Followed by Nephrology Dr Juleen China q 6 months, and lab monitoring - Continue current regimen anti-HTN meds, still on diuretic lasix 20mg  daily - Follow-up as needed q 6 Simpson      Essential hypertension - Primary    Today elevated BP previously well controlled, also elevated at ortho/gen surgery office, despite now pain controlled, mostly SBP high DBP low Complicated by CKD-III  Plan: 1. Continue current regimen - Amlodipine 5mg  daily, Lisinopril 40mg  daily, Metoprolol XL 100mg  daily, Lasix 20mg  daily - often some meds rx by Nephrology - Discussion on med changes, overall agree to no change. Potential change if still elevated SBP >170 consistently can double amlodipine from 5mg  to 10mg  daily, caution with considering HCTZ  with history of hyponatremia is a concern and some minor edema 2. Encourage to stay active, low sodium diet, improve hydration with water 3. Follow-up q 6-7 months      Lumbar spondylosis (Chronic)    Stable, currently pain controlled. Function improved Followed by [facility] management for facet injections S/p kyphoplasty T12 No changes today      Recurrent UTI    Stable without UTI recently Followed by Nephrology. Urine checked 02/2017 On bactrim prophylaxis daily Previously successful at early control of UTI  if takes Cipro early with symptoms, Cipro 500mg  BID x 5 days, has used x 3 courses in 6 months - Refill Cipro today +2 refills, sent to pharmacy, discussed proper use      Relevant Medications   ciprofloxacin (CIPRO) 500 MG tablet    Other Visit Diagnoses    Needs flu shot       Relevant Orders   Flu vaccine HIGH DOSE PF (Completed)      Meds ordered this encounter  Medications  . ciprofloxacin (CIPRO) 500 MG tablet    Sig: Take 1 tablet (500 mg total) by mouth 2 (two) times daily. For 5 days for UTI    Dispense:  10 tablet    Refill:  2    Follow up plan: Return in about 7 months (around 11/30/2017) for HTN, Weight, CKD, Lab Review / Med Refills.  Nobie Putnam, [facility] 05/02/2017, 5:36 PM

## 2017-05-02 NOTE — Patient Instructions (Addendum)
Thank you for coming to the clinic today.  1.  Concern about elevated blood pressure, it does seem to fluctuate. I am cautious to lower it too much because bottom number is low normal. - IF continued elevated blood pressure >170 then you may take TWO Amlodipine 5mg  tablets for dose of 10mg  - try this for several days to see if helps. However, caution if lightheaded dizzy or near passing out need to reduce dose back down to 1 tablet - Other option is STOP Lasix and switch to New fluid pill that controls BP better (Hydrochlorothiazide), caution this may lower sodium still  Refilled Cipro same as before 1 tablet twice daily for 5 days if UTI then 2 refills  Please schedule a Follow-up Appointment to: Return in about 7 months (around 11/30/2017) for HTN, Weight, CKD, Lab Review / Med Refills.  If you have any other questions or concerns, please feel free to call the clinic or send a message through [place]. You may also schedule an earlier appointment if necessary.  Additionally, you may be receiving a survey about your experience at our clinic within a few days to 1 week by e-mail or mail. We value your feedback.  Nobie Putnam, DO [place]

## 2017-05-02 NOTE — Assessment & Plan Note (Signed)
Stable CKD-III, suspected secondary to chronic HTN Followed by Nephrology Dr Juleen China q 6 months, and lab monitoring - Continue current regimen anti-HTN meds, still on diuretic lasix 20mg  daily - Follow-up as needed q 6 mo

## 2017-05-02 NOTE — Assessment & Plan Note (Signed)
Stable per last Vascular Surgery visit 01/2017 Imaging with aortic duplex showed smaller AAA size approx 3 cm compared to previous MRI with concern 4.5 cm - Continue to follow-up with Vascular Dr Lucky Cowboy yearly for duplex monitoring

## 2017-05-02 NOTE — Assessment & Plan Note (Signed)
Stable, currently pain controlled. Function improved Followed by [facility] Pain management for facet injections S/p kyphoplasty T12 No changes today

## 2017-05-02 NOTE — Assessment & Plan Note (Signed)
Minimal edema today, chronic problem intermittent likely venous insufficiency among other, limited activity Could be secondary to amlodipine Continue routine care elevation, compression as needed Continue lasix 20mg  daily for now - may adjust in future

## 2017-05-16 ENCOUNTER — Other Ambulatory Visit: Payer: Self-pay

## 2017-05-16 DIAGNOSIS — E034 Atrophy of thyroid (acquired): Secondary | ICD-10-CM

## 2017-05-16 DIAGNOSIS — I10 Essential (primary) hypertension: Secondary | ICD-10-CM

## 2017-05-16 MED ORDER — LEVOTHYROXINE SODIUM 125 MCG PO TABS: ORAL_TABLET | ORAL | 3 refills | Status: DC

## 2017-05-16 MED ORDER — LISINOPRIL 40 MG PO TABS: 40.0000 mg | ORAL_TABLET | Freq: Every day | ORAL | 3 refills | Status: DC

## 2017-05-16 MED ORDER — AMLODIPINE BESYLATE 5 MG PO TABS
5.0000 mg | ORAL_TABLET | Freq: Every day | ORAL | 3 refills | Status: DC
Start: 2017-05-16 — End: 2018-04-10

## 2017-05-21 ENCOUNTER — Ambulatory Visit: Payer: Medicare Other | Admitting: Nurse Practitioner

## 2017-05-27 ENCOUNTER — Telehealth: Payer: Self-pay | Admitting: *Deleted

## 2017-05-27 NOTE — Telephone Encounter (Signed)
Patient is out of tramadol, her daughter let her script expired before she filled it. Please call out another script to [facility] on [address].

## 2017-05-27 NOTE — Telephone Encounter (Signed)
Last prescription for Tramadol was 11-19-16, with 5 refills. It is time for a med refill appointment.

## 2017-05-28 ENCOUNTER — Encounter: Payer: Self-pay | Admitting: Nurse Practitioner

## 2017-05-28 ENCOUNTER — Ambulatory Visit: Payer: Medicare Other | Attending: Nurse Practitioner | Admitting: Nurse Practitioner

## 2017-05-28 VITALS — BP 189/60 | HR 53 | Temp 96.8°F | Resp 16 | Ht 66.0 in | Wt 125.8 lb

## 2017-05-28 DIAGNOSIS — G894 Chronic pain syndrome: Secondary | ICD-10-CM | POA: Diagnosis not present

## 2017-05-28 DIAGNOSIS — G8929 Other chronic pain: Secondary | ICD-10-CM

## 2017-05-28 DIAGNOSIS — E039 Hypothyroidism, unspecified: Secondary | ICD-10-CM | POA: Diagnosis not present

## 2017-05-28 DIAGNOSIS — M79605 Pain in left leg: Secondary | ICD-10-CM | POA: Diagnosis not present

## 2017-05-28 DIAGNOSIS — I714 Abdominal aortic aneurysm, without rupture: Secondary | ICD-10-CM | POA: Diagnosis not present

## 2017-05-28 DIAGNOSIS — M47816 Spondylosis without myelopathy or radiculopathy, lumbar region: Secondary | ICD-10-CM | POA: Insufficient documentation

## 2017-05-28 DIAGNOSIS — Z79891 Long term (current) use of opiate analgesic: Secondary | ICD-10-CM | POA: Diagnosis not present

## 2017-05-28 DIAGNOSIS — I129 Hypertensive chronic kidney disease with stage 1 through stage 4 chronic kidney disease, or unspecified chronic kidney disease: Secondary | ICD-10-CM | POA: Insufficient documentation

## 2017-05-28 DIAGNOSIS — Z87440 Personal history of urinary (tract) infections: Secondary | ICD-10-CM | POA: Insufficient documentation

## 2017-05-28 DIAGNOSIS — N390 Urinary tract infection, site not specified: Secondary | ICD-10-CM | POA: Diagnosis not present

## 2017-05-28 DIAGNOSIS — M79604 Pain in right leg: Secondary | ICD-10-CM

## 2017-05-28 DIAGNOSIS — Z760 Encounter for issue of repeat prescription: Secondary | ICD-10-CM | POA: Insufficient documentation

## 2017-05-28 DIAGNOSIS — M48061 Spinal stenosis, lumbar region without neurogenic claudication: Secondary | ICD-10-CM | POA: Diagnosis not present

## 2017-05-28 DIAGNOSIS — K219 Gastro-esophageal reflux disease without esophagitis: Secondary | ICD-10-CM | POA: Diagnosis not present

## 2017-05-28 DIAGNOSIS — R51 Headache: Secondary | ICD-10-CM | POA: Insufficient documentation

## 2017-05-28 DIAGNOSIS — N183 Chronic kidney disease, stage 3 (moderate): Secondary | ICD-10-CM | POA: Diagnosis not present

## 2017-05-28 DIAGNOSIS — G4700 Insomnia, unspecified: Secondary | ICD-10-CM | POA: Insufficient documentation

## 2017-05-28 DIAGNOSIS — M7918 Myalgia, other site: Secondary | ICD-10-CM | POA: Diagnosis not present

## 2017-05-28 DIAGNOSIS — M533 Sacrococcygeal disorders, not elsewhere classified: Secondary | ICD-10-CM | POA: Diagnosis not present

## 2017-05-28 DIAGNOSIS — M7061 Trochanteric bursitis, right hip: Secondary | ICD-10-CM | POA: Insufficient documentation

## 2017-05-28 DIAGNOSIS — M4317 Spondylolisthesis, lumbosacral region: Secondary | ICD-10-CM | POA: Insufficient documentation

## 2017-05-28 DIAGNOSIS — E785 Hyperlipidemia, unspecified: Secondary | ICD-10-CM | POA: Insufficient documentation

## 2017-05-28 DIAGNOSIS — H9193 Unspecified hearing loss, bilateral: Secondary | ICD-10-CM | POA: Diagnosis not present

## 2017-05-28 DIAGNOSIS — R7309 Other abnormal glucose: Secondary | ICD-10-CM | POA: Insufficient documentation

## 2017-05-28 DIAGNOSIS — M810 Age-related osteoporosis without current pathological fracture: Secondary | ICD-10-CM | POA: Insufficient documentation

## 2017-05-28 DIAGNOSIS — M1610 Unilateral primary osteoarthritis, unspecified hip: Secondary | ICD-10-CM | POA: Insufficient documentation

## 2017-05-28 DIAGNOSIS — M545 Low back pain: Secondary | ICD-10-CM | POA: Diagnosis not present

## 2017-05-28 MED ORDER — TRAMADOL HCL 50 MG PO TABS: 50.0000 mg | ORAL_TABLET | Freq: Three times a day (TID) | ORAL | 5 refills | Status: DC | PRN

## 2017-05-28 NOTE — Progress Notes (Signed)
Nursing Pain Medication Assessment:  Safety precautions to be maintained throughout the outpatient stay will include: orient to surroundings, keep bed in low position, maintain call bell within reach at all times, provide assistance with transfer out of bed and ambulation.  Medication Inspection Compliance: Pill count conducted under aseptic conditions, in front of the patient. Neither the pills nor the bottle was removed from the patient's sight at any time. Once count was completed pills were immediately returned to the patient in their original bottle.  Medication: Tramadol (Ultram) Pill/Patch Count: 0 of 90 pills remain Pill/Patch Appearance: Markings consistent with prescribed medication Bottle Appearance: Standard pharmacy container. Clearly labeled. Filled Date: 09 / 29 / 2018 Last Medication intake:  Yesterday

## 2017-05-28 NOTE — Patient Instructions (Signed)

## 2017-05-28 NOTE — Progress Notes (Signed)
Patient's Name: Brittney Simpson  MRN: [id number]  Referring Provider: Nobie Putnam *  DOB: 07/10/1928  PCP: Olin Hauser, DO  DOS: 05/28/2017  Note by: Vevelyn Francois NP  Service setting: Ambulatory outpatient  Specialty: Interventional Pain Management  Location: [facility] (AMB) Pain Management Facility    Patient type: Established    Primary Reason(s) for Visit: Encounter for prescription drug management. (Level of risk: moderate)  CC: Headache  HPI  Brittney Simpson is a 83 y.o. year old, female patient, who comes today for a medication management evaluation. She has Chronic pain; Long term current use of opiate analgesic; Long term prescription opiate use; Opiate use (15 MME/Day); Encounter for therapeutic drug level monitoring; Lumbar facet syndrome (Bilateral) (R>L); Chronic low back pain (Bilateral) (R>L); Spondylolisthesis of lumbosacral region (L2-3 and L5-S1); Lumbar spinal stenosis (9 mm at L3-4); Trochanteric bursitis (Right); Chronic hip pain (Right); Osteoarthritis of hip (Right); Chronic sacroiliac joint pain (Right); Chronic lumbar radicular pain (Right); Chronic lower extremity pain (Right); Myofascial pain; Encounter for chronic pain management; Essential hypertension; CKD (chronic kidney disease) stage 3, GFR 30-59 ml/min ([place]); Skin lesion of scalp; Chronic anxiety; Insomnia; Hypothyroidism; Lumbar spondylosis; Recurrent UTI; GERD (gastroesophageal reflux disease); Presbycusis of both ears; Hyperlipidemia; Abnormal glucose; Bilateral hearing loss; Osteoporosis; AAA (abdominal aortic aneurysm) without rupture (HCC); and Ankle edema, bilateral on her problem list. Her primarily concern today is the Headache  Pain Assessment: Location: Other (Comment) (head just hurts all over) Head Radiating: na Onset: More than a month ago Duration: Chronic pain Quality: Aching, Constant Severity: 4 /10 (self-reported pain score)  Note: Reported level is compatible with  observation.                          Effect on ADL: patient is able to go about her day. Timing: Constant Modifying factors: medicine  Brittney Simpson was last scheduled for an appointment on Visit date not found for medication management. During today's appointment we reviewed Brittney Simpson's chronic pain status, as well as her outpatient medication regimen. She states that she has dull headache daily. She denies any visual changes. She denies any new concerns. She states that she continues to have relief with her back pain from the facet NB. She states she does use the Tramadol TID. She denies any side effects.   The patient  reports that she does not use drugs. Her body mass index is 20.3 kg/m.  Further details on both, my assessment(s), as well as the proposed treatment plan, please see below.  Controlled Substance Pharmacotherapy Assessment REMS (Risk Evaluation and Mitigation Strategy)  Analgesic:Tramadol 50 mg 1 tablet by mouth every 8 hours (150 mg/day of tramadol) MME/day:15 mg/day. Brittney Martins, RN   Brittney Billow, RN  05/28/2017  9:43 AM  Sign at close encounter Nursing Pain Medication Assessment:  Safety precautions to be maintained throughout the outpatient stay will include: orient to surroundings, keep bed in low position, maintain call bell within reach at all times, provide assistance with transfer out of bed and ambulation.  Medication Inspection Compliance: Pill count conducted under aseptic conditions, in front of the patient. Neither the pills nor the bottle was removed from the patient's sight at any time. Once count was completed pills were immediately returned to the patient in their original bottle.  Medication: Tramadol (Ultram) Pill/Patch Count: 0 of 90 pills remain Pill/Patch Appearance: Markings consistent with prescribed medication Bottle Appearance: Standard pharmacy container. Clearly labeled. Filled  Date: 09 / 29 / 2018 Last Medication intake:   Yesterday   Pharmacokinetics: Liberation and absorption (onset of action): WNL Distribution (time to peak effect): WNL Metabolism and excretion (duration of action): WNL         Pharmacodynamics: Desired effects: Analgesia: Brittney Simpson reports >50% benefit. Functional ability: Patient reports that medication allows her to accomplish basic ADLs Clinically meaningful improvement in function (CMIF): Sustained CMIF goals met Perceived effectiveness: Described as relatively effective, allowing for increase in activities of daily living (ADL) Undesirable effects: Side-effects or Adverse reactions: None reported Monitoring: [place] PMP: Online review of the past 57-monthperiod conducted. Compliant with practice rules and regulations Last UDS on record: No results found for: SUMMARY UDS interpretation: Compliant          Medication Assessment Form: Reviewed. Patient indicates being compliant with therapy Treatment compliance: Compliant Risk Assessment Profile: Aberrant behavior: See prior evaluations. None observed or detected today Comorbid factors increasing risk of overdose: See prior notes. No additional risks detected today Risk of substance use disorder (SUD): Low     Opioid Risk Tool - 04/28/17 1051      Family History of Substance Abuse   Alcohol Negative   Illegal Drugs Negative   Rx Drugs Negative     Personal History of Substance Abuse   Alcohol Negative   Illegal Drugs Negative   Rx Drugs Negative     Age   Age between 81-45years  No     History of Preadolescent Sexual Abuse   History of Preadolescent Sexual Abuse Negative or Female     Psychological Disease   Psychological Disease Negative   Depression Negative     Total Score   Opioid Risk Tool Scoring 0   Opioid Risk Interpretation Low Risk     ORT Scoring interpretation table:  Score <3 = Low Risk for SUD  Score between 4-7 = Moderate Risk for SUD  Score >8 = High Risk for Opioid Abuse   Risk Mitigation  Strategies:  Patient Counseling: Covered Patient-Prescriber Agreement (PPA): Present and active  Notification to other healthcare providers: Done  Pharmacologic Plan: No change in therapy, at this time  Laboratory Chemistry  Inflammation Markers (CRP: Acute Phase) (ESR: Chronic Phase) No results found for: CRP, ESRSEDRATE               Renal Function Markers Lab Results  Component Value Date   BUN 14 11/22/2016   CREATININE 0.86 11/22/2016   GFRAA 71 11/22/2016   GFRNONAA 61 11/22/2016                 Hepatic Function Markers Lab Results  Component Value Date   AST 11 11/22/2016   ALT 9 11/22/2016   ALBUMIN 3.6 11/22/2016   ALKPHOS 59 11/22/2016                 Electrolytes Lab Results  Component Value Date   NA 136 11/22/2016   K 4.2 11/22/2016   CL 101 11/22/2016   CALCIUM 8.9 11/22/2016   MG 2.1 01/14/2014                 Neuropathy Markers No results found for: [id number]              Bone Pathology Markers Lab Results  Component Value Date   ALKPHOS 59 11/22/2016   CALCIUM 8.9 11/22/2016                 Coagulation  Parameters Lab Results  Component Value Date   PLT 171 11/22/2016                 Cardiovascular Markers Lab Results  Component Value Date   HGB 11.9 11/22/2016   HCT 38.8 11/22/2016                 Note: Lab results reviewed.  Recent Diagnostic Imaging Results  DG C-Arm 1-60 Min-No Report Fluoroscopy was utilized by the requesting physician.  No radiographic  interpretation.   Complexity Note: Imaging results reviewed. Results shared with Ms. Fennema, using Layman's terms.                         Meds   Current Outpatient Prescriptions:  .  acetaminophen (TYLENOL) 500 MG tablet, Take 1,000 mg by mouth daily as needed for moderate pain or headache., Disp: , Rfl:  .  amLODipine (NORVASC) 5 MG tablet, Take 1 tablet (5 mg total) by mouth daily., Disp: 90 tablet, Rfl: 3 .  ASPERCREME LIDOCAINE EX, Apply 1 application  topically., Disp: , Rfl:  .  brimonidine (ALPHAGAN) 0.2 % ophthalmic solution, Place 1 drop into the left eye 2 (two) times daily. , Disp: , Rfl:  .  clonazePAM (KLONOPIN) 0.5 MG tablet, TAKE 1/2 TABLET BY MOUTH TWICE DAILY FOR ANXIETY., Disp: 30 tablet, Rfl: 5 .  Ergocalciferol (VITAMIN D2) 2000 UNITS TABS, Take 2,000 Units by mouth daily with lunch. , Disp: , Rfl:  .  furosemide (LASIX) 20 MG tablet, Take 1 tablet (20 mg total) by mouth daily., Disp: 30 tablet, Rfl: 12 .  latanoprost (XALATAN) 0.005 % ophthalmic solution, Place 1 drop into the left eye at bedtime. , Disp: , Rfl:  .  levothyroxine (SYNTHROID, LEVOTHROID) 125 MCG tablet, TAKE 1 TABLET BY MOUTH ONCE DAILY ON AN EMPTY STOMACH. WAIT 30 MINUTES BEFORE TAKING OTHER MEDS., Disp: 90 tablet, Rfl: 3 .  lisinopril (PRINIVIL,ZESTRIL) 40 MG tablet, Take 1 tablet (40 mg total) by mouth daily., Disp: 90 tablet, Rfl: 3 .  metoprolol succinate (TOPROL-XL) 100 MG 24 hr tablet, Take 1 tablet (100 mg total) by mouth daily. Take with or immediately following a meal., Disp: 30 tablet, Rfl: 11 .  omeprazole (PRILOSEC) 20 MG capsule, Take 1 capsule (20 mg total) by mouth daily. (Patient taking differently: Take 20 mg by mouth daily with lunch. ), Disp: 30 capsule, Rfl: 11 .  sulfamethoxazole-trimethoprim (BACTRIM) 400-80 MG tablet, Take 1 tablet by mouth daily with supper. , Disp: , Rfl:  .  timolol (TIMOPTIC) 0.5 % ophthalmic solution, Place 1 drop into the left eye 2 (two) times daily. , Disp: , Rfl:  .  traMADol (ULTRAM) 50 MG tablet, Take 1 tablet (50 mg total) by mouth 3 (three) times daily as needed for severe pain., Disp: 90 tablet, Rfl: 5 .  traZODone (DESYREL) 50 MG tablet, Take 1 tablet (50 mg total) by mouth at bedtime., Disp: 30 tablet, Rfl: 11  ROS  Constitutional: Denies any fever or chills Gastrointestinal: No reported hemesis, hematochezia, vomiting, or acute GI distress Musculoskeletal: Denies any acute onset joint swelling, redness,  loss of ROM, or weakness Neurological: No reported episodes of acute onset apraxia, aphasia, dysarthria, agnosia, amnesia, paralysis, loss of coordination, or loss of consciousness  Allergies  Ms. Bolanos has No Known Allergies.  [place]  Drug: Ms. Heitman  reports that she does not use drugs. Alcohol:  reports that she does not drink alcohol. Tobacco:  reports that she has never smoked. She has never used smokeless tobacco. Medical:  has a past medical history of Anxiety; Back ache; Hiatal hernia; Hyperlipidemia; Hypertension; Hypothyroid; Osteopenia; Thyroid disease; and UTI (urinary tract infection). Surgical: Ms. Feltes  has a past surgical history that includes Hernia repair; Hernia repair; Hemorrhoid surgery; and Kyphoplasty (N/A, 01/23/2017). Family: family history includes Cancer in her father; Stroke in her mother.  Constitutional Exam  General appearance: Well nourished, well developed, and well hydrated. In no apparent acute distress Vitals:   05/28/17 0943  BP: (!) 189/60  Pulse: (!) 53  Resp: 16  Temp: (!) 96.8 F (36 C)  TempSrc: Oral  SpO2: 100%  Weight: 125 lb 12.8 oz (57.1 kg)  Height: '5\' 6"'  (1.676 m)   BMI Assessment: Estimated body mass index is 20.3 kg/m as calculated from the following:   Height as of this encounter: '5\' 6"'  (1.676 m).   Weight as of this encounter: 125 lb 12.8 oz (57.1 kg).  BMI interpretation table: BMI level Category Range association with higher incidence of chronic pain  <18 kg/m2 Underweight   18.5-24.9 kg/m2 Ideal body weight   25-29.9 kg/m2 Overweight Increased incidence by 20%  30-34.9 kg/m2 Obese (Class I) Increased incidence by 68%  35-39.9 kg/m2 Severe obesity (Class II) Increased incidence by 136%  >40 kg/m2 Extreme obesity (Class III) Increased incidence by 254%   BMI Readings from Last 4 Encounters:  05/28/17 20.30 kg/m  05/02/17 20.66 kg/m  04/28/17 20.16 kg/m  03/25/17 20.66 kg/m   Wt Readings from Last 4  Encounters:  05/28/17 125 lb 12.8 oz (57.1 kg)  05/02/17 128 lb (58.1 kg)  04/28/17 124 lb 14.4 oz (56.7 kg)  03/25/17 128 lb (58.1 kg)  Psych/Mental status: Alert, oriented x 3 (person, place, & time)       Eyes: PERLA Respiratory: No evidence of acute respiratory distress  Lumbar Spine Area Exam  Skin & Axial Inspection: No masses, redness, or swelling Alignment: Symmetrical Functional ROM: Unrestricted ROM      Stability: No instability detected Muscle Tone/Strength: Functionally intact. No obvious neuro-muscular anomalies detected. Sensory (Neurological): Unimpaired Palpation: Complains of area being tender to palpation       Provocative Tests: Lumbar Hyperextension and rotation test: evaluation deferred today       Lumbar Lateral bending test: evaluation deferred today       Patrick's Maneuver: evaluation deferred today                    Gait & Posture Assessment  Ambulation: Unassisted Gait: Relatively normal for age and body habitus Posture: WNL   Lower Extremity Exam    Side: Right lower extremity  Side: Left lower extremity  Skin & Extremity Inspection: Skin color, temperature, and hair growth are WNL. No peripheral edema or cyanosis. No masses, redness, swelling, asymmetry, or associated skin lesions. No contractures.  Skin & Extremity Inspection: Skin color, temperature, and hair growth are WNL. No peripheral edema or cyanosis. No masses, redness, swelling, asymmetry, or associated skin lesions. No contractures.  Functional ROM: Unrestricted ROM          Functional ROM: Unrestricted ROM          Muscle Tone/Strength: Functionally intact. No obvious neuro-muscular anomalies detected.  Muscle Tone/Strength: Functionally intact. No obvious neuro-muscular anomalies detected.  Sensory (Neurological): Unimpaired  Sensory (Neurological): Unimpaired  Palpation: No palpable anomalies  Palpation: No palpable anomalies   Assessment  Primary Diagnosis & Pertinent Problem  List:  The primary encounter diagnosis was Chronic low back pain (Bilateral) (R>L). Diagnoses of Chronic lower extremity pain (Right), Chronic sacroiliac joint pain (Right), Chronic pain syndrome, and Long term current use of opiate analgesic were also pertinent to this visit.  Status Diagnosis  Controlled Controlled Controlled 1. Chronic low back pain (Bilateral) (R>L)   2. Chronic lower extremity pain (Right)   3. Chronic sacroiliac joint pain (Right)   4. Chronic pain syndrome   5. Long term current use of opiate analgesic     Problems updated and reviewed during this visit: No problems updated. Plan of Care  Pharmacotherapy (Medications Ordered): Meds ordered this encounter  Medications  . traMADol (ULTRAM) 50 MG tablet    Sig: Take 1 tablet (50 mg total) by mouth 3 (three) times daily as needed for severe pain.    Dispense:  90 tablet    Refill:  5    Do not place this medication, or any other prescription from our practice, on "Automatic Refill". Patient may have prescription filled one day early if pharmacy is closed on scheduled refill date.    Order Specific Question:   Supervising Provider    Answer:   Milinda Pointer [[id number]]   New Prescriptions   No medications on file   Medications administered today: Ms. Slee had no medications administered during this visit. Lab-work, procedure(s), and/or referral(s): Orders Placed This Encounter  Procedures  . ToxASSURE Select 13 (MW), Urine   Imaging and/or referral(s): None  Interventional therapies: Planned, scheduled, and/or pending:   Not at this time.   Considering:   Palliative bilateral lumbar facet block  Possible bilateral lumbar facet radiofrequency ablation.  Diagnostic right-sided intra-articular hip joint injection  Palliative right-sided L3-4 lumbar epidural steroid injection  Diagnostic right-sided transforaminal L3-4 epidural steroid injection  Diagnostic right-sided trochanteric bursa injection   Diagnostic right-sided sacroiliac joint block    Palliative PRN treatment(s):   Palliative bilateral lumbar facet block  Diagnostic right-sided intra-articular hip joint injection  Palliative right-sided L3-4 lumbar epidural steroid injection  Diagnostic right-sided transforaminal L3-4 epidural steroid injection  Diagnostic right-sided trochanteric bursa injection  Diagnostic right-sided sacroiliac joint block    Provider-requested follow-up: Return in about 6 months (around 11/25/2017) for MedMgmt.  Future Appointments Date Time Provider [place]  10/28/2017 2:30 PM Vevelyn Francois, NP [facility] None  12/05/2017 2:20 PM Olin Hauser, DO [place] None  03/10/2018 8:30 AM [facility] 2 [facility] None  03/10/2018 9:45 AM Dew, Erskine Squibb, MD [facility] None   Primary Care Physician: Olin Hauser, DO Location: [facility] Outpatient [facility] Management [facility] Note by: Vevelyn Francois NP Date: 05/28/2017; Time: 11:51 AM  Pain Score Disclaimer: We use the NRS-11 scale. This is a self-reported, subjective measurement of pain severity with only modest accuracy. It is used primarily to identify changes within a particular patient. It must be understood that outpatient pain scales are significantly less accurate that those used for research, where they can be applied under ideal controlled circumstances with minimal exposure to variables. In reality, the score is likely to be a combination of pain intensity and pain affect, where pain affect describes the degree of emotional arousal or changes in action readiness caused by the sensory experience of pain. Factors such as social and work situation, setting, emotional state, anxiety levels, expectation, and prior pain experience may influence pain perception and show large inter-individual differences that may also be affected by time variables.  Patient instructions provided during this appointment: Patient Instructions  ____________________________________________________________________________________________  Medication Rules  Applies to: All patients receiving prescriptions (written or electronic).  Pharmacy of record: Pharmacy where electronic prescriptions will be sent. If written prescriptions are taken to a different pharmacy, please inform the nursing staff. The pharmacy listed in the electronic medical record should be the one where you would like electronic prescriptions to be sent.  Prescription refills: Only during scheduled appointments. Applies to both, written and electronic prescriptions.  NOTE: The following applies primarily to controlled substances (Opioid* Pain Medications).   Patient's responsibilities: 1. Pain Pills: Bring all pain pills to every appointment (except for procedure appointments). 2. Pill Bottles: Bring pills in original pharmacy bottle. Always bring newest bottle. Bring bottle, even if empty. 3. Medication refills: You are responsible for knowing and keeping track of what medications you need refilled. The day before your appointment, write a list of all prescriptions that need to be refilled. Bring that list to your appointment and give it to the admitting nurse. Prescriptions will be written only during appointments. If you forget a medication, it will not be "Called in", "Faxed", or "electronically sent". You will need to get another appointment to get these prescribed. 4. Prescription Accuracy: You are responsible for carefully inspecting your prescriptions before leaving our office. Have the discharge nurse carefully go over each prescription with you, before taking them home. Make sure that your name is accurately spelled, that your address is correct. Check the name and dose of your medication to make sure it is accurate. Check the number of pills, and the written instructions to make sure they are clear and accurate. Make sure that you are given enough medication to  last until your next medication refill appointment. 5. Taking Medication: Take medication as prescribed. Never take more pills than instructed. Never take medication more frequently than prescribed. Taking less pills or less frequently is permitted and encouraged, when it comes to controlled substances (written prescriptions).  6. Inform other Doctors: Always inform, all of your healthcare providers, of all the medications you take. 7. Pain Medication from other Providers: You are not allowed to accept any additional pain medication from any other Doctor or Healthcare provider. There are two exceptions to this rule. (see below) In the event that you require additional pain medication, you are responsible for notifying us, as stated below. 8. Medication Agreement: You are responsible for carefully reading and following our Medication Agreement. This must be signed before receiving any prescriptions from our practice. Safely store a copy of your signed Agreement. Violations to the Agreement will result in no further prescriptions. (Additional copies of our Medication Agreement are available upon request.) 9. Laws, Rules, & Regulations: All patients are expected to follow all Federal and [employer], Rules, [employer]. Ignorance of the Laws does not constitute a valid excuse. The use of any illegal substances is prohibited. 10. Adopted CDC guidelines & recommendations: Target dosing levels will be at or below 60 MME/day. Use of benzodiazepines** is not recommended.  Exceptions: There are only two exceptions to the rule of not receiving pain medications from other Healthcare Providers. 1. Exception #1 (Emergencies): In the event of an emergency (i.e.: accident requiring emergency care), you are allowed to receive additional pain medication. However, you are responsible for: As soon as you are able, call our office [phone], at any time of the day or night, and leave a message stating your  name, the date and nature of the emergency, and the name and dose of the medication  prescribed. In the event that your call is answered by a member of our staff, make sure to document and save the date, time, and the name of the person that took your information.  2. Exception #2 (Planned Surgery): In the event that you are scheduled by another doctor or dentist to have any type of surgery or procedure, you are allowed (for a period no longer than 30 days), to receive additional pain medication, for the acute post-op pain. However, in this case, you are responsible for picking up a copy of our "Post-op Pain Management for Surgeons" handout, and giving it to your surgeon or dentist. This document is available at our office, and does not require an appointment to obtain it. Simply go to our office during business hours (Monday-Thursday from 8:00 AM to 4:00 PM) (Friday 8:00 AM to 12:00 Noon) or if you have a scheduled appointment with [place], prior to your surgery, and ask for it by name. In addition, you will need to provide [place] with your name, name of your surgeon, type of surgery, and date of procedure or surgery.  *Opioid medications include: morphine, codeine, oxycodone, oxymorphone, hydrocodone, hydromorphone, meperidine, tramadol, tapentadol, buprenorphine, fentanyl, methadone. **Benzodiazepine medications include: diazepam (Valium), alprazolam (Xanax), clonazepam (Klonopine), lorazepam (Ativan), clorazepate (Tranxene), chlordiazepoxide (Librium), estazolam (Prosom), oxazepam (Serax), temazepam (Restoril), triazolam (Halcion)  ____________________________________________________________________________________________

## 2017-06-04 ENCOUNTER — Ambulatory Visit: Payer: Medicare Other | Admitting: Nurse Practitioner

## 2017-08-08 DIAGNOSIS — H401133 Primary open-angle glaucoma, bilateral, severe stage: Secondary | ICD-10-CM | POA: Diagnosis not present

## 2017-08-19 IMAGING — CR DG LUMBAR SPINE COMPLETE 4+V
5 series · 5 of 5 positions shown · non-contrast
Comparison: CT abdomen pelvis- 06/14/2016; pelvic
radiographs-earlier same day

CLINICAL DATA: Pt arrived via EMS from home for reports of fall in
bathroom. Pt states she was getting up from the toilet and not sure
what happened but she fell back and hit her back on the toilet; Pt
reports low back pain radiating into left leg.

EXAM:
LUMBAR SPINE - COMPLETE 4+ VIEW

[l-spine ap]
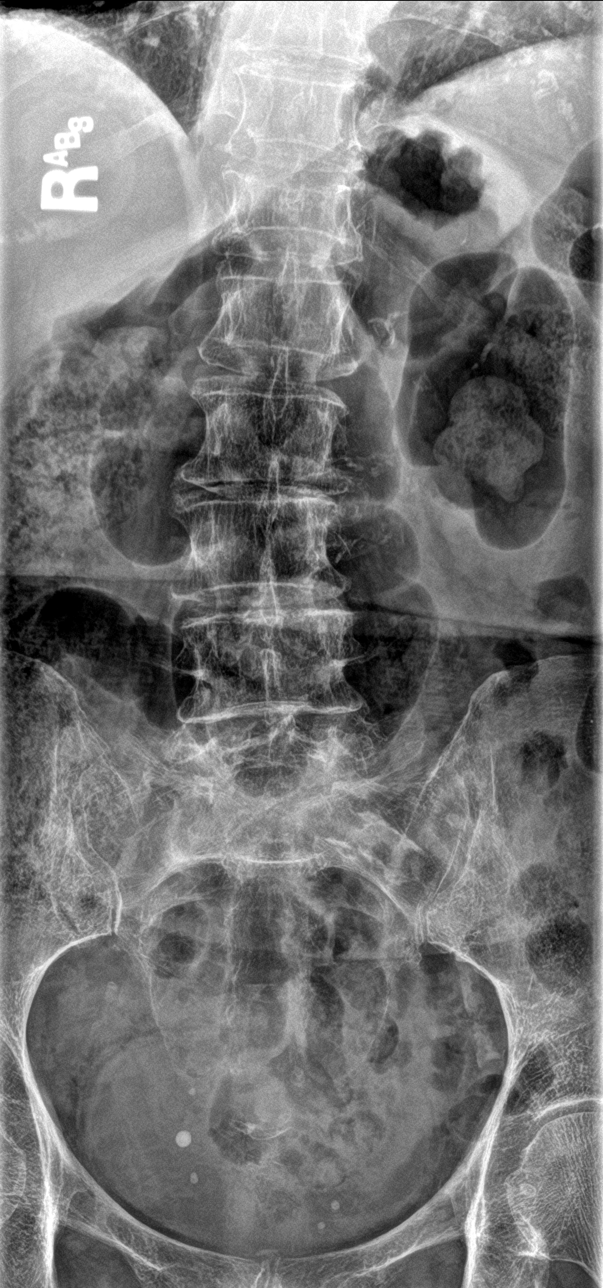

[l-spine obl (1 of 2)]
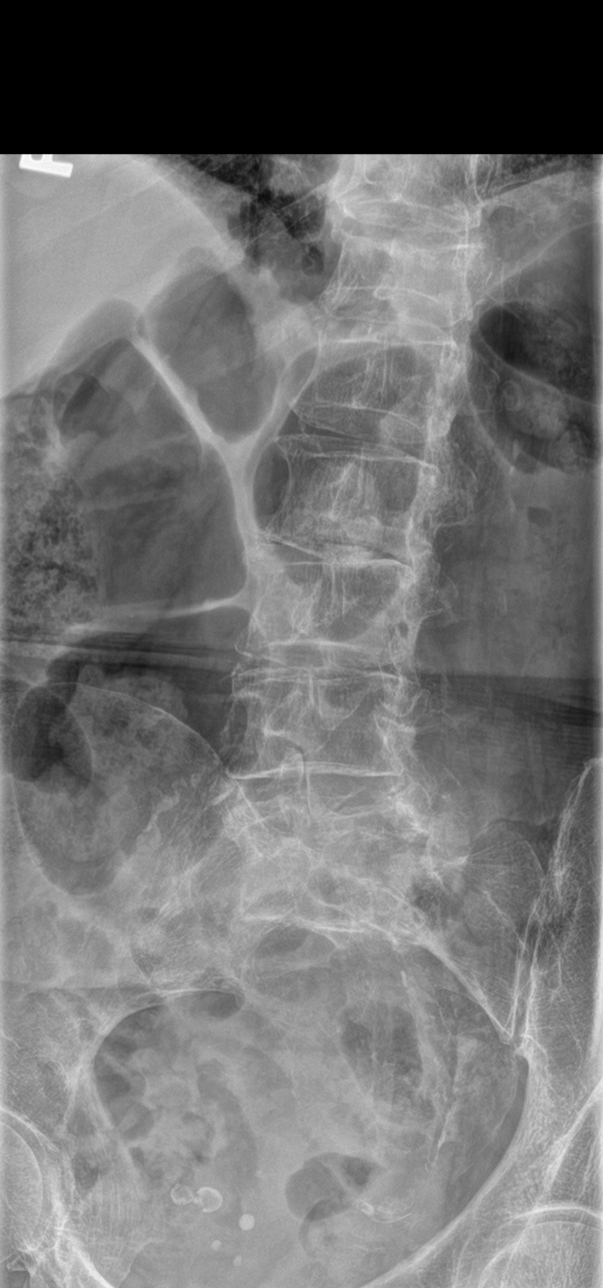

[l-spine obl (2 of 2)]
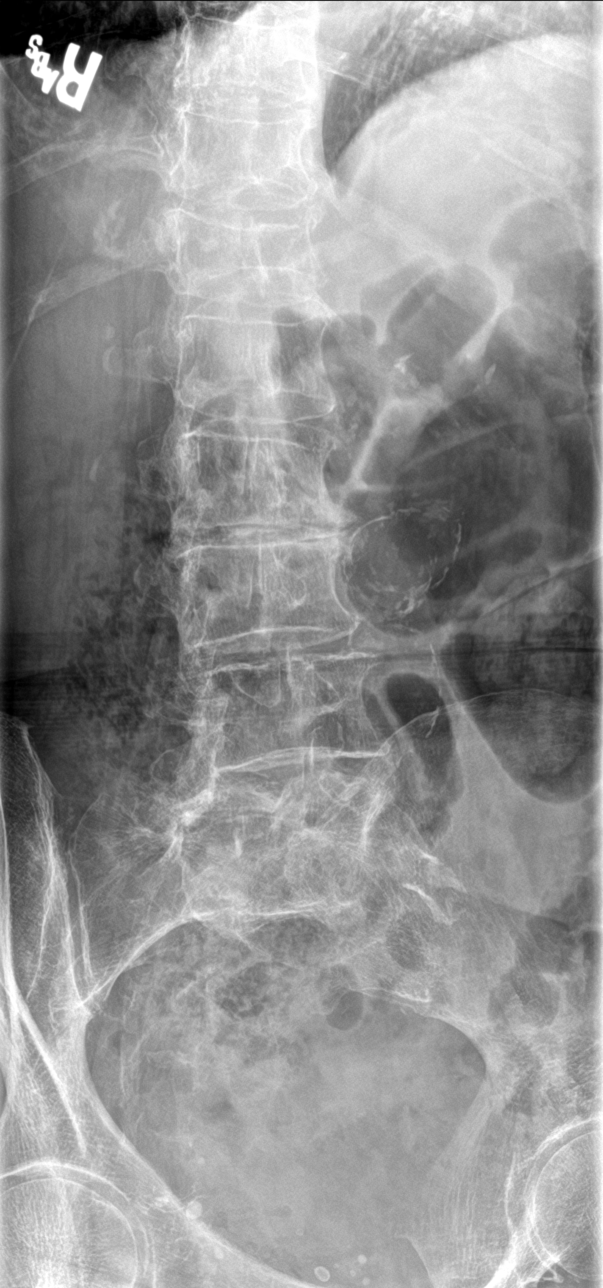

[l-spine lat]
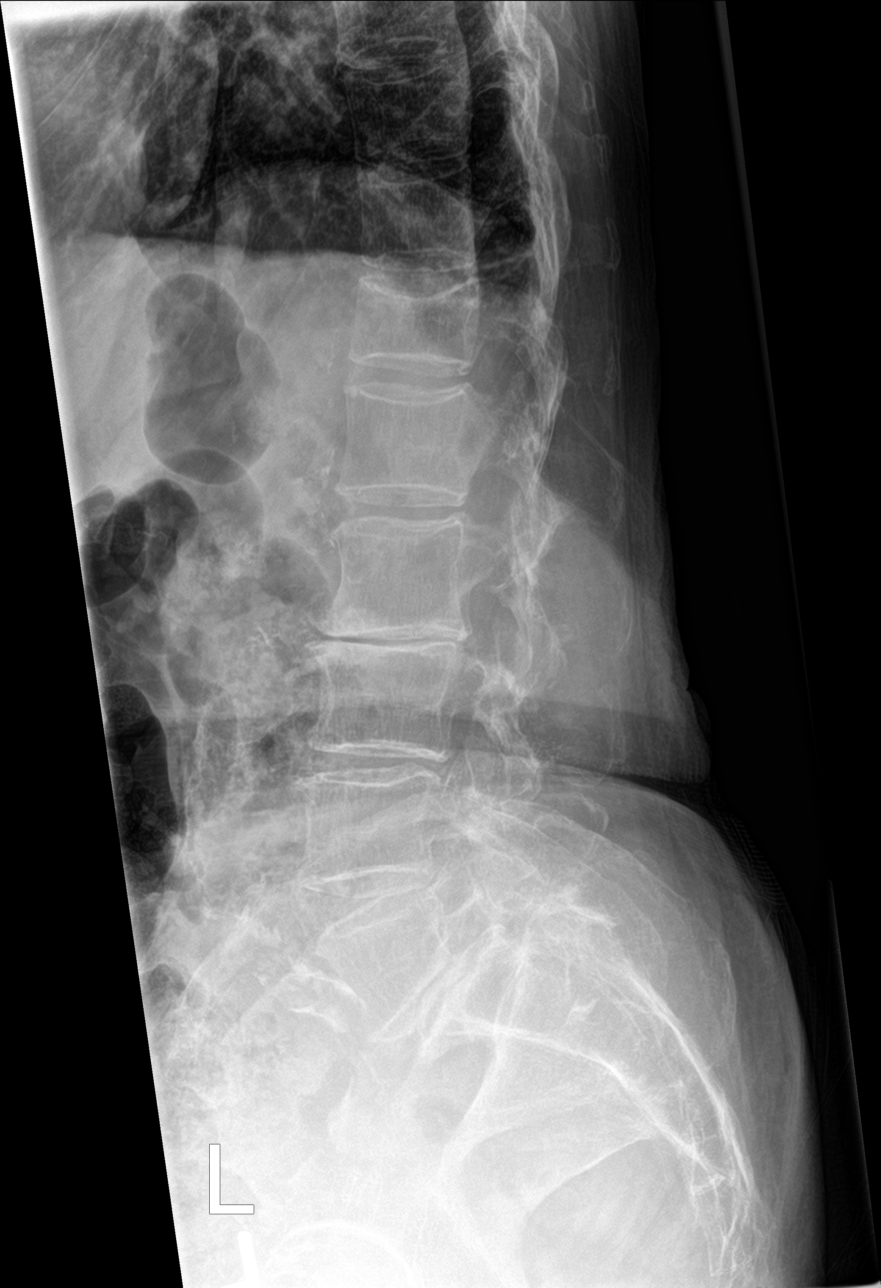

[l-spine spot]
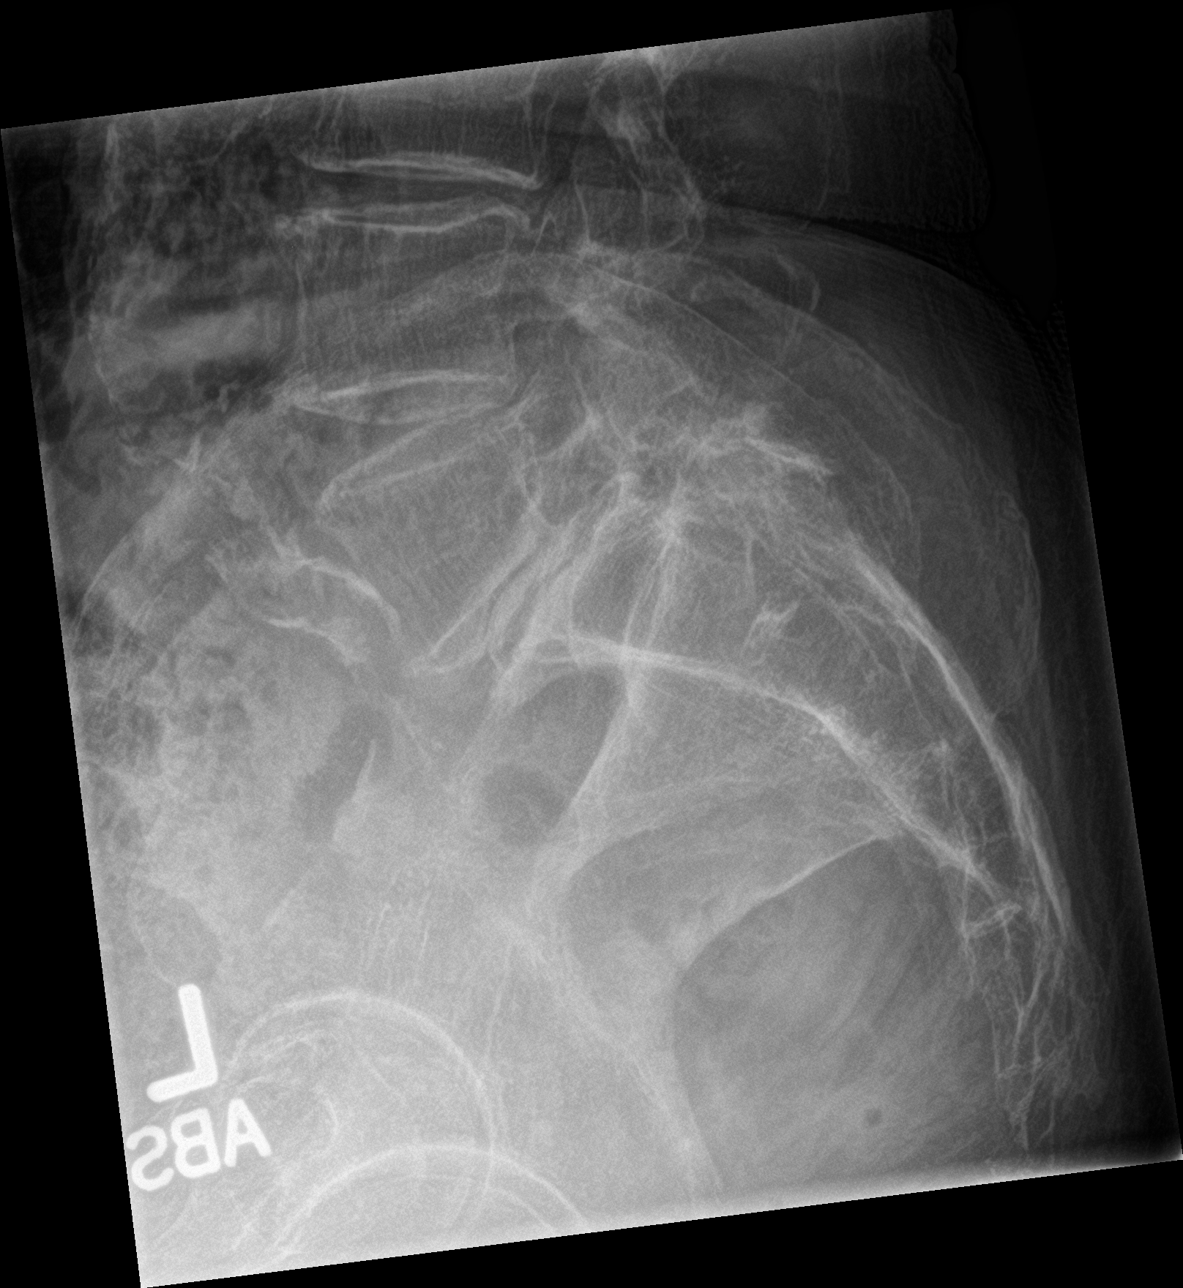

[5 of 5 positions shown; findings below may reference images not displayed]

FINDINGS: There are 5 non rib-bearing lumbar type vertebral bodies.

There is a mild scoliotic curvature of the thoracolumbar spine with
dominant caudal component convex to the right measuring
approximately 10 degrees (as measured from the superior endplate of
T11 to the inferior endplate of L4. Re- demonstrated grade 2
anterolisthesis measuring approximately 1.4 cm with associated
bilateral L5 pars defects, similar to abdominal CT performed
06/14/2016. There is un chain mild straightening expected lumbar
lordosis.

Mild (approximately 25%) compression deformity involving the
superior endplate of the T12 vertebral body. Remaining lumbar
vertebral body heights appear preserved.

Moderate severe multilevel lumbar spine DDD, worse at L2-L3 with
disc space height loss, endplate irregularity and sclerosis.

Irregular atherosclerotic plaque compatible with known abdominal
aortic aneurysm. Large colonic stool burden. Several phleboliths
overlie the lower pelvis.
IMPRESSION: 1. Mild (approximately 25%) compression deformity involving the
superior endplate of T12, new compared to abdominal CT performed
06/14/2016. Correlation for point tenderness at this location is
recommended.
2. Moderate severe multilevel lumbar spine DDD, worse at L2-L3.
3. Aortic aneurysm NOS (EJIVJ-0M3.0), as demonstrated on abdominal
CT performed [DATE].
4.  Aortic Atherosclerosis (EJIVJ-[id number].E).

## 2017-08-19 IMAGING — CR DG PELVIS 1-2V
1 series · 1 of 1 positions shown · non-contrast
Comparison: Lumbar spine radiographs-earlier same day

CLINICAL DATA: Pt arrived via EMS from home for reports of fall in
bathroom. Pt states she was getting up from the toilet and not sure
what happened but she fell back and hit her back on the toilet; Pt
reports low back pain radiating into left leg.

EXAM:
PELVIS - 1-2 VIEW

[pelvis ap]
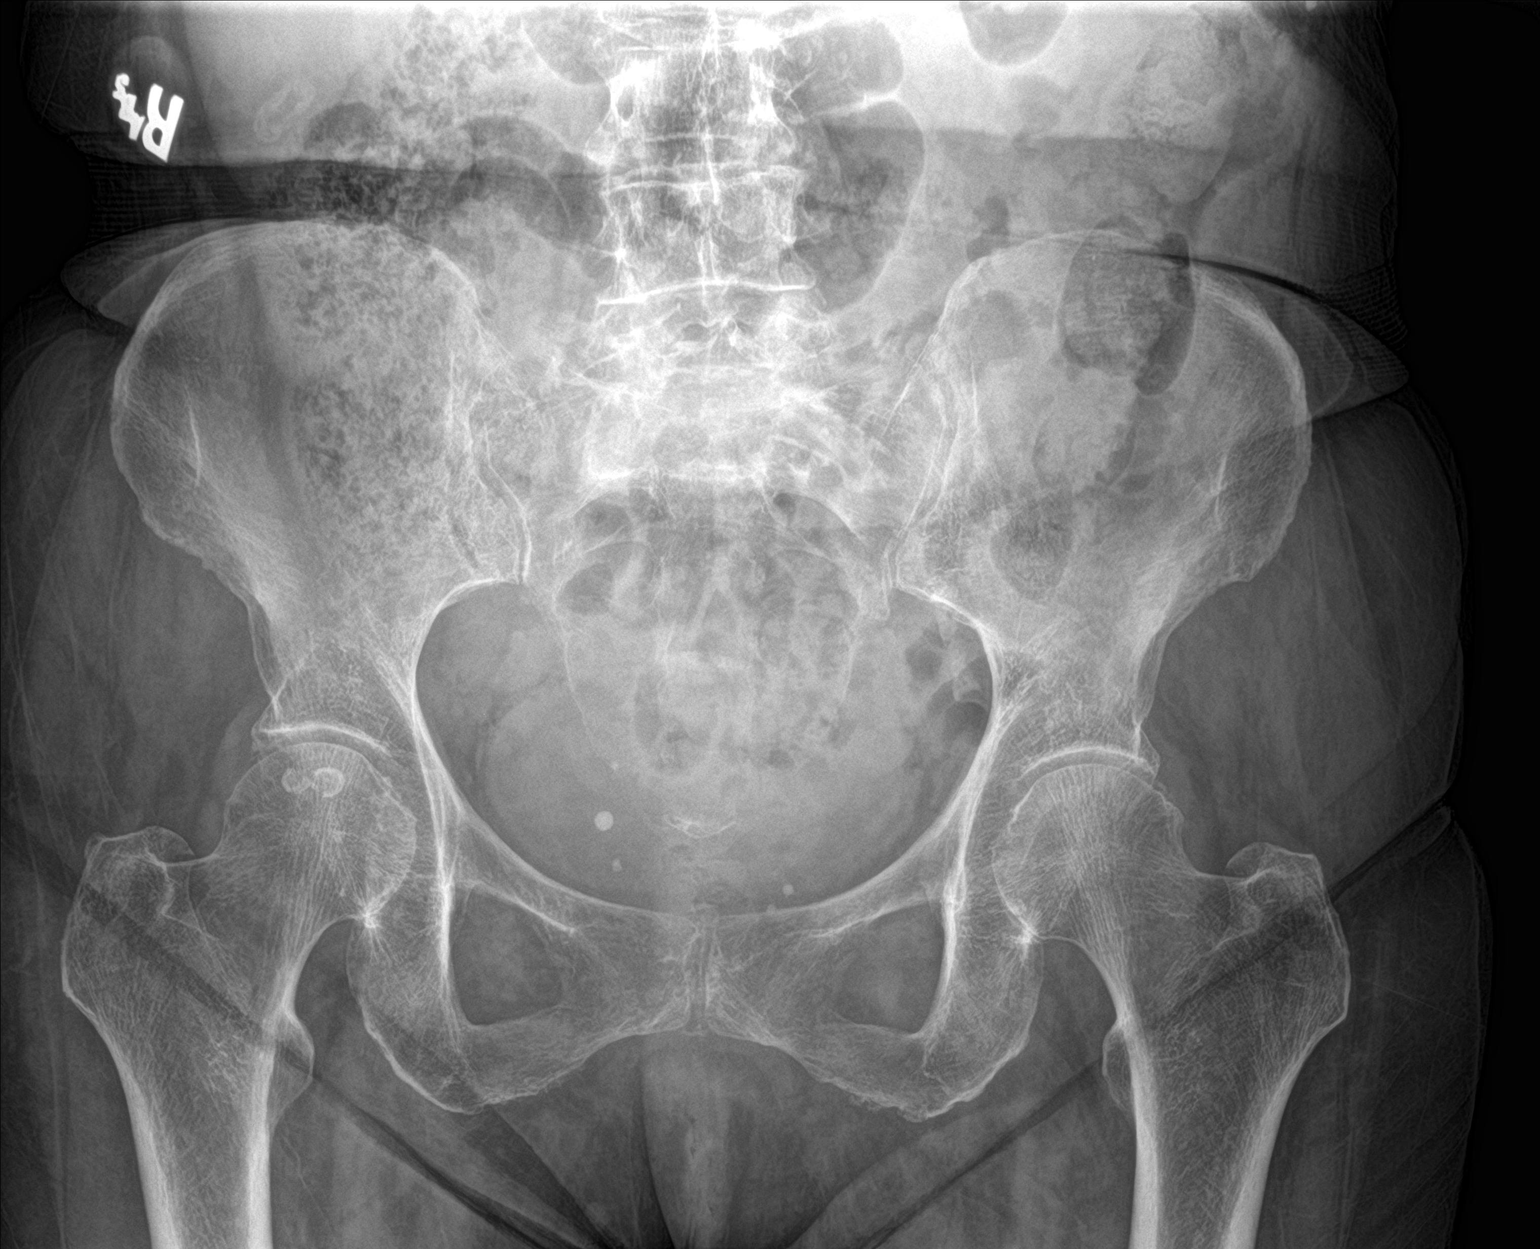

[1 of 1 positions shown; findings below may reference images not displayed]

FINDINGS: No definite displaced pelvic fracture. Suspected mild degenerative
change of the bilateral hips with joint space loss, subchondral
sclerosis and osteophytosis, incompletely evaluated. A minimal
amount of chondrocalcinosis is noted within the pubic symphysis.
Limited visualization the bilateral SI joints is normal.

Several phleboliths overlie the lower pelvis bilaterally. Large
colonic stool burden.
IMPRESSION: No displaced pelvic fracture.

## 2017-08-21 ENCOUNTER — Telehealth: Payer: Self-pay | Admitting: *Deleted

## 2017-08-26 ENCOUNTER — Ambulatory Visit: Payer: Medicare Other | Admitting: Pain Medicine

## 2017-09-01 NOTE — Progress Notes (Signed)
Patient's Name: Brittney Simpson  MRN: [id number]  Referring Provider: Nobie Putnam *  DOB: [birth year]  PCP: Olin Hauser, DO  DOS: 09/02/2017  Note by: Gaspar Cola, MD  Service setting: Ambulatory outpatient  Specialty: Interventional Pain Management  Patient type: Established  Location: [facility] (AMB) Pain Management Facility  Visit type: Interventional Procedure   Primary Reason for Visit: Interventional Pain Management Treatment. CC: Back Pain (lower)  Procedure:  Anesthesia, Analgesia, Anxiolysis:  Type: Diagnostic Medial Branch Facet Block           Region: Lumbar Level: L2, L3, L4, L5, & S1 Medial Branch Level(s) Laterality: Bilateral  Type: Local Anesthesia Local Anesthetic: Lidocaine 1% Route: Infiltration ([place]/IM) IV Access: Declined Sedation: Declined  Indication(s): Analgesia           Indications: 1. Chronic low back pain (Bilateral) (R>L)   2. Lumbar facet syndrome (Bilateral) (R>L)   3. Lumbar spondylosis    Pain Score: Pre-procedure: 9 /10 Post-procedure: 9 /10  Pre-op Assessment:  Brittney Simpson is a 84 y.o. (year old), female patient, seen today for interventional treatment. She  has a past surgical history that includes Hernia repair; Hernia repair; Hemorrhoid surgery; and Kyphoplasty (N/A, 01/23/2017). Brittney Simpson has a current medication list which includes the following prescription(s): acetaminophen, amlodipine, lidocaine, brimonidine, clonazepam, cranberry, vitamin d2, furosemide, latanoprost, levothyroxine, lisinopril, metoprolol succinate, omeprazole, sulfamethoxazole-trimethoprim, timolol, tramadol, and trazodone. Her primarily concern today is the Back Pain (lower)  Initial Vital Signs:  Pulse Rate: (!) 56 Temp: 98.4 F (36.9 C) Resp: 16 BP: (!) 179/78 SpO2: 97 %  BMI: Estimated body mass index is 20.34 kg/m as calculated from the following:   Height as of this encounter: 5\' 6"  (1.676 m).   Weight as of this encounter: 126  lb (57.2 kg).  Risk Assessment: Allergies: Reviewed. She has No Known Allergies.  Allergy Precautions: None required Coagulopathies: Reviewed. None identified.  Blood-thinner therapy: None at this time Active Infection(s): Reviewed. None identified. Brittney Simpson is afebrile  Site Confirmation: Brittney Simpson was asked to confirm the procedure and laterality before marking the site Procedure checklist: Completed Consent: Before the procedure and under the influence of no sedative(s), amnesic(s), or anxiolytics, the patient was informed of the treatment options, risks and possible complications. To fulfill our ethical and legal obligations, as recommended by the American Medical Association's Code of Ethics, I have informed the patient of my clinical impression; the nature and purpose of the treatment or procedure; the risks, benefits, and possible complications of the intervention; the alternatives, including doing nothing; the risk(s) and benefit(s) of the alternative treatment(s) or procedure(s); and the risk(s) and benefit(s) of doing nothing. The patient was provided information about the general risks and possible complications associated with the procedure. These may include, but are not limited to: failure to achieve desired goals, infection, bleeding, organ or nerve damage, allergic reactions, paralysis, and death. In addition, the patient was informed of those risks and complications associated to Spine-related procedures, such as failure to decrease pain; infection (i.e.: Meningitis, epidural or intraspinal abscess); bleeding (i.e.: epidural hematoma, subarachnoid hemorrhage, or any other type of intraspinal or peri-dural bleeding); organ or nerve damage (i.e.: Any type of peripheral nerve, nerve root, or spinal cord injury) with subsequent damage to sensory, motor, and/or autonomic systems, resulting in permanent pain, numbness, and/or weakness of one or several areas of the body; allergic  reactions; (i.e.: anaphylactic reaction); and/or death. Furthermore, the patient was informed of those risks and complications associated with  the medications. These include, but are not limited to: allergic reactions (i.e.: anaphylactic or anaphylactoid reaction(s)); adrenal axis suppression; blood sugar elevation that in diabetics may result in ketoacidosis or comma; water retention that in patients with history of congestive heart failure may result in shortness of breath, pulmonary edema, and decompensation with resultant heart failure; weight gain; swelling or edema; medication-induced neural toxicity; particulate matter embolism and blood vessel occlusion with resultant organ, and/or nervous system infarction; and/or aseptic necrosis of one or more joints. Finally, the patient was informed that Medicine is not an exact science; therefore, there is also the possibility of unforeseen or unpredictable risks and/or possible complications that may result in a catastrophic outcome. The patient indicated having understood very clearly. We have given the patient no guarantees and we have made no promises. Enough time was given to the patient to ask questions, all of which were answered to the patient's satisfaction. Brittney Simpson has indicated that she wanted to continue with the procedure. Attestation: I, the ordering provider, attest that I have discussed with the patient the benefits, risks, side-effects, alternatives, likelihood of achieving goals, and potential problems during recovery for the procedure that I have provided informed consent. Date: 09/02/2017    Pre-Procedure Preparation:  Monitoring: As per clinic protocol. Respiration, ETCO2, SpO2, BP, heart rate and rhythm monitor placed and checked for adequate function Safety Precautions: Patient was assessed for positional comfort and pressure points before starting the procedure. Time-out: I initiated and conducted the "Time-out" before starting the  procedure, as per protocol. The patient was asked to participate by confirming the accuracy of the "Time Out" information. Verification of the correct person, site, and procedure were performed and confirmed by me, the nursing staff, and the patient. "Time-out" conducted as per Joint Commission's Universal Protocol (UP.01.01.01). "Time-out" Date & Time: 09/02/2017; 1158 hrs.  Description of Procedure Process:   Position: Prone Target Area: For Lumbar Facet blocks, the target is the groove formed by the junction of the transverse process and superior articular process. For the L5 dorsal ramus, the target is the notch between superior articular process and sacral ala. For the S1 dorsal ramus, the target is the superior and lateral edge of the posterior S1 Sacral foramen. Approach: Paramedial approach. Area Prepped: Entire Posterior Lumbosacral Region Prepping solution: ChloraPrep (2% chlorhexidine gluconate and 70% isopropyl alcohol) Safety Precautions: Aspiration looking for blood return was conducted prior to all injections. At no point did we inject any substances, as a needle was being advanced. No attempts were made at seeking any paresthesias. Safe injection practices and needle disposal techniques used. Medications properly checked for expiration dates. SDV (single dose vial) medications used. Description of the Procedure: Protocol guidelines were followed. The patient was placed in position over the fluoroscopy table. The target area was identified and the area prepped in the usual manner. Skin desensitized using vapocoolant spray. Skin & deeper tissues infiltrated with local anesthetic. Appropriate amount of time allowed to pass for local anesthetics to take effect. The procedure needle was introduced through the skin, ipsilateral to the reported pain, and advanced to the target area. Employing the "Medial Branch Technique", the needles were advanced to the angle made by the superior and medial  portion of the transverse process, and the lateral and inferior portion of the superior articulating process of the targeted vertebral bodies. This area is known as "Burton's Eye" or the "Eye of the [place] Dog". A procedure needle was introduced through the skin, and this time advanced to  the angle made by the superior and medial border of the sacral ala, and the lateral border of the S1 vertebral body. This last needle was later repositioned at the superior and lateral border of the posterior S1 foramen. Negative aspiration confirmed. Solution injected in intermittent fashion, asking for systemic symptoms every 0.5cc of injectate. The needles were then removed and the area cleansed, making sure to leave some of the prepping solution back to take advantage of its long term bactericidal properties.   Illustration of the posterior view of the lumbar spine and the posterior neural structures. Laminae of L2 through S1 are labeled. DPRL5, dorsal primary ramus of L5; DPRS1, dorsal primary ramus of S1; DPR3, dorsal primary ramus of L3; FJ, facet (zygapophyseal) joint L3-L4; I, inferior articular process of L4; LB1, lateral branch of dorsal primary ramus of L1; IAB, inferior articular branches from L3 medial branch (supplies L4-L5 facet joint); IBP, intermediate branch plexus; MB3, medial branch of dorsal primary ramus of L3; NR3, third lumbar nerve root; S, superior articular process of L5; SAB, superior articular branches from L4 (supplies L4-5 facet joint also); TP3, transverse process of L3.  Vitals:   09/02/17 1156 09/02/17 1200 09/02/17 1205 09/02/17 1207  BP: (!) 190/78 (!) 181/70 (!) 166/78 (!) 169/94  Pulse:      Resp: 19 14 16 14   Temp:      TempSrc:      SpO2: 98% 97% 97% 97%  Weight:      Height:        Start Time: 1158 hrs. End Time: 1207 hrs. Materials:  Needle(s) Type: Regular needle Gauge: 22G Length: 3.5-in Medication(s): We administered lidocaine, ropivacaine (PF) 2 mg/mL (0.2%),  triamcinolone acetonide, ropivacaine (PF) 2 mg/mL (0.2%), and triamcinolone acetonide. Please see chart orders for dosing details.  Imaging Guidance (Spinal):  Type of Imaging Technique: Fluoroscopy Guidance (Spinal) Indication(s): Assistance in needle guidance and placement for procedures requiring needle placement in or near specific anatomical locations not easily accessible without such assistance. Exposure Time: Please see nurses notes. Contrast: None used. Fluoroscopic Guidance: I was personally present during the use of fluoroscopy. "Tunnel Vision Technique" used to obtain the best possible view of the target area. Parallax error corrected before commencing the procedure. "Direction-depth-direction" technique used to introduce the needle under continuous pulsed fluoroscopy. Once target was reached, antero-posterior, oblique, and lateral fluoroscopic projection used confirm needle placement in all planes. Images permanently stored in EMR. Interpretation: No contrast injected. I personally interpreted the imaging intraoperatively. Adequate needle placement confirmed in multiple planes. Permanent images saved into the patient's record.  Antibiotic Prophylaxis:  Indication(s): None identified Antibiotic given: None  Post-operative Assessment:  Post-procedure Vital Signs:  Pulse Rate: (!) 56 Temp: 98.4 F (36.9 C) Resp: 14 BP: (!) 169/94 SpO2: 97 %  EBL: None  Complications: No immediate post-treatment complications observed by team, or reported by patient.  Note: The patient tolerated the entire procedure well. A repeat set of vitals were taken after the procedure and the patient was kept under observation following institutional policy, for this type of procedure. Post-procedural neurological assessment was performed, showing return to baseline, prior to discharge. The patient was provided with post-procedure discharge instructions, including a section on how to identify potential  problems. Should any problems arise concerning this procedure, the patient was given instructions to immediately contact us, at any time, without hesitation. In any case, we plan to contact the patient by telephone for a follow-up status report regarding this interventional procedure.  Comments:  No additional relevant information.  Plan of Care   Imaging Orders     DG C-Arm 1-60 Min-No Report  Procedure Orders     LUMBAR FACET(MEDIAL BRANCH NERVE BLOCK) MBNB  Medications ordered for procedure: Meds ordered this encounter  Medications  . lidocaine (XYLOCAINE) 2 % (with pres) injection 200 mg  . ropivacaine (PF) 2 mg/mL (0.2%) (NAROPIN) injection 9 mL  . triamcinolone acetonide (KENALOG-40) injection 40 mg  . ropivacaine (PF) 2 mg/mL (0.2%) (NAROPIN) injection 9 mL  . triamcinolone acetonide (KENALOG-40) injection 40 mg   Medications administered: We administered lidocaine, ropivacaine (PF) 2 mg/mL (0.2%), triamcinolone acetonide, ropivacaine (PF) 2 mg/mL (0.2%), and triamcinolone acetonide.  See the medical record for exact dosing, route, and time of administration.  New Prescriptions   No medications on file   Disposition: Discharge home  Discharge Date & Time: 09/02/2017; 1215 hrs.   Physician-requested Follow-up: Return for post-procedure eval (2 wks), w/ Dr. Dossie Arbour.  Future Appointments  Date Time Provider [place]  09/17/2017  1:15 PM Milinda Pointer, MD [facility] None  10/28/2017  2:30 PM Vevelyn Francois, NP [facility] None  12/05/2017  2:20 PM [place] Devonne Doughty, DO [place] None  03/10/2018  8:30 AM [facility] None  03/10/2018  9:45 AM Dew, Erskine Squibb, MD [facility] None   Primary Care Physician: Olin Hauser, DO Location: [facility] Note by: Gaspar Cola, MD Date: 09/02/2017; Time: 1:06 PM  Disclaimer:  Medicine is not an Chief Strategy Officer. The only guarantee in medicine is that nothing is guaranteed.  It is important to note that the decision to proceed with this intervention was based on the information collected from the patient. The Data and conclusions were drawn from the patient's questionnaire, the interview, and the physical examination. Because the information was provided in large part by the patient, it cannot be guaranteed that it has not been purposely or unconsciously manipulated. Every effort has been made to obtain as much relevant data as possible for this evaluation. It is important to note that the conclusions that lead to this procedure are derived in large part from the available data. Always take into account that the treatment will also be dependent on availability of resources and existing treatment guidelines, considered by other Pain Management Practitioners as being common knowledge and practice, at the time of the intervention. For Medico-Legal purposes, it is also important to point out that variation in procedural techniques and pharmacological choices are the acceptable norm. The indications, contraindications, technique, and results of the above procedure should only be interpreted and judged by a Board-Certified Interventional Pain Specialist with extensive familiarity and expertise in the same exact procedure and technique.

## 2017-09-02 ENCOUNTER — Ambulatory Visit
Admission: RE | Admit: 2017-09-02 | Discharge: 2017-09-02 | Disposition: A | Discharge status: Home / Self Care | Payer: Medicare Other | Source: Ambulatory Visit | Attending: Pain Medicine | Admitting: Pain Medicine

## 2017-09-02 ENCOUNTER — Encounter: Payer: Self-pay | Admitting: Pain Medicine

## 2017-09-02 ENCOUNTER — Ambulatory Visit (HOSPITAL_BASED_OUTPATIENT_CLINIC_OR_DEPARTMENT_OTHER): Payer: Medicare Other | Admitting: Pain Medicine

## 2017-09-02 ENCOUNTER — Other Ambulatory Visit: Payer: Self-pay

## 2017-09-02 VITALS — BP 169/94 | HR 56 | Temp 98.4°F | Resp 14 | Ht 66.0 in | Wt 126.0 lb

## 2017-09-02 DIAGNOSIS — G8929 Other chronic pain: Secondary | ICD-10-CM | POA: Insufficient documentation

## 2017-09-02 DIAGNOSIS — Z79899 Other long term (current) drug therapy: Secondary | ICD-10-CM | POA: Insufficient documentation

## 2017-09-02 DIAGNOSIS — Z79890 Hormone replacement therapy (postmenopausal): Secondary | ICD-10-CM | POA: Insufficient documentation

## 2017-09-02 DIAGNOSIS — M47816 Spondylosis without myelopathy or radiculopathy, lumbar region: Secondary | ICD-10-CM | POA: Insufficient documentation

## 2017-09-02 DIAGNOSIS — M545 Low back pain, unspecified: Secondary | ICD-10-CM

## 2017-09-02 MED ORDER — ROPIVACAINE HCL 2 MG/ML IJ SOLN
9.0000 mL | Freq: Once | INTRAMUSCULAR | Status: AC
  Administered 2017-09-02: 9 mL via PERINEURAL
  Filled 2017-09-02: qty 10

## 2017-09-02 MED ORDER — TRIAMCINOLONE ACETONIDE 40 MG/ML IJ SUSP
40.0000 mg | Freq: Once | INTRAMUSCULAR | Status: AC
  Administered 2017-09-02: 40 mg
  Filled 2017-09-02: qty 1

## 2017-09-02 MED ORDER — LIDOCAINE HCL 2 % IJ SOLN
10.0000 mL | Freq: Once | INTRAMUSCULAR | Status: AC
  Administered 2017-09-02: 400 mg
  Filled 2017-09-02: qty 40

## 2017-09-02 NOTE — Progress Notes (Signed)
Safety precautions to be maintained throughout the outpatient stay will include: orient to surroundings, keep bed in low position, maintain call bell within reach at all times, provide assistance with transfer out of bed and ambulation.  

## 2017-09-02 NOTE — Patient Instructions (Addendum)
____________________________________________________________________________________________  Post-Procedure instructions Instructions:  Apply ice: Fill a plastic sandwich bag with crushed ice. Cover it with a small towel and apply to injection site. Apply for 15 minutes then remove x 15 minutes. Repeat sequence on day of procedure, until you go to bed. The purpose is to minimize swelling and discomfort after procedure.  Apply heat: Apply heat to procedure site starting the day following the procedure. The purpose is to treat any soreness and discomfort from the procedure.  Food intake: Start with clear liquids (like water) and advance to regular food, as tolerated.   Physical activities: Keep activities to a minimum for the first 8 hours after the procedure.   Driving: If you have received any sedation, you are not allowed to drive for 24 hours after your procedure.  Blood thinner: Restart your blood thinner 6 hours after your procedure. (Only for those taking blood thinners)  Insulin: As soon as you can eat, you may resume your normal dosing schedule. (Only for those taking insulin)  Infection prevention: Keep procedure site clean and dry.  Post-procedure Pain Diary: Extremely important that this be done correctly and accurately. Recorded information will be used to determine the next step in treatment.  Pain evaluated is that of treated area only. Do not include pain from an untreated area.  Complete every hour, on the hour, for the initial 8 hours. Set an alarm to help you do this part accurately.  Do not go to sleep and have it completed later. It will not be accurate.  Follow-up appointment: Keep your follow-up appointment after the procedure. Usually 2 weeks for most procedures. (6 weeks in the case of radiofrequency.) Bring you pain diary.  Expect:  From numbing medicine (AKA: Local Anesthetics): Numbness or decrease in pain.  Onset: Full effect within 15 minutes of  injected.  Duration: It will depend on the type of local anesthetic used. On the average, 1 to 8 hours.   From steroids: Decrease in swelling or inflammation. Once inflammation is improved, relief of the pain will follow.  Onset of benefits: Depends on the amount of swelling present. The more swelling, the longer it will take for the benefits to be seen. In some cases, up to 10 days.  Duration: Steroids will stay in the system x 2 weeks. Duration of benefits will depend on multiple posibilities including persistent irritating factors.  From procedure: Some discomfort is to be expected once the numbing medicine wears off. This should be minimal if ice and heat are applied as instructed. Call if:  You experience numbness and weakness that gets worse with time, as opposed to wearing off.  New onset bowel or bladder incontinence. (Spinal procedures only)  Emergency Numbers:  Durning business hours (Monday - Thursday, 8:00 AM - 4:00 PM) (Friday, 9:00 AM - 12:00 Noon): [phone]  After hours: [phone] ____________________________________________________________________________________________   Pain Management Discharge Instructions  General Discharge Instructions :  If you need to reach your doctor call: Monday-Friday 8:00 am - 4:00 pm at [phone] or toll free [phone].  After clinic hours [phone] to have operator reach doctor.  Bring all of your medication bottles to all your appointments in the pain clinic.  To cancel or reschedule your appointment with Pain Management please remember to call 24 hours in advance to avoid a fee.  Refer to the educational materials which you have been given on: General Risks, I had my Procedure. Discharge Instructions, Post Sedation.  Post Procedure Instructions:  The drugs  you were given will stay in your system until tomorrow, so for the next 24 hours you should not drive, make any legal decisions or drink any alcoholic  beverages.  You may eat anything you prefer, but it is better to start with liquids then soups and crackers, and gradually work up to solid foods.  Please notify your doctor immediately if you have any unusual bleeding, trouble breathing or pain that is not related to your normal pain.  Depending on the type of procedure that was done, some parts of your body may feel week and/or numb.  This usually clears up by tonight or the next day.  Walk with the use of an assistive device or accompanied by an adult for the 24 hours.  You may use ice on the affected area for the first 24 hours.  Put ice in a Ziploc bag and cover with a towel and place against area 15 minutes on 15 minutes off.  You may switch to heat after 24 hours. Facet Joint Block The facet joints connect the bones of the spine (vertebrae). They make it possible for you to bend, twist, and make other movements with your spine. They also keep you from bending too far, twisting too far, and making other excessive movements. A facet joint block is a procedure where a numbing medicine (anesthetic) is injected into a facet joint. Often, a type of anti-inflammatory medicine called a steroid is also injected. A facet joint block may be done to diagnose neck or back pain. If the pain gets better after a facet joint block, it means the pain is probably coming from the facet joint. If the pain does not get better, it means the pain is probably not coming from the facet joint. A facet joint block may also be done to relieve neck or back pain caused by an inflamed facet joint. A facet joint block is only done to relieve pain if the pain does not improve with other methods, such as medicine, exercise programs, and physical therapy. Tell a health care provider about:  Any allergies you have.  All medicines you are taking, including vitamins, herbs, eye drops, creams, and over-the-counter medicines.  Any problems you or family members have had with  anesthetic medicines.  Any blood disorders you have.  Any surgeries you have had.  Any medical conditions you have.  Whether you are pregnant or may be pregnant. What are the risks? Generally, this is a safe procedure. However, problems may occur, including:  Bleeding.  Injury to a nerve near the injection site.  Pain at the injection site.  Weakness or numbness in areas controlled by nerves near the injection site.  Infection.  Temporary fluid retention.  Allergic reactions to medicines or dyes.  Injury to other structures or organs near the injection site.  What happens before the procedure?  Follow instructions from your health care provider about eating or drinking restrictions.  Ask your health care provider about: ? Changing or stopping your regular medicines. This is especially important if you are taking diabetes medicines or blood thinners. ? Taking medicines such as aspirin and ibuprofen. These medicines can thin your blood. Do not take these medicines before your procedure if your health care provider instructs you not to.  Do not take any new dietary supplements or medicines without asking your health care provider first.  Plan to have someone take you home after the procedure. What happens during the procedure?  You may need to remove  your clothing and dress in an open-back gown.  The procedure will be done while you are lying on an X-ray table. You will most likely be asked to lie on your stomach, but you may be asked to lie in a different position if an injection will be made in your neck.  Machines will be used to monitor your oxygen levels, heart rate, and blood pressure.  If an injection will be made in your neck, an IV tube will be inserted into one of your veins. Fluids and medicine will flow directly into your body through the IV tube.  The area over the facet joint where the injection will be made will be cleaned with soap. The surrounding skin  will be covered with clean drapes.  A numbing medicine (local anesthetic) will be applied to your skin. Your skin may sting or burn for a moment.  A video X-ray machine (fluoroscopy) will be used to locate the joint. In some cases, a CT scan may be used.  A contrast dye may be injected into the facet joint area to help locate the joint.  When the joint is located, an anesthetic will be injected into the joint through the needle.  Your health care provider will ask you whether you feel pain relief. If you do feel relief, a steroid may be injected to provide pain relief for a longer period of time. If you do not feel relief or feel only partial relief, additional injections of an anesthetic may be made in other facet joints.  The needle will be removed.  Your skin will be cleaned.  A bandage (dressing) will be applied over each injection site. The procedure may vary among health care providers and hospitals. What happens after the procedure?  You will be observed for 15-30 minutes before being allowed to go home. This information is not intended to replace advice given to you by your health care provider. Make sure you discuss any questions you have with your health care provider. Document Released: 12/04/2006 Document Revised: 08/16/2015 Document Reviewed: 04/10/2015 [employer] Interactive Patient Education  [employer].

## 2017-09-03 ENCOUNTER — Telehealth: Payer: Self-pay

## 2017-09-03 NOTE — Telephone Encounter (Signed)
Daughter states that Brittney Simpson is doing good and stated that she is actually feeling better yesterday. Instructed to call if needed

## 2017-09-16 NOTE — Progress Notes (Signed)
Patient's Name: Brittney Simpson  MRN: [id number]  Referring Provider: Nobie Putnam *  DOB: 29-Apr-1928  PCP: Olin Hauser, DO  DOS: 09/17/2017  Note by: Gaspar Cola, MD  Service setting: Ambulatory outpatient  Specialty: Interventional Pain Management  Location: [facility] (AMB) Pain Management Facility    Patient type: Established   Primary Reason(s) for Visit: Encounter for post-procedure evaluation of chronic illness with mild to moderate exacerbation CC: Back Pain (lower)  HPI  Brittney Simpson is a 84 y.o. year old, female patient, who comes today for a post-procedure evaluation. She has Long term current use of opiate analgesic; Long term prescription opiate use; Opiate use (15 MME/Day); Encounter for therapeutic drug level monitoring; Lumbar facet syndrome (Bilateral) (R>L); Chronic low back pain (Bilateral) (R>L); Spondylolisthesis of lumbosacral region (L2-3 and L5-S1); Lumbar spinal stenosis (9 mm at L3-4); Trochanteric bursitis (Right); Chronic hip pain (Right); Osteoarthritis of hip (Right); Chronic sacroiliac joint pain (Right); Chronic lumbar radicular pain (Right); Chronic lower extremity pain (Right); Myofascial pain; Encounter for chronic pain management; Essential hypertension; CKD (chronic kidney disease) stage 3, GFR 30-59 ml/min ([place]); Skin lesion of scalp; Chronic anxiety; Insomnia; Hypothyroidism; Spondylosis without myelopathy or radiculopathy, lumbosacral region; Recurrent UTI; GERD (gastroesophageal reflux disease); Presbycusis of both ears; Hyperlipidemia; Abnormal glucose; Bilateral hearing loss; Osteoporosis; AAA (abdominal aortic aneurysm) without rupture (HCC); and Ankle edema, bilateral on their problem list. Her primarily concern today is the Back Pain (lower)  Pain Assessment: Location: Lower Back Radiating: denies Onset: More than a month ago Duration: Chronic pain Quality: (severe) Severity: 0-No pain/10 (self-reported pain score)  Note:  Reported level is compatible with observation.                               Timing: Intermittent Modifying factors: heat  Brittney Simpson comes in today for post-procedure evaluation after the treatment done on 09/02/2017.  Further details on both, my assessment(s), as well as the proposed treatment plan, please see below.  Post-Procedure Assessment  09/02/2017 Procedure: Therapeutic bilateral lumbar facet block #4, under fluoroscopic guidance, no sedation Pre-procedure pain score:  9/10 Post-procedure pain score: 9/10 No initial benefit, possible due to rapid discharge after no sedation procedure, without enough time to allow full onset of block. Influential Factors: BMI: 20.34 kg/m Intra-procedural challenges: None observed.         Assessment challenges: None detected.              Reported side-effects: None.        Post-procedural adverse reactions or complications: None reported         Sedation: No sedation used. When no sedatives are used, the analgesic levels obtained are directly associated to the effectiveness of the local anesthetics. However, when sedation is provided, the level of analgesia obtained during the initial 1 hour following the intervention, is believed to be the result of a combination of factors. These factors may include, but are not limited to: 1. The effectiveness of the local anesthetics used. 2. The effects of the analgesic(s) and/or anxiolytic(s) used. 3. The degree of discomfort experienced by the patient at the time of the procedure. 4. The patients ability and reliability in recalling and recording the events. 5. The presence and influence of possible secondary gains and/or psychosocial factors. Reported result: Relief experienced during the 1st hour after the procedure: 90 % (Ultra-Short Term Relief) Brittney Simpson has indicated area to have been numb during this time.  Interpretative annotation: Clinically appropriate result. No IV Analgesic or Anxiolytic  given, therefore benefits are completely due to Local Anesthetic effects.          Effects of local anesthetic: The analgesic effects attained during this period are directly associated to the localized infiltration of local anesthetics and therefore cary significant diagnostic value as to the etiological location, or anatomical origin, of the pain. Expected duration of relief is directly dependent on the pharmacodynamics of the local anesthetic used. Long-acting (4-6 hours) anesthetics used.  Reported result: Relief during the next 4 to 6 hour after the procedure: 90 % (Short-Term Relief) Brittney Simpson has indicated area to have been numb during this time. Interpretative annotation: Clinically appropriate result. Analgesia during this period is likely to be Local Anesthetic-related.          Long-term benefit: Defined as the period of time past the expected duration of local anesthetics (1 hour for short-acting and 4-6 hours for long-acting). With the possible exception of prolonged sympathetic blockade from the local anesthetics, benefits during this period are typically attributed to, or associated with, other factors such as analgesic sensory neuropraxia, antiinflammatory effects, or beneficial biochemical changes provided by agents other than the local anesthetics.  Reported result: Extended relief following procedure: 100 % (Long-Term Relief) Brittney Simpson reports that both, extremity and the axial pain improved with the treatment. Interpretative annotation: Clinically appropriate result. Good relief. No permanent benefit expected. Inflammation plays a part in the etiology to the pain.          Current benefits: Defined as reported results that persistent at this point in time.   Analgesia: 100 % Brittney Simpson reports that both, extremity and the axial pain improved with the treatment. Function: Brittney Simpson reports improvement in function ROM: Brittney Simpson reports improvement in ROM Interpretative  annotation: Complete relief. Therapeutic success. Effective palliative intervention. She has been getting approximately 6 months out of each bilateral lumbar facet block.  Interpretation: Results would suggest a successful therapeutic intervention.                  Plan:  Set up procedure as a PRN palliative treatment option for this patient.                 Laboratory Chemistry  Renal Function Markers Lab Results  Component Value Date   BUN 14 11/22/2016   CREATININE 0.86 11/22/2016   GFRAA 71 11/22/2016   GFRNONAA 61 11/22/2016                 Hepatic Function Markers Lab Results  Component Value Date   AST 11 11/22/2016   ALT 9 11/22/2016   ALBUMIN 3.6 11/22/2016   ALKPHOS 59 11/22/2016   LIPASE 21 06/14/2016                 Electrolytes Lab Results  Component Value Date   NA 136 11/22/2016   K 4.2 11/22/2016   CL 101 11/22/2016   CALCIUM 8.9 11/22/2016   MG 2.1 01/14/2014                        Neuropathy Markers Lab Results  Component Value Date   HGBA1C 5.5 11/22/2016                 Coagulation Parameters Lab Results  Component Value Date   PLT 171 11/22/2016  Cardiovascular Markers Lab Results  Component Value Date   CKTOTAL 65 04/23/2012   CKMB 1.4 04/23/2012   TROPONINI <0.03 11/24/2014   HGB 11.9 11/22/2016   HCT 38.8 11/22/2016                 Note: Lab results reviewed.  Recent Diagnostic Imaging Results  DG C-Arm 1-60 Min-No Report Fluoroscopy was utilized by the requesting physician.  No radiographic  interpretation.   Complexity Note: I personally reviewed the fluoroscopic imaging of the procedure.                        Meds   Current Outpatient Medications:  .  acetaminophen (TYLENOL) 500 MG tablet, Take 1,000 mg by mouth daily as needed for moderate pain or headache., Disp: , Rfl:  .  amLODipine (NORVASC) 5 MG tablet, Take 1 tablet (5 mg total) by mouth daily., Disp: 90 tablet, Rfl: 3 .  ASPERCREME LIDOCAINE  EX, Apply 1 application topically., Disp: , Rfl:  .  brimonidine (ALPHAGAN) 0.2 % ophthalmic solution, Place 1 drop into the left eye 2 (two) times daily. , Disp: , Rfl:  .  clonazePAM (KLONOPIN) 0.5 MG tablet, TAKE 1/2 TABLET BY MOUTH TWICE DAILY FOR ANXIETY., Disp: 30 tablet, Rfl: 5 .  Cranberry 250 MG CAPS, Take 250 mg by mouth daily., Disp: , Rfl:  .  Ergocalciferol (VITAMIN D2) 2000 UNITS TABS, Take 2,000 Units by mouth daily with lunch. , Disp: , Rfl:  .  furosemide (LASIX) 20 MG tablet, Take 1 tablet (20 mg total) by mouth daily., Disp: 30 tablet, Rfl: 12 .  latanoprost (XALATAN) 0.005 % ophthalmic solution, Place 1 drop into the left eye at bedtime. , Disp: , Rfl:  .  levothyroxine (SYNTHROID, LEVOTHROID) 125 MCG tablet, TAKE 1 TABLET BY MOUTH ONCE DAILY ON AN EMPTY STOMACH. WAIT 30 MINUTES BEFORE TAKING OTHER MEDS., Disp: 90 tablet, Rfl: 3 .  lisinopril (PRINIVIL,ZESTRIL) 40 MG tablet, Take 1 tablet (40 mg total) by mouth daily., Disp: 90 tablet, Rfl: 3 .  metoprolol succinate (TOPROL-XL) 100 MG 24 hr tablet, Take 1 tablet (100 mg total) by mouth daily. Take with or immediately following a meal., Disp: 30 tablet, Rfl: 11 .  omeprazole (PRILOSEC) 20 MG capsule, Take 1 capsule (20 mg total) by mouth daily. (Patient taking differently: Take 20 mg by mouth daily with lunch. ), Disp: 30 capsule, Rfl: 11 .  sulfamethoxazole-trimethoprim (BACTRIM) 400-80 MG tablet, Take 1 tablet by mouth daily with supper. , Disp: , Rfl:  .  timolol (TIMOPTIC) 0.5 % ophthalmic solution, Place 1 drop into the left eye 2 (two) times daily. , Disp: , Rfl:  .  traMADol (ULTRAM) 50 MG tablet, Take 1 tablet (50 mg total) by mouth 3 (three) times daily as needed for severe pain., Disp: 90 tablet, Rfl: 5 .  traZODone (DESYREL) 50 MG tablet, Take 1 tablet (50 mg total) by mouth at bedtime., Disp: 30 tablet, Rfl: 11  ROS  Constitutional: Denies any fever or chills Gastrointestinal: No reported hemesis, hematochezia,  vomiting, or acute GI distress Musculoskeletal: Denies any acute onset joint swelling, redness, loss of ROM, or weakness Neurological: No reported episodes of acute onset apraxia, aphasia, dysarthria, agnosia, amnesia, paralysis, loss of coordination, or loss of consciousness  Allergies  Ms. Ehrich has No Known Allergies.  [place]  Drug: Ms. Gortney  reports that she does not use drugs. Alcohol:  reports that she does not drink alcohol. Tobacco:  reports that  has never smoked. she has never used smokeless tobacco. Medical:  has a past medical history of Anxiety, Back ache, Hiatal hernia, Hyperlipidemia, Hypertension, Hypothyroid, Osteopenia, Thyroid disease, and UTI (urinary tract infection). Surgical: Ms. Senk  has a past surgical history that includes Hernia repair; Hernia repair; Hemorrhoid surgery; and Kyphoplasty (N/A, 01/23/2017). Family: family history includes Cancer in her father; Stroke in her mother.  Constitutional Exam  General appearance: Well nourished, well developed, and well hydrated. In no apparent acute distress Vitals:   09/17/17 1317  BP: (!) 154/53  Pulse: (!) 56  Resp: 18  Temp: 98.4 F (36.9 C)  TempSrc: Oral  SpO2: 98%  Weight: 126 lb (57.2 kg)  Height: 5\' 6"  (1.676 m)   BMI Assessment: Estimated body mass index is 20.34 kg/m as calculated from the following:   Height as of this encounter: 5\' 6"  (1.676 m).   Weight as of this encounter: 126 lb (57.2 kg).  BMI interpretation table: BMI level Category Range association with higher incidence of chronic pain  <18 kg/m2 Underweight   18.5-24.9 kg/m2 Ideal body weight   25-29.9 kg/m2 Overweight Increased incidence by 20%  30-34.9 kg/m2 Obese (Class I) Increased incidence by 68%  35-39.9 kg/m2 Severe obesity (Class II) Increased incidence by 136%  >40 kg/m2 Extreme obesity (Class III) Increased incidence by 254%   BMI Readings from Last 4 Encounters:  09/17/17 20.34 kg/m  09/02/17 20.34 kg/m   05/28/17 20.30 kg/m  05/02/17 20.66 kg/m   Wt Readings from Last 4 Encounters:  09/17/17 126 lb (57.2 kg)  09/02/17 126 lb (57.2 kg)  05/28/17 125 lb 12.8 oz (57.1 kg)  05/02/17 128 lb (58.1 kg)  Psych/Mental status: Alert, oriented x 3 (person, place, & time)       Eyes: PERLA Respiratory: No evidence of acute respiratory distress  Cervical Spine Area Exam  Skin & Axial Inspection: No masses, redness, edema, swelling, or associated skin lesions Alignment: Symmetrical Functional ROM: Unrestricted ROM      Stability: No instability detected Muscle Tone/Strength: Functionally intact. No obvious neuro-muscular anomalies detected. Sensory (Neurological): Unimpaired Palpation: No palpable anomalies              Upper Extremity (UE) Exam    Side: Right upper extremity  Side: Left upper extremity  Skin & Extremity Inspection: Skin color, temperature, and hair growth are WNL. No peripheral edema or cyanosis. No masses, redness, swelling, asymmetry, or associated skin lesions. No contractures.  Skin & Extremity Inspection: Skin color, temperature, and hair growth are WNL. No peripheral edema or cyanosis. No masses, redness, swelling, asymmetry, or associated skin lesions. No contractures.  Functional ROM: Unrestricted ROM          Functional ROM: Unrestricted ROM          Muscle Tone/Strength: Functionally intact. No obvious neuro-muscular anomalies detected.  Muscle Tone/Strength: Functionally intact. No obvious neuro-muscular anomalies detected.  Sensory (Neurological): Unimpaired          Sensory (Neurological): Unimpaired          Palpation: No palpable anomalies              Palpation: No palpable anomalies              Specialized Test(s): Deferred         Specialized Test(s): Deferred          Thoracic Spine Area Exam  Skin & Axial Inspection: No masses, redness, or swelling Alignment: Symmetrical Functional ROM:  Unrestricted ROM Stability: No instability detected Muscle  Tone/Strength: Functionally intact. No obvious neuro-muscular anomalies detected. Sensory (Neurological): Unimpaired Muscle strength & Tone: No palpable anomalies  Lumbar Spine Area Exam  Skin & Axial Inspection: No masses, redness, or swelling Alignment: Symmetrical Functional ROM: Improved after treatment      Stability: No instability detected Muscle Tone/Strength: Functionally intact. No obvious neuro-muscular anomalies detected. Sensory (Neurological): Improved Palpation: No palpable anomalies       Provocative Tests: Lumbar Hyperextension and rotation test: Improved after treatment       Lumbar Lateral bending test: evaluation deferred today       Patrick's Maneuver: evaluation deferred today                    Gait & Posture Assessment  Ambulation: Unassisted Gait: Relatively normal for age and body habitus Posture: WNL   Lower Extremity Exam    Side: Right lower extremity  Side: Left lower extremity  Skin & Extremity Inspection: Skin color, temperature, and hair growth are WNL. No peripheral edema or cyanosis. No masses, redness, swelling, asymmetry, or associated skin lesions. No contractures.  Skin & Extremity Inspection: Skin color, temperature, and hair growth are WNL. No peripheral edema or cyanosis. No masses, redness, swelling, asymmetry, or associated skin lesions. No contractures.  Functional ROM: Unrestricted ROM          Functional ROM: Unrestricted ROM          Muscle Tone/Strength: Functionally intact. No obvious neuro-muscular anomalies detected.  Muscle Tone/Strength: Functionally intact. No obvious neuro-muscular anomalies detected.  Sensory (Neurological): Unimpaired  Sensory (Neurological): Unimpaired  Palpation: No palpable anomalies  Palpation: No palpable anomalies   Assessment  Primary Diagnosis & Pertinent Problem List: The primary encounter diagnosis was Chronic low back pain (Bilateral) (R>L). Diagnoses of Chronic lower extremity pain (Right), Chronic  hip pain (Right), and Spondylosis without myelopathy or radiculopathy, lumbosacral region were also pertinent to this visit.  Status Diagnosis  Resolved Resolved Resolved 1. Chronic low back pain (Bilateral) (R>L)   2. Chronic lower extremity pain (Right)   3. Chronic hip pain (Right)   4. Spondylosis without myelopathy or radiculopathy, lumbosacral region     Problems updated and reviewed during this visit: No problems updated. Plan of Care  Pharmacotherapy (Medications Ordered): No orders of the defined types were placed in this encounter.  Medications administered today: [name] had no medications administered during this visit.   Procedure Orders     LUMBAR FACET(MEDIAL BRANCH NERVE BLOCK) [facility] Lab Orders  No laboratory test(s) ordered today   Imaging Orders  No imaging studies ordered today   Referral Orders  No referral(s) requested today    Interventional management options: Planned, scheduled, and/or pending:   PRN Palliativebilateral lumbar facet block    Considering:   Palliativebilateral lumbar facet block  Possible bilateral lumbar facet radiofrequency ablation.  Diagnostic right-sided intra-articular hip joint injection Palliative right-sided L3-4 lumbar epidural steroid injection Diagnostic right-sided transforaminal L3-4 epidural steroid injection Diagnostic right-sided trochanteric bursa injection Diagnostic right-sided sacroiliac joint block   Palliative PRN treatment(s):   Palliativebilateral lumbar facet block #5    Provider-requested follow-up: Return for PRN Procedure: (B) L-FCT Blk.  Future Appointments  Date Time Provider [place]  10/28/2017  2:30 PM Vevelyn Francois, NP [facility] None  12/05/2017  2:20 PM Olin Hauser, DO [place] None  03/10/2018  8:30 AM [facility] None  03/10/2018  9:45 AM Dew, Erskine Squibb, MD  [facility] None   Primary Care Physician: Olin Hauser, DO Location:  [facility] Note by: Gaspar Cola, MD Date: 09/17/2017; Time: 1:37 PM

## 2017-09-17 ENCOUNTER — Ambulatory Visit: Payer: Medicare Other | Attending: Pain Medicine | Admitting: Pain Medicine

## 2017-09-17 ENCOUNTER — Encounter: Payer: Self-pay | Admitting: Pain Medicine

## 2017-09-17 ENCOUNTER — Other Ambulatory Visit: Payer: Self-pay

## 2017-09-17 VITALS — BP 154/53 | HR 56 | Temp 98.4°F | Resp 18 | Ht 66.0 in | Wt 126.0 lb

## 2017-09-17 DIAGNOSIS — M79604 Pain in right leg: Secondary | ICD-10-CM | POA: Insufficient documentation

## 2017-09-17 DIAGNOSIS — E039 Hypothyroidism, unspecified: Secondary | ICD-10-CM | POA: Insufficient documentation

## 2017-09-17 DIAGNOSIS — M47817 Spondylosis without myelopathy or radiculopathy, lumbosacral region: Secondary | ICD-10-CM | POA: Insufficient documentation

## 2017-09-17 DIAGNOSIS — G8929 Other chronic pain: Secondary | ICD-10-CM | POA: Insufficient documentation

## 2017-09-17 DIAGNOSIS — I714 Abdominal aortic aneurysm, without rupture: Secondary | ICD-10-CM | POA: Diagnosis not present

## 2017-09-17 DIAGNOSIS — E785 Hyperlipidemia, unspecified: Secondary | ICD-10-CM | POA: Insufficient documentation

## 2017-09-17 DIAGNOSIS — N183 Chronic kidney disease, stage 3 (moderate): Secondary | ICD-10-CM | POA: Diagnosis not present

## 2017-09-17 DIAGNOSIS — I129 Hypertensive chronic kidney disease with stage 1 through stage 4 chronic kidney disease, or unspecified chronic kidney disease: Secondary | ICD-10-CM | POA: Diagnosis not present

## 2017-09-17 DIAGNOSIS — M5416 Radiculopathy, lumbar region: Secondary | ICD-10-CM | POA: Diagnosis not present

## 2017-09-17 DIAGNOSIS — M4317 Spondylolisthesis, lumbosacral region: Secondary | ICD-10-CM | POA: Insufficient documentation

## 2017-09-17 DIAGNOSIS — M7061 Trochanteric bursitis, right hip: Secondary | ICD-10-CM | POA: Diagnosis not present

## 2017-09-17 DIAGNOSIS — K219 Gastro-esophageal reflux disease without esophagitis: Secondary | ICD-10-CM | POA: Diagnosis not present

## 2017-09-17 DIAGNOSIS — M25551 Pain in right hip: Secondary | ICD-10-CM | POA: Insufficient documentation

## 2017-09-17 DIAGNOSIS — M48061 Spinal stenosis, lumbar region without neurogenic claudication: Secondary | ICD-10-CM | POA: Diagnosis not present

## 2017-09-17 DIAGNOSIS — G4700 Insomnia, unspecified: Secondary | ICD-10-CM | POA: Insufficient documentation

## 2017-09-17 DIAGNOSIS — Z79899 Other long term (current) drug therapy: Secondary | ICD-10-CM | POA: Insufficient documentation

## 2017-09-17 DIAGNOSIS — F419 Anxiety disorder, unspecified: Secondary | ICD-10-CM | POA: Insufficient documentation

## 2017-09-17 DIAGNOSIS — Z79891 Long term (current) use of opiate analgesic: Secondary | ICD-10-CM | POA: Insufficient documentation

## 2017-09-17 DIAGNOSIS — M545 Low back pain: Secondary | ICD-10-CM | POA: Diagnosis not present

## 2017-09-17 DIAGNOSIS — M533 Sacrococcygeal disorders, not elsewhere classified: Secondary | ICD-10-CM | POA: Insufficient documentation

## 2017-09-17 NOTE — Patient Instructions (Signed)
____________________________________________________________________________________________  Preparing for your procedure (without sedation) Instructions: . Oral Intake: Do not eat or drink anything for at least 3 hours prior to your procedure. . Transportation: Unless otherwise stated by your physician, you may drive yourself after the procedure. . Blood Pressure Medicine: Take your blood pressure medicine with a sip of water the morning of the procedure. . Blood thinners:  . Diabetics on insulin: Notify the staff so that you can be scheduled 1st case in the morning. If your diabetes requires high dose insulin, take only  of your normal insulin dose the morning of the procedure and notify the staff that you have done so. . Preventing infections: Shower with an antibacterial soap the morning of your procedure.  . Build-up your immune system: Take 1000 mg of Vitamin C with every meal (3 times a day) the day prior to your procedure. . Antibiotics: Inform the staff if you have a condition or reason that requires you to take antibiotics before dental procedures. . Pregnancy: If you are pregnant, call and cancel the procedure. . Sickness: If you have a cold, fever, or any active infections, call and cancel the procedure. . Arrival: You must be in the facility at least 30 minutes prior to your scheduled procedure. . Children: Do not bring any children with you. . Dress appropriately: Bring dark clothing that you would not mind if they get stained. . Valuables: Do not bring any jewelry or valuables. Procedure appointments are reserved for interventional treatments only. . No Prescription Refills. . No medication changes will be discussed during procedure appointments. . No disability issues will be discussed. ____________________________________________________________________________________________   

## 2017-09-17 NOTE — Progress Notes (Signed)
Safety precautions to be maintained throughout the outpatient stay will include: orient to surroundings, keep bed in low position, maintain call bell within reach at all times, provide assistance with transfer out of bed and ambulation.  

## 2017-10-01 ENCOUNTER — Other Ambulatory Visit: Payer: Self-pay | Admitting: Family Medicine

## 2017-10-01 DIAGNOSIS — K219 Gastro-esophageal reflux disease without esophagitis: Secondary | ICD-10-CM

## 2017-10-03 ENCOUNTER — Telehealth: Payer: Self-pay | Admitting: Family Medicine

## 2017-10-03 ENCOUNTER — Other Ambulatory Visit: Payer: Self-pay | Admitting: Family Medicine

## 2017-10-03 DIAGNOSIS — F419 Anxiety disorder, unspecified: Secondary | ICD-10-CM

## 2017-10-03 MED ORDER — CLONAZEPAM 0.5 MG PO TABS: ORAL_TABLET | ORAL | 5 refills | Status: DC

## 2017-10-03 NOTE — Telephone Encounter (Signed)
Pt needs a refill on clonazepam sent to [facility]. °

## 2017-10-03 NOTE — Telephone Encounter (Signed)
Checked [place] CSRS PMP Aware, appropriate rx Refilled Clonazepam  Review at upcoming apt in 11/2017  Nobie Putnam, [facility] 10/03/2017, 5:19 PM

## 2017-10-23 ENCOUNTER — Other Ambulatory Visit: Payer: Self-pay | Admitting: Family Medicine

## 2017-10-23 DIAGNOSIS — F5101 Primary insomnia: Secondary | ICD-10-CM

## 2017-10-24 ENCOUNTER — Other Ambulatory Visit: Payer: Self-pay | Admitting: Family Medicine

## 2017-10-24 DIAGNOSIS — I10 Essential (primary) hypertension: Secondary | ICD-10-CM

## 2017-10-28 ENCOUNTER — Ambulatory Visit: Payer: Medicare Other | Attending: Nurse Practitioner | Admitting: Nurse Practitioner

## 2017-10-28 ENCOUNTER — Encounter: Payer: Self-pay | Admitting: Nurse Practitioner

## 2017-10-28 ENCOUNTER — Other Ambulatory Visit: Payer: Self-pay

## 2017-10-28 VITALS — BP 179/67 | HR 60 | Temp 98.4°F | Resp 16 | Ht 66.0 in | Wt 125.0 lb

## 2017-10-28 DIAGNOSIS — G4700 Insomnia, unspecified: Secondary | ICD-10-CM | POA: Diagnosis not present

## 2017-10-28 DIAGNOSIS — M7918 Myalgia, other site: Secondary | ICD-10-CM | POA: Diagnosis not present

## 2017-10-28 DIAGNOSIS — G8929 Other chronic pain: Secondary | ICD-10-CM

## 2017-10-28 DIAGNOSIS — M48061 Spinal stenosis, lumbar region without neurogenic claudication: Secondary | ICD-10-CM | POA: Diagnosis not present

## 2017-10-28 DIAGNOSIS — Z823 Family history of stroke: Secondary | ICD-10-CM | POA: Insufficient documentation

## 2017-10-28 DIAGNOSIS — E785 Hyperlipidemia, unspecified: Secondary | ICD-10-CM | POA: Insufficient documentation

## 2017-10-28 DIAGNOSIS — E039 Hypothyroidism, unspecified: Secondary | ICD-10-CM | POA: Insufficient documentation

## 2017-10-28 DIAGNOSIS — M533 Sacrococcygeal disorders, not elsewhere classified: Secondary | ICD-10-CM | POA: Insufficient documentation

## 2017-10-28 DIAGNOSIS — Z79891 Long term (current) use of opiate analgesic: Secondary | ICD-10-CM | POA: Insufficient documentation

## 2017-10-28 DIAGNOSIS — Z79899 Other long term (current) drug therapy: Secondary | ICD-10-CM | POA: Diagnosis not present

## 2017-10-28 DIAGNOSIS — K219 Gastro-esophageal reflux disease without esophagitis: Secondary | ICD-10-CM | POA: Insufficient documentation

## 2017-10-28 DIAGNOSIS — K449 Diaphragmatic hernia without obstruction or gangrene: Secondary | ICD-10-CM | POA: Insufficient documentation

## 2017-10-28 DIAGNOSIS — N183 Chronic kidney disease, stage 3 (moderate): Secondary | ICD-10-CM | POA: Diagnosis not present

## 2017-10-28 DIAGNOSIS — R7309 Other abnormal glucose: Secondary | ICD-10-CM | POA: Diagnosis not present

## 2017-10-28 DIAGNOSIS — M47819 Spondylosis without myelopathy or radiculopathy, site unspecified: Secondary | ICD-10-CM | POA: Diagnosis not present

## 2017-10-28 DIAGNOSIS — Z87440 Personal history of urinary (tract) infections: Secondary | ICD-10-CM | POA: Insufficient documentation

## 2017-10-28 DIAGNOSIS — M47816 Spondylosis without myelopathy or radiculopathy, lumbar region: Secondary | ICD-10-CM

## 2017-10-28 DIAGNOSIS — I129 Hypertensive chronic kidney disease with stage 1 through stage 4 chronic kidney disease, or unspecified chronic kidney disease: Secondary | ICD-10-CM | POA: Diagnosis not present

## 2017-10-28 DIAGNOSIS — M1610 Unilateral primary osteoarthritis, unspecified hip: Secondary | ICD-10-CM | POA: Insufficient documentation

## 2017-10-28 DIAGNOSIS — G894 Chronic pain syndrome: Secondary | ICD-10-CM | POA: Diagnosis not present

## 2017-10-28 DIAGNOSIS — M47817 Spondylosis without myelopathy or radiculopathy, lumbosacral region: Secondary | ICD-10-CM | POA: Diagnosis not present

## 2017-10-28 MED ORDER — TRAMADOL HCL 50 MG PO TABS: 50.0000 mg | ORAL_TABLET | Freq: Three times a day (TID) | ORAL | 5 refills | Status: DC | PRN

## 2017-10-28 NOTE — Patient Instructions (Signed)
____________________________________________________________________________________________  Medication Rules  Applies to: All patients receiving prescriptions (written or electronic).  Pharmacy of record: Pharmacy where electronic prescriptions will be sent. If written prescriptions are taken to a different pharmacy, please inform the nursing staff. The pharmacy listed in the electronic medical record should be the one where you would like electronic prescriptions to be sent.  Prescription refills: Only during scheduled appointments. Applies to both, written and electronic prescriptions.  NOTE: The following applies primarily to controlled substances (Opioid* Pain Medications).   Patient's responsibilities: 1. Pain Pills: Bring all pain pills to every appointment (except for procedure appointments). 2. Pill Bottles: Bring pills in original pharmacy bottle. Always bring newest bottle. Bring bottle, even if empty. 3. Medication refills: You are responsible for knowing and keeping track of what medications you need refilled. The day before your appointment, write a list of all prescriptions that need to be refilled. Bring that list to your appointment and give it to the admitting nurse. Prescriptions will be written only during appointments. If you forget a medication, it will not be "Called in", "Faxed", or "electronically sent". You will need to get another appointment to get these prescribed. 4. Prescription Accuracy: You are responsible for carefully inspecting your prescriptions before leaving our office. Have the discharge nurse carefully go over each prescription with you, before taking them home. Make sure that your name is accurately spelled, that your address is correct. Check the name and dose of your medication to make sure it is accurate. Check the number of pills, and the written instructions to make sure they are clear and accurate. Make sure that you are given enough medication to last  until your next medication refill appointment. 5. Taking Medication: Take medication as prescribed. Never take more pills than instructed. Never take medication more frequently than prescribed. Taking less pills or less frequently is permitted and encouraged, when it comes to controlled substances (written prescriptions).  6. Inform other Doctors: Always inform, all of your healthcare providers, of all the medications you take. 7. Pain Medication from other Providers: You are not allowed to accept any additional pain medication from any other Doctor or Healthcare provider. There are two exceptions to this rule. (see below) In the event that you require additional pain medication, you are responsible for notifying us, as stated below. 8. Medication Agreement: You are responsible for carefully reading and following our Medication Agreement. This must be signed before receiving any prescriptions from our practice. Safely store a copy of your signed Agreement. Violations to the Agreement will result in no further prescriptions. (Additional copies of our Medication Agreement are available upon request.) 9. Laws, Rules, & Regulations: All patients are expected to follow all Federal and State Laws, Statutes, Rules, & Regulations. Ignorance of the Laws does not constitute a valid excuse. The use of any illegal substances is prohibited. 10. Adopted CDC guidelines & recommendations: Target dosing levels will be at or below 60 MME/day. Use of benzodiazepines** is not recommended.  Exceptions: There are only two exceptions to the rule of not receiving pain medications from other Healthcare Providers. 1. Exception #1 (Emergencies): In the event of an emergency (i.e.: accident requiring emergency care), you are allowed to receive additional pain medication. However, you are responsible for: As soon as you are able, call our office [phone], at any time of the day or night, and leave a message stating your name, the  date and nature of the emergency, and the name and dose of the medication   prescribed. In the event that your call is answered by a member of our staff, make sure to document and save the date, time, and the name of the person that took your information.  2. Exception #2 (Planned Surgery): In the event that you are scheduled by another doctor or dentist to have any type of surgery or procedure, you are allowed (for a period no longer than 30 days), to receive additional pain medication, for the acute post-op pain. However, in this case, you are responsible for picking up a copy of our "Post-op Pain Management for Surgeons" handout, and giving it to your surgeon or dentist. This document is available at our office, and does not require an appointment to obtain it. Simply go to our office during business hours (Monday-Thursday from 8:00 AM to 4:00 PM) (Friday 8:00 AM to 12:00 Noon) or if you have a scheduled appointment with us, prior to your surgery, and ask for it by name. In addition, you will need to provide us with your name, name of your surgeon, type of surgery, and date of procedure or surgery.  *Opioid medications include: morphine, codeine, oxycodone, oxymorphone, hydrocodone, hydromorphone, meperidine, tramadol, tapentadol, buprenorphine, fentanyl, methadone. **Benzodiazepine medications include: diazepam (Valium), alprazolam (Xanax), clonazepam (Klonopine), lorazepam (Ativan), clorazepate (Tranxene), chlordiazepoxide (Librium), estazolam (Prosom), oxazepam (Serax), temazepam (Restoril), triazolam (Halcion) (Last updated: 09/25/2017) ____________________________________________________________________________________________    

## 2017-10-28 NOTE — Progress Notes (Signed)
Nursing Pain Medication Assessment:  Safety precautions to be maintained throughout the outpatient stay will include: orient to surroundings, keep bed in low position, maintain call bell within reach at all times, provide assistance with transfer out of bed and ambulation.  Medication Inspection Compliance: Pill count conducted under aseptic conditions, in front of the patient. Neither the pills nor the bottle was removed from the patient's sight at any time. Once count was completed pills were immediately returned to the patient in their original bottle.  Medication: Tramadol (Ultram) Pill/Patch Count: 79 of 90 pills remain Pill/Patch Appearance: Markings consistent with prescribed medication Bottle Appearance: Standard pharmacy container. Clearly labeled. Filled Date: 03/29 / 2019 Last Medication intake:  Today

## 2017-10-28 NOTE — Progress Notes (Signed)
Patient's Name: Brittney Simpson  MRN: [id number]  Referring Provider: Nobie Putnam *  DOB: Apr 03, 1928  PCP: Olin Hauser, DO  DOS: 10/28/2017  Note by: Vevelyn Francois NP  Service setting: Ambulatory outpatient  Specialty: Interventional Pain Management  Location: [facility] (AMB) Pain Management Facility    Patient type: Established    Primary Reason(s) for Visit: Encounter for prescription drug management. (Level of risk: moderate)  CC: Back Pain (lower)  HPI  Brittney Simpson is a 84 y.o. year old, female patient, who comes today for a medication management evaluation. She has Long term current use of opiate analgesic; Long term prescription opiate use; Opiate use (15 MME/Day); Encounter for therapeutic drug level monitoring; Lumbar facet syndrome (Bilateral) (R>L); Chronic low back pain (Bilateral) (R>L); Spondylolisthesis of lumbosacral region (L2-3 and L5-S1); Lumbar spinal stenosis (9 mm at L3-4); Trochanteric bursitis (Right); Chronic hip pain (Right); Osteoarthritis of hip (Right); Chronic sacroiliac joint pain (Right); Chronic lumbar radicular pain (Right); Chronic lower extremity pain (Right); Myofascial pain; Encounter for chronic pain management; Essential hypertension; CKD (chronic kidney disease) stage 3, GFR 30-59 ml/min ([place]); Skin lesion of scalp; Chronic anxiety; Insomnia; Hypothyroidism; Spondylosis without myelopathy or radiculopathy, lumbosacral region; Recurrent UTI; GERD (gastroesophageal reflux disease); Presbycusis of both ears; Hyperlipidemia; Abnormal glucose; Bilateral hearing loss; Osteoporosis; AAA (abdominal aortic aneurysm) without rupture (HCC); and Ankle edema, bilateral on their problem list. Her primarily concern today is the Back Pain (lower)  Pain Assessment: Location: Lower Back Radiating: denies Onset: More than a month ago Duration: Chronic pain Quality: (bad, terrible) Severity: 0-No pain/10 (self-reported pain score)  Note: Reported level is  compatible with observation.                          Timing: Intermittent Modifying factors: heat  Brittney Simpson was last scheduled for an appointment on 05/28/2017 for medication management. During today's appointment we reviewed Brittney Simpson's chronic pain status, as well as her outpatient medication regimen. She continues to have good relief with the bilateral lumbar facet nerve block. She denies any side effects of her mediaiton and she deneis any concerns today.   The patient  reports that she does not use drugs. Her body mass index is 20.18 kg/m.  Further details on both, my assessment(s), as well as the proposed treatment plan, please see below.  Controlled Substance Pharmacotherapy Assessment REMS (Risk Evaluation and Mitigation Strategy)  Analgesic:Tramadol 50 mg 1 tablet by mouth every 8 hours (150 mg/day of tramadol) MME/day:15 mg/day.   [name], RN  10/28/2017  2:30 PM  Sign at close encounter Nursing Pain Medication Assessment:  Safety precautions to be maintained throughout the outpatient stay will include: orient to surroundings, keep bed in low position, maintain call bell within reach at all times, provide assistance with transfer out of bed and ambulation.  Medication Inspection Compliance: Pill count conducted under aseptic conditions, in front of the patient. Neither the pills nor the bottle was removed from the patient's sight at any time. Once count was completed pills were immediately returned to the patient in their original bottle.  Medication: Tramadol (Ultram) Pill/Patch Count: 79 of 90 pills remain Pill/Patch Appearance: Markings consistent with prescribed medication Bottle Appearance: Standard pharmacy container. Clearly labeled. Filled Date: 03/29 / 2019 Last Medication intake:  Today   Pharmacokinetics: Liberation and absorption (onset of action): WNL Distribution (time to peak effect): WNL Metabolism and excretion (duration of action): WNL  Pharmacodynamics: Desired effects: Analgesia: Brittney Simpson reports >50% benefit. Functional ability: Patient reports that medication allows her to accomplish basic ADLs Clinically meaningful improvement in function (CMIF): Sustained CMIF goals met Perceived effectiveness: Described as relatively effective, allowing for increase in activities of daily living (ADL) Undesirable effects: Side-effects or Adverse reactions: None reported Monitoring: [place] PMP: Online review of the past 36-monthperiod conducted. Compliant with practice rules and regulations Last UDS on record: No results found for: SUMMARY UDS interpretation: Compliant          Medication Assessment Form: Reviewed. Patient indicates being compliant with therapy Treatment compliance: Compliant Risk Assessment Profile: Aberrant behavior: See prior evaluations. None observed or detected today Comorbid factors increasing risk of overdose: See prior notes. No additional risks detected today Risk of substance use disorder (SUD): Low  ORT Scoring interpretation table:  Score <3 = Low Risk for SUD  Score between 4-7 = Moderate Risk for SUD  Score >8 = High Risk for Opioid Abuse   Risk Mitigation Strategies:  Patient Counseling: Covered Patient-Prescriber Agreement (PPA): Present and active  Notification to other healthcare providers: Done  Pharmacologic Plan: No change in therapy, at this time.             Laboratory Chemistry  Inflammation Markers (CRP: Acute Phase) (ESR: Chronic Phase) No results found for: CRP, ESRSEDRATE, LATICACIDVEN                       Rheumatology Markers No results found for: [facility]ne [facility]in [facility]                     Renal Function Markers Lab Results  Component Value Date   BUN 14 11/22/2016   CREATININE 0.86 11/22/2016   GFRAA 71 11/22/2016   GFRNONAA 61 11/22/2016                              Hepatic Function Markers Lab Results  Component Value Date    AST 11 11/22/2016   ALT 9 11/22/2016   ALBUMIN 3.6 11/22/2016   ALKPHOS 59 11/22/2016   LIPASE 21 06/14/2016                        Electrolytes Lab Results  Component Value Date   NA 136 11/22/2016   K 4.2 11/22/2016   CL 101 11/22/2016   CALCIUM 8.9 11/22/2016   MG 2.1 01/14/2014                        Neuropathy Markers Lab Results  Component Value Date   HGBA1C 5.5 11/22/2016                        Bone Pathology Markers No results found for: VO[place] [id number] 25OHVITD1, 25OHVITD2, 25OHVITD3, TESTOFREE, TESTOSTERONE                       Coagulation Parameters Lab Results  Component Value Date   PLT 171 11/22/2016                        Cardiovascular Markers Lab Results  Component Value Date   CKTOTAL 65 04/23/2012   CKMB 1.4 04/23/2012   TROPONINI <0.03 11/24/2014   HGB 11.9 11/22/2016   HCT 38.8  11/22/2016                         CA Markers No results found for: CEA, CA125, LABCA2                      Note: Lab results reviewed.  Recent Diagnostic Imaging Results  DG C-Arm 1-60 Min-No Report Fluoroscopy was utilized by the requesting physician.  No radiographic  interpretation.   Complexity Note: Imaging results reviewed. Results shared with Brittney Simpson, using Layman's terms.                         Meds   Current Outpatient Medications:  .  acetaminophen (TYLENOL) 500 MG tablet, Take 1,000 mg by mouth daily as needed for moderate pain or headache., Disp: , Rfl:  .  amLODipine (NORVASC) 5 MG tablet, Take 1 tablet (5 mg total) by mouth daily., Disp: 90 tablet, Rfl: 3 .  ASPERCREME LIDOCAINE EX, Apply 1 application topically., Disp: , Rfl:  .  brimonidine (ALPHAGAN) 0.2 % ophthalmic solution, Place 1 drop into the left eye 2 (two) times daily. , Disp: , Rfl:  .  clonazePAM (KLONOPIN) 0.5 MG tablet, TAKE 1/2 TABLET BY MOUTH TWICE DAILY FOR ANXIETY., Disp: 30 tablet, Rfl: 5 .  Cranberry 250 MG CAPS, Take 250 mg by mouth  daily., Disp: , Rfl:  .  Ergocalciferol (VITAMIN D2) 2000 UNITS TABS, Take 2,000 Units by mouth daily with lunch. , Disp: , Rfl:  .  furosemide (LASIX) 20 MG tablet, Take 1 tablet (20 mg total) by mouth daily., Disp: 30 tablet, Rfl: 12 .  latanoprost (XALATAN) 0.005 % ophthalmic solution, Place 1 drop into the left eye at bedtime. , Disp: , Rfl:  .  levothyroxine (SYNTHROID, LEVOTHROID) 125 MCG tablet, TAKE 1 TABLET BY MOUTH ONCE DAILY ON AN EMPTY STOMACH. WAIT 30 MINUTES BEFORE TAKING OTHER MEDS., Disp: 90 tablet, Rfl: 3 .  lisinopril (PRINIVIL,ZESTRIL) 40 MG tablet, Take 1 tablet (40 mg total) by mouth daily., Disp: 90 tablet, Rfl: 3 .  metoprolol succinate (TOPROL-XL) 100 MG 24 hr tablet, TAKE 1 TABLET BY MOUTH ONCE DAILY WITH OR IMMDIATELY AFTER A MEAL, Disp: 30 tablet, Rfl: 5 .  omeprazole (PRILOSEC) 20 MG capsule, TAKE 1 CAPSULE BY MOUTH ONCE DAILY, Disp: 30 capsule, Rfl: 11 .  timolol (TIMOPTIC) 0.5 % ophthalmic solution, Place 1 drop into the left eye 2 (two) times daily. , Disp: , Rfl:  .  [START ON 11/25/2017] traMADol (ULTRAM) 50 MG tablet, Take 1 tablet (50 mg total) by mouth 3 (three) times daily as needed for severe pain., Disp: 90 tablet, Rfl: 5 .  traZODone (DESYREL) 50 MG tablet, TAKE 1 TABLET BY MOUTH AT BEDTIME, Disp: 30 tablet, Rfl: 11  ROS  Constitutional: Denies any fever or chills Gastrointestinal: No reported hemesis, hematochezia, vomiting, or acute GI distress Musculoskeletal: Denies any acute onset joint swelling, redness, loss of ROM, or weakness Neurological: No reported episodes of acute onset apraxia, aphasia, dysarthria, agnosia, amnesia, paralysis, loss of coordination, or loss of consciousness  Allergies  Brittney Simpson has No Known Allergies.  [place]  Drug: Brittney Simpson  reports that she does not use drugs. Alcohol:  reports that she does not drink alcohol. Tobacco:  reports that she has never smoked. She has never used smokeless tobacco. Medical:  has a past  medical history of Anxiety, Back ache, Hiatal hernia, Hyperlipidemia, Hypertension,  Hypothyroid, Osteopenia, Thyroid disease, and UTI (urinary tract infection). Surgical: Brittney Simpson  has a past surgical history that includes Hernia repair; Hernia repair; Hemorrhoid surgery; and Kyphoplasty (N/A, 01/23/2017). Family: family history includes Cancer in her father; Stroke in her mother.  Constitutional Exam  General appearance: Well nourished, well developed, and well hydrated. In no apparent acute distress Vitals:   10/28/17 1423  BP: (!) 179/67  Pulse: 60  Resp: 16  Temp: 98.4 F (36.9 C)  TempSrc: Oral  SpO2: 98%  Weight: 125 lb (56.7 kg)  Height: _0  (1.676 m)   BMI Assessment: Estimated body mass index is 20.18 kg/m as calculated from the following:   Height as of this encounter: _1  (1.676 m).   Weight as of this encounter: 125 lb (56.7 kg). Psych/Mental status: Alert, oriented x 3 (person, place, & time)       Eyes: PERLA Respiratory: No evidence of acute respiratory distress  Cervical Spine Area Exam  Skin & Axial Inspection: No masses, redness, edema, swelling, or associated skin lesions Alignment: Symmetrical Functional ROM: Unrestricted ROM      Stability: No instability detected Muscle Tone/Strength: Functionally intact. No obvious neuro-muscular anomalies detected. Sensory (Neurological): Unimpaired Palpation: No palpable anomalies              Upper Extremity (UE) Exam    Side: Right upper extremity  Side: Left upper extremity  Skin & Extremity Inspection: Skin color, temperature, and hair growth are WNL. No peripheral edema or cyanosis. No masses, redness, swelling, asymmetry, or associated skin lesions. No contractures.  Skin & Extremity Inspection: Skin color, temperature, and hair growth are WNL. No peripheral edema or cyanosis. No masses, redness, swelling, asymmetry, or associated skin lesions. No contractures.  Functional ROM: Unrestricted ROM           Functional ROM: Unrestricted ROM          Muscle Tone/Strength: Functionally intact. No obvious neuro-muscular anomalies detected.  Muscle Tone/Strength: Functionally intact. No obvious neuro-muscular anomalies detected.  Sensory (Neurological): Unimpaired          Sensory (Neurological): Unimpaired          Palpation: No palpable anomalies              Palpation: No palpable anomalies              Specialized Test(s): Deferred         Specialized Test(s): Deferred          Thoracic Spine Area Exam  Skin & Axial Inspection: No masses, redness, or swelling Alignment: Symmetrical Functional ROM: Unrestricted ROM Stability: No instability detected Muscle Tone/Strength: Functionally intact. No obvious neuro-muscular anomalies detected. Sensory (Neurological): Unimpaired Muscle strength & Tone: No palpable anomalies  Lumbar Spine Area Exam  Skin & Axial Inspection: No masses, redness, or swelling Alignment: Symmetrical Functional ROM: Unrestricted ROM      Stability: No instability detected Muscle Tone/Strength: Functionally intact. No obvious neuro-muscular anomalies detected. Sensory (Neurological): Unimpaired Palpation: No palpable anomalies       Provocative Tests: Lumbar Hyperextension and rotation test: evaluation deferred today       Lumbar Lateral bending test: evaluation deferred today       Patrick's Maneuver: evaluation deferred today                    Gait & Posture Assessment  Ambulation: Unassisted Gait: Relatively normal for age and body habitus Posture: WNL   Lower Extremity Exam  Side: Right lower extremity  Side: Left lower extremity  Skin & Extremity Inspection: Skin color, temperature, and hair growth are WNL. No peripheral edema or cyanosis. No masses, redness, swelling, asymmetry, or associated skin lesions. No contractures.  Skin & Extremity Inspection: Skin color, temperature, and hair growth are WNL. No peripheral edema or cyanosis. No masses, redness,  swelling, asymmetry, or associated skin lesions. No contractures.  Functional ROM: Unrestricted ROM          Functional ROM: Unrestricted ROM          Muscle Tone/Strength: Functionally intact. No obvious neuro-muscular anomalies detected.  Muscle Tone/Strength: Functionally intact. No obvious neuro-muscular anomalies detected.  Sensory (Neurological): Unimpaired  Sensory (Neurological): Unimpaired  Palpation: No palpable anomalies  Palpation: No palpable anomalies   Assessment  Primary Diagnosis & Pertinent Problem List: The primary encounter diagnosis was Spondylosis without myelopathy or radiculopathy, lumbosacral region. Diagnoses of Lumbar facet syndrome (Bilateral) (R>L), Chronic sacroiliac joint pain (Right), Chronic pain syndrome, and Long term current use of opiate analgesic were also pertinent to this visit.  Status Diagnosis  Controlled Controlled Controlled 1. Spondylosis without myelopathy or radiculopathy, lumbosacral region   2. Lumbar facet syndrome (Bilateral) (R>L)   3. Chronic sacroiliac joint pain (Right)   4. Chronic pain syndrome   5. Long term current use of opiate analgesic     Problems updated and reviewed during this visit: No problems updated. Plan of Care  Pharmacotherapy (Medications Ordered): Meds ordered this encounter  Medications  . traMADol (ULTRAM) 50 MG tablet    Sig: Take 1 tablet (50 mg total) by mouth 3 (three) times daily as needed for severe pain.    Dispense:  90 tablet    Refill:  5    Do not place this medication, or any other prescription from our practice, on "Automatic Refill". Patient may have prescription filled one day early if pharmacy is closed on scheduled refill date.    Order Specific Question:   Supervising Provider    Answer:   Milinda Pointer [[id number]]   New Prescriptions   No medications on file   Medications administered today: Brittney Simpson had no medications administered during this visit. Lab-work,  procedure(s), and/or referral(s): Orders Placed This Encounter  Procedures  . ToxASSURE Select 13 (MW), Urine   Imaging and/or referral(s): None  Interventional therapies: Planned, scheduled, and/or pending:   Not at this time.   Considering:  Palliativebilateral lumbar facet block  Possible bilateral lumbar facet radiofrequency ablation.  Diagnostic right-sided intra-articular hip joint injection Palliative right-sided L3-4 lumbar epidural steroid injection Diagnostic right-sided transforaminal L3-4 epidural steroid injection Diagnostic right-sided trochanteric bursa injection Diagnostic right-sided sacroiliac joint block   Palliative PRN treatment(s):  Palliativebilateral lumbar facet block  Diagnostic right-sided intra-articular hip joint injection Palliative right-sided L3-4 lumbar epidural steroid injection Diagnostic right-sided transforaminal L3-4 epidural steroid injection Diagnostic right-sided trochanteric bursa injection Diagnostic right-sided sacroiliac joint block      Provider-requested follow-up: Return in about 6 months (around 04/29/2018) for MedMgmt with Me Dionisio David).  Future Appointments  Date Time Provider [place]  12/05/2017  2:20 PM Olin Hauser, DO [facility] None  03/10/2018  8:30 AM [facility] 2 [facility] None  03/10/2018  9:45 AM Dew, Erskine Squibb, MD [facility] None   Primary Care Physician: Olin Hauser, DO Location: [facility] Outpatient [facility] Management Facility Note by: Vevelyn Francois NP Date: 10/28/2017; Time: 3:15 PM  Pain Score Disclaimer: We use the NRS-11 scale. This is a self-reported,  subjective measurement of pain severity with only modest accuracy. It is used primarily to identify changes within a particular patient. It must be understood that outpatient pain scales are significantly less accurate that those used for research, where they can be applied under ideal controlled circumstances with  minimal exposure to variables. In reality, the score is likely to be a combination of pain intensity and pain affect, where pain affect describes the degree of emotional arousal or changes in action readiness caused by the sensory experience of pain. Factors such as social and work situation, setting, emotional state, anxiety levels, expectation, and prior pain experience may influence pain perception and show large inter-individual differences that may also be affected by time variables.  Patient instructions provided during this appointment: Patient Instructions  ____________________________________________________________________________________________  Medication Rules  Applies to: All patients receiving prescriptions (written or electronic).  Pharmacy of record: Pharmacy where electronic prescriptions will be sent. If written prescriptions are taken to a different pharmacy, please inform the nursing staff. The pharmacy listed in the electronic medical record should be the one where you would like electronic prescriptions to be sent.  Prescription refills: Only during scheduled appointments. Applies to both, written and electronic prescriptions.  NOTE: The following applies primarily to controlled substances (Opioid* Pain Medications).   Patient's responsibilities: 1. Pain Pills: Bring all pain pills to every appointment (except for procedure appointments). 2. Pill Bottles: Bring pills in original pharmacy bottle. Always bring newest bottle. Bring bottle, even if empty. 3. Medication refills: You are responsible for knowing and keeping track of what medications you need refilled. The day before your appointment, write a list of all prescriptions that need to be refilled. Bring that list to your appointment and give it to the admitting nurse. Prescriptions will be written only during appointments. If you forget a medication, it will not be "Called in", "Faxed", or "electronically sent". You  will need to get another appointment to get these prescribed. 4. Prescription Accuracy: You are responsible for carefully inspecting your prescriptions before leaving our office. Have the discharge nurse carefully go over each prescription with you, before taking them home. Make sure that your name is accurately spelled, that your address is correct. Check the name and dose of your medication to make sure it is accurate. Check the number of pills, and the written instructions to make sure they are clear and accurate. Make sure that you are given enough medication to last until your next medication refill appointment. 5. Taking Medication: Take medication as prescribed. Never take more pills than instructed. Never take medication more frequently than prescribed. Taking less pills or less frequently is permitted and encouraged, when it comes to controlled substances (written prescriptions).  6. Inform other Doctors: Always inform, all of your healthcare providers, of all the medications you take. 7. Pain Medication from other Providers: You are not allowed to accept any additional pain medication from any other Doctor or Healthcare provider. There are two exceptions to this rule. (see below) In the event that you require additional pain medication, you are responsible for notifying us, as stated below. 8. Medication Agreement: You are responsible for carefully reading and following our Medication Agreement. This must be signed before receiving any prescriptions from our practice. Safely store a copy of your signed Agreement. Violations to the Agreement will result in no further prescriptions. (Additional copies of our Medication Agreement are available upon request.) 9. Laws, Rules, & Regulations: All patients are expected to follow all Federal and [employer],  Statutes, Rules, & Regulations. Ignorance of the Laws does not constitute a valid excuse. The use of any illegal substances is prohibited. 10. Adopted  CDC guidelines & recommendations: Target dosing levels will be at or below 60 MME/day. Use of benzodiazepines** is not recommended.  Exceptions: There are only two exceptions to the rule of not receiving pain medications from other Healthcare Providers. 1. Exception #1 (Emergencies): In the event of an emergency (i.e.: accident requiring emergency care), you are allowed to receive additional pain medication. However, you are responsible for: As soon as you are able, call our office [phone], at any time of the day or night, and leave a message stating your name, the date and nature of the emergency, and the name and dose of the medication prescribed. In the event that your call is answered by a member of our staff, make sure to document and save the date, time, and the name of the person that took your information.  2. Exception #2 (Planned Surgery): In the event that you are scheduled by another doctor or dentist to have any type of surgery or procedure, you are allowed (for a period no longer than 30 days), to receive additional pain medication, for the acute post-op pain. However, in this case, you are responsible for picking up a copy of our "Post-op Pain Management for Surgeons" handout, and giving it to your surgeon or dentist. This document is available at our office, and does not require an appointment to obtain it. Simply go to our office during business hours (Monday-Thursday from 8:00 AM to 4:00 PM) (Friday 8:00 AM to 12:00 Noon) or if you have a scheduled appointment with [place], prior to your surgery, and ask for it by name. In addition, you will need to provide [place] with your name, name of your surgeon, type of surgery, and date of procedure or surgery.  *Opioid medications include: morphine, codeine, oxycodone, oxymorphone, hydrocodone, hydromorphone, meperidine, tramadol, tapentadol, buprenorphine, fentanyl, methadone. **Benzodiazepine medications include: diazepam (Valium), alprazolam  (Xanax), clonazepam (Klonopine), lorazepam (Ativan), clorazepate (Tranxene), chlordiazepoxide (Librium), estazolam (Prosom), oxazepam (Serax), temazepam (Restoril), triazolam (Halcion) (Last updated: 09/25/2017) ____________________________________________________________________________________________

## 2017-11-01 LAB — TOXASSURE SELECT 13 (MW), URINE

## 2017-11-05 DIAGNOSIS — E871 Hypo-osmolality and hyponatremia: Secondary | ICD-10-CM | POA: Diagnosis not present

## 2017-11-05 DIAGNOSIS — N183 Chronic kidney disease, stage 3 (moderate): Secondary | ICD-10-CM | POA: Diagnosis not present

## 2017-11-05 DIAGNOSIS — N390 Urinary tract infection, site not specified: Secondary | ICD-10-CM | POA: Diagnosis not present

## 2017-11-05 DIAGNOSIS — I129 Hypertensive chronic kidney disease with stage 1 through stage 4 chronic kidney disease, or unspecified chronic kidney disease: Secondary | ICD-10-CM | POA: Diagnosis not present

## 2017-12-05 ENCOUNTER — Encounter: Payer: Self-pay | Admitting: Family Medicine

## 2017-12-05 ENCOUNTER — Ambulatory Visit (INDEPENDENT_AMBULATORY_CARE_PROVIDER_SITE_OTHER): Payer: Medicare Other | Admitting: Family Medicine

## 2017-12-05 VITALS — BP 154/68 | HR 53 | Temp 98.1°F | Resp 16 | Ht 66.0 in | Wt 130.0 lb

## 2017-12-05 DIAGNOSIS — I10 Essential (primary) hypertension: Secondary | ICD-10-CM

## 2017-12-05 DIAGNOSIS — R7309 Other abnormal glucose: Secondary | ICD-10-CM

## 2017-12-05 DIAGNOSIS — E034 Atrophy of thyroid (acquired): Secondary | ICD-10-CM

## 2017-12-05 DIAGNOSIS — N183 Chronic kidney disease, stage 3 unspecified: Secondary | ICD-10-CM

## 2017-12-05 DIAGNOSIS — F5101 Primary insomnia: Secondary | ICD-10-CM

## 2017-12-05 DIAGNOSIS — F419 Anxiety disorder, unspecified: Secondary | ICD-10-CM | POA: Diagnosis not present

## 2017-12-05 MED ORDER — FUROSEMIDE 20 MG PO TABS: 20.0000 mg | ORAL_TABLET | Freq: Every day | ORAL | 5 refills | Status: DC | PRN

## 2017-12-05 NOTE — Progress Notes (Signed)
Subjective:    Patient ID: Brittney Simpson, female    DOB: September 08, 1927, 84 y.o.   MRN: [id number]  Brittney Simpson is a 84 y.o. female presenting on 12/05/2017 for Hypertension  Patient accompanied by daughter (and primary caregiver) Brittney Simpson, who provides majority of history and able to help facilitate history from her mother, limited by hearing.  HPI  Specialist: Nephrology - Dr Lavonia Dana [facility]) Pain Management - Dr Milinda Pointer [facility]) Vascular - Dr Leotis Pain ([facility])  CHRONIC HTN: Recent outside BP readings 150 to 170 on average, including per Nephrology Home readings recently fairly improved but has some chronic hard to control HTN Current Meds - Amlodipine 5mg  daily, Lisinopril 40mg  daily, Metoprolol XL 100mg  daily, Lasix 20mg  daily - However previously per Nephrology was advised to start Lasix PRN ONLY Reports good compliance, took meds today. Tolerating well, w/o complaints.  Hypothyroidism Chronic problem. Taking Levothyroxine 120mcg daily currently. Last TSH 1 year ago 0.63. Due for re-check  Elevated A1c Last check 5.5 in 10/2016. No prior abnormal sugar A1c readings.  Ankle Edema, Chronic - Reports chronic problem with intermittent flares of ankle swelling, usually worse by end of day worse if on feet, and improve overnight after sleeping. Not using compression. Taking Lasix 20mg  daily - However previously per Nephrology was advised to start Lasix PRN ONLY . Not painful or red. Not extending up legs  Chronic Low Back Pain, Lumbar DJD Spinal Stenosis / Facet DJD Followed by [facility] and Ortho See prior background information - No complaint of pain today  CKD-III, secondary to HTN Chronic problem without any recent concerns, continues to be followed by Nephrology (Dr Juleen China) q 6 months, last in 02/2017 - Currently doing well, no new concerns today  H/o Recurrent  UTI Prior history of UTI recurrently, she is being treated by Nephrology with chronic antibiotic prophylaxis with Bactrim (400-80mg  daily, single strength) several years now, preventatively, occasional in past with UTI breakthrough requires Cipro PRN. No recent UTI or use of Cipro course. Was given rx by Nephrology as back-up if needed - Currently asymptomatic. Her typical UTI symptoms arelow back pain symptoms, dysuria, frequency, also will usually get some confusion and cognitive symptoms.  ANXIETY / INSOMNIA - Last visit with me for this problem 04/2017, treated with continued Trazodone 50mg  nightly for sleep and refilled chronic Clonazepam, see prior notes for background information. - Today patient reports no new concerns. She is doing well. Anxiety is controlled. Sleeping well on medicine. - Denies any problems with withdrawal from BDZ, aware of risks of sedation and potential fall risk at age  Additional history: - Lifestyle: appetite seems improved, weight gain +5 lbs - Daughter, Brittney Simpson helps her regularly at home, arranges pill box  PMH - Presbycusis Bilateral / Poor Hearing - AAA, monitored by vascular surgery   Depression screen [facility] 2/9 12/05/2017 10/28/2017 09/17/2017  Decreased Interest 0 0 0  Down, Depressed, Hopeless 0 0 0  PHQ - 2 Score 0 0 0    Social History   Tobacco Use  . Smoking status: Never Smoker  . Smokeless tobacco: Never Used  Substance Use Topics  . Alcohol use: No    Alcohol/week: 0.0 oz  . Drug use: No    Review of Systems Per HPI unless specifically indicated above     Objective:    BP (!) 154/68   Pulse (!) 53   Temp 98.1 F (36.7 C) (  Oral)   Resp 16   Ht 5\' 6"  (1.676 m)   Wt 130 lb (59 kg)   BMI 20.98 kg/m   Wt Readings from Last 3 Encounters:  12/05/17 130 lb (59 kg)  10/28/17 125 lb (56.7 kg)  09/17/17 126 lb (57.2 kg)    Physical Exam  Constitutional: She is oriented to person, place, and time. She appears well-developed  and well-nourished. No distress.  Well and thin-appearing elderly 84 year old female, comfortable, cooperative  HENT:  Head: Normocephalic and atraumatic.  Mouth/Throat: Oropharynx is clear and moist.   Very hard of hearing.  Eyes: Conjunctivae are normal. Right eye exhibits no discharge. Left eye exhibits no discharge.  Neck: Normal range of motion. Neck supple. No thyromegaly present.  Cardiovascular: Regular rhythm, normal heart sounds and intact distal pulses.  No murmur heard. Bradycardia  Pulmonary/Chest: Effort normal and breath sounds normal. No respiratory distress. She has no wheezes. She has no rales.  Musculoskeletal: Normal range of motion. She exhibits no edema or tenderness.  Lymphadenopathy:    She has no cervical adenopathy.  Neurological: She is alert and oriented to person, place, and time.  Distal sensation intact to light touch all extremities  Skin: Skin is warm and dry. No rash noted. She is not diaphoretic. No erythema.  Psychiatric: She has a normal mood and affect. Her behavior is normal.  Well groomed, good eye contact, reduced speech overall due to hard of hearing  Nursing note and vitals reviewed.  Results for orders placed or performed in visit on 10/28/17  ToxASSURE Select 13 (MW), Urine  Result Value Ref Range   Summary FINAL       Assessment & Plan:   Problem List Items Addressed This Visit    Abnormal glucose    Previously stable Will re-check A1c yearly for trend Weight improved, seems improved lifestyle      Relevant Orders   Hemoglobin A1c   Chronic anxiety    Stable, chronic anxiety, with associated insomnia Chronically on BDZ without history of abuse or dose increase Checked [place] CSRS without red flag  Plan: 1. Reviewed chronic BDZ use for anxiety, high risk sedation, fall, confusion, withdrawal risks - she is aware of most of these, and med tolerated very well, aware not to increase dose - Continue Clonazepam 0.5mg  HALF tab BID -  not due for refills at this time, notify when ready - Reviewed and signed [facility] Controlled substance contract today, scanned      CKD (chronic kidney disease) stage 3, GFR 30-59 ml/min (HCC) - Primary    Stable CKD-III, suspected secondary to chronic HTN Followed by Nephrology Dr Juleen China q 6 months, and lab monitoring - Continue current regimen anti-HTN meds REDUCE diuretic lasix 20mg  QOD vs PRN only - as per Nephrology - Follow-up as needed q 6 Simpson      Relevant Orders   BASIC METABOLIC PANEL WITH GFR   Essential hypertension    Mild elevated BP but improved control compared to previous Complicated by CKD-III  Plan: 1. Continue current regimen - Amlodipine 5mg  daily, Lisinopril 40mg  daily, Metoprolol XL 100mg  daily - Reduce Lasix 20mg  QOD vs PRN today (as advised by nephrology) - Discussion on med changes, overall agree to no change. Potential change if still elevated SBP >170 consistently can double amlodipine from 5mg  to 10mg  daily, caution with considering HCTZ with history of hyponatremia is a concern and some minor edema 2. Encourage to stay active, low sodium diet, improve hydration  with water 3. Follow-up q 6 months  Review nephrology note and outside labs, will order labs for 2 weeks      Relevant Medications   furosemide (LASIX) 20 MG tablet   Other Relevant Orders   BASIC METABOLIC PANEL WITH GFR   Hypothyroidism    Clinically seems stable Check TSH, Free T4 within 1-2 weeks Adjust levo if need For now continue Levothyroxine 111mcg daily Check q 6 Simpson      Relevant Orders   TSH   T4, free   Insomnia    Stable, chronic underlying problem likely related to variety factors age altered sleep cycle, anxiety, chronic pain  Plan: 1. Refilled Trazodone continue 50mg  whole tab nightly Continue BDZ Clonazepam         Meds ordered this encounter  Medications  . furosemide (LASIX) 20 MG tablet    Sig: Take 1 tablet (20 mg total) by mouth daily as needed.     Dispense:  30 tablet    Refill:  5    Follow up plan: Return in about 6 months (around 06/07/2018) for Yearly Medicare Checkup.  Future labs ordered for 12/23/17 Thyroid and A1c, and then anticipate labs to be drawn on same day as upcoming yearly check-up in 05/2018  Nobie Putnam, [facility] 12/05/2017, 2:36 PM

## 2017-12-05 NOTE — Patient Instructions (Addendum)
Thank you for coming to the office today.  We will request lab results from Dr Juleen China - if missing sugar A1c, Thyroid, and others we may double check these soon.  Keep taking current medicines.  I refilled Furosemide 20mg  daily AS NEEDED - changed instruction as reviewed  Start taking it [place] now to see if swelling is still controlled, and monitor BP I don't think it is controlling BP much  After review Dr Juleen China note, I think he recommended as needed, call them to clarify   DUE for FASTING BLOOD WORK (no food or drink after midnight before the lab appointment, only water or coffee without cream/sugar on the morning of)  SCHEDULE "Lab Only" visit in the morning at the clinic for lab draw in 1-2 WEEKS - IF NEEDED after review results  - Make sure Lab Only appointment is at about 1 week before your next appointment, so that results will be available  For Lab Results, once available within 2-3 days of blood draw, you can can log in to MyChart online to view your results and a brief explanation. Also, we can discuss results at next follow-up visit.   Please schedule a Follow-up Appointment to: Return in about 6 months (around 06/07/2018) for Yearly Medicare Checkup.  If you have any other questions or concerns, please feel free to call the office or send a message through [place]. You may also schedule an earlier appointment if necessary.  Additionally, you may be receiving a survey about your experience at our office within a few days to 1 week by e-mail or mail. We value your feedback.  Nobie Putnam, DO [place]

## 2017-12-07 NOTE — Assessment & Plan Note (Addendum)
Mild elevated BP but improved control compared to previous Complicated by CKD-III  Plan: 1. Continue current regimen - Amlodipine 5mg  daily, Lisinopril 40mg  daily, Metoprolol XL 100mg  daily - Reduce Lasix 20mg  QOD vs PRN today (as advised by nephrology) - Discussion on med changes, overall agree to no change. Potential change if still elevated SBP >170 consistently can double amlodipine from 5mg  to 10mg  daily, caution with considering HCTZ with history of hyponatremia is a concern and some minor edema 2. Encourage to stay active, low sodium diet, improve hydration with water 3. Follow-up q 6 months  Review nephrology note and outside labs, will order labs for 2 weeks

## 2017-12-07 NOTE — Assessment & Plan Note (Signed)
Clinically seems stable Check TSH, Free T4 within 1-2 weeks Adjust levo if need For now continue Levothyroxine 110mcg daily Check q 6 mo

## 2017-12-07 NOTE — Assessment & Plan Note (Signed)
Stable CKD-III, suspected secondary to chronic HTN Followed by Nephrology Dr Juleen China q 6 months, and lab monitoring - Continue current regimen anti-HTN meds REDUCE diuretic lasix 20mg  QOD vs PRN only - as per Nephrology - Follow-up as needed q 6 mo

## 2017-12-07 NOTE — Assessment & Plan Note (Signed)
Stable, chronic underlying problem likely related to variety factors age altered sleep cycle, anxiety, chronic pain  Plan: 1. Refilled Trazodone continue 50mg  whole tab nightly Continue BDZ Clonazepam

## 2017-12-07 NOTE — Assessment & Plan Note (Addendum)
Stable, chronic anxiety, with associated insomnia Chronically on BDZ without history of abuse or dose increase Checked [place] CSRS without red flag  Plan: 1. Reviewed chronic BDZ use for anxiety, high risk sedation, fall, confusion, withdrawal risks - she is aware of most of these, and med tolerated very well, aware not to increase dose - Continue Clonazepam 0.5mg  HALF tab BID - not due for refills at this time, notify when ready - Reviewed and signed [facility] Controlled substance contract today, scanned

## 2017-12-07 NOTE — Assessment & Plan Note (Signed)
Previously stable Will re-check A1c yearly for trend Weight improved, seems improved lifestyle

## 2017-12-12 DIAGNOSIS — H401133 Primary open-angle glaucoma, bilateral, severe stage: Secondary | ICD-10-CM | POA: Diagnosis not present

## 2017-12-23 ENCOUNTER — Other Ambulatory Visit: Payer: Medicare Other

## 2017-12-23 DIAGNOSIS — R7309 Other abnormal glucose: Secondary | ICD-10-CM | POA: Diagnosis not present

## 2017-12-23 DIAGNOSIS — E034 Atrophy of thyroid (acquired): Secondary | ICD-10-CM | POA: Diagnosis not present

## 2017-12-23 DIAGNOSIS — N183 Chronic kidney disease, stage 3 unspecified: Secondary | ICD-10-CM

## 2017-12-23 DIAGNOSIS — I10 Essential (primary) hypertension: Secondary | ICD-10-CM

## 2017-12-24 LAB — BASIC METABOLIC PANEL WITH GFR
BUN: 8 mg/dL (ref 7–25)
CO2: 31 mmol/L (ref 20–32)
Calcium: 9.8 mg/dL (ref 8.6–10.4)
Chloride: 99 mmol/L (ref 98–110)
Creat: 0.81 mg/dL (ref 0.60–0.88)
GFR, Est African American: 76 mL/min/{1.73_m2} (ref >=60)
GFR, Est Non African American: 65 mL/min/{1.73_m2} (ref >=60)
Glucose, Bld: 93 mg/dL (ref 65–99)
Potassium: 4 mmol/L (ref 3.5–5.3)
Sodium: 138 mmol/L (ref 135–146)

## 2017-12-24 LAB — HEMOGLOBIN A1C
Hgb A1c MFr Bld: 5.6 % of total Hgb (ref <5.7)
Mean Plasma Glucose: 114 (calc)
eAG (mmol/L): 6.3 (calc)

## 2017-12-24 LAB — TSH: TSH: 0.23 mIU/L — ABNORMAL LOW (ref 0.40–4.50)

## 2017-12-24 LAB — T4, FREE: Free T4: 1.9 ng/dL — ABNORMAL HIGH (ref 0.8–1.8)

## 2017-12-26 ENCOUNTER — Other Ambulatory Visit: Payer: Self-pay | Admitting: Family Medicine

## 2017-12-26 DIAGNOSIS — E034 Atrophy of thyroid (acquired): Secondary | ICD-10-CM

## 2017-12-26 MED ORDER — LEVOTHYROXINE SODIUM 112 MCG PO TABS: ORAL_TABLET | ORAL | 3 refills | Status: DC

## 2017-12-29 ENCOUNTER — Telehealth: Payer: Self-pay | Admitting: Family Medicine

## 2017-12-29 NOTE — Telephone Encounter (Signed)
Arbie Cookey called for lab results.  She noticed to dosage for thyroid medicine  [phone]

## 2017-12-29 NOTE — Telephone Encounter (Signed)
Advised daughter 

## 2018-02-19 ENCOUNTER — Other Ambulatory Visit: Payer: Self-pay | Admitting: Family Medicine

## 2018-02-19 DIAGNOSIS — F419 Anxiety disorder, unspecified: Secondary | ICD-10-CM

## 2018-03-10 ENCOUNTER — Ambulatory Visit (INDEPENDENT_AMBULATORY_CARE_PROVIDER_SITE_OTHER): Payer: Medicare Other

## 2018-03-10 ENCOUNTER — Ambulatory Visit (INDEPENDENT_AMBULATORY_CARE_PROVIDER_SITE_OTHER): Payer: Medicare Other | Admitting: Vascular Surgery

## 2018-03-10 ENCOUNTER — Encounter (INDEPENDENT_AMBULATORY_CARE_PROVIDER_SITE_OTHER): Payer: Self-pay | Admitting: Vascular Surgery

## 2018-03-10 VITALS — BP 143/68 | HR 56 | Resp 16 | Ht 66.0 in | Wt 127.6 lb

## 2018-03-10 DIAGNOSIS — I714 Abdominal aortic aneurysm, without rupture, unspecified: Secondary | ICD-10-CM

## 2018-03-10 DIAGNOSIS — I10 Essential (primary) hypertension: Secondary | ICD-10-CM

## 2018-03-10 DIAGNOSIS — E782 Mixed hyperlipidemia: Secondary | ICD-10-CM | POA: Diagnosis not present

## 2018-03-10 NOTE — Progress Notes (Signed)
MRN : [id number]  Brittney Simpson is a 84 y.o. (Mar 28, 1928) female who presents with chief complaint of  Chief Complaint  Patient presents with  . Follow-up    21yr AAA  .  History of Present Illness: Patient returns today in follow up of her abdominal aortic aneurysm.  She has chronic low back pain she has had for many years.  No major changes.  Her duplex today shows her distal abdominal aorta to be about 3.0 cm in maximal diameter.  In the pararenal aorta she measures about 4.2 cm in maximal diameter.  Her maximal diameter by CT scan was about 3.6 cm a year and 1/2 to 2 years ago.  This is still below the threshold for prophylactic repair.  Current Outpatient Medications  Medication Sig Dispense Refill  . acetaminophen (TYLENOL) 500 MG tablet Take 1,000 mg by mouth daily as needed for moderate pain or headache.    Marland Kitchen amLODipine (NORVASC) 5 MG tablet Take 1 tablet (5 mg total) by mouth daily. 90 tablet 3  . ASPERCREME LIDOCAINE EX Apply 1 application topically.    . brimonidine (ALPHAGAN) 0.2 % ophthalmic solution Place 1 drop into the left eye 2 (two) times daily.     . clonazePAM (KLONOPIN) 0.5 MG tablet TAKE 1/2 TABLET BY MOUTH TWICE DAILY FORANXIETY 30 tablet 5  . Cranberry 250 MG CAPS Take 250 mg by mouth daily.    . Ergocalciferol (VITAMIN D2) 2000 UNITS TABS Take 2,000 Units by mouth daily with lunch.     . furosemide (LASIX) 20 MG tablet Take 1 tablet (20 mg total) by mouth daily as needed. 30 tablet 5  . latanoprost (XALATAN) 0.005 % ophthalmic solution Place 1 drop into the left eye at bedtime.     Marland Kitchen levothyroxine (SYNTHROID, LEVOTHROID) 112 MCG tablet TAKE 1 TABLET BY MOUTH ONCE DAILY ON AN EMPTY STOMACH. WAIT 30 MINUTES BEFORE TAKING OTHER MEDS. 90 tablet 3  . lisinopril (PRINIVIL,ZESTRIL) 40 MG tablet Take 1 tablet (40 mg total) by mouth daily. 90 tablet 3  . metoprolol succinate (TOPROL-XL) 100 MG 24 hr tablet TAKE 1 TABLET BY MOUTH ONCE DAILY WITH OR IMMDIATELY AFTER A  MEAL 30 tablet 5  . omeprazole (PRILOSEC) 20 MG capsule TAKE 1 CAPSULE BY MOUTH ONCE DAILY 30 capsule 11  . timolol (TIMOPTIC) 0.5 % ophthalmic solution Place 1 drop into the left eye 2 (two) times daily.     . traMADol (ULTRAM) 50 MG tablet Take 1 tablet (50 mg total) by mouth 3 (three) times daily as needed for severe pain. 90 tablet 5  . traZODone (DESYREL) 50 MG tablet TAKE 1 TABLET BY MOUTH AT BEDTIME 30 tablet 11   No current facility-administered medications for this visit.     Past Medical History:  Diagnosis Date  . Anxiety   . Back ache   . Hiatal hernia   . Hyperlipidemia   . Hypertension   . Hypothyroid   . Osteopenia   . Thyroid disease   . UTI (urinary tract infection)     Past Surgical History:  Procedure Laterality Date  . HEMORRHOID SURGERY    . HERNIA REPAIR    . HERNIA REPAIR    . KYPHOPLASTY N/A 01/23/2017   Procedure: [id number];  Surgeon: Hessie Knows, MD;  Location: [facility] ORS;  Service: Orthopedics;  Laterality: N/A;    Social History Social History   Tobacco Use  . Smoking status: Never Smoker  . Smokeless tobacco: Never Used  Substance Use Topics  . Alcohol use: No    Alcohol/week: 0.0 standard drinks  . Drug use: No    Family History Family History  Problem Relation Age of Onset  . Stroke Mother   . Cancer Father     No Known Allergies    REVIEW OF SYSTEMS(Negative unless checked)  Constitutional: [] Weight loss[] Fever[] Chills Cardiac:[] Chest pain[] Chest pressure[] Palpitations [] Shortness of breath when laying flat [] Shortness of breath at rest [] Shortness of breath with exertion. Vascular: [] Pain in legs with walking[] Pain in legsat rest[] Pain in legs when laying flat [] Claudication [] Pain in feet when walking [] Pain in feet at rest [] Pain in feet when laying flat [] History of DVT [] Phlebitis [] Swelling in legs [] Varicose veins [] Non-healing ulcers Pulmonary: [] Uses home oxygen  [] Productive cough[] Hemoptysis [] Wheeze [] COPD [] Asthma Neurologic: [] Dizziness [] Blackouts [] Seizures [] History of stroke [] History of TIA[] Aphasia [] Temporary blindness[] Dysphagia [] Weaknessor numbness in arms [] Weakness or numbnessin legs Musculoskeletal: [x] Arthritis [] Joint swelling [] Joint pain [x] Low back pain Hematologic:[] Easy bruising[] Easy bleeding [] Hypercoagulable state [] Anemic [] Hepatitis Gastrointestinal:[] Blood in stool[] Vomiting blood[] Gastroesophageal reflux/heartburn[] Abdominal pain Genitourinary: [] Chronic kidney disease [] Difficulturination [x] Frequenturination [] Burning with urination[] Hematuria Skin: [] Rashes [] Ulcers [] Wounds Psychological: [x] History of anxiety[] History of major depression    Physical Examination  BP (!) 143/68 (BP Location: Right Arm)   Pulse (!) 56   Resp 16   Ht 5\' 6"  (1.676 m)   Wt 127 lb 9.6 oz (57.9 kg)   BMI 20.60 kg/m  Gen:  WD/WN, NAD. Appears younger than stated age. Head: [place]/AT, No temporalis wasting. Ear/Nose/Throat: Hearing grossly intact, nares w/o erythema or drainage Eyes: Conjunctiva clear. Sclera non-icteric Neck: Supple.  Trachea midline Pulmonary:  Good air movement, no use of accessory muscles.  Cardiac: RRR, no JVD Vascular:  Vessel Right Left  Radial Palpable Palpable                                   Gastrointestinal: soft, non-tender/non-distended. Mild increased aortic impulse Musculoskeletal: M/S 5/5 throughout.  No deformity or atrophy. No edema. Neurologic: Sensation grossly intact in extremities.  Symmetrical.  Speech is fluent.  Psychiatric: Judgment intact, Mood & affect appropriate for pt's clinical situation. Dermatologic: No rashes or ulcers noted.  No cellulitis or open wounds.       Labs Recent Results (from the past 2160 hour(s))  BASIC METABOLIC PANEL WITH GFR     Status: None   Collection Time: 12/23/17   7:53 AM  Result Value Ref Range   Glucose, Bld 93 65 - 99 mg/dL    Comment: .            Fasting reference interval .    BUN 8 7 - 25 mg/dL   Creat 0.81 0.60 - 0.88 mg/dL    Comment: For patients >63 years of age, the reference limit for Creatinine is approximately 13% higher for people identified as African-American. .    GFR, Est Non African American 65 > OR = 60 mL/min/1.2m2   GFR, Est African American 76 > OR = 60 mL/min/1.17m2   BUN/Creatinine Ratio NOT APPLICABLE 6 - 22 (calc)   Sodium 138 135 - 146 mmol/L   Potassium 4.0 3.5 - 5.3 mmol/L   Chloride 99 98 - 110 mmol/L   CO2 31 20 - 32 mmol/L   Calcium 9.8 8.6 - 10.4 mg/dL  Hemoglobin A1c     Status: None   Collection Time: 12/23/17  7:53 AM  Result Value Ref Range   Hgb A1c MFr Bld 5.6 <5.7 %  of total Hgb    Comment: For the purpose of screening for the presence of diabetes: . <5.7%       Consistent with the absence of diabetes 5.7-6.4%    Consistent with increased risk for diabetes             (prediabetes) > or =6.5%  Consistent with diabetes . This assay result is consistent with a decreased risk of diabetes. . Currently, no consensus exists regarding use of hemoglobin A1c for diagnosis of diabetes in children. . According to American Diabetes Association (ADA) guidelines, hemoglobin A1c <7.0% represents optimal control in non-pregnant diabetic patients. Different metrics may apply to specific patient populations.  Standards of Medical Care in Diabetes(ADA). .    Mean Plasma Glucose 114 (calc)   eAG (mmol/L) 6.3 (calc)  T4, free     Status: Abnormal   Collection Time: 12/23/17  7:53 AM  Result Value Ref Range   Free T4 1.9 (H) 0.8 - 1.8 ng/dL  TSH     Status: Abnormal   Collection Time: 12/23/17  7:53 AM  Result Value Ref Range   TSH 0.23 (L) 0.40 - 4.50 mIU/L    Radiology No results found.  Assessment/Plan Essential hypertension blood pressure control important in reducing the progression of  atherosclerotic disease and aneurysmal degeneration. On appropriate oral medications.   Hyperlipidemia lipid control important in reducing the progression of atherosclerotic disease. Continue statin therapy   Chronic low back pain (Bilateral) (R>L) This not likely from her aneurysm  CKD (chronic kidney disease) stage 3, GFR 30-59 ml/min Seesnephrology. We will try to avoid any contrast administration unless the aneurysm is reaching a threshold size for consideration for repair  AAA (abdominal aortic aneurysm) without rupture (HCC) Her duplex today shows her distal abdominal aorta to be about 3.0 cm in maximal diameter.  In the pararenal aorta she measures about 4.2 cm in maximal diameter.  Her maximal diameter by CT scan was about 3.6 cm a year and 1/2 to 2 years ago.  This is still below the threshold for prophylactic repair.  No surgery or intervention at this time. The patient has an asymptomatic abdominal aortic aneurysm that is greater than 4 cm but less than 5 cm in maximal diameter.  I have discussed the natural history of abdominal aortic aneurysm and the small risk of rupture for aneurysm less than 5 cm in size.  However, as these small aneurysms tend to enlarge over time, continued surveillance with ultrasound or CT scan is mandatory.  I have also discussed optimizing medical management with hypertension and lipid control and the importance of abstinence from tobacco.  The patient is also encouraged to exercise a minimum of 30 minutes 4 times a week.  Should the patient develop new onset abdominal or back pain or signs of peripheral embolization they are instructed to seek medical attention immediately and to alert the physician providing care that they have an aneurysm.  The patient voices their understanding. I have scheduled the patient to return in 6 months with an aortic duplex.    Leotis Pain, MD  03/10/2018 10:03 AM    This note was created with Dragon medical  transcription system.  Any errors from dictation are purely unintentional

## 2018-03-10 NOTE — Patient Instructions (Signed)
Abdominal Aortic Aneurysm Blood pumps away from the heart through tubes (blood vessels) called arteries. Aneurysms are weak or damaged places in the wall of an artery. It bulges out like a balloon. An abdominal aortic aneurysm happens in the main artery of the body (aorta). It can burst or tear, causing bleeding inside the body. This is an emergency. It needs treatment right away. What are the causes? The exact cause is unknown. Things that could cause this problem include:  Fat and other substances building up in the lining of a tube.  Swelling of the walls of a blood vessel.  Certain tissue diseases.  Belly (abdominal) trauma.  An infection in the main artery of the body.  What increases the risk? There are things that make it more likely for you to have an aneurysm. These include:  Being over the age of 82 years old.  Having high blood pressure (hypertension).  Being a female.  Being white.  Being very overweight (obese).  Having a family history of aneurysm.  Using tobacco products.  What are the signs or symptoms? Symptoms depend on the size of the aneurysm and how fast it grows. There may not be symptoms. If symptoms occur, they can include:  Pain (belly, side, lower back, or groin).  Feeling full after eating a small amount of food.  Feeling sick to your stomach (nauseous), throwing up (vomiting), or both.  Feeling a lump in your belly that feels like it is beating (pulsating).  Feeling like you will pass out (faint).  How is this treated?  Medicine to control blood pressure and pain.  Imaging tests to see if the aneurysm gets bigger.  Surgery. How is this prevented? To lessen your chance of getting this condition:  Stop smoking. Stop chewing tobacco.  Limit or avoid alcohol.  Keep your blood pressure, blood sugar, and cholesterol within normal limits.  Eat less salt.  Eat foods low in saturated fats and cholesterol. These are found in animal and  whole dairy products.  Eat more fiber. Fiber is found in whole grains, vegetables, and fruits.  Keep a healthy weight.  Stay active and exercise often.  This information is not intended to replace advice given to you by your health care provider. Make sure you discuss any questions you have with your health care provider. Document Released: 11/09/2012 Document Revised: 12/21/2015 Document Reviewed: 08/14/2012 Elsevier Interactive Patient Education  2017 [employer]  

## 2018-03-10 NOTE — Assessment & Plan Note (Signed)
Her duplex today shows her distal abdominal aorta to be about 3.0 cm in maximal diameter.  In the pararenal aorta she measures about 4.2 cm in maximal diameter.  Her maximal diameter by CT scan was about 3.6 cm a year and 1/2 to 2 years ago.  This is still below the threshold for prophylactic repair.  No surgery or intervention at this time. The patient has an asymptomatic abdominal aortic aneurysm that is greater than 4 cm but less than 5 cm in maximal diameter.  I have discussed the natural history of abdominal aortic aneurysm and the small risk of rupture for aneurysm less than 5 cm in size.  However, as these small aneurysms tend to enlarge over time, continued surveillance with ultrasound or CT scan is mandatory.  I have also discussed optimizing medical management with hypertension and lipid control and the importance of abstinence from tobacco.  The patient is also encouraged to exercise a minimum of 30 minutes 4 times a week.  Should the patient develop new onset abdominal or back pain or signs of peripheral embolization they are instructed to seek medical attention immediately and to alert the physician providing care that they have an aneurysm.  The patient voices their understanding. I have scheduled the patient to return in 6 months with an aortic duplex.

## 2018-03-19 ENCOUNTER — Telehealth: Payer: Self-pay | Admitting: Family Medicine

## 2018-03-19 NOTE — Telephone Encounter (Signed)
Pt needs a refill on clonazepam sent to [facility].

## 2018-03-19 NOTE — Telephone Encounter (Signed)
Patient has 5 refill left and left detail message for patient.

## 2018-03-28 ENCOUNTER — Other Ambulatory Visit: Payer: Self-pay | Admitting: Family Medicine

## 2018-03-28 DIAGNOSIS — N390 Urinary tract infection, site not specified: Secondary | ICD-10-CM

## 2018-04-10 ENCOUNTER — Other Ambulatory Visit: Payer: Self-pay | Admitting: Family Medicine

## 2018-04-10 DIAGNOSIS — I10 Essential (primary) hypertension: Secondary | ICD-10-CM

## 2018-04-14 DIAGNOSIS — H401133 Primary open-angle glaucoma, bilateral, severe stage: Secondary | ICD-10-CM | POA: Diagnosis not present

## 2018-04-17 ENCOUNTER — Other Ambulatory Visit: Payer: Self-pay | Admitting: Family Medicine

## 2018-04-17 DIAGNOSIS — I10 Essential (primary) hypertension: Secondary | ICD-10-CM

## 2018-04-20 DIAGNOSIS — H401133 Primary open-angle glaucoma, bilateral, severe stage: Secondary | ICD-10-CM | POA: Diagnosis not present

## 2018-04-29 ENCOUNTER — Encounter: Payer: Self-pay | Admitting: Nurse Practitioner

## 2018-04-29 ENCOUNTER — Ambulatory Visit: Payer: Medicare Other | Attending: Nurse Practitioner | Admitting: Nurse Practitioner

## 2018-04-29 ENCOUNTER — Other Ambulatory Visit: Payer: Self-pay

## 2018-04-29 VITALS — BP 171/79 | HR 56 | Temp 98.3°F | Resp 18 | Ht 66.0 in | Wt 125.0 lb

## 2018-04-29 DIAGNOSIS — K219 Gastro-esophageal reflux disease without esophagitis: Secondary | ICD-10-CM | POA: Insufficient documentation

## 2018-04-29 DIAGNOSIS — Z79899 Other long term (current) drug therapy: Secondary | ICD-10-CM | POA: Insufficient documentation

## 2018-04-29 DIAGNOSIS — Z5181 Encounter for therapeutic drug level monitoring: Secondary | ICD-10-CM | POA: Diagnosis not present

## 2018-04-29 DIAGNOSIS — Z79891 Long term (current) use of opiate analgesic: Secondary | ICD-10-CM | POA: Diagnosis not present

## 2018-04-29 DIAGNOSIS — M47816 Spondylosis without myelopathy or radiculopathy, lumbar region: Secondary | ICD-10-CM | POA: Diagnosis not present

## 2018-04-29 DIAGNOSIS — Z79890 Hormone replacement therapy (postmenopausal): Secondary | ICD-10-CM | POA: Insufficient documentation

## 2018-04-29 DIAGNOSIS — M4317 Spondylolisthesis, lumbosacral region: Secondary | ICD-10-CM | POA: Diagnosis not present

## 2018-04-29 DIAGNOSIS — G894 Chronic pain syndrome: Secondary | ICD-10-CM | POA: Insufficient documentation

## 2018-04-29 DIAGNOSIS — M48061 Spinal stenosis, lumbar region without neurogenic claudication: Secondary | ICD-10-CM | POA: Insufficient documentation

## 2018-04-29 DIAGNOSIS — M545 Low back pain: Secondary | ICD-10-CM | POA: Insufficient documentation

## 2018-04-29 DIAGNOSIS — I714 Abdominal aortic aneurysm, without rupture: Secondary | ICD-10-CM | POA: Diagnosis not present

## 2018-04-29 DIAGNOSIS — N183 Chronic kidney disease, stage 3 (moderate): Secondary | ICD-10-CM | POA: Diagnosis not present

## 2018-04-29 DIAGNOSIS — M47817 Spondylosis without myelopathy or radiculopathy, lumbosacral region: Secondary | ICD-10-CM

## 2018-04-29 DIAGNOSIS — I129 Hypertensive chronic kidney disease with stage 1 through stage 4 chronic kidney disease, or unspecified chronic kidney disease: Secondary | ICD-10-CM | POA: Diagnosis not present

## 2018-04-29 MED ORDER — TRAMADOL HCL 50 MG PO TABS: 50.0000 mg | ORAL_TABLET | Freq: Three times a day (TID) | ORAL | 5 refills | Status: DC | PRN

## 2018-04-29 NOTE — Patient Instructions (Addendum)
Rx for Tramadol to last until 11/16/2018 has been escribed to your pharmacy.    Moderate Conscious Sedation, Adult Sedation is the use of medicines to promote relaxation and relieve discomfort and anxiety. Moderate conscious sedation is a type of sedation. Under moderate conscious sedation, you are less alert than normal, but you are still able to respond to instructions, touch, or both. Moderate conscious sedation is used during short medical and dental procedures. It is milder than deep sedation, which is a type of sedation under which you cannot be easily woken up. It is also milder than general anesthesia, which is the use of medicines to make you unconscious. Moderate conscious sedation allows you to return to your regular activities sooner. Tell a health care provider about:  Any allergies you have.  All medicines you are taking, including vitamins, herbs, eye drops, creams, and over-the-counter medicines.  Use of steroids (by mouth or creams).  Any problems you or family members have had with sedatives and anesthetic medicines.  Any blood disorders you have.  Any surgeries you have had.  Any medical conditions you have, such as sleep apnea.  Whether you are pregnant or may be pregnant.  Any use of cigarettes, alcohol, marijuana, or street drugs. What are the risks? Generally, this is a safe procedure. However, problems may occur, including:  Getting too much medicine (oversedation).  Nausea.  Allergic reaction to medicines.  Trouble breathing. If this happens, a breathing tube may be used to help with breathing. It will be removed when you are awake and breathing on your own.  Heart trouble.  Lung trouble.  What happens before the procedure? Staying hydrated Follow instructions from your health care provider about hydration, which may include:  Up to 2 hours before the procedure - you may continue to drink clear liquids, such as water, clear fruit juice, black  coffee, and plain tea.  Eating and drinking restrictions Follow instructions from your health care provider about eating and drinking, which may include:  8 hours before the procedure - stop eating heavy meals or foods such as meat, fried foods, or fatty foods.  6 hours before the procedure - stop eating light meals or foods, such as toast or cereal.  6 hours before the procedure - stop drinking milk or drinks that contain milk.  2 hours before the procedure - stop drinking clear liquids.  Medicine  Ask your health care provider about:  Changing or stopping your regular medicines. This is especially important if you are taking diabetes medicines or blood thinners.  Taking medicines such as aspirin and ibuprofen. These medicines can thin your blood. Do not take these medicines before your procedure if your health care provider instructs you not to.  Tests and exams  You will have a physical exam.  You may have blood tests done to show: ? How well your kidneys and liver are working. ? How well your blood can clot. General instructions  Plan to have someone take you home from the hospital or clinic.  If you will be going home right after the procedure, plan to have someone with you for 24 hours. What happens during the procedure?  An IV tube will be inserted into one of your veins.  Medicine to help you relax (sedative) will be given through the IV tube.  The medical or dental procedure will be performed. What happens after the procedure?  Your blood pressure, heart rate, breathing rate, and blood oxygen level will be monitored often  until the medicines you were given have worn off.  Do not drive for 24 hours. This information is not intended to replace advice given to you by your health care provider. Make sure you discuss any questions you have with your health care provider. Document Released: 04/09/2001 Document Revised: 12/19/2015 Document Reviewed:  11/04/2015 Elsevier Interactive Patient Education  2018 [place]  What are the risk, side effects and possible complications? Generally speaking, most procedures are safe.  However, with any procedure there are risks, side effects, and the possibility of complications.  The risks and complications are dependent upon the sites that are lesioned, or the type of nerve block to be performed.  The closer the procedure is to the spine, the more serious the risks are.  Great care is taken when placing the radio frequency needles, block needles or lesioning probes, but sometimes complications can occur. 1. Infection: Any time there is an injection through the skin, there is a risk of infection.  This is why sterile conditions are used for these blocks.  There are four possible types of infection. 1. Localized skin infection. 2. Central Nervous System Infection-This can be in the form of Meningitis, which can be deadly. 3. Epidural Infections-This can be in the form of an epidural abscess, which can cause pressure inside of the spine, causing compression of the spinal cord with subsequent paralysis. This would require an emergency surgery to decompress, and there are no guarantees that the patient would recover from the paralysis. 4. Discitis-This is an infection of the intervertebral discs.  It occurs in about 1% of discography procedures.  It is difficult to treat and it may lead to surgery.        2. Pain: the needles have to go through skin and soft tissues, will cause soreness.       3. Damage to internal structures:  The nerves to be lesioned may be near blood vessels or    other nerves which can be potentially damaged.       4. Bleeding: Bleeding is more common if the patient is taking blood thinners such as  aspirin, Coumadin, Ticiid, Plavix, etc., or if he/she have some genetic predisposition  such as hemophilia. Bleeding into the spinal canal can cause  compression of the spinal  cord with subsequent paralysis.  This would require an emergency surgery to  decompress and there are no guarantees that the patient would recover from the  paralysis.       5. Pneumothorax:  Puncturing of a lung is a possibility, every time a needle is introduced in  the area of the chest or upper back.  Pneumothorax refers to free air around the  collapsed lung(s), inside of the thoracic cavity (chest cavity).  Another two possible  complications related to a similar event would include: Hemothorax and Chylothorax.   These are variations of the Pneumothorax, where instead of air around the collapsed  lung(s), you may have blood or chyle, respectively.       6. Spinal headaches: They may occur with any procedures in the area of the spine.       7. Persistent CSF (Cerebro-Spinal Fluid) leakage: This is a rare problem, but may occur  with prolonged intrathecal or epidural catheters either due to the formation of a fistulous  track or a dural tear.       8. Nerve damage: By working so close to the spinal cord, there is always a possibility  of  nerve damage, which could be as serious as a permanent spinal cord injury with  paralysis.       9. Death:  Although rare, severe deadly allergic reactions known as "Anaphylactic  reaction" can occur to any of the medications used.      10. Worsening of the symptoms:  We can always make thing worse.  What are the chances of something like this happening? Chances of any of this occuring are extremely low.  By statistics, you have more of a chance of getting killed in a motor vehicle accident: while driving to the hospital than any of the above occurring .  Nevertheless, you should be aware that they are possibilities.  In general, it is similar to taking a shower.  Everybody knows that you can slip, hit your head and get killed.  Does that mean that you should not shower again?  Nevertheless always keep in mind that statistics do not mean  anything if you happen to be on the wrong side of them.  Even if a procedure has a 1 (one) in a 1,000,000 (million) chance of going wrong, it you happen to be that one..Also, keep in mind that by statistics, you have more of a chance of having something go wrong when taking medications.  Who should not have this procedure? If you are on a blood thinning medication (e.g. Coumadin, Plavix, see list of "Blood Thinners"), or if you have an active infection going on, you should not have the procedure.  If you are taking any blood thinners, please inform your physician.  How should I prepare for this procedure?  Do not eat or drink anything at least six hours prior to the procedure.  Bring a driver with you .  It cannot be a taxi.  Come accompanied by an adult that can drive you back, and that is strong enough to help you if your legs get weak or numb from the local anesthetic.  Take all of your medicines the morning of the procedure with just enough water to swallow them.  If you have diabetes, make sure that you are scheduled to have your procedure done first thing in the morning, whenever possible.  If you have diabetes, take only half of your insulin dose and notify our nurse that you have done so as soon as you arrive at the clinic.  If you are diabetic, but only take blood sugar pills (oral hypoglycemic), then do not take them on the morning of your procedure.  You may take them after you have had the procedure.  Do not take aspirin or any aspirin-containing medications, at least eleven (11) days prior to the procedure.  They may prolong bleeding.  Wear loose fitting clothing that may be easy to take off and that you would not mind if it got stained with Betadine or blood.  Do not wear any jewelry or perfume  Remove any nail coloring.  It will interfere with some of our monitoring equipment.  NOTE: Remember that this is not meant to be interpreted as a complete list of all possible  complications.  Unforeseen problems may occur.  BLOOD THINNERS The following drugs contain aspirin or other products, which can cause increased bleeding during surgery and should not be taken for 2 weeks prior to and 1 week after surgery.  If you should need take something for relief of minor pain, you may take acetaminophen which is found in Tylenol,m Datril, Anacin-3 and Panadol. It is  not blood thinner. The products listed below are.  Do not take any of the products listed below in addition to any listed on your instruction sheet.  A.P.C or A.P.C with Codeine Codeine Phosphate Capsules #3 Ibuprofen Ridaura  ABC compound Congesprin Imuran rimadil  Advil Cope Indocin Robaxisal  Alka-Seltzer Effervescent Pain Reliever and Antacid Coricidin or Coricidin-D  Indomethacin Rufen  Alka-Seltzer plus Cold Medicine Cosprin Ketoprofen S-A-C Tablets  Anacin Analgesic Tablets or Capsules Coumadin Korlgesic Salflex  Anacin Extra Strength Analgesic tablets or capsules CP-2 Tablets Lanoril Salicylate  Anaprox Cuprimine Capsules Levenox Salocol  Anexsia-D Dalteparin Magan Salsalate  Anodynos Darvon compound Magnesium Salicylate Sine-off  Ansaid Dasin Capsules Magsal Sodium Salicylate  Anturane Depen Capsules Marnal Soma  APF Arthritis pain formula Dewitt's Pills Measurin Stanback  Argesic Dia-Gesic Meclofenamic Sulfinpyrazone  Arthritis Bayer Timed Release Aspirin Diclofenac Meclomen Sulindac  Arthritis pain formula Anacin Dicumarol Medipren Supac  Analgesic (Safety coated) Arthralgen Diffunasal Mefanamic Suprofen  Arthritis Strength Bufferin Dihydrocodeine Mepro Compound Suprol  Arthropan liquid Dopirydamole Methcarbomol with Aspirin Synalgos  ASA tablets/Enseals Disalcid Micrainin Tagament  Ascriptin Doan's Midol Talwin  Ascriptin A/D Dolene Mobidin Tanderil  Ascriptin Extra Strength Dolobid Moblgesic Ticlid  Ascriptin with Codeine Doloprin or Doloprin with Codeine Momentum Tolectin  Asperbuf Duoprin  Mono-gesic Trendar  Aspergum Duradyne Motrin or Motrin IB Triminicin  Aspirin plain, buffered or enteric coated Durasal Myochrisine Trigesic  Aspirin Suppositories Easprin Nalfon Trillsate  Aspirin with Codeine Ecotrin Regular or Extra Strength Naprosyn Uracel  Atromid-S Efficin Naproxen Ursinus  Auranofin Capsules Elmiron Neocylate Vanquish  Axotal Emagrin Norgesic Verin  Azathioprine Empirin or Empirin with Codeine Normiflo Vitamin E  Azolid Emprazil Nuprin Voltaren  Bayer Aspirin plain, buffered or children's or timed BC Tablets or powders Encaprin Orgaran Warfarin Sodium  Buff-a-Comp Enoxaparin Orudis Zorpin  Buff-a-Comp with Codeine Equegesic Os-Cal-Gesic   Buffaprin Excedrin plain, buffered or Extra Strength Oxalid   Bufferin Arthritis Strength Feldene Oxphenbutazone   Bufferin plain or Extra Strength Feldene Capsules Oxycodone with Aspirin   Bufferin with Codeine Fenoprofen Fenoprofen Pabalate or Pabalate-SF   Buffets II Flogesic Panagesic   Buffinol plain or Extra Strength Florinal or Florinal with Codeine Panwarfarin   Buf-Tabs Flurbiprofen Penicillamine   Butalbital Compound Four-way cold tablets Penicillin   Butazolidin Fragmin Pepto-Bismol   Carbenicillin Geminisyn Percodan   Carna Arthritis Reliever Geopen Persantine   Carprofen Gold's salt Persistin   Chloramphenicol Goody's Phenylbutazone   Chloromycetin Haltrain Piroxlcam   Clmetidine heparin Plaquenil   Cllnoril Hyco-pap Ponstel   Clofibrate Hydroxy chloroquine Propoxyphen         Before stopping any of these medications, be sure to consult the physician who ordered them.  Some, such as Coumadin (Warfarin) are ordered to prevent or treat serious conditions such as "deep thrombosis", "pumonary embolisms", and other heart problems.  The amount of time that you may need off of the medication may also vary with the medication and the reason for which you were taking it.  If you are taking any of these medications, please  make sure you notify your pain physician before you undergo any procedures.         Facet Blocks Patient Information  Description: The facets are joints in the spine between the vertebrae.  Like any joints in the body, facets can become irritated and painful.  Arthritis can also effect the facets.  By injecting steroids and local anesthetic in and around these joints, we can temporarily block the nerve supply to them.  Steroids act  directly on irritated nerves and tissues to reduce selling and inflammation which often leads to decreased pain.  Facet blocks may be done anywhere along the spine from the neck to the low back depending upon the location of your pain.   After numbing the skin with local anesthetic (like Novocaine), a small needle is passed onto the facet joints under x-ray guidance.  You may experience a sensation of pressure while this is being done.  The entire block usually lasts about 15-25 minutes.   Conditions which may be treated by facet blocks:   Low back/buttock pain  Neck/shoulder pain  Certain types of headaches  Preparation for the injection:  1. Do not eat any solid food or dairy products within 8 hours of your appointment. 2. You may drink clear liquid up to 3 hours before appointment.  Clear liquids include water, black coffee, juice or soda.  No milk or cream please. 3. You may take your regular medication, including pain medications, with a sip of water before your appointment.  Diabetics should hold regular insulin (if taken separately) and take 1/2 normal NPH dose the morning of the procedure.  Carry some sugar containing items with you to your appointment. 4. A driver must accompany you and be prepared to drive you home after your procedure. 5. Bring all your current medications with you. 6. An IV may be inserted and sedation may be given at the discretion of the physician. 7. A blood pressure cuff, EKG and other monitors will often be applied during  the procedure.  Some patients may need to have extra oxygen administered for a short period. 8. You will be asked to provide medical information, including your allergies and medications, prior to the procedure.  We must know immediately if you are taking blood thinners (like Coumadin/Warfarin) or if you are allergic to IV iodine contrast (dye).  We must know if you could possible be pregnant.  Possible side-effects:   Bleeding from needle site  Infection (rare, may require surgery)  Nerve injury (rare)  Numbness & tingling (temporary)  Difficulty urinating (rare, temporary)  Spinal headache (a headache worse with upright posture)  Light-headedness (temporary)  Pain at injection site (serveral days)  Decreased blood pressure (rare, temporary)  Weakness in arm/leg (temporary)  Pressure sensation in back/neck (temporary)   Call if you experience:   Fever/chills associated with headache or increased back/neck pain  Headache worsened by an upright position  New onset, weakness or numbness of an extremity below the injection site  Hives or difficulty breathing (go to the emergency room)  Inflammation or drainage at the injection site(s)  Severe back/neck pain greater than usual  New symptoms which are concerning to you  Please note:  Although the local anesthetic injected can often make your back or neck feel good for several hours after the injection, the pain will likely return. It takes 3-7 days for steroids to work.  You may not notice any pain relief for at least one week.  If effective, we will often do a series of 2-3 injections spaced 3-6 weeks apart to maximally decrease your pain.  After the initial series, you may be a candidate for a more permanent nerve block of the facets.  If you have any questions, please call #[phone]) [facility]____________________________________________________________________________________________  Medication Rules  Applies to: All patients receiving prescriptions (written or electronic).  Pharmacy of record: Pharmacy where electronic prescriptions will be sent. If written prescriptions are  taken to a different pharmacy, please inform the nursing staff. The pharmacy listed in the electronic medical record should be the one where you would like electronic prescriptions to be sent.  Prescription refills: Only during scheduled appointments. Applies to both, written and electronic prescriptions.  NOTE: The following applies primarily to controlled substances (Opioid* Pain Medications).   Patient's responsibilities: 59. Pain Pills: Bring all pain pills to every appointment (except for procedure appointments). 24. Pill Bottles: Bring pills in original pharmacy bottle. Always bring newest bottle. Bring bottle, even if empty. 25. Medication refills: You are responsible for knowing and keeping track of what medications you need refilled. The day before your appointment, write a list of all prescriptions that need to be refilled. Bring that list to your appointment and give it to the admitting nurse. Prescriptions will be written only during appointments. If you forget a medication, it will not be "Called in", "Faxed", or "electronically sent". You will need to get another appointment to get these prescribed. 26. Prescription Accuracy: You are responsible for carefully inspecting your prescriptions before leaving our office. Have the discharge nurse carefully go over each prescription with you, before taking them home. Make sure that your name is accurately spelled, that your address is correct. Check the name and dose of your medication to make sure it is accurate. Check the number of pills, and the written instructions to make sure they are clear and accurate. Make sure that you are given enough  medication to last until your next medication refill appointment. 27. Taking Medication: Take medication as prescribed. Never take more pills than instructed. Never take medication more frequently than prescribed. Taking less pills or less frequently is permitted and encouraged, when it comes to controlled substances (written prescriptions).  28. Inform other Doctors: Always inform, all of your healthcare providers, of all the medications you take. 29. Pain Medication from other Providers: You are not allowed to accept any additional pain medication from any other Doctor or Healthcare provider. There are two exceptions to this rule. (see below) In the event that you require additional pain medication, you are responsible for notifying us, as stated below. 30. Medication Agreement: You are responsible for carefully reading and following our Medication Agreement. This must be signed before receiving any prescriptions from our practice. Safely store a copy of your signed Agreement. Violations to the Agreement will result in no further prescriptions. (Additional copies of our Medication Agreement are available upon request.) 31. Laws, Rules, & Regulations: All patients are expected to follow all Federal and [employer], [employer], Rules, [employer]. Ignorance of the Laws does not constitute a valid excuse. The use of any illegal substances is prohibited. 100. Adopted CDC guidelines & recommendations: Target dosing levels will be at or below 60 MME/day. Use of benzodiazepines** is not recommended.  Exceptions: There are only two exceptions to the rule of not receiving pain medications from other Healthcare Providers. 1. Exception #1 (Emergencies): In the event of an emergency (i.e.: accident requiring emergency care), you are allowed to receive additional pain medication. However, you are responsible for: As soon as you are able, call our office [phone], at any time of the day or night, and leave a  message stating your name, the date and nature of the emergency, and the name and dose of the medication prescribed. In the event that your call is answered by a member of our staff, make sure to document and save the date, time, and the name of the  person that took your information.  2. Exception #2 (Planned Surgery): In the event that you are scheduled by another doctor or dentist to have any type of surgery or procedure, you are allowed (for a period no longer than 30 days), to receive additional pain medication, for the acute post-op pain. However, in this case, you are responsible for picking up a copy of our "Post-op Pain Management for Surgeons" handout, and giving it to your surgeon or dentist. This document is available at our office, and does not require an appointment to obtain it. Simply go to our office during business hours (Monday-Thursday from 8:00 AM to 4:00 PM) (Friday 8:00 AM to 12:00 Noon) or if you have a scheduled appointment with [place], prior to your surgery, and ask for it by name. In addition, you will need to provide [place] with your name, name of your surgeon, type of surgery, and date of procedure or surgery.  *Opioid medications include: morphine, codeine, oxycodone, oxymorphone, hydrocodone, hydromorphone, meperidine, tramadol, tapentadol, buprenorphine, fentanyl, methadone. **Benzodiazepine medications include: diazepam (Valium), alprazolam (Xanax), clonazepam (Klonopine), lorazepam (Ativan), clorazepate (Tranxene), chlordiazepoxide (Librium), estazolam (Prosom), oxazepam (Serax), temazepam (Restoril), triazolam (Halcion) (Last updated: 09/25/2017) ____________________________________________________________________________________________

## 2018-04-29 NOTE — Progress Notes (Signed)
Nursing Pain Medication Assessment:  Safety precautions to be maintained throughout the outpatient stay will include: orient to surroundings, keep bed in low position, maintain call bell within reach at all times, provide assistance with transfer out of bed and ambulation.  Medication Inspection Compliance: Pill count conducted under aseptic conditions, in front of the patient. Neither the pills nor the bottle was removed from the patient's sight at any time. Once count was completed pills were immediately returned to the patient in their original bottle.  Medication: Tramadol (Ultram) Pill/Patch Count: 72 of 90 pills remain Pill/Patch Appearance: Markings consistent with prescribed medication Bottle Appearance: Standard pharmacy container. Clearly labeled. Filled Date: 91 / 20 / 2019 Last Medication intake:  Yesterday

## 2018-04-29 NOTE — Progress Notes (Addendum)
Patient's Name: Brittney Simpson  MRN: [id number]  Referring Provider: Nobie Putnam *  DOB: 12-Jul-1928  PCP: Olin Hauser, DO  DOS: 04/29/2018  Note by: Vevelyn Francois NP  Service setting: Ambulatory outpatient  Specialty: Interventional Pain Management  Location: [facility] (AMB) Pain Management Facility    Patient type: Established    Primary Reason(s) for Visit: Encounter for prescription drug management. (Level of risk: moderate)  CC: Back Pain (lower)  HPI  Brittney Simpson is a 84 y.o. year old, female patient, who comes today for a medication management evaluation. She has Long term current use of opiate analgesic; Long term prescription opiate use; Opiate use (15 MME/Day); Encounter for therapeutic drug level monitoring; Lumbar facet syndrome (Bilateral) (R>L); Chronic low back pain (Bilateral) (R>L); Spondylolisthesis of lumbosacral region (L2-3 and L5-S1); Lumbar spinal stenosis (9 mm at L3-4); Trochanteric bursitis (Right); Chronic hip pain (Right); Osteoarthritis of hip (Right); Chronic sacroiliac joint pain (Right); Chronic lumbar radicular pain (Right); Chronic lower extremity pain (Right); Myofascial pain; Encounter for chronic pain management; Essential hypertension; CKD (chronic kidney disease) stage 3, GFR 30-59 ml/min ([place]); Skin lesion of scalp; Chronic anxiety; Insomnia; Hypothyroidism; Spondylosis without myelopathy or radiculopathy, lumbosacral region; Recurrent UTI; GERD (gastroesophageal reflux disease); Presbycusis of both ears; Hyperlipidemia; Abnormal glucose; Bilateral hearing loss; Osteoporosis; AAA (abdominal aortic aneurysm) without rupture (HCC); and Ankle edema, bilateral on their problem list. Her primarily concern today is the Back Pain (lower)  Pain Assessment: Location: Lower Back Radiating: denies Onset: More than a month ago Duration: Chronic pain Quality: Constant("it just hurts") Severity: 6 /10 (subjective, self-reported pain score)  Note:  Reported level is compatible with observation. Clinically the patient looks like a 2/10 A 2/10 is viewed as "Mild to Moderate" and described as noticeable and distracting. Impossible to hide from other people. More frequent flare-ups. Still possible to adapt and function close to normal. It can be very annoying and may have occasional stronger flare-ups. With discipline, patients may get used to it and adapt. Brittney Simpson does not seem to understand the use of our objective pain scale When using our objective Pain Scale, levels between 6 and 10/10 are said to belong in an emergency room, as it progressively worsens from a 6/10, described as severely limiting, requiring emergency care not usually available at an outpatient pain management facility. At a 6/10 level, communication becomes difficult and requires great effort. Assistance to reach the emergency department may be required. Facial flushing and profuse sweating along with potentially dangerous increases in heart rate and blood pressure will be evident. Effect on ADL:   Timing: Constant Modifying factors: meds BP: (!) 171/79  HR: (!) 56  Ms. Gerstel was last scheduled for an appointment on Visit date not found for medication management. During today's appointment we reviewed Brittney Simpson's chronic pain status, as well as her outpatient medication regimen.  The patient  reports that she does not use drugs. Her body mass index is 20.18 kg/m.  Further details on both, my assessment(s), as well as the proposed treatment plan, please see below.  Controlled Substance Pharmacotherapy Assessment REMS (Risk Evaluation and Mitigation Strategy)  Analgesic:Tramadol 50 mg 1 tablet by mouth every 8 hours (150 mg/day of tramadol) MME/day:15 mg/day. Rise Patience, RN  04/29/2018 11:40 AM  Signed Nursing Pain Medication Assessment:  Safety precautions to be maintained throughout the outpatient stay will include: orient to surroundings, keep bed in low  position, maintain call bell within reach at all times, provide assistance with transfer  out of bed and ambulation.  Medication Inspection Compliance: Pill count conducted under aseptic conditions, in front of the patient. Neither the pills nor the bottle was removed from the patient's sight at any time. Once count was completed pills were immediately returned to the patient in their original bottle.  Medication: Tramadol (Ultram) Pill/Patch Count: 72 of 90 pills remain Pill/Patch Appearance: Markings consistent with prescribed medication Bottle Appearance: Standard pharmacy container. Clearly labeled. Filled Date: 69 / 20 / 2019 Last Medication intake:  Yesterday   Pharmacokinetics: Liberation and absorption (onset of action): WNL Distribution (time to peak effect): WNL Metabolism and excretion (duration of action): WNL         Pharmacodynamics: Desired effects: Analgesia: Brittney Simpson reports >50% benefit. Functional ability: Patient reports that medication allows her to accomplish basic ADLs Clinically meaningful improvement in function (CMIF): Sustained CMIF goals met Perceived effectiveness: Described as relatively effective, allowing for increase in activities of daily living (ADL) Undesirable effects: Side-effects or Adverse reactions: None reported Monitoring: [place] PMP: Online review of the past 11-monthperiod conducted. Compliant with practice rules and regulations Last UDS on record: Summary  Date Value Ref Range Status  10/28/2017 FINAL  Final    Comment:    ==================================================================== TOXASSURE SELECT 13 (MW) ==================================================================== Test                             Result       Flag       Units Drug Present and Declared for Prescription Verification   Tramadol                       >20000       EXPECTED   ng/mg creat   O-Desmethyltramadol            >20000       EXPECTED   ng/mg creat    N-Desmethyltramadol            8244         EXPECTED   ng/mg creat    Source of tramadol is a prescription medication.    O-desmethyltramadol and N-desmethyltramadol are expected    metabolites of tramadol. Drug Present not Declared for Prescription Verification   7-aminoclonazepam              248          UNEXPECTED ng/mg creat    7-aminoclonazepam is an expected metabolite of clonazepam. Source    of clonazepam is a scheduled prescription medication. ==================================================================== Test                      Result    Flag   Units      Ref Range   Creatinine              25               mg/dL      >=20 ==================================================================== Declared Medications:  The flagging and interpretation on this report are based on the  following declared medications.  Unexpected results may arise from  inaccuracies in the declared medications.  **Note: The testing scope of this panel includes these medications:  Tramadol  **Note: The testing scope of this panel does not include following  reported medications:  Acetaminophen  Amlodipine  Brimonidine Tartrate  Cranberry Extract  Furosemide  Latanoprost  Levothyroxine  Lidocaine  Lisinopril  Metoprolol  Omeprazole  Timolol  Trazodone  Vitamin D2 ==================================================================== For clinical consultation, please call [phone]. ====================================================================    UDS interpretation: Compliant          Medication Assessment Form: Reviewed. Patient indicates being compliant with therapy Treatment compliance: Compliant Risk Assessment Profile: Aberrant behavior: See prior evaluations. None observed or detected today Comorbid factors increasing risk of overdose: See prior notes. No additional risks detected today Opioid risk tool (ORT) (Total Score): 0 Personal History of Substance Abuse  (SUD-Substance use disorder):  Alcohol: Negative  Illegal Drugs: Negative  Rx Drugs: Negative  ORT Risk Level calculation: Low Risk Risk of substance use disorder (SUD): Low Opioid Risk Tool - 04/29/18 1138      Family History of Substance Abuse   Alcohol  Negative    Illegal Drugs  Negative    Rx Drugs  Negative      Personal History of Substance Abuse   Alcohol  Negative    Illegal Drugs  Negative    Rx Drugs  Negative      Age   Age between 24-45 years   No      Psychological Disease   Psychological Disease  Negative    Depression  Negative      Total Score   Opioid Risk Tool Scoring  0    Opioid Risk Interpretation  Low Risk      ORT Scoring interpretation table:  Score <3 = Low Risk for SUD  Score between 4-7 = Moderate Risk for SUD  Score >8 = High Risk for Opioid Abuse   Risk Mitigation Strategies:  Patient Counseling: Covered Patient-Prescriber Agreement (PPA): Present and active  Notification to other healthcare providers: Done  Pharmacologic Plan: No change in therapy, at this time.             Laboratory Chemistry  Inflammation Markers (CRP: Acute Phase) (ESR: Chronic Phase) No results found for: CRP, ESRSEDRATE, LATICACIDVEN                       Rheumatology Markers No results found for: RF, ANA, LABURIC, URICUR, LYMEIGGIGMAB, LYMEABIGMQN, HLAB27                      Renal Function Markers Lab Results  Component Value Date   BUN 8 12/23/2017   CREATININE 0.81 44/69/5072   BCR NOT APPLICABLE 25/75/0518   GFRAA 76 12/23/2017   GFRNONAA 65 12/23/2017                             Hepatic Function Markers Lab Results  Component Value Date   AST 11 11/22/2016   ALT 9 11/22/2016   ALBUMIN 3.6 11/22/2016   ALKPHOS 59 11/22/2016   LIPASE 21 06/14/2016                        Electrolytes Lab Results  Component Value Date   NA 138 12/23/2017   K 4.0 12/23/2017   CL 99 12/23/2017   CALCIUM 9.8 12/23/2017   MG 2.1 01/14/2014                         Neuropathy Markers Lab Results  Component Value Date   HGBA1C 5.6 12/23/2017  CNS Tests No results found for: COLORCSF, APPEARCSF, RBCCOUNTCSF, WBCCSF, POLYSCSF, LYMPHSCSF, EOSCSF, PROTEINCSF, GLUCCSF, JCVIRUS, CSFOLI, IGGCSF                      Bone Pathology Markers No results found for: VD25OH, [id number], [id number], [id number], 25OHVITD1, 25OHVITD2, 25OHVITD3, TESTOFREE, TESTOSTERONE                       Coagulation Parameters Lab Results  Component Value Date   PLT 171 11/22/2016                        Cardiovascular Markers Lab Results  Component Value Date   CKTOTAL 65 04/23/2012   CKMB 1.4 04/23/2012   TROPONINI <0.03 11/24/2014   HGB 11.9 11/22/2016   HCT 38.8 11/22/2016                         CA Markers No results found for: CEA, CA125, LABCA2                      Note: Lab results reviewed.  Recent Diagnostic Imaging Results  AAA Duplex ABDOMINAL AORTA STUDY  Indications: Follow up exam for known AAA.  Limitations: Air/bowel gas.    Comparison Study: 02/26/2017  Performing Technologist: Charlane Ferretti RT (R)(VS)    Examination Guidelines: A complete evaluation includes B-mode imaging, spectral Doppler, color Doppler, and power Doppler as needed of all accessible portions of each vessel. Bilateral testing is considered an integral part of a complete examination. Limited examinations for reoccurring indications may be performed as noted.    Abdominal Aorta Findings: +-----------+-------+----------+----------+--------+--------+--------+ Location   AP (cm)Trans (cm)PSV (cm/s)WaveformThrombusComments +-----------+-------+----------+----------+--------+--------+--------+ Proximal   3.10   4.20      222                                +-----------+-------+----------+----------+--------+--------+--------+ Mid        2.90   2.60      127                       plaque    +-----------+-------+----------+----------+--------+--------+--------+ Distal     3.10   3.20      126                                +-----------+-------+----------+----------+--------+--------+--------+ RT CIA Prox1.4    1.8       172                       plaque   +-----------+-------+----------+----------+--------+--------+--------+ LT CIA Prox1.3    1.0       205                       plaque   +-----------+-------+----------+----------+--------+--------+--------+    Final Interpretation: Abdominal Aorta: There is evidence of abnormal dilitation of the Proximal and Distal Abdominal aorta. The largest aortic measurement is 4.2 cm. The largest aortic diameter has increased compared to prior exam. Previous diameter measurement was 2.9 cm  obtained on 02/26/2017.   *See table(s) above for measurements and observations.   Electronically signed by Leotis Pain MD on 03/10/2018 at 9:53:38 AM.    Final    Complexity Note: Imaging results  reviewed. Results shared with Ms. Leber, using Layman's terms.                         Meds   Current Outpatient Medications:  .  acetaminophen (TYLENOL) 500 MG tablet, Take 1,000 mg by mouth daily as needed for moderate pain or headache., Disp: , Rfl:  .  amLODipine (NORVASC) 5 MG tablet, TAKE 1 TABLET BY MOUTH ONCE DAILY, Disp: 90 tablet, Rfl: 3 .  ASPERCREME LIDOCAINE EX, Apply 1 application topically., Disp: , Rfl:  .  brimonidine (ALPHAGAN) 0.2 % ophthalmic solution, Place 1 drop into the left eye 2 (two) times daily. , Disp: , Rfl:  .  clonazePAM (KLONOPIN) 0.5 MG tablet, TAKE 1/2 TABLET BY MOUTH TWICE DAILY FORANXIETY, Disp: 30 tablet, Rfl: 5 .  Cranberry 250 MG CAPS, Take 250 mg by mouth daily., Disp: , Rfl:  .  Ergocalciferol (VITAMIN D2) 2000 UNITS TABS, Take 2,000 Units by mouth daily with lunch. , Disp: , Rfl:  .  furosemide (LASIX) 20 MG tablet, Take 1 tablet (20 mg total) by mouth daily as needed., Disp: 30 tablet,  Rfl: 5 .  latanoprost (XALATAN) 0.005 % ophthalmic solution, Place 1 drop into the left eye at bedtime. , Disp: , Rfl:  .  levothyroxine (SYNTHROID, LEVOTHROID) 112 MCG tablet, TAKE 1 TABLET BY MOUTH ONCE DAILY ON AN EMPTY STOMACH. WAIT 30 MINUTES BEFORE TAKING OTHER MEDS., Disp: 90 tablet, Rfl: 3 .  lisinopril (PRINIVIL,ZESTRIL) 40 MG tablet, Take 1 tablet (40 mg total) by mouth daily., Disp: 90 tablet, Rfl: 3 .  metoprolol succinate (TOPROL-XL) 100 MG 24 hr tablet, TAKE 1 TABLET BY MOUTH ONCE DAILY WITH OR IMMEDIATELY AFTER A MEAL., Disp: 30 tablet, Rfl: 5 .  omeprazole (PRILOSEC) 20 MG capsule, TAKE 1 CAPSULE BY MOUTH ONCE DAILY, Disp: 30 capsule, Rfl: 11 .  timolol (TIMOPTIC) 0.5 % ophthalmic solution, Place 1 drop into the left eye 2 (two) times daily. , Disp: , Rfl:  .  [START ON 05/20/2018] traMADol (ULTRAM) 50 MG tablet, Take 1 tablet (50 mg total) by mouth 3 (three) times daily as needed for severe pain., Disp: 90 tablet, Rfl: 5 .  traZODone (DESYREL) 50 MG tablet, TAKE 1 TABLET BY MOUTH AT BEDTIME, Disp: 30 tablet, Rfl: 11  ROS  Constitutional: Denies any fever or chills Gastrointestinal: No reported hemesis, hematochezia, vomiting, or acute GI distress Musculoskeletal: Denies any acute onset joint swelling, redness, loss of ROM, or weakness Neurological: No reported episodes of acute onset apraxia, aphasia, dysarthria, agnosia, amnesia, paralysis, loss of coordination, or loss of consciousness  Allergies  Ms. Heatley has No Known Allergies.  Central  Drug: Ms. Kliebert  reports that she does not use drugs. Alcohol:  reports that she does not drink alcohol. Tobacco:  reports that she has never smoked. She has never used smokeless tobacco. Medical:  has a past medical history of Anxiety, Back ache, Hiatal hernia, Hyperlipidemia, Hypertension, Hypothyroid, Osteopenia, Thyroid disease, and UTI (urinary tract infection). Surgical: Ms. Donati  has a past surgical history that includes  Hernia repair; Hernia repair; Hemorrhoid surgery; and Kyphoplasty (N/A, 01/23/2017). Family: family history includes Cancer in her father; Stroke in her mother.  Constitutional Exam  General appearance: Well nourished, well developed, and well hydrated. In no apparent acute distress Vitals:   04/29/18 1131  BP: (!) 171/79  Pulse: (!) 56  Resp: 18  Temp: 98.3 F (36.8 C)  TempSrc: Oral  SpO2: 94%  Weight: 125 lb (56.7 kg)  Height: '5\' 6"'$  (1.676 m)   BMI Assessment: Estimated body mass index is 20.18 kg/m as calculated from the following:   Height as of this encounter: '5\' 6"'$  (1.676 m).   Weight as of this encounter: 125 lb (56.7 kg). Psych/Mental status: Alert, oriented x 3 (person, place, & time)       Eyes: PERLA Respiratory: No evidence of acute respiratory distress  Lumbar Spine Area Exam  Skin & Axial Inspection: No masses, redness, or swelling Alignment: Symmetrical Functional ROM: Unrestricted ROM       Stability: No instability detected Muscle Tone/Strength: Functionally intact. No obvious neuro-muscular anomalies detected. Sensory (Neurological): Unimpaired Palpation: Tender         Gait & Posture Assessment  Ambulation: Unassisted Gait: Relatively normal for age and body habitus Posture: WNL   Lower Extremity Exam    Side: Right lower extremity  Side: Left lower extremity  Stability: No instability observed          Stability: No instability observed          Skin & Extremity Inspection: Skin color, temperature, and hair growth are WNL. No peripheral edema or cyanosis. No masses, redness, swelling, asymmetry, or associated skin lesions. No contractures.  Skin & Extremity Inspection: Skin color, temperature, and hair growth are WNL. No peripheral edema or cyanosis. No masses, redness, swelling, asymmetry, or associated skin lesions. No contractures.  Functional ROM: Unrestricted ROM                  Functional ROM: Unrestricted ROM                  Muscle  Tone/Strength: Functionally intact. No obvious neuro-muscular anomalies detected.  Muscle Tone/Strength: Functionally intact. No obvious neuro-muscular anomalies detected.  Sensory (Neurological): Unimpaired  Sensory (Neurological): Unimpaired  Palpation: No palpable anomalies  Palpation: No palpable anomalies   Assessment  Primary Diagnosis & Pertinent Problem List: The primary encounter diagnosis was Spondylosis without myelopathy or radiculopathy, lumbosacral region. Diagnoses of Lumbar facet syndrome (Bilateral) (R>L), Chronic pain syndrome, and Long term current use of opiate analgesic were also pertinent to this visit.  Status Diagnosis  Persistent Persistent Controlled 1. Spondylosis without myelopathy or radiculopathy, lumbosacral region   2. Lumbar facet syndrome (Bilateral) (R>L)   3. Chronic pain syndrome   4. Long term current use of opiate analgesic     Problems updated and reviewed during this visit: No problems updated. Plan of Care  Pharmacotherapy (Medications Ordered): Meds ordered this encounter  Medications  . traMADol (ULTRAM) 50 MG tablet    Sig: Take 1 tablet (50 mg total) by mouth 3 (three) times daily as needed for severe pain.    Dispense:  90 tablet    Refill:  5    Do not place this medication, or any other prescription from our practice, on "Automatic Refill". Patient may have prescription filled one day early if pharmacy is closed on scheduled refill date.    Order Specific Question:   Supervising Provider    Answer:   Milinda Pointer [[id number]]   New Prescriptions   No medications on file   Medications administered today: [name] had no medications administered during this visit. Lab-work, procedure(s), and/or referral(s): Orders Placed This Encounter  Procedures  . LUMBAR FACET(MEDIAL BRANCH NERVE BLOCK) MBNB   Imaging and/or referral(s): None  Interventional therapies: Planned, scheduled, and/or pending: Palliativebilateral  lumbar facet block    Considering:  Palliativebilateral lumbar  facet block  Possible bilateral lumbar facet radiofrequency ablation.  Diagnostic right-sided intra-articular hip joint injection Palliative right-sided L3-4 lumbar epidural steroid injection Diagnostic right-sided transforaminal L3-4 epidural steroid injection Diagnostic right-sided trochanteric bursa injection Diagnostic right-sided sacroiliac joint block   Palliative PRN treatment(s):  Palliativebilateral lumbar facet block  Diagnostic right-sided intra-articular hip joint injection Palliative right-sided L3-4 lumbar epidural steroid injection Diagnostic right-sided transforaminal L3-4 epidural steroid injection Diagnostic right-sided trochanteric bursa injection Diagnostic right-sided sacroiliac joint block    Provider-requested follow-up: Return in about 6 months (around 10/29/2018) for MedMgmt, in addition, w/ Dr. Dossie Arbour, Procedure(NS).  Future Appointments  Date Time Provider [place]  05/05/2018  8:00 AM Milinda Pointer, MD [facility] None  06/16/2018  9:00 AM Olin Hauser, DO [place] None  09/15/2018  8:30 AM [facility] None  09/15/2018  9:45 AM Dew, Erskine Squibb, MD [facility] None  11/04/2018 11:00 AM Vevelyn Francois, NP [facility] None   Primary Care Physician: Olin Hauser, DO Location: [facility] Outpatient [facility] Management Facility Note by: Vevelyn Francois NP Date: 04/29/2018; Time: 2:32 PM  Pain Score Disclaimer: We use the NRS-11 scale. This is a self-reported, subjective measurement of pain severity with only modest accuracy. It is used primarily to identify changes within a particular patient. It must be understood that outpatient pain scales are significantly less accurate that those used for research, where they can be applied under ideal controlled circumstances with minimal exposure to variables. In reality, the score is likely to be a combination of  pain intensity and pain affect, where pain affect describes the degree of emotional arousal or changes in action readiness caused by the sensory experience of pain. Factors such as social and work situation, setting, emotional state, anxiety levels, expectation, and prior pain experience may influence pain perception and show large inter-individual differences that may also be affected by time variables.  Patient instructions provided during this appointment: Patient Instructions   Rx for Tramadol to last until 11/16/2018 has been escribed to your pharmacy.    Moderate Conscious Sedation, Adult Sedation is the use of medicines to promote relaxation and relieve discomfort and anxiety. Moderate conscious sedation is a type of sedation. Under moderate conscious sedation, you are less alert than normal, but you are still able to respond to instructions, touch, or both. Moderate conscious sedation is used during short medical and dental procedures. It is milder than deep sedation, which is a type of sedation under which you cannot be easily woken up. It is also milder than general anesthesia, which is the use of medicines to make you unconscious. Moderate conscious sedation allows you to return to your regular activities sooner. Tell a health care provider about:  Any allergies you have.  All medicines you are taking, including vitamins, herbs, eye drops, creams, and over-the-counter medicines.  Use of steroids (by mouth or creams).  Any problems you or family members have had with sedatives and anesthetic medicines.  Any blood disorders you have.  Any surgeries you have had.  Any medical conditions you have, such as sleep apnea.  Whether you are pregnant or may be pregnant.  Any use of cigarettes, alcohol, marijuana, or street drugs. What are the risks? Generally, this is a safe procedure. However, problems may occur, including:  Getting too much medicine  (oversedation).  Nausea.  Allergic reaction to medicines.  Trouble breathing. If this happens, a breathing tube may be used to help with breathing. It will be removed when you are  awake and breathing on your own.  Heart trouble.  Lung trouble.  What happens before the procedure? Staying hydrated Follow instructions from your health care provider about hydration, which may include:  Up to 2 hours before the procedure - you may continue to drink clear liquids, such as water, clear fruit juice, black coffee, and plain tea.  Eating and drinking restrictions Follow instructions from your health care provider about eating and drinking, which may include:  8 hours before the procedure - stop eating heavy meals or foods such as meat, fried foods, or fatty foods.  6 hours before the procedure - stop eating light meals or foods, such as toast or cereal.  6 hours before the procedure - stop drinking milk or drinks that contain milk.  2 hours before the procedure - stop drinking clear liquids.  Medicine  Ask your health care provider about:  Changing or stopping your regular medicines. This is especially important if you are taking diabetes medicines or blood thinners.  Taking medicines such as aspirin and ibuprofen. These medicines can thin your blood. Do not take these medicines before your procedure if your health care provider instructs you not to.  Tests and exams  You will have a physical exam.  You may have blood tests done to show: ? How well your kidneys and liver are working. ? How well your blood can clot. General instructions  Plan to have someone take you home from the hospital or clinic.  If you will be going home right after the procedure, plan to have someone with you for 24 hours. What happens during the procedure?  An IV tube will be inserted into one of your veins.  Medicine to help you relax (sedative) will be given through the IV tube.  The medical or  dental procedure will be performed. What happens after the procedure?  Your blood pressure, heart rate, breathing rate, and blood oxygen level will be monitored often until the medicines you were given have worn off.  Do not drive for 24 hours. This information is not intended to replace advice given to you by your health care provider. Make sure you discuss any questions you have with your health care provider. Document Released: 04/09/2001 Document Revised: 12/19/2015 Document Reviewed: 11/04/2015 Elsevier Interactive Patient Education  2018 [place]  What are the risk, side effects and possible complications? Generally speaking, most procedures are safe.  However, with any procedure there are risks, side effects, and the possibility of complications.  The risks and complications are dependent upon the sites that are lesioned, or the type of nerve block to be performed.  The closer the procedure is to the spine, the more serious the risks are.  Great care is taken when placing the radio frequency needles, block needles or lesioning probes, but sometimes complications can occur. 1. Infection: Any time there is an injection through the skin, there is a risk of infection.  This is why sterile conditions are used for these blocks.  There are four possible types of infection. 1. Localized skin infection. 2. Central Nervous System Infection-This can be in the form of Meningitis, which can be deadly. 3. Epidural Infections-This can be in the form of an epidural abscess, which can cause pressure inside of the spine, causing compression of the spinal cord with subsequent paralysis. This would require an emergency surgery to decompress, and there are no guarantees that the patient would recover from the paralysis. 4. Discitis-This is  an infection of the intervertebral discs.  It occurs in about 1% of discography procedures.  It is difficult to treat and it may lead to  surgery.        2. Pain: the needles have to go through skin and soft tissues, will cause soreness.       3. Damage to internal structures:  The nerves to be lesioned may be near blood vessels or    other nerves which can be potentially damaged.       4. Bleeding: Bleeding is more common if the patient is taking blood thinners such as  aspirin, Coumadin, Ticiid, Plavix, etc., or if he/she have some genetic predisposition  such as hemophilia. Bleeding into the spinal canal can cause compression of the spinal  cord with subsequent paralysis.  This would require an emergency surgery to  decompress and there are no guarantees that the patient would recover from the  paralysis.       5. Pneumothorax:  Puncturing of a lung is a possibility, every time a needle is introduced in  the area of the chest or upper back.  Pneumothorax refers to free air around the  collapsed lung(s), inside of the thoracic cavity (chest cavity).  Another two possible  complications related to a similar event would include: Hemothorax and Chylothorax.   These are variations of the Pneumothorax, where instead of air around the collapsed  lung(s), you may have blood or chyle, respectively.       6. Spinal headaches: They may occur with any procedures in the area of the spine.       7. Persistent CSF (Cerebro-Spinal Fluid) leakage: This is a rare problem, but may occur  with prolonged intrathecal or epidural catheters either due to the formation of a fistulous  track or a dural tear.       8. Nerve damage: By working so close to the spinal cord, there is always a possibility of  nerve damage, which could be as serious as a permanent spinal cord injury with  paralysis.       9. Death:  Although rare, severe deadly allergic reactions known as "Anaphylactic  reaction" can occur to any of the medications used.      10. Worsening of the symptoms:  We can always make thing worse.  What are the chances of something like this  happening? Chances of any of this occuring are extremely low.  By statistics, you have more of a chance of getting killed in a motor vehicle accident: while driving to the hospital than any of the above occurring .  Nevertheless, you should be aware that they are possibilities.  In general, it is similar to taking a shower.  Everybody knows that you can slip, hit your head and get killed.  Does that mean that you should not shower again?  Nevertheless always keep in mind that statistics do not mean anything if you happen to be on the wrong side of them.  Even if a procedure has a 1 (one) in a 1,000,000 (million) chance of going wrong, it you happen to be that one..Also, keep in mind that by statistics, you have more of a chance of having something go wrong when taking medications.  Who should not have this procedure? If you are on a blood thinning medication (e.g. Coumadin, Plavix, see list of "Blood Thinners"), or if you have an active infection going on, you should not have the procedure.  If you are taking  any blood thinners, please inform your physician.  How should I prepare for this procedure?  Do not eat or drink anything at least six hours prior to the procedure.  Bring a driver with you .  It cannot be a taxi.  Come accompanied by an adult that can drive you back, and that is strong enough to help you if your legs get weak or numb from the local anesthetic.  Take all of your medicines the morning of the procedure with just enough water to swallow them.  If you have diabetes, make sure that you are scheduled to have your procedure done first thing in the morning, whenever possible.  If you have diabetes, take only half of your insulin dose and notify our nurse that you have done so as soon as you arrive at the clinic.  If you are diabetic, but only take blood sugar pills (oral hypoglycemic), then do not take them on the morning of your procedure.  You may take them after you have had the  procedure.  Do not take aspirin or any aspirin-containing medications, at least eleven (11) days prior to the procedure.  They may prolong bleeding.  Wear loose fitting clothing that may be easy to take off and that you would not mind if it got stained with Betadine or blood.  Do not wear any jewelry or perfume  Remove any nail coloring.  It will interfere with some of our monitoring equipment.  NOTE: Remember that this is not meant to be interpreted as a complete list of all possible complications.  Unforeseen problems may occur.  BLOOD THINNERS The following drugs contain aspirin or other products, which can cause increased bleeding during surgery and should not be taken for 2 weeks prior to and 1 week after surgery.  If you should need take something for relief of minor pain, you may take acetaminophen which is found in Tylenol,m Datril, Anacin-3 and Panadol. It is not blood thinner. The products listed below are.  Do not take any of the products listed below in addition to any listed on your instruction sheet.  A.P.C or A.P.C with Codeine Codeine Phosphate Capsules #3 Ibuprofen Ridaura  ABC compound Congesprin Imuran rimadil  Advil Cope Indocin Robaxisal  Alka-Seltzer Effervescent Pain Reliever and Antacid Coricidin or Coricidin-D  Indomethacin Rufen  Alka-Seltzer plus Cold Medicine Cosprin Ketoprofen S-A-C Tablets  Anacin Analgesic Tablets or Capsules Coumadin Korlgesic Salflex  Anacin Extra Strength Analgesic tablets or capsules CP-2 Tablets Lanoril Salicylate  Anaprox Cuprimine Capsules Levenox Salocol  Anexsia-D Dalteparin Magan Salsalate  Anodynos Darvon compound Magnesium Salicylate Sine-off  Ansaid Dasin Capsules Magsal Sodium Salicylate  Anturane Depen Capsules Marnal Soma  APF Arthritis pain formula Dewitt's Pills Measurin Stanback  Argesic Dia-Gesic Meclofenamic Sulfinpyrazone  Arthritis Bayer Timed Release Aspirin Diclofenac Meclomen Sulindac  Arthritis pain formula  Anacin Dicumarol Medipren Supac  Analgesic (Safety coated) Arthralgen Diffunasal Mefanamic Suprofen  Arthritis Strength Bufferin Dihydrocodeine Mepro Compound Suprol  Arthropan liquid Dopirydamole Methcarbomol with Aspirin Synalgos  ASA tablets/Enseals Disalcid Micrainin Tagament  Ascriptin Doan's Midol Talwin  Ascriptin A/D Dolene Mobidin Tanderil  Ascriptin Extra Strength Dolobid Moblgesic Ticlid  Ascriptin with Codeine Doloprin or Doloprin with Codeine Momentum Tolectin  Asperbuf Duoprin Mono-gesic Trendar  Aspergum Duradyne Motrin or Motrin IB Triminicin  Aspirin plain, buffered or enteric coated Durasal Myochrisine Trigesic  Aspirin Suppositories Easprin Nalfon Trillsate  Aspirin with Codeine Ecotrin Regular or Extra Strength Naprosyn Uracel  Atromid-S Efficin Naproxen Ursinus  Auranofin Capsules Elmiron Neocylate  Vanquish  Axotal Emagrin Norgesic Verin  Azathioprine Empirin or Empirin with Codeine Normiflo Vitamin E  Azolid Emprazil Nuprin Voltaren  Bayer Aspirin plain, buffered or children's or timed BC Tablets or powders Encaprin Orgaran Warfarin Sodium  Buff-a-Comp Enoxaparin Orudis Zorpin  Buff-a-Comp with Codeine Equegesic Os-Cal-Gesic   Buffaprin Excedrin plain, buffered or Extra Strength Oxalid   Bufferin Arthritis Strength Feldene Oxphenbutazone   Bufferin plain or Extra Strength Feldene Capsules Oxycodone with Aspirin   Bufferin with Codeine Fenoprofen Fenoprofen Pabalate or Pabalate-SF   Buffets II Flogesic Panagesic   Buffinol plain or Extra Strength Florinal or Florinal with Codeine Panwarfarin   Buf-Tabs Flurbiprofen Penicillamine   Butalbital Compound Four-way cold tablets Penicillin   Butazolidin Fragmin Pepto-Bismol   Carbenicillin Geminisyn Percodan   Carna Arthritis Reliever Geopen Persantine   Carprofen Gold's salt Persistin   Chloramphenicol Goody's Phenylbutazone   Chloromycetin Haltrain Piroxlcam   Clmetidine heparin Plaquenil   Cllnoril Hyco-pap  Ponstel   Clofibrate Hydroxy chloroquine Propoxyphen         Before stopping any of these medications, be sure to consult the physician who ordered them.  Some, such as Coumadin (Warfarin) are ordered to prevent or treat serious conditions such as "deep thrombosis", "pumonary embolisms", and other heart problems.  The amount of time that you may need off of the medication may also vary with the medication and the reason for which you were taking it.  If you are taking any of these medications, please make sure you notify your pain physician before you undergo any procedures.         Facet Blocks Patient Information  Description: The facets are joints in the spine between the vertebrae.  Like any joints in the body, facets can become irritated and painful.  Arthritis can also effect the facets.  By injecting steroids and local anesthetic in and around these joints, we can temporarily block the nerve supply to them.  Steroids act directly on irritated nerves and tissues to reduce selling and inflammation which often leads to decreased pain.  Facet blocks may be done anywhere along the spine from the neck to the low back depending upon the location of your pain.   After numbing the skin with local anesthetic (like Novocaine), a small needle is passed onto the facet joints under x-ray guidance.  You may experience a sensation of pressure while this is being done.  The entire block usually lasts about 15-25 minutes.   Conditions which may be treated by facet blocks:   Low back/buttock pain  Neck/shoulder pain  Certain types of headaches  Preparation for the injection:  1. Do not eat any solid food or dairy products within 8 hours of your appointment. 2. You may drink clear liquid up to 3 hours before appointment.  Clear liquids include water, black coffee, juice or soda.  No milk or cream please. 3. You may take your regular medication, including pain medications, with a sip of water  before your appointment.  Diabetics should hold regular insulin (if taken separately) and take 1/2 normal NPH dose the morning of the procedure.  Carry some sugar containing items with you to your appointment. 4. A driver must accompany you and be prepared to drive you home after your procedure. 5. Bring all your current medications with you. 6. An IV may be inserted and sedation may be given at the discretion of the physician. 7. A blood pressure cuff, EKG and other monitors will often be applied during the  procedure.  Some patients may need to have extra oxygen administered for a short period. 8. You will be asked to provide medical information, including your allergies and medications, prior to the procedure.  We must know immediately if you are taking blood thinners (like Coumadin/Warfarin) or if you are allergic to IV iodine contrast (dye).  We must know if you could possible be pregnant.  Possible side-effects:   Bleeding from needle site  Infection (rare, may require surgery)  Nerve injury (rare)  Numbness & tingling (temporary)  Difficulty urinating (rare, temporary)  Spinal headache (a headache worse with upright posture)  Light-headedness (temporary)  Pain at injection site (serveral days)  Decreased blood pressure (rare, temporary)  Weakness in arm/leg (temporary)  Pressure sensation in back/neck (temporary)   Call if you experience:   Fever/chills associated with headache or increased back/neck pain  Headache worsened by an upright position  New onset, weakness or numbness of an extremity below the injection site  Hives or difficulty breathing (go to the emergency room)  Inflammation or drainage at the injection site(s)  Severe back/neck pain greater than usual  New symptoms which are concerning to you  Please note:  Although the local anesthetic injected can often make your back or neck feel good for several hours after the injection, the pain will  likely return. It takes 3-7 days for steroids to work.  You may not notice any pain relief for at least one week.  If effective, we will often do a series of 2-3 injections spaced 3-6 weeks apart to maximally decrease your pain.  After the initial series, you may be a candidate for a more permanent nerve block of the facets.  If you have any questions, please call #[phone]) [facility]____________________________________________________________________________________________  Medication Rules  Applies to: All patients receiving prescriptions (written or electronic).  Pharmacy of record: Pharmacy where electronic prescriptions will be sent. If written prescriptions are taken to a different pharmacy, please inform the nursing staff. The pharmacy listed in the electronic medical record should be the one where you would like electronic prescriptions to be sent.  Prescription refills: Only during scheduled appointments. Applies to both, written and electronic prescriptions.  NOTE: The following applies primarily to controlled substances (Opioid* Pain Medications).   Patient's responsibilities: 103. Pain Pills: Bring all pain pills to every appointment (except for procedure appointments). 24. Pill Bottles: Bring pills in original pharmacy bottle. Always bring newest bottle. Bring bottle, even if empty. 25. Medication refills: You are responsible for knowing and keeping track of what medications you need refilled. The day before your appointment, write a list of all prescriptions that need to be refilled. Bring that list to your appointment and give it to the admitting nurse. Prescriptions will be written only during appointments. If you forget a medication, it will not be "Called in", "Faxed", or "electronically sent". You will need to get another appointment to get these prescribed. 26. Prescription Accuracy: You are responsible for carefully inspecting your  prescriptions before leaving our office. Have the discharge nurse carefully go over each prescription with you, before taking them home. Make sure that your name is accurately spelled, that your address is correct. Check the name and dose of your medication to make sure it is accurate. Check the number of pills, and the written instructions to make sure they are clear and accurate. Make sure that you are given enough medication to last until your next medication refill appointment. 27. Taking  Medication: Take medication as prescribed. Never take more pills than instructed. Never take medication more frequently than prescribed. Taking less pills or less frequently is permitted and encouraged, when it comes to controlled substances (written prescriptions).  28. Inform other Doctors: Always inform, all of your healthcare providers, of all the medications you take. 29. Pain Medication from other Providers: You are not allowed to accept any additional pain medication from any other Doctor or Healthcare provider. There are two exceptions to this rule. (see below) In the event that you require additional pain medication, you are responsible for notifying us, as stated below. 30. Medication Agreement: You are responsible for carefully reading and following our Medication Agreement. This must be signed before receiving any prescriptions from our practice. Safely store a copy of your signed Agreement. Violations to the Agreement will result in no further prescriptions. (Additional copies of our Medication Agreement are available upon request.) 31. Laws, Rules, & Regulations: All patients are expected to follow all Federal and [employer], Rules, [employer]. Ignorance of the Laws does not constitute a valid excuse. The use of any illegal substances is prohibited. 62. Adopted CDC guidelines & recommendations: Target dosing levels will be at or below 60 MME/day. Use of benzodiazepines** is not  recommended.  Exceptions: There are only two exceptions to the rule of not receiving pain medications from other Healthcare Providers. 1. Exception #1 (Emergencies): In the event of an emergency (i.e.: accident requiring emergency care), you are allowed to receive additional pain medication. However, you are responsible for: As soon as you are able, call our office [phone], at any time of the day or night, and leave a message stating your name, the date and nature of the emergency, and the name and dose of the medication prescribed. In the event that your call is answered by a member of our staff, make sure to document and save the date, time, and the name of the person that took your information.  2. Exception #2 (Planned Surgery): In the event that you are scheduled by another doctor or dentist to have any type of surgery or procedure, you are allowed (for a period no longer than 30 days), to receive additional pain medication, for the acute post-op pain. However, in this case, you are responsible for picking up a copy of our "Post-op Pain Management for Surgeons" handout, and giving it to your surgeon or dentist. This document is available at our office, and does not require an appointment to obtain it. Simply go to our office during business hours (Monday-Thursday from 8:00 AM to 4:00 PM) (Friday 8:00 AM to 12:00 Noon) or if you have a scheduled appointment with [place], prior to your surgery, and ask for it by name. In addition, you will need to provide [place] with your name, name of your surgeon, type of surgery, and date of procedure or surgery.  *Opioid medications include: morphine, codeine, oxycodone, oxymorphone, hydrocodone, hydromorphone, meperidine, tramadol, tapentadol, buprenorphine, fentanyl, methadone. **Benzodiazepine medications include: diazepam (Valium), alprazolam (Xanax), clonazepam (Klonopine), lorazepam (Ativan), clorazepate (Tranxene), chlordiazepoxide (Librium), estazolam (Prosom),  oxazepam (Serax), temazepam (Restoril), triazolam (Halcion) (Last updated: 09/25/2017) ____________________________________________________________________________________________

## 2018-05-05 ENCOUNTER — Ambulatory Visit
Admission: RE | Admit: 2018-05-05 | Discharge: 2018-05-05 | Disposition: A | Discharge status: Home / Self Care | Payer: Medicare Other | Source: Ambulatory Visit | Attending: Pain Medicine | Admitting: Pain Medicine

## 2018-05-05 ENCOUNTER — Ambulatory Visit (HOSPITAL_BASED_OUTPATIENT_CLINIC_OR_DEPARTMENT_OTHER): Payer: Medicare Other | Admitting: Pain Medicine

## 2018-05-05 ENCOUNTER — Encounter: Payer: Self-pay | Admitting: Pain Medicine

## 2018-05-05 ENCOUNTER — Other Ambulatory Visit: Payer: Self-pay

## 2018-05-05 VITALS — BP 155/73 | HR 63 | Temp 98.3°F | Resp 18 | Ht 66.0 in | Wt 125.0 lb

## 2018-05-05 DIAGNOSIS — M47817 Spondylosis without myelopathy or radiculopathy, lumbosacral region: Secondary | ICD-10-CM

## 2018-05-05 DIAGNOSIS — M545 Low back pain, unspecified: Secondary | ICD-10-CM

## 2018-05-05 DIAGNOSIS — G8929 Other chronic pain: Secondary | ICD-10-CM | POA: Diagnosis not present

## 2018-05-05 DIAGNOSIS — M47816 Spondylosis without myelopathy or radiculopathy, lumbar region: Secondary | ICD-10-CM | POA: Diagnosis not present

## 2018-05-05 DIAGNOSIS — R937 Abnormal findings on diagnostic imaging of other parts of musculoskeletal system: Secondary | ICD-10-CM | POA: Insufficient documentation

## 2018-05-05 MED ORDER — TRIAMCINOLONE ACETONIDE 40 MG/ML IJ SUSP
INTRAMUSCULAR | Status: AC
  Filled 2018-05-05: qty 2

## 2018-05-05 MED ORDER — TRIAMCINOLONE ACETONIDE 40 MG/ML IJ SUSP
80.0000 mg | Freq: Once | INTRAMUSCULAR | Status: AC
  Administered 2018-05-05: 80 mg

## 2018-05-05 MED ORDER — LIDOCAINE HCL 2 % IJ SOLN
INTRAMUSCULAR | Status: AC
  Filled 2018-05-05: qty 20

## 2018-05-05 MED ORDER — ROPIVACAINE HCL 2 MG/ML IJ SOLN
INTRAMUSCULAR | Status: AC
  Filled 2018-05-05: qty 20

## 2018-05-05 MED ORDER — LIDOCAINE HCL 2 % IJ SOLN
20.0000 mL | Freq: Once | INTRAMUSCULAR | Status: AC
  Administered 2018-05-05: 400 mg

## 2018-05-05 MED ORDER — ROPIVACAINE HCL 2 MG/ML IJ SOLN
18.0000 mL | Freq: Once | INTRAMUSCULAR | Status: AC
  Administered 2018-05-05: 18 mL via PERINEURAL

## 2018-05-05 NOTE — Patient Instructions (Signed)

## 2018-05-05 NOTE — Progress Notes (Signed)
Patient's Name: Brittney Simpson  MRN: [id number]  Referring Provider: Nobie Putnam *  DOB: [birth year]  PCP: Olin Hauser, DO  DOS: 05/05/2018  Note by: Gaspar Cola, MD  Service setting: Ambulatory outpatient  Specialty: Interventional Pain Management  Patient type: Established  Location: [facility] (AMB) Pain Management Facility  Visit type: Interventional Procedure   Primary Reason for Visit: Interventional Pain Management Treatment. CC: Back Pain (lower)  Procedure:          Anesthesia, Analgesia, Anxiolysis:  Type: Lumbar Facet, Medial Branch Block(s) #6  Primary Purpose: Palliative Region: Posterolateral Lumbosacral Spine Level: L2, L3, L4, L5, & S1 Medial Branch Level(s). Injecting these levels blocks the L3-4, L4-5, and L5-S1 lumbar facet joints. Laterality: Bilateral  Type: Local Anesthesia Indication(s): Analgesia         Route: Infiltration ([place]/[facility]) IV Access: Declined Sedation: Declined  Local Anesthetic: Lidocaine 1-2%  Position: Prone   Indications: 1. Spondylosis without myelopathy or radiculopathy, lumbosacral region   2. Lumbar facet syndrome (Bilateral) (R>L)   3. Chronic low back pain (Bilateral) (R>L)   4. Abnormal MRI, lumbar spine (01/10/2017)    Pain Score: Pre-procedure: 8 /10 Post-procedure: 0-No pain/10  Pre-op Assessment:  Brittney Simpson is a 84 y.o. (year old), female patient, seen today for interventional treatment. She  has a past surgical history that includes Hernia repair; Hernia repair; Hemorrhoid surgery; and Kyphoplasty (N/A, 01/23/2017). Brittney Simpson has a current medication list which includes the following prescription(s): acetaminophen, amlodipine, lidocaine, brimonidine, clonazepam, cranberry, vitamin d2, furosemide, latanoprost, levothyroxine, lisinopril, metoprolol succinate, omeprazole, timolol, tramadol, and trazodone. Her primarily concern today is the Back Pain (lower)  Initial Vital Signs:  Pulse/HCG Rate: 63ECG  Heart Rate: 69 Temp: 98.3 F (36.8 C) Resp: 16 BP: (!) 185/77 SpO2: 100 %  BMI: Estimated body mass index is 20.18 kg/m as calculated from the following:   Height as of this encounter: 5\' 6"  (1.676 m).   Weight as of this encounter: 125 lb (56.7 kg).  Risk Assessment: Allergies: Reviewed. She has No Known Allergies.  Allergy Precautions: None required Coagulopathies: Reviewed. None identified.  Blood-thinner therapy: None at this time Active Infection(s): Reviewed. None identified. Brittney Simpson is afebrile  Site Confirmation: Brittney Simpson was asked to confirm the procedure and laterality before marking the site Procedure checklist: Completed Consent: Before the procedure and under the influence of no sedative(s), amnesic(s), or anxiolytics, the patient was informed of the treatment options, risks and possible complications. To fulfill our ethical and legal obligations, as recommended by the American Medical Association's Code of Ethics, I have informed the patient of my clinical impression; the nature and purpose of the treatment or procedure; the risks, benefits, and possible complications of the intervention; the alternatives, including doing nothing; the risk(s) and benefit(s) of the alternative treatment(s) or procedure(s); and the risk(s) and benefit(s) of doing nothing. The patient was provided information about the general risks and possible complications associated with the procedure. These may include, but are not limited to: failure to achieve desired goals, infection, bleeding, organ or nerve damage, allergic reactions, paralysis, and death. In addition, the patient was informed of those risks and complications associated to Spine-related procedures, such as failure to decrease pain; infection (i.e.: Meningitis, epidural or intraspinal abscess); bleeding (i.e.: epidural hematoma, subarachnoid hemorrhage, or any other type of intraspinal or peri-dural bleeding); organ or nerve damage  (i.e.: Any type of peripheral nerve, nerve root, or spinal cord injury) with subsequent damage to sensory, motor, and/or autonomic systems,  resulting in permanent pain, numbness, and/or weakness of one or several areas of the body; allergic reactions; (i.e.: anaphylactic reaction); and/or death. Furthermore, the patient was informed of those risks and complications associated with the medications. These include, but are not limited to: allergic reactions (i.e.: anaphylactic or anaphylactoid reaction(s)); adrenal axis suppression; blood sugar elevation that in diabetics may result in ketoacidosis or comma; water retention that in patients with history of congestive heart failure may result in shortness of breath, pulmonary edema, and decompensation with resultant heart failure; weight gain; swelling or edema; medication-induced neural toxicity; particulate matter embolism and blood vessel occlusion with resultant organ, and/or nervous system infarction; and/or aseptic necrosis of one or more joints. Finally, the patient was informed that Medicine is not an exact science; therefore, there is also the possibility of unforeseen or unpredictable risks and/or possible complications that may result in a catastrophic outcome. The patient indicated having understood very clearly. We have given the patient no guarantees and we have made no promises. Enough time was given to the patient to ask questions, all of which were answered to the patient's satisfaction. Brittney Simpson has indicated that she wanted to continue with the procedure. Attestation: I, the ordering provider, attest that I have discussed with the patient the benefits, risks, side-effects, alternatives, likelihood of achieving goals, and potential problems during recovery for the procedure that I have provided informed consent. Date  Time: 05/05/2018  8:25 AM  Pre-Procedure Preparation:  Monitoring: As per clinic protocol. Respiration, ETCO2, SpO2, BP,  heart rate and rhythm monitor placed and checked for adequate function Safety Precautions: Patient was assessed for positional comfort and pressure points before starting the procedure. Time-out: I initiated and conducted the "Time-out" before starting the procedure, as per protocol. The patient was asked to participate by confirming the accuracy of the "Time Out" information. Verification of the correct person, site, and procedure were performed and confirmed by me, the nursing staff, and the patient. "Time-out" conducted as per Joint Commission's Universal Protocol (UP.01.01.01). Time: 0913  Description of Procedure:          Laterality: Bilateral. The procedure was performed in identical fashion on both sides. Levels:  L2, L3, L4, L5, & S1 Medial Branch Level(s) Area Prepped: Posterior Lumbosacral Region Prepping solution: ChloraPrep (2% chlorhexidine gluconate and 70% isopropyl alcohol) Safety Precautions: Aspiration looking for blood return was conducted prior to all injections. At no point did we inject any substances, as a needle was being advanced. Before injecting, the patient was told to immediately notify me if she was experiencing any new onset of "ringing in the ears, or metallic taste in the mouth". No attempts were made at seeking any paresthesias. Safe injection practices and needle disposal techniques used. Medications properly checked for expiration dates. SDV (single dose vial) medications used. After the completion of the procedure, all disposable equipment used was discarded in the proper designated medical waste containers. Local Anesthesia: Protocol guidelines were followed. The patient was positioned over the fluoroscopy table. The area was prepped in the usual manner. The time-out was completed. The target area was identified using fluoroscopy. A 12-in long, straight, sterile hemostat was used with fluoroscopic guidance to locate the targets for each level blocked. Once located,  the skin was marked with an approved surgical skin marker. Once all sites were marked, the skin (epidermis, dermis, and hypodermis), as well as deeper tissues (fat, connective tissue and muscle) were infiltrated with a small amount of a short-acting local anesthetic, loaded on a  10cc syringe with a 25G, 1.5-in  Needle. An appropriate amount of time was allowed for local anesthetics to take effect before proceeding to the next step. Local Anesthetic: Lidocaine 2.0% The unused portion of the local anesthetic was discarded in the proper designated containers. Technical explanation of process:  L2 Medial Branch Nerve Block (MBB): The target area for the L2 medial branch is at the junction of the postero-lateral aspect of the superior articular process and the superior, posterior, and medial edge of the transverse process of L3. Under fluoroscopic guidance, a Quincke needle was inserted until contact was made with os over the superior postero-lateral aspect of the pedicular shadow (target area). After negative aspiration for blood, 0.5 mL of the nerve block solution was injected without difficulty or complication. The needle was removed intact. L3 Medial Branch Nerve Block (MBB): The target area for the L3 medial branch is at the junction of the postero-lateral aspect of the superior articular process and the superior, posterior, and medial edge of the transverse process of L4. Under fluoroscopic guidance, a Quincke needle was inserted until contact was made with os over the superior postero-lateral aspect of the pedicular shadow (target area). After negative aspiration for blood, 0.5 mL of the nerve block solution was injected without difficulty or complication. The needle was removed intact. L4 Medial Branch Nerve Block (MBB): The target area for the L4 medial branch is at the junction of the postero-lateral aspect of the superior articular process and the superior, posterior, and medial edge of the transverse  process of L5. Under fluoroscopic guidance, a Quincke needle was inserted until contact was made with os over the superior postero-lateral aspect of the pedicular shadow (target area). After negative aspiration for blood, 0.5 mL of the nerve block solution was injected without difficulty or complication. The needle was removed intact. L5 Medial Branch Nerve Block (MBB): The target area for the L5 medial branch is at the junction of the postero-lateral aspect of the superior articular process and the superior, posterior, and medial edge of the sacral ala. Under fluoroscopic guidance, a Quincke needle was inserted until contact was made with os over the superior postero-lateral aspect of the pedicular shadow (target area). After negative aspiration for blood, 0.5 mL of the nerve block solution was injected without difficulty or complication. The needle was removed intact. S1 Medial Branch Nerve Block (MBB): The target area for the S1 medial branch is at the posterior and inferior 6 o'clock position of the L5-S1 facet joint. Under fluoroscopic guidance, the Quincke needle inserted for the L5 MBB was redirected until contact was made with os over the inferior and postero aspect of the sacrum, at the 6 o' clock position under the L5-S1 facet joint (Target area). After negative aspiration for blood, 0.5 mL of the nerve block solution was injected without difficulty or complication. The needle was removed intact. Procedural Needles: 22-gauge, 3.5-inch, Quincke needles used for all levels. Nerve block solution: 0.2% PF-Ropivacaine + Triamcinolone (40 mg/mL) diluted to a final concentration of 4 mg of Triamcinolone/mL of Ropivacaine The unused portion of the solution was discarded in the proper designated containers.  Once the entire procedure was completed, the treated area was cleaned, making sure to leave some of the prepping solution back to take advantage of its long term bactericidal properties.   Illustration  of the posterior view of the lumbar spine and the posterior neural structures. Laminae of L2 through S1 are labeled. DPRL5, dorsal primary ramus of L5; DPRS1, dorsal  primary ramus of S1; DPR3, dorsal primary ramus of L3; FJ, facet (zygapophyseal) joint L3-L4; I, inferior articular process of L4; LB1, lateral branch of dorsal primary ramus of L1; IAB, inferior articular branches from L3 medial branch (supplies L4-L5 facet joint); IBP, intermediate branch plexus; MB3, medial branch of dorsal primary ramus of L3; NR3, third lumbar nerve root; S, superior articular process of L5; SAB, superior articular branches from L4 (supplies L4-5 facet joint also); TP3, transverse process of L3.  Vitals:   05/05/18 0914 05/05/18 0919 05/05/18 0924 05/05/18 0928  BP: (!) 177/84 (!) 180/77 (!) 181/80 (!) 155/73  Pulse:      Resp: 11 15 14 18   Temp:      TempSrc:      SpO2: 96% 95% 94% 93%  Weight:      Height:         Start Time: 0913 hrs. End Time: 0926 hrs.  Imaging Guidance (Spinal):          Type of Imaging Technique: Fluoroscopy Guidance (Spinal) Indication(s): Assistance in needle guidance and placement for procedures requiring needle placement in or near specific anatomical locations not easily accessible without such assistance. Exposure Time: Please see nurses notes. Contrast: None used. Fluoroscopic Guidance: I was personally present during the use of fluoroscopy. "Tunnel Vision Technique" used to obtain the best possible view of the target area. Parallax error corrected before commencing the procedure. "Direction-depth-direction" technique used to introduce the needle under continuous pulsed fluoroscopy. Once target was reached, antero-posterior, oblique, and lateral fluoroscopic projection used confirm needle placement in all planes. Images permanently stored in EMR. Interpretation: No contrast injected. I personally interpreted the imaging intraoperatively. Adequate needle placement confirmed in  multiple planes. Permanent images saved into the patient's record.  Antibiotic Prophylaxis:   Anti-infectives (From admission, onward)   None     Indication(s): None identified  Post-operative Assessment:  Post-procedure Vital Signs:  Pulse/HCG Rate: 6375 Temp: 98.3 F (36.8 C) Resp: 18 BP: (!) 155/73 SpO2: 93 %  EBL: None  Complications: No immediate post-treatment complications observed by team, or reported by patient.  Note: The patient tolerated the entire procedure well. A repeat set of vitals were taken after the procedure and the patient was kept under observation following institutional policy, for this type of procedure. Post-procedural neurological assessment was performed, showing return to baseline, prior to discharge. The patient was provided with post-procedure discharge instructions, including a section on how to identify potential problems. Should any problems arise concerning this procedure, the patient was given instructions to immediately contact us, at any time, without hesitation. In any case, we plan to contact the patient by telephone for a follow-up status report regarding this interventional procedure.  Comments:  No additional relevant information.  Plan of Care   Interventional management options: Planned, scheduled, and/or pending:   PRN palliative bilateral lumbar facet blocks under fluoroscopic guidance, no sedation.  They usually last 4 to 6 months for her.   Considering:   Palliativebilateral lumbar facet block  Possible bilateral lumbar facet radiofrequency ablation.  Diagnostic right-sided intra-articular hip joint injection Palliative right-sided L3-4 lumbar epidural steroid injection Diagnostic right-sided transforaminal L3-4 epidural steroid injection Diagnostic right-sided trochanteric bursa injection Diagnostic right-sided sacroiliac joint block   Palliative PRN treatment(s):   Palliativebilateral lumbar facet block     Imaging Orders     DG C-Arm 1-60 Min-No Report  Procedure Orders     LUMBAR FACET(MEDIAL BRANCH NERVE BLOCK) MBNB  Medications ordered for procedure: Meds ordered  this encounter  Medications  . lidocaine (XYLOCAINE) 2 % (with pres) injection 400 mg  . ropivacaine (PF) 2 mg/mL (0.2%) (NAROPIN) injection 18 mL  . triamcinolone acetonide (KENALOG-40) injection 80 mg   Medications administered: We administered lidocaine, ropivacaine (PF) 2 mg/mL (0.2%), and triamcinolone acetonide.  See the medical record for exact dosing, route, and time of administration.  Disposition: Discharge home  Discharge Date & Time: 05/05/2018; 0932 hrs.   Physician-requested Follow-up: Return for post-procedure eval (2 wks), w/ Dionisio David, NP.  Future Appointments  Date Time Provider [place]  05/19/2018 10:45 AM Vevelyn Francois, NP [facility] None  06/16/2018  9:00 AM Olin Hauser, DO [facility] None  09/15/2018  8:30 AM [facility] None  09/15/2018  9:45 AM Dew, Erskine Squibb, MD [facility] None  11/04/2018 11:00 AM Vevelyn Francois, NP [facility] None   Primary Care Physician: Olin Hauser, DO Location: [facility] Note by: Gaspar Cola, MD Date: 05/05/2018; Time: 11:28 AM  Disclaimer:  Medicine is not an exact science. The only guarantee in medicine is that nothing is guaranteed. It is important to note that the decision to proceed with this intervention was based on the information collected from the patient. The Data and conclusions were drawn from the patient's questionnaire, the interview, and the physical examination. Because the information was provided in large part by the patient, it cannot be guaranteed that it has not been purposely or unconsciously manipulated. Every effort has been made to obtain as much relevant data as possible for this evaluation. It is important to note that the conclusions that lead to this procedure  are derived in large part from the available data. Always take into account that the treatment will also be dependent on availability of resources and existing treatment guidelines, considered by other Pain Management Practitioners as being common knowledge and practice, at the time of the intervention. For Medico-Legal purposes, it is also important to point out that variation in procedural techniques and pharmacological choices are the acceptable norm. The indications, contraindications, technique, and results of the above procedure should only be interpreted and judged by a Board-Certified Interventional Pain Specialist with extensive familiarity and expertise in the same exact procedure and technique.

## 2018-05-06 ENCOUNTER — Telehealth: Payer: Self-pay | Admitting: *Deleted

## 2018-05-06 NOTE — Telephone Encounter (Signed)
Spoke with daughter Arbie Cookey, she will call Ms. Ratterree and call us back if any problems.

## 2018-05-13 DIAGNOSIS — E871 Hypo-osmolality and hyponatremia: Secondary | ICD-10-CM | POA: Diagnosis not present

## 2018-05-13 DIAGNOSIS — N390 Urinary tract infection, site not specified: Secondary | ICD-10-CM | POA: Diagnosis not present

## 2018-05-13 DIAGNOSIS — I10 Essential (primary) hypertension: Secondary | ICD-10-CM | POA: Diagnosis not present

## 2018-05-19 ENCOUNTER — Ambulatory Visit: Payer: BLUE CROSS/BLUE SHIELD | Admitting: Nurse Practitioner

## 2018-05-27 ENCOUNTER — Ambulatory Visit: Payer: Medicare Other | Attending: Nurse Practitioner | Admitting: Nurse Practitioner

## 2018-05-27 ENCOUNTER — Encounter: Payer: Self-pay | Admitting: Nurse Practitioner

## 2018-05-27 VITALS — BP 159/86 | HR 57 | Temp 98.3°F | Resp 16 | Ht 66.0 in | Wt 128.0 lb

## 2018-05-27 DIAGNOSIS — K219 Gastro-esophageal reflux disease without esophagitis: Secondary | ICD-10-CM | POA: Insufficient documentation

## 2018-05-27 DIAGNOSIS — M48061 Spinal stenosis, lumbar region without neurogenic claudication: Secondary | ICD-10-CM | POA: Insufficient documentation

## 2018-05-27 DIAGNOSIS — N183 Chronic kidney disease, stage 3 (moderate): Secondary | ICD-10-CM | POA: Insufficient documentation

## 2018-05-27 DIAGNOSIS — Z760 Encounter for issue of repeat prescription: Secondary | ICD-10-CM | POA: Diagnosis not present

## 2018-05-27 DIAGNOSIS — Z79891 Long term (current) use of opiate analgesic: Secondary | ICD-10-CM | POA: Diagnosis not present

## 2018-05-27 DIAGNOSIS — G894 Chronic pain syndrome: Secondary | ICD-10-CM | POA: Insufficient documentation

## 2018-05-27 DIAGNOSIS — M47817 Spondylosis without myelopathy or radiculopathy, lumbosacral region: Secondary | ICD-10-CM | POA: Diagnosis not present

## 2018-05-27 DIAGNOSIS — M4317 Spondylolisthesis, lumbosacral region: Secondary | ICD-10-CM | POA: Insufficient documentation

## 2018-05-27 DIAGNOSIS — M79606 Pain in leg, unspecified: Secondary | ICD-10-CM | POA: Insufficient documentation

## 2018-05-27 DIAGNOSIS — E039 Hypothyroidism, unspecified: Secondary | ICD-10-CM | POA: Insufficient documentation

## 2018-05-27 DIAGNOSIS — M7918 Myalgia, other site: Secondary | ICD-10-CM | POA: Diagnosis not present

## 2018-05-27 DIAGNOSIS — M7061 Trochanteric bursitis, right hip: Secondary | ICD-10-CM | POA: Insufficient documentation

## 2018-05-27 DIAGNOSIS — R51 Headache: Secondary | ICD-10-CM | POA: Insufficient documentation

## 2018-05-27 DIAGNOSIS — M545 Low back pain: Secondary | ICD-10-CM | POA: Diagnosis not present

## 2018-05-27 DIAGNOSIS — M533 Sacrococcygeal disorders, not elsewhere classified: Secondary | ICD-10-CM | POA: Diagnosis not present

## 2018-05-27 DIAGNOSIS — L989 Disorder of the skin and subcutaneous tissue, unspecified: Secondary | ICD-10-CM | POA: Insufficient documentation

## 2018-05-27 DIAGNOSIS — F419 Anxiety disorder, unspecified: Secondary | ICD-10-CM | POA: Insufficient documentation

## 2018-05-27 DIAGNOSIS — M25551 Pain in right hip: Secondary | ICD-10-CM | POA: Insufficient documentation

## 2018-05-27 DIAGNOSIS — M1611 Unilateral primary osteoarthritis, right hip: Secondary | ICD-10-CM | POA: Insufficient documentation

## 2018-05-27 DIAGNOSIS — M47816 Spondylosis without myelopathy or radiculopathy, lumbar region: Secondary | ICD-10-CM | POA: Diagnosis not present

## 2018-05-27 DIAGNOSIS — E785 Hyperlipidemia, unspecified: Secondary | ICD-10-CM | POA: Insufficient documentation

## 2018-05-27 DIAGNOSIS — R7309 Other abnormal glucose: Secondary | ICD-10-CM | POA: Insufficient documentation

## 2018-05-27 DIAGNOSIS — I129 Hypertensive chronic kidney disease with stage 1 through stage 4 chronic kidney disease, or unspecified chronic kidney disease: Secondary | ICD-10-CM | POA: Diagnosis not present

## 2018-05-27 DIAGNOSIS — K449 Diaphragmatic hernia without obstruction or gangrene: Secondary | ICD-10-CM | POA: Insufficient documentation

## 2018-05-27 DIAGNOSIS — Z79899 Other long term (current) drug therapy: Secondary | ICD-10-CM | POA: Insufficient documentation

## 2018-05-27 DIAGNOSIS — Z5181 Encounter for therapeutic drug level monitoring: Secondary | ICD-10-CM | POA: Diagnosis not present

## 2018-05-27 DIAGNOSIS — M8580 Other specified disorders of bone density and structure, unspecified site: Secondary | ICD-10-CM | POA: Insufficient documentation

## 2018-05-27 NOTE — Progress Notes (Signed)
Patient's Name: Brittney Simpson  MRN: [id number]  Referring Provider: Nobie Putnam *  DOB: September 26, 1927  PCP: Olin Hauser, DO  DOS: 05/27/2018  Note by: Vevelyn Francois NP  Service setting: Ambulatory outpatient  Specialty: Interventional Pain Management  Location: [facility] (AMB) Pain Management Facility    Patient type: Established    Primary Reason(s) for Visit: Encounter for prescription drug management & post-procedure evaluation of chronic illness with mild to moderate exacerbation(Level of risk: moderate) CC: Back Pain (lumbar right ) and Headache  HPI  Brittney Simpson is a 84 y.o. year old, female patient, who comes today for a post-procedure evaluation and medication management. She has Long term current use of opiate analgesic; Long term prescription opiate use; Opiate use (15 MME/Day); Encounter for therapeutic drug level monitoring; Lumbar facet syndrome (Bilateral) (R>L); Chronic low back pain (Bilateral) (R>L); Spondylolisthesis of lumbosacral region (L2-3 and L5-S1); Lumbar spinal stenosis (9 mm at L3-4); Trochanteric bursitis (Right); Chronic hip pain (Right); Osteoarthritis of hip (Right); Chronic sacroiliac joint pain (Right); Chronic lumbar radicular pain (Right); Chronic lower extremity pain (Right); Myofascial pain; Encounter for chronic pain management; Essential hypertension; CKD (chronic kidney disease) stage 3, GFR 30-59 ml/min ([place]); Skin lesion of scalp; Chronic anxiety; Insomnia; Hypothyroidism; Spondylosis without myelopathy or radiculopathy, lumbosacral region; Recurrent UTI; GERD (gastroesophageal reflux disease); Presbycusis of both ears; Hyperlipidemia; Abnormal glucose; Bilateral hearing loss; Osteoporosis; AAA (abdominal aortic aneurysm) without rupture ([place]); Ankle edema, bilateral; and Abnormal MRI, lumbar spine (01/10/2017) on their problem list. Her primarily concern today is the Back Pain (lumbar right ) and Headache  Pain Assessment: Location:  Lower, Right, Left Back Radiating: into the right hip Onset: More than a month ago Duration: Chronic pain Quality: Discomfort, Nagging Severity: 3 /10 (subjective, self-reported pain score)  Note: Reported level is compatible with observation.                          Effect on ADL: can do house hold chores in short duration  Timing: Intermittent Modifying factors: medications and procedures  BP: (!) 159/86  HR: (!) 57  Brittney Simpson was last seen on 05/06/2018 for a procedure. During today's appointment we reviewed Ms. Robar's post-procedure results, as well as her outpatient medication regimen.  Further details on both, my assessment(s), as well as the proposed treatment plan, please see below.  Post-Procedure Assessment  04/29/2018 Procedure: Bilateral Lumbar facet Nerve block Pre-procedure pain score:  8/10 Post-procedure pain score: 0/10         Influential Factors: BMI: 20.66 kg/m Intra-procedural challenges: None observed.         Assessment challenges: None detected.              Reported side-effects: None.        Post-procedural adverse reactions or complications: None reported         Sedation: Please see nurses note. When no sedatives are used, the analgesic levels obtained are directly associated to the effectiveness of the local anesthetics. However, when sedation is provided, the level of analgesia obtained during the initial 1 hour following the intervention, is believed to be the result of a combination of factors. These factors may include, but are not limited to: 1. The effectiveness of the local anesthetics used. 2. The effects of the analgesic(s) and/or anxiolytic(s) used. 3. The degree of discomfort experienced by the patient at the time of the procedure. 4. The patients ability and reliability in recalling and recording  the events. 5. The presence and influence of possible secondary gains and/or psychosocial factors. Reported result: Relief experienced  during the 1st hour after the procedure: 100 % (Ultra-Short Term Relief)            Interpretative annotation: Clinically appropriate result. Analgesia during this period is likely to be Local Anesthetic and/or IV Sedative (Analgesic/Anxiolytic) related.          Effects of local anesthetic: The analgesic effects attained during this period are directly associated to the localized infiltration of local anesthetics and therefore cary significant diagnostic value as to the etiological location, or anatomical origin, of the pain. Expected duration of relief is directly dependent on the pharmacodynamics of the local anesthetic used. Long-acting (4-6 hours) anesthetics used.  Reported result: Relief during the next 4 to 6 hour after the procedure: 100 % (Short-Term Relief)            Interpretative annotation: Clinically appropriate result. Analgesia during this period is likely to be Local Anesthetic-related.          Long-term benefit: Defined as the period of time past the expected duration of local anesthetics (1 hour for short-acting and 4-6 hours for long-acting). With the possible exception of prolonged sympathetic blockade from the local anesthetics, benefits during this period are typically attributed to, or associated with, other factors such as analgesic sensory neuropraxia, antiinflammatory effects, or beneficial biochemical changes provided by agents other than the local anesthetics.  Reported result: Extended relief following procedure: 100 %(pain relief from procedure is very good until she gets up to something like sweep or vaccuum. ) (Long-Term Relief)            Interpretative annotation: Clinically possible results. Good relief. No permanent benefit expected. Inflammation plays a part in the etiology to the pain.          Current benefits: Defined as reported results that persistent at this point in time.   Analgesia: >50 %            Function: Somewhat improved ROM: Somewhat  improved Interpretative annotation: Good relief.    Effective therapeutic approach.          Interpretation: Results would suggest a successful therapeutic intervention.                  Plan:  Please see "Plan of Care" for details.                Laboratory Chemistry  Inflammation Markers (CRP: Acute Phase) (ESR: Chronic Phase) No results found for: CRP, ESRSEDRATE, LATICACIDVEN                       Rheumatology Markers No results found for: RF, ANA, LABURIC, URICUR, LYMEIGGIGMAB, LYMEABIGMQN, HLAB27                      Renal Function Markers Lab Results  Component Value Date   BUN 8 12/23/2017   CREATININE 0.81 92/95/7473   BCR NOT APPLICABLE 40/37/0964   GFRAA 76 12/23/2017   GFRNONAA 65 12/23/2017                             Hepatic Function Markers Lab Results  Component Value Date   AST 11 11/22/2016   ALT 9 11/22/2016   ALBUMIN 3.6 11/22/2016   ALKPHOS 59 11/22/2016   LIPASE 21 06/14/2016  Electrolytes Lab Results  Component Value Date   NA 138 12/23/2017   K 4.0 12/23/2017   CL 99 12/23/2017   CALCIUM 9.8 12/23/2017   MG 2.1 01/14/2014                        Neuropathy Markers Lab Results  Component Value Date   HGBA1C 5.6 12/23/2017                        CNS Tests No results found for: COLORCSF, APPEARCSF, RBCCOUNTCSF, WBCCSF, POLYSCSF, LYMPHSCSF, EOSCSF, PROTEINCSF, GLUCCSF, JCVIRUS, CSFOLI, IGGCSF                      Bone Pathology Markers No results found for: VD25OH, [id number], [id number], [id number], 25OHVITD1, 25OHVITD2, 25OHVITD3, TESTOFREE, TESTOSTERONE                       Coagulation Parameters Lab Results  Component Value Date   PLT 171 11/22/2016                        Cardiovascular Markers Lab Results  Component Value Date   CKTOTAL 65 04/23/2012   CKMB 1.4 04/23/2012   TROPONINI <0.03 11/24/2014   HGB 11.9 11/22/2016   HCT 38.8 11/22/2016                         CA Markers No results found  for: CEA, CA125, LABCA2                      Note: Lab results reviewed.  Recent Diagnostic Imaging Results  DG C-Arm 1-60 Min-No Report Fluoroscopy was utilized by the requesting physician.  No radiographic  interpretation.   Complexity Note: Imaging results reviewed. Results shared with Ms. Dorrance, using Layman's terms.                         Meds   Current Outpatient Medications:  .  acetaminophen (TYLENOL) 500 MG tablet, Take 1,000 mg by mouth daily as needed for moderate pain or headache., Disp: , Rfl:  .  amLODipine (NORVASC) 5 MG tablet, TAKE 1 TABLET BY MOUTH ONCE DAILY, Disp: 90 tablet, Rfl: 3 .  ASPERCREME LIDOCAINE EX, Apply 1 application topically., Disp: , Rfl:  .  brimonidine (ALPHAGAN) 0.2 % ophthalmic solution, Place 1 drop into the left eye 2 (two) times daily. , Disp: , Rfl:  .  clonazePAM (KLONOPIN) 0.5 MG tablet, TAKE 1/2 TABLET BY MOUTH TWICE DAILY FORANXIETY, Disp: 30 tablet, Rfl: 5 .  Cranberry 250 MG CAPS, Take 250 mg by mouth daily., Disp: , Rfl:  .  Ergocalciferol (VITAMIN D2) 2000 UNITS TABS, Take 2,000 Units by mouth daily with lunch. , Disp: , Rfl:  .  furosemide (LASIX) 20 MG tablet, Take 1 tablet (20 mg total) by mouth daily as needed., Disp: 30 tablet, Rfl: 5 .  latanoprost (XALATAN) 0.005 % ophthalmic solution, Place 1 drop into the left eye at bedtime. , Disp: , Rfl:  .  levothyroxine (SYNTHROID, LEVOTHROID) 112 MCG tablet, TAKE 1 TABLET BY MOUTH ONCE DAILY ON AN EMPTY STOMACH. WAIT 30 MINUTES BEFORE TAKING OTHER MEDS., Disp: 90 tablet, Rfl: 3 .  lisinopril (PRINIVIL,ZESTRIL) 40 MG tablet, Take 1 tablet (40 mg total) by mouth daily., Disp: 90 tablet, Rfl: 3 .  metoprolol succinate (TOPROL-XL) 100 MG 24 hr tablet, TAKE 1 TABLET BY MOUTH ONCE DAILY WITH OR IMMEDIATELY AFTER A MEAL., Disp: 30 tablet, Rfl: 5 .  omeprazole (PRILOSEC) 20 MG capsule, TAKE 1 CAPSULE BY MOUTH ONCE DAILY, Disp: 30 capsule, Rfl: 11 .  timolol (TIMOPTIC) 0.5 % ophthalmic solution,  Place 1 drop into the left eye 2 (two) times daily. , Disp: , Rfl:  .  traMADol (ULTRAM) 50 MG tablet, Take 1 tablet (50 mg total) by mouth 3 (three) times daily as needed for severe pain., Disp: 90 tablet, Rfl: 5 .  traZODone (DESYREL) 50 MG tablet, TAKE 1 TABLET BY MOUTH AT BEDTIME, Disp: 30 tablet, Rfl: 11 .  ciprofloxacin (CIPRO) 500 MG tablet, Take 500 mg by mouth 2 (two) times daily., Disp: , Rfl:   ROS  Constitutional: Denies any fever or chills Gastrointestinal: No reported hemesis, hematochezia, vomiting, or acute GI distress Musculoskeletal: Denies any acute onset joint swelling, redness, loss of ROM, or weakness Neurological: No reported episodes of acute onset apraxia, aphasia, dysarthria, agnosia, amnesia, paralysis, loss of coordination, or loss of consciousness  Allergies  Ms. Turan has No Known Allergies.  [place]  Drug: Ms. Bigos  reports that she does not use drugs. Alcohol:  reports that she does not drink alcohol. Tobacco:  reports that she has never smoked. She has never used smokeless tobacco. Medical:  has a past medical history of Anxiety, Back ache, Hiatal hernia, Hyperlipidemia, Hypertension, Hypothyroid, Osteopenia, Thyroid disease, and UTI (urinary tract infection). Surgical: Ms. Loyer  has a past surgical history that includes Hernia repair; Hernia repair; Hemorrhoid surgery; and Kyphoplasty (N/A, 01/23/2017). Family: family history includes Cancer in her father; Stroke in her mother.  Constitutional Exam  General appearance: Well nourished, well developed, and well hydrated. In no apparent acute distress Vitals:   05/27/18 1436  BP: (!) 159/86  Pulse: (!) 57  Resp: 16  Temp: 98.3 F (36.8 C)  TempSrc: Oral  SpO2: 97%  Weight: 128 lb (58.1 kg)  Height: '5\' 6"'  (1.676 m)  Psych/Mental status: Alert, oriented x 3 (person, place, & time)       Eyes: PERLA Respiratory: No evidence of acute respiratory distress  Lumbar Spine Area Exam  Skin & Axial  Inspection: No masses, redness, or swelling Alignment: Symmetrical Functional ROM: Unrestricted ROM       Stability: No instability detected Muscle Tone/Strength: Functionally intact. No obvious neuro-muscular anomalies detected. Sensory (Neurological): Unimpaired Palpation: No palpable anomalies         Gait & Posture Assessment  Ambulation: Unassisted Gait: Relatively normal for age and body habitus Posture: WNL   Lower Extremity Exam    Side: Right lower extremity  Side: Left lower extremity  Stability: No instability observed          Stability: No instability observed          Skin & Extremity Inspection: Skin color, temperature, and hair growth are WNL. No peripheral edema or cyanosis. No masses, redness, swelling, asymmetry, or associated skin lesions. No contractures.  Skin & Extremity Inspection: Skin color, temperature, and hair growth are WNL. No peripheral edema or cyanosis. No masses, redness, swelling, asymmetry, or associated skin lesions. No contractures.  Functional ROM: Unrestricted ROM                  Functional ROM: Unrestricted ROM                  Muscle Tone/Strength: Functionally intact. No obvious  neuro-muscular anomalies detected.  Muscle Tone/Strength: Functionally intact. No obvious neuro-muscular anomalies detected.  Sensory (Neurological): Unimpaired  Sensory (Neurological): Unimpaired  Palpation: No palpable anomalies  Palpation: No palpable anomalies   Assessment  Primary Diagnosis & Pertinent Problem List: The primary encounter diagnosis was Spondylosis without myelopathy or radiculopathy, lumbosacral region. Diagnoses of Lumbar facet syndrome (Bilateral) (R>L) and Chronic pain syndrome were also pertinent to this visit.  Status Diagnosis  Controlled Controlled Controlled 1. Spondylosis without myelopathy or radiculopathy, lumbosacral region   2. Lumbar facet syndrome (Bilateral) (R>L)   3. Chronic pain syndrome     Problems updated and reviewed  during this visit: No problems updated. Plan of Care  Pharmacotherapy (Medications Ordered): No orders of the defined types were placed in this encounter.  New Prescriptions   No medications on file   Medications administered today: [name] had no medications administered during this visit. Lab-work, procedure(s), and/or referral(s): No orders of the defined types were placed in this encounter.  Imaging and/or referral(s): None  Interventional therapies: Planned, scheduled, and/or pending: Palliativebilateral lumbar facet block    Considering:  Palliativebilateral lumbar facet block  Possible bilateral lumbar facet radiofrequency ablation.  Diagnostic right-sided intra-articular hip joint injection Palliative right-sided L3-4 lumbar epidural steroid injection Diagnostic right-sided transforaminal L3-4 epidural steroid injection Diagnostic right-sided trochanteric bursa injection Diagnostic right-sided sacroiliac joint block   Palliative PRN treatment(s):  Palliativebilateral lumbar facet block  Diagnostic right-sided intra-articular hip joint injection Palliative right-sided L3-4 lumbar epidural steroid injection Diagnostic right-sided transforaminal L3-4 epidural steroid injection Diagnostic right-sided trochanteric bursa injection Diagnostic right-sided sacroiliac joint block    Provider-requested follow-up: Return for Appointment As Scheduled.  Future Appointments  Date Time Provider [place]  06/16/2018  9:00 AM Olin Hauser, DO [place] None  09/15/2018  8:30 AM [facility] 2 [facility] None  09/15/2018  9:45 AM Dew, Erskine Squibb, MD [facility] None  11/04/2018 11:00 AM Vevelyn Francois, NP [facility] None   Primary Care Physician: Olin Hauser, DO Location: [facility] Outpatient Pain Management Facility Note by: Vevelyn Francois NP Date: 05/27/2018; Time: 4:41 PM  Pain Score Disclaimer: We use the NRS-11 scale.  This is a self-reported, subjective measurement of pain severity with only modest accuracy. It is used primarily to identify changes within a particular patient. It must be understood that outpatient pain scales are significantly less accurate that those used for research, where they can be applied under ideal controlled circumstances with minimal exposure to variables. In reality, the score is likely to be a combination of pain intensity and pain affect, where pain affect describes the degree of emotional arousal or changes in action readiness caused by the sensory experience of pain. Factors such as social and work situation, setting, emotional state, anxiety levels, expectation, and prior pain experience may influence pain perception and show large inter-individual differences that may also be affected by time variables.  Patient instructions provided during this appointment: Patient Instructions  ____________________________________________________________________________________________  Medication Rules  Applies to: All patients receiving prescriptions (written or electronic).  Pharmacy of record: Pharmacy where electronic prescriptions will be sent. If written prescriptions are taken to a different pharmacy, please inform the nursing staff. The pharmacy listed in the electronic medical record should be the one where you would like electronic prescriptions to be sent.  Prescription refills: Only during scheduled appointments. Applies to both, written and electronic prescriptions.  NOTE: The following applies primarily to controlled substances (Opioid* Pain Medications).   Patient's responsibilities: 1. Pain  Pills: Bring all pain pills to every appointment (except for procedure appointments). 2. Pill Bottles: Bring pills in original pharmacy bottle. Always bring newest bottle. Bring bottle, even if empty. 3. Medication refills: You are responsible for knowing and keeping track of what  medications you need refilled. The day before your appointment, write a list of all prescriptions that need to be refilled. Bring that list to your appointment and give it to the admitting nurse. Prescriptions will be written only during appointments. If you forget a medication, it will not be "Called in", "Faxed", or "electronically sent". You will need to get another appointment to get these prescribed. 4. Prescription Accuracy: You are responsible for carefully inspecting your prescriptions before leaving our office. Have the discharge nurse carefully go over each prescription with you, before taking them home. Make sure that your name is accurately spelled, that your address is correct. Check the name and dose of your medication to make sure it is accurate. Check the number of pills, and the written instructions to make sure they are clear and accurate. Make sure that you are given enough medication to last until your next medication refill appointment. 5. Taking Medication: Take medication as prescribed. Never take more pills than instructed. Never take medication more frequently than prescribed. Taking less pills or less frequently is permitted and encouraged, when it comes to controlled substances (written prescriptions).  6. Inform other Doctors: Always inform, all of your healthcare providers, of all the medications you take. 7. Pain Medication from other Providers: You are not allowed to accept any additional pain medication from any other Doctor or Healthcare provider. There are two exceptions to this rule. (see below) In the event that you require additional pain medication, you are responsible for notifying us, as stated below. 8. Medication Agreement: You are responsible for carefully reading and following our Medication Agreement. This must be signed before receiving any prescriptions from our practice. Safely store a copy of your signed Agreement. Violations to the Agreement will result in no  further prescriptions. (Additional copies of our Medication Agreement are available upon request.) 9. Laws, Rules, & Regulations: All patients are expected to follow all Federal and [employer], Rules, [employer]. Ignorance of the Laws does not constitute a valid excuse. The use of any illegal substances is prohibited. 10. Adopted CDC guidelines & recommendations: Target dosing levels will be at or below 60 MME/day. Use of benzodiazepines** is not recommended.  Exceptions: There are only two exceptions to the rule of not receiving pain medications from other Healthcare Providers. 1. Exception #1 (Emergencies): In the event of an emergency (i.e.: accident requiring emergency care), you are allowed to receive additional pain medication. However, you are responsible for: As soon as you are able, call our office [phone], at any time of the day or night, and leave a message stating your name, the date and nature of the emergency, and the name and dose of the medication prescribed. In the event that your call is answered by a member of our staff, make sure to document and save the date, time, and the name of the person that took your information.  2. Exception #2 (Planned Surgery): In the event that you are scheduled by another doctor or dentist to have any type of surgery or procedure, you are allowed (for a period no longer than 30 days), to receive additional pain medication, for the acute post-op pain. However, in this case, you are responsible for picking up a  copy of our "Post-op Pain Management for Surgeons" handout, and giving it to your surgeon or dentist. This document is available at our office, and does not require an appointment to obtain it. Simply go to our office during business hours (Monday-Thursday from 8:00 AM to 4:00 PM) (Friday 8:00 AM to 12:00 Noon) or if you have a scheduled appointment with [place], prior to your surgery, and ask for it by name. In addition, you will need to  provide [place] with your name, name of your surgeon, type of surgery, and date of procedure or surgery.  *Opioid medications include: morphine, codeine, oxycodone, oxymorphone, hydrocodone, hydromorphone, meperidine, tramadol, tapentadol, buprenorphine, fentanyl, methadone. **Benzodiazepine medications include: diazepam (Valium), alprazolam (Xanax), clonazepam (Klonopine), lorazepam (Ativan), clorazepate (Tranxene), chlordiazepoxide (Librium), estazolam (Prosom), oxazepam (Serax), temazepam (Restoril), triazolam (Halcion) (Last updated: 09/25/2017) ____________________________________________________________________________________________

## 2018-06-08 ENCOUNTER — Ambulatory Visit: Payer: Medicare Other | Admitting: Family Medicine

## 2018-06-12 ENCOUNTER — Other Ambulatory Visit: Payer: Self-pay | Admitting: Family Medicine

## 2018-06-12 DIAGNOSIS — I10 Essential (primary) hypertension: Secondary | ICD-10-CM

## 2018-06-16 ENCOUNTER — Encounter: Payer: Self-pay | Admitting: Family Medicine

## 2018-06-16 ENCOUNTER — Ambulatory Visit (INDEPENDENT_AMBULATORY_CARE_PROVIDER_SITE_OTHER): Payer: Medicare Other | Admitting: Family Medicine

## 2018-06-16 VITALS — BP 167/64 | HR 53 | Temp 98.4°F | Resp 16 | Ht 66.0 in | Wt 127.0 lb

## 2018-06-16 DIAGNOSIS — F5101 Primary insomnia: Secondary | ICD-10-CM

## 2018-06-16 DIAGNOSIS — I10 Essential (primary) hypertension: Secondary | ICD-10-CM

## 2018-06-16 DIAGNOSIS — N183 Chronic kidney disease, stage 3 unspecified: Secondary | ICD-10-CM

## 2018-06-16 DIAGNOSIS — E034 Atrophy of thyroid (acquired): Secondary | ICD-10-CM | POA: Diagnosis not present

## 2018-06-16 DIAGNOSIS — Z23 Encounter for immunization: Secondary | ICD-10-CM

## 2018-06-16 DIAGNOSIS — R7309 Other abnormal glucose: Secondary | ICD-10-CM | POA: Diagnosis not present

## 2018-06-16 DIAGNOSIS — E782 Mixed hyperlipidemia: Secondary | ICD-10-CM | POA: Diagnosis not present

## 2018-06-16 NOTE — Progress Notes (Signed)
Subjective:    Patient ID: Brittney Simpson, female    DOB: 1928-02-01, 84 y.o.   MRN: [id number]  Brittney Simpson is a 84 y.o. female presenting on 06/16/2018 for Hypertension  Patient accompanied by daughter (and primary caregiver) [name], who provides majority of history and able to help facilitate history from her mother, limited by hearing.  HPI   Specialist: Nephrology - Dr Lavonia Dana [facility]) Pain Management - Dr Milinda Pointer [facility]) Vascular - Dr Leotis Pain [facility])  CHRONIC HTN: Recent outside BP readings 150 to 170 on average, including per Nephrology Home readings recently fairly improved but has some chronic hard to control HTN Current Meds - Amlodipine 5mg  daily, Lisinopril 40mg  daily, Metoprolol XL 100mg  daily, Lasix 20mg  daily PRN Reports good compliance, took meds today. Tolerating well, w/o complaints. Denies CP, dyspnea, HA, edema, dizziness / lightheadedness  Hypothyroidism Chronic problem. Taking Levothyroxine 171mcg daily currently. Last TSH 11/2017 was 0.23 mild low, due for repeat lab  Elevated A1c A1c up to 5.6, prior 5.5. No change in lifestyle or diet.  Chronic Low Back Pain, Lumbar DJD Spinal Stenosis / Facet DJD Followed by [facility] See prior background information - No complaint of pain today  CKD-III, secondary to HTN Chronic problem without any recent concerns, continues to be followed by Nephrology (Dr Phill Myron 6 months, last labs done 04/2018 - request record - Currently doing well, no new concerns today  ANXIETY / INSOMNIA - Last visit with mefor this problem 04/2017, treated withcontinued Trazodone 50mg  nightly for sleep and refilled chronic Clonazepam, see prior notes for background information. - Today patient reportsno new concerns. She is doing well. Anxiety is controlled. Sleeping well on medicine. -Denies any problems with  withdrawal from BDZ, aware of risks of sedation and potential fall risk at age  Additional complaints - R Flank Pain Rib Pain - recent problem past few days, no acute injury, under bra, worse if sweeping and vacuuming or twisting. Not tried topical therapy yet or medicine. Taking tramadol already limited relief. IN past took flexeril 10mg  but did not tolerate  Health Maintenance: Due for Flu Shot, will receive today    Depression screen [facility] 2/9 06/16/2018 05/05/2018 04/29/2018  Decreased Interest 0 0 0  Down, Depressed, Hopeless 0 0 0  PHQ - 2 Score 0 0 0    Past Medical History:  Diagnosis Date  . Anxiety   . Back ache   . Hiatal hernia   . Hyperlipidemia   . Hypertension   . Hypothyroid   . Osteopenia   . Thyroid disease   . UTI (urinary tract infection)    Past Surgical History:  Procedure Laterality Date  . HEMORRHOID SURGERY    . HERNIA REPAIR    . HERNIA REPAIR    . KYPHOPLASTY N/A 01/23/2017   Procedure: [id number];  Surgeon: Hessie Knows, MD;  Location: [facility] ORS;  Service: Orthopedics;  Laterality: N/A;   Social History   Socioeconomic History  . Marital status: Widowed    Spouse name: Not on file  . Number of children: Not on file  . Years of education: Not on file  . Highest education level: Not on file  Occupational History  . Not on file  Social Needs  . Financial resource strain: Not on file  . Food insecurity:    Worry: Not on file    Inability: Not on file  . Transportation needs:  Medical: Not on file    Non-medical: Not on file  Tobacco Use  . Smoking status: Never Smoker  . Smokeless tobacco: Never Used  Substance and Sexual Activity  . Alcohol use: No    Alcohol/week: 0.0 standard drinks  . Drug use: No  . Sexual activity: Yes    Birth control/protection: Injection  Lifestyle  . Physical activity:    Days per week: Not on file    Minutes per session: Not on file  . Stress: Not on file  Relationships  . Social connections:     Talks on phone: Not on file    Gets together: Not on file    Attends religious service: Not on file    Active member of club or organization: Not on file    Attends meetings of clubs or organizations: Not on file    Relationship status: Not on file  . Intimate partner violence:    Fear of current or ex partner: Not on file    Emotionally abused: Not on file    Physically abused: Not on file    Forced sexual activity: Not on file  Other Topics Concern  . Not on file  Social History Narrative  . Not on file   Family History  Problem Relation Age of Onset  . Stroke Mother   . Cancer Father    Current Outpatient Medications on File Prior to Visit  Medication Sig  . acetaminophen (TYLENOL) 500 MG tablet Take 1,000 mg by mouth daily as needed for moderate pain or headache.  Marland Kitchen amLODipine (NORVASC) 5 MG tablet TAKE 1 TABLET BY MOUTH ONCE DAILY  . ASPERCREME LIDOCAINE EX Apply 1 application topically.  . brimonidine (ALPHAGAN) 0.2 % ophthalmic solution Place 1 drop into the left eye 2 (two) times daily.   . ciprofloxacin (CIPRO) 500 MG tablet Take 500 mg by mouth 2 (two) times daily.  . clonazePAM (KLONOPIN) 0.5 MG tablet TAKE 1/2 TABLET BY MOUTH TWICE DAILY FORANXIETY  . Cranberry 250 MG CAPS Take 250 mg by mouth daily.  . Ergocalciferol (VITAMIN D2) 2000 UNITS TABS Take 2,000 Units by mouth daily with lunch.   . furosemide (LASIX) 20 MG tablet Take 1 tablet (20 mg total) by mouth daily as needed.  . latanoprost (XALATAN) 0.005 % ophthalmic solution Place 1 drop into the left eye at bedtime.   Marland Kitchen levothyroxine (SYNTHROID, LEVOTHROID) 112 MCG tablet TAKE 1 TABLET BY MOUTH ONCE DAILY ON AN EMPTY STOMACH. WAIT 30 MINUTES BEFORE TAKING OTHER MEDS.  Marland Kitchen lisinopril (PRINIVIL,ZESTRIL) 40 MG tablet TAKE 1 TABLET BY MOUTH ONCE DAILY  . metoprolol succinate (TOPROL-XL) 100 MG 24 hr tablet TAKE 1 TABLET BY MOUTH ONCE DAILY WITH OR IMMEDIATELY AFTER A MEAL.  Marland Kitchen omeprazole (PRILOSEC) 20 MG capsule TAKE 1  CAPSULE BY MOUTH ONCE DAILY  . timolol (TIMOPTIC) 0.5 % ophthalmic solution Place 1 drop into the left eye 2 (two) times daily.   . traMADol (ULTRAM) 50 MG tablet Take 1 tablet (50 mg total) by mouth 3 (three) times daily as needed for severe pain.  . traZODone (DESYREL) 50 MG tablet TAKE 1 TABLET BY MOUTH AT BEDTIME   No current facility-administered medications on file prior to visit.     Review of Systems Per HPI unless specifically indicated above     Objective:    BP (!) 167/64   Pulse (!) 53   Temp 98.4 F (36.9 C) (Oral)   Resp 16   Ht 5\' 6"  (1.676  m)   Wt 127 lb (57.6 kg)   BMI 20.50 kg/m   Wt Readings from Last 3 Encounters:  06/16/18 127 lb (57.6 kg)  05/27/18 128 lb (58.1 kg)  05/05/18 125 lb (56.7 kg)    Physical Exam Results for orders placed or performed in visit on 78/29/56  BASIC METABOLIC PANEL WITH GFR  Result Value Ref Range   Glucose, Bld 93 65 - 99 mg/dL   BUN 8 7 - 25 mg/dL   Creat 0.81 0.60 - 0.88 mg/dL   GFR, Est Non African American 65 > OR = 60 mL/min/1.45m2   GFR, Est African American 76 > OR = 60 mL/min/1.59m2   BUN/Creatinine Ratio NOT APPLICABLE 6 - 22 (calc)   Sodium 138 135 - 146 mmol/L   Potassium 4.0 3.5 - 5.3 mmol/L   Chloride 99 98 - 110 mmol/L   CO2 31 20 - 32 mmol/L   Calcium 9.8 8.6 - 10.4 mg/dL  Hemoglobin A1c  Result Value Ref Range   Hgb A1c MFr Bld 5.6 <5.7 % of total Hgb   Mean Plasma Glucose 114 (calc)   eAG (mmol/L) 6.3 (calc)  T4, free  Result Value Ref Range   Free T4 1.9 (H) 0.8 - 1.8 ng/dL  TSH  Result Value Ref Range   TSH 0.23 (L) 0.40 - 4.50 mIU/L      Assessment & Plan:   Problem List Items Addressed This Visit    Abnormal glucose Prior mild elevated near PReDM range Will check A1c soon with next panel    CKD (chronic kidney disease) stage 3, GFR 30-59 ml/min (HCC)   Essential hypertension Elevated BP, suboptimal control With CKD followed by Nephro - request record of last lab Repeat manual  improve but still elevated Offered change of meds, patient/caregiver hesitant to change, in past had issues with other meds side effects Continue current regimen Follow-up with Nephrology as planned    Hyperlipidemia Due for fasting lipids, return for labs    Hypothyroidism - Primary Due for TSH thyroid panel, last done >6 months ago and was low had dose adjust lower levothyroxine Clinically stable Will return for labs soon 07/01/18 future orders placed    Insomnia Stable Continue current meds    Other Visit Diagnoses    Needs flu shot       Relevant Orders   Flu vaccine HIGH DOSE PF (Completed)      No orders of the defined types were placed in this encounter.  Orders Placed This Encounter  Procedures  . Flu vaccine HIGH DOSE PF  . TSH    Standing Status:   Future    Standing Expiration Date:   07/28/2018  . T4, free    Standing Status:   Future    Standing Expiration Date:   07/28/2018  . Hemoglobin A1c    Standing Status:   Future    Standing Expiration Date:   07/28/2018  . Lipid panel    Standing Status:   Future    Standing Expiration Date:   07/28/2018    Order Specific Question:   Has the patient fasted?    Answer:   Yes      Follow up plan: Return in about 6 months (around 12/15/2018) for 6 month follow-up HTN, CKD.  Future labs ordered 07/01/18  Nobie Putnam, [facility] 06/17/2018, 1:07 AM

## 2018-06-16 NOTE — Patient Instructions (Addendum)
Thank you for coming to the office today.  BP is still mildly elevated. Will leave medicines the same for now. Keep me posted if any changes.  For R Flank Pain side pain - most likely muscular or rib involved. Can use topical heating pad and muscle rub, also try a spoonful of yellow mustard if muscle cramping for a home remedy.  If not improved or keeping awake at night - call us and we can send in a low dose Flexeril again at night - 5mg  or cut in half for 2.5mg  ONLY BEDTIME because will likely make her sleepy  ----  We will request labs from Dr Juleen China and review record, will place orders for likely Thyroid / Cholesterol / Sugar check  DUE for FASTING BLOOD WORK (no food or drink after midnight before the lab appointment, only water or coffee without cream/sugar on the morning of)  SCHEDULE "Lab Only" visit in the morning at the clinic for lab draw in 1-2 WEEKS   - Make sure Lab Only appointment is at about 1 week before your next appointment, so that results will be available  For Lab Results, once available within 2-3 days of blood draw, you can can log in to MyChart online to view your results and a brief explanation. Also, we can discuss results at next follow-up visit.  Flu Shot today, will take 2 weeks to take full effect  Please schedule a Follow-up Appointment to: Return in about 6 months (around 12/15/2018) for 6 month follow-up HTN, CKD.  If you have any other questions or concerns, please feel free to call the office or send a message through [place]. You may also schedule an earlier appointment if necessary.  Additionally, you may be receiving a survey about your experience at our office within a few days to 1 week by e-mail or mail. We value your feedback.  Nobie Putnam, DO [place]

## 2018-06-17 ENCOUNTER — Encounter: Payer: Self-pay | Admitting: Family Medicine

## 2018-07-01 ENCOUNTER — Other Ambulatory Visit: Payer: Medicare Other

## 2018-07-01 DIAGNOSIS — R7309 Other abnormal glucose: Secondary | ICD-10-CM | POA: Diagnosis not present

## 2018-07-01 DIAGNOSIS — E782 Mixed hyperlipidemia: Secondary | ICD-10-CM | POA: Diagnosis not present

## 2018-07-01 DIAGNOSIS — E034 Atrophy of thyroid (acquired): Secondary | ICD-10-CM

## 2018-07-02 LAB — HEMOGLOBIN A1C
Hgb A1c MFr Bld: 5.6 % of total Hgb (ref <5.7)
Mean Plasma Glucose: 114 (calc)
eAG (mmol/L): 6.3 (calc)

## 2018-07-02 LAB — T4, FREE: Free T4: 1.8 ng/dL (ref 0.8–1.8)

## 2018-07-02 LAB — LIPID PANEL
Cholesterol: 271 mg/dL — ABNORMAL HIGH (ref <200)
HDL: 45 mg/dL — ABNORMAL LOW (ref >50)
LDL Cholesterol (Calc): 193 mg/dL (calc) — ABNORMAL HIGH
Non-HDL Cholesterol (Calc): 226 mg/dL (calc) — ABNORMAL HIGH (ref <130)
Total CHOL/HDL Ratio: 6 (calc) — ABNORMAL HIGH (ref <5.0)
Triglycerides: 163 mg/dL — ABNORMAL HIGH (ref <150)

## 2018-07-02 LAB — TSH: TSH: 1.28 mIU/L (ref 0.40–4.50)

## 2018-07-10 ENCOUNTER — Other Ambulatory Visit: Payer: Self-pay | Admitting: Family Medicine

## 2018-07-10 NOTE — Telephone Encounter (Signed)
Pt. Daughter  Called requesting refill on  Amlodipine 5mg  [facility]

## 2018-07-10 NOTE — Telephone Encounter (Signed)
This was refill on 9/19 and I called daughter and updated her.

## 2018-08-05 ENCOUNTER — Other Ambulatory Visit: Payer: Self-pay | Admitting: Family Medicine

## 2018-08-05 DIAGNOSIS — N390 Urinary tract infection, site not specified: Secondary | ICD-10-CM

## 2018-08-11 ENCOUNTER — Ambulatory Visit
Admission: RE | Admit: 2018-08-11 | Discharge: 2018-08-11 | Disposition: A | Discharge status: Home / Self Care | Payer: Medicare Other | Source: Ambulatory Visit | Attending: Pain Medicine | Admitting: Pain Medicine

## 2018-08-11 ENCOUNTER — Encounter: Payer: Self-pay | Admitting: Pain Medicine

## 2018-08-11 ENCOUNTER — Other Ambulatory Visit: Payer: Self-pay

## 2018-08-11 ENCOUNTER — Ambulatory Visit (HOSPITAL_BASED_OUTPATIENT_CLINIC_OR_DEPARTMENT_OTHER): Payer: Medicare Other | Admitting: Pain Medicine

## 2018-08-11 VITALS — BP 175/71 | HR 61 | Temp 98.3°F | Resp 12 | Ht 66.0 in | Wt 126.0 lb

## 2018-08-11 DIAGNOSIS — G8929 Other chronic pain: Secondary | ICD-10-CM | POA: Diagnosis not present

## 2018-08-11 DIAGNOSIS — M4317 Spondylolisthesis, lumbosacral region: Secondary | ICD-10-CM

## 2018-08-11 DIAGNOSIS — I714 Abdominal aortic aneurysm, without rupture, unspecified: Secondary | ICD-10-CM

## 2018-08-11 DIAGNOSIS — M47817 Spondylosis without myelopathy or radiculopathy, lumbosacral region: Secondary | ICD-10-CM

## 2018-08-11 DIAGNOSIS — M545 Low back pain, unspecified: Secondary | ICD-10-CM

## 2018-08-11 DIAGNOSIS — M47816 Spondylosis without myelopathy or radiculopathy, lumbar region: Secondary | ICD-10-CM

## 2018-08-11 DIAGNOSIS — M48061 Spinal stenosis, lumbar region without neurogenic claudication: Secondary | ICD-10-CM | POA: Insufficient documentation

## 2018-08-11 DIAGNOSIS — M4854XS Collapsed vertebra, not elsewhere classified, thoracic region, sequela of fracture: Secondary | ICD-10-CM | POA: Insufficient documentation

## 2018-08-11 DIAGNOSIS — S22080S Wedge compression fracture of T11-T12 vertebra, sequela: Secondary | ICD-10-CM | POA: Insufficient documentation

## 2018-08-11 MED ORDER — TRIAMCINOLONE ACETONIDE 40 MG/ML IJ SUSP
80.0000 mg | Freq: Once | INTRAMUSCULAR | Status: AC
  Administered 2018-08-11: 80 mg
  Filled 2018-08-11: qty 2

## 2018-08-11 MED ORDER — LIDOCAINE HCL 2 % IJ SOLN
20.0000 mL | Freq: Once | INTRAMUSCULAR | Status: AC
  Administered 2018-08-11: 400 mg
  Filled 2018-08-11: qty 20

## 2018-08-11 MED ORDER — ROPIVACAINE HCL 2 MG/ML IJ SOLN
18.0000 mL | Freq: Once | INTRAMUSCULAR | Status: AC
  Administered 2018-08-11: 18 mL via PERINEURAL
  Filled 2018-08-11: qty 20

## 2018-08-11 NOTE — Patient Instructions (Signed)

## 2018-08-11 NOTE — Progress Notes (Signed)
Patient's Name: Brittney Simpson  MRN: [id number]  Referring Provider: Nobie Putnam *  DOB: 07/25/1928  PCP: Olin Hauser, DO  DOS: 08/11/2018  Note by: Gaspar Cola, MD  Service setting: Ambulatory outpatient  Specialty: Interventional Pain Management  Patient type: Established  Location: [facility] (AMB) Pain Management Facility  Visit type: Interventional Procedure   Primary Reason for Visit: Interventional Pain Management Treatment. CC: Back Pain  Procedure:          Anesthesia, Analgesia, Anxiolysis:  Type: Lumbar Facet, Medial Branch Block(s) #7  Primary Purpose: Diagnostic Region: Posterolateral Lumbosacral Spine Level: L2, L3, L4, L5, & S1 Medial Branch Level(s). Injecting these levels blocks the L3-4, L4-5, and L5-S1 lumbar facet joints. Laterality: Bilateral  Type: Local Anesthesia Indication(s): Analgesia         Route: Infiltration ([place]/[facility]) IV Access: Declined Sedation: Declined  Local Anesthetic: Lidocaine 1-2%  Position: Prone   Indications: 1. Spondylosis without myelopathy or radiculopathy, lumbosacral region   2. Lumbar facet syndrome (Bilateral) (R>L)   3. Spondylolisthesis of lumbosacral region (L2-3 and L5-S1)   4. Chronic low back pain (Bilateral) (R>L)   5. Lumbar facet hypertrophy    Pain Score: Pre-procedure: 8 /10 Post-procedure: 0-No pain/10  Pre-op Assessment:  Brittney Simpson is a 85 y.o. (year old), female patient, seen today for interventional treatment. She  has a past surgical history that includes Hernia repair; Hernia repair; Hemorrhoid surgery; and Kyphoplasty (N/A, 01/23/2017). Brittney Simpson has a current medication list which includes the following prescription(s): acetaminophen, amlodipine, lidocaine, brimonidine, ciprofloxacin, clonazepam, cranberry, vitamin d2, furosemide, latanoprost, levothyroxine, lisinopril, metoprolol succinate, omeprazole, timolol, tramadol, and trazodone. Her primarily concern today is the Back  Pain  Initial Vital Signs:  Pulse/HCG Rate: 61ECG Heart Rate: 65 Temp: 98.3 F (36.8 C) Resp: 15 BP: (!) 174/76 SpO2: 98 %  BMI: Estimated body mass index is 20.34 kg/m as calculated from the following:   Height as of this encounter: 5\' 6"  (1.676 m).   Weight as of this encounter: 126 lb (57.2 kg).  Risk Assessment: Allergies: Reviewed. She has No Known Allergies.  Allergy Precautions: None required Coagulopathies: Reviewed. None identified.  Blood-thinner therapy: None at this time Active Infection(s): Reviewed. None identified. Brittney Simpson is afebrile  Site Confirmation: Brittney Simpson was asked to confirm the procedure and laterality before marking the site Procedure checklist: Completed Consent: Before the procedure and under the influence of no sedative(s), amnesic(s), or anxiolytics, the patient was informed of the treatment options, risks and possible complications. To fulfill our ethical and legal obligations, as recommended by the American Medical Association's Code of Ethics, I have informed the patient of my clinical impression; the nature and purpose of the treatment or procedure; the risks, benefits, and possible complications of the intervention; the alternatives, including doing nothing; the risk(s) and benefit(s) of the alternative treatment(s) or procedure(s); and the risk(s) and benefit(s) of doing nothing. The patient was provided information about the general risks and possible complications associated with the procedure. These may include, but are not limited to: failure to achieve desired goals, infection, bleeding, organ or nerve damage, allergic reactions, paralysis, and death. In addition, the patient was informed of those risks and complications associated to Spine-related procedures, such as failure to decrease pain; infection (i.e.: Meningitis, epidural or intraspinal abscess); bleeding (i.e.: epidural hematoma, subarachnoid hemorrhage, or any other type of  intraspinal or peri-dural bleeding); organ or nerve damage (i.e.: Any type of peripheral nerve, nerve root, or spinal cord injury) with subsequent  damage to sensory, motor, and/or autonomic systems, resulting in permanent pain, numbness, and/or weakness of one or several areas of the body; allergic reactions; (i.e.: anaphylactic reaction); and/or death. Furthermore, the patient was informed of those risks and complications associated with the medications. These include, but are not limited to: allergic reactions (i.e.: anaphylactic or anaphylactoid reaction(s)); adrenal axis suppression; blood sugar elevation that in diabetics may result in ketoacidosis or comma; water retention that in patients with history of congestive heart failure may result in shortness of breath, pulmonary edema, and decompensation with resultant heart failure; weight gain; swelling or edema; medication-induced neural toxicity; particulate matter embolism and blood vessel occlusion with resultant organ, and/or nervous system infarction; and/or aseptic necrosis of one or more joints. Finally, the patient was informed that Medicine is not an exact science; therefore, there is also the possibility of unforeseen or unpredictable risks and/or possible complications that may result in a catastrophic outcome. The patient indicated having understood very clearly. We have given the patient no guarantees and we have made no promises. Enough time was given to the patient to ask questions, all of which were answered to the patient's satisfaction. Brittney Simpson has indicated that she wanted to continue with the procedure. Attestation: I, the ordering provider, attest that I have discussed with the patient the benefits, risks, side-effects, alternatives, likelihood of achieving goals, and potential problems during recovery for the procedure that I have provided informed consent. Date  Time: 08/11/2018  8:37 AM  Pre-Procedure Preparation:  Monitoring:  As per clinic protocol. Respiration, ETCO2, SpO2, BP, heart rate and rhythm monitor placed and checked for adequate function Safety Precautions: Patient was assessed for positional comfort and pressure points before starting the procedure. Time-out: I initiated and conducted the "Time-out" before starting the procedure, as per protocol. The patient was asked to participate by confirming the accuracy of the "Time Out" information. Verification of the correct person, site, and procedure were performed and confirmed by me, the nursing staff, and the patient. "Time-out" conducted as per Joint Commission's Universal Protocol (UP.01.01.01). Time: 0933  Description of Procedure:          Laterality: Bilateral. The procedure was performed in identical fashion on both sides. Levels:  L2, L3, L4, L5, & S1 Medial Branch Level(s) Area Prepped: Posterior Lumbosacral Region Prepping solution: ChloraPrep (2% chlorhexidine gluconate and 70% isopropyl alcohol) Safety Precautions: Aspiration looking for blood return was conducted prior to all injections. At no point did we inject any substances, as a needle was being advanced. Before injecting, the patient was told to immediately notify me if she was experiencing any new onset of "ringing in the ears, or metallic taste in the mouth". No attempts were made at seeking any paresthesias. Safe injection practices and needle disposal techniques used. Medications properly checked for expiration dates. SDV (single dose vial) medications used. After the completion of the procedure, all disposable equipment used was discarded in the proper designated medical waste containers. Local Anesthesia: Protocol guidelines were followed. The patient was positioned over the fluoroscopy table. The area was prepped in the usual manner. The time-out was completed. The target area was identified using fluoroscopy. A 12-in long, straight, sterile hemostat was used with fluoroscopic guidance to  locate the targets for each level blocked. Once located, the skin was marked with an approved surgical skin marker. Once all sites were marked, the skin (epidermis, dermis, and hypodermis), as well as deeper tissues (fat, connective tissue and muscle) were infiltrated with a small amount of  a short-acting local anesthetic, loaded on a 10cc syringe with a 25G, 1.5-in  Needle. An appropriate amount of time was allowed for local anesthetics to take effect before proceeding to the next step. Local Anesthetic: Lidocaine 2.0% The unused portion of the local anesthetic was discarded in the proper designated containers. Technical explanation of process:  L2 Medial Branch Nerve Block (MBB): The target area for the L2 medial branch is at the junction of the postero-lateral aspect of the superior articular process and the superior, posterior, and medial edge of the transverse process of L3. Under fluoroscopic guidance, a Quincke needle was inserted until contact was made with os over the superior postero-lateral aspect of the pedicular shadow (target area). After negative aspiration for blood, 0.5 mL of the nerve block solution was injected without difficulty or complication. The needle was removed intact. L3 Medial Branch Nerve Block (MBB): The target area for the L3 medial branch is at the junction of the postero-lateral aspect of the superior articular process and the superior, posterior, and medial edge of the transverse process of L4. Under fluoroscopic guidance, a Quincke needle was inserted until contact was made with os over the superior postero-lateral aspect of the pedicular shadow (target area). After negative aspiration for blood, 0.5 mL of the nerve block solution was injected without difficulty or complication. The needle was removed intact. L4 Medial Branch Nerve Block (MBB): The target area for the L4 medial branch is at the junction of the postero-lateral aspect of the superior articular process and the  superior, posterior, and medial edge of the transverse process of L5. Under fluoroscopic guidance, a Quincke needle was inserted until contact was made with os over the superior postero-lateral aspect of the pedicular shadow (target area). After negative aspiration for blood, 0.5 mL of the nerve block solution was injected without difficulty or complication. The needle was removed intact. L5 Medial Branch Nerve Block (MBB): The target area for the L5 medial branch is at the junction of the postero-lateral aspect of the superior articular process and the superior, posterior, and medial edge of the sacral ala. Under fluoroscopic guidance, a Quincke needle was inserted until contact was made with os over the superior postero-lateral aspect of the pedicular shadow (target area). After negative aspiration for blood, 0.5 mL of the nerve block solution was injected without difficulty or complication. The needle was removed intact. S1 Medial Branch Nerve Block (MBB): The target area for the S1 medial branch is at the posterior and inferior 6 o'clock position of the L5-S1 facet joint. Under fluoroscopic guidance, the Quincke needle inserted for the L5 MBB was redirected until contact was made with os over the inferior and postero aspect of the sacrum, at the 6 o' clock position under the L5-S1 facet joint (Target area). After negative aspiration for blood, 0.5 mL of the nerve block solution was injected without difficulty or complication. The needle was removed intact. Procedural Needles: 22-gauge, 3.5-inch, Quincke needles used for all levels. Nerve block solution: 0.2% PF-Ropivacaine + Triamcinolone (40 mg/mL) diluted to a final concentration of 4 mg of Triamcinolone/mL of Ropivacaine The unused portion of the solution was discarded in the proper designated containers.  Once the entire procedure was completed, the treated area was cleaned, making sure to leave some of the prepping solution back to take advantage of  its long term bactericidal properties.   Illustration of the posterior view of the lumbar spine and the posterior neural structures. Laminae of L2 through S1 are labeled. DPRL5,  dorsal primary ramus of L5; DPRS1, dorsal primary ramus of S1; DPR3, dorsal primary ramus of L3; FJ, facet (zygapophyseal) joint L3-L4; I, inferior articular process of L4; LB1, lateral branch of dorsal primary ramus of L1; IAB, inferior articular branches from L3 medial branch (supplies L4-L5 facet joint); IBP, intermediate branch plexus; MB3, medial branch of dorsal primary ramus of L3; NR3, third lumbar nerve root; S, superior articular process of L5; SAB, superior articular branches from L4 (supplies L4-5 facet joint also); TP3, transverse process of L3.  Vitals:   08/11/18 0925 08/11/18 0930 08/11/18 0935 08/11/18 0942  BP: (!) 176/71 (!) 179/77 (!) 170/72 (!) 175/71  Pulse:      Resp: 11 15 12 12   Temp:      SpO2: 96% 94% 94% 95%  Weight:      Height:         Start Time: 0933 hrs. End Time: 0944 hrs.  Imaging Guidance (Spinal):          Type of Imaging Technique: Fluoroscopy Guidance (Spinal) Indication(s): Assistance in needle guidance and placement for procedures requiring needle placement in or near specific anatomical locations not easily accessible without such assistance. Exposure Time: Please see nurses notes. Contrast: None used. Fluoroscopic Guidance: I was personally present during the use of fluoroscopy. "Tunnel Vision Technique" used to obtain the best possible view of the target area. Parallax error corrected before commencing the procedure. "Direction-depth-direction" technique used to introduce the needle under continuous pulsed fluoroscopy. Once target was reached, antero-posterior, oblique, and lateral fluoroscopic projection used confirm needle placement in all planes. Images permanently stored in EMR. Interpretation: No contrast injected. I personally interpreted the imaging intraoperatively.  Adequate needle placement confirmed in multiple planes. Permanent images saved into the patient's record.  Antibiotic Prophylaxis:   Anti-infectives (From admission, onward)   None     Indication(s): None identified  Post-operative Assessment:  Post-procedure Vital Signs:  Pulse/HCG Rate: 6163 Temp: 98.3 F (36.8 C) Resp: 12 BP: (!) 175/71 SpO2: 95 %  EBL: None  Complications: No immediate post-treatment complications observed by team, or reported by patient.  Note: The patient tolerated the entire procedure well. A repeat set of vitals were taken after the procedure and the patient was kept under observation following institutional policy, for this type of procedure. Post-procedural neurological assessment was performed, showing return to baseline, prior to discharge. The patient was provided with post-procedure discharge instructions, including a section on how to identify potential problems. Should any problems arise concerning this procedure, the patient was given instructions to immediately contact us, at any time, without hesitation. In any case, we plan to contact the patient by telephone for a follow-up status report regarding this interventional procedure.  Comments:  No additional relevant information.  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  In addition I will send a letter to the patient's primary care physician so that he can follow-up with those findings. PRN palliative bilateral lumbar facet blocks under fluoroscopic guidance, no sedation.  They usually last 4 to 6 months for her.   Considering: Palliativebilateral lumbar facet block  Possible bilateral lumbar facet radiofrequency ablation.   Diagnostic right-sided intra-articular hip joint injection Palliative right-sided L3-4 lumbar epidural steroid injection Diagnostic right-sided transforaminal L3-4 epidural steroid injection Diagnostic right-sided trochanteric  bursa injection Diagnostic right-sided sacroiliac joint block   Palliative PRN treatment(s): Palliativebilateral lumbar facet block    Imaging Orders     CT Angio Abd/Pel w/ and/or w/o     DG C-Arm 1-60 Min-No Report  Procedure Orders     LUMBAR FACET(MEDIAL BRANCH NERVE BLOCK) MBNB  Medications ordered for procedure: Meds ordered this encounter  Medications  . lidocaine (XYLOCAINE) 2 % (with pres) injection 400 mg  . ropivacaine (PF) 2 mg/mL (0.2%) (NAROPIN) injection 18 mL  . triamcinolone acetonide (KENALOG-40) injection 80 mg   Medications administered: We administered lidocaine, ropivacaine (PF) 2 mg/mL (0.2%), and triamcinolone acetonide.  See the medical record for exact dosing, route, and time of administration.  Disposition: Discharge home  Discharge Date & Time: 08/11/2018; 0954 hrs.   Physician-requested Follow-up: Return for post-procedure eval (2 wks), w/ Dionisio David, NP.  Future Appointments  Date Time Provider [place]  08/25/2018  9:15 AM Vevelyn Francois, NP [facility] None  09/15/2018  8:30 AM [facility] 2 [facility] None  09/15/2018  9:45 AM Dew, Erskine Squibb, MD [facility] None  11/04/2018 11:00 AM Vevelyn Francois, NP [facility] None  12/16/2018  4:00 PM Parks Ranger, Devonne Doughty, DO [facility] None   Primary Care Physician: Olin Hauser, DO Location: [facility] Out[facility] Management Facility Note by: Gaspar Cola, MD Date: 08/11/2018; Time: 9:59 AM  Disclaimer:  Medicine is not an Chief Strategy Officer. The only guarantee in medicine is that nothing is guaranteed. It is important to note that the decision to proceed with this intervention was based on the information collected from the patient. The Data and conclusions  were drawn from the patient's questionnaire, the interview, and the physical examination. Because the information was provided in large part by the patient, it cannot be guaranteed that it has not been purposely or unconsciously manipulated. Every effort has been made to obtain as much relevant data as possible for this evaluation. It is important to note that the conclusions that lead to this procedure are derived in large part from the available data. Always take into account that the treatment will also be dependent on availability of resources and existing treatment guidelines, considered by other Pain Management Practitioners as being common knowledge and practice, at the time of the intervention. For Medico-Legal purposes, it is also important to point out that variation in procedural techniques and pharmacological choices are the acceptable norm. The indications, contraindications, technique, and results of the above procedure should only be interpreted and judged by a Board-Certified Interventional Pain Specialist with extensive familiarity and expertise in the same exact procedure and technique.

## 2018-08-12 ENCOUNTER — Telehealth: Payer: Self-pay | Admitting: *Deleted

## 2018-08-12 NOTE — Telephone Encounter (Signed)
Spoke with patient's daughter, no problems post procedure. 

## 2018-08-19 ENCOUNTER — Ambulatory Visit
Admission: RE | Admit: 2018-08-19 | Discharge: 2018-08-19 | Disposition: A | Discharge status: Home / Self Care | Payer: Medicare Other | Source: Ambulatory Visit | Attending: Pain Medicine | Admitting: Pain Medicine

## 2018-08-19 DIAGNOSIS — I714 Abdominal aortic aneurysm, without rupture, unspecified: Secondary | ICD-10-CM

## 2018-08-19 LAB — POCT I-STAT CREATININE: Creatinine, Ser: 0.9 mg/dL (ref 0.44–1.00)

## 2018-08-19 MED ORDER — IOPAMIDOL (ISOVUE-370) INJECTION 76%
85.0000 mL | Freq: Once | INTRAVENOUS | Status: AC | PRN
  Administered 2018-08-19: 85 mL via INTRAVENOUS

## 2018-08-25 ENCOUNTER — Encounter: Payer: Self-pay | Admitting: Family Medicine

## 2018-08-25 ENCOUNTER — Other Ambulatory Visit: Payer: Self-pay

## 2018-08-25 ENCOUNTER — Ambulatory Visit: Payer: BLUE CROSS/BLUE SHIELD | Admitting: Nurse Practitioner

## 2018-08-25 ENCOUNTER — Ambulatory Visit (INDEPENDENT_AMBULATORY_CARE_PROVIDER_SITE_OTHER): Payer: Medicare Other | Admitting: Family Medicine

## 2018-08-25 ENCOUNTER — Other Ambulatory Visit: Payer: Self-pay | Admitting: Family Medicine

## 2018-08-25 VITALS — BP 150/64 | HR 69 | Temp 98.4°F | Resp 15 | Ht 66.0 in | Wt 124.8 lb

## 2018-08-25 DIAGNOSIS — J019 Acute sinusitis, unspecified: Secondary | ICD-10-CM | POA: Diagnosis not present

## 2018-08-25 DIAGNOSIS — N390 Urinary tract infection, site not specified: Secondary | ICD-10-CM

## 2018-08-25 DIAGNOSIS — R05 Cough: Secondary | ICD-10-CM | POA: Diagnosis not present

## 2018-08-25 DIAGNOSIS — R059 Cough, unspecified: Secondary | ICD-10-CM

## 2018-08-25 MED ORDER — IPRATROPIUM BROMIDE 0.06 % NA SOLN: 2.0000 | Freq: Four times a day (QID) | NASAL | 0 refills | Status: DC

## 2018-08-25 MED ORDER — GUAIFENESIN-CODEINE 100-10 MG/5ML PO SYRP: 5.0000 mL | ORAL_SOLUTION | Freq: Every evening | ORAL | 0 refills | Status: DC | PRN

## 2018-08-25 MED ORDER — CIPROFLOXACIN HCL 500 MG PO TABS: 500.0000 mg | ORAL_TABLET | Freq: Two times a day (BID) | ORAL | 2 refills | Status: DC

## 2018-08-25 NOTE — Progress Notes (Signed)
Subjective:    Patient ID: Brittney Simpson, female    DOB: [birth year], 85 y.o.   MRN: [id number]  Brittney Simpson is a 85 y.o. female presenting on 08/25/2018 for Cough (with a scratchy throat, running nose, past 1-3 days, no chills or pain )  Patient presents for a same day appointment. History provided by patient's daughter, [name]. Patient provides some history, limited by hearing.  HPI   SINUSITIS / COUGH / URI Reports symptoms started 2-3 days ago with nasal congestion and runny nose, and drainage persisted now to today with some cough, sore throat with hoarse voice. Tolerating PO well. - Not taking Tylenol - Not taking any cough medication currently OTC. Prior tessalon has not worked for her in past - Her daughter wanted to get her checked out before illness worsened. She has multiple sick contacts recently with URI symptoms. - Denies fevers, chills, sweats, nausea vomiting, body aches, abdominal pain, sinus pain or pressure, ear pain  Health Maintenance: UTD Flu Vaccine 05/2018  Depression screen [facility] 2/9 08/25/2018 06/16/2018 05/05/2018  Decreased Interest 0 0 0  Down, Depressed, Hopeless 0 0 0  PHQ - 2 Score 0 0 0  Altered sleeping 0 - -  Tired, decreased energy 0 - -  Change in appetite 0 - -  Trouble concentrating 0 - -  Moving slowly or fidgety/restless 0 - -  Suicidal thoughts 0 - -  PHQ-9 Score 0 - -  Difficult doing work/chores Not difficult at all - -    Social History   Tobacco Use  . Smoking status: Never Smoker  . Smokeless tobacco: Never Used  Substance Use Topics  . Alcohol use: No    Alcohol/week: 0.0 standard drinks  . Drug use: No    Review of Systems Per HPI unless specifically indicated above     Objective:    BP (!) 150/64 (BP Location: Left Arm, Cuff Size: Normal)   Pulse 69   Temp 98.4 F (36.9 C) (Oral)   Resp 15   Ht 5\' 6"  (1.676 m)   Wt 124 lb 12.8 oz (56.6 kg)   SpO2 98%   BMI 20.14 kg/m   Wt Readings from Last 3  Encounters:  08/25/18 124 lb 12.8 oz (56.6 kg)  08/11/18 126 lb (57.2 kg)  06/16/18 127 lb (57.6 kg)    Physical Exam Vitals signs and nursing note reviewed.  Constitutional:      General: She is not in acute distress.    Appearance: She is well-developed. She is not diaphoretic.     Comments: Well-appearing, comfortable, cooperative  HENT:     Head: Normocephalic and atraumatic.     Comments: Frontal / maxillary sinuses non-tender. Nares patent without purulence or edema. Bilateral TMs clear without erythema, effusion or bulging. Oropharynx mild posterior drainage without erythema, exudates, edema or asymmetry. Eyes:     General:        Right eye: No discharge.        Left eye: No discharge.     Conjunctiva/sclera: Conjunctivae normal.  Neck:     Musculoskeletal: Normal range of motion and neck supple.     Thyroid: No thyromegaly.  Cardiovascular:     Rate and Rhythm: Normal rate and regular rhythm.     Heart sounds: Normal heart sounds. No murmur.  Pulmonary:     Effort: Pulmonary effort is normal. No respiratory distress.     Breath sounds: Normal breath sounds. No wheezing or rales.  Comments: Occasional cough. Good air movement. No focal abnormality Musculoskeletal: Normal range of motion.  Lymphadenopathy:     Cervical: No cervical adenopathy.  Skin:    General: Skin is warm and dry.     Findings: No erythema or rash.  Neurological:     Mental Status: She is alert and oriented to person, place, and time.  Psychiatric:        Behavior: Behavior normal.     Comments: Well groomed, good eye contact, normal speech and thoughts    Results for orders placed or performed during the hospital encounter of 08/19/18  I-STAT creatinine  Result Value Ref Range   Creatinine, Ser 0.90 0.44 - 1.00 mg/dL      Assessment & Plan:   Problem List Items Addressed This Visit    None    Visit Diagnoses    Acute rhinosinusitis    -  Primary   Relevant Medications   ipratropium  (ATROVENT) 0.06 % nasal spray   guaiFENesin-codeine (ROBITUSSIN AC) 100-10 MG/5ML syrup   Cough       Relevant Medications   guaiFENesin-codeine (ROBITUSSIN AC) 100-10 MG/5ML syrup      Consistent with acute rhinosinusitis, likely initially viral URI vs allergic rhinitis component without evidence of bacterial infection. Multiple sick contacts, most likely early viral symptoms.  Plan: 1. Reassurance, likely self-limited - no indication for antibiotics at this time 2. Start Atrovent nasal spray decongestant 2 sprays in each nostril up to 4 times daily for 7 days 3. Cough syrup codeine nightly only caution sedation 4. Mucinex regular without additives, daily PRN cough 5. Return criteria reviewed - advised that if significant worse or fevers, sinus pain, worse cough or dyspnea - would send in antibiotic course likely augmentin for sinusitis - by Friday 1/31 or early next week if indicated.   Meds ordered this encounter  Medications  . ipratropium (ATROVENT) 0.06 % nasal spray    Sig: Place 2 sprays into both nostrils 4 (four) times daily. For up to 5-7 days then stop.    Dispense:  15 mL    Refill:  0  . guaiFENesin-codeine (ROBITUSSIN AC) 100-10 MG/5ML syrup    Sig: Take 5-10 mLs by mouth at bedtime as needed for cough.    Dispense:  118 mL    Refill:  0     Follow up plan: Return in about 1 week (around 09/01/2018), or if symptoms worsen or fail to improve, for URI / sinus.   Nobie Putnam, [facility] 08/25/2018, 12:03 PM

## 2018-08-25 NOTE — Telephone Encounter (Signed)
Requested refill cipro to keep on file. Takes occasionally if early symptoms of UTI. See prior notes for recurrent UTI history.  Nobie Putnam, [facility] 08/25/2018, 12:33 PM

## 2018-08-25 NOTE — Patient Instructions (Addendum)
Thank you for coming to the office today.  1. It sounds like you have persistent Sinus Congestion or "Rhinosinusitis" - I do not think that this is a Bacterial Sinus Infection. Usually these are caused by Viruses or Allergies, and will run it's course in about 7 to 10 days. - No antibiotics are needed at this time.  Start Atrovent nasal spray decongestant 2 sprays in each nostril up to 4 times daily for 7 days  Try Cough Syrup with codeine, caution sedation, use small amount at night only  May try plain mucinex to clear congestion if interested, not with additives  Recommend to start taking Tylenol Extra Strength 500mg  tabs - take 1 to 2 tabs per dose (max 1000mg ) every 6-8 hours for sore throat or pain, max 24 hour daily dose is 6 tablets or 3000mg .  - Recommend to start using Nasal Saline spray multiple times a day to help flush out congestion and clear sinuses - Improve hydration by drinking plenty of clear fluids (water, gatorade) to reduce secretions and thin congestion - Congestion draining down throat can cause irritation. May try warm herbal tea with honey, cough drops  May call back later this week by Friday or next week early Monday if not improved we can send in an antibiotic  If you develop persistent fever >101F for at least 3 consecutive days, headaches with sinus pain or pressure or persistent earache, please schedule a follow-up evaluation within next few days to week.   Please schedule a Follow-up Appointment to: Return in about 1 week (around 09/01/2018), or if symptoms worsen or fail to improve, for URI / sinus.  If you have any other questions or concerns, please feel free to call the office or send a message through [place]. You may also schedule an earlier appointment if necessary.  Additionally, you may be receiving a survey about your experience at our office within a few days to 1 week by e-mail or mail. We value your feedback.  Nobie Putnam, DO [place]

## 2018-08-26 ENCOUNTER — Telehealth: Payer: Self-pay | Admitting: Family Medicine

## 2018-08-26 NOTE — Telephone Encounter (Signed)
Left message for patient to call back  

## 2018-08-26 NOTE — Telephone Encounter (Signed)
She was seen yesterday 1/28.  Please let her know that I am not convinced that this is a bacterial sinus infection at this time. Most likely it is still viral infection as we have seen a lot of similar patients. It should slowly improve on it's own.  I am trying to follow the guidelines for safe antibiotic prescribing. I would still prefer to wait until at least Friday 1/31 before sending an antibiotic for her sinus if she is not improved.  Nobie Putnam, [facility] 08/26/2018, 12:46 PM

## 2018-08-26 NOTE — Telephone Encounter (Signed)
Pt called daughter, then  Daughter called said that pt said that she was not any better  And wanted to know if you would call that antibiotic in today . She was aware that you did say wait until tomorrow. Daughter call back # is  [phone]  [facility]

## 2018-08-26 NOTE — Telephone Encounter (Signed)
Patient's daughter advised.  

## 2018-08-28 ENCOUNTER — Telehealth: Payer: Self-pay | Admitting: Family Medicine

## 2018-08-28 DIAGNOSIS — J0100 Acute maxillary sinusitis, unspecified: Secondary | ICD-10-CM

## 2018-08-28 DIAGNOSIS — H401133 Primary open-angle glaucoma, bilateral, severe stage: Secondary | ICD-10-CM | POA: Diagnosis not present

## 2018-08-28 MED ORDER — AMOXICILLIN-POT CLAVULANATE 875-125 MG PO TABS: 1.0000 | ORAL_TABLET | Freq: Two times a day (BID) | ORAL | 0 refills | Status: DC

## 2018-08-28 NOTE — Telephone Encounter (Signed)
Followed up and there is a message from 08/26/18.  Please advise

## 2018-08-28 NOTE — Telephone Encounter (Signed)
Please send antibiotic to [facility].  Carol's call back [phone]

## 2018-08-28 NOTE — Telephone Encounter (Signed)
Sent augmentin antibiotic as discussed previously due to duration of symptoms  Nobie Putnam, DO [facility] 08/28/2018, 12:21 PM

## 2018-09-02 ENCOUNTER — Other Ambulatory Visit: Payer: Self-pay | Admitting: Family Medicine

## 2018-09-02 DIAGNOSIS — F419 Anxiety disorder, unspecified: Secondary | ICD-10-CM

## 2018-09-02 DIAGNOSIS — K219 Gastro-esophageal reflux disease without esophagitis: Secondary | ICD-10-CM

## 2018-09-02 DIAGNOSIS — I10 Essential (primary) hypertension: Secondary | ICD-10-CM

## 2018-09-04 ENCOUNTER — Telehealth: Payer: Self-pay | Admitting: Family Medicine

## 2018-09-04 ENCOUNTER — Other Ambulatory Visit: Payer: Self-pay | Admitting: Family Medicine

## 2018-09-04 DIAGNOSIS — E034 Atrophy of thyroid (acquired): Secondary | ICD-10-CM

## 2018-09-04 NOTE — Telephone Encounter (Signed)
Brittney Simpson said pt is not completely over cold.  She is not coughing a lot but still has head congestion.  Please call [phone]

## 2018-09-07 NOTE — Telephone Encounter (Signed)
Spoke to [place], the patient was not feeling good on Friday but her cold and chest congestion is improved. She is on antibiotics, advised that some time it lingered around for couple of days. She is also taking OTC Coricidin. They will give some time and if it's not improved then will call the office.

## 2018-09-07 NOTE — Telephone Encounter (Signed)
Patient triaged. No further recommendations. Continue current medicine. Likely lingering cough. If significant worsening, can return for further evaluation.  Brittney Simpson, [facility] 09/07/2018, 11:42 AM

## 2018-09-15 ENCOUNTER — Ambulatory Visit (INDEPENDENT_AMBULATORY_CARE_PROVIDER_SITE_OTHER): Payer: Medicare Other

## 2018-09-15 ENCOUNTER — Encounter (INDEPENDENT_AMBULATORY_CARE_PROVIDER_SITE_OTHER): Payer: Self-pay

## 2018-09-15 ENCOUNTER — Encounter (INDEPENDENT_AMBULATORY_CARE_PROVIDER_SITE_OTHER): Payer: Self-pay | Admitting: Vascular Surgery

## 2018-09-15 ENCOUNTER — Ambulatory Visit (INDEPENDENT_AMBULATORY_CARE_PROVIDER_SITE_OTHER): Payer: Medicare Other | Admitting: Vascular Surgery

## 2018-09-15 VITALS — BP 177/69 | HR 62 | Resp 16 | Ht 66.0 in | Wt 127.6 lb

## 2018-09-15 DIAGNOSIS — N183 Chronic kidney disease, stage 3 unspecified: Secondary | ICD-10-CM

## 2018-09-15 DIAGNOSIS — M545 Low back pain: Secondary | ICD-10-CM

## 2018-09-15 DIAGNOSIS — I714 Abdominal aortic aneurysm, without rupture, unspecified: Secondary | ICD-10-CM

## 2018-09-15 DIAGNOSIS — I129 Hypertensive chronic kidney disease with stage 1 through stage 4 chronic kidney disease, or unspecified chronic kidney disease: Secondary | ICD-10-CM | POA: Diagnosis not present

## 2018-09-15 DIAGNOSIS — E782 Mixed hyperlipidemia: Secondary | ICD-10-CM

## 2018-09-15 DIAGNOSIS — G8929 Other chronic pain: Secondary | ICD-10-CM | POA: Diagnosis not present

## 2018-09-15 DIAGNOSIS — I10 Essential (primary) hypertension: Secondary | ICD-10-CM

## 2018-09-15 NOTE — Assessment & Plan Note (Signed)
The CT shows essentially no change in her abdominal aortic aneurysm which measures approximately 4.2 cm at the level of the renal arteries which is the largest diameter.  It is markedly tortuous but there is no sign of rupture, extravasation, or dissection.  This is unchanged and of low risk for rupture at this size.  Certainly below the threshold for prophylactic repair on an asymptomatic 85 year old.  Plan to recheck a duplex in 6 months.  Would prefer duplex follow-up to CT particularly with her renal insufficiency although at the renal level sometimes it is difficult to see with ultrasound.

## 2018-09-15 NOTE — Patient Instructions (Signed)
Abdominal Aortic Aneurysm    An aneurysm is a bulge in one of the blood vessels that carry blood away from the heart (artery). It happens when blood pushes up against a weak or damaged place in the wall of an artery. An abdominal aortic aneurysm happens in the main artery of the body (aorta).  Some aneurysms may not cause problems. If it grows, it can burst or tear, causing bleeding inside the body. This is an emergency. It needs to be treated right away.  What are the causes?  The exact cause of this condition is not known.  What increases the risk?  The following may make you more likely to get this condition:   Being a female who is 60 years of age or older.   Being white (Caucasian).   Using tobacco.   Having a family history of aneurysms.   Having the following conditions:  ? Hardening of the arteries (arteriosclerosis).  ? Inflammation of the walls of an artery (arteritis).  ? Certain genetic conditions.  ? Being very overweight (obesity).  ? An infection in the wall of the aorta (infectious aortitis).  ? High cholesterol.  ? High blood pressure (hypertension).  What are the signs or symptoms?  Symptoms depend on the size of the aneurysm and how fast it is growing. Most grow slowly and do not cause any symptoms. If symptoms do occur, they may include:   Pain in the belly (abdomen), side, or back.   Feeling full after eating only small amounts of food.   Feeling a throbbing lump in the belly.  Symptoms that the aneurysm has burst (ruptured) include:   Sudden, very bad pain in the belly, side, or back.   Feeling sick to your stomach (nauseous).   Throwing up (vomiting).   Feeling light-headed or passing out.  How is this treated?  Treatment for this condition depends on:   The size of the aneurysm.   How fast it is growing.   Your age.   Your risk of having it burst.  If your aneurysm is smaller than 2 inches (5 cm), your doctor may manage it by:   Checking it often to see if it is getting bigger.  You may have an imaging test (ultrasound) to check it every 3-6 months, every year, or every few years.   Giving you medicines to:  ? Control blood pressure.  ? Treat pain.  ? Fight infection.  If your aneurysm is larger than 2 inches (5 cm), you may need surgery to fix it.  Follow these instructions at home:  Lifestyle   Do not use any products that have nicotine or tobacco in them. This includes cigarettes, e-cigarettes, and chewing tobacco. If you need help quitting, ask your doctor.   Get regular exercise. Ask your doctor what types of exercise are best for you.  Eating and drinking   Eat a heart-healthy diet. This includes eating plenty of:  ? Fresh fruits and vegetables.  ? Whole grains.  ? Low-fat (lean) protein.  ? Low-fat dairy products.   Avoid foods that are high in saturated fat and cholesterol. These foods include red meat and some dairy products.   Do not drink alcohol if:  ? Your doctor tells you not to drink.  ? You are pregnant, may be pregnant, or are planning to become pregnant.   If you drink alcohol:  ? Limit how much you use to:   0-1 drink a day for women.     0-2 drinks a day for men.  ? Be aware of how much alcohol is in your drink. In the [place] one drink equals any of these:   One typical bottle of beer (12 oz).   One-half glass of wine (5 oz).   One shot of hard liquor (1 oz).  General instructions   Take over-the-counter and prescription medicines only as told by your doctor.   Keep your blood pressure within normal limits. Ask your doctor what your blood pressure should be.   Have your blood sugar (glucose) level and cholesterol levels checked regularly. Keep your blood sugar level and cholesterol levels within normal limits.   Avoid heavy lifting and activities that take a lot of effort. Ask your doctor what activities are safe for you.   Keep all follow-up visits as told by your doctor. This is important.  ? Talk to your doctor about regular screenings to see if the  aneurysm is getting bigger.  Contact a doctor if you:   Have pain in your belly, side, or back.   Have a throbbing feeling in your belly.   Have a family history of aneurysms.  Get help right away if you:   Have sudden, bad pain in your belly, side, or back.   Feel sick to your stomach.   Throw up.   Have trouble pooping (constipation).   Have trouble peeing (urinating).   Feel light-headed.   Have a fast heart rate when you stand.   Have sweaty skin that is cold to the touch (clammy).   Have shortness of breath.   Have a fever.  These symptoms may be an emergency. Do not wait to see if the symptoms will go away. Get medical help right away. Call your local emergency services (911 in the [place] Do not drive yourself to the hospital.  Summary   An aneurysm is a bulge in one of the blood vessels that carry blood away from the heart (artery). Some aneurysms may not cause problems.   You may need to have yours checked often. If it grows, it can burst or tear. This causes bleeding inside the body. It needs to be treated right away.   Follow instructions from your doctor about healthy lifestyle changes.   Keep all follow-up visits as told by your doctor. This is important.  This information is not intended to replace advice given to you by your health care provider. Make sure you discuss any questions you have with your health care provider.  Document Released: 11/09/2012 Document Revised: 02/21/2018 Document Reviewed: 02/21/2018  Elsevier Interactive Patient Education  2019 [employer]

## 2018-09-15 NOTE — Progress Notes (Signed)
MRN : [id number]  Brittney Simpson is a 85 y.o. (03-05-1928) female who presents with chief complaint of  Chief Complaint  Patient presents with  . Follow-up  .  History of Present Illness: Patient returns today in follow up of her abdominal aortic aneurysm.  She was scheduled to have an aortic duplex today, but she recently underwent a CT scan that I have independently reviewed.  This was ordered by her pain physician.  The CT shows essentially no change in her abdominal aortic aneurysm which measures approximately 4.2 cm at the level of the renal arteries which is the largest diameter.  It is markedly tortuous but there is no sign of rupture, extravasation, or dissection.  No obvious aneurysm related symptoms. Specifically, the patient denies new back or abdominal pain, or signs of peripheral embolization   Current Outpatient Medications  Medication Sig Dispense Refill  . acetaminophen (TYLENOL) 500 MG tablet Take 1,000 mg by mouth daily as needed for moderate pain or headache.    Marland Kitchen amLODipine (NORVASC) 5 MG tablet TAKE 1 TABLET BY MOUTH ONCE DAILY 90 tablet 3  . ASPERCREME LIDOCAINE EX Apply 1 application topically.    . brimonidine (ALPHAGAN) 0.2 % ophthalmic solution Place 1 drop into the left eye 2 (two) times daily.     . ciprofloxacin (CIPRO) 500 MG tablet Take 1 tablet (500 mg total) by mouth 2 (two) times daily. Give 2 hours apart from vitamins, iron, and antacids. - Start if develop new UTI 10 tablet 2  . clonazePAM (KLONOPIN) 0.5 MG tablet TAKE 1/2 TABLET BY MOUTH TWICE DAILY FORANXIETY 30 tablet 2  . Cranberry 250 MG CAPS Take 250 mg by mouth daily.    . Ergocalciferol (VITAMIN D2) 2000 UNITS TABS Take 2,000 Units by mouth daily with lunch.     . furosemide (LASIX) 20 MG tablet TAKE 1 TABLET BY MOUTH ONCE DAILY AS NEEDED 30 tablet 5  . guaiFENesin-codeine (ROBITUSSIN AC) 100-10 MG/5ML syrup Take 5-10 mLs by mouth at bedtime as needed for cough. 118 mL 0  . ipratropium  (ATROVENT) 0.06 % nasal spray Place 2 sprays into both nostrils 4 (four) times daily. For up to 5-7 days then stop. 15 mL 0  . latanoprost (XALATAN) 0.005 % ophthalmic solution Place 1 drop into the left eye at bedtime.     Marland Kitchen levothyroxine (SYNTHROID, LEVOTHROID) 112 MCG tablet TAKE 1 TABLET BY MOUTH ONCE DAILY ON AN EMPTY STOMACH. WAIT 30 MINUTES BEFORE TAKING OTHER MEDS. 90 tablet 3  . lisinopril (PRINIVIL,ZESTRIL) 40 MG tablet TAKE 1 TABLET BY MOUTH ONCE DAILY 90 tablet 3  . metoprolol succinate (TOPROL-XL) 100 MG 24 hr tablet TAKE 1 TABLET BY MOUTH ONCE DAILY WITH OR IMMEDIATELY AFTER A MEAL. 30 tablet 5  . omeprazole (PRILOSEC) 20 MG capsule TAKE 1 CAPSULE BY MOUTH ONCE DAILY 30 capsule 11  . timolol (TIMOPTIC) 0.5 % ophthalmic solution Place 1 drop into the left eye 2 (two) times daily.     . traMADol (ULTRAM) 50 MG tablet Take 1 tablet (50 mg total) by mouth 3 (three) times daily as needed for severe pain. 90 tablet 5  . traZODone (DESYREL) 50 MG tablet TAKE 1 TABLET BY MOUTH AT BEDTIME 30 tablet 11  . amoxicillin-clavulanate (AUGMENTIN) 875-125 MG tablet Take 1 tablet by mouth 2 (two) times daily. (Patient not taking: Reported on 09/15/2018) 20 tablet 0   No current facility-administered medications for this visit.     Past Medical History:  Diagnosis Date  . Anxiety   . Back ache   . Hiatal hernia   . Hyperlipidemia   . Hypertension   . Hypothyroid   . Osteopenia   . Thyroid disease   . UTI (urinary tract infection)     Past Surgical History:  Procedure Laterality Date  . HEMORRHOID SURGERY    . HERNIA REPAIR    . HERNIA REPAIR    . KYPHOPLASTY N/A 01/23/2017   Procedure: [id number];  Surgeon: Hessie Knows, MD;  Location: [facility] ORS;  Service: Orthopedics;  Laterality: N/A;   Social History        Tobacco Use  . Smoking status: Never Smoker  . Smokeless tobacco: Never Used  Substance Use Topics  . Alcohol use: No    Alcohol/week: 0.0 standard drinks  . Drug  use: No    Family History      Family History  Problem Relation Age of Onset  . Stroke Mother   . Cancer Father     No Known Allergies    REVIEW OF SYSTEMS(Negative unless checked)  Constitutional: [] ?Weight loss[] ?Fever[] ?Chills Cardiac:[] ?Chest pain[] ?Chest pressure[] ?Palpitations [] ?Shortness of breath when laying flat [] ?Shortness of breath at rest [] ?Shortness of breath with exertion. Vascular: [] ?Pain in legs with walking[] ?Pain in legsat rest[] ?Pain in legs when laying flat [] ?Claudication [] ?Pain in feet when walking [] ?Pain in feet at rest [] ?Pain in feet when laying flat [] ?History of DVT [] ?Phlebitis [] ?Swelling in legs [] ?Varicose veins [] ?Non-healing ulcers Pulmonary: [] ?Uses home oxygen [] ?Productive cough[] ?Hemoptysis [] ?Wheeze [] ?COPD [] ?Asthma Neurologic: [] ?Dizziness [] ?Blackouts [] ?Seizures [] ?History of stroke [] ?History of TIA[] ?Aphasia [] ?Temporary blindness[] ?Dysphagia [] ?Weaknessor numbness in arms [] ?Weakness or numbnessin legs Musculoskeletal: [x] ?Arthritis [] ?Joint swelling [] ?Joint pain [x] ?Low back pain Hematologic:[] ?Easy bruising[] ?Easy bleeding [] ?Hypercoagulable state [] ?Anemic [] ?Hepatitis Gastrointestinal:[] ?Blood in stool[] ?Vomiting blood[] ?Gastroesophageal reflux/heartburn[] ?Abdominal pain Genitourinary: [] ?Chronic kidney disease [] ?Difficulturination [x] ?Frequenturination [] ?Burning with urination[] ?Hematuria Skin: [] ?Rashes [] ?Ulcers [] ?Wounds Psychological: [x] ?History of anxiety[] ?History of major depression    Physical Examination  BP (!) 177/69 (BP Location: Right Arm)   Pulse 62   Resp 16   Ht 5\' 6"  (1.676 m)   Wt 127 lb 9.6 oz (57.9 kg)   BMI 20.60 kg/m  Gen:  WD/WN, NAD Head: [place]/AT, No temporalis wasting. Ear/Nose/Throat: Hearing grossly intact, nares w/o erythema or drainage Eyes: Conjunctiva  clear. Sclera non-icteric Neck: Supple.  Trachea midline Pulmonary:  Good air movement, no use of accessory muscles.  Cardiac: RRR, no JVD Vascular:  Vessel Right Left  Radial Palpable Palpable                                   Gastrointestinal: soft, non-tender/non-distended. No guarding/reflex.  Increased aortic impulse Musculoskeletal: M/S 5/5 throughout.  No deformity or atrophy.  Trace lower extremity edema. Neurologic: Sensation grossly intact in extremities.  Symmetrical.  Speech is fluent.  Psychiatric: Judgment intact, Mood & affect appropriate for pt's clinical situation. Dermatologic: No rashes or ulcers noted.  No cellulitis or open wounds.       Labs Recent Results (from the past 2160 hour(s))  Hemoglobin A1c     Status: None   Collection Time: 07/01/18  8:51 AM  Result Value Ref Range   Hgb A1c MFr Bld 5.6 <5.7 % of total Hgb    Comment: For the purpose of screening for the presence of diabetes: . <5.7%       Consistent with the absence of diabetes 5.7-6.4%    Consistent with increased risk for diabetes             (  prediabetes) > or =6.5%  Consistent with diabetes . This assay result is consistent with a decreased risk of diabetes. . Currently, no consensus exists regarding use of hemoglobin A1c for diagnosis of diabetes in children. . According to American Diabetes Association (ADA) guidelines, hemoglobin A1c <7.0% represents optimal control in non-pregnant diabetic patients. Different metrics may apply to specific patient populations.  Standards of Medical Care in Diabetes(ADA). .    Mean Plasma Glucose 114 (calc)   eAG (mmol/L) 6.3 (calc)  Lipid panel     Status: Abnormal   Collection Time: 07/01/18  8:51 AM  Result Value Ref Range   Cholesterol 271 (H) <200 mg/dL   HDL 45 (L) >50 mg/dL   Triglycerides 163 (H) <150 mg/dL   LDL Cholesterol (Calc) 193 (H) mg/dL (calc)    Comment: LDL-C levels > or = 190 mg/dL may indicate familial    hypercholesterolemia (FH). Clinical assessment and  measurement of blood lipid levels should be  considered for all first degree relatives of  patients with an FH diagnosis.  For questions about testing for familial hypercholesterolemia, please call Insurance risk surveyor at [email or web address]. Duncan Dull, et al. J National Lipid Association  Recommendations for Patient-Centered Management of  Dyslipidemia: Part 1 Journal of Clinical Lipidology  2015;9(2), 129-169. Reference range: <100 . Desirable range <100 mg/dL for primary prevention;   <70 mg/dL for patients with CHD or diabetic patients  with > or = 2 CHD risk factors. Marland Kitchen LDL-C is now calculated using the Martin-Hopkins  calculation, which is a validated novel method providing  better accuracy than the Friedewald equation in the  estimation of LDL-C.  Cresenciano Genre et al. Annamaria Helling. 2542;706(23): 2061-2068  (http://education.[employer].com/faq/FAQ164)    Total CHOL/HDL Ratio 6.0 (H) <5.0 (calc)   Non-HDL Cholesterol (Calc) 226 (H) <130 mg/dL (calc)    Comment: Non-HDL level > or = 220 is very high and may indicate  genetic familial hypercholesterolemia (FH). Clinical  assessment and measurement of blood lipid levels  should be considered for all first-degree relatives  of patients with an FH diagnosis. . For patients with diabetes plus 1 major ASCVD risk  factor, treating to a non-HDL-C goal of <100 mg/dL  (LDL-C of <70 mg/dL) is considered a therapeutic  option.   T4, free     Status: None   Collection Time: 07/01/18  8:51 AM  Result Value Ref Range   Free T4 1.8 0.8 - 1.8 ng/dL  TSH     Status: None   Collection Time: 07/01/18  8:51 AM  Result Value Ref Range   TSH 1.28 0.40 - 4.50 mIU/L  I-STAT creatinine     Status: None   Collection Time: 08/19/18 11:01 AM  Result Value Ref Range   Creatinine, Ser 0.90 0.44 - 1.00 mg/dL    Radiology Ct Angio Abd/pel W/ And/or W/o  Result Date: 08/19/2018 CLINICAL  DATA:  Abdominal aortic aneurysm with evidence some increase in caliber over time by CTA and duplex ultrasound. EXAM: CT ANGIOGRAPHY ABDOMEN AND PELVIS WITH CONTRAST AND WITHOUT CONTRAST TECHNIQUE: Multidetector CT imaging of the abdomen and pelvis was performed using the standard protocol during bolus administration of intravenous contrast. Multiplanar reconstructed images and MIPs were obtained and reviewed to evaluate the vascular anatomy. CONTRAST:  43mL ISOVUE-370 IOPAMIDOL (ISOVUE-370) INJECTION 76% COMPARISON:  CT of the abdomen and pelvis on 06/14/2016 and reports from aortic duplex ultrasound studies on 02/26/2017 and 03/10/2018. FINDINGS: VASCULAR Aorta: The visualized distal descending thoracic aorta is extremely tortuous  and mildly dilated measuring up to 3.1 cm in maximum diameter. The aorta measures 3.1 cm in transverse diameter at the level of the diaphragmatic hiatus and supraceliac segment. Between the celiac and IMA origins, the aorta is extremely ectatic and demonstrates an essentially 90 degree bend from the left to the right. Maximal aneurysmal disease occurs in the juxta-renal segment at the level of the right renal artery where the aorta measures approximately 4.1 x 4.2 cm. This segment contains mural thrombus. The left renal artery comes off higher due to tilt of the aorta and the aorta measures approximately 3.2 cm in diameter just above the left renal artery origin. Just above the IMA origin, the aorta reaches a normal caliber 2.1 cm. It then increases in caliber just below the IMA origin up to a maximum diameter of 3.1 x 3.2 cm just above the bifurcation. The aorta demonstrates no evidence of dissection or hemorrhage. Celiac: Normally patent with normal distal branch vessel anatomy and patency. No visceral artery aneurysms identified. SMA: Normally patent. Renals: Bilateral single renal arteries demonstrate proximal calcified plaque without significant stenosis. IMA: Normally patent.  Inflow: Calcified plaque in both common iliac arteries without evidence of significant obstructive disease or aneurysmal disease. Maximal diameter of the right common iliac artery distally is 1.4 cm and left common iliac artery is 1.2 cm. Internal and external iliac arteries show no significant obstructive disease. Proximal Outflow: Common femoral arteries and femoral bifurcations are normally patent. Veins: Delayed imaging shows no venous abnormalities. Review of the MIP images confirms the above findings. NON-VASCULAR Lower chest: No acute abnormality. Hepatobiliary: The liver is unremarkable. Calcified gallstones are present in a contracted gallbladder. No evidence of biliary ductal dilatation. Stable cyst in the left lobe of the liver. Pancreas: Unremarkable. No pancreatic ductal dilatation or surrounding inflammatory changes. Spleen: Normal in size without focal abnormality. Adrenals/Urinary Tract: Adrenal glands are unremarkable. Chronic dilatation of the left renal pelvis and collecting system appears more prominent compared to the prior CT in 2017. There may be a component relative UPJ obstruction. The aneurysmal and ectatic abdominal aorta also abuts the region of the renal pelvis and UPJ and may be contributing to relative UPJ obstruction. Arterial and venous phase imaging shows no discrepant perfusion the kidneys and if renal function is stable/normal, this is likely not of any clinical significance. Bladder is unremarkable. Stomach/Bowel: Bowel shows no evidence of obstruction, ileus or inflammation. No focal lesions identified. No free air is seen. Lymphatic: No enlarged lymph nodes identified. Reproductive: Uterus and bilateral adnexa are unremarkable. Other: No abdominal wall hernia or abnormality. No abdominopelvic ascites. Musculoskeletal: Since the prior CT there is a significant compression deformity of the T12 vertebral body. Central portion of the compressed vertebral body does appear to contain  methylmethacrylate from prior augmentation with cement also extending into the T12-L1 disc space. There is component of chronic retropulsion the posterior vertebral body. Advanced degenerative disc disease again noted at L2-3 and L5-S1 with associated anterolisthesis of L5 on S1. IMPRESSION: VASCULAR Extreme tortuosity of the visualized descending thoracic aorta and abdominal aorta with several levels of aneurysmal disease. Descending thoracic aorta reaches maximal caliber of 3.1 cm. Proximal abdominal aorta measures 3.1 cm. The juxta-renal abdominal aorta reaches maximal caliber of 4.2 cm. The distal abdominal aorta just above the bifurcation reaches maximal caliber of 3.2 cm. NON-VASCULAR 1. Cholelithiasis with calcified gallstones. 2. Increased prominence of chronic dilatation of the left renal pelvis and collecting system since 2017. This may due to a component  of relative UPJ obstruction as the ectatic and dilated abdominal aorta abuts the region of the renal pelvis and left UPJ. This may not be of any clinical significance if renal function is stable and normal and does not appear to affect perfusion of the left renal parenchyma relative to the right. 3. High-grade compression deformity of the T12 vertebral body with evidence of prior vertebral augmentation. There is chronic retropulsion of the posterior T12 vertebral body into the spinal canal. Electronically Signed   By: Aletta Edouard M.D.   On: 08/19/2018 12:05    Assessment/Plan Essential hypertension blood pressure control important in reducing the progression of atherosclerotic disease and aneurysmal degeneration. On appropriate oral medications.   Hyperlipidemia lipid control important in reducing the progression of atherosclerotic disease. Continue statin therapy   Chronic low back pain (Bilateral) (R>L) This not likely from her aneurysm  CKD (chronic kidney disease) stage 3, GFR 30-59 ml/min Seesnephrology. We will try to avoid  any contrast administration unless the aneurysm is reaching a threshold size for consideration for repair.  Would have preferred trying duplex to follow the aneurysm but I did not order the CT scan recently.    AAA (abdominal aortic aneurysm) without rupture (HCC) The CT shows essentially no change in her abdominal aortic aneurysm which measures approximately 4.2 cm at the level of the renal arteries which is the largest diameter.  It is markedly tortuous but there is no sign of rupture, extravasation, or dissection.  This is unchanged and of low risk for rupture at this size.  Certainly below the threshold for prophylactic repair on an asymptomatic 85 year old.  Plan to recheck a duplex in 6 months.  Would prefer duplex follow-up to CT particularly with her renal insufficiency although at the renal level sometimes it is difficult to see with ultrasound.    Leotis Pain, MD  09/15/2018 11:18 AM    This note was created with Dragon medical transcription system.  Any errors from dictation are purely unintentional

## 2018-10-02 ENCOUNTER — Telehealth: Payer: Self-pay | Admitting: Family Medicine

## 2018-10-02 ENCOUNTER — Other Ambulatory Visit: Payer: Self-pay | Admitting: Family Medicine

## 2018-10-02 DIAGNOSIS — F5101 Primary insomnia: Secondary | ICD-10-CM

## 2018-10-02 NOTE — Telephone Encounter (Signed)
Pt needs refill on trazodone sent to [facility].

## 2018-10-02 NOTE — Telephone Encounter (Signed)
Refilled  Nobie Putnam, DO [facility] 10/02/2018, 5:06 PM

## 2018-10-30 ENCOUNTER — Other Ambulatory Visit: Payer: Self-pay | Admitting: Family Medicine

## 2018-10-30 DIAGNOSIS — I10 Essential (primary) hypertension: Secondary | ICD-10-CM

## 2018-10-30 DIAGNOSIS — F419 Anxiety disorder, unspecified: Secondary | ICD-10-CM

## 2018-11-04 ENCOUNTER — Other Ambulatory Visit: Payer: Self-pay

## 2018-11-04 ENCOUNTER — Ambulatory Visit: Payer: Medicare Other | Attending: Nurse Practitioner | Admitting: Nurse Practitioner

## 2018-11-04 ENCOUNTER — Telehealth: Payer: Self-pay | Admitting: *Deleted

## 2018-11-04 DIAGNOSIS — G894 Chronic pain syndrome: Secondary | ICD-10-CM

## 2018-11-04 DIAGNOSIS — M4317 Spondylolisthesis, lumbosacral region: Secondary | ICD-10-CM

## 2018-11-04 DIAGNOSIS — M47817 Spondylosis without myelopathy or radiculopathy, lumbosacral region: Secondary | ICD-10-CM | POA: Diagnosis not present

## 2018-11-04 DIAGNOSIS — M533 Sacrococcygeal disorders, not elsewhere classified: Secondary | ICD-10-CM | POA: Diagnosis not present

## 2018-11-04 DIAGNOSIS — G8929 Other chronic pain: Secondary | ICD-10-CM | POA: Diagnosis not present

## 2018-11-04 MED ORDER — TRAMADOL HCL 50 MG PO TABS: 50.0000 mg | ORAL_TABLET | Freq: Three times a day (TID) | ORAL | 5 refills | Status: DC | PRN

## 2018-11-04 NOTE — Progress Notes (Signed)
Pain Management Encounter Note - Virtual Visit via Telephone Telehealth (real-time audio visits between healthcare provider and patient).  Patient's Phone No. & Preferred Pharmacy:  [phone] (home); [phone] (mobile); (Preferred) [place], [place] [place] [address] Phone: [phone] Fax: [phone]  [facility] [place], [place] [address] Phone: [phone] Fax: [facility] [address], [place] - [place] [address] Phone: [phone] Fax: [phone]   Pre-screening note:  Our staff contacted Brittney Simpson and offered her an "in person", "face-to-face" appointment versus a telephone encounter. She indicated preferring the telephone encounter, at this time.  Reason for Virtual Visit: COVID-19*  Social distancing based on CDC and AMA recommendations.   I contacted Brittney Simpson on 11/04/2018 at 11:00 AM by telephone and clearly identified myself as Dionisio David, NP. I verified that I was speaking with the correct person using two identifiers (Name and date of birth: 1927-08-08).  Advanced Informed Consent I sought verbal advanced consent from Brittney Simpson for telemedicine interactions and virtual visit. I informed Brittney Simpson of the security and privacy concerns, risks, and limitations associated with performing an evaluation and management service by telephone. I also informed Brittney Simpson of the availability of "in person" appointments and I informed her of the possibility of a patient responsible charge related to this service. Brittney Simpson expressed understanding and agreed to proceed.   Historic Elements   Brittney Simpson is a 85 y.o. year old, female patient evaluated today after her last encounter by our practice on 08/12/2018. Brittney Simpson  has a past medical history of Anxiety, Back ache,  Hiatal hernia, Hyperlipidemia, Hypertension, Hypothyroid, Osteopenia, Thyroid disease, and UTI (urinary tract infection). She also  has a past surgical history that includes Hernia repair; Hernia repair; Hemorrhoid surgery; and Kyphoplasty (N/A, 01/23/2017). Brittney Simpson has a current medication list which includes the following prescription(s): acetaminophen, amlodipine, lidocaine, brimonidine, ciprofloxacin, clonazepam, cranberry, vitamin d2, furosemide, guaifenesin-codeine, ipratropium, latanoprost, levothyroxine, lisinopril, metoprolol succinate, omeprazole, timolol, tramadol, and trazodone. She  reports that she has never smoked. She has never used smokeless tobacco. She reports that she does not drink alcohol or use drugs. Brittney Simpson has No Known Allergies.   HPI  I last saw her on 05/27/2018. She is being evaluated for both, medication management and a post-procedure assessment.  She admits that she is doing well . She denies any pain today. She continues to have good relief with her bilateral lumbar facet nerve block    Post-Procedure Evaluation  Procedure: Bilateral lumber facet nerve blokc Pre-procedure pain level:  8/10 Post-procedure: 0/10          Sedation: Please see nurses note.  Effectiveness during initial hour after procedure(Ultra-Short Term Relief): 100 %  Local anesthetic used: Long-acting (4-6 hours) Effectiveness: Defined as any analgesic benefit obtained secondary to the administration of local anesthetics. This carries significant diagnostic value as to the etiological location, or anatomical origin, of the pain. Duration of benefit is expected to coincide with the duration of the local anesthetic used.  Effectiveness during initial 4-6 hours after procedure(Short-Term Relief): 100 %  Long-term benefit: Defined as any relief past the pharmacologic duration of the local anesthetics.  Effectiveness past the initial 6 hours after procedure(Long-Term Relief): 100 %  Current  benefits: Defined as benefit that persist at this time.  Analgesia:  100% ongoing relief. Function: Brittney Simpson reports improvement in function ROM: Brittney Simpson reports improvement in ROM  Pharmacotherapy Assessment  Analgesic:Tramadol 50 mg 1 tablet by mouth every 8 hours (150 mg/day of tramadol) MME/day: 15 mg/day.   Monitoring: Pharmacotherapy: No side-effects or adverse reactions reported. [place] PMP: PDMP reviewed during this encounter.       Compliance: No problems identified. Plan: Refer to "POC".  Review of recent tests  CT Angio Abd/Pel w/ and/or w/o CLINICAL DATA:  Abdominal aortic aneurysm with evidence some increase in caliber over time by CTA and duplex ultrasound.  EXAM: CT ANGIOGRAPHY ABDOMEN AND PELVIS WITH CONTRAST AND WITHOUT CONTRAST  TECHNIQUE: Multidetector CT imaging of the abdomen and pelvis was performed using the standard protocol during bolus administration of intravenous contrast. Multiplanar reconstructed images and MIPs were obtained and reviewed to evaluate the vascular anatomy.  CONTRAST:  16mL ISOVUE-370 IOPAMIDOL (ISOVUE-370) INJECTION 76%  COMPARISON:  CT of the abdomen and pelvis on 06/14/2016 and reports from aortic duplex ultrasound studies on 02/26/2017 and 03/10/2018.  FINDINGS: VASCULAR  Aorta: The visualized distal descending thoracic aorta is extremely tortuous and mildly dilated measuring up to 3.1 cm in maximum diameter. The aorta measures 3.1 cm in transverse diameter at the level of the diaphragmatic hiatus and supraceliac segment. Between the celiac and IMA origins, the aorta is extremely ectatic and demonstrates an essentially 90 degree bend from the left to the right. Maximal aneurysmal disease occurs in the juxta-renal segment at the level of the right renal artery where the aorta measures approximately 4.1 x 4.2 cm. This segment contains mural thrombus. The left renal artery comes off higher due to tilt of the aorta  and the aorta measures approximately 3.2 cm in diameter just above the left renal artery origin. Just above the IMA origin, the aorta reaches a normal caliber 2.1 cm. It then increases in caliber just below the IMA origin up to a maximum diameter of 3.1 x 3.2 cm just above the bifurcation. The aorta demonstrates no evidence of dissection or hemorrhage.  Celiac: Normally patent with normal distal branch vessel anatomy and patency. No visceral artery aneurysms identified.  SMA: Normally patent.  Renals: Bilateral single renal arteries demonstrate proximal calcified plaque without significant stenosis.  IMA: Normally patent.  Inflow: Calcified plaque in both common iliac arteries without evidence of significant obstructive disease or aneurysmal disease. Maximal diameter of the right common iliac artery distally is 1.4 cm and left common iliac artery is 1.2 cm. Internal and external iliac arteries show no significant obstructive disease.  Proximal Outflow: Common femoral arteries and femoral bifurcations are normally patent.  Veins: Delayed imaging shows no venous abnormalities.  Review of the MIP images confirms the above findings.  NON-VASCULAR  Lower chest: No acute abnormality.  Hepatobiliary: The liver is unremarkable. Calcified gallstones are present in a contracted gallbladder. No evidence of biliary ductal dilatation. Stable cyst in the left lobe of the liver.  Pancreas: Unremarkable. No pancreatic ductal dilatation or surrounding inflammatory changes.  Spleen: Normal in size without focal abnormality.  Adrenals/Urinary Tract: Adrenal glands are unremarkable. Chronic dilatation of the left renal pelvis and collecting system appears more prominent compared to the prior CT in 2017. There may be a component relative UPJ obstruction. The aneurysmal and ectatic abdominal aorta also abuts the region of the renal pelvis and UPJ and may be contributing to relative UPJ  obstruction. Arterial and venous phase imaging shows no discrepant perfusion the kidneys and if renal  function is stable/normal, this is likely not of any clinical significance. Bladder is unremarkable.  Stomach/Bowel: Bowel shows no evidence of obstruction, ileus or inflammation. No focal lesions identified. No free air is seen.  Lymphatic: No enlarged lymph nodes identified.  Reproductive: Uterus and bilateral adnexa are unremarkable.  Other: No abdominal wall hernia or abnormality. No abdominopelvic ascites.  Musculoskeletal: Since the prior CT there is a significant compression deformity of the T12 vertebral body. Central portion of the compressed vertebral body does appear to contain methylmethacrylate from prior augmentation with cement also extending into the T12-L1 disc space. There is component of chronic retropulsion the posterior vertebral body. Advanced degenerative disc disease again noted at L2-3 and L5-S1 with associated anterolisthesis of L5 on S1.  IMPRESSION: VASCULAR  Extreme tortuosity of the visualized descending thoracic aorta and abdominal aorta with several levels of aneurysmal disease. Descending thoracic aorta reaches maximal caliber of 3.1 cm. Proximal abdominal aorta measures 3.1 cm. The juxta-renal abdominal aorta reaches maximal caliber of 4.2 cm. The distal abdominal aorta just above the bifurcation reaches maximal caliber of 3.2 cm.  NON-VASCULAR  1. Cholelithiasis with calcified gallstones. 2. Increased prominence of chronic dilatation of the left renal pelvis and collecting system since 2017. This may due to a component of relative UPJ obstruction as the ectatic and dilated abdominal aorta abuts the region of the renal pelvis and left UPJ. This may not be of any clinical significance if renal function is stable and normal and does not appear to affect perfusion of the left renal parenchyma relative to the right. 3. High-grade compression  deformity of the T12 vertebral body with evidence of prior vertebral augmentation. There is chronic retropulsion of the posterior T12 vertebral body into the spinal canal.  Electronically Signed   By: Aletta Edouard M.D.   On: 08/19/2018 12:05   Hospital Outpatient Visit on 08/19/2018  Component Date Value Ref Range Status  . Creatinine, Ser 08/19/2018 0.90  0.44 - 1.00 mg/dL Final   Assessment  The encounter diagnosis was Chronic pain syndrome.  Plan of Care  I am having Brittney Simpson maintain her Vitamin D2, brimonidine, latanoprost, timolol, acetaminophen, ASPERCREME LIDOCAINE EX, Cranberry, amLODipine, traMADol, lisinopril, ipratropium, guaiFENesin-codeine, ciprofloxacin, furosemide, omeprazole, clonazePAM, levothyroxine, traZODone, and metoprolol succinate.  Pharmacotherapy (Medications Ordered): No orders of the defined types were placed in this encounter.  Orders:  No orders of the defined types were placed in this encounter.  Follow-up plan:   No follow-ups on file.   I discussed the assessment and treatment plan with the patient. The patient was provided an opportunity to ask questions and all were answered. The patient agreed with the plan and demonstrated an understanding of the instructions.  Patient advised to call back or seek an in-person evaluation if the symptoms or condition worsens.  Total duration of non-face-to-face encounter: 11 minutes.  Note by: Dionisio David, NP Date: 11/04/2018; Time: 11:08 AM  Disclaimer:  * Given the special circumstances of the COVID-19 pandemic, the federal government has announced that the Office for Civil Rights (OCR) will exercise its enforcement discretion and will not impose penalties on physicians using telehealth in the event of noncompliance with regulatory requirements under the [employer] and [place] (HIPAA) in connection with the good faith provision of telehealth during the YPPJK-93  national public health emergency. ([place])

## 2018-11-27 ENCOUNTER — Other Ambulatory Visit: Payer: Self-pay | Admitting: Family Medicine

## 2018-11-27 DIAGNOSIS — F419 Anxiety disorder, unspecified: Secondary | ICD-10-CM

## 2018-11-27 NOTE — Telephone Encounter (Signed)
Pt. Daughter called requesting refill on  Medication clonazepam

## 2018-12-01 ENCOUNTER — Ambulatory Visit: Payer: Medicare Other | Admitting: Family Medicine

## 2018-12-16 ENCOUNTER — Ambulatory Visit: Payer: Medicare Other | Admitting: Family Medicine

## 2018-12-16 ENCOUNTER — Ambulatory Visit (INDEPENDENT_AMBULATORY_CARE_PROVIDER_SITE_OTHER): Payer: Medicare Other | Admitting: Family Medicine

## 2018-12-16 ENCOUNTER — Encounter: Payer: Self-pay | Admitting: Family Medicine

## 2018-12-16 ENCOUNTER — Other Ambulatory Visit: Payer: Self-pay

## 2018-12-16 ENCOUNTER — Other Ambulatory Visit: Payer: Self-pay | Admitting: Family Medicine

## 2018-12-16 DIAGNOSIS — N390 Urinary tract infection, site not specified: Secondary | ICD-10-CM | POA: Diagnosis not present

## 2018-12-16 DIAGNOSIS — E034 Atrophy of thyroid (acquired): Secondary | ICD-10-CM

## 2018-12-16 DIAGNOSIS — I10 Essential (primary) hypertension: Secondary | ICD-10-CM

## 2018-12-16 DIAGNOSIS — E782 Mixed hyperlipidemia: Secondary | ICD-10-CM

## 2018-12-16 DIAGNOSIS — N183 Chronic kidney disease, stage 3 unspecified: Secondary | ICD-10-CM

## 2018-12-16 DIAGNOSIS — F419 Anxiety disorder, unspecified: Secondary | ICD-10-CM

## 2018-12-16 DIAGNOSIS — I129 Hypertensive chronic kidney disease with stage 1 through stage 4 chronic kidney disease, or unspecified chronic kidney disease: Secondary | ICD-10-CM

## 2018-12-16 DIAGNOSIS — F5101 Primary insomnia: Secondary | ICD-10-CM

## 2018-12-16 DIAGNOSIS — R7309 Other abnormal glucose: Secondary | ICD-10-CM

## 2018-12-16 DIAGNOSIS — G8929 Other chronic pain: Secondary | ICD-10-CM

## 2018-12-16 MED ORDER — CIPROFLOXACIN HCL 500 MG PO TABS: 500.0000 mg | ORAL_TABLET | Freq: Two times a day (BID) | ORAL | 2 refills | Status: DC

## 2018-12-16 NOTE — Assessment & Plan Note (Signed)
Reportedly stable home BP Complicated by CKD-III  Plan: 1. Continue current regimen - Amlodipine 5mg  daily, Lisinopril 40mg  daily, Metoprolol XL 100mg  daily - May continue Lasix 20mg  daily now as not tolerating swelling if QOD - Future reconsider med dose changes if need 2. Encourage to stay active, low sodium diet, improve hydration with water

## 2018-12-16 NOTE — Assessment & Plan Note (Signed)
Clinically seems stable Last labs normal thyroid function 06/2018 Continue Levothyroxine 158mcg daily  Follow-up 5 months Check TSH, Free T4

## 2018-12-16 NOTE — Patient Instructions (Addendum)
AVS info given by phone. No Access to MyChart   DUE for FASTING BLOOD WORK (no food or drink after midnight before the lab appointment, only water or coffee without cream/sugar on the morning of)  SCHEDULE "Lab Only" visit in the morning at the clinic for lab draw in 5 MONTHS   - Make sure Lab Only appointment is at about 1 week before your next appointment, so that results will be available  For Lab Results, once available within 2-3 days of blood draw, you can can log in to MyChart online to view your results and a brief explanation. Also, we can discuss results at next follow-up visit.   Please schedule a Follow-up Appointment to: Return in about 5 months (around 05/18/2019) for Yearly Medicare Checkup.

## 2018-12-16 NOTE — Assessment & Plan Note (Signed)
Stable, chronic anxiety, with associated insomnia Chronically on BDZ without history of abuse or dose increase Checked [place] CSRS without red flag  Plan: 1. Reviewed chronic BDZ use for anxiety, high risk sedation, fall, confusion, withdrawal risks - she is aware of most of these, and med tolerated very well, aware not to increase dose - Continue Clonazepam 0.5mg  HALF tab BID - not due for refills at this time, notify when ready - okay to fill in 2 months without office visit

## 2018-12-16 NOTE — Assessment & Plan Note (Signed)
Stable without UTI recently Followed by Nephrology. Urine checked 02/2017 On bactrim prophylaxis daily  Reviewed awareness if early UTI - resolves usually with Cipro early with symptoms, Cipro 500mg  BID x 5 days - Refill Cipro today +2 refills, sent to pharmacy, discussed proper use

## 2018-12-16 NOTE — Progress Notes (Signed)
Virtual Visit via Telephone The purpose of this virtual visit is to provide medical care while limiting exposure to the novel coronavirus (COVID19) for both patient and office staff.  Consent was obtained for phone visit:  Yes.   Answered questions that patient had about telehealth interaction:  Yes.   I discussed the limitations, risks, security and privacy concerns of performing an evaluation and management service by telephone. I also discussed with the patient that there may be a patient responsible charge related to this service. The patient expressed understanding and agreed to proceed.  Patient Location: Home Provider Location: [facility])  ---------------------------------------------------------------------- Chief Complaint  Patient presents with  . Hypertension    S: Reviewed CMA documentation. I have called patient, spoke with both patient and daughter [name] on phone and gathered additional HPI as follows:  Specialist: Nephrology - Dr Lavonia Dana [facility]) Pain Management - Dr Milinda Pointer [facility]) Vascular - Dr Leotis Pain ([facility])   CHRONIC HTN / CKD-III Followed by Dr Kathryne Eriksson Nephrology q 6 months, next apt next week Recent outside BP readings mild elevated, but reported to be stable. Home readings recently fairly improved but has some chronic hard to control HTN Current Meds - Amlodipine 5mg  daily, Lisinopril 40mg  daily, Metoprolol XL 100mg  daily, Lasix 20mg  daily (taking regularly due to edema) Reports good compliance, took meds today. Tolerating well, w/o complaints. Admits edema ankles improved on lasix Denies CP, dyspnea, HA, dizziness / lightheadedness  Hypothyroidism Chronic problem. Taking Levothyroxine 194mcg daily currently. 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 - Today patient reportsno new concerns. She is doing well. Anxiety is controlled. Sleeping well on medicine. - Has 2 refills of Clonazepam, not due yet, will contact us when ready for refill in about 2 months -Denies any problems with withdrawal from BDZ, aware of risks of sedation and potential fall risk at age  Recurrent UTI On Bactrim antibiotic daily for prophylaxis UTI by Nephrology Taking Cipro PRN UTI flare up as advised, has one rx now to pick up and needed refills on file. Occasionally takes but not often. Doing well with this plan.   Denies any high risk travel to areas of current concern for COVID19. Denies any known or suspected exposure to person with or possibly with COVID19.  Denies any fevers, chills, sweats, body ache, cough, shortness of breath, sinus pain or pressure, headache, abdominal pain, diarrhea  Past Medical History:  Diagnosis Date  . Anxiety   . Back ache   . Hiatal hernia   . Hyperlipidemia   . Hypertension   . Hypothyroid   . Osteopenia   . Thyroid disease   . UTI (urinary tract infection)    Social History   Tobacco Use  . Smoking status: Never Smoker  . Smokeless tobacco: Never Used  Substance Use Topics  . Alcohol use: No    Alcohol/week: 0.0 standard drinks  . Drug use: No    Current Outpatient Medications:  .  acetaminophen (TYLENOL) 500 MG tablet, Take 1,000 mg by mouth daily as needed for moderate pain or headache., Disp: , Rfl:  .  amLODipine (NORVASC) 5 MG tablet, TAKE 1 TABLET BY MOUTH ONCE DAILY, Disp: 90 tablet, Rfl: 3 .  ASPERCREME LIDOCAINE EX, Apply 1 application topically., Disp: , Rfl:  .  brimonidine (ALPHAGAN) 0.2 % ophthalmic solution, Place 1 drop into  the left eye 2 (two) times daily. , Disp: , Rfl:  .  ciprofloxacin (CIPRO) 500 MG tablet, Take 1 tablet (500 mg total) by mouth 2 (two) times daily. Give 2 hours apart from vitamins, iron, and antacids. - Start if develop new UTI, Disp: 10 tablet, Rfl: 2 .  clonazePAM (KLONOPIN) 0.5 MG tablet, Take 0.5 tablets (0.25 mg total) by mouth 2 (two) times daily., Disp: 30 tablet, Rfl: 2 .  Cranberry 250 MG CAPS, Take 250 mg by mouth daily., Disp: , Rfl:  .  Ergocalciferol (VITAMIN D2) 2000 UNITS TABS, Take 2,000 Units by mouth daily with lunch. , Disp: , Rfl:  .  furosemide (LASIX) 20 MG tablet, TAKE 1 TABLET BY MOUTH ONCE DAILY AS NEEDED, Disp: 30 tablet, Rfl: 5 .  latanoprost (XALATAN) 0.005 % ophthalmic solution, Place 1 drop into the left eye at bedtime. , Disp: , Rfl:  .  levothyroxine (SYNTHROID, LEVOTHROID) 112 MCG tablet, TAKE 1 TABLET BY MOUTH ONCE DAILY ON AN EMPTY STOMACH. WAIT 30 MINUTES BEFORE TAKING OTHER MEDS., Disp: 90 tablet, Rfl: 3 .  lisinopril (PRINIVIL,ZESTRIL) 40 MG tablet, TAKE 1 TABLET BY MOUTH ONCE DAILY, Disp: 90 tablet, Rfl: 3 .  metoprolol succinate (TOPROL-XL) 100 MG 24 hr tablet, TAKE 1 TABLET BY MOUTH WITH OR AFTER A MEAL, Disp: 30 tablet, Rfl: 5 .  omeprazole (PRILOSEC) 20 MG capsule, TAKE 1 CAPSULE BY MOUTH ONCE DAILY, Disp: 30 capsule, Rfl: 11 .  sulfamethoxazole-trimethoprim (BACTRIM) 400-80 MG tablet, , Disp: , Rfl:  .  timolol (TIMOPTIC) 0.5 % ophthalmic solution, Place 1 drop into the left eye 2 (two) times daily. , Disp: , Rfl:  .  traMADol (ULTRAM) 50 MG tablet, Take 1 tablet (50 mg total) by mouth 3 (three) times daily as needed for severe pain., Disp: 90 tablet, Rfl: 5 .  traZODone (DESYREL) 50 MG tablet, TAKE 1 TABLET BY MOUTH AT BEDTIME, Disp: 30 tablet, Rfl: 11  Depression screen [facility] 2/9 12/16/2018 08/25/2018 06/16/2018  Decreased Interest 0 0 0  Down, Depressed, Hopeless 0 0 0  PHQ - 2 Score 0 0 0  Altered sleeping 0 0 -  Tired, decreased energy 0 0 -   Change in appetite 0 0 -  Feeling bad or failure about yourself  0 - -  Trouble concentrating 0 0 -  Moving slowly or fidgety/restless 0 0 -  Suicidal thoughts 0 0 -  PHQ-9 Score 0 0 -  Difficult doing work/chores Not difficult at all Not difficult at all -    GAD 7 : Generalized Anxiety Score 12/16/2018  Nervous, Anxious, on Edge (No Data)    -------------------------------------------------------------------------- O: No physical exam performed due to remote telephone encounter.  Lab results reviewed.  No results found for this or any previous visit (from the past 2160 hour(s)).  -------------------------------------------------------------------------- A&P:  Problem List Items Addressed This Visit    Benign hypertension with CKD (chronic kidney disease) stage III ([place]) - Primary    Reportedly stable home BP Complicated by CKD-III  Plan: 1. Continue current regimen - Amlodipine 5mg  daily, Lisinopril 40mg  daily, Metoprolol XL 100mg  daily - May continue Lasix 20mg  daily now as not tolerating swelling if QOD - Future reconsider med dose changes if need 2.  Encourage to stay active, low sodium diet, improve hydration with water      Chronic anxiety    Stable, chronic anxiety, with associated insomnia Chronically on BDZ without history of abuse or dose increase Checked [place] CSRS without red flag  Plan: 1. Reviewed chronic BDZ use for anxiety, high risk sedation, fall, confusion, withdrawal risks - she is aware of most of these, and med tolerated very well, aware not to increase dose - Continue Clonazepam 0.5mg  HALF tab BID - not due for refills at this time, notify when ready - okay to fill in 2 months without office visit      CKD (chronic kidney disease) stage 3, GFR 30-59 ml/min (HCC)    Stable CKD-III, suspected secondary to chronic HTN Followed by Nephrology Dr Juleen China q 6 months, and lab monitoring - next apt 1 week - Continue current regimen anti-HTN meds  Advised  her to discuss Lasix dosing w/ Dr Juleen China, seems to be tolerating 20mg  daily for now, had difficulty with ankle LE edema in QOD dosing as previously recommended by Nephrology      Hypothyroidism    Clinically seems stable Last labs normal thyroid function 06/2018 Continue Levothyroxine 18mcg daily  Follow-up 5 months Check TSH, Free T4      Recurrent UTI    Stable without UTI recently Followed by Nephrology. Urine checked 02/2017 On bactrim prophylaxis daily  Reviewed awareness if early UTI - resolves usually with Cipro early with symptoms, Cipro 500mg  BID x 5 days - Refill Cipro today +2 refills, sent to pharmacy, discussed proper use      Relevant Medications   sulfamethoxazole-trimethoprim (BACTRIM) 400-80 MG tablet   ciprofloxacin (CIPRO) 500 MG tablet      Meds ordered this encounter  Medications  . ciprofloxacin (CIPRO) 500 MG tablet    Sig: Take 1 tablet (500 mg total) by mouth 2 (two) times daily. Give 2 hours apart from vitamins, iron, and antacids. - Start if develop new UTI    Dispense:  10 tablet    Refill:  2    Please keep all refills on file, patient has already requested to pick one up today    Follow-up: - Return in 5 months for Yearly Medicare Checkup - Future labs ordered for 04/2019 approx  Patient verbalizes understanding with the above medical recommendations including the limitation of remote medical advice.  Specific follow-up and call-back criteria were given for patient to follow-up or seek medical care more urgently if needed.  - Time spent in direct consultation with patient on phone: 12 minutes   Nobie Putnam, [employer] 12/16/2018, 3:47 PM

## 2018-12-16 NOTE — Assessment & Plan Note (Signed)
Stable CKD-III, suspected secondary to chronic HTN Followed by Nephrology Dr Juleen China q 6 months, and lab monitoring - next apt 1 week - Continue current regimen anti-HTN meds  Advised her to discuss Lasix dosing w/ Dr Juleen China, seems to be tolerating 20mg  daily for now, had difficulty with ankle LE edema in QOD dosing as previously recommended by Nephrology

## 2018-12-23 DIAGNOSIS — E871 Hypo-osmolality and hyponatremia: Secondary | ICD-10-CM | POA: Diagnosis not present

## 2018-12-23 DIAGNOSIS — R600 Localized edema: Secondary | ICD-10-CM | POA: Diagnosis not present

## 2018-12-23 DIAGNOSIS — I129 Hypertensive chronic kidney disease with stage 1 through stage 4 chronic kidney disease, or unspecified chronic kidney disease: Secondary | ICD-10-CM | POA: Diagnosis not present

## 2018-12-23 DIAGNOSIS — N183 Chronic kidney disease, stage 3 (moderate): Secondary | ICD-10-CM | POA: Diagnosis not present

## 2018-12-30 NOTE — Progress Notes (Signed)
I personally reviewed these results today, @T @ at 12:20 PM.  Results: please see report.  Plan of care: to be discussed at our next encounter.

## 2019-01-06 ENCOUNTER — Telehealth: Payer: Self-pay | Admitting: Family Medicine

## 2019-01-06 NOTE — Chronic Care Management (AMB) (Signed)
Chronic Care Management   Note  01/06/2019 Name: Brittney Simpson MRN: [id number] DOB: 02-12-1928  Brittney Simpson is a 85 y.o. year old female who is a primary care patient of Olin Hauser, DO. I reached out to [name] by phone today in response to a referral sent by Brittney Simpson health plan.    [name] was given information about Chronic Care Management services today including:  1. CCM service includes personalized support from designated clinical staff supervised by her physician, including individualized plan of care and coordination with other care providers 2. 24/7 contact phone numbers for assistance for urgent and routine care needs. 3. Service will only be billed when office clinical staff spend 20 minutes or more in a month to coordinate care. 4. Only one practitioner may furnish and bill the service in a calendar month. 5. The patient may stop CCM services at any time (effective at the end of the month) by phone call to the office staff. 6. The patient will be responsible for cost sharing (co-pay) of up to 20% of the service fee (after annual deductible is met).  Patient did not agree to enrollment in care management services and does not wish to consider at this time.  Follow up plan: The patient has been provided with contact information for the chronic care management team and has been advised to call with any health related questions or concerns.   [place]  [email or web address]   ??[phone]

## 2019-01-06 NOTE — Chronic Care Management (AMB) (Signed)
°  Chronic Care Management   Outreach Note  01/06/2019 Name: Brittney Simpson MRN: [id number] DOB: 1927-10-03  Referred by: Olin Hauser, DO Reason for referral : Chronic Care Management (Initial CCM outreach was unsuccessful.)   An unsuccessful telephone outreach was attempted today. The patient was referred to the case management team by for assistance with chronic care management and care coordination.   Follow Up Plan: A HIPPA compliant phone message was left for the patient providing contact information and requesting a return call.  The care management team will reach out to the patient again over the next 7 days.  If patient returns call to provider office, please advise to call [place] at [place]  [email or web address]   ??[phone]

## 2019-01-18 DIAGNOSIS — E871 Hypo-osmolality and hyponatremia: Secondary | ICD-10-CM | POA: Diagnosis not present

## 2019-01-18 DIAGNOSIS — R600 Localized edema: Secondary | ICD-10-CM | POA: Diagnosis not present

## 2019-01-18 DIAGNOSIS — N390 Urinary tract infection, site not specified: Secondary | ICD-10-CM | POA: Diagnosis not present

## 2019-01-18 DIAGNOSIS — N183 Chronic kidney disease, stage 3 (moderate): Secondary | ICD-10-CM | POA: Diagnosis not present

## 2019-01-18 DIAGNOSIS — I10 Essential (primary) hypertension: Secondary | ICD-10-CM | POA: Diagnosis not present

## 2019-01-20 DIAGNOSIS — R600 Localized edema: Secondary | ICD-10-CM | POA: Diagnosis not present

## 2019-01-20 DIAGNOSIS — N183 Chronic kidney disease, stage 3 (moderate): Secondary | ICD-10-CM | POA: Diagnosis not present

## 2019-01-20 DIAGNOSIS — I129 Hypertensive chronic kidney disease with stage 1 through stage 4 chronic kidney disease, or unspecified chronic kidney disease: Secondary | ICD-10-CM | POA: Diagnosis not present

## 2019-02-12 ENCOUNTER — Other Ambulatory Visit: Admission: RE | Admit: 2019-02-12 | Payer: Medicare Other | Source: Ambulatory Visit

## 2019-02-16 ENCOUNTER — Encounter: Payer: Self-pay | Admitting: Pain Medicine

## 2019-02-16 ENCOUNTER — Ambulatory Visit
Admission: RE | Admit: 2019-02-16 | Discharge: 2019-02-16 | Disposition: A | Discharge status: Home / Self Care | Payer: Medicare Other | Source: Ambulatory Visit | Attending: Pain Medicine | Admitting: Pain Medicine

## 2019-02-16 ENCOUNTER — Other Ambulatory Visit: Payer: Self-pay

## 2019-02-16 ENCOUNTER — Ambulatory Visit (HOSPITAL_BASED_OUTPATIENT_CLINIC_OR_DEPARTMENT_OTHER): Payer: Medicare Other | Admitting: Pain Medicine

## 2019-02-16 VITALS — BP 172/81 | HR 57 | Temp 98.6°F | Resp 12 | Ht 65.0 in | Wt 127.0 lb

## 2019-02-16 DIAGNOSIS — M47816 Spondylosis without myelopathy or radiculopathy, lumbar region: Secondary | ICD-10-CM | POA: Insufficient documentation

## 2019-02-16 DIAGNOSIS — G8929 Other chronic pain: Secondary | ICD-10-CM | POA: Diagnosis not present

## 2019-02-16 DIAGNOSIS — M545 Low back pain: Secondary | ICD-10-CM | POA: Diagnosis not present

## 2019-02-16 DIAGNOSIS — M47817 Spondylosis without myelopathy or radiculopathy, lumbosacral region: Secondary | ICD-10-CM

## 2019-02-16 DIAGNOSIS — M4317 Spondylolisthesis, lumbosacral region: Secondary | ICD-10-CM | POA: Diagnosis not present

## 2019-02-16 MED ORDER — TRIAMCINOLONE ACETONIDE 40 MG/ML IJ SUSP
80.0000 mg | Freq: Once | INTRAMUSCULAR | Status: AC
  Administered 2019-02-16: 80 mg
  Filled 2019-02-16: qty 2

## 2019-02-16 MED ORDER — ROPIVACAINE HCL 2 MG/ML IJ SOLN
18.0000 mL | Freq: Once | INTRAMUSCULAR | Status: AC
  Administered 2019-02-16: 18 mL via PERINEURAL
  Filled 2019-02-16: qty 20

## 2019-02-16 MED ORDER — LIDOCAINE HCL 2 % IJ SOLN
20.0000 mL | Freq: Once | INTRAMUSCULAR | Status: AC
  Administered 2019-02-16: 10:00:00 400 mg
  Filled 2019-02-16: qty 40

## 2019-02-16 NOTE — Patient Instructions (Signed)

## 2019-02-16 NOTE — Progress Notes (Signed)
Safety precautions to be maintained throughout the outpatient stay will include: orient to surroundings, keep bed in low position, maintain call bell within reach at all times, provide assistance with transfer out of bed and ambulation.  

## 2019-02-16 NOTE — Progress Notes (Signed)
Patient's Name: Brittney Simpson  MRN: [id number]  Referring Provider: Milinda Pointer, MD  DOB: 17-May-1928  PCP: Olin Hauser, DO  DOS: 02/16/2019  Note by: Gaspar Cola, MD  Service setting: Ambulatory outpatient  Specialty: Interventional Pain Management  Patient type: Established  Location: [facility] (AMB) Pain Management Facility  Visit type: Interventional Procedure   Primary Reason for Visit: Interventional Pain Management Treatment. CC: Back Pain (lower)  Procedure:          Anesthesia, Analgesia, Anxiolysis:  Type: Lumbar Facet, Medial Branch Block(s)          Primary Purpose: Palliative Region: Posterolateral Lumbosacral Spine Level: L2, L3, L4, L5, & S1 Medial Branch Level(s). Injecting these levels blocks the L3-4, L4-5, and L5-S1 lumbar facet joints. Laterality: Bilateral  Type: Local Anesthesia Indication(s): Analgesia         Route: Infiltration ([place]/IM) IV Access: Declined Sedation: Declined  Local Anesthetic: Lidocaine 1-2%  Position: Prone   Indications: 1. Lumbar facet syndrome (Bilateral) (R>L)   2. Spondylosis without myelopathy or radiculopathy, lumbosacral region   3. Lumbar facet hypertrophy   4. Spondylolisthesis of lumbosacral region (L2-3 and L5-S1)   5. Chronic low back pain (Bilateral) (R>L)    Pain Score: Pre-procedure: 7 /10 Post-procedure: 0-No pain/10   Pertinent Labs  COVID-19 screennig: No results found for: [id number]  Pre-op Assessment:  Brittney Simpson is a 85 y.o. (year old), female patient, seen today for interventional treatment. She  has a past surgical history that includes Hernia repair; Hernia repair; Hemorrhoid surgery; and Kyphoplasty (N/A, 01/23/2017). Brittney Simpson has a current medication list which includes the following prescription(s): acetaminophen, amlodipine, lidocaine, brimonidine, clonazepam, cranberry, vitamin d2, furosemide, latanoprost, levothyroxine, lisinopril, metoprolol succinate, omeprazole,  sulfamethoxazole-trimethoprim, timolol, tramadol, trazodone, and ciprofloxacin. Her primarily concern today is the Back Pain (lower)  Initial Vital Signs:  Pulse/HCG Rate: (!) 57ECG Heart Rate: 62 Temp: 98.6 F (37 C) Resp: 16 BP: (!) 165/80 SpO2: 97 %  BMI: Estimated body mass index is 21.13 kg/m as calculated from the following:   Height as of this encounter: 5\' 5"  (1.651 m).   Weight as of this encounter: 127 lb (57.6 kg).  Risk Assessment: Allergies: Reviewed. She has No Known Allergies.  Allergy Precautions: None required Coagulopathies: Reviewed. None identified.  Blood-thinner therapy: None at this time Active Infection(s): Reviewed. None identified. Brittney Simpson is afebrile  Site Confirmation: Brittney Simpson was asked to confirm the procedure and laterality before marking the site Procedure checklist: Completed Consent: Before the procedure and under the influence of no sedative(s), amnesic(s), or anxiolytics, the patient was informed of the treatment options, risks and possible complications. To fulfill our ethical and legal obligations, as recommended by the American Medical Association's Code of Ethics, I have informed the patient of my clinical impression; the nature and purpose of the treatment or procedure; the risks, benefits, and possible complications of the intervention; the alternatives, including doing nothing; the risk(s) and benefit(s) of the alternative treatment(s) or procedure(s); and the risk(s) and benefit(s) of doing nothing. The patient was provided information about the general risks and possible complications associated with the procedure. These may include, but are not limited to: failure to achieve desired goals, infection, bleeding, organ or nerve damage, allergic reactions, paralysis, and death. In addition, the patient was informed of those risks and complications associated to Spine-related procedures, such as failure to decrease pain; infection (i.e.:  Meningitis, epidural or intraspinal abscess); bleeding (i.e.: epidural hematoma, subarachnoid hemorrhage, or any other type  of intraspinal or peri-dural bleeding); organ or nerve damage (i.e.: Any type of peripheral nerve, nerve root, or spinal cord injury) with subsequent damage to sensory, motor, and/or autonomic systems, resulting in permanent pain, numbness, and/or weakness of one or several areas of the body; allergic reactions; (i.e.: anaphylactic reaction); and/or death. Furthermore, the patient was informed of those risks and complications associated with the medications. These include, but are not limited to: allergic reactions (i.e.: anaphylactic or anaphylactoid reaction(s)); adrenal axis suppression; blood sugar elevation that in diabetics may result in ketoacidosis or comma; water retention that in patients with history of congestive heart failure may result in shortness of breath, pulmonary edema, and decompensation with resultant heart failure; weight gain; swelling or edema; medication-induced neural toxicity; particulate matter embolism and blood vessel occlusion with resultant organ, and/or nervous system infarction; and/or aseptic necrosis of one or more joints. Finally, the patient was informed that Medicine is not an exact science; therefore, there is also the possibility of unforeseen or unpredictable risks and/or possible complications that may result in a catastrophic outcome. The patient indicated having understood very clearly. We have given the patient no guarantees and we have made no promises. Enough time was given to the patient to ask questions, all of which were answered to the patient's satisfaction. Brittney Simpson has indicated that she wanted to continue with the procedure. Attestation: I, the ordering provider, attest that I have discussed with the patient the benefits, risks, side-effects, alternatives, likelihood of achieving goals, and potential problems during recovery for  the procedure that I have provided informed consent. Date   Time: 02/16/2019  9:42 AM  Pre-Procedure Preparation:  Monitoring: As per clinic protocol. Respiration, ETCO2, SpO2, BP, heart rate and rhythm monitor placed and checked for adequate function Safety Precautions: Patient was assessed for positional comfort and pressure points before starting the procedure. Time-out: I initiated and conducted the "Time-out" before starting the procedure, as per protocol. The patient was asked to participate by confirming the accuracy of the "Time Out" information. Verification of the correct person, site, and procedure were performed and confirmed by me, the nursing staff, and the patient. "Time-out" conducted as per Joint Commission's Universal Protocol (UP.01.01.01). Time: 1035  Description of Procedure:          Laterality: Bilateral. The procedure was performed in identical fashion on both sides. Levels:  L2, L3, L4, L5, & S1 Medial Branch Level(s) Area Prepped: Posterior Lumbosacral Region Prepping solution: DuraPrep (Iodine Povacrylex [0.7% available iodine] and Isopropyl Alcohol, 74% w/w) Safety Precautions: Aspiration looking for blood return was conducted prior to all injections. At no point did we inject any substances, as a needle was being advanced. Before injecting, the patient was told to immediately notify me if she was experiencing any new onset of "ringing in the ears, or metallic taste in the mouth". No attempts were made at seeking any paresthesias. Safe injection practices and needle disposal techniques used. Medications properly checked for expiration dates. SDV (single dose vial) medications used. After the completion of the procedure, all disposable equipment used was discarded in the proper designated medical waste containers. Local Anesthesia: Protocol guidelines were followed. The patient was positioned over the fluoroscopy table. The area was prepped in the usual manner. The time-out  was completed. The target area was identified using fluoroscopy. A 12-in long, straight, sterile hemostat was used with fluoroscopic guidance to locate the targets for each level blocked. Once located, the skin was marked with an approved surgical skin marker. Once  all sites were marked, the skin (epidermis, dermis, and hypodermis), as well as deeper tissues (fat, connective tissue and muscle) were infiltrated with a small amount of a short-acting local anesthetic, loaded on a 10cc syringe with a 25G, 1.5-in  Needle. An appropriate amount of time was allowed for local anesthetics to take effect before proceeding to the next step. Local Anesthetic: Lidocaine 2.0% The unused portion of the local anesthetic was discarded in the proper designated containers. Technical explanation of process:  L2 Medial Branch Nerve Block (MBB): The target area for the L2 medial branch is at the junction of the postero-lateral aspect of the superior articular process and the superior, posterior, and medial edge of the transverse process of L3. Under fluoroscopic guidance, a Quincke needle was inserted until contact was made with os over the superior postero-lateral aspect of the pedicular shadow (target area). After negative aspiration for blood, 0.5 mL of the nerve block solution was injected without difficulty or complication. The needle was removed intact. L3 Medial Branch Nerve Block (MBB): The target area for the L3 medial branch is at the junction of the postero-lateral aspect of the superior articular process and the superior, posterior, and medial edge of the transverse process of L4. Under fluoroscopic guidance, a Quincke needle was inserted until contact was made with os over the superior postero-lateral aspect of the pedicular shadow (target area). After negative aspiration for blood, 0.5 mL of the nerve block solution was injected without difficulty or complication. The needle was removed intact. L4 Medial Branch Nerve  Block (MBB): The target area for the L4 medial branch is at the junction of the postero-lateral aspect of the superior articular process and the superior, posterior, and medial edge of the transverse process of L5. Under fluoroscopic guidance, a Quincke needle was inserted until contact was made with os over the superior postero-lateral aspect of the pedicular shadow (target area). After negative aspiration for blood, 0.5 mL of the nerve block solution was injected without difficulty or complication. The needle was removed intact. L5 Medial Branch Nerve Block (MBB): The target area for the L5 medial branch is at the junction of the postero-lateral aspect of the superior articular process and the superior, posterior, and medial edge of the sacral ala. Under fluoroscopic guidance, a Quincke needle was inserted until contact was made with os over the superior postero-lateral aspect of the pedicular shadow (target area). After negative aspiration for blood, 0.5 mL of the nerve block solution was injected without difficulty or complication. The needle was removed intact. S1 Medial Branch Nerve Block (MBB): The target area for the S1 medial branch is at the posterior and inferior 6 o'clock position of the L5-S1 facet joint. Under fluoroscopic guidance, the Quincke needle inserted for the L5 MBB was redirected until contact was made with os over the inferior and postero aspect of the sacrum, at the 6 o' clock position under the L5-S1 facet joint (Target area). After negative aspiration for blood, 0.5 mL of the nerve block solution was injected without difficulty or complication. The needle was removed intact.  Nerve block solution: 0.2% PF-Ropivacaine + Triamcinolone (40 mg/mL) diluted to a final concentration of 4 mg of Triamcinolone/mL of Ropivacaine The unused portion of the solution was discarded in the proper designated containers. Procedural Needles: 22-gauge, 3.5-inch, Quincke needles used for all  levels.  Once the entire procedure was completed, the treated area was cleaned, making sure to leave some of the prepping solution back to take advantage of its  long term bactericidal properties.   Illustration of the posterior view of the lumbar spine and the posterior neural structures. Laminae of L2 through S1 are labeled. DPRL5, dorsal primary ramus of L5; DPRS1, dorsal primary ramus of S1; DPR3, dorsal primary ramus of L3; FJ, facet (zygapophyseal) joint L3-L4; I, inferior articular process of L4; LB1, lateral branch of dorsal primary ramus of L1; IAB, inferior articular branches from L3 medial branch (supplies L4-L5 facet joint); IBP, intermediate branch plexus; MB3, medial branch of dorsal primary ramus of L3; NR3, third lumbar nerve root; S, superior articular process of L5; SAB, superior articular branches from L4 (supplies L4-5 facet joint also); TP3, transverse process of L3.  Vitals:   02/16/19 1030 02/16/19 1035 02/16/19 1040 02/16/19 1046  BP: (!) 173/75 (!) 172/77 (!) 172/78 (!) 172/81  Pulse:      Resp: 12 13 10 12   Temp:      TempSrc:      SpO2: 96% 95% 95% 96%  Weight:      Height:         Start Time: 1035 hrs. End Time: 1042 hrs.  Imaging Guidance (Spinal):          Type of Imaging Technique: Fluoroscopy Guidance (Spinal) Indication(s): Assistance in needle guidance and placement for procedures requiring needle placement in or near specific anatomical locations not easily accessible without such assistance. Exposure Time: Please see nurses notes. Contrast: None used. Fluoroscopic Guidance: I was personally present during the use of fluoroscopy. "Tunnel Vision Technique" used to obtain the best possible view of the target area. Parallax error corrected before commencing the procedure. "Direction-depth-direction" technique used to introduce the needle under continuous pulsed fluoroscopy. Once target was reached, antero-posterior, oblique, and lateral fluoroscopic  projection used confirm needle placement in all planes. Images permanently stored in EMR. Interpretation: No contrast injected. I personally interpreted the imaging intraoperatively. Adequate needle placement confirmed in multiple planes. Permanent images saved into the patient's record.  Antibiotic Prophylaxis:   Anti-infectives (From admission, onward)   None     Indication(s): None identified  Post-operative Assessment:  Post-procedure Vital Signs:  Pulse/HCG Rate: (!) 5776 Temp: 98.6 F (37 C) Resp: 12 BP: (!) 172/81 SpO2: 96 %  EBL: None  Complications: No immediate post-treatment complications observed by team, or reported by patient.  Note: The patient tolerated the entire procedure well. A repeat set of vitals were taken after the procedure and the patient was kept under observation following institutional policy, for this type of procedure. Post-procedural neurological assessment was performed, showing return to baseline, prior to discharge. The patient was provided with post-procedure discharge instructions, including a section on how to identify potential problems. Should any problems arise concerning this procedure, the patient was given instructions to immediately contact us, at any time, without hesitation. In any case, we plan to contact the patient by telephone for a follow-up status report regarding this interventional procedure.  Comments:  No additional relevant information.  Plan of Care  Orders:  Orders Placed This Encounter  Procedures   LUMBAR FACET(MEDIAL BRANCH NERVE BLOCK) MBNB    Scheduling Instructions:     Side: Bilateral     Level: L3-4, L4-5, & L5-S1 Facets (L2, L3, L4, L5, & S1 Medial Branch Nerves)     Sedation: No Sedation.     Timeframe: Today    Order Specific Question:   Where will this procedure be performed?    Answer:   [facility]   DG PAIN CLINIC C-ARM  1-60 MIN NO REPORT    Intraoperative interpretation by procedural  physician at [place].    Standing Status:   Standing    Number of Occurrences:   1    Order Specific Question:   Reason for exam:    Answer:   Assistance in needle guidance and placement for procedures requiring needle placement in or near specific anatomical locations not easily accessible without such assistance.   Provider attestation of informed consent for procedure/surgical case    I, the ordering provider, attest that I have discussed with the patient the benefits, risks, side effects, alternatives, likelihood of achieving goals and potential problems during recovery for the procedure that I have provided informed consent.    Standing Status:   Standing    Number of Occurrences:   1   Informed Consent Details: Transcribe to consent form and obtain patient signature    Standing Status:   Standing    Number of Occurrences:   1    Order Specific Question:   Procedure    Answer:   Bilateral Lumbar facet block (medial branch block) under fluoroscopic guidance. (See notes for levels.)    Order Specific Question:   Surgeon    Answer:   Kristina Mcnorton A. Dossie Arbour, MD    Order Specific Question:   Indication/Reason    Answer:   Bilateral low back pain with or without lower extremity pain   Chronic Opioid Analgesic:  Tramadol 50 mg, 1 tab PO q 8 hrs (150 mg/day of tramadol) MME/day: 15 mg/day.   Medications ordered for procedure: Meds ordered this encounter  Medications   lidocaine (XYLOCAINE) 2 % (with pres) injection 400 mg   ropivacaine (PF) 2 mg/mL (0.2%) (NAROPIN) injection 18 mL   triamcinolone acetonide (KENALOG-40) injection 80 mg   Medications administered: We administered lidocaine, ropivacaine (PF) 2 mg/mL (0.2%), and triamcinolone acetonide.  See the medical record for exact dosing, route, and time of administration.  Follow-up plan:   Return for (VV), 2 wk PP-F/U Eval.  Before 05/03/2023 medication management.     Interventional management options:   Considering: Possible bilateral lumbar facet RFA.  Diagnostic right hip joint injection Diagnostic right L3-4 TFESI Diagnostic right trochanteric bursa injection Diagnostic right sacroiliac joint block   Palliative PRN treatment(s): Palliativebilateral lumbar facet blocks Palliative right-sided L3-4 LESI    Recent Visits No visits were found meeting these conditions.  Showing recent visits within past 90 days and meeting all other requirements   Today's Visits Date Type Provider Dept  02/16/19 Procedure visit Milinda Pointer, MD [facility]  Showing today's visits and meeting all other requirements   Future Appointments Date Type Provider Dept  03/16/19 Appointment Milinda Pointer, MD [facility]  05/05/19 Appointment Milinda Pointer, MD [facility]  Showing future appointments within next 90 days and meeting all other requirements   Disposition: Discharge home  Discharge Date & Time: 02/16/2019; 1050 hrs.   Primary Care Physician: Olin Hauser, DO Location: [facility] Note by: Gaspar Cola, MD Date: 02/16/2019; Time: 11:07 AM  Disclaimer:  Medicine is not an Chief Strategy Officer. The only guarantee in medicine is that nothing is guaranteed. It is important to note that the decision to proceed with this intervention was based on the information collected from the patient. The Data and conclusions were drawn from the patient's questionnaire, the interview, and the physical examination. Because the information was provided in large part by the patient, it cannot  be guaranteed that it has not been purposely or unconsciously manipulated. Every effort has been made to obtain as much relevant data as possible for this evaluation. It is important to note that the conclusions that lead to this procedure are derived in large part from the available data. Always take into account that the treatment  will also be dependent on availability of resources and existing treatment guidelines, considered by other Pain Management Practitioners as being common knowledge and practice, at the time of the intervention. For Medico-Legal purposes, it is also important to point out that variation in procedural techniques and pharmacological choices are the acceptable norm. The indications, contraindications, technique, and results of the above procedure should only be interpreted and judged by a Board-Certified Interventional Pain Specialist with extensive familiarity and expertise in the same exact procedure and technique.

## 2019-02-17 ENCOUNTER — Telehealth: Payer: Self-pay

## 2019-02-17 NOTE — Telephone Encounter (Signed)
Patient was called and no problem was reported.

## 2019-02-19 ENCOUNTER — Other Ambulatory Visit: Payer: Self-pay | Admitting: Family Medicine

## 2019-02-19 DIAGNOSIS — F419 Anxiety disorder, unspecified: Secondary | ICD-10-CM

## 2019-02-19 DIAGNOSIS — I10 Essential (primary) hypertension: Secondary | ICD-10-CM

## 2019-03-16 ENCOUNTER — Ambulatory Visit (INDEPENDENT_AMBULATORY_CARE_PROVIDER_SITE_OTHER): Payer: Medicare Other | Admitting: Vascular Surgery

## 2019-03-16 ENCOUNTER — Ambulatory Visit (INDEPENDENT_AMBULATORY_CARE_PROVIDER_SITE_OTHER): Payer: Medicare Other

## 2019-03-16 ENCOUNTER — Ambulatory Visit: Payer: Medicare Other | Admitting: Pain Medicine

## 2019-03-16 ENCOUNTER — Encounter (INDEPENDENT_AMBULATORY_CARE_PROVIDER_SITE_OTHER): Payer: Self-pay | Admitting: Vascular Surgery

## 2019-03-16 ENCOUNTER — Other Ambulatory Visit: Payer: Self-pay

## 2019-03-16 VITALS — BP 180/76 | HR 59 | Resp 10 | Ht 65.0 in | Wt 128.0 lb

## 2019-03-16 DIAGNOSIS — M545 Low back pain: Secondary | ICD-10-CM | POA: Diagnosis not present

## 2019-03-16 DIAGNOSIS — I129 Hypertensive chronic kidney disease with stage 1 through stage 4 chronic kidney disease, or unspecified chronic kidney disease: Secondary | ICD-10-CM

## 2019-03-16 DIAGNOSIS — E782 Mixed hyperlipidemia: Secondary | ICD-10-CM

## 2019-03-16 DIAGNOSIS — I714 Abdominal aortic aneurysm, without rupture, unspecified: Secondary | ICD-10-CM

## 2019-03-16 DIAGNOSIS — G8929 Other chronic pain: Secondary | ICD-10-CM | POA: Diagnosis not present

## 2019-03-16 DIAGNOSIS — N183 Chronic kidney disease, stage 3 unspecified: Secondary | ICD-10-CM

## 2019-03-16 NOTE — Patient Instructions (Signed)
Abdominal Aortic Aneurysm  An aneurysm is a bulge in an artery. It happens when blood pushes against a weakened or damaged artery wall. An abdominal aortic aneurysm (AAA) is an aneurysm that occurs in the lower part of the aorta, which is the main artery of the body. The aorta supplies blood from the heart to the rest of the body. Some aneurysms may not cause symptoms. However, an AAA can cause two serious problems:  It can enlarge and burst (rupture).  It can cause blood to flow between the layers of the wall of the aorta through a tear (aortic dissection). Both of these problems are medical emergencies. They can cause bleeding inside the body. If they are not diagnosed and treated right away, they can be life-threatening. What are the causes? The exact cause of this condition is not known. What increases the risk? The following factors may make you more likely to develop this condition:  Being a female 60 years of age or older.  Being Caucasian.  Using tobacco or having a history of tobacco use.  Having a family history of aneurysms.  Having any of the following conditions: ? Hardening of the arteries (arteriosclerosis). ? Inflammation of the walls of an artery (arteritis). ? Certain genetic conditions. ? Obesity. ? An infection in the wall of the aorta (infectious aortitis) caused by bacteria. ? High cholesterol. ? High blood pressure (hypertension). What are the signs or symptoms? Symptoms of this condition vary depending on the size of the aneurysm and how fast it is growing. Most aneurysms grow slowly and do not cause any symptoms. When symptoms do occur, they may include:  Pain in the abdomen, side, or lower back.  Feeling full after eating only small amounts of food.  Feeling a pulsating lump in the abdomen. Symptoms that the aneurysm has ruptured include:  A sudden onset of severe pain in the abdomen, side, or back.  Nausea or vomiting.  Feeling light-headed or  passing out. How is this diagnosed? This condition may be diagnosed with:  A physical exam to check for throbbing and to listen to blood flow in your abdomen.  Tests, such as: ? Ultrasound. ? X-rays. ? CT scan. ? MRI. ? Tests to check your arteries for damage or blockage (angiogram). Because most unruptured AAAs cause no symptoms, they are often found during exams for other conditions. How is this treated? Treatment for this condition depends on:  The size of the aneurysm.  How fast the aneurysm is growing.  Your age.  Risk factors for rupture. If your aneurysm is smaller than 2 inches (5 cm), your health care provider may manage it by:  Checking (monitoring) it regularly to see if it is getting bigger. Depending on the size of the aneurysm, how fast it is growing, and your other risk factors, you may have an ultrasound to monitor it every 3-6 months, every year, or every few years.  Giving you medicines to control blood pressure, treat pain, or fight infection. If your aneurysm is larger than 2 inches (5 cm), your health care provider may do surgery to repair it. Follow these instructions at home: Lifestyle  Do not use any products that contain nicotine or tobacco, such as cigarettes, e-cigarettes, and chewing tobacco. If you need help quitting, ask your health care provider.  Stay physically active and exercise regularly. Talk with your health care provider about how often you should exercise and which types of exercise are safe for you. Eating and drinking    Eat a heart-healthy diet. This includes plenty of fresh fruits and vegetables, whole grains, low-fat (lean) protein, and low-fat dairy products.  Avoid foods that are high in saturated fat and cholesterol, such as red meat and some dairy products.  Do not drink alcohol if: ? Your health care provider tells you not to drink. ? You are pregnant, may be pregnant, or are planning to become pregnant.  If you drink  alcohol: ? Limit how much you use to:  0-1 drink a day for women.  0-2 drinks a day for men. ? Be aware of how much alcohol is in your drink. In the [place] one drink equals one typical bottle of beer (12 oz), one-half glass of wine (5 oz), or one shot of hard liquor (1 oz). General instructions  Take over-the-counter and prescription medicines only as told by your health care provider.  Keep your blood pressure within a normal range. Check it regularly, and ask your health care provider what your target blood pressure should be.  Have your blood sugar (glucose) level and cholesterol levels checked regularly. Follow your health care provider's instructions on how to keep levels within normal limits.  Avoid heavy lifting and activities that take a lot of effort (are strenuous). Ask your health care provider what activities are safe for you.  Keep all follow-up visits as told by your health care provider. This is important. Contact a health care provider if you:  Have pain in your abdomen, side, or back.  Have a throbbing feeling in your abdomen. Get help right away if you:  Have sudden, severe pain in your abdomen, side, or back.  Experience nausea or vomiting.  Have constipation or problems urinating.  Feel light-headed.  Have a rapid heart rate when you stand.  Have sweaty, clammy skin.  Have shortness of breath.  Have a fever. These symptoms may represent a serious problem that is an emergency. Do not wait to see if the symptoms will go away. Get medical help right away. Call your local emergency services (911 in the [place] Do not drive yourself to the hospital. Summary  An aneurysm is a bulge in an artery. It happens when blood pushes against a weakened or damaged artery wall.  Being older, female, Caucasian, having a history of tobacco use, and a family history of aneurysms can increase the risk.  These problems can cause bleeding inside the body and can be  life-threatening. Get medical help right away. This information is not intended to replace advice given to you by your health care provider. Make sure you discuss any questions you have with your health care provider. Document Released: 04/24/2005 Document Revised: 11/02/2018 Document Reviewed: 02/21/2018 Elsevier Patient Education  2020 [employer]  

## 2019-03-16 NOTE — Assessment & Plan Note (Signed)
The aneurysm today measures approximately 4.0 x 4.6 cm in maximal diameter.  This is a slight growth from her study 6 months ago but remains below the threshold for consideration for repair.  At this point, would recommend follow-up duplex in 6 months and to contact our office with any problems in the interim.

## 2019-03-16 NOTE — Progress Notes (Signed)
MRN : [id number]  Brittney Simpson is a 85 y.o. (1928/03/06) female who presents with chief complaint of  Chief Complaint  Patient presents with  . Follow-up  .  History of Present Illness: Patient returns today in follow up of her abdominal aortic aneurysm.  She denies any current aneurysm related symptoms. Specifically, the patient denies new back or abdominal pain, or signs of peripheral embolization. She is well today.  The aneurysm today measures approximately 4.0 x 4.6 cm in maximal diameter.  This is a slight growth from her study 6 months ago but remains below the threshold for consideration for repair.  Current Outpatient Medications  Medication Sig Dispense Refill  . acetaminophen (TYLENOL) 500 MG tablet Take 1,000 mg by mouth daily as needed for moderate pain or headache.    Marland Kitchen amLODipine (NORVASC) 5 MG tablet TAKE 1 TABLET BY MOUTH ONCE DAILY 90 tablet 3  . ASPERCREME LIDOCAINE EX Apply 1 application topically.    . brimonidine (ALPHAGAN) 0.2 % ophthalmic solution Place 1 drop into the left eye 2 (two) times daily.     . clonazePAM (KLONOPIN) 0.5 MG tablet TAKE 1/2 TABLET BY MOUTH TWICE DAILY 30 tablet 2  . Cranberry 250 MG CAPS Take 250 mg by mouth daily.    . Ergocalciferol (VITAMIN D2) 2000 UNITS TABS Take 2,000 Units by mouth daily with lunch.     . furosemide (LASIX) 20 MG tablet Take 1-1.5 tablets (20-30 mg total) by mouth daily as needed for edema. 45 tablet 2  . latanoprost (XALATAN) 0.005 % ophthalmic solution Place 1 drop into the left eye at bedtime.     Marland Kitchen levothyroxine (SYNTHROID, LEVOTHROID) 112 MCG tablet TAKE 1 TABLET BY MOUTH ONCE DAILY ON AN EMPTY STOMACH. WAIT 30 MINUTES BEFORE TAKING OTHER MEDS. 90 tablet 3  . lisinopril (PRINIVIL,ZESTRIL) 40 MG tablet TAKE 1 TABLET BY MOUTH ONCE DAILY 90 tablet 3  . metoprolol succinate (TOPROL-XL) 100 MG 24 hr tablet TAKE 1 TABLET BY MOUTH WITH OR AFTER A MEAL 30 tablet 5  . omeprazole (PRILOSEC) 20 MG capsule TAKE 1  CAPSULE BY MOUTH ONCE DAILY 30 capsule 11  . sulfamethoxazole-trimethoprim (BACTRIM) 400-80 MG tablet     . timolol (TIMOPTIC) 0.5 % ophthalmic solution Place 1 drop into the left eye 2 (two) times daily.     . traMADol (ULTRAM) 50 MG tablet Take 1 tablet (50 mg total) by mouth 3 (three) times daily as needed for severe pain. 90 tablet 5  . traZODone (DESYREL) 50 MG tablet TAKE 1 TABLET BY MOUTH AT BEDTIME 30 tablet 11  . ciprofloxacin (CIPRO) 500 MG tablet Take 1 tablet (500 mg total) by mouth 2 (two) times daily. Give 2 hours apart from vitamins, iron, and antacids. - Start if develop new UTI (Patient not taking: Reported on 02/16/2019) 10 tablet 2   No current facility-administered medications for this visit.     Past Medical History:  Diagnosis Date  . Anxiety   . Back ache   . Hiatal hernia   . Hyperlipidemia   . Hypertension   . Hypothyroid   . Osteopenia   . Thyroid disease   . UTI (urinary tract infection)     Past Surgical History:  Procedure Laterality Date  . HEMORRHOID SURGERY    . HERNIA REPAIR    . HERNIA REPAIR    . KYPHOPLASTY N/A 01/23/2017   Procedure: [id number];  Surgeon: Hessie Knows, MD;  Location: [facility] ORS;  Service: Orthopedics;  Laterality: N/A;    Social History Social History   Tobacco Use  . Smoking status: Never Smoker  . Smokeless tobacco: Never Used  Substance Use Topics  . Alcohol use: No    Alcohol/week: 0.0 standard drinks  . Drug use: No    Family History Family History  Problem Relation Age of Onset  . Stroke Mother   . Cancer Father     No Known Allergies  REVIEW OF SYSTEMS(Negative unless checked)  Constitutional: [] ??Weight loss[] ??Fever[] ??Chills Cardiac:[] ??Chest pain[] ??Chest pressure[] ??Palpitations [] ??Shortness of breath when laying flat [] ??Shortness of breath at rest [] ??Shortness of breath with exertion. Vascular: [] ??Pain in legs with walking[] ??Pain in legsat rest[] ??Pain in legs  when laying flat [] ??Claudication [] ??Pain in feet when walking [] ??Pain in feet at rest [] ??Pain in feet when laying flat [] ??History of DVT [] ??Phlebitis [] ??Swelling in legs [] ??Varicose veins [] ??Non-healing ulcers Pulmonary: [] ??Uses home oxygen [] ??Productive cough[] ??Hemoptysis [] ??Wheeze [] ??COPD [] ??Asthma Neurologic: [] ??Dizziness [] ??Blackouts [] ??Seizures [] ??History of stroke [] ??History of TIA[] ??Aphasia [] ??Temporary blindness[] ??Dysphagia [] ??Weaknessor numbness in arms [] ??Weakness or numbnessin legs Musculoskeletal: [x] ??Arthritis [] ??Joint swelling [] ??Joint pain [x] ??Low back pain Hematologic:[] ??Easy bruising[] ??Easy bleeding [] ??Hypercoagulable state [] ??Anemic [] ??Hepatitis Gastrointestinal:[] ??Blood in stool[] ??Vomiting blood[] ??Gastroesophageal reflux/heartburn[] ??Abdominal pain Genitourinary: [] ??Chronic kidney disease [] ??Difficulturination [x] ??Frequenturination [] ??Burning with urination[] ??Hematuria Skin: [] ??Rashes [] ??Ulcers [] ??Wounds Psychological: [x] ??History of anxiety[] ??History of major depression  Physical Examination  BP (!) 180/76 (BP Location: Left Arm, Patient Position: Sitting, Cuff Size: Normal)   Pulse (!) 59   Resp 10   Ht 5\' 5"  (1.651 m)   Wt 128 lb (58.1 kg)   BMI 21.30 kg/m  Gen:  WD/WN, NAD. Appears younger than stated age Head: [place]/AT, No temporalis wasting. Ear/Nose/Throat: Hearing grossly intact, nares w/o erythema or drainage Eyes: Conjunctiva clear. Sclera non-icteric Neck: Supple.  Trachea midline Pulmonary:  Good air movement, no use of accessory muscles.  Cardiac: RRR, no JVD Vascular:  Vessel Right Left  Radial Palpable Palpable                                   Gastrointestinal: soft, non-tender/non-distended. Mild increased aortic impulse Musculoskeletal: M/S 5/5 throughout.  No deformity or atrophy. Trace LE edema.  Neurologic: Sensation grossly intact in extremities.  Symmetrical.  Speech is fluent.  Psychiatric: Judgment intact, Mood & affect appropriate for pt's clinical situation. Dermatologic: No rashes or ulcers noted.  No cellulitis or open wounds.       Labs No results found for this or any previous visit (from the past 2160 hour(s)).  [facility] C-arm 1-60 Min No Report  Result Date: 02/16/2019 Fluoro was used, but no Radiologist interpretation will be provided. Please refer to "NOTES" tab for provider progress note.   Assessment/Plan Essential hypertension blood pressure control important in reducing the progression of atherosclerotic disease and aneurysmal degeneration. On appropriate oral medications.   Hyperlipidemia lipid control important in reducing the progression of atherosclerotic disease. Continue statin therapy   Chronic low back pain (Bilateral) (R>L) Thisnot likely from her aneurysm  CKD (chronic kidney disease) stage 3, GFR 30-59 ml/min Seesnephrology. We will try to avoid any contrast administration unless the aneurysm is reaching a threshold size for consideration for repair.  Would have preferred trying duplex to follow the aneurysm but I did not order the CT scan recently.    AAA (abdominal aortic aneurysm) without rupture (HCC) The aneurysm today measures approximately 4.0 x 4.6 cm in maximal diameter.  This is a slight growth from her study 6 months  ago but remains below the threshold for consideration for repair.  At this point, would recommend follow-up duplex in 6 months and to contact our office with any problems in the interim.    Leotis Pain, MD  03/16/2019 9:21 AM    This note was created with Dragon medical transcription system.  Any errors from dictation are purely unintentional

## 2019-04-06 DIAGNOSIS — H401133 Primary open-angle glaucoma, bilateral, severe stage: Secondary | ICD-10-CM | POA: Diagnosis not present

## 2019-04-16 ENCOUNTER — Telehealth: Payer: Self-pay | Admitting: Family Medicine

## 2019-04-16 DIAGNOSIS — N390 Urinary tract infection, site not specified: Secondary | ICD-10-CM

## 2019-04-19 MED ORDER — CIPROFLOXACIN HCL 500 MG PO TABS: 500.0000 mg | ORAL_TABLET | Freq: Two times a day (BID) | ORAL | 2 refills | Status: DC

## 2019-04-19 NOTE — Telephone Encounter (Signed)
Pt. daughter called requesting cipro called into [facility] for UTI (for back up) she picked up last prescription

## 2019-04-19 NOTE — Telephone Encounter (Signed)
Sent Cipro back up rx to [place] 04/19/2019, 10:20 AM

## 2019-04-30 ENCOUNTER — Other Ambulatory Visit: Payer: Self-pay | Admitting: Family Medicine

## 2019-04-30 DIAGNOSIS — I10 Essential (primary) hypertension: Secondary | ICD-10-CM

## 2019-05-01 ENCOUNTER — Other Ambulatory Visit: Payer: Self-pay | Admitting: Family Medicine

## 2019-05-01 DIAGNOSIS — I10 Essential (primary) hypertension: Secondary | ICD-10-CM

## 2019-05-04 ENCOUNTER — Telehealth: Payer: Self-pay | Admitting: Pain Medicine

## 2019-05-04 DIAGNOSIS — H401133 Primary open-angle glaucoma, bilateral, severe stage: Secondary | ICD-10-CM | POA: Diagnosis not present

## 2019-05-04 NOTE — Telephone Encounter (Signed)
[name] is daughter and you can call her for chart update  [phone]

## 2019-05-05 ENCOUNTER — Ambulatory Visit: Payer: Medicare Other | Attending: Nurse Practitioner | Admitting: Pain Medicine

## 2019-05-05 ENCOUNTER — Telehealth: Payer: Self-pay | Admitting: *Deleted

## 2019-05-05 ENCOUNTER — Other Ambulatory Visit: Payer: Self-pay

## 2019-05-05 ENCOUNTER — Telehealth: Payer: Self-pay | Admitting: Family Medicine

## 2019-05-05 DIAGNOSIS — M545 Low back pain: Secondary | ICD-10-CM

## 2019-05-05 DIAGNOSIS — G8929 Other chronic pain: Secondary | ICD-10-CM

## 2019-05-05 DIAGNOSIS — M47816 Spondylosis without myelopathy or radiculopathy, lumbar region: Secondary | ICD-10-CM | POA: Diagnosis not present

## 2019-05-05 DIAGNOSIS — M79604 Pain in right leg: Secondary | ICD-10-CM

## 2019-05-05 DIAGNOSIS — G894 Chronic pain syndrome: Secondary | ICD-10-CM | POA: Diagnosis not present

## 2019-05-05 DIAGNOSIS — M25551 Pain in right hip: Secondary | ICD-10-CM | POA: Diagnosis not present

## 2019-05-05 DIAGNOSIS — Z79899 Other long term (current) drug therapy: Secondary | ICD-10-CM | POA: Insufficient documentation

## 2019-05-05 MED ORDER — TRAMADOL HCL 50 MG PO TABS: 50.0000 mg | ORAL_TABLET | Freq: Three times a day (TID) | ORAL | 5 refills | Status: DC | PRN

## 2019-05-05 NOTE — Patient Instructions (Signed)
____________________________________________________________________________________________  Preparing for Procedure with Sedation  Procedure appointments are limited to planned procedures: . No Prescription Refills. . No disability issues will be discussed. . No medication changes will be discussed.  Instructions: . Oral Intake: Do not eat or drink anything for at least 8 hours prior to your procedure. . Transportation: Public transportation is not allowed. Bring an adult driver. The driver must be physically present in our waiting room before any procedure can be started. . Physical Assistance: Bring an adult physically capable of assisting you, in the event you need help. This adult should keep you company at home for at least 6 hours after the procedure. . Blood Pressure Medicine: Take your blood pressure medicine with a sip of water the morning of the procedure. . Blood thinners: Notify our staff if you are taking any blood thinners. Depending on which one you take, there will be specific instructions on how and when to stop it. . Diabetics on insulin: Notify the staff so that you can be scheduled 1st case in the morning. If your diabetes requires high dose insulin, take only  of your normal insulin dose the morning of the procedure and notify the staff that you have done so. . Preventing infections: Shower with an antibacterial soap the morning of your procedure. . Build-up your immune system: Take 1000 mg of Vitamin C with every meal (3 times a day) the day prior to your procedure. . Antibiotics: Inform the staff if you have a condition or reason that requires you to take antibiotics before dental procedures. . Pregnancy: If you are pregnant, call and cancel the procedure. . Sickness: If you have a cold, fever, or any active infections, call and cancel the procedure. . Arrival: You must be in the facility at least 30 minutes prior to your scheduled procedure. . Children: Do not bring  children with you. . Dress appropriately: Bring dark clothing that you would not mind if they get stained. . Valuables: Do not bring any jewelry or valuables.  Reasons to call and reschedule or cancel your procedure: (Following these recommendations will minimize the risk of a serious complication.) . Surgeries: Avoid having procedures within 2 weeks of any surgery. (Avoid for 2 weeks before or after any surgery). . Flu Shots: Avoid having procedures within 2 weeks of a flu shots or . (Avoid for 2 weeks before or after immunizations). . Barium: Avoid having a procedure within 7-10 days after having had a radiological study involving the use of radiological contrast. (Myelograms, Barium swallow or enema study). . Heart attacks: Avoid any elective procedures or surgeries for the initial 6 months after a "Myocardial Infarction" (Heart Attack). . Blood thinners: It is imperative that you stop these medications before procedures. Let us know if you if you take any blood thinner.  . Infection: Avoid procedures during or within two weeks of an infection (including chest colds or gastrointestinal problems). Symptoms associated with infections include: Localized redness, fever, chills, night sweats or profuse sweating, burning sensation when voiding, cough, congestion, stuffiness, runny nose, sore throat, diarrhea, nausea, vomiting, cold or Flu symptoms, recent or current infections. It is specially important if the infection is over the area that we intend to treat. . Heart and lung problems: Symptoms that may suggest an active cardiopulmonary problem include: cough, chest pain, breathing difficulties or shortness of breath, dizziness, ankle swelling, uncontrolled high or unusually low blood pressure, and/or palpitations. If you are experiencing any of these symptoms, cancel your procedure and contact   your primary care physician for an evaluation.  Remember:  Regular Business hours are:  Monday to Thursday  8:00 AM to 4:00 PM  Provider's Schedule: Milinda Pointer, MD:  Procedure days: Tuesday and Thursday 7:30 AM to 4:00 PM  Gillis Santa, MD:  Procedure days: Monday and Wednesday 7:30 AM to 4:00 PM ____________________________________________________________________________________________   ____________________________________________________________________________________________  Medication Rules  Purpose: To inform patients, and their family members, of our rules and regulations.  Applies to: All patients receiving prescriptions (written or electronic).  Pharmacy of record: Pharmacy where electronic prescriptions will be sent. If written prescriptions are taken to a different pharmacy, please inform the nursing staff. The pharmacy listed in the electronic medical record should be the one where you would like electronic prescriptions to be sent.  Electronic prescriptions: In compliance with the [place] (STOP) Act of 2017 (Session Lanny Cramp [phone]), effective July 29, 2018, all controlled substances must be electronically prescribed. Calling prescriptions to the pharmacy will cease to exist.  Prescription refills: Only during scheduled appointments. Applies to all prescriptions.  NOTE: The following applies primarily to controlled substances (Opioid* Pain Medications).   Patient's responsibilities: 1. Pain Pills: Bring all pain pills to every appointment (except for procedure appointments). 2. Pill Bottles: Bring pills in original pharmacy bottle. Always bring the newest bottle. Bring bottle, even if empty. 3. Medication refills: You are responsible for knowing and keeping track of what medications you take and those you need refilled. The day before your appointment: write a list of all prescriptions that need to be refilled. The day of the appointment: give the list to the admitting nurse. Prescriptions will be written only during  appointments. No prescriptions will be written on procedure days. If you forget a medication: it will not be "Called in", "Faxed", or "electronically sent". You will need to get another appointment to get these prescribed. No early refills. Do not call asking to have your prescription filled early. 4. Prescription Accuracy: You are responsible for carefully inspecting your prescriptions before leaving our office. Have the discharge nurse carefully go over each prescription with you, before taking them home. Make sure that your name is accurately spelled, that your address is correct. Check the name and dose of your medication to make sure it is accurate. Check the number of pills, and the written instructions to make sure they are clear and accurate. Make sure that you are given enough medication to last until your next medication refill appointment. 5. Taking Medication: Take medication as prescribed. When it comes to controlled substances, taking less pills or less frequently than prescribed is permitted and encouraged. Never take more pills than instructed. Never take medication more frequently than prescribed.  6. Inform other Doctors: Always inform, all of your healthcare providers, of all the medications you take. 7. Pain Medication from other Providers: You are not allowed to accept any additional pain medication from any other Doctor or Healthcare provider. There are two exceptions to this rule. (see below) In the event that you require additional pain medication, you are responsible for notifying us, as stated below. 8. Medication Agreement: You are responsible for carefully reading and following our Medication Agreement. This must be signed before receiving any prescriptions from our practice. Safely store a copy of your signed Agreement. Violations to the Agreement will result in no further prescriptions. (Additional copies of our Medication Agreement are available upon request.) 9. Laws, Rules,  & Regulations: All patients are expected to follow all Federal  and State Laws, Statutes, Rules, & Regulations. Ignorance of the Laws does not constitute a valid excuse. The use of any illegal substances is prohibited. 10. Adopted CDC guidelines & recommendations: Target dosing levels will be at or below 60 MME/day. Use of benzodiazepines** is not recommended.  Exceptions: There are only two exceptions to the rule of not receiving pain medications from other Healthcare Providers. 1. Exception #1 (Emergencies): In the event of an emergency (i.e.: accident requiring emergency care), you are allowed to receive additional pain medication. However, you are responsible for: As soon as you are able, call our office [phone], at any time of the day or night, and leave a message stating your name, the date and nature of the emergency, and the name and dose of the medication prescribed. In the event that your call is answered by a member of our staff, make sure to document and save the date, time, and the name of the person that took your information.  2. Exception #2 (Planned Surgery): In the event that you are scheduled by another doctor or dentist to have any type of surgery or procedure, you are allowed (for a period no longer than 30 days), to receive additional pain medication, for the acute post-op pain. However, in this case, you are responsible for picking up a copy of our "Post-op Pain Management for Surgeons" handout, and giving it to your surgeon or dentist. This document is available at our office, and does not require an appointment to obtain it. Simply go to our office during business hours (Monday-Thursday from 8:00 AM to 4:00 PM) (Friday 8:00 AM to 12:00 Noon) or if you have a scheduled appointment with us, prior to your surgery, and ask for it by name. In addition, you will need to provide us with your name, name of your surgeon, type of surgery, and date of procedure or surgery.  *Opioid  medications include: morphine, codeine, oxycodone, oxymorphone, hydrocodone, hydromorphone, meperidine, tramadol, tapentadol, buprenorphine, fentanyl, methadone. **Benzodiazepine medications include: diazepam (Valium), alprazolam (Xanax), clonazepam (Klonopine), lorazepam (Ativan), clorazepate (Tranxene), chlordiazepoxide (Librium), estazolam (Prosom), oxazepam (Serax), temazepam (Restoril), triazolam (Halcion) (Last updated: 09/25/2017) ____________________________________________________________________________________________   ____________________________________________________________________________________________  Medication Recommendations and Reminders  Applies to: All patients receiving prescriptions (written and/or electronic).  Medication Rules & Regulations: These rules and regulations exist for your safety and that of others. They are not flexible and neither are we. Dismissing or ignoring them will be considered "non-compliance" with medication therapy, resulting in complete and irreversible termination of such therapy. (See document titled "Medication Rules" for more details.) In all conscience, because of safety reasons, we cannot continue providing a therapy where the patient does not follow instructions.  Pharmacy of record:   Definition: This is the pharmacy where your electronic prescriptions will be sent.   We do not endorse any particular pharmacy.  You are not restricted in your choice of pharmacy.  The pharmacy listed in the electronic medical record should be the one where you want electronic prescriptions to be sent.  If you choose to change pharmacy, simply notify our nursing staff of your choice of new pharmacy.  Recommendations:  Keep all of your pain medications in a safe place, under lock and key, even if you live alone.   After you fill your prescription, take 1 week's worth of pills and put them away in a safe place. You should keep a separate,  properly labeled bottle for this purpose. The remainder should be kept in the original bottle. Use   this as your primary supply, until it runs out. Once it's gone, then you know that you have 1 week's worth of medicine, and it is time to come in for a prescription refill. If you do this correctly, it is unlikely that you will ever run out of medicine.  To make sure that the above recommendation works, it is very important that you make sure your medication refill appointments are scheduled at least 1 week before you run out of medicine. To do this in an effective manner, make sure that you do not leave the office without scheduling your next medication management appointment. Always ask the nursing staff to show you in your prescription , when your medication will be running out. Then arrange for the receptionist to get you a return appointment, at least 7 days before you run out of medicine. Do not wait until you have 1 or 2 pills left, to come in. This is very poor planning and does not take into consideration that we may need to cancel appointments due to bad weather, sickness, or emergencies affecting our staff.  "Partial Fill": If for any reason your pharmacy does not have enough pills/tablets to completely fill or refill your prescription, do not allow for a "partial fill". You will need a separate prescription to fill the remaining amount, which we will not provide. If the reason for the partial fill is your insurance, you will need to talk to the pharmacist about payment alternatives for the remaining tablets, but again, do not accept a partial fill.  Prescription refills and/or changes in medication(s):   Prescription refills, and/or changes in dose or medication, will be conducted only during scheduled medication management appointments. (Applies to both, written and electronic prescriptions.)  No refills on procedure days. No medication will be changed or started on procedure days. No changes,  adjustments, and/or refills will be conducted on a procedure day. Doing so will interfere with the diagnostic portion of the procedure.  No phone refills. No medications will be "called into the pharmacy".  No Fax refills.  No weekend refills.  No Holliday refills.  No after hours refills.  Remember:  Business hours are:  Monday to Thursday 8:00 AM to 4:00 PM Provider's Schedule: Milinda Pointer, MD - Appointments are:  Medication management: Monday and Wednesday 8:00 AM to 4:00 PM Procedure day: Tuesday and Thursday 7:30 AM to 4:00 PM Gillis Santa, MD - Appointments are:  Medication management: Tuesday and Thursday 8:00 AM to 4:00 PM Procedure day: Monday and Wednesday 7:30 AM to 4:00 PM (Last update: 09/25/2017) ____________________________________________________________________________________________

## 2019-05-05 NOTE — Telephone Encounter (Signed)
It looks like Dr Consuela Mimes ordered a CMET for her.  She is scheduled to see me on 05/12/19.  I have an extensive blood panel already ordered for her. She should actually schedule a LAB ONLY visit with [place] to get all of her blood drawn before 05/12/19.  My lab tests will include a CMET, therefore it should be no problem and that will be what Dr Consuela Mimes needs.  I sent him a message on Epic to ask if this would work. I can let you know if I get a response.  For now - please call her daughter and notify her that we can proceed with blood at our office before her upcoming apt. And we are asking Dr Consuela Mimes if that is all she needs to do.  Nobie Putnam, DO [facility] 05/05/2019, 5:54 PM

## 2019-05-05 NOTE — Telephone Encounter (Signed)
Attempted to call for pre appointment review of meds/allergies. No answer.

## 2019-05-05 NOTE — Progress Notes (Signed)
Pain Management Virtual Encounter Note - Virtual Visit via Telephone Telehealth (real-time audio visits between healthcare provider and patient).   Patient's Phone No. & Preferred Pharmacy:  [phone] (home); [phone] (mobile); (Preferred) [phone] No e-mail address on record  [place], [address] [place] [address] Phone: [phone] Fax: [phone]    Pre-screening note:  Our staff contacted [name] and offered her an "in person", "face-to-face" appointment versus a telephone encounter. She indicated preferring the telephone encounter, at this time.   Reason for Virtual Visit: COVID-19*  Social distancing based on CDC and AMA recommendations.   I contacted Brittney Simpson on 05/05/2019 via telephone.      I clearly identified myself as Brittney Cola, MD. I verified that I was speaking with the correct person using two identifiers (Name: Brittney Simpson, and date of birth: 11-09-1927).  Advanced Informed Consent I sought verbal advanced consent from Brittney Simpson for virtual visit interactions. I informed Brittney Simpson of possible security and privacy concerns, risks, and limitations associated with providing "not-in-person" medical evaluation and management services. I also informed [name] of the availability of "in-person" appointments. Finally, I informed her that there would be a charge for the virtual visit and that she could be  personally, fully or partially, financially responsible for it. [name] expressed understanding and agreed to proceed.   Historic Elements   [name] is a 85 y.o. year old, female patient evaluated today after her last encounter by our practice on 05/04/2019. [name]  has a past medical history of Anxiety, Back ache, Hiatal hernia, Hyperlipidemia, Hypertension, Hypothyroid, Osteopenia, Thyroid disease, and UTI (urinary tract infection). She also  has a past  surgical history that includes Hernia repair; Hernia repair; Hemorrhoid surgery; and Kyphoplasty (N/A, 01/23/2017). [name] has a current medication list which includes the following prescription(s): acetaminophen, amlodipine, lidocaine, brimonidine, ciprofloxacin, clonazepam, cranberry, vitamin d2, furosemide, latanoprost, levothyroxine, lisinopril, metoprolol succinate, omeprazole, sulfamethoxazole-trimethoprim, timolol, tramadol, and trazodone. She  reports that she has never smoked. She has never used smokeless tobacco. She reports that she does not drink alcohol or use drugs. [name] has No Known Allergies.   HPI  Today, she is being contacted for both, medication management and a post-procedure assessment.  Post-Procedure Evaluation  Procedure: Palliative bilateral lumbar facet block under fluoroscopic guidance, no sedation Pre-procedure pain level:  7/10 Post-procedure: 0/10 (100% relief)  Sedation: None.  Effectiveness during initial hour after procedure(Ultra-Short Term Relief):   100%  Local anesthetic used: Long-acting (4-6 hours) Effectiveness: Defined as any analgesic benefit obtained secondary to the administration of local anesthetics. This carries significant diagnostic value as to the etiological location, or anatomical origin, of the pain. Duration of benefit is expected to coincide with the duration of the local anesthetic used.  Effectiveness during initial 4-6 hours after procedure(Short-Term Relief):   100%  Long-term benefit: Defined as any relief past the pharmacologic duration of the local anesthetics.  Effectiveness past the initial 6 hours after procedure(Long-Term Relief):   90 %  Current benefits: Defined as benefit that persist at this time.   Analgesia:  90-100% better Function: [name] reports improvement in function ROM: [name] reports improvement in ROM  Note: Unfortunately, it is beginning to wear off and the patient would like to come in  to have it repeated.  Pharmacotherapy Assessment  Analgesic: Tramadol 50 mg, 1 tab PO q 8 hrs (150 mg/day of  tramadol) MME/day: 15 mg/day.   Monitoring: Pharmacotherapy: No side-effects or adverse reactions reported. [place] PMP: PDMP reviewed during this encounter.       Compliance: No problems identified. Effectiveness: Clinically acceptable. Plan: Refer to "POC".  UDS:  Summary  Date Value Ref Range Status  10/28/2017 FINAL  Final    Comment:    ==================================================================== TOXASSURE SELECT 13 (MW) ==================================================================== Test                             Result       Flag       Units Drug Present and Declared for Prescription Verification   Tramadol                       >20000       EXPECTED   ng/mg creat   O-Desmethyltramadol            >20000       EXPECTED   ng/mg creat   N-Desmethyltramadol            8244         EXPECTED   ng/mg creat    Source of tramadol is a prescription medication.    O-desmethyltramadol and N-desmethyltramadol are expected    metabolites of tramadol. Drug Present not Declared for Prescription Verification   7-aminoclonazepam              248          UNEXPECTED ng/mg creat    7-aminoclonazepam is an expected metabolite of clonazepam. Source    of clonazepam is a scheduled prescription medication. ==================================================================== Test                      Result    Flag   Units      Ref Range   Creatinine              25               mg/dL      >=20 ==================================================================== Declared Medications:  The flagging and interpretation on this report are based on the  following declared medications.  Unexpected results may arise from  inaccuracies in the declared medications.  **Note: The testing scope of this panel includes these medications:  Tramadol  **Note: The testing scope of this panel  does not include following  reported medications:  Acetaminophen  Amlodipine  Brimonidine Tartrate  Cranberry Extract  Furosemide  Latanoprost  Levothyroxine  Lidocaine  Lisinopril  Metoprolol  Omeprazole  Timolol  Trazodone  Vitamin D2 ==================================================================== For clinical consultation, please call [phone]. ====================================================================    Laboratory Chemistry Profile (12 Simpson)  Renal: 08/19/2018: Creatinine, Ser 0.90  Lab Results  Component Value Date   GFRAA 76 12/23/2017   GFRNONAA 65 12/23/2017   Hepatic: No results found for requested labs within last 8760 hours. Lab Results  Component Value Date   AST 11 11/22/2016   ALT 9 11/22/2016   Other: No results found for requested labs within last 8760 hours. Note: Above Lab results reviewed.  Imaging  Last 90 days:  [facility] C-arm 1-60 Min No Report  Result Date: 02/16/2019 Fluoro was used, but no Radiologist interpretation will be provided. Please refer to "NOTES" tab for provider progress note.  Aaa Duplex  Result Date: 03/16/2019 ABDOMINAL AORTA STUDY Indications: Follow up exam for known AAA.  Comparison  Study: 03/10/2018 Performing Technologist: Almira Coaster RVS  Examination Guidelines: A complete evaluation includes B-mode imaging, spectral Doppler, color Doppler, and power Doppler as needed of all accessible portions of each vessel. Bilateral testing is considered an integral part of a complete examination. Limited examinations for reoccurring indications may be performed as noted.  Abdominal Aorta Findings: +-----------+-------+----------+----------+----------+--------+--------+ Location   AP (cm)Trans (cm)PSV (cm/s)Waveform  ThrombusComments +-----------+-------+----------+----------+----------+--------+--------+ Proximal   3.70   4.16      155       monophasic                  +-----------+-------+----------+----------+----------+--------+--------+ Mid        4.00   4.61      236       monophasic                 +-----------+-------+----------+----------+----------+--------+--------+ Distal     4.07   4.21      103       monophasic                 +-----------+-------+----------+----------+----------+--------+--------+ RT CIA Prox1.3    1.3       157       biphasic                   +-----------+-------+----------+----------+----------+--------+--------+ LT CIA Prox1.2    1.3       143       biphasic                   +-----------+-------+----------+----------+----------+--------+--------+  Summary: Abdominal Aorta: There is evidence of abnormal dilitation of the proximal, mid and distal Abdominal aorta. 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 The largest aortic measurement is 4.6 cm. The  largest aortic diameter has increased compared to prior exam. Previous diameter measurement was 3.2 cm obtained on 03/10/2018.  *See table(s) above for measurements and observations.  Electronically signed by Leotis Pain MD on 03/16/2019 at 3:52:59 PM.   Final     Assessment  The primary encounter diagnosis was Chronic pain syndrome. Diagnoses of Chronic low back pain (Primary Area of Pain) (Bilateral) (R>L), Chronic hip pain (Secondary area of Pain) (Right), Chronic lower extremity pain (Third area of Pain) (Right), Lumbar facet syndrome (Bilateral) (R>L), Lumbar facet hypertrophy, and Pharmacologic therapy were also pertinent to this visit.  Plan of Care  I am having Brittney Simpson maintain her Vitamin D2, brimonidine, latanoprost, timolol, acetaminophen, ASPERCR[place]E LIDOCAINE EX, Cranberry, lisinopril, omeprazole, levothyroxine, traZODone, metoprolol succinate, sulfamethoxazole-trimethoprim, clonazePAM, ciprofloxacin, furosemide, amLODipine, and traMADol.  Pharmacotherapy (Medications  Ordered): Meds ordered this encounter  Medications  . traMADol (ULTRAM) 50 MG tablet    Sig: Take 1 tablet (50 mg total) by mouth 3 (three) times daily as needed for severe pain.    Dispense:  90 tablet    Refill:  5    Chronic Pain: STOP Act (Not applicable) Fill 1 day early if closed on refill date. Do not fill until: 05/05/2019. To last until: 11/01/2019. Avoid benzodiazepines within 8 hours of opioids   Orders:  Orders Placed This Encounter  Procedures  . LUMBAR FACET(MEDIAL BRANCH NERVE BLOCK) MBNB    Scheduling timeframe: (PRN procedure) Ms. Kurtzer will call when needed. Clinical indication: Axial low back pain. Lumbosacral Spondylosis (M47.897).  Sedation: Usually done with sedation. (May be done without sedation if so desired by patient.) Requirements: NPO x 8 hrs.; Driver; Stop blood thinners.  Standing Status:   Standing    Number of Occurrences:   5    Standing Expiration Date:   05/04/2020    Scheduling Instructions:     Procedure: Lumbar facet block (medial branch block).     Level: L3-4, L4-5, & L5-S1 Facets (L2, L3, L4, L5, & S1 Medial Branch Nerves)     Laterality: Bilateral    Order Specific Question:   Where will this procedure be performed?    Answer:   [facility]  . LUMBAR FACET(MEDIAL BRANCH NERVE BLOCK) MBNB    Standing Status:   Future    Standing Expiration Date:   06/05/2019    Scheduling Instructions:     Side: Bilateral     Level: L3-4, L4-5, & L5-S1 Facets (L2, L3, L4, L5, & S1 Medial Branch Nerves)     Sedation: Patient's choice.     Timeframe: ASAA    Order Specific Question:   Where will this procedure be performed?    Answer:   [facility]  . Comp. Metabolic Panel (12)    With GFR. Indications: Chronic Pain Syndrome (G89.4) & Pharmacotherapy [id number])    Order Specific Question:   Has the patient fasted?    Answer:   No    Order Specific Question:   CC Results    Answer:   PCP-NURSE [id number]   Follow-up plan:   Return in  about 6 months (around 11/01/2019) for Procedure (No sedation): (B) L-FCT BLK.     Interventional management options:  Considering: Possible bilateral lumbar facet RFA.  Diagnostic right hip joint injection Diagnostic right L3 TFESI Diagnostic right trochanteric bursa injection Diagnostic right SI joint block   Palliative PRN treatment(s): Palliativeright lumbar facet block #10 Palliativeleft lumbar facet block #8 Palliative right L3-4 LESI    Recent Visits Date Type Provider Dept  02/16/19 Procedure visit Milinda Pointer, MD [facility]  Showing recent visits within past 90 days and meeting all other requirements   Today's Visits Date Type Provider Dept  05/05/19 Office Visit Milinda Pointer, MD [facility]  Showing today's visits and meeting all other requirements   Future Appointments No visits were found meeting these conditions.  Showing future appointments within next 90 days and meeting all other requirements   I discussed the assessment and treatment plan with the patient. The patient was provided an opportunity to ask questions and all were answered. The patient agreed with the plan and demonstrated an understanding of the instructions.  Patient advised to call back or seek an in-person evaluation if the symptoms or condition worsens.  Total duration of non-face-to-face encounter: 13 minutes.  Note by: Brittney Cola, MD Date: 05/05/2019; Time: 12:19 PM  Note: This dictation was prepared with Dragon dictation. Any transcriptional errors that may result from this process are unintentional.  Disclaimer:  * Given the special circumstances of the COVID-19 pandemic, the federal government has announced that the Office for Civil Rights (OCR) will exercise its enforcement discretion and will not impose penalties on physicians using telehealth in the event of noncompliance with regulatory requirements under the [place] (HIPAA) in connection with the good faith provision of telehealth during the XX123456 national public health emergency. ([place])

## 2019-05-05 NOTE — Telephone Encounter (Signed)
Pt daughter called wanted to know if she could come to our labs to have mothers labs drawn for Pain med.Pt daugther is requesting a call back [phone]

## 2019-05-06 ENCOUNTER — Telehealth: Payer: Self-pay | Admitting: Family Medicine

## 2019-05-06 ENCOUNTER — Encounter: Payer: Medicare Other | Admitting: Nurse Practitioner

## 2019-05-06 ENCOUNTER — Telehealth: Payer: Self-pay | Admitting: *Deleted

## 2019-05-06 NOTE — Telephone Encounter (Signed)
Pt daughters was called back and informed that its ok for her mother to get her lab done at her PCP.

## 2019-05-06 NOTE — Telephone Encounter (Signed)
The pt daughter was notified. She verbalize understanding, no questions or concern.

## 2019-05-10 ENCOUNTER — Other Ambulatory Visit: Payer: Self-pay

## 2019-05-10 ENCOUNTER — Other Ambulatory Visit: Payer: Medicare Other

## 2019-05-10 DIAGNOSIS — E782 Mixed hyperlipidemia: Secondary | ICD-10-CM | POA: Diagnosis not present

## 2019-05-10 DIAGNOSIS — E034 Atrophy of thyroid (acquired): Secondary | ICD-10-CM | POA: Diagnosis not present

## 2019-05-10 DIAGNOSIS — N183 Chronic kidney disease, stage 3 unspecified: Secondary | ICD-10-CM | POA: Diagnosis not present

## 2019-05-10 DIAGNOSIS — R7309 Other abnormal glucose: Secondary | ICD-10-CM

## 2019-05-10 DIAGNOSIS — I10 Essential (primary) hypertension: Secondary | ICD-10-CM | POA: Diagnosis not present

## 2019-05-11 LAB — CBC WITH DIFFERENTIAL/PLATELET
Absolute Monocytes: 461 cells/uL (ref 200–950)
Basophils Absolute: 32 cells/uL (ref 0–200)
Basophils Relative: 0.6 %
Eosinophils Absolute: 21 cells/uL (ref 15–500)
Eosinophils Relative: 0.4 %
HCT: 39.9 % (ref 35.0–45.0)
Hemoglobin: 12.7 g/dL (ref 11.7–15.5)
Lymphs Abs: 1675 cells/uL (ref 850–3900)
MCH: 27.1 pg (ref 27.0–33.0)
MCHC: 31.8 g/dL — ABNORMAL LOW (ref 32.0–36.0)
MCV: 85.1 fL (ref 80.0–100.0)
MPV: 10.6 fL (ref 7.5–12.5)
Monocytes Relative: 8.7 %
Neutro Abs: 3111 cells/uL (ref 1500–7800)
Neutrophils Relative %: 58.7 %
Platelets: 204 10*3/uL (ref 140–400)
RBC: 4.69 10*6/uL (ref 3.80–5.10)
RDW: 13.8 % (ref 11.0–15.0)
Total Lymphocyte: 31.6 %
WBC: 5.3 10*3/uL (ref 3.8–10.8)

## 2019-05-11 LAB — LIPID PANEL
Cholesterol: 262 mg/dL — ABNORMAL HIGH (ref <200)
HDL: 36 mg/dL — ABNORMAL LOW (ref >=50)
LDL Cholesterol (Calc): 187 mg/dL (calc) — ABNORMAL HIGH
Non-HDL Cholesterol (Calc): 226 mg/dL (calc) — ABNORMAL HIGH (ref <130)
Total CHOL/HDL Ratio: 7.3 (calc) — ABNORMAL HIGH (ref <5.0)
Triglycerides: 201 mg/dL — ABNORMAL HIGH (ref <150)

## 2019-05-11 LAB — HEMOGLOBIN A1C
Hgb A1c MFr Bld: 5.5 % of total Hgb (ref <5.7)
Mean Plasma Glucose: 111 (calc)
eAG (mmol/L): 6.2 (calc)

## 2019-05-11 LAB — COMPLETE METABOLIC PANEL WITH GFR
AG Ratio: 1.4 (calc) (ref 1.0–2.5)
ALT: 9 U/L (ref 6–29)
AST: 12 U/L (ref 10–35)
Albumin: 4.1 g/dL (ref 3.6–5.1)
Alkaline phosphatase (APISO): 62 U/L (ref 37–153)
BUN/Creatinine Ratio: 11 (calc) (ref 6–22)
BUN: 11 mg/dL (ref 7–25)
CO2: 29 mmol/L (ref 20–32)
Calcium: 9.7 mg/dL (ref 8.6–10.4)
Chloride: 98 mmol/L (ref 98–110)
Creat: 0.97 mg/dL — ABNORMAL HIGH (ref 0.60–0.88)
GFR, Est African American: 60 mL/min/{1.73_m2} (ref >=60)
GFR, Est Non African American: 52 mL/min/{1.73_m2} — ABNORMAL LOW (ref >=60)
Globulin: 3 g/dL (calc) (ref 1.9–3.7)
Glucose, Bld: 99 mg/dL (ref 65–99)
Potassium: 4 mmol/L (ref 3.5–5.3)
Sodium: 137 mmol/L (ref 135–146)
Total Bilirubin: 0.4 mg/dL (ref 0.2–1.2)
Total Protein: 7.1 g/dL (ref 6.1–8.1)

## 2019-05-11 LAB — T4, FREE: Free T4: 1.5 ng/dL (ref 0.8–1.8)

## 2019-05-11 LAB — TSH: TSH: 1.91 mIU/L (ref 0.40–4.50)

## 2019-05-12 ENCOUNTER — Other Ambulatory Visit: Payer: Self-pay

## 2019-05-12 ENCOUNTER — Encounter: Payer: Self-pay | Admitting: Family Medicine

## 2019-05-12 ENCOUNTER — Ambulatory Visit (INDEPENDENT_AMBULATORY_CARE_PROVIDER_SITE_OTHER): Payer: Medicare Other | Admitting: Family Medicine

## 2019-05-12 VITALS — BP 157/66 | HR 53 | Temp 98.3°F | Resp 16 | Ht 65.0 in | Wt 132.0 lb

## 2019-05-12 DIAGNOSIS — L989 Disorder of the skin and subcutaneous tissue, unspecified: Secondary | ICD-10-CM

## 2019-05-12 DIAGNOSIS — E782 Mixed hyperlipidemia: Secondary | ICD-10-CM | POA: Diagnosis not present

## 2019-05-12 DIAGNOSIS — F419 Anxiety disorder, unspecified: Secondary | ICD-10-CM

## 2019-05-12 DIAGNOSIS — N1831 Chronic kidney disease, stage 3a: Secondary | ICD-10-CM | POA: Diagnosis not present

## 2019-05-12 DIAGNOSIS — Z23 Encounter for immunization: Secondary | ICD-10-CM | POA: Diagnosis not present

## 2019-05-12 DIAGNOSIS — E034 Atrophy of thyroid (acquired): Secondary | ICD-10-CM

## 2019-05-12 DIAGNOSIS — F5101 Primary insomnia: Secondary | ICD-10-CM

## 2019-05-12 DIAGNOSIS — I129 Hypertensive chronic kidney disease with stage 1 through stage 4 chronic kidney disease, or unspecified chronic kidney disease: Secondary | ICD-10-CM | POA: Diagnosis not present

## 2019-05-12 DIAGNOSIS — N183 Chronic kidney disease, stage 3 unspecified: Secondary | ICD-10-CM | POA: Diagnosis not present

## 2019-05-12 MED ORDER — LISINOPRIL 40 MG PO TABS: 40.0000 mg | ORAL_TABLET | Freq: Every day | ORAL | 5 refills | Status: DC

## 2019-05-12 MED ORDER — CLONAZEPAM 0.5 MG PO TABS: 0.2500 mg | ORAL_TABLET | Freq: Two times a day (BID) | ORAL | 2 refills | Status: DC

## 2019-05-12 MED ORDER — FUROSEMIDE 20 MG PO TABS: 30.0000 mg | ORAL_TABLET | Freq: Every day | ORAL | 5 refills | Status: DC | PRN

## 2019-05-12 MED ORDER — METOPROLOL SUCCINATE ER 100 MG PO TB24: 100.0000 mg | ORAL_TABLET | Freq: Every day | ORAL | 5 refills | Status: DC

## 2019-05-12 NOTE — Patient Instructions (Addendum)
Referral sent to [facility] - stay tuned for apt  Address: [address], [place], [place] [address] Phone: [phone]  Non healing skin lesion of external ear.  Can keep using topical neosporin if need.  Continue current meds, refilled as requested.  Flu Shot today  No blood next visit.  Please schedule a Follow-up Appointment to: Return in about 6 months (around 11/10/2019) for 6 month follow-up .  If you have any other questions or concerns, please feel free to call the office or send a message through [place]. You may also schedule an earlier appointment if necessary.  Additionally, you may be receiving a survey about your experience at our office within a few days to 1 week by e-mail or mail. We value your feedback.  Nobie Putnam, DO [place]

## 2019-05-12 NOTE — Assessment & Plan Note (Signed)
Reportedly stable home BP Complicated by CKD-III  Plan: 1. Continue current regimen - Amlodipine 5mg  daily, Lisinopril 40mg  daily, Metoprolol XL 100mg  daily - May continue Lasix 30mg  daily now per Nephrology - Future reconsider med dose changes if need 2. Encourage to stay active, low sodium diet, improve hydration with water

## 2019-05-12 NOTE — Assessment & Plan Note (Signed)
Stable, chronic underlying problem likely related to variety factors age altered sleep cycle, anxiety, chronic pain  Plan: 1. Continue Trazodone continue 50mg whole tab nightly Continue BDZ Clonazepam 

## 2019-05-12 NOTE — Assessment & Plan Note (Signed)
Stable, chronic anxiety, with associated insomnia Chronically on BDZ without history of abuse or dose increase Checked [place] CSRS without red flag  Plan: 1. Reviewed chronic BDZ use for anxiety, high risk sedation, fall, confusion, withdrawal risks - she is aware of most of these, and med tolerated very well, aware not to increase dose - Continue Clonazepam 0.5mg  HALF tab BID - due for refill today, can continue for 6 months until next apt, call in 3 months

## 2019-05-12 NOTE — Progress Notes (Signed)
Subjective:    Patient ID: Brittney Simpson, female    DOB: [birth year], 85 y.o.   MRN: [id number]  CHAUNTIA RICO is a 85 y.o. female presenting on 05/12/2019 for Hypertension   HPI   History provided primarily by daughter, [name], primary caregiver. Patient provides additional history.  Specialist: Nephrology - Dr Lavonia Dana [facility]) Pain Management - Dr Milinda Pointer [facility]) Vascular - Dr Leotis Pain ([facility])   CHRONIC HTN / CKD-III Followed by Dr Juleen China [facility] q 6 months BP readings remain stable, occasionally elevated SBP is consistent for her Home readings recently fairly improved but has some chronic hard to control HTN Current Meds - Amlodipine 5mg  daily, Lisinopril 40mg  daily, Metoprolol XL 100mg  daily, Lasix 30mg  daily (taking regularly due to edema - daily) - There was concern from Nephrology that she had some retained fluid in chest/heart and they inc lasix from 20 to 30 with good results, have advised to keep that dose. Reports good compliance, took meds today. Tolerating well, w/o complaints. Admits edema ankles improved on lasix higher dose Denies CP, dyspnea, HA, dizziness / lightheadedness  Hypothyroidism Chronic problem. Taking Levothyroxine 168mcg daily currently. Last thyroid result was on 04/2019, was normal TSH and Free T4 normal  Chronic Low Back Pain, Lumbar DJD Spinal Stenosis / Facet DJD Followed by [facility] and [facility] prior background information - No complaint of pain today - Had our lab CMET, which is also needed by Dr Consuela Mimes they will see her for injection in future.  ANXIETY / INSOMNIA Previous visit continued Trazodone 50mg  nightly for sleep and chronic Clonazepam, see prior notes for background information. - Today patient reportsno new concerns. She is doing well. Anxiety is controlled. Sleeping well on medicine. - Due for refill Clonazepam  -Denies any problems with withdrawal from BDZ, aware of risks of sedation and potential fall risk at age  Hyperlipidemia Elevated LDL cholesterol Prior concern for statins and side effects, declines to take.  Recurrent UTI On Bactrim antibiotic daily for prophylaxis UTI by Nephrology Taking Cipro PRN UTI flare up as advised, has one rx now to pick up and needed refills on file. Occasionally takes but not often. Doing well with this plan.  Additional complaint Left ear external non healing lesion - reports few months or longer now with recurrent scab and sore spot on left ear. She has used topical neosporin and it hasn't healed, interested for further treatment, last seen [facility] on [address] >5 years ago asking to go back.  Health Maintenance: Due for Flu Shot, will receive today    Depression screen [facility] 2/9 05/12/2019 12/16/2018 08/25/2018  Decreased Interest 0 0 0  Down, Depressed, Hopeless 0 0 0  PHQ - 2 Score 0 0 0  Altered sleeping - 0 0  Tired, decreased energy - 0 0  Change in appetite - 0 0  Feeling bad or failure about yourself  - 0 -  Trouble concentrating - 0 0  Moving slowly or fidgety/restless - 0 0  Suicidal thoughts - 0 0  PHQ-9 Score - 0 0  Difficult doing work/chores - Not difficult at all Not difficult at all    Past Medical History:  Diagnosis Date  . Anxiety   . Back ache   . Hiatal hernia   . Hyperlipidemia   . Hypertension   . Hypothyroid   . Osteopenia   . Thyroid disease   . UTI (urinary  tract infection)    Past Surgical History:  Procedure Laterality Date  . HEMORRHOID SURGERY    . HERNIA REPAIR    . HERNIA REPAIR    . KYPHOPLASTY N/A 01/23/2017   Procedure: [id number];  Surgeon: Hessie Knows, MD;  Location: [facility] ORS;  Service: Orthopedics;  Laterality: N/A;   Social History   Socioeconomic History  . Marital status: Widowed    Spouse name: Not on file  . Number of children: Not on file  . Years of education: Not on  file  . Highest education level: Not on file  Occupational History  . Not on file  Social Needs  . Financial resource strain: Not on file  . Food insecurity    Worry: Not on file    Inability: Not on file  . Transportation needs    Medical: Not on file    Non-medical: Not on file  Tobacco Use  . Smoking status: Never Smoker  . Smokeless tobacco: Never Used  Substance and Sexual Activity  . Alcohol use: No    Alcohol/week: 0.0 standard drinks  . Drug use: No  . Sexual activity: Yes    Birth control/protection: Injection  Lifestyle  . Physical activity    Days per week: Not on file    Minutes per session: Not on file  . Stress: Not on file  Relationships  . Social Herbalist on phone: Not on file    Gets together: Not on file    Attends religious service: Not on file    Active member of club or organization: Not on file    Attends meetings of clubs or organizations: Not on file    Relationship status: Not on file  . Intimate partner violence    Fear of current or ex partner: Not on file    Emotionally abused: Not on file    Physically abused: Not on file    Forced sexual activity: Not on file  Other Topics Concern  . Not on file  Social History Narrative  . Not on file   Family History  Problem Relation Age of Onset  . Stroke Mother   . Cancer Father    Current Outpatient Medications on File Prior to Visit  Medication Sig  . acetaminophen (TYLENOL) 500 MG tablet Take 1,000 mg by mouth daily as needed for moderate pain or headache.  Marland Kitchen amLODipine (NORVASC) 5 MG tablet TAKE 1 TABLET BY MOUTH ONCE DAILY  . ASPERCREME LIDOCAINE EX Apply 1 application topically.  . brimonidine (ALPHAGAN) 0.2 % ophthalmic solution Place 1 drop into the left eye 2 (two) times daily.   . ciprofloxacin (CIPRO) 500 MG tablet Take 1 tablet (500 mg total) by mouth 2 (two) times daily. Give 2 hours apart from vitamins, iron, and antacids. - Start if develop new UTI  . Cranberry 250  MG CAPS Take 250 mg by mouth daily.  . Ergocalciferol (VITAMIN D2) 2000 UNITS TABS Take 2,000 Units by mouth daily with lunch.   . latanoprost (XALATAN) 0.005 % ophthalmic solution Place 1 drop into the left eye at bedtime.   Marland Kitchen levothyroxine (SYNTHROID, LEVOTHROID) 112 MCG tablet TAKE 1 TABLET BY MOUTH ONCE DAILY ON AN EMPTY STOMACH. WAIT 30 MINUTES BEFORE TAKING OTHER MEDS.  Marland Kitchen omeprazole (PRILOSEC) 20 MG capsule TAKE 1 CAPSULE BY MOUTH ONCE DAILY  . sulfamethoxazole-trimethoprim (BACTRIM) 400-80 MG tablet   . timolol (TIMOPTIC) 0.5 % ophthalmic solution Place 1 drop into the left eye 2 (two)  times daily.   . traMADol (ULTRAM) 50 MG tablet Take 1 tablet (50 mg total) by mouth 3 (three) times daily as needed for severe pain.  . traZODone (DESYREL) 50 MG tablet TAKE 1 TABLET BY MOUTH AT BEDTIME   No current facility-administered medications on file prior to visit.     Review of Systems Per HPI unless specifically indicated above     Objective:    BP (!) 157/66   Pulse (!) 53   Temp 98.3 F (36.8 C) (Oral)   Resp 16   Ht 5\' 5"  (1.651 m)   Wt 132 lb (59.9 kg)   BMI 21.97 kg/m   Wt Readings from Last 3 Encounters:  05/12/19 132 lb (59.9 kg)  03/16/19 128 lb (58.1 kg)  02/16/19 127 lb (57.6 kg)    Physical Exam Vitals signs and nursing note reviewed.  Constitutional:      General: She is not in acute distress.    Appearance: She is well-developed. She is not diaphoretic.     Comments: Well-appearing, comfortable, cooperative  HENT:     Head: Normocephalic and atraumatic.     Right Ear: Tympanic membrane and ear canal normal.     Left Ear: Tympanic membrane and ear canal normal.     Ears:     Comments: Left ear - external ear centralized area with small 1 - 2 mm area of scab non healing skin. No extension of erythema or open ulceration Eyes:     General:        Right eye: No discharge.        Left eye: No discharge.     Conjunctiva/sclera: Conjunctivae normal.  Neck:      Musculoskeletal: Normal range of motion and neck supple.     Thyroid: No thyromegaly.  Cardiovascular:     Rate and Rhythm: Normal rate and regular rhythm.     Heart sounds: Normal heart sounds. No murmur.  Pulmonary:     Effort: Pulmonary effort is normal. No respiratory distress.     Breath sounds: Normal breath sounds. No wheezing or rales.  Musculoskeletal: Normal range of motion.     Right lower leg: Edema (trace) present.     Left lower leg: Edema (trace) present.  Lymphadenopathy:     Cervical: No cervical adenopathy.  Skin:    General: Skin is warm and dry.     Findings: No erythema or rash.  Neurological:     Mental Status: She is alert and oriented to person, place, and time.  Psychiatric:        Behavior: Behavior normal.     Comments: Well groomed, good eye contact, normal speech and thoughts    Results for orders placed or performed in visit on 05/10/19  T4, free  Result Value Ref Range   Free T4 1.5 0.8 - 1.8 ng/dL  TSH  Result Value Ref Range   TSH 1.91 0.40 - 4.50 mIU/L  Lipid panel  Result Value Ref Range   Cholesterol 262 (H) <200 mg/dL   HDL 36 (L) > OR = 50 mg/dL   Triglycerides 201 (H) <150 mg/dL   LDL Cholesterol (Calc) 187 (H) mg/dL (calc)   Total CHOL/HDL Ratio 7.3 (H) <5.0 (calc)   Non-HDL Cholesterol (Calc) 226 (H) <130 mg/dL (calc)  COMPLETE METABOLIC PANEL WITH GFR  Result Value Ref Range   Glucose, Bld 99 65 - 99 mg/dL   BUN 11 7 - 25 mg/dL   Creat 0.97 (H) 0.60 -  0.88 mg/dL   GFR, Est Non African American 52 (L) > OR = 60 mL/min/1.33m2   GFR, Est African American 60 > OR = 60 mL/min/1.104m2   BUN/Creatinine Ratio 11 6 - 22 (calc)   Sodium 137 135 - 146 mmol/L   Potassium 4.0 3.5 - 5.3 mmol/L   Chloride 98 98 - 110 mmol/L   CO2 29 20 - 32 mmol/L   Calcium 9.7 8.6 - 10.4 mg/dL   Total Protein 7.1 6.1 - 8.1 g/dL   Albumin 4.1 3.6 - 5.1 g/dL   Globulin 3.0 1.9 - 3.7 g/dL (calc)   AG Ratio 1.4 1.0 - 2.5 (calc)   Total Bilirubin 0.4 0.2 -  1.2 mg/dL   Alkaline phosphatase (APISO) 62 37 - 153 U/L   AST 12 10 - 35 U/L   ALT 9 6 - 29 U/L  CBC with Differential/Platelet  Result Value Ref Range   WBC 5.3 3.8 - 10.8 Thousand/uL   RBC 4.69 3.80 - 5.10 Million/uL   Hemoglobin 12.7 11.7 - 15.5 g/dL   HCT 39.9 35.0 - 45.0 %   MCV 85.1 80.0 - 100.0 fL   MCH 27.1 27.0 - 33.0 pg   MCHC 31.8 (L) 32.0 - 36.0 g/dL   RDW 13.8 11.0 - 15.0 %   Platelets 204 140 - 400 Thousand/uL   MPV 10.6 7.5 - 12.5 fL   Neutro Abs 3,111 1,500 - 7,800 cells/uL   Lymphs Abs 1,675 850 - 3,900 cells/uL   Absolute Monocytes 461 200 - 950 cells/uL   Eosinophils Absolute 21 15 - 500 cells/uL   Basophils Absolute 32 0 - 200 cells/uL   Neutrophils Relative % 58.7 %   Total Lymphocyte 31.6 %   Monocytes Relative 8.7 %   Eosinophils Relative 0.4 %   Basophils Relative 0.6 %  Hemoglobin A1c  Result Value Ref Range   Hgb A1c MFr Bld 5.5 <5.7 % of total Hgb   Mean Plasma Glucose 111 (calc)   eAG (mmol/L) 6.2 (calc)      Assessment & Plan:   Problem List Items Addressed This Visit    Benign hypertension with CKD (chronic kidney disease) stage III (HCC) - Primary    Reportedly stable home BP Complicated by CKD-III  Plan: 1. Continue current regimen - Amlodipine 5mg  daily, Lisinopril 40mg  daily, Metoprolol XL 100mg  daily - May continue Lasix 30mg  daily now per Nephrology - Future reconsider med dose changes if need 2. Encourage to stay active, low sodium diet, improve hydration with water      Relevant Medications   furosemide (LASIX) 20 MG tablet   metoprolol succinate (TOPROL-XL) 100 MG 24 hr tablet   lisinopril (ZESTRIL) 40 MG tablet   Chronic anxiety    Stable, chronic anxiety, with associated insomnia Chronically on BDZ without history of abuse or dose increase Checked [place] CSRS without red flag  Plan: 1. Reviewed chronic BDZ use for anxiety, high risk sedation, fall, confusion, withdrawal risks - she is aware of most of these, and med  tolerated very well, aware not to increase dose - Continue Clonazepam 0.5mg  HALF tab BID - due for refill today, can continue for 6 months until next apt, call in 3 months      Relevant Medications   clonazePAM (KLONOPIN) 0.5 MG tablet   CKD (chronic kidney disease) stage 3, GFR 30-59 ml/min   Hyperlipidemia    Elevated LDL on lipid panel Patient declines statin therapy or any cholesterol lowering therapy added at  this time, discussed benefit risk      Relevant Medications   furosemide (LASIX) 20 MG tablet   metoprolol succinate (TOPROL-XL) 100 MG 24 hr tablet   lisinopril (ZESTRIL) 40 MG tablet   Hypothyroidism    Controlled Last lab normal 04/2019 Continue levo 115mcg daily Re-check in 12 months      Relevant Medications   metoprolol succinate (TOPROL-XL) 100 MG 24 hr tablet   Insomnia    Stable, chronic underlying problem likely related to variety factors age altered sleep cycle, anxiety, chronic pain  Plan: 1. Continue Trazodone continue 50mg  whole tab nightly Continue BDZ Clonazepam       Other Visit Diagnoses    Needs flu shot       Relevant Orders   Flu Vaccine QUAD High Dose(Fluad) (Completed)   Non-healing skin lesion       Relevant Orders   Ambulatory referral to Dermatology    Referral back to [facility] due to now non healing lesion on external L ear painful, no sign of infection but concern recurrent non healing, failed topical antibiotic, concern possible superficial malignancy    Meds ordered this encounter  Medications  . furosemide (LASIX) 20 MG tablet    Sig: Take 1.5 tablets (30 mg total) by mouth daily as needed for fluid.    Dispense:  45 tablet    Refill:  5    Adding refills on file  . clonazePAM (KLONOPIN) 0.5 MG tablet    Sig: Take 0.5 tablets (0.25 mg total) by mouth 2 (two) times daily.    Dispense:  30 tablet    Refill:  2  . metoprolol succinate (TOPROL-XL) 100 MG 24 hr tablet    Sig: Take 1 tablet (100 mg total) by mouth  daily. Take with or immediately following a meal.    Dispense:  30 tablet    Refill:  5  . lisinopril (ZESTRIL) 40 MG tablet    Sig: Take 1 tablet (40 mg total) by mouth daily.    Dispense:  30 tablet    Refill:  5   Orders Placed This Encounter  Procedures  . Flu Vaccine QUAD High Dose(Fluad)  . Ambulatory referral to Dermatology    Referral Priority:   Routine    Referral Type:   Consultation    Referral Reason:   Specialty Services Required    Requested Specialty:   Dermatology    Number of Visits Requested:   1    Follow up plan: Return in about 6 months (around 11/10/2019) for 6 month follow-up .  Nobie Putnam, DO [facility] 05/12/2019, 4:11 PM

## 2019-05-12 NOTE — Assessment & Plan Note (Signed)
Controlled Last lab normal 04/2019 Continue levo 117mcg daily Re-check in 12 months

## 2019-05-12 NOTE — Assessment & Plan Note (Signed)
Elevated LDL on lipid panel Patient declines statin therapy or any cholesterol lowering therapy added at this time, discussed benefit risk

## 2019-05-18 ENCOUNTER — Ambulatory Visit: Payer: Medicare Other | Admitting: Pain Medicine

## 2019-05-18 DIAGNOSIS — M5137 Other intervertebral disc degeneration, lumbosacral region: Secondary | ICD-10-CM | POA: Insufficient documentation

## 2019-05-18 NOTE — Progress Notes (Deleted)
Patient's Name: Brittney Simpson  MRN: [id number]  Referring Provider: Nobie Putnam *  DOB: 01-Aug-1927  PCP: Olin Hauser, DO  DOS: 05/18/2019  Note by: Gaspar Cola, MD  Service setting: Ambulatory outpatient  Specialty: Interventional Pain Management  Patient type: Established  Location: [facility] (AMB) Pain Management Facility  Visit type: Interventional Procedure   Primary Reason for Visit: Interventional Pain Management Treatment. CC: No chief complaint on file.  Procedure:          Anesthesia, Analgesia, Anxiolysis:  Type: Lumbar Facet, Medial Branch Block(s) #R10/L8  Primary Purpose: Diagnostic Region: Posterolateral Lumbosacral Spine Level: L2, L3, L4, L5, & S1 Medial Branch Level(s). Injecting these levels blocks the L3-4, L4-5, and L5-S1 lumbar facet joints. Laterality: Bilateral  Type: Local Anesthesia Indication(s): Analgesia         Route: Infiltration ([place]/IM) IV Access: Declined Sedation: Declined  Local Anesthetic: Lidocaine 1-2%  Position: Prone   Indications: 1. Lumbar facet syndrome (Bilateral) (R>L)   2. Spondylosis without myelopathy or radiculopathy, lumbosacral region   3. Lumbar facet hypertrophy   4. Spondylolisthesis of lumbosacral region (L2-3 and L5-S1)   5. DDD (degenerative disc disease), lumbosacral   6. Chronic low back pain (Primary Area of Pain) (Bilateral) (R>L)    Pain Score: Pre-procedure:  /10 Post-procedure:  /10   Pre-op Assessment:  Brittney Simpson is a 85 y.o. (year old), female patient, seen today for interventional treatment. She  has a past surgical history that includes Hernia repair; Hernia repair; Hemorrhoid surgery; and Kyphoplasty (N/A, 01/23/2017). Brittney Simpson has a current medication list which includes the following prescription(s): acetaminophen, amlodipine, lidocaine, brimonidine, ciprofloxacin, clonazepam, cranberry, vitamin d2, furosemide, latanoprost, levothyroxine, lisinopril, metoprolol succinate,  omeprazole, sulfamethoxazole-trimethoprim, timolol, tramadol, and trazodone. Her primarily concern today is the No chief complaint on file.  Initial Vital Signs:  Pulse/HCG Rate:    Temp:   Resp:   BP:   SpO2:    BMI: Estimated body mass index is 21.97 kg/m as calculated from the following:   Height as of 05/12/19: 5\' 5"  (1.651 m).   Weight as of 05/12/19: 132 lb (59.9 kg).  Risk Assessment: Allergies: Reviewed. She has No Known Allergies.  Allergy Precautions: None required Coagulopathies: Reviewed. None identified.  Blood-thinner therapy: None at this time Active Infection(s): Reviewed. None identified. Brittney Simpson is afebrile  Site Confirmation: Brittney Simpson was asked to confirm the procedure and laterality before marking the site Procedure checklist: Completed Consent: Before the procedure and under the influence of no sedative(s), amnesic(s), or anxiolytics, the patient was informed of the treatment options, risks and possible complications. To fulfill our ethical and legal obligations, as recommended by the American Medical Association's Code of Ethics, I have informed the patient of my clinical impression; the nature and purpose of the treatment or procedure; the risks, benefits, and possible complications of the intervention; the alternatives, including doing nothing; the risk(s) and benefit(s) of the alternative treatment(s) or procedure(s); and the risk(s) and benefit(s) of doing nothing. The patient was provided information about the general risks and possible complications associated with the procedure. These may include, but are not limited to: failure to achieve desired goals, infection, bleeding, organ or nerve damage, allergic reactions, paralysis, and death. In addition, the patient was informed of those risks and complications associated to Spine-related procedures, such as failure to decrease pain; infection (i.e.: Meningitis, epidural or intraspinal abscess); bleeding  (i.e.: epidural hematoma, subarachnoid hemorrhage, or any other type of intraspinal or peri-dural bleeding); organ or  nerve damage (i.e.: Any type of peripheral nerve, nerve root, or spinal cord injury) with subsequent damage to sensory, motor, and/or autonomic systems, resulting in permanent pain, numbness, and/or weakness of one or several areas of the body; allergic reactions; (i.e.: anaphylactic reaction); and/or death. Furthermore, the patient was informed of those risks and complications associated with the medications. These include, but are not limited to: allergic reactions (i.e.: anaphylactic or anaphylactoid reaction(s)); adrenal axis suppression; blood sugar elevation that in diabetics may result in ketoacidosis or comma; water retention that in patients with history of congestive heart failure may result in shortness of breath, pulmonary edema, and decompensation with resultant heart failure; weight gain; swelling or edema; medication-induced neural toxicity; particulate matter embolism and blood vessel occlusion with resultant organ, and/or nervous system infarction; and/or aseptic necrosis of one or more joints. Finally, the patient was informed that Medicine is not an exact science; therefore, there is also the possibility of unforeseen or unpredictable risks and/or possible complications that may result in a catastrophic outcome. The patient indicated having understood very clearly. We have given the patient no guarantees and we have made no promises. Enough time was given to the patient to ask questions, all of which were answered to the patient's satisfaction. Brittney Simpson has indicated that she wanted to continue with the procedure. Attestation: I, the ordering provider, attest that I have discussed with the patient the benefits, risks, side-effects, alternatives, likelihood of achieving goals, and potential problems during recovery for the procedure that I have provided informed  consent. Date   Time: {CHL [facility]-PAIN TIME CHOICES:[id number]}  Pre-Procedure Preparation:  Monitoring: As per clinic protocol. Respiration, ETCO2, SpO2, BP, heart rate and rhythm monitor placed and checked for adequate function Safety Precautions: Patient was assessed for positional comfort and pressure points before starting the procedure. Time-out: I initiated and conducted the "Time-out" before starting the procedure, as per protocol. The patient was asked to participate by confirming the accuracy of the "Time Out" information. Verification of the correct person, site, and procedure were performed and confirmed by me, the nursing staff, and the patient. "Time-out" conducted as per Joint Commission's Universal Protocol (UP.01.01.01). Time:    Description of Procedure:          Laterality: Bilateral. The procedure was performed in identical fashion on both sides. Levels:  L2, L3, L4, L5, & S1 Medial Branch Level(s) Area Prepped: Posterior Lumbosacral Region Prepping solution: DuraPrep (Iodine Povacrylex [0.7% available iodine] and Isopropyl Alcohol, 74% w/w) Safety Precautions: Aspiration looking for blood return was conducted prior to all injections. At no point did we inject any substances, as a needle was being advanced. Before injecting, the patient was told to immediately notify me if she was experiencing any new onset of "ringing in the ears, or metallic taste in the mouth". No attempts were made at seeking any paresthesias. Safe injection practices and needle disposal techniques used. Medications properly checked for expiration dates. SDV (single dose vial) medications used. After the completion of the procedure, all disposable equipment used was discarded in the proper designated medical waste containers. Local Anesthesia: Protocol guidelines were followed. The patient was positioned over the fluoroscopy table. The area was prepped in the usual manner. The time-out was completed. The target  area was identified using fluoroscopy. A 12-in long, straight, sterile hemostat was used with fluoroscopic guidance to locate the targets for each level blocked. Once located, the skin was marked with an approved surgical skin marker. Once all sites were marked, the skin (  epidermis, dermis, and hypodermis), as well as deeper tissues (fat, connective tissue and muscle) were infiltrated with a small amount of a short-acting local anesthetic, loaded on a 10cc syringe with a 25G, 1.5-in  Needle. An appropriate amount of time was allowed for local anesthetics to take effect before proceeding to the next step. Local Anesthetic: Lidocaine 2.0% The unused portion of the local anesthetic was discarded in the proper designated containers. Technical explanation of process:  L2 Medial Branch Nerve Block (MBB): The target area for the L2 medial branch is at the junction of the postero-lateral aspect of the superior articular process and the superior, posterior, and medial edge of the transverse process of L3. Under fluoroscopic guidance, a Quincke needle was inserted until contact was made with os over the superior postero-lateral aspect of the pedicular shadow (target area). After negative aspiration for blood, 0.5 mL of the nerve block solution was injected without difficulty or complication. The needle was removed intact. L3 Medial Branch Nerve Block (MBB): The target area for the L3 medial branch is at the junction of the postero-lateral aspect of the superior articular process and the superior, posterior, and medial edge of the transverse process of L4. Under fluoroscopic guidance, a Quincke needle was inserted until contact was made with os over the superior postero-lateral aspect of the pedicular shadow (target area). After negative aspiration for blood, 0.5 mL of the nerve block solution was injected without difficulty or complication. The needle was removed intact. L4 Medial Branch Nerve Block (MBB): The target  area for the L4 medial branch is at the junction of the postero-lateral aspect of the superior articular process and the superior, posterior, and medial edge of the transverse process of L5. Under fluoroscopic guidance, a Quincke needle was inserted until contact was made with os over the superior postero-lateral aspect of the pedicular shadow (target area). After negative aspiration for blood, 0.5 mL of the nerve block solution was injected without difficulty or complication. The needle was removed intact. L5 Medial Branch Nerve Block (MBB): The target area for the L5 medial branch is at the junction of the postero-lateral aspect of the superior articular process and the superior, posterior, and medial edge of the sacral ala. Under fluoroscopic guidance, a Quincke needle was inserted until contact was made with os over the superior postero-lateral aspect of the pedicular shadow (target area). After negative aspiration for blood, 0.5 mL of the nerve block solution was injected without difficulty or complication. The needle was removed intact. S1 Medial Branch Nerve Block (MBB): The target area for the S1 medial branch is at the posterior and inferior 6 o'clock position of the L5-S1 facet joint. Under fluoroscopic guidance, the Quincke needle inserted for the L5 MBB was redirected until contact was made with os over the inferior and postero aspect of the sacrum, at the 6 o' clock position under the L5-S1 facet joint (Target area). After negative aspiration for blood, 0.5 mL of the nerve block solution was injected without difficulty or complication. The needle was removed intact.  Nerve block solution: 0.2% PF-Ropivacaine + Triamcinolone (40 mg/mL) diluted to a final concentration of 4 mg of Triamcinolone/mL of Ropivacaine The unused portion of the solution was discarded in the proper designated containers. Procedural Needles: 22-gauge, 3.5-inch, Quincke needles used for all levels.  Once the entire procedure  was completed, the treated area was cleaned, making sure to leave some of the prepping solution back to take advantage of its long term bactericidal properties.  Illustration of the posterior view of the lumbar spine and the posterior neural structures. Laminae of L2 through S1 are labeled. DPRL5, dorsal primary ramus of L5; DPRS1, dorsal primary ramus of S1; DPR3, dorsal primary ramus of L3; FJ, facet (zygapophyseal) joint L3-L4; I, inferior articular process of L4; LB1, lateral branch of dorsal primary ramus of L1; IAB, inferior articular branches from L3 medial branch (supplies L4-L5 facet joint); IBP, intermediate branch plexus; MB3, medial branch of dorsal primary ramus of L3; NR3, third lumbar nerve root; S, superior articular process of L5; SAB, superior articular branches from L4 (supplies L4-5 facet joint also); TP3, transverse process of L3.  There were no vitals filed for this visit.   Start Time:   hrs. End Time:   hrs.  Imaging Guidance (Spinal):          Type of Imaging Technique: Fluoroscopy Guidance (Spinal) Indication(s): Assistance in needle guidance and placement for procedures requiring needle placement in or near specific anatomical locations not easily accessible without such assistance. Exposure Time: Please see nurses notes. Contrast: None used. Fluoroscopic Guidance: I was personally present during the use of fluoroscopy. "Tunnel Vision Technique" used to obtain the best possible view of the target area. Parallax error corrected before commencing the procedure. "Direction-depth-direction" technique used to introduce the needle under continuous pulsed fluoroscopy. Once target was reached, antero-posterior, oblique, and lateral fluoroscopic projection used confirm needle placement in all planes. Images permanently stored in EMR. Interpretation: No contrast injected. I personally interpreted the imaging intraoperatively. Adequate needle placement confirmed in multiple planes.  Permanent images saved into the patient's record.  Antibiotic Prophylaxis:   Anti-infectives (From admission, onward)   None     Indication(s): None identified  Post-operative Assessment:  Post-procedure Vital Signs:  Pulse/HCG Rate:    Temp:   Resp:   BP:   SpO2:    EBL: None  Complications: No immediate post-treatment complications observed by team, or reported by patient.  Note: The patient tolerated the entire procedure well. A repeat set of vitals were taken after the procedure and the patient was kept under observation following institutional policy, for this type of procedure. Post-procedural neurological assessment was performed, showing return to baseline, prior to discharge. The patient was provided with post-procedure discharge instructions, including a section on how to identify potential problems. Should any problems arise concerning this procedure, the patient was given instructions to immediately contact us, at any time, without hesitation. In any case, we plan to contact the patient by telephone for a follow-up status report regarding this interventional procedure.  Comments:  No additional relevant information.  Plan of Care  Orders:  No orders of the defined types were placed in this encounter.  Chronic Opioid Analgesic:  Tramadol 50 mg, 1 tab PO q 8 hrs (150 mg/day of tramadol) MME/day: 15 mg/day.   Medications ordered for procedure: No orders of the defined types were placed in this encounter.  Medications administered: [name] had no medications administered during this visit.  See the medical record for exact dosing, route, and time of administration.  Follow-up plan:   No follow-ups on file.       Interventional management options:  Considering: Possible bilateral lumbar facet RFA.  Diagnostic right hip joint injection Diagnostic right L3 TFESI Diagnostic right trochanteric bursa injection Diagnostic right SI joint block    Palliative PRN treatment(s): Palliativeright lumbar facet block #11 Palliativeleft lumbar facet block #9 Palliative right L3-4 LESI    Recent Visits Date Type  Provider Dept  05/05/19 Office Visit Milinda Pointer, MD [facility]  Showing recent visits within past 90 days and meeting all other requirements   Today's Visits Date Type Provider Dept  05/18/19 Appointment Milinda Pointer, MD [facility]  Showing today's visits and meeting all other requirements   Future Appointments No visits were found meeting these conditions.  Showing future appointments within next 90 days and meeting all other requirements   Disposition: Discharge home  Discharge Date & Time: 05/18/2019;   hrs.   Primary Care Physician: Olin Hauser, DO Location: [facility] Note by: Gaspar Cola, MD Date: 05/18/2019; Time: 8:08 AM  Disclaimer:  Medicine is not an Chief Strategy Officer. The only guarantee in medicine is that nothing is guaranteed. It is important to note that the decision to proceed with this intervention was based on the information collected from the patient. The Data and conclusions were drawn from the patient's questionnaire, the interview, and the physical examination. Because the information was provided in large part by the patient, it cannot be guaranteed that it has not been purposely or unconsciously manipulated. Every effort has been made to obtain as much relevant data as possible for this evaluation. It is important to note that the conclusions that lead to this procedure are derived in large part from the available data. Always take into account that the treatment will also be dependent on availability of resources and existing treatment guidelines, considered by other Pain Management Practitioners as being common knowledge and practice, at the time of the intervention. For Medico-Legal purposes, it is also important  to point out that variation in procedural techniques and pharmacological choices are the acceptable norm. The indications, contraindications, technique, and results of the above procedure should only be interpreted and judged by a Board-Certified Interventional Pain Specialist with extensive familiarity and expertise in the same exact procedure and technique.

## 2019-05-25 ENCOUNTER — Ambulatory Visit: Payer: Medicare Other | Admitting: Pain Medicine

## 2019-06-01 ENCOUNTER — Ambulatory Visit (HOSPITAL_BASED_OUTPATIENT_CLINIC_OR_DEPARTMENT_OTHER): Payer: Medicare Other | Admitting: Pain Medicine

## 2019-06-01 ENCOUNTER — Encounter: Payer: Self-pay | Admitting: Pain Medicine

## 2019-06-01 ENCOUNTER — Ambulatory Visit
Admission: RE | Admit: 2019-06-01 | Discharge: 2019-06-01 | Disposition: A | Discharge status: Home / Self Care | Payer: Medicare Other | Source: Ambulatory Visit | Attending: Pain Medicine | Admitting: Pain Medicine

## 2019-06-01 ENCOUNTER — Other Ambulatory Visit: Payer: Self-pay

## 2019-06-01 VITALS — BP 176/93 | HR 72 | Temp 98.5°F | Resp 13 | Ht 65.0 in | Wt 132.0 lb

## 2019-06-01 DIAGNOSIS — M47816 Spondylosis without myelopathy or radiculopathy, lumbar region: Secondary | ICD-10-CM | POA: Insufficient documentation

## 2019-06-01 DIAGNOSIS — M47817 Spondylosis without myelopathy or radiculopathy, lumbosacral region: Secondary | ICD-10-CM | POA: Insufficient documentation

## 2019-06-01 DIAGNOSIS — M5137 Other intervertebral disc degeneration, lumbosacral region: Secondary | ICD-10-CM | POA: Diagnosis not present

## 2019-06-01 DIAGNOSIS — G8929 Other chronic pain: Secondary | ICD-10-CM | POA: Diagnosis not present

## 2019-06-01 DIAGNOSIS — M545 Low back pain, unspecified: Secondary | ICD-10-CM

## 2019-06-01 MED ORDER — LIDOCAINE HCL 2 % IJ SOLN
20.0000 mL | Freq: Once | INTRAMUSCULAR | Status: AC
  Administered 2019-06-01: 400 mg
  Filled 2019-06-01: qty 20

## 2019-06-01 MED ORDER — ROPIVACAINE HCL 2 MG/ML IJ SOLN
18.0000 mL | Freq: Once | INTRAMUSCULAR | Status: AC
  Administered 2019-06-01: 18 mL via PERINEURAL
  Filled 2019-06-01: qty 20

## 2019-06-01 MED ORDER — TRIAMCINOLONE ACETONIDE 40 MG/ML IJ SUSP
80.0000 mg | Freq: Once | INTRAMUSCULAR | Status: AC
  Administered 2019-06-01: 80 mg
  Filled 2019-06-01: qty 2

## 2019-06-01 NOTE — Patient Instructions (Signed)

## 2019-06-01 NOTE — Progress Notes (Signed)
Patient's Name: Brittney Simpson  MRN: [id number]  Referring Provider: Nobie Putnam *  DOB: 1928/01/29  PCP: Olin Hauser, DO  DOS: 06/01/2019  Note by: Gaspar Cola, MD  Service setting: Ambulatory outpatient  Specialty: Interventional Pain Management  Patient type: Established  Location: [facility] (AMB) Pain Management Facility  Visit type: Interventional Procedure   Primary Reason for Visit: Interventional Pain Management Treatment. CC: Back Pain (lower)  Procedure:          Anesthesia, Analgesia, Anxiolysis:  Type: Lumbar Facet, Medial Branch Block(s) #R11/L9  Primary Purpose: Palliative Region: Posterolateral Lumbosacral Spine Level: L2, L3, L4, L5, & S1 Medial Branch Level(s). Injecting these levels blocks the L3-4, L4-5, and L5-S1 lumbar facet joints. Laterality: Bilateral  Type: Local Anesthesia Indication(s): Analgesia         Route: Infiltration (Nora/IM) IV Access: Declined Sedation: Declined  Local Anesthetic: Lidocaine 1-2%  Position: Prone   Indications: 1. Lumbar facet syndrome (Bilateral) (R>L)   2. Spondylosis without myelopathy or radiculopathy, lumbosacral region   3. Lumbar facet hypertrophy   4. DDD (degenerative disc disease), lumbosacral   5. Chronic low back pain (Primary Area of Pain) (Bilateral) (R>L)    Pain Score: Pre-procedure: 8 /10 Post-procedure: 2 /10   Pre-op Assessment:  Brittney Simpson is a 85 y.o. (year old), female patient, seen today for interventional treatment. She  has a past surgical history that includes Hernia repair; Hernia repair; Hemorrhoid surgery; and Kyphoplasty (N/A, 01/23/2017). Brittney Simpson has a current medication list which includes the following prescription(s): acetaminophen, amlodipine, lidocaine, brimonidine, clonazepam, cranberry, vitamin d2, furosemide, latanoprost, levothyroxine, lisinopril, metoprolol succinate, omeprazole, timolol, tramadol, trazodone, ciprofloxacin, and  sulfamethoxazole-trimethoprim. Her primarily concern today is the Back Pain (lower)  Initial Vital Signs:  Pulse/HCG Rate: 65  Temp: 98.5 F (36.9 C) Resp: 18 BP: (!) 180/84 SpO2: 98 %  BMI: Estimated body mass index is 21.97 kg/m as calculated from the following:   Height as of this encounter: 5\' 5"  (1.651 m).   Weight as of this encounter: 132 lb (59.9 kg).  Risk Assessment: Allergies: Reviewed. She has No Known Allergies.  Allergy Precautions: None required Coagulopathies: Reviewed. None identified.  Blood-thinner therapy: None at this time Active Infection(s): Reviewed. None identified. Brittney Simpson is afebrile  Site Confirmation: Brittney Simpson was asked to confirm the procedure and laterality before marking the site Procedure checklist: Completed Consent: Before the procedure and under the influence of no sedative(s), amnesic(s), or anxiolytics, the patient was informed of the treatment options, risks and possible complications. To fulfill our ethical and legal obligations, as recommended by the American Medical Association's Code of Ethics, I have informed the patient of my clinical impression; the nature and purpose of the treatment or procedure; the risks, benefits, and possible complications of the intervention; the alternatives, including doing nothing; the risk(s) and benefit(s) of the alternative treatment(s) or procedure(s); and the risk(s) and benefit(s) of doing nothing. The patient was provided information about the general risks and possible complications associated with the procedure. These may include, but are not limited to: failure to achieve desired goals, infection, bleeding, organ or nerve damage, allergic reactions, paralysis, and death. In addition, the patient was informed of those risks and complications associated to Spine-related procedures, such as failure to decrease pain; infection (i.e.: Meningitis, epidural or intraspinal abscess); bleeding (i.e.: epidural  hematoma, subarachnoid hemorrhage, or any other type of intraspinal or peri-dural bleeding); organ or nerve damage (i.e.: Any type of peripheral nerve, nerve root, or spinal  cord injury) with subsequent damage to sensory, motor, and/or autonomic systems, resulting in permanent pain, numbness, and/or weakness of one or several areas of the body; allergic reactions; (i.e.: anaphylactic reaction); and/or death. Furthermore, the patient was informed of those risks and complications associated with the medications. These include, but are not limited to: allergic reactions (i.e.: anaphylactic or anaphylactoid reaction(s)); adrenal axis suppression; blood sugar elevation that in diabetics may result in ketoacidosis or comma; water retention that in patients with history of congestive heart failure may result in shortness of breath, pulmonary edema, and decompensation with resultant heart failure; weight gain; swelling or edema; medication-induced neural toxicity; particulate matter embolism and blood vessel occlusion with resultant organ, and/or nervous system infarction; and/or aseptic necrosis of one or more joints. Finally, the patient was informed that Medicine is not an exact science; therefore, there is also the possibility of unforeseen or unpredictable risks and/or possible complications that may result in a catastrophic outcome. The patient indicated having understood very clearly. We have given the patient no guarantees and we have made no promises. Enough time was given to the patient to ask questions, all of which were answered to the patient's satisfaction. Brittney Simpson has indicated that she wanted to continue with the procedure. Attestation: I, the ordering provider, attest that I have discussed with the patient the benefits, risks, side-effects, alternatives, likelihood of achieving goals, and potential problems during recovery for the procedure that I have provided informed consent. Date   Time:  06/01/2019  9:19 AM  Pre-Procedure Preparation:  Monitoring: As per clinic protocol. Respiration, ETCO2, SpO2, BP, heart rate and rhythm monitor placed and checked for adequate function Safety Precautions: Patient was assessed for positional comfort and pressure points before starting the procedure. Time-out: I initiated and conducted the "Time-out" before starting the procedure, as per protocol. The patient was asked to participate by confirming the accuracy of the "Time Out" information. Verification of the correct person, site, and procedure were performed and confirmed by me, the nursing staff, and the patient. "Time-out" conducted as per Joint Commission's Universal Protocol (UP.01.01.01). Time: 0950  Description of Procedure:          Laterality: Bilateral. The procedure was performed in identical fashion on both sides. Levels:  L2, L3, L4, L5, & S1 Medial Branch Level(s) Area Prepped: Posterior Lumbosacral Region Prepping solution: DuraPrep (Iodine Povacrylex [0.7% available iodine] and Isopropyl Alcohol, 74% w/w) Safety Precautions: Aspiration looking for blood return was conducted prior to all injections. At no point did we inject any substances, as a needle was being advanced. Before injecting, the patient was told to immediately notify me if she was experiencing any new onset of "ringing in the ears, or metallic taste in the mouth". No attempts were made at seeking any paresthesias. Safe injection practices and needle disposal techniques used. Medications properly checked for expiration dates. SDV (single dose vial) medications used. After the completion of the procedure, all disposable equipment used was discarded in the proper designated medical waste containers. Local Anesthesia: Protocol guidelines were followed. The patient was positioned over the fluoroscopy table. The area was prepped in the usual manner. The time-out was completed. The target area was identified using fluoroscopy. A  12-in long, straight, sterile hemostat was used with fluoroscopic guidance to locate the targets for each level blocked. Once located, the skin was marked with an approved surgical skin marker. Once all sites were marked, the skin (epidermis, dermis, and hypodermis), as well as deeper tissues (fat, connective tissue and  muscle) were infiltrated with a small amount of a short-acting local anesthetic, loaded on a 10cc syringe with a 25G, 1.5-in  Needle. An appropriate amount of time was allowed for local anesthetics to take effect before proceeding to the next step. Local Anesthetic: Lidocaine 2.0% The unused portion of the local anesthetic was discarded in the proper designated containers. Technical explanation of process:  L2 Medial Branch Nerve Block (MBB): The target area for the L2 medial branch is at the junction of the postero-lateral aspect of the superior articular process and the superior, posterior, and medial edge of the transverse process of L3. Under fluoroscopic guidance, a Quincke needle was inserted until contact was made with os over the superior postero-lateral aspect of the pedicular shadow (target area). After negative aspiration for blood, 0.5 mL of the nerve block solution was injected without difficulty or complication. The needle was removed intact. L3 Medial Branch Nerve Block (MBB): The target area for the L3 medial branch is at the junction of the postero-lateral aspect of the superior articular process and the superior, posterior, and medial edge of the transverse process of L4. Under fluoroscopic guidance, a Quincke needle was inserted until contact was made with os over the superior postero-lateral aspect of the pedicular shadow (target area). After negative aspiration for blood, 0.5 mL of the nerve block solution was injected without difficulty or complication. The needle was removed intact. L4 Medial Branch Nerve Block (MBB): The target area for the L4 medial branch is at the  junction of the postero-lateral aspect of the superior articular process and the superior, posterior, and medial edge of the transverse process of L5. Under fluoroscopic guidance, a Quincke needle was inserted until contact was made with os over the superior postero-lateral aspect of the pedicular shadow (target area). After negative aspiration for blood, 0.5 mL of the nerve block solution was injected without difficulty or complication. The needle was removed intact. L5 Medial Branch Nerve Block (MBB): The target area for the L5 medial branch is at the junction of the postero-lateral aspect of the superior articular process and the superior, posterior, and medial edge of the sacral ala. Under fluoroscopic guidance, a Quincke needle was inserted until contact was made with os over the superior postero-lateral aspect of the pedicular shadow (target area). After negative aspiration for blood, 0.5 mL of the nerve block solution was injected without difficulty or complication. The needle was removed intact. S1 Medial Branch Nerve Block (MBB): The target area for the S1 medial branch is at the posterior and inferior 6 o'clock position of the L5-S1 facet joint. Under fluoroscopic guidance, the Quincke needle inserted for the L5 MBB was redirected until contact was made with os over the inferior and postero aspect of the sacrum, at the 6 o' clock position under the L5-S1 facet joint (Target area). After negative aspiration for blood, 0.5 mL of the nerve block solution was injected without difficulty or complication. The needle was removed intact.  Nerve block solution: 0.2% PF-Ropivacaine + Triamcinolone (40 mg/mL) diluted to a final concentration of 4 mg of Triamcinolone/mL of Ropivacaine The unused portion of the solution was discarded in the proper designated containers. Procedural Needles: 22-gauge, 3.5-inch, Quincke needles used for all levels.  Once the entire procedure was completed, the treated area was  cleaned, making sure to leave some of the prepping solution back to take advantage of its long term bactericidal properties.   Illustration of the posterior view of the lumbar spine and the posterior neural  structures. Laminae of L2 through S1 are labeled. DPRL5, dorsal primary ramus of L5; DPRS1, dorsal primary ramus of S1; DPR3, dorsal primary ramus of L3; FJ, facet (zygapophyseal) joint L3-L4; I, inferior articular process of L4; LB1, lateral branch of dorsal primary ramus of L1; IAB, inferior articular branches from L3 medial branch (supplies L4-L5 facet joint); IBP, intermediate branch plexus; MB3, medial branch of dorsal primary ramus of L3; NR3, third lumbar nerve root; S, superior articular process of L5; SAB, superior articular branches from L4 (supplies L4-5 facet joint also); TP3, transverse process of L3.  Vitals:   06/01/19 0918 06/01/19 0945 06/01/19 0955 06/01/19 1003  BP: (!) 180/84 (!) 191/89 (!) 188/83 (!) 176/93  Pulse: 65 71 71 72  Resp: 18 16 12 13   Temp: 98.5 F (36.9 C)     TempSrc: Oral     SpO2: 98% 96% 97% 96%  Weight: 132 lb (59.9 kg)     Height: 5\' 5"  (1.651 m)        Start Time: 0950 hrs. End Time: 1000 hrs.  Imaging Guidance (Spinal):          Type of Imaging Technique: Fluoroscopy Guidance (Spinal) Indication(s): Assistance in needle guidance and placement for procedures requiring needle placement in or near specific anatomical locations not easily accessible without such assistance. Exposure Time: Please see nurses notes. Contrast: None used. Fluoroscopic Guidance: I was personally present during the use of fluoroscopy. "Tunnel Vision Technique" used to obtain the best possible view of the target area. Parallax error corrected before commencing the procedure. "Direction-depth-direction" technique used to introduce the needle under continuous pulsed fluoroscopy. Once target was reached, antero-posterior, oblique, and lateral fluoroscopic projection used  confirm needle placement in all planes. Images permanently stored in EMR. Interpretation: No contrast injected. I personally interpreted the imaging intraoperatively. Adequate needle placement confirmed in multiple planes. Permanent images saved into the patient's record.  Antibiotic Prophylaxis:   Anti-infectives (From admission, onward)   None     Indication(s): None identified  Post-operative Assessment:  Post-procedure Vital Signs:  Pulse/HCG Rate: 72  Temp: 98.5 F (36.9 C) Resp: 13 BP: (!) 176/93 SpO2: 96 %  EBL: None  Complications: No immediate post-treatment complications observed by team, or reported by patient.  Note: The patient tolerated the entire procedure well. A repeat set of vitals were taken after the procedure and the patient was kept under observation following institutional policy, for this type of procedure. Post-procedural neurological assessment was performed, showing return to baseline, prior to discharge. The patient was provided with post-procedure discharge instructions, including a section on how to identify potential problems. Should any problems arise concerning this procedure, the patient was given instructions to immediately contact us, at any time, without hesitation. In any case, we plan to contact the patient by telephone for a follow-up status report regarding this interventional procedure.  Comments:  No additional relevant information.  Plan of Care  Orders:  Orders Placed This Encounter  Procedures   LUMBAR FACET(MEDIAL BRANCH NERVE BLOCK) MBNB    Scheduling Instructions:     Procedure: Lumbar facet block (AKA.: Lumbosacral medial branch nerve block)     Side: Bilateral     Level: L3-4, L4-5, & L5-S1 Facets (L2, L3, L4, L5, & S1 Medial Branch Nerves)     Sedation: No Sedation.     Timeframe: Today    Order Specific Question:   Where will this procedure be performed?    Answer:   [facility]  DG PA[facility] C-ARM 1-60 MIN NO  REPORT    Intraoperative interpretation by procedural physician at [facility].    Standing Status:   Standing    Number of Occurrences:   1    Order Specific Question:   Reason for exam:    Answer:   Assistance in needle guidance and placement for procedures requiring needle placement in or near specific anatomical locations not easily accessible without such assistance.   Informed Consent Details: Physician/Practitioner Attestation; Transcribe to consent form and obtain patient signature    Nursing Order: Transcribe to consent form and obtain patient signature. Note: Always confirm laterality of pain with Ms. Rettinger, before procedure. Procedure: Lumbar Facet Block  under fluoroscopic guidance Indication/Reason: Low Back Pain, with our without leg pain, due to Facet Joint Arthralgia (Joint Pain) known as Lumbar Facet Syndrome, secondary to Lumbar, and/or Lumbosacral Spondylosis (Arthritis of the Spine), without myelopathy or radiculopathy (Nerve Damage). Provider Attestation: I, [place] Dossie Arbour, MD, (Pain Management Specialist), the physician/practitioner, attest that I have discussed with the patient the benefits, risks, side effects, alternatives, likelihood of achieving goals and potential problems during recovery for the procedure that I have provided informed consent.   Provide equipment / supplies at bedside    Equipment required: Single use, disposable, "Block Tray"    Standing Status:   Standing    Number of Occurrences:   1    Order Specific Question:   Specify    Answer:   Block Tray   Chronic Opioid Analgesic:  Tramadol 50 mg, 1 tab PO q 8 hrs (150 mg/day of tramadol) MME/day: 15 mg/day.   Medications ordered for procedure: Meds ordered this encounter  Medications   lidocaine (XYLOCAINE) 2 % (with pres) injection 400 mg   ropivacaine (PF) 2 mg/mL (0.2%) (NAROPIN) injection 18 mL   triamcinolone acetonide (KENALOG-40) injection 80 mg   Medications  administered: We administered lidocaine, ropivacaine (PF) 2 mg/mL (0.2%), and triamcinolone acetonide.  See the medical record for exact dosing, route, and time of administration.  Follow-up plan:   Return in about 2 weeks (around 06/15/2019) for (VV), (PP).       Interventional management options:  Considering: Possible bilateral lumbar facet RFA. (Offerred several times. Pt. chooses to hold.) Diagnostic right hip joint injection Diagnostic right L3 TFESI Diagnostic right trochanteric bursa injection Diagnostic right SI joint block   Palliative PRN treatment(s): Palliativeright lumbar facet block #12 Palliativeleft lumbar facet block #10 Palliative right L3-4 LESI    Recent Visits Date Type Provider Dept  05/05/19 Office Visit Milinda Pointer, MD [facility]  Showing recent visits within past 90 days and meeting all other requirements   Today's Visits Date Type Provider Dept  06/01/19 Procedure visit Milinda Pointer, MD [facility]  Showing today's visits and meeting all other requirements   Future Appointments Date Type Provider Dept  06/16/19 Appointment Milinda Pointer, MD [facility]  Showing future appointments within next 90 days and meeting all other requirements   Disposition: Discharge home  Discharge Date & Time: 06/01/2019; 1005 hrs.   Primary Care Physician: Olin Hauser, DO Location: [facility] Note by: Gaspar Cola, MD Date: 06/01/2019; Time: 11:53 AM  Disclaimer:  Medicine is not an Chief Strategy Officer. The only guarantee in medicine is that nothing is guaranteed. It is important to note that the decision to proceed with this intervention was based on the information collected from the patient. The Data and conclusions  were drawn from the patient's questionnaire, the interview, and the physical examination. Because the information was provided in large part by the  patient, it cannot be guaranteed that it has not been purposely or unconsciously manipulated. Every effort has been made to obtain as much relevant data as possible for this evaluation. It is important to note that the conclusions that lead to this procedure are derived in large part from the available data. Always take into account that the treatment will also be dependent on availability of resources and existing treatment guidelines, considered by other Pain Management Practitioners as being common knowledge and practice, at the time of the intervention. For Medico-Legal purposes, it is also important to point out that variation in procedural techniques and pharmacological choices are the acceptable norm. The indications, contraindications, technique, and results of the above procedure should only be interpreted and judged by a Board-Certified Interventional Pain Specialist with extensive familiarity and expertise in the same exact procedure and technique.

## 2019-06-01 NOTE — Progress Notes (Signed)
Safety precautions to be maintained throughout the outpatient stay will include: orient to surroundings, keep bed in low position, maintain call bell within reach at all times, provide assistance with transfer out of bed and ambulation.  

## 2019-06-02 ENCOUNTER — Telehealth: Payer: Self-pay

## 2019-06-02 NOTE — Telephone Encounter (Signed)
Post procedure phone call.  Patient states she is doing well.  

## 2019-06-15 ENCOUNTER — Encounter: Payer: Self-pay | Admitting: Pain Medicine

## 2019-06-15 NOTE — Progress Notes (Signed)
Pain relief after procedure (treated area only): (Questions asked to patient) 1. Starting about 15 minutes after the procedure, and "while the area was still numb" (from the local anesthetics), were you having any of your usual pain "in that area"? (NOT including the discomfort from the needle sticks.) First 1 hour: 50 % better. First 4-6 hours: 50 % better. 3. How much better is your pain now, when compared to before the procedure? Current benefit: 80 % better. 4. Can you move better now? Improvement in ROM (Range of Motion): Yes. 5. Can you do more now? Improvement in function: Yes. 4. Did you have any problems with the procedure? Side-effects/Complications: No.

## 2019-06-16 ENCOUNTER — Other Ambulatory Visit: Payer: Self-pay

## 2019-06-16 ENCOUNTER — Ambulatory Visit: Payer: Medicare Other | Attending: Pain Medicine | Admitting: Pain Medicine

## 2019-06-16 DIAGNOSIS — G894 Chronic pain syndrome: Secondary | ICD-10-CM | POA: Diagnosis not present

## 2019-06-16 DIAGNOSIS — M47816 Spondylosis without myelopathy or radiculopathy, lumbar region: Secondary | ICD-10-CM | POA: Diagnosis not present

## 2019-06-16 DIAGNOSIS — G8929 Other chronic pain: Secondary | ICD-10-CM

## 2019-06-16 DIAGNOSIS — M545 Low back pain: Secondary | ICD-10-CM

## 2019-06-16 NOTE — Progress Notes (Signed)
Pain Management Virtual Encounter Note - Virtual Visit via Telephone Telehealth (real-time audio visits between healthcare provider and patient).   Patient's Phone No. & Preferred Pharmacy:  [phone] (home); [phone] (mobile); (Preferred) [phone] No e-mail address on record  [place], [place] [place] [address] Phone: [phone] Fax: [phone]    Pre-screening note:  Our staff contacted Brittney Simpson and offered her an "in person", "face-to-face" appointment versus a telephone encounter. She indicated preferring the telephone encounter, at this time.   Reason for Virtual Visit: COVID-19*  Social distancing based on CDC and AMA recommendations.   I contacted Brittney Simpson on 06/16/2019 via telephone.      I clearly identified myself as Gaspar Cola, MD. I verified that I was speaking with the correct person using two identifiers (Name: [name], and date of birth: [birth year]).  Advanced Informed Consent I sought verbal advanced consent from [name] for virtual visit interactions. I informed Brittney Simpson of possible security and privacy concerns, risks, and limitations associated with providing "not-in-person" medical evaluation and management services. I also informed Ms. Alarid of the availability of "in-person" appointments. Finally, I informed her that there would be a charge for the virtual visit and that she could be  personally, fully or partially, financially responsible for it. Ms. Sastry expressed understanding and agreed to proceed.   Historic Elements   Brittney Simpson is a 85 y.o. year old, female patient evaluated today after her last encounter by our practice on 06/02/2019. [name]  has a past medical history of Anxiety, Back ache, Hiatal hernia, Hyperlipidemia, Hypertension, Hypothyroid, Osteopenia, Thyroid disease, and UTI (urinary tract infection). She also  has a  past surgical history that includes Hernia repair; Hernia repair; Hemorrhoid surgery; and Kyphoplasty (N/A, 01/23/2017). [name] has a current medication list which includes the following prescription(s): acetaminophen, amlodipine, lidocaine, brimonidine, clonazepam, cranberry, vitamin d2, furosemide, latanoprost, levothyroxine, lisinopril, metoprolol succinate, omeprazole, sulfamethoxazole-trimethoprim, timolol, tramadol, and trazodone. She  reports that she has never smoked. She has never used smokeless tobacco. She reports that she does not drink alcohol or use drugs. [name] has No Known Allergies.   HPI  Today, she is being contacted for a post-procedure assessment.  Today I spoke to the patient and her daughter and Brittney Simpson indicates that she is still doing really well.  Once again, she attained 100% relief of the pain with the palliative bilateral lumbar facet block and she indicated that she will give [place] a call when the pain comes back to have the shot repeated.  Otherwise, she is doing well and she indicates that the only pain that she has is right over the tailbone area.  Today I mentioned that if it becomes a problem we can do a caudal epidural steroid injection to see if that would help that tailbone pain.  Post-Procedure Evaluation  Procedure: Palliative bilateral lumbar facet block #R11/L9 under fluoroscopic guidance and IV sedation Pre-procedure pain level:  8/10 Post-procedure: 2/10 Limited initial benefit, possibly due to rapid discharge after no sedation procedure, without enough time to allow full onset of block.  Sedation: None.  [name], RN  06/15/2019  1:26 PM  Sign when Signing Visit Pain relief after procedure (treated area only): (Questions asked to patient) 1. Starting about 15 minutes after the procedure, and "while the area was still numb" (from the local anesthetics), were you having any of your usual  pain "in that area"? (NOT including the discomfort  from the needle sticks.) First 1 hour: 50 % better. First 4-6 hours: 50 % better. 3. How much better is your pain now, when compared to before the procedure? Current benefit: 80 % better. 4. Can you move better now? Improvement in ROM (Range of Motion): Yes. 5. Can you do more now? Improvement in function: Yes. 4. Did you have any problems with the procedure? Side-effects/Complications: No.   Current benefits: Defined as benefit that persist at this time.   Analgesia:  100% better.  She refers currently having absolutely no low back pain except for the discomfort in her tailbone. Function: Brittney Simpson reports improvement in function ROM: Brittney Simpson reports improvement in ROM  Pharmacotherapy Assessment  Analgesic: Tramadol 50 mg, 1 tab PO q 8 hrs (150 mg/day of tramadol) (should have enough to last until 11/01/2019) MME/day: 15 mg/day.   Monitoring: Pharmacotherapy: No side-effects or adverse reactions reported.  PMP: PDMP reviewed during this encounter.       Compliance: No problems identified. Effectiveness: Clinically acceptable. Plan: Refer to "POC".  UDS:  Summary  Date Value Ref Range Status  10/28/2017 FINAL  Final    Comment:    ==================================================================== TOXASSURE SELECT 13 (MW) ==================================================================== Test                             Result       Flag       Units Drug Present and Declared for Prescription Verification   Tramadol                       >20000       EXPECTED   ng/mg creat   O-Desmethyltramadol            >20000       EXPECTED   ng/mg creat   N-Desmethyltramadol            8244         EXPECTED   ng/mg creat    Source of tramadol is a prescription medication.    O-desmethyltramadol and N-desmethyltramadol are expected    metabolites of tramadol. Drug Present not Declared for Prescription Verification   7-aminoclonazepam              248          UNEXPECTED  ng/mg creat    7-aminoclonazepam is an expected metabolite of clonazepam. Source    of clonazepam is a scheduled prescription medication. ==================================================================== Test                      Result    Flag   Units      Ref Range   Creatinine              25               mg/dL      >=20 ==================================================================== Declared Medications:  The flagging and interpretation on this report are based on the  following declared medications.  Unexpected results may arise from  inaccuracies in the declared medications.  **Note: The testing scope of this panel includes these medications:  Tramadol  **Note: The testing scope of this panel does not include following  reported medications:  Acetaminophen  Amlodipine  Brimonidine Tartrate  Cranberry Extract  Furosemide  Latanoprost  Levothyroxine  Lidocaine  Lisinopril  Metoprolol  Omeprazole  Timolol  Trazodone  Vitamin D2 ==================================================================== For clinical consultation, please call [phone]. ====================================================================    Laboratory Chemistry Profile (12 mo)  Renal: 05/10/2019: BUN 11; BUN/Creatinine Ratio 11; Creat 0.97  Lab Results  Component Value Date   GFRAA 60 05/10/2019   GFRNONAA 52 (L) 05/10/2019   Hepatic: No results found for requested labs within last 8760 hours. Lab Results  Component Value Date   AST 12 05/10/2019   ALT 9 05/10/2019   Other: No results found for requested labs within last 8760 hours. Note: Above Lab results reviewed.  Imaging  Last 90 days:  Dg Pain Clinic C-arm 1-60 Min No Report  Result Date: 06/01/2019 Fluoro was used, but no Radiologist interpretation will be provided. Please refer to "NOTES" tab for provider progress note.  Assessment  The primary encounter diagnosis was Lumbar facet syndrome (Bilateral) (R>L).  Diagnoses of Chronic low back pain (Primary Area of Pain) (Bilateral) (R>L) and Chronic pain syndrome were also pertinent to this visit.  Plan of Care  I have discontinued Kismet Reeder. Finnan's ciprofloxacin. I am also having her maintain her Vitamin D2, brimonidine, latanoprost, timolol, acetaminophen, ASPERCREME LIDOCAINE EX, Cranberry, omeprazole, levothyroxine, traZODone, sulfamethoxazole-trimethoprim, amLODipine, traMADol, furosemide, clonazePAM, metoprolol succinate, and lisinopril.  Pharmacotherapy (Medications Ordered): No orders of the defined types were placed in this encounter.  Orders:  No orders of the defined types were placed in this encounter.  Follow-up plan:   Return for PRN Procedure(s): (B) L-FCT BLK.      Interventional management options:  Considering: Possible bilateral lumbar facet RFA. (Offerred several times. Pt. chooses to hold.) Diagnostic right hip joint injection Diagnostic right L3 TFESI Diagnostic right trochanteric bursa injection Diagnostic right SI joint block Diagnostic midline caudal ESI (for the tailbone pain)   Palliative PRN treatment(s): Palliativeright lumbar facet block #12 Palliativeleft lumbar facet block #10 Palliative right L3-4 LESI    Recent Visits Date Type Provider Dept  06/01/19 Procedure visit Milinda Pointer, MD [facility]  05/05/19 Office Visit Milinda Pointer, MD [facility]  Showing recent visits within past 90 days and meeting all other requirements   Today's Visits Date Type Provider Dept  06/16/19 Telemedicine Milinda Pointer, MD [facility]  Showing today's visits and meeting all other requirements   Future Appointments No visits were found meeting these conditions.  Showing future appointments within next 90 days and meeting all other requirements   I discussed the assessment and treatment plan with the patient. The patient was provided an opportunity to ask  questions and all were answered. The patient agreed with the plan and demonstrated an understanding of the instructions.  Patient advised to call back or seek an in-person evaluation if the symptoms or condition worsens.  Total duration of non-face-to-face encounter: 12 minutes.  Note by: Gaspar Cola, MD Date: 06/16/2019; Time: 3:14 PM  Note: This dictation was prepared with Dragon dictation. Any transcriptional errors that may result from this process are unintentional.  Disclaimer:  * Given the special circumstances of the COVID-19 pandemic, the federal government has announced that the Office for Civil Rights (OCR) will exercise its enforcement discretion and will not impose penalties on physicians using telehealth in the event of noncompliance with regulatory requirements under the [place] (HIPAA) in connection with the good faith provision of telehealth during the XX123456 national public health emergency. ([place])

## 2019-06-21 ENCOUNTER — Ambulatory Visit: Payer: Medicare Other | Admitting: Pain Medicine

## 2019-07-09 ENCOUNTER — Other Ambulatory Visit: Payer: Self-pay | Admitting: Family Medicine

## 2019-07-09 DIAGNOSIS — F419 Anxiety disorder, unspecified: Secondary | ICD-10-CM

## 2019-07-09 DIAGNOSIS — K219 Gastro-esophageal reflux disease without esophagitis: Secondary | ICD-10-CM

## 2019-07-09 NOTE — Telephone Encounter (Signed)
Should have another refill on her klonopin at pharmacy (2 refills given in October) Should not be due for a refill on that until January.

## 2019-07-28 DIAGNOSIS — E871 Hypo-osmolality and hyponatremia: Secondary | ICD-10-CM | POA: Insufficient documentation

## 2019-07-28 DIAGNOSIS — R600 Localized edema: Secondary | ICD-10-CM | POA: Insufficient documentation

## 2019-08-05 ENCOUNTER — Other Ambulatory Visit: Payer: Self-pay | Admitting: Family Medicine

## 2019-08-05 DIAGNOSIS — F419 Anxiety disorder, unspecified: Secondary | ICD-10-CM

## 2019-08-12 NOTE — Telephone Encounter (Signed)
error 

## 2019-08-13 DIAGNOSIS — D485 Neoplasm of uncertain behavior of skin: Secondary | ICD-10-CM | POA: Diagnosis not present

## 2019-08-13 DIAGNOSIS — D045 Carcinoma in situ of skin of trunk: Secondary | ICD-10-CM | POA: Diagnosis not present

## 2019-08-13 DIAGNOSIS — L570 Actinic keratosis: Secondary | ICD-10-CM | POA: Diagnosis not present

## 2019-08-23 DIAGNOSIS — H401133 Primary open-angle glaucoma, bilateral, severe stage: Secondary | ICD-10-CM | POA: Diagnosis not present

## 2019-08-27 ENCOUNTER — Other Ambulatory Visit: Payer: Self-pay | Admitting: Family Medicine

## 2019-08-27 DIAGNOSIS — F5101 Primary insomnia: Secondary | ICD-10-CM

## 2019-09-01 ENCOUNTER — Other Ambulatory Visit: Payer: Self-pay | Admitting: Family Medicine

## 2019-09-01 DIAGNOSIS — K219 Gastro-esophageal reflux disease without esophagitis: Secondary | ICD-10-CM

## 2019-09-03 ENCOUNTER — Other Ambulatory Visit: Payer: Self-pay | Admitting: Family Medicine

## 2019-09-03 DIAGNOSIS — K219 Gastro-esophageal reflux disease without esophagitis: Secondary | ICD-10-CM

## 2019-09-07 ENCOUNTER — Other Ambulatory Visit: Payer: Self-pay

## 2019-09-07 ENCOUNTER — Ambulatory Visit (HOSPITAL_BASED_OUTPATIENT_CLINIC_OR_DEPARTMENT_OTHER): Payer: Medicare Other | Admitting: Pain Medicine

## 2019-09-07 ENCOUNTER — Ambulatory Visit
Admission: RE | Admit: 2019-09-07 | Discharge: 2019-09-07 | Disposition: A | Discharge status: Home / Self Care | Payer: Medicare Other | Source: Ambulatory Visit | Attending: Pain Medicine | Admitting: Pain Medicine

## 2019-09-07 ENCOUNTER — Encounter: Payer: Self-pay | Admitting: Pain Medicine

## 2019-09-07 VITALS — BP 185/88 | HR 69 | Temp 97.7°F | Resp 13 | Ht 65.0 in | Wt 132.0 lb

## 2019-09-07 DIAGNOSIS — M545 Low back pain: Secondary | ICD-10-CM | POA: Diagnosis not present

## 2019-09-07 DIAGNOSIS — M47817 Spondylosis without myelopathy or radiculopathy, lumbosacral region: Secondary | ICD-10-CM

## 2019-09-07 DIAGNOSIS — M47816 Spondylosis without myelopathy or radiculopathy, lumbar region: Secondary | ICD-10-CM | POA: Insufficient documentation

## 2019-09-07 DIAGNOSIS — G8929 Other chronic pain: Secondary | ICD-10-CM | POA: Diagnosis not present

## 2019-09-07 DIAGNOSIS — M5137 Other intervertebral disc degeneration, lumbosacral region: Secondary | ICD-10-CM | POA: Diagnosis not present

## 2019-09-07 MED ORDER — TRIAMCINOLONE ACETONIDE 40 MG/ML IJ SUSP
INTRAMUSCULAR | Status: AC
  Filled 2019-09-07: qty 1

## 2019-09-07 MED ORDER — LIDOCAINE HCL 2 % IJ SOLN
20.0000 mL | Freq: Once | INTRAMUSCULAR | Status: AC
  Administered 2019-09-07: 400 mg

## 2019-09-07 MED ORDER — ROPIVACAINE HCL 2 MG/ML IJ SOLN
INTRAMUSCULAR | Status: AC
  Filled 2019-09-07: qty 20

## 2019-09-07 MED ORDER — TRIAMCINOLONE ACETONIDE 40 MG/ML IJ SUSP
80.0000 mg | Freq: Once | INTRAMUSCULAR | Status: AC
  Administered 2019-09-07: 40 mg

## 2019-09-07 MED ORDER — LIDOCAINE HCL 2 % IJ SOLN
INTRAMUSCULAR | Status: AC
  Filled 2019-09-07: qty 20

## 2019-09-07 MED ORDER — ROPIVACAINE HCL 2 MG/ML IJ SOLN
18.0000 mL | Freq: Once | INTRAMUSCULAR | Status: AC
  Administered 2019-09-07: 10 mL via PERINEURAL

## 2019-09-07 NOTE — Patient Instructions (Signed)

## 2019-09-07 NOTE — Progress Notes (Signed)
PROVIDER NOTE: Information contained herein reflects review and annotations entered in association with encounter. Interpretation of such information and data should be left to medically-trained personnel. Information provided to patient can be located elsewhere in the medical record under "Patient Instructions". Document created using STT-dictation technology, any transcriptional errors that may result from process are unintentional.    Patient: Brittney Simpson  Service Category: Procedure  Provider: Gaspar Cola, MD  DOB: May 17, 1928  DOS: 09/07/2019  Location: [facility]  MRN: [id number]  Setting: Ambulatory - outpatient  Referring Provider: Milinda Pointer, MD  Type: Established Patient  Specialty: Interventional Pain Management  PCP: Olin Hauser, DO   Primary Reason for Visit: Interventional Pain Management Treatment. CC: Back Pain (lumbar bilateral ) and Knee Pain (right)  Procedure:          Anesthesia, Analgesia, Anxiolysis:  Type: Lumbar Facet, Medial Branch Block(s)  #R12/L10  Primary Purpose: Diagnostic Region: Posterolateral Lumbosacral Spine Level: L2, L3, L4, L5, & S1 Medial Branch Level(s). Injecting these levels blocks the L3-4, L4-5, and L5-S1 lumbar facet joints. Laterality: Bilateral  Type: Local Anesthesia Indication(s): Analgesia         Route: Infiltration (/IM) IV Access: Declined Sedation: Declined  Local Anesthetic: Lidocaine 1-2%  Position: Prone   Indications: 1. Lumbar facet syndrome (Bilateral) (R>L)   2. Lumbar facet hypertrophy   3. Spondylosis without myelopathy or radiculopathy, lumbosacral region   4. Chronic low back pain (Primary Area of Pain) (Bilateral) (R>L)   5. Facet syndrome, lumbar   6. Facet hypertrophy of lumbar region   7. Chronic bilateral low back pain without sciatica    Pain Score: Pre-procedure: 8 /10 Post-procedure: 0-No pain/10   Pre-op Assessment:  Brittney Simpson is a 86 y.o. (year  old), female patient, seen today for interventional treatment. She  has a past surgical history that includes Hernia repair; Hernia repair; Hemorrhoid surgery; and Kyphoplasty (N/A, 01/23/2017). Brittney Simpson has a current medication list which includes the following prescription(s): acetaminophen, amlodipine, brimonidine, clonazepam, cranberry, vitamin d2, furosemide, latanoprost, levothyroxine, lisinopril, metoprolol succinate, omeprazole, sulfamethoxazole-trimethoprim, timolol, tramadol, trazodone, and lidocaine. Her primarily concern today is the Back Pain (lumbar bilateral ) and Knee Pain (right)  Initial Vital Signs:  Pulse/HCG Rate: 69ECG Heart Rate: 73 Temp: 97.7 F (36.5 C) Resp: 16 BP: (!) 167/80 SpO2: 99 %  BMI: Estimated body mass index is 21.97 kg/m as calculated from the following:   Height as of this encounter: 5\' 5"  (1.651 m).   Weight as of this encounter: 132 lb (59.9 kg).  Risk Assessment: Allergies: Reviewed. She has No Known Allergies.  Allergy Precautions: None required Coagulopathies: Reviewed. None identified.  Blood-thinner therapy: None at this time Active Infection(s): Reviewed. None identified. Brittney Simpson is afebrile  Site Confirmation: Brittney Simpson was asked to confirm the procedure and laterality before marking the site Procedure checklist: Completed Consent: Before the procedure and under the influence of no sedative(s), amnesic(s), or anxiolytics, the patient was informed of the treatment options, risks and possible complications. To fulfill our ethical and legal obligations, as recommended by the American Medical Association's Code of Ethics, I have informed the patient of my clinical impression; the nature and purpose of the treatment or procedure; the risks, benefits, and possible complications of the intervention; the alternatives, including doing nothing; the risk(s) and benefit(s) of the alternative treatment(s) or procedure(s); and the risk(s) and  benefit(s) of doing nothing. The patient was provided information about the general risks and possible complications  associated with the procedure. These may include, but are not limited to: failure to achieve desired goals, infection, bleeding, organ or nerve damage, allergic reactions, paralysis, and death. In addition, the patient was informed of those risks and complications associated to Spine-related procedures, such as failure to decrease pain; infection (i.e.: Meningitis, epidural or intraspinal abscess); bleeding (i.e.: epidural hematoma, subarachnoid hemorrhage, or any other type of intraspinal or peri-dural bleeding); organ or nerve damage (i.e.: Any type of peripheral nerve, nerve root, or spinal cord injury) with subsequent damage to sensory, motor, and/or autonomic systems, resulting in permanent pain, numbness, and/or weakness of one or several areas of the body; allergic reactions; (i.e.: anaphylactic reaction); and/or death. Furthermore, the patient was informed of those risks and complications associated with the medications. These include, but are not limited to: allergic reactions (i.e.: anaphylactic or anaphylactoid reaction(s)); adrenal axis suppression; blood sugar elevation that in diabetics may result in ketoacidosis or comma; water retention that in patients with history of congestive heart failure may result in shortness of breath, pulmonary edema, and decompensation with resultant heart failure; weight gain; swelling or edema; medication-induced neural toxicity; particulate matter embolism and blood vessel occlusion with resultant organ, and/or nervous system infarction; and/or aseptic necrosis of one or more joints. Finally, the patient was informed that Medicine is not an exact science; therefore, there is also the possibility of unforeseen or unpredictable risks and/or possible complications that may result in a catastrophic outcome. The patient indicated having understood very  clearly. We have given the patient no guarantees and we have made no promises. Enough time was given to the patient to ask questions, all of which were answered to the patient's satisfaction. Brittney Simpson has indicated that she wanted to continue with the procedure. Attestation: I, the ordering provider, attest that I have discussed with the patient the benefits, risks, side-effects, alternatives, likelihood of achieving goals, and potential problems during recovery for the procedure that I have provided informed consent. Date  Time: 09/07/2019  8:30 AM  Pre-Procedure Preparation:  Monitoring: As per clinic protocol. Respiration, ETCO2, SpO2, BP, heart rate and rhythm monitor placed and checked for adequate function Safety Precautions: Patient was assessed for positional comfort and pressure points before starting the procedure. Time-out: I initiated and conducted the "Time-out" before starting the procedure, as per protocol. The patient was asked to participate by confirming the accuracy of the "Time Out" information. Verification of the correct person, site, and procedure were performed and confirmed by me, the nursing staff, and the patient. "Time-out" conducted as per Joint Commission's Universal Protocol (UP.01.01.01). Time: 0904  Description of Procedure:          Laterality: Bilateral. The procedure was performed in identical fashion on both sides. Levels:  L2, L3, L4, L5, & S1 Medial Branch Level(s) Area Prepped: Posterior Lumbosacral Region Prepping solution: DuraPrep (Iodine Povacrylex [0.7% available iodine] and Isopropyl Alcohol, 74% w/w) Safety Precautions: Aspiration looking for blood return was conducted prior to all injections. At no point did we inject any substances, as a needle was being advanced. Before injecting, the patient was told to immediately notify me if she was experiencing any new onset of "ringing in the ears, or metallic taste in the mouth". No attempts were made at  seeking any paresthesias. Safe injection practices and needle disposal techniques used. Medications properly checked for expiration dates. SDV (single dose vial) medications used. After the completion of the procedure, all disposable equipment used was discarded in the proper designated medical waste containers.  Local Anesthesia: Protocol guidelines were followed. The patient was positioned over the fluoroscopy table. The area was prepped in the usual manner. The time-out was completed. The target area was identified using fluoroscopy. A 12-in long, straight, sterile hemostat was used with fluoroscopic guidance to locate the targets for each level blocked. Once located, the skin was marked with an approved surgical skin marker. Once all sites were marked, the skin (epidermis, dermis, and hypodermis), as well as deeper tissues (fat, connective tissue and muscle) were infiltrated with a small amount of a short-acting local anesthetic, loaded on a 10cc syringe with a 25G, 1.5-in  Needle. An appropriate amount of time was allowed for local anesthetics to take effect before proceeding to the next step. Local Anesthetic: Lidocaine 2.0% The unused portion of the local anesthetic was discarded in the proper designated containers. Technical explanation of process:  L2 Medial Branch Nerve Block (MBB): The target area for the L2 medial branch is at the junction of the postero-lateral aspect of the superior articular process and the superior, posterior, and medial edge of the transverse process of L3. Under fluoroscopic guidance, a Quincke needle was inserted until contact was made with os over the superior postero-lateral aspect of the pedicular shadow (target area). After negative aspiration for blood, 0.5 mL of the nerve block solution was injected without difficulty or complication. The needle was removed intact. L3 Medial Branch Nerve Block (MBB): The target area for the L3 medial branch is at the junction of the  postero-lateral aspect of the superior articular process and the superior, posterior, and medial edge of the transverse process of L4. Under fluoroscopic guidance, a Quincke needle was inserted until contact was made with os over the superior postero-lateral aspect of the pedicular shadow (target area). After negative aspiration for blood, 0.5 mL of the nerve block solution was injected without difficulty or complication. The needle was removed intact. L4 Medial Branch Nerve Block (MBB): The target area for the L4 medial branch is at the junction of the postero-lateral aspect of the superior articular process and the superior, posterior, and medial edge of the transverse process of L5. Under fluoroscopic guidance, a Quincke needle was inserted until contact was made with os over the superior postero-lateral aspect of the pedicular shadow (target area). After negative aspiration for blood, 0.5 mL of the nerve block solution was injected without difficulty or complication. The needle was removed intact. L5 Medial Branch Nerve Block (MBB): The target area for the L5 medial branch is at the junction of the postero-lateral aspect of the superior articular process and the superior, posterior, and medial edge of the sacral ala. Under fluoroscopic guidance, a Quincke needle was inserted until contact was made with os over the superior postero-lateral aspect of the pedicular shadow (target area). After negative aspiration for blood, 0.5 mL of the nerve block solution was injected without difficulty or complication. The needle was removed intact. S1 Medial Branch Nerve Block (MBB): The target area for the S1 medial branch is at the posterior and inferior 6 o'clock position of the L5-S1 facet joint. Under fluoroscopic guidance, the Quincke needle inserted for the L5 MBB was redirected until contact was made with os over the inferior and postero aspect of the sacrum, at the 6 o' clock position under the L5-S1 facet joint  (Target area). After negative aspiration for blood, 0.5 mL of the nerve block solution was injected without difficulty or complication. The needle was removed intact.  Nerve block solution: 0.2% PF-Ropivacaine +  Triamcinolone (40 mg/mL) diluted to a final concentration of 4 mg of Triamcinolone/mL of Ropivacaine The unused portion of the solution was discarded in the proper designated containers. Procedural Needles: 22-gauge, 3.5-inch, Quincke needles used for all levels.  Once the entire procedure was completed, the treated area was cleaned, making sure to leave some of the prepping solution back to take advantage of its long term bactericidal properties.   Illustration of the posterior view of the lumbar spine and the posterior neural structures. Laminae of L2 through S1 are labeled. DPRL5, dorsal primary ramus of L5; DPRS1, dorsal primary ramus of S1; DPR3, dorsal primary ramus of L3; FJ, facet (zygapophyseal) joint L3-L4; I, inferior articular process of L4; LB1, lateral branch of dorsal primary ramus of L1; IAB, inferior articular branches from L3 medial branch (supplies L4-L5 facet joint); IBP, intermediate branch plexus; MB3, medial branch of dorsal primary ramus of L3; NR3, third lumbar nerve root; S, superior articular process of L5; SAB, superior articular branches from L4 (supplies L4-5 facet joint also); TP3, transverse process of L3.  Vitals:   09/07/19 0901 09/07/19 0906 09/07/19 0910 09/07/19 0916  BP: (!) 179/95 (!) 182/90 (!) 187/85 (!) 185/88  Pulse:      Resp: 12 12 13 13   Temp:      TempSrc:      SpO2: 96% 96% 95% 96%  Weight:      Height:         Start Time: 0904 hrs. End Time: 0912 hrs.  Imaging Guidance (Spinal):          Type of Imaging Technique: Fluoroscopy Guidance (Spinal) Indication(s): Assistance in needle guidance and placement for procedures requiring needle placement in or near specific anatomical locations not easily accessible without such  assistance. Exposure Time: Please see nurses notes. Contrast: None used. Fluoroscopic Guidance: I was personally present during the use of fluoroscopy. "Tunnel Vision Technique" used to obtain the best possible view of the target area. Parallax error corrected before commencing the procedure. "Direction-depth-direction" technique used to introduce the needle under continuous pulsed fluoroscopy. Once target was reached, antero-posterior, oblique, and lateral fluoroscopic projection used confirm needle placement in all planes. Images permanently stored in EMR. Interpretation: No contrast injected. I personally interpreted the imaging intraoperatively. Adequate needle placement confirmed in multiple planes. Permanent images saved into the patient's record.  Antibiotic Prophylaxis:   Anti-infectives (From admission, onward)   None     Indication(s): None identified  Post-operative Assessment:  Post-procedure Vital Signs:  Pulse/HCG Rate: 6972 Temp: 97.7 F (36.5 C) Resp: 13 BP: (!) 185/88 SpO2: 96 %  EBL: None  Complications: No immediate post-treatment complications observed by team, or reported by patient.  Note: The patient tolerated the entire procedure well. A repeat set of vitals were taken after the procedure and the patient was kept under observation following institutional policy, for this type of procedure. Post-procedural neurological assessment was performed, showing return to baseline, prior to discharge. The patient was provided with post-procedure discharge instructions, including a section on how to identify potential problems. Should any problems arise concerning this procedure, the patient was given instructions to immediately contact us, at any time, without hesitation. In any case, we plan to contact the patient by telephone for a follow-up status report regarding this interventional procedure.  Comments:  No additional relevant information.  Plan of Care  Orders:   Orders Placed This Encounter  Procedures  . LUMBAR FACET(MEDIAL BRANCH NERVE BLOCK) MBNB    Scheduling Instructions:  Procedure: Lumbar facet block (AKA.: Lumbosacral medial branch nerve block)     Side: Bilateral     Level: L3-4, L4-5, & L5-S1 Facets (L2, L3, L4, L5, & S1 Medial Branch Nerves)     Sedation: Patient's choice.     Timeframe: Today    Order Specific Question:   Where will this procedure be performed?    Answer:   [facility]  . DG PA[facility] C-ARM 1-60 MIN NO REPORT    Intraoperative interpretation by procedural physician at [facility].    Standing Status:   Standing    Number of Occurrences:   1    Order Specific Question:   Reason for exam:    Answer:   Assistance in needle guidance and placement for procedures requiring needle placement in or near specific anatomical locations not easily accessible without such assistance.  . Informed Consent Details: Physician/Practitioner Attestation; Transcribe to consent form and obtain patient signature    Nursing Order: Transcribe to consent form and obtain patient signature. Note: Always confirm laterality of pain with Ms. Driscoll, before procedure. Procedure: Lumbar Facet Block  under fluoroscopic guidance Indication/Reason: Low Back Pain, with our without leg pain, due to Facet Joint Arthralgia (Joint Pain) known as Lumbar Facet Syndrome, secondary to Lumbar, and/or Lumbosacral Spondylosis (Arthritis of the Spine), without myelopathy or radiculopathy (Nerve Damage). Provider Attestation: I, McCord Dossie Arbour, MD, (Pain Management Specialist), the physician/practitioner, attest that I have discussed with the patient the benefits, risks, side effects, alternatives, likelihood of achieving goals and potential problems during recovery for the procedure that I have provided informed consent.  . Provide equipment / supplies at bedside    Equipment required: Single use, disposable, "Block Tray"    Standing  Status:   Standing    Number of Occurrences:   1    Order Specific Question:   Specify    Answer:   Block Tray   Chronic Opioid Analgesic:  Tramadol 50 mg, 1 tab PO q 8 hrs (150 mg/day of tramadol) (should have enough to last until 11/01/2019) MME/day: 15 mg/day.   Medications ordered for procedure: Meds ordered this encounter  Medications  . lidocaine (XYLOCAINE) 2 % (with pres) injection 400 mg  . ropivacaine (PF) 2 mg/mL (0.2%) (NAROPIN) injection 18 mL  . triamcinolone acetonide (KENALOG-40) injection 80 mg   Medications administered: We administered lidocaine, ropivacaine (PF) 2 mg/mL (0.2%), and triamcinolone acetonide.  See the medical record for exact dosing, route, and time of administration.  Follow-up plan:   Return in about 2 weeks (around 09/21/2019) for (VV), (PP).       Interventional management options:  Considering: Possible bilateral lumbar facet RFA. (Offerred several times. Pt. chooses to hold.) Diagnostic right hip joint injection Diagnostic right L3 TFESI Diagnostic right trochanteric bursa injection Diagnostic right SI joint block Diagnostic midline caudal ESI (for the tailbone pain)   Palliative PRN treatment(s): Palliativeright lumbar facet block #12 Palliativeleft lumbar facet block #10 Palliative right L3-4 LESI     Recent Visits Date Type Provider Dept  06/16/19 Telemedicine Milinda Pointer, MD [facility]  Showing recent visits within past 90 days and meeting all other requirements   Today's Visits Date Type Provider Dept  09/07/19 Procedure visit Milinda Pointer, MD [facility]  Showing today's visits and meeting all other requirements   Future Appointments Date Type Provider Dept  09/22/19 Appointment Milinda Pointer, [facility]  10/27/19 Appointment Milinda Pointer, MD [facility]  Showing future appointments within next 90 days and meeting all other requirements    Disposition: Discharge home  Discharge (Date  Time): 09/07/2019; 0923 hrs.   Primary Care Physician: Olin Hauser, DO Location: [facility] Note by: Gaspar Cola, MD Date: 09/07/2019; Time: 10:18 AM  Disclaimer:  Medicine is not an Chief Strategy Officer. The only guarantee in medicine is that nothing is guaranteed. It is important to note that the decision to proceed with this intervention was based on the information collected from the patient. The Data and conclusions were drawn from the patient's questionnaire, the interview, and the physical examination. Because the information was provided in large part by the patient, it cannot be guaranteed that it has not been purposely or unconsciously manipulated. Every effort has been made to obtain as much relevant data as possible for this evaluation. It is important to note that the conclusions that lead to this procedure are derived in large part from the available data. Always take into account that the treatment will also be dependent on availability of resources and existing treatment guidelines, considered by other Pain Management Practitioners as being common knowledge and practice, at the time of the intervention. For Medico-Legal purposes, it is also important to point out that variation in procedural techniques and pharmacological choices are the acceptable norm. The indications, contraindications, technique, and results of the above procedure should only be interpreted and judged by a Board-Certified Interventional Pain Specialist with extensive familiarity and expertise in the same exact procedure and technique.

## 2019-09-08 ENCOUNTER — Telehealth: Payer: Self-pay | Admitting: *Deleted

## 2019-09-08 NOTE — Telephone Encounter (Signed)
Spoke with Arbie Cookey, patient's daughter and she said she spoke with her mom on last evening around 2000 and she reported that she was doing well and the shot was already helping her pain.  Arbie Cookey will be in touch with her and let us know if they need anything.

## 2019-09-17 DIAGNOSIS — D485 Neoplasm of uncertain behavior of skin: Secondary | ICD-10-CM | POA: Diagnosis not present

## 2019-09-17 DIAGNOSIS — D045 Carcinoma in situ of skin of trunk: Secondary | ICD-10-CM | POA: Diagnosis not present

## 2019-09-17 DIAGNOSIS — L570 Actinic keratosis: Secondary | ICD-10-CM | POA: Diagnosis not present

## 2019-09-21 ENCOUNTER — Other Ambulatory Visit (INDEPENDENT_AMBULATORY_CARE_PROVIDER_SITE_OTHER): Payer: Medicare Other

## 2019-09-21 ENCOUNTER — Ambulatory Visit (INDEPENDENT_AMBULATORY_CARE_PROVIDER_SITE_OTHER): Payer: Medicare Other | Admitting: Vascular Surgery

## 2019-09-22 ENCOUNTER — Telehealth: Payer: Medicare Other | Admitting: Pain Medicine

## 2019-09-23 ENCOUNTER — Other Ambulatory Visit: Payer: Self-pay | Admitting: Family Medicine

## 2019-09-23 DIAGNOSIS — E034 Atrophy of thyroid (acquired): Secondary | ICD-10-CM

## 2019-09-28 ENCOUNTER — Encounter: Payer: Self-pay | Admitting: Pain Medicine

## 2019-09-28 NOTE — Progress Notes (Deleted)
Patient: Brittney Simpson  Service Category: E/M  Provider: Gaspar Cola, MD  DOB: Dec 13, 1927  DOS: 09/29/2019  Location: Office  MRN: [id number]  Setting: Ambulatory outpatient  Referring Provider: Nobie Putnam *  Type: Established Patient  Specialty: Interventional Pain Management  PCP: Olin Hauser, DO  Location: Remote location  Delivery: TeleHealth     Virtual Encounter - Pain Management PROVIDER NOTE: Information contained herein reflects review and annotations entered in association with encounter. Interpretation of such information and data should be left to medically-trained personnel. Information provided to patient can be located elsewhere in the medical record under "Patient Instructions". Document created using STT-dictation technology, any transcriptional errors that may result from process are unintentional.    Contact & Pharmacy Preferred: [place]: [phone] (home) Mobile: [phone] (mobile) E-mail: No e-mail address on record  [facility], [address] [place]. [place] [address] Phone: [phone] Fax: [phone]   Pre-screening  Brittney Simpson offered "in-person" vs "virtual" encounter. She indicated preferring virtual for this encounter.   Reason COVID-19*  Social distancing based on CDC and AMA recommendations.   I contacted Brittney Simpson on 09/29/2019 via telephone.      I clearly identified myself as Gaspar Cola, MD. I verified that I was speaking with the correct person using two identifiers (Name: Brittney Simpson, and date of birth: [birth year]).  Consent I sought verbal advanced consent from Brittney Simpson for virtual visit interactions. I informed Brittney Simpson of possible security and privacy concerns, risks, and limitations associated with providing "not-in-person" medical evaluation and management services. I also informed Brittney Simpson of the availability of "in-person"  appointments. Finally, I informed her that there would be a charge for the virtual visit and that she could be  personally, fully or partially, financially responsible for it. Brittney Simpson expressed understanding and agreed to proceed.   Historic Elements   Brittney Simpson is a 86 y.o. year old, female patient evaluated today after her last contact with our practice on 09/08/2019. Brittney Simpson  has a past medical history of Anxiety, Back ache, Hiatal hernia, Hyperlipidemia, Hypertension, Hypothyroid, Osteopenia, Thyroid disease, and UTI (urinary tract infection). She also  has a past surgical history that includes Hernia repair; Hernia repair; Hemorrhoid surgery; and Kyphoplasty (N/A, 01/23/2017). Brittney Simpson has a current medication list which includes the following prescription(s): levothyroxine, acetaminophen, amlodipine, lidocaine, brimonidine, clonazepam, cranberry, vitamin d2, furosemide, latanoprost, lisinopril, metoprolol succinate, omeprazole, sulfamethoxazole-trimethoprim, timolol, tramadol, and trazodone. She  reports that she has never smoked. She has never used smokeless tobacco. She reports that she does not drink alcohol or use drugs. Brittney Simpson has No Known Allergies.   HPI  Today, she is being contacted for a post-procedure assessment.  Post-Procedure Evaluation  Procedure (09/07/2019): Palliative bilateral facet block under fluoroscopic guidance and no sedation Pre-procedure pain level:  8/10 Post-procedure: 0/10 (100% relief)  Sedation: None.  No notes on fileCurrent benefits: Defined as benefit that persist at this time.   Analgesia:   ***  Function:  ***  ROM:  ***   Pharmacotherapy Assessment  Analgesic: Tramadol 50 mg, 1 tab PO q 8 hrs (150 mg/day of tramadol) (should have enough to last until 11/01/2019) MME/day: 15 mg/day.   Monitoring: [place] PMP: PDMP reviewed during this encounter.       Pharmacotherapy: No side-effects or adverse reactions reported. Compliance:  No problems identified. Effectiveness: Clinically acceptable. Plan: Refer to "POC".  UDS:  Summary  Date Value Ref Range Status  10/28/2017 FINAL  Final    Comment:    ==================================================================== TOXASSURE SELECT 13 (MW) ==================================================================== Test                             Result       Flag       Units Drug Present and Declared for Prescription Verification   Tramadol                       >20000       EXPECTED   ng/mg creat   O-Desmethyltramadol            >20000       EXPECTED   ng/mg creat   N-Desmethyltramadol            8244         EXPECTED   ng/mg creat    Source of tramadol is a prescription medication.    O-desmethyltramadol and N-desmethyltramadol are expected    metabolites of tramadol. Drug Present not Declared for Prescription Verification   7-aminoclonazepam              248          UNEXPECTED ng/mg creat    7-aminoclonazepam is an expected metabolite of clonazepam. Source    of clonazepam is a scheduled prescription medication. ==================================================================== Test                      Result    Flag   Units      Ref Range   Creatinine              25               mg/dL      >=20 ==================================================================== Declared Medications:  The flagging and interpretation on this report are based on the  following declared medications.  Unexpected results may arise from  inaccuracies in the declared medications.  **Note: The testing scope of this panel includes these medications:  Tramadol  **Note: The testing scope of this panel does not include following  reported medications:  Acetaminophen  Amlodipine  Brimonidine Tartrate  Cranberry Extract  Furosemide  Latanoprost  Levothyroxine  Lidocaine  Lisinopril  Metoprolol  Omeprazole  Timolol  Trazodone  Vitamin  D2 ==================================================================== For clinical consultation, please call [phone]. ====================================================================    Laboratory Chemistry Profile   Renal Lab Results  Component Value Date   BUN 11 05/10/2019   CREATININE 0.97 (H) 05/10/2019   BCR 11 05/10/2019   GFRAA 60 05/10/2019   GFRNONAA 52 (L) 05/10/2019    Hepatic Lab Results  Component Value Date   AST 12 05/10/2019   ALT 9 05/10/2019   ALBUMIN 3.6 11/22/2016   ALKPHOS 59 11/22/2016   LIPASE 21 06/14/2016    Electrolytes Lab Results  Component Value Date   NA 137 05/10/2019   K 4.0 05/10/2019   CL 98 05/10/2019   CALCIUM 9.7 05/10/2019   MG 2.1 01/14/2014    Bone No results found for: VD25OH, VD125OH2TOT, HG9924QA8, TM1962IW9, 25OHVITD1, 25OHVITD2, 25OHVITD3, TESTOFREE, TESTOSTERONE  Inflammation (CRP: Acute Phase) (ESR: Chronic Phase) No results found for: CRP, ESRSEDRATE, LATICACIDVEN    Note: Above Lab results reviewed.  Imaging  DG PAIN CLINIC C-ARM 1-60 MIN NO REPORT Fluoro was used, but no Radiologist interpretation will be provided.  Please refer  to "NOTES" tab for provider progress note.  Assessment  The primary encounter diagnosis was Chronic pain syndrome. Diagnoses of Chronic low back pain (Primary Area of Pain) (Bilateral) (R>L) and Chronic hip pain (Secondary area of Pain) (Right) were also pertinent to this visit.  Plan of Care  Problem-specific:  No problem-specific Assessment & Plan notes found for this encounter.  Brittney Simpson has a current medication list which includes the following long-term medication(s): levothyroxine, amlodipine, clonazepam, furosemide, lisinopril, metoprolol succinate, omeprazole, tramadol, and trazodone.  Pharmacotherapy (Medications Ordered): No orders of the defined types were placed in this encounter.  Orders:  No orders of the defined types were placed in this  encounter.  Follow-up plan:   No follow-ups on file.      Interventional management options:  Considering: Possible bilateral lumbar facet RFA. (Offerred several times. Pt. chooses to hold.) Diagnostic right hip joint injection Diagnostic right L3 TFESI Diagnostic right trochanteric bursa injection Diagnostic right SI joint block Diagnostic midline caudal ESI (for the tailbone pain)   Palliative PRN treatment(s): Palliativeright lumbar facet block #12 Palliativeleft lumbar facet block #10 Palliative right L3-4 LESI      Recent Visits Date Type Provider Dept  09/07/19 Procedure visit Milinda Pointer, MD [facility]  Showing recent visits within past 90 days and meeting all other requirements   Future Appointments Date Type Provider Dept  09/29/19 Appointment Milinda Pointer, [facility]  10/27/19 Appointment Milinda Pointer, MD [facility]  Showing future appointments within next 90 days and meeting all other requirements   I discussed the assessment and treatment plan with the patient. The patient was provided an opportunity to ask questions and all were answered. The patient agreed with the plan and demonstrated an understanding of the instructions.  Patient advised to call back or seek an in-person evaluation if the symptoms or condition worsens.  Duration of encounter: *** minutes.  Note by: Gaspar Cola, MD Date: 09/29/2019; Time: 8:36 AM

## 2019-09-29 ENCOUNTER — Telehealth (HOSPITAL_BASED_OUTPATIENT_CLINIC_OR_DEPARTMENT_OTHER): Payer: Medicare Other | Admitting: Pain Medicine

## 2019-09-29 DIAGNOSIS — G894 Chronic pain syndrome: Secondary | ICD-10-CM

## 2019-09-30 NOTE — Progress Notes (Signed)
Canceled.

## 2019-10-08 ENCOUNTER — Other Ambulatory Visit: Payer: Self-pay | Admitting: Family Medicine

## 2019-10-08 DIAGNOSIS — F419 Anxiety disorder, unspecified: Secondary | ICD-10-CM

## 2019-10-26 ENCOUNTER — Other Ambulatory Visit: Payer: Self-pay

## 2019-10-26 ENCOUNTER — Ambulatory Visit (INDEPENDENT_AMBULATORY_CARE_PROVIDER_SITE_OTHER): Payer: Medicare Other | Admitting: Vascular Surgery

## 2019-10-26 ENCOUNTER — Ambulatory Visit (INDEPENDENT_AMBULATORY_CARE_PROVIDER_SITE_OTHER): Payer: Medicare Other

## 2019-10-26 ENCOUNTER — Encounter: Payer: Self-pay | Admitting: Pain Medicine

## 2019-10-26 ENCOUNTER — Encounter (INDEPENDENT_AMBULATORY_CARE_PROVIDER_SITE_OTHER): Payer: Self-pay | Admitting: Vascular Surgery

## 2019-10-26 VITALS — BP 167/67 | HR 64 | Resp 16 | Ht 65.0 in | Wt 134.0 lb

## 2019-10-26 DIAGNOSIS — I714 Abdominal aortic aneurysm, without rupture, unspecified: Secondary | ICD-10-CM

## 2019-10-26 DIAGNOSIS — M545 Low back pain: Secondary | ICD-10-CM

## 2019-10-26 DIAGNOSIS — I10 Essential (primary) hypertension: Secondary | ICD-10-CM | POA: Diagnosis not present

## 2019-10-26 DIAGNOSIS — E782 Mixed hyperlipidemia: Secondary | ICD-10-CM

## 2019-10-26 DIAGNOSIS — N1831 Chronic kidney disease, stage 3a: Secondary | ICD-10-CM

## 2019-10-26 DIAGNOSIS — G8929 Other chronic pain: Secondary | ICD-10-CM | POA: Diagnosis not present

## 2019-10-26 NOTE — Progress Notes (Signed)
MRN : [id number]  Brittney Simpson is a 86 y.o. (12-14-1927) female who presents with chief complaint of  Chief Complaint  Patient presents with  . Follow-up    ultrasound follow up   .  History of Present Illness: Patient returns today in follow up of abdominal aortic aneurysm.  No current aneurysm related symptoms. Specifically, the patient denies new back or abdominal pain, or signs of peripheral embolization.  No new complaints today.  Much of the history is obtained from her daughter as she is not a great historian.  Current Outpatient Medications  Medication Sig Dispense Refill  . acetaminophen (TYLENOL) 500 MG tablet Take 1,000 mg by mouth daily as needed for moderate pain or headache.    Marland Kitchen amLODipine (NORVASC) 5 MG tablet TAKE 1 TABLET BY MOUTH ONCE DAILY 90 tablet 3  . ASPERCREME LIDOCAINE EX Apply 1 application topically.    . brimonidine (ALPHAGAN) 0.2 % ophthalmic solution Place 1 drop into the left eye 2 (two) times daily.     . clonazePAM (KLONOPIN) 0.5 MG tablet TAKE 1/2 TABLET BY MOUTH TWICE DAILY 30 tablet 2  . Cranberry 250 MG CAPS Take 250 mg by mouth daily.    . Ergocalciferol (VITAMIN D2) 2000 UNITS TABS Take 2,000 Units by mouth daily with lunch.     . furosemide (LASIX) 20 MG tablet Take 1.5 tablets (30 mg total) by mouth daily as needed for fluid. 45 tablet 5  . latanoprost (XALATAN) 0.005 % ophthalmic solution Place 1 drop into the left eye at bedtime.     Marland Kitchen levothyroxine (SYNTHROID) 112 MCG tablet TAKE 1 TABLET BY MOUTH ONCE DAILY ON AN EMPTY STOMACH. WAIT 30 MINS BEFORE TAKING OTHER MEDS. 90 tablet 3  . lisinopril (ZESTRIL) 40 MG tablet Take 1 tablet (40 mg total) by mouth daily. 30 tablet 5  . metoprolol succinate (TOPROL-XL) 100 MG 24 hr tablet Take 1 tablet (100 mg total) by mouth daily. Take with or immediately following a meal. 30 tablet 5  . omeprazole (PRILOSEC) 20 MG capsule TAKE 1 CAPSULE BY MOUTH ONCE DAILY 90 capsule 1  .  sulfamethoxazole-trimethoprim (BACTRIM) 400-80 MG tablet     . timolol (TIMOPTIC) 0.5 % ophthalmic solution Place 1 drop into the left eye 2 (two) times daily.     . traMADol (ULTRAM) 50 MG tablet Take 1 tablet (50 mg total) by mouth 3 (three) times daily as needed for severe pain. 90 tablet 5  . traZODone (DESYREL) 50 MG tablet TAKE 1 TABLET BY MOUTH AT BEDTIME 90 tablet 3   No current facility-administered medications for this visit.    Past Medical History:  Diagnosis Date  . Anxiety   . Back ache   . Hiatal hernia   . Hyperlipidemia   . Hypertension   . Hypothyroid   . Osteopenia   . Thyroid disease   . UTI (urinary tract infection)     Past Surgical History:  Procedure Laterality Date  . HEMORRHOID SURGERY    . HERNIA REPAIR    . HERNIA REPAIR    . KYPHOPLASTY N/A 01/23/2017   Procedure: [id number];  Surgeon: Hessie Knows, MD;  Location: [facility] ORS;  Service: Orthopedics;  Laterality: N/A;     Social History   Tobacco Use  . Smoking status: Never Smoker  . Smokeless tobacco: Never Used  Substance Use Topics  . Alcohol use: No    Alcohol/week: 0.0 standard drinks  . Drug use: No    Family  History  Problem Relation Age of Onset  . Stroke Mother   . Cancer Father   No bleeding or clotting disorders.  No aneurysms.   No Known Allergies  REVIEW OF SYSTEMS(Negative unless checked)  Constitutional: [] ???Weight loss[] ???Fever[] ???Chills Cardiac:[] ???Chest pain[] ???Chest pressure[] ???Palpitations [] ???Shortness of breath when laying flat [] ???Shortness of breath at rest [] ???Shortness of breath with exertion. Vascular: [] ???Pain in legs with walking[] ???Pain in legsat rest[] ???Pain in legs when laying flat [] ???Claudication [] ???Pain in feet when walking [] ???Pain in feet at rest [] ???Pain in feet when laying flat [] ???History of DVT [] ???Phlebitis [] ???Swelling in legs [] ???Varicose veins [] ???Non-healing ulcers  Pulmonary: [] ???Uses home oxygen [] ???Productive cough[] ???Hemoptysis [] ???Wheeze [] ???COPD [] ???Asthma Neurologic: [] ???Dizziness [] ???Blackouts [] ???Seizures [] ???History of stroke [] ???History of TIA[] ???Aphasia [] ???Temporary blindness[] ???Dysphagia [] ???Weaknessor numbness in arms [] ???Weakness or numbnessin legs Musculoskeletal: [x] ???Arthritis [] ???Joint swelling [] ???Joint pain [x] ???Low back pain Hematologic:[] ???Easy bruising[] ???Easy bleeding [] ???Hypercoagulable state [] ???Anemic [] ???Hepatitis Gastrointestinal:[] ???Blood in stool[] ???Vomiting blood[] ???Gastroesophageal reflux/heartburn[] ???Abdominal pain Genitourinary: [] ???Chronic kidney disease [] ???Difficulturination [x] ???Frequenturination [] ???Burning with urination[] ???Hematuria Skin: [] ???Rashes [] ???Ulcers [] ???Wounds Psychological: [x] ???History of anxiety[] ???History of major depression  Physical Examination  BP (!) 167/67 (BP Location: Right Arm)   Pulse 64   Resp 16   Ht 5\' 5"  (1.651 m)   Wt 134 lb (60.8 kg)   BMI 22.30 kg/m  Gen:  WD/WN, NAD.  Appears younger than stated age Head: [place]/AT, No temporalis wasting. Ear/Nose/Throat: Hearing grossly intact, nares w/o erythema or drainage Eyes: Conjunctiva clear. Sclera non-icteric Neck: Supple.  Trachea midline Pulmonary:  Good air movement, no use of accessory muscles.  Cardiac: RRR, no JVD Vascular:  Vessel Right Left  Radial Palpable Palpable                                   Gastrointestinal: soft, non-tender/non-distended.  Increased aortic impulse present Musculoskeletal: M/S 5/5 throughout.  No deformity or atrophy.  Trace lower extremity edema. Neurologic: Sensation grossly intact in extremities.  Symmetrical.  Speech is fluent.  Psychiatric: Judgment intact, Mood & affect appropriate for pt's clinical situation. Dermatologic: No rashes or ulcers noted.  No cellulitis or  open wounds.       Labs No results found for this or any previous visit (from the past 2160 hour(s)).  Radiology No results found.  Assessment/Plan Essential hypertension blood pressure control important in reducing the progression of atherosclerotic disease and aneurysmal degeneration. On appropriate oral medications.   Hyperlipidemia lipid control important in reducing the progression of atherosclerotic disease. Continue statin therapy   Chronic low back pain (Bilateral) (R>L) Thisnot likely from her aneurysm  CKD (chronic kidney disease) stage 3, GFR 30-59 ml/min Seesnephrology. We will try to avoid any contrast administration unless the aneurysm is reaching a threshold size for consideration for repair.   AAA (abdominal aortic aneurysm) without rupture (HCC) Duplex today shows no growth in her abdominal aortic aneurysm still measuring 4.6 cm in maximal diameter.  This is unchanged from her study 6 months ago.  No role for intervention particularly and a person nearing her 89th birthday.  We will continue to follow this on 80-month intervals and if this grows over 5 cm in maximal diameter we will have to make a decision on whether or not we proceed with repair.    Leotis Pain, MD  10/26/2019 10:18 AM    This note was created with Dragon medical transcription system.  Any errors from dictation are purely unintentional

## 2019-10-26 NOTE — Progress Notes (Signed)
Patient: Brittney Simpson  Service Category: E/M  Provider: Gaspar Cola, MD  DOB: Jun 23, 1928  DOS: 10/27/2019  Location: Office  MRN: [id number]  Setting: Ambulatory outpatient  Referring Provider: Nobie Simpson *  Type: Established Patient  Specialty: Interventional Pain Management  PCP: Brittney Hauser, DO  Location: Remote location  Delivery: TeleHealth     Virtual Encounter - Pain Management PROVIDER NOTE: Information contained herein reflects review and annotations entered in association with encounter. Interpretation of such information and data should be left to medically-trained personnel. Information provided to patient can be located elsewhere in the medical record under "Patient Instructions". Document created using STT-dictation technology, any transcriptional errors that may result from process are unintentional.    Contact & Pharmacy Preferred: [place]: [phone] (home) Mobile: [phone] (mobile) E-mail: No e-mail address on record  [facility], [address] [place] [address] Phone: [phone] Fax: [phone]   Pre-screening  Brittney Simpson offered "in-person" vs "virtual" encounter. She indicated preferring virtual for this encounter.   Reason COVID-19*  Social distancing based on CDC and AMA recommendations.   I contacted Brittney Simpson on 10/27/2019 via telephone.      I clearly identified myself as Brittney Cola, MD. I verified that I was speaking with the correct person using two identifiers (Name: Brittney Simpson, and date of birth: 04/22/1928).  Consent I sought verbal advanced consent from Brittney Simpson for virtual visit interactions. I informed Brittney Simpson of possible security and privacy concerns, risks, and limitations associated with providing "not-in-person" medical evaluation and management services. I also informed Brittney Simpson of the availability of "in-person"  appointments. Finally, I informed her that there would be a charge for the virtual visit and that she could be  personally, fully or partially, financially responsible for it. Brittney Simpson expressed understanding and agreed to proceed.   Historic Elements   Brittney Simpson is a 86 y.o. year old, female patient evaluated today after her last contact with our practice on 09/08/2019. Brittney Simpson  has a past medical history of Anxiety, Back ache, Hiatal hernia, Hyperlipidemia, Hypertension, Hypothyroid, Osteopenia, Thyroid disease, and UTI (urinary tract infection). She also  has a past surgical history that includes Hernia repair; Hernia repair; Hemorrhoid surgery; and Kyphoplasty (N/A, 01/23/2017). Brittney Simpson has a current medication list which includes the following prescription(s): acetaminophen, amlodipine, lidocaine, brimonidine, clonazepam, cranberry, vitamin d2, furosemide, latanoprost, levothyroxine, lisinopril, metoprolol succinate, omeprazole, sulfamethoxazole-trimethoprim, timolol, [START ON 11/01/2019] tramadol, and trazodone. She  reports that she has never smoked. She has never used smokeless tobacco. She reports that she does not drink alcohol or use drugs. Brittney Simpson has No Known Allergies.   HPI  Today, she is being contacted for both, medication management and a post-procedure assessment. The patient indicates doing well with the current medication regimen. No adverse reactions or side effects reported to the medications.  Today I had the opportunity to talk to Brittney Simpson, the patient's daughter, and she confirmed that she is doing extremely well after the palliative bilateral lumbar facet block, as usual.  In addition, she is not having any kind of problems with her pain medication.  Today I have sent refills to her pharmacy.  In addition to this she had a couple of other questions regarding the COVID-19 injection and her mother, all of which were answered to her  satisfaction.  Post-Procedure Evaluation  Procedure (09/07/2019): Palliative bilateral lumbar facet block under  fluoroscopic guidance, no sedation Pre-procedure pain level:  8/10 Post-procedure: 0/10 (100% relief)  Sedation: None.  Brittney Simpson  10/26/2019 11:17 AM  Sign when Signing Visit Pain relief after procedure (treated area only): (Questions asked to patient) 1. Starting about 15 minutes after the procedure, and "while the area was still numb" (from the local anesthetics), were you having any of your usual pain "in that area" (the treated area)?  (NOTE: NOT including the discomfort from the needle sticks.) First 1 hour: 100 % better. First 4-6 hours:100 % better. 2. How long did the numbness from the local anesthetics last? (More than 6 hours?) Duration: 3 hours.  3. How much better is your pain now, when compared to before the procedure? Current benefit: 100 % better. 4. Can you move better now? Improvement in ROM (Range of Motion): Yes. 5. Can you do more now? Improvement in function: Yes. 4. Did you have any problems with the procedure? Side-effects/Complications: No.  Current benefits: Defined as benefit that persist at this time.   Analgesia:  90-100% better Function: Brittney Simpson reports improvement in function ROM: Brittney Simpson reports improvement in ROM  Pharmacotherapy Assessment  Analgesic: Tramadol 50 mg, 1 tab PO q 8 hrs (150 mg/day of tramadol) (should have enough to last until 11/01/2019) MME/day: 15 mg/day.   Monitoring: [place] PMP: PDMP reviewed during this encounter.       Pharmacotherapy: No side-effects or adverse reactions reported. Compliance: No problems identified. Effectiveness: Clinically acceptable. Plan: Refer to "POC".  UDS:  Summary  Date Value Ref Range Status  10/28/2017 FINAL  Final    Comment:    ==================================================================== TOXASSURE SELECT 13  (MW) ==================================================================== Test                             Result       Flag       Units Drug Present and Declared for Prescription Verification   Tramadol                       >20000       EXPECTED   ng/mg creat   O-Desmethyltramadol            >20000       EXPECTED   ng/mg creat   N-Desmethyltramadol            8244         EXPECTED   ng/mg creat    Source of tramadol is a prescription medication.    O-desmethyltramadol and N-desmethyltramadol are expected    metabolites of tramadol. Drug Present not Declared for Prescription Verification   7-aminoclonazepam              248          UNEXPECTED ng/mg creat    7-aminoclonazepam is an expected metabolite of clonazepam. Source    of clonazepam is a scheduled prescription medication. ==================================================================== Test                      Result    Flag   Units      Ref Range   Creatinine              25               mg/dL      >=20 ==================================================================== Declared Medications:  The flagging and interpretation on this report are  based on the  following declared medications.  Unexpected results may arise from  inaccuracies in the declared medications.  **Note: The testing scope of this panel includes these medications:  Tramadol  **Note: The testing scope of this panel does not include following  reported medications:  Acetaminophen  Amlodipine  Brimonidine Tartrate  Cranberry Extract  Furosemide  Latanoprost  Levothyroxine  Lidocaine  Lisinopril  Metoprolol  Omeprazole  Timolol  Trazodone  Vitamin D2 ==================================================================== For clinical consultation, please call [phone]. ====================================================================    Laboratory Chemistry Profile   Renal Lab Results  Component Value Date   BUN 11 05/10/2019    CREATININE 0.97 (H) 05/10/2019   BCR 11 05/10/2019   GFRAA 60 05/10/2019   GFRNONAA 52 (L) 05/10/2019     Hepatic Lab Results  Component Value Date   AST 12 05/10/2019   ALT 9 05/10/2019   ALBUMIN 3.6 11/22/2016   ALKPHOS 59 11/22/2016   LIPASE 21 06/14/2016     Electrolytes Lab Results  Component Value Date   NA 137 05/10/2019   K 4.0 05/10/2019   CL 98 05/10/2019   CALCIUM 9.7 05/10/2019   MG 2.1 01/14/2014     Bone No results found for: VD25OH, VD125OH2TOT, FO2774JO8, NO6767MC9, 25OHVITD1, 25OHVITD2, 25OHVITD3, TESTOFREE, TESTOSTERONE   Inflammation (CRP: Acute Phase) (ESR: Chronic Phase) No results found for: CRP, ESRSEDRATE, LATICACIDVEN     Note: Above Lab results reviewed.  Imaging  AAA Duplex ABDOMINAL AORTA STUDY  Limitations: Air/bowel gas.    Comparison Study: 03/16/2019  Performing Technologist: Charlane Ferretti RT (R)(VS)    Examination Guidelines: A complete evaluation includes B-mode imaging, spectral Doppler, color Doppler, and power Doppler as needed of all accessible portions of each vessel. Bilateral testing is considered an integral part of a complete examination. Limited examinations for reoccurring indications may be performed as noted.    Abdominal Aorta Findings: +-----------+-------+----------+----------+--------+--------+--------+ Location   AP (cm)Trans (cm)PSV (cm/s)WaveformThrombusComments +-----------+-------+----------+----------+--------+--------+--------+ Proximal   3.70   4.15      53                                 +-----------+-------+----------+----------+--------+--------+--------+ Mid        3.95   4.61      214                                +-----------+-------+----------+----------+--------+--------+--------+ Distal     3.56   4.00      174                                +-----------+-------+----------+----------+--------+--------+--------+ RT CIA Prox1.3    1.3       156                                 +-----------+-------+----------+----------+--------+--------+--------+ LT CIA Prox1.3    1.3       112                                +-----------+-------+----------+----------+--------+--------+--------+    Summary: Abdominal Aorta: There is evidence of abnormal dilatation of the proximal, mid and distal Abdominal aorta. The largest aortic diameter remains essentially unchanged compared to prior exam.  Previous diameter measurement was 4.6 cm obtained on 03/16/2019.   *See table(s) above for measurements and observations.   Electronically signed by Leotis Pain MD on 10/26/2019 at 6:09:42 PM.    Final    Assessment  The primary encounter diagnosis was Lumbar facet syndrome (Bilateral) (R>L). Diagnoses of Chronic low back pain (Primary Area of Pain) (Bilateral) (R>L), Chronic hip pain (Secondary area of Pain) (Right), Chronic lower extremity pain (Third area of Pain) (Right), Chronic pain syndrome, and Spinal stenosis of lumbar region, unspecified whether neurogenic claudication present were also pertinent to this visit.  Plan of Care  Problem-specific:  No problem-specific Assessment & Plan notes found for this encounter.  Brittney Simpson has a current medication list which includes the following long-term medication(s): amlodipine, clonazepam, furosemide, levothyroxine, lisinopril, metoprolol succinate, omeprazole, [START ON 11/01/2019] tramadol, and trazodone.  Pharmacotherapy (Medications Ordered): Meds ordered this encounter  Medications  . traMADol (ULTRAM) 50 MG tablet    Sig: Take 1 tablet (50 mg total) by mouth 3 (three) times daily as needed for severe pain.    Dispense:  90 tablet    Refill:  5    Chronic Pain: STOP Act (Not applicable) Fill 1 day early if closed on refill date. Do not fill until: 11/01/2019. To last until: 04/29/2020. Avoid benzodiazepines within 8 hours of opioids   Orders:  Orders Placed This Encounter  Procedures  .  LUMBAR FACET(MEDIAL BRANCH NERVE BLOCK) MBNB    Scheduling timeframe: (PRN procedure) Brittney Simpson will call when needed. Clinical indication: Axial low back pain. Lumbosacral Spondylosis (M47.897).  Sedation: Usually done with sedation. (May be done without sedation if so desired by patient.) Requirements: NPO x 8 hrs.; Driver; Stop blood thinners. Interval: No sooner than two weeks for diagnostic or therapeutic. No sooner than every other month for palliative.    Standing Status:   Standing    Number of Occurrences:   5    Standing Expiration Date:   10/26/2020    Scheduling Instructions:     Procedure: Lumbar facet block (AKA.: Lumbosacral medial branch nerve block)     Level: L3-4, L4-5, & L5-S1 Facets (L2, L3, L4, L5, & S1 Medial Branch Nerves)     Laterality: Bilateral    Order Specific Question:   Where will this procedure be performed?    Answer:   [facility]   Follow-up plan:   Return in about 26 weeks (around 04/26/2020) for (VV), (MM), in addition, PRN Procedure(s): (B) L-FCT BLK.      Interventional management options:  Considering: Possible bilateral lumbar facet RFA. (Offerred several times. Pt. chooses to hold.) Diagnostic right hip joint injection Diagnostic right L3 TFESI Diagnostic right trochanteric bursa injection Diagnostic right SI joint block Diagnostic midline caudal ESI (for the tailbone pain)   Palliative PRN treatment(s): Palliativeright lumbar facet block #12 Palliativeleft lumbar facet block #10 Palliative right L3-4 LESI    Recent Visits Date Type Provider Dept  09/07/19 Procedure visit Milinda Pointer, MD [facility]  Showing recent visits within past 90 days and meeting all other requirements   Today's Visits Date Type Provider Dept  10/27/19 Telemedicine Milinda Pointer, MD [facility]  Showing today's visits and meeting all other requirements   Future Appointments No visits were found meeting  these conditions.  Showing future appointments within next 90 days and meeting all other requirements   I discussed the assessment and treatment plan with the patient. The patient was provided an opportunity  to ask questions and all were answered. The patient agreed with the plan and demonstrated an understanding of the instructions.  Patient advised to call back or seek an in-person evaluation if the symptoms or condition worsens.  Duration of encounter: 12 minutes.  Note by: Brittney Cola, MD Date: 10/27/2019; Time: 11:22 AM

## 2019-10-26 NOTE — Patient Instructions (Signed)
Abdominal Aortic Aneurysm  An aneurysm is a bulge in one of the blood vessels that carry blood away from the heart (artery). It happens when blood pushes up against a weak or damaged place in the wall of an artery. An abdominal aortic aneurysm happens in the main artery of the body (aorta). Some aneurysms may not cause problems. If it grows, it can burst or tear, causing bleeding inside the body. This is an emergency. It needs to be treated right away. What are the causes? The exact cause of this condition is not known. What increases the risk? The following may make you more likely to get this condition:  Being a female who is 60 years of age or older.  Being white (Caucasian).  Using tobacco.  Having a family history of aneurysms.  Having the following conditions: ? Hardening of the arteries (arteriosclerosis). ? Inflammation of the walls of an artery (arteritis). ? Certain genetic conditions. ? Being very overweight (obesity). ? An infection in the wall of the aorta (infectious aortitis). ? High cholesterol. ? High blood pressure (hypertension). What are the signs or symptoms? Symptoms depend on the size of the aneurysm and how fast it is growing. Most grow slowly and do not cause any symptoms. If symptoms do occur, they may include:  Pain in the belly (abdomen), side, or back.  Feeling full after eating only small amounts of food.  Feeling a throbbing lump in the belly. Symptoms that the aneurysm has burst (ruptured) include:  Sudden, very bad pain in the belly, side, or back.  Feeling sick to your stomach (nauseous).  Throwing up (vomiting).  Feeling light-headed or passing out. How is this treated? Treatment for this condition depends on:  The size of the aneurysm.  How fast it is growing.  Your age.  Your risk of having it burst. If your aneurysm is smaller than 2 inches (5 cm), your doctor may manage it by:  Checking it often to see if it is getting bigger.  You may have an imaging test (ultrasound) to check it every 3-6 months, every year, or every few years.  Giving you medicines to: ? Control blood pressure. ? Treat pain. ? Fight infection. If your aneurysm is larger than 2 inches (5 cm), you may need surgery to fix it. Follow these instructions at home: Lifestyle  Do not use any products that have nicotine or tobacco in them. This includes cigarettes, e-cigarettes, and chewing tobacco. If you need help quitting, ask your doctor.  Get regular exercise. Ask your doctor what types of exercise are best for you. Eating and drinking  Eat a heart-healthy diet. This includes eating plenty of: ? Fresh fruits and vegetables. ? Whole grains. ? Low-fat (lean) protein. ? Low-fat dairy products.  Avoid foods that are high in saturated fat and cholesterol. These foods include red meat and some dairy products.  Do not drink alcohol if: ? Your doctor tells you not to drink. ? You are pregnant, may be pregnant, or are planning to become pregnant.  If you drink alcohol: ? Limit how much you use to:  0-1 drink a day for women.  0-2 drinks a day for men. ? Be aware of how much alcohol is in your drink. In the [place] one drink equals any of these:  One typical bottle of beer (12 oz).  One-half glass of wine (5 oz).  One shot of hard liquor (1 oz). General instructions  Take over-the-counter and prescription medicines only as   told by your doctor.  Keep your blood pressure within normal limits. Ask your doctor what your blood pressure should be.  Have your blood sugar (glucose) level and cholesterol levels checked regularly. Keep your blood sugar level and cholesterol levels within normal limits.  Avoid heavy lifting and activities that take a lot of effort. Ask your doctor what activities are safe for you.  Keep all follow-up visits as told by your doctor. This is important. ? Talk to your doctor about regular screenings to see if the  aneurysm is getting bigger. Contact a doctor if you:  Have pain in your belly, side, or back.  Have a throbbing feeling in your belly.  Have a family history of aneurysms. Get help right away if you:  Have sudden, bad pain in your belly, side, or back.  Feel sick to your stomach.  Throw up.  Have trouble pooping (constipation).  Have trouble peeing (urinating).  Feel light-headed.  Have a fast heart rate when you stand.  Have sweaty skin that is cold to the touch (clammy).  Have shortness of breath.  Have a fever. These symptoms may be an emergency. Do not wait to see if the symptoms will go away. Get medical help right away. Call your local emergency services (911 in the [place] Do not drive yourself to the hospital. Summary  An aneurysm is a bulge in one of the blood vessels that carry blood away from the heart (artery). Some aneurysms may not cause problems.  You may need to have yours checked often. If it grows, it can burst or tear. This causes bleeding inside the body. It needs to be treated right away.  Follow instructions from your doctor about healthy lifestyle changes.  Keep all follow-up visits as told by your doctor. This is important. This information is not intended to replace advice given to you by your health care provider. Make sure you discuss any questions you have with your health care provider. Document Revised: 11/02/2018 Document Reviewed: 02/21/2018 Elsevier Patient Education  2020 [employer]  

## 2019-10-26 NOTE — Progress Notes (Signed)
Pain relief after procedure (treated area only): (Questions asked to patient) 1. Starting about 15 minutes after the procedure, and "while the area was still numb" (from the local anesthetics), were you having any of your usual pain "in that area" (the treated area)?  (NOTE: NOT including the discomfort from the needle sticks.) First 1 hour: 100 % better. First 4-6 hours:100 % better. 2. How long did the numbness from the local anesthetics last? (More than 6 hours?) Duration: 3 hours.  3. How much better is your pain now, when compared to before the procedure? Current benefit: 100 % better. 4. Can you move better now? Improvement in ROM (Range of Motion): Yes. 5. Can you do more now? Improvement in function: Yes. 4. Did you have any problems with the procedure? Side-effects/Complications: No.

## 2019-10-26 NOTE — Assessment & Plan Note (Signed)
Duplex today shows no growth in her abdominal aortic aneurysm still measuring 4.6 cm in maximal diameter.  This is unchanged from her study 6 months ago.  No role for intervention particularly and a person nearing her 89th birthday.  We will continue to follow this on 78-month intervals and if this grows over 5 cm in maximal diameter we will have to make a decision on whether or not we proceed with repair.

## 2019-10-26 NOTE — Patient Instructions (Signed)
____________________________________________________________________________________________  Preparing for Procedure with Sedation  Procedure appointments are limited to planned procedures: . No Prescription Refills. . No disability issues will be discussed. . No medication changes will be discussed.  Instructions: . Oral Intake: Do not eat or drink anything for at least 3 hours prior to your procedure. (Exception: Blood Pressure Medication. See below.) . Transportation: Unless otherwise stated by your physician, you may drive yourself after the procedure. . Blood Pressure Medicine: Do not forget to take your blood pressure medicine with a sip of water the morning of the procedure. If your Diastolic (lower reading)is above 100 mmHg, elective cases will be cancelled/rescheduled. . Blood thinners: These will need to be stopped for procedures. Notify our staff if you are taking any blood thinners. Depending on which one you take, there will be specific instructions on how and when to stop it. . Diabetics on insulin: Notify the staff so that you can be scheduled 1st case in the morning. If your diabetes requires high dose insulin, take only  of your normal insulin dose the morning of the procedure and notify the staff that you have done so. . Preventing infections: Shower with an antibacterial soap the morning of your procedure. . Build-up your immune system: Take 1000 mg of Vitamin C with every meal (3 times a day) the day prior to your procedure. . Antibiotics: Inform the staff if you have a condition or reason that requires you to take antibiotics before dental procedures. . Pregnancy: If you are pregnant, call and cancel the procedure. . Sickness: If you have a cold, fever, or any active infections, call and cancel the procedure. . Arrival: You must be in the facility at least 30 minutes prior to your scheduled procedure. . Children: Do not bring children with you. . Dress appropriately:  Bring dark clothing that you would not mind if they get stained. . Valuables: Do not bring any jewelry or valuables.  Reasons to call and reschedule or cancel your procedure: (Following these recommendations will minimize the risk of a serious complication.) . Surgeries: Avoid having procedures within 2 weeks of any surgery. (Avoid for 2 weeks before or after any surgery). . Flu Shots: Avoid having procedures within 2 weeks of a flu shots or . (Avoid for 2 weeks before or after immunizations). . Barium: Avoid having a procedure within 7-10 days after having had a radiological study involving the use of radiological contrast. (Myelograms, Barium swallow or enema study). . Heart attacks: Avoid any elective procedures or surgeries for the initial 6 months after a "Myocardial Infarction" (Heart Attack). . Blood thinners: It is imperative that you stop these medications before procedures. Let us know if you if you take any blood thinner.  . Infection: Avoid procedures during or within two weeks of an infection (including chest colds or gastrointestinal problems). Symptoms associated with infections include: Localized redness, fever, chills, night sweats or profuse sweating, burning sensation when voiding, cough, congestion, stuffiness, runny nose, sore throat, diarrhea, nausea, vomiting, cold or Flu symptoms, recent or current infections. It is specially important if the infection is over the area that we intend to treat. . Heart and lung problems: Symptoms that may suggest an active cardiopulmonary problem include: cough, chest pain, breathing difficulties or shortness of breath, dizziness, ankle swelling, uncontrolled high or unusually low blood pressure, and/or palpitations. If you are experiencing any of these symptoms, cancel your procedure and contact your primary care physician for an evaluation.  Remember:  Regular Business hours are:    Monday to Thursday 8:00 AM to 4:00 PM  Provider's  Schedule: Talise Sligh, MD:  Procedure days: Tuesday and Thursday 7:30 AM to 4:00 PM  Bilal Lateef, MD:  Procedure days: Monday and Wednesday 7:30 AM to 4:00 PM ____________________________________________________________________________________________    

## 2019-10-27 ENCOUNTER — Ambulatory Visit: Payer: Medicare Other | Attending: Pain Medicine | Admitting: Pain Medicine

## 2019-10-27 DIAGNOSIS — M79604 Pain in right leg: Secondary | ICD-10-CM | POA: Diagnosis not present

## 2019-10-27 DIAGNOSIS — G8929 Other chronic pain: Secondary | ICD-10-CM | POA: Diagnosis not present

## 2019-10-27 DIAGNOSIS — G894 Chronic pain syndrome: Secondary | ICD-10-CM | POA: Diagnosis not present

## 2019-10-27 DIAGNOSIS — M48061 Spinal stenosis, lumbar region without neurogenic claudication: Secondary | ICD-10-CM | POA: Diagnosis not present

## 2019-10-27 DIAGNOSIS — M545 Low back pain, unspecified: Secondary | ICD-10-CM

## 2019-10-27 DIAGNOSIS — M25551 Pain in right hip: Secondary | ICD-10-CM | POA: Diagnosis not present

## 2019-10-27 DIAGNOSIS — M47816 Spondylosis without myelopathy or radiculopathy, lumbar region: Secondary | ICD-10-CM | POA: Diagnosis not present

## 2019-10-27 MED ORDER — TRAMADOL HCL 50 MG PO TABS: 50.0000 mg | ORAL_TABLET | Freq: Three times a day (TID) | ORAL | 5 refills | Status: DC | PRN

## 2019-10-30 ENCOUNTER — Other Ambulatory Visit: Payer: Self-pay | Admitting: Family Medicine

## 2019-10-30 DIAGNOSIS — I129 Hypertensive chronic kidney disease with stage 1 through stage 4 chronic kidney disease, or unspecified chronic kidney disease: Secondary | ICD-10-CM

## 2019-11-02 ENCOUNTER — Ambulatory Visit: Payer: Medicare Other | Admitting: Pain Medicine

## 2019-11-10 ENCOUNTER — Ambulatory Visit (INDEPENDENT_AMBULATORY_CARE_PROVIDER_SITE_OTHER): Payer: Medicare Other | Admitting: Family Medicine

## 2019-11-10 ENCOUNTER — Other Ambulatory Visit: Payer: Self-pay | Admitting: Family Medicine

## 2019-11-10 ENCOUNTER — Encounter: Payer: Self-pay | Admitting: Family Medicine

## 2019-11-10 ENCOUNTER — Other Ambulatory Visit: Payer: Self-pay

## 2019-11-10 VITALS — BP 134/61 | HR 57 | Temp 97.5°F | Resp 16 | Ht 65.5 in | Wt 134.0 lb

## 2019-11-10 DIAGNOSIS — I129 Hypertensive chronic kidney disease with stage 1 through stage 4 chronic kidney disease, or unspecified chronic kidney disease: Secondary | ICD-10-CM

## 2019-11-10 DIAGNOSIS — N390 Urinary tract infection, site not specified: Secondary | ICD-10-CM | POA: Diagnosis not present

## 2019-11-10 DIAGNOSIS — R7309 Other abnormal glucose: Secondary | ICD-10-CM

## 2019-11-10 DIAGNOSIS — F419 Anxiety disorder, unspecified: Secondary | ICD-10-CM

## 2019-11-10 DIAGNOSIS — E034 Atrophy of thyroid (acquired): Secondary | ICD-10-CM

## 2019-11-10 DIAGNOSIS — N183 Chronic kidney disease, stage 3 unspecified: Secondary | ICD-10-CM | POA: Diagnosis not present

## 2019-11-10 DIAGNOSIS — R140 Abdominal distension (gaseous): Secondary | ICD-10-CM | POA: Diagnosis not present

## 2019-11-10 DIAGNOSIS — E782 Mixed hyperlipidemia: Secondary | ICD-10-CM

## 2019-11-10 MED ORDER — LISINOPRIL 40 MG PO TABS: 40.0000 mg | ORAL_TABLET | Freq: Every day | ORAL | 5 refills | Status: DC

## 2019-11-10 MED ORDER — CIPROFLOXACIN HCL 500 MG PO TABS: 500.0000 mg | ORAL_TABLET | Freq: Two times a day (BID) | ORAL | 2 refills | Status: DC

## 2019-11-10 NOTE — Assessment & Plan Note (Signed)
Stable, chronic anxiety, with associated insomnia Chronically on BDZ without history of abuse or dose increase Checked [place] CSRS without red flag  Plan: 1. Reviewed chronic BDZ use for anxiety, high risk sedation, fall, confusion, withdrawal risks - med tolerated very well, aware not to increase dose - Continue Clonazepam 0.5mg HALF tab BID - await refill request 

## 2019-11-10 NOTE — Patient Instructions (Addendum)
Thank you for coming to the office today.  For increased gas production  1. Try OTC Gas-X with meals and bedtime if needed only. 2. Can try probiotic next, goal to limit gas production by improving good bacteria, usually one a day. 3. Can increase Omeprazole (Prilosec) from 20 to 40 - 2 pills at once daily, if this works, let me know we can re order.  --------------------------------------  DUE for FASTING BLOOD WORK (no food or drink after midnight before the lab appointment, only water or coffee without cream/sugar on the morning of)  SCHEDULE "Lab Only" visit in the morning at the clinic for lab draw in 6 MONTHS   - Make sure Lab Only appointment is at about 1 week before your next appointment, so that results will be available  For Lab Results, once available within 2-3 days of blood draw, you can can log in to MyChart online to view your results and a brief explanation. Also, we can discuss results at next follow-up visit.    Please schedule a Follow-up Appointment to: Return in about 6 months (around 05/11/2020) for 6 month Yearly Medicare Checkup.  If you have any other questions or concerns, please feel free to call the office or send a message through [place]. You may also schedule an earlier appointment if necessary.  Additionally, you may be receiving a survey about your experience at our office within a few days to 1 week by e-mail or mail. We value your feedback.  Nobie Putnam, DO [place]

## 2019-11-10 NOTE — Assessment & Plan Note (Signed)
Controlled BP Complicated by CKD-III  Plan: 1. Continue current regimen - Amlodipine 5mg daily, Lisinopril 40mg daily, Metoprolol XL 100mg daily - May continue Lasix 30mg daily now per Nephrology - refilled 2. Encourage to stay active, low sodium diet, improve hydration with water 

## 2019-11-10 NOTE — Progress Notes (Addendum)
Subjective:    Patient ID: Brittney Simpson, female    DOB: 1927-08-18, 86 y.o.   MRN: [id number]  Brittney Simpson is a 86 y.o. female presenting on 11/10/2019 for Hypertension   HPI   History provided primarily by daughter, [name], primary caregiver. Patient provides additional history.  Specialist: Nephrology - Dr Lavonia Dana [facility]) Pain Management - Dr Milinda Pointer [facility]) Vascular - Dr Leotis Pain ([facility])  GERD / Gas Production Bloating Chronic problem. Controlled on Omeprazole 20mg  daily. Today, concerned with some issue of after eating feeling like has to burp but cannot, not every time but seems a little worse now. Not tried other OTC remedy. Seems to have more gas and bloating in general. Not having significant heartburn or abdominal pain. Denies nausea vomiting  CHRONIC HTN/ CKD-III Followed by Dr Kathryne Eriksson Nephrology q 6 months She is doing well lately Home readings recently fairly improved but has some chronic hard to control HTN Current Meds - Amlodipine 5mg  daily, Lisinopril 40mg  daily, Metoprolol XL 100mg  daily, Lasix 30mg  daily(taking regularly due to edema - daily) - Previous history there was concern from Nephrology that she had some retained fluid in chest/heart and they inc lasix from 20 to 30 with good results, have advised to keep that dose. Reports good compliance, took meds today. Tolerating well, w/o complaints. Admits edema ankles improved on lasix higher dose Denies CP, dyspnea, HA, dizziness / lightheadedness  Hypothyroidism Chronic problem. Taking Levothyroxine 15mcg daily currently. Last thyroid result was on 04/2019, was normal TSH and Free T4 normal  Chronic Low Back Pain, Lumbar DJD Spinal Stenosis / Facet DJD Followed by [facility] Management and [facility]See prior background information  ANXIETY / INSOMNIA Previous visit continued Trazodone 50mg  nightly for sleep and  chronic Clonazepam, see prior notes for background information. - Today patient reportsno new concerns. She is doing well. Anxiety is controlled. Sleeping well on medicine. - Due for refill Clonazepam -Denies any problems with withdrawal from BDZ, aware of risks of sedation and potential fall risk at age  Hyperlipidemia Elevated LDL cholesterol Prior concern for statins and side effects, declines to take.  Recurrent UTI On Bactrim antibiotic daily for prophylaxis UTI by Nephrology Taking Cipro PRN UTI flare up as advised, has one rx now to pick up and needed refills on file. Occasionally takes but not often. Doing well with this plan.   Health Maintenance:  Not UTD on COVID19 vaccine.   Depression screen [facility] 2/9 11/10/2019 05/12/2019 12/16/2018  Decreased Interest 0 0 0  Down, Depressed, Hopeless 0 0 0  PHQ - 2 Score 0 0 0  Altered sleeping - - 0  Tired, decreased energy - - 0  Change in appetite - - 0  Feeling bad or failure about yourself  - - 0  Trouble concentrating - - 0  Moving slowly or fidgety/restless - - 0  Suicidal thoughts - - 0  PHQ-9 Score - - 0  Difficult doing work/chores - - Not difficult at all    Social History   Tobacco Use  . Smoking status: Never Smoker  . Smokeless tobacco: Never Used  Substance Use Topics  . Alcohol use: No    Alcohol/week: 0.0 standard drinks  . Drug use: No    Review of Systems Per HPI unless specifically indicated above     Objective:    BP 134/61   Pulse (!) 57   Temp (!) 97.5 F (36.4  C) (Temporal)   Resp 16   Ht 5' 5.5" (1.664 m)   Wt 134 lb (60.8 kg)   SpO2 97%   BMI 21.96 kg/m   Wt Readings from Last 3 Encounters:  11/10/19 134 lb (60.8 kg)  10/26/19 134 lb (60.8 kg)  09/07/19 132 lb (59.9 kg)    Physical Exam Vitals and nursing note reviewed.  Constitutional:      General: She is not in acute distress.    Appearance: She is well-developed. She is not diaphoretic.     Comments: Well-appearing  thin elderly 86 year old female, comfortable, cooperative  HENT:     Head: Normocephalic and atraumatic.     Comments: Hard of hearing but able to follow conversation Eyes:     General:        Right eye: No discharge.        Left eye: No discharge.     Conjunctiva/sclera: Conjunctivae normal.  Neck:     Thyroid: No thyromegaly.  Cardiovascular:     Rate and Rhythm: Normal rate and regular rhythm.     Heart sounds: Normal heart sounds. No murmur.  Pulmonary:     Effort: Pulmonary effort is normal. No respiratory distress.     Breath sounds: Normal breath sounds. No wheezing or rales.  Abdominal:     General: Bowel sounds are normal. There is no distension.     Palpations: Abdomen is soft.     Tenderness: There is no abdominal tenderness.  Musculoskeletal:        General: Normal range of motion.     Cervical back: Normal range of motion and neck supple.     Right lower leg: Edema (trace, non pitting) present.     Left lower leg: Edema (trace non pitting) present.  Lymphadenopathy:     Cervical: No cervical adenopathy.  Skin:    General: Skin is warm and dry.     Findings: No erythema or rash.  Neurological:     Mental Status: She is alert and oriented to person, place, and time.  Psychiatric:        Behavior: Behavior normal.     Comments: Well groomed, good eye contact, normal speech and thoughts    Results for orders placed or performed in visit on 05/10/19  T4, free  Result Value Ref Range   Free T4 1.5 0.8 - 1.8 ng/dL  TSH  Result Value Ref Range   TSH 1.91 0.40 - 4.50 mIU/L  Lipid panel  Result Value Ref Range   Cholesterol 262 (H) <200 mg/dL   HDL 36 (L) > OR = 50 mg/dL   Triglycerides 201 (H) <150 mg/dL   LDL Cholesterol (Calc) 187 (H) mg/dL (calc)   Total CHOL/HDL Ratio 7.3 (H) <5.0 (calc)   Non-HDL Cholesterol (Calc) 226 (H) <130 mg/dL (calc)  COMPLETE METABOLIC PANEL WITH GFR  Result Value Ref Range   Glucose, Bld 99 65 - 99 mg/dL   BUN 11 7 - 25 mg/dL     Creat 0.97 (H) 0.60 - 0.88 mg/dL   GFR, Est Non African American 52 (L) > OR = 60 mL/min/1.79m2   GFR, Est African American 60 > OR = 60 mL/min/1.54m2   BUN/Creatinine Ratio 11 6 - 22 (calc)   Sodium 137 135 - 146 mmol/L   Potassium 4.0 3.5 - 5.3 mmol/L   Chloride 98 98 - 110 mmol/L   CO2 29 20 - 32 mmol/L   Calcium 9.7 8.6 - 10.4  mg/dL   Total Protein 7.1 6.1 - 8.1 g/dL   Albumin 4.1 3.6 - 5.1 g/dL   Globulin 3.0 1.9 - 3.7 g/dL (calc)   AG Ratio 1.4 1.0 - 2.5 (calc)   Total Bilirubin 0.4 0.2 - 1.2 mg/dL   Alkaline phosphatase (APISO) 62 37 - 153 U/L   AST 12 10 - 35 U/L   ALT 9 6 - 29 U/L  CBC with Differential/Platelet  Result Value Ref Range   WBC 5.3 3.8 - 10.8 Thousand/uL   RBC 4.69 3.80 - 5.10 Million/uL   Hemoglobin 12.7 11.7 - 15.5 g/dL   HCT 39.9 35.0 - 45.0 %   MCV 85.1 80.0 - 100.0 fL   MCH 27.1 27.0 - 33.0 pg   MCHC 31.8 (L) 32.0 - 36.0 g/dL   RDW 13.8 11.0 - 15.0 %   Platelets 204 140 - 400 Thousand/uL   MPV 10.6 7.5 - 12.5 fL   Neutro Abs 3,111 1,500 - 7,800 cells/uL   Lymphs Abs 1,675 850 - 3,900 cells/uL   Absolute Monocytes 461 200 - 950 cells/uL   Eosinophils Absolute 21 15 - 500 cells/uL   Basophils Absolute 32 0 - 200 cells/uL   Neutrophils Relative % 58.7 %   Total Lymphocyte 31.6 %   Monocytes Relative 8.7 %   Eosinophils Relative 0.4 %   Basophils Relative 0.6 %  Hemoglobin A1c  Result Value Ref Range   Hgb A1c MFr Bld 5.5 <5.7 % of total Hgb   Mean Plasma Glucose 111 (calc)   eAG (mmol/L) 6.2 (calc)      Assessment & Plan:   Problem List Items Addressed This Visit    Recurrent urinary tract infection    Stable without UTI recently Followed by Nephrology On bactrim prophylaxis daily  Reviewed awareness if early UTI - resolves usually with Cipro early with symptoms, Cipro 500mg  BID x 5 days - Refill Cipro today +2 refills, sent to pharmacy, discussed proper use      Relevant Medications   ciprofloxacin (CIPRO) 500 MG tablet    Hypothyroidism    Controlled Last lab normal 04/2019 Continue levo 161mcg daily Re-check in 6 months      Chronic anxiety    Stable, chronic anxiety, with associated insomnia Chronically on BDZ without history of abuse or dose increase Checked [place] CSRS without red flag  Plan: 1. Reviewed chronic BDZ use for anxiety, high risk sedation, fall, confusion, withdrawal risks - med tolerated very well, aware not to increase dose - Continue Clonazepam 0.5mg  HALF tab BID - await refill request      Benign hypertension with CKD (chronic kidney disease) stage III - Primary    Controlled BP Complicated by CKD-III  Plan: 1. Continue current regimen - Amlodipine 5mg  daily, Lisinopril 40mg  daily, Metoprolol XL 100mg  daily - May continue Lasix 30mg  daily now per Nephrology - refilled 2. Encourage to stay active, low sodium diet, improve hydration with water      Relevant Medications   lisinopril (ZESTRIL) 40 MG tablet    Other Visit Diagnoses    Functional bloating         #GERD / Bloating / inc gas Clinicaly benign but seems functional GI problem On PPI 20mg  omeprazole, seems to help, no active GERD / heartburn  1. Try OTC Gas-X with meals and bedtime if needed only. 2. Can try probiotic next, goal to limit gas production by improving good bacteria, usually one a day. 3. Can increase Omeprazole (Prilosec) from  20 to 40 - 2 pills at once daily, if this works, let me know we can re order   Meds ordered this encounter  Medications  . lisinopril (ZESTRIL) 40 MG tablet    Sig: Take 1 tablet (40 mg total) by mouth daily.    Dispense:  30 tablet    Refill:  5  . ciprofloxacin (CIPRO) 500 MG tablet    Sig: Take 1 tablet (500 mg total) by mouth 2 (two) times daily.    Dispense:  10 tablet    Refill:  2    Please keep on file for future fills      Follow up plan: Return in about 6 months (around 05/11/2020) for 6 month Yearly Medicare Checkup.  Future labs ordered for  04/2020   Nobie Putnam, [facility] 11/10/2019, 4:32 PM

## 2019-11-10 NOTE — Assessment & Plan Note (Signed)
Controlled Last lab normal 04/2019 Continue levo 168mcg daily Re-check in 6 months

## 2019-11-10 NOTE — Assessment & Plan Note (Signed)
Stable without UTI recently Followed by Nephrology On bactrim prophylaxis daily  Reviewed awareness if early UTI - resolves usually with Cipro early with symptoms, Cipro 500mg  BID x 5 days - Refill Cipro today +2 refills, sent to pharmacy, discussed proper use

## 2019-12-14 DIAGNOSIS — H401133 Primary open-angle glaucoma, bilateral, severe stage: Secondary | ICD-10-CM | POA: Diagnosis not present

## 2019-12-17 ENCOUNTER — Other Ambulatory Visit: Payer: Self-pay | Admitting: Nurse Practitioner

## 2019-12-17 DIAGNOSIS — N183 Chronic kidney disease, stage 3 unspecified: Secondary | ICD-10-CM

## 2019-12-17 DIAGNOSIS — I129 Hypertensive chronic kidney disease with stage 1 through stage 4 chronic kidney disease, or unspecified chronic kidney disease: Secondary | ICD-10-CM

## 2020-01-04 ENCOUNTER — Other Ambulatory Visit: Payer: Self-pay

## 2020-01-04 ENCOUNTER — Ambulatory Visit
Admission: RE | Admit: 2020-01-04 | Discharge: 2020-01-04 | Disposition: A | Discharge status: Home / Self Care | Payer: Medicare Other | Source: Ambulatory Visit | Attending: Pain Medicine | Admitting: Pain Medicine

## 2020-01-04 ENCOUNTER — Encounter: Payer: Self-pay | Admitting: Pain Medicine

## 2020-01-04 ENCOUNTER — Ambulatory Visit (HOSPITAL_BASED_OUTPATIENT_CLINIC_OR_DEPARTMENT_OTHER): Payer: Medicare Other | Admitting: Pain Medicine

## 2020-01-04 VITALS — BP 156/76 | HR 58 | Temp 97.3°F | Resp 15 | Ht 65.0 in | Wt 135.0 lb

## 2020-01-04 DIAGNOSIS — M47816 Spondylosis without myelopathy or radiculopathy, lumbar region: Secondary | ICD-10-CM

## 2020-01-04 DIAGNOSIS — M47817 Spondylosis without myelopathy or radiculopathy, lumbosacral region: Secondary | ICD-10-CM | POA: Insufficient documentation

## 2020-01-04 DIAGNOSIS — M545 Low back pain, unspecified: Secondary | ICD-10-CM

## 2020-01-04 DIAGNOSIS — G8929 Other chronic pain: Secondary | ICD-10-CM | POA: Insufficient documentation

## 2020-01-04 DIAGNOSIS — M25551 Pain in right hip: Secondary | ICD-10-CM | POA: Insufficient documentation

## 2020-01-04 DIAGNOSIS — M5137 Other intervertebral disc degeneration, lumbosacral region: Secondary | ICD-10-CM

## 2020-01-04 MED ORDER — ROPIVACAINE HCL 2 MG/ML IJ SOLN
18.0000 mL | Freq: Once | INTRAMUSCULAR | Status: AC
  Administered 2020-01-04: 18 mL via PERINEURAL
  Filled 2020-01-04: qty 20

## 2020-01-04 MED ORDER — TRIAMCINOLONE ACETONIDE 40 MG/ML IJ SUSP
80.0000 mg | Freq: Once | INTRAMUSCULAR | Status: AC
  Administered 2020-01-04: 80 mg
  Filled 2020-01-04: qty 2

## 2020-01-04 MED ORDER — LIDOCAINE HCL 2 % IJ SOLN
20.0000 mL | Freq: Once | INTRAMUSCULAR | Status: AC
  Administered 2020-01-04: 400 mg
  Filled 2020-01-04: qty 40

## 2020-01-04 NOTE — Progress Notes (Signed)
PROVIDER NOTE: Information contained herein reflects review and annotations entered in association with encounter. Interpretation of such information and data should be left to medically-trained personnel. Information provided to patient can be located elsewhere in the medical record under "Patient Instructions". Document created using STT-dictation technology, any transcriptional errors that may result from process are unintentional.    Patient: Brittney Simpson  Service Category: Procedure  Provider: Gaspar Cola, MD  DOB: [birth year]  DOS: 01/04/2020  Location: [facility]  MRN: [id number]  Setting: Ambulatory - outpatient  Referring Provider: Nobie Putnam *  Type: Established Patient  Specialty: Interventional Pain Management  PCP: Olin Hauser, DO   Primary Reason for Visit: Interventional Pain Management Treatment. CC: Back Pain (low)  Procedure:          Anesthesia, Analgesia, Anxiolysis:  Type: Lumbar Facet, Medial Branch Block(s)          Primary Purpose: Palliative Region: Posterolateral Lumbosacral Spine Level: L2, L3, L4, L5, & S1 Medial Branch Level(s). Injecting these levels blocks the L3-4, L4-5, and L5-S1 lumbar facet joints. Laterality: Bilateral  Type: Local Anesthesia Indication(s): Analgesia         Route: Infiltration (Jansen/IM) IV Access: Declined Sedation: Declined  Local Anesthetic: Lidocaine 1-2%  Position: Prone   Indications: 1. Lumbar facet syndrome (Bilateral) (R>L)   2. Spondylosis without myelopathy or radiculopathy, lumbosacral region   3. Lumbar facet hypertrophy   4. DDD (degenerative disc disease), lumbosacral   5. Chronic low back pain (Primary Area of Pain) (Bilateral) (R>L)   6. Chronic hip pain (Secondary area of Pain) (Right)    Pain Score: Pre-procedure: 8 /10 Post-procedure: 0-No pain/10   Pre-op Assessment:  Brittney Simpson is a 86 y.o. (year old), female patient, seen today for interventional  treatment. She  has a past surgical history that includes Hernia repair; Hernia repair; Hemorrhoid surgery; and Kyphoplasty (N/A, 01/23/2017). Brittney Simpson has a current medication list which includes the following prescription(s): acetaminophen, amlodipine, brimonidine, clonazepam, cranberry, vitamin d2, furosemide, latanoprost, levothyroxine, lisinopril, metoprolol succinate, omeprazole, sulfamethoxazole-trimethoprim, timolol, tramadol, trazodone, and ciprofloxacin. Her primarily concern today is the Back Pain (low)  Initial Vital Signs:  Pulse/HCG Rate: (!) 58ECG Heart Rate: 66 Temp: (!) 97.3 F (36.3 C) Resp: 16 BP: (!) 147/71 SpO2: 97 %  BMI: Estimated body mass index is 22.47 kg/m as calculated from the following:   Height as of this encounter: 5\' 5"  (1.651 m).   Weight as of this encounter: 135 lb (61.2 kg).  Risk Assessment: Allergies: Reviewed. She has No Known Allergies.  Allergy Precautions: None required Coagulopathies: Reviewed. None identified.  Blood-thinner therapy: None at this time Active Infection(s): Reviewed. None identified. Brittney Simpson is afebrile  Site Confirmation: Brittney Simpson was asked to confirm the procedure and laterality before marking the site Procedure checklist: Completed Consent: Before the procedure and under the influence of no sedative(s), amnesic(s), or anxiolytics, the patient was informed of the treatment options, risks and possible complications. To fulfill our ethical and legal obligations, as recommended by the American Medical Association's Code of Ethics, I have informed the patient of my clinical impression; the nature and purpose of the treatment or procedure; the risks, benefits, and possible complications of the intervention; the alternatives, including doing nothing; the risk(s) and benefit(s) of the alternative treatment(s) or procedure(s); and the risk(s) and benefit(s) of doing nothing. The patient was provided information about the  general risks and possible complications associated with the procedure. These may include, but are  not limited to: failure to achieve desired goals, infection, bleeding, organ or nerve damage, allergic reactions, paralysis, and death. In addition, the patient was informed of those risks and complications associated to Spine-related procedures, such as failure to decrease pain; infection (i.e.: Meningitis, epidural or intraspinal abscess); bleeding (i.e.: epidural hematoma, subarachnoid hemorrhage, or any other type of intraspinal or peri-dural bleeding); organ or nerve damage (i.e.: Any type of peripheral nerve, nerve root, or spinal cord injury) with subsequent damage to sensory, motor, and/or autonomic systems, resulting in permanent pain, numbness, and/or weakness of one or several areas of the body; allergic reactions; (i.e.: anaphylactic reaction); and/or death. Furthermore, the patient was informed of those risks and complications associated with the medications. These include, but are not limited to: allergic reactions (i.e.: anaphylactic or anaphylactoid reaction(s)); adrenal axis suppression; blood sugar elevation that in diabetics may result in ketoacidosis or comma; water retention that in patients with history of congestive heart failure may result in shortness of breath, pulmonary edema, and decompensation with resultant heart failure; weight gain; swelling or edema; medication-induced neural toxicity; particulate matter embolism and blood vessel occlusion with resultant organ, and/or nervous system infarction; and/or aseptic necrosis of one or more joints. Finally, the patient was informed that Medicine is not an exact science; therefore, there is also the possibility of unforeseen or unpredictable risks and/or possible complications that may result in a catastrophic outcome. The patient indicated having understood very clearly. We have given the patient no guarantees and we have made no promises.  Enough time was given to the patient to ask questions, all of which were answered to the patient's satisfaction. Brittney Simpson has indicated that she wanted to continue with the procedure. Attestation: I, the ordering provider, attest that I have discussed with the patient the benefits, risks, side-effects, alternatives, likelihood of achieving goals, and potential problems during recovery for the procedure that I have provided informed consent. Date  Time: 01/04/2020  8:43 AM  Pre-Procedure Preparation:  Monitoring: As per clinic protocol. Respiration, ETCO2, SpO2, BP, heart rate and rhythm monitor placed and checked for adequate function Safety Precautions: Patient was assessed for positional comfort and pressure points before starting the procedure. Time-out: I initiated and conducted the "Time-out" before starting the procedure, as per protocol. The patient was asked to participate by confirming the accuracy of the "Time Out" information. Verification of the correct person, site, and procedure were performed and confirmed by me, the nursing staff, and the patient. "Time-out" conducted as per Joint Commission's Universal Protocol (UP.01.01.01). Time: 0903  Description of Procedure:          Laterality: Bilateral. The procedure was performed in identical fashion on both sides. Levels:  L2, L3, L4, L5, & S1 Medial Branch Level(s) Area Prepped: Posterior Lumbosacral Region DuraPrep (Iodine Povacrylex [0.7% available iodine] and Isopropyl Alcohol, 74% w/w) Safety Precautions: Aspiration looking for blood return was conducted prior to all injections. At no point did we inject any substances, as a needle was being advanced. Before injecting, the patient was told to immediately notify me if she was experiencing any new onset of "ringing in the ears, or metallic taste in the mouth". No attempts were made at seeking any paresthesias. Safe injection practices and needle disposal techniques used. Medications  properly checked for expiration dates. SDV (single dose vial) medications used. After the completion of the procedure, all disposable equipment used was discarded in the proper designated medical waste containers. Local Anesthesia: Protocol guidelines were followed. The patient was positioned over  the fluoroscopy table. The area was prepped in the usual manner. The time-out was completed. The target area was identified using fluoroscopy. A 12-in long, straight, sterile hemostat was used with fluoroscopic guidance to locate the targets for each level blocked. Once located, the skin was marked with an approved surgical skin marker. Once all sites were marked, the skin (epidermis, dermis, and hypodermis), as well as deeper tissues (fat, connective tissue and muscle) were infiltrated with a small amount of a short-acting local anesthetic, loaded on a 10cc syringe with a 25G, 1.5-in  Needle. An appropriate amount of time was allowed for local anesthetics to take effect before proceeding to the next step. Local Anesthetic: Lidocaine 2.0% The unused portion of the local anesthetic was discarded in the proper designated containers. Technical explanation of process:  L2 Medial Branch Nerve Block (MBB): The target area for the L2 medial branch is at the junction of the postero-lateral aspect of the superior articular process and the superior, posterior, and medial edge of the transverse process of L3. Under fluoroscopic guidance, a Quincke needle was inserted until contact was made with os over the superior postero-lateral aspect of the pedicular shadow (target area). After negative aspiration for blood, 2.0 mL of the nerve block solution was injected without difficulty or complication. The needle was removed intact. L3 Medial Branch Nerve Block (MBB): The target area for the L3 medial branch is at the junction of the postero-lateral aspect of the superior articular process and the superior, posterior, and medial edge of  the transverse process of L4. Under fluoroscopic guidance, a Quincke needle was inserted until contact was made with os over the superior postero-lateral aspect of the pedicular shadow (target area). After negative aspiration for blood, 2.0 mL of the nerve block solution was injected without difficulty or complication. The needle was removed intact. L4 Medial Branch Nerve Block (MBB): The target area for the L4 medial branch is at the junction of the postero-lateral aspect of the superior articular process and the superior, posterior, and medial edge of the transverse process of L5. Under fluoroscopic guidance, a Quincke needle was inserted until contact was made with os over the superior postero-lateral aspect of the pedicular shadow (target area). After negative aspiration for blood, 2.0 mL of the nerve block solution was injected without difficulty or complication. The needle was removed intact. L5 Medial Branch Nerve Block (MBB): The target area for the L5 medial branch is at the junction of the postero-lateral aspect of the superior articular process and the superior, posterior, and medial edge of the sacral ala. Under fluoroscopic guidance, a Quincke needle was inserted until contact was made with os over the superior postero-lateral aspect of the pedicular shadow (target area). After negative aspiration for blood, 2.0 mL of the nerve block solution was injected without difficulty or complication. The needle was removed intact. S1 Medial Branch Nerve Block (MBB): The target area for the S1 medial branch is at the posterior and inferior 6 o'clock position of the L5-S1 facet joint. Under fluoroscopic guidance, the Quincke needle inserted for the L5 MBB was redirected until contact was made with os over the inferior and postero aspect of the sacrum, at the 6 o' clock position under the L5-S1 facet joint (Target area). After negative aspiration for blood, 2.0 mL of the nerve block solution was injected without  difficulty or complication. The needle was removed intact.  Nerve block solution: 0.2% PF-Ropivacaine + Triamcinolone (40 mg/mL) diluted to a final concentration of 4 mg  of Triamcinolone/mL of Ropivacaine The unused portion of the solution was discarded in the proper designated containers. Procedural Needles: 22-gauge, 3.5-inch, Quincke needles used for all levels.  Once the entire procedure was completed, the treated area was cleaned, making sure to leave some of the prepping solution back to take advantage of its long term bactericidal properties.   Illustration of the posterior view of the lumbar spine and the posterior neural structures. Laminae of L2 through S1 are labeled. DPRL5, dorsal primary ramus of L5; DPRS1, dorsal primary ramus of S1; DPR3, dorsal primary ramus of L3; FJ, facet (zygapophyseal) joint L3-L4; I, inferior articular process of L4; LB1, lateral branch of dorsal primary ramus of L1; IAB, inferior articular branches from L3 medial branch (supplies L4-L5 facet joint); IBP, intermediate branch plexus; MB3, medial branch of dorsal primary ramus of L3; NR3, third lumbar nerve root; S, superior articular process of L5; SAB, superior articular branches from L4 (supplies L4-5 facet joint also); TP3, transverse process of L3.  Vitals:   01/04/20 0859 01/04/20 0903 01/04/20 0908 01/04/20 0912  BP: (!) 186/78 (!) 171/79 (!) 163/78 (!) 156/76  Pulse:      Resp: 13 13 12 15   Temp:      SpO2: 97% 97% 96% 96%  Weight:      Height:         Start Time: 0903 hrs. End Time: 0911 hrs.  Imaging Guidance (Spinal):          Type of Imaging Technique: Fluoroscopy Guidance (Spinal) Indication(s): Assistance in needle guidance and placement for procedures requiring needle placement in or near specific anatomical locations not easily accessible without such assistance. Exposure Time: Please see nurses notes. Contrast: None used. Fluoroscopic Guidance: I was personally present during the use  of fluoroscopy. "Tunnel Vision Technique" used to obtain the best possible view of the target area. Parallax error corrected before commencing the procedure. "Direction-depth-direction" technique used to introduce the needle under continuous pulsed fluoroscopy. Once target was reached, antero-posterior, oblique, and lateral fluoroscopic projection used confirm needle placement in all planes. Images permanently stored in EMR. Interpretation: No contrast injected. I personally interpreted the imaging intraoperatively. Adequate needle placement confirmed in multiple planes. Permanent images saved into the patient's record.  Antibiotic Prophylaxis:   Anti-infectives (From admission, onward)   None     Indication(s): None identified  Post-operative Assessment:  Post-procedure Vital Signs:  Pulse/HCG Rate: (!) 5871 Temp: (!) 97.3 F (36.3 C) Resp: 15 BP: (!) 156/76 SpO2: 96 %  EBL: None  Complications: No immediate post-treatment complications observed by team, or reported by patient.  Note: The patient tolerated the entire procedure well. A repeat set of vitals were taken after the procedure and the patient was kept under observation following institutional policy, for this type of procedure. Post-procedural neurological assessment was performed, showing return to baseline, prior to discharge. The patient was provided with post-procedure discharge instructions, including a section on how to identify potential problems. Should any problems arise concerning this procedure, the patient was given instructions to immediately contact us, at any time, without hesitation. In any case, we plan to contact the patient by telephone for a follow-up status report regarding this interventional procedure.  Comments:  No additional relevant information.  Plan of Care  Orders:  Orders Placed This Encounter  Procedures  . LUMBAR FACET(MEDIAL BRANCH NERVE BLOCK) MBNB    Scheduling Instructions:      Procedure: Lumbar facet block (AKA.: Lumbosacral medial branch nerve block)  Side: Bilateral     Level: L3-4, L4-5, & L5-S1 Facets (L2, L3, L4, L5, & S1 Medial Branch Nerves)     Sedation: No Sedation.     Timeframe: Today    Order Specific Question:   Where will this procedure be performed?    Answer:   [facility]  . DG PA[facility] C-ARM 1-60 MIN NO REPORT    Intraoperative interpretation by procedural physician at [place].    Standing Status:   Standing    Number of Occurrences:   1    Order Specific Question:   Reason for exam:    Answer:   Assistance in needle guidance and placement for procedures requiring needle placement in or near specific anatomical locations not easily accessible without such assistance.  . Informed Consent Details: Physician/Practitioner Attestation; Transcribe to consent form and obtain patient signature    Nursing Order: Transcribe to consent form and obtain patient signature. Note: Always confirm laterality of pain with Ms. Latimore, before procedure. Procedure: Lumbar Facet Block  under fluoroscopic guidance Indication/Reason: Low Back Pain, with our without leg pain, due to Facet Joint Arthralgia (Joint Pain) known as Lumbar Facet Syndrome, secondary to Lumbar, and/or Lumbosacral Spondylosis (Arthritis of the Spine), without myelopathy or radiculopathy (Nerve Damage). Provider Attestation: I, [place] Dossie Arbour, MD, (Pain Management Specialist), the physician/practitioner, attest that I have discussed with the patient the benefits, risks, side effects, alternatives, likelihood of achieving goals and potential problems during recovery for the procedure that I have provided informed consent.  . Provide equipment / supplies at bedside    Equipment required: Single use, disposable, "Block Tray"    Standing Status:   Standing    Number of Occurrences:   1    Order Specific Question:   Specify    Answer:   Block Tray   Chronic Opioid  Analgesic:  Tramadol 50 mg, 1 tab PO q 8 hrs (150 mg/day of tramadol) (should have enough to last until 11/01/2019) MME/day: 15 mg/day.   Medications ordered for procedure: Meds ordered this encounter  Medications  . lidocaine (XYLOCAINE) 2 % (with pres) injection 400 mg  . ropivacaine (PF) 2 mg/mL (0.2%) (NAROPIN) injection 18 mL  . triamcinolone acetonide (KENALOG-40) injection 80 mg   Medications administered: We administered lidocaine, ropivacaine (PF) 2 mg/mL (0.2%), and triamcinolone acetonide.  See the medical record for exact dosing, route, and time of administration.  Follow-up plan:   Return in about 2 weeks (around 01/18/2020) for (VV), (PP).       Interventional management options:  Considering: Possible bilateral lumbar facet RFA. (Offerred several times. Pt. chooses to hold.) Diagnostic right hip joint injection Diagnostic right L3 TFESI Diagnostic right trochanteric bursa injection Diagnostic right SI joint block Diagnostic midline caudal ESI (for the tailbone pain)   Palliative PRN treatment(s): Palliativeright lumbar facet block #12 Palliativeleft lumbar facet block #10 Palliative right L3-4 LESI     Recent Visits Date Type Provider Dept  10/27/19 Telemedicine Milinda Pointer, MD [facility]  Showing recent visits within past 90 days and meeting all other requirements   Today's Visits Date Type Provider Dept  01/04/20 Procedure visit Milinda Pointer, MD [facility]  Showing today's visits and meeting all other requirements   Future Appointments Date Type Provider Dept  01/19/20 Appointment Milinda Pointer, MD [facility]  Showing future appointments within next 90 days and meeting all other requirements   Disposition: Discharge home  Discharge (Date  Time): 01/04/2020;  0921 hrs.   Primary Care Physician: Olin Hauser, DO Location: [facility] Note by: Gaspar Cola, MD Date: 01/04/2020; Time: 9:28 AM  Disclaimer:  Medicine is not an Chief Strategy Officer. The only guarantee in medicine is that nothing is guaranteed. It is important to note that the decision to proceed with this intervention was based on the information collected from the patient. The Data and conclusions were drawn from the patient's questionnaire, the interview, and the physical examination. Because the information was provided in large part by the patient, it cannot be guaranteed that it has not been purposely or unconsciously manipulated. Every effort has been made to obtain as much relevant data as possible for this evaluation. It is important to note that the conclusions that lead to this procedure are derived in large part from the available data. Always take into account that the treatment will also be dependent on availability of resources and existing treatment guidelines, considered by other Pain Management Practitioners as being common knowledge and practice, at the time of the intervention. For Medico-Legal purposes, it is also important to point out that variation in procedural techniques and pharmacological choices are the acceptable norm. The indications, contraindications, technique, and results of the above procedure should only be interpreted and judged by a Board-Certified Interventional Pain Specialist with extensive familiarity and expertise in the same exact procedure and technique.

## 2020-01-04 NOTE — Progress Notes (Signed)
Safety precautions to be maintained throughout the outpatient stay will include: orient to surroundings, keep bed in low position, maintain call bell within reach at all times, provide assistance with transfer out of bed and ambulation.  

## 2020-01-04 NOTE — Patient Instructions (Signed)

## 2020-01-04 NOTE — Progress Notes (Deleted)
Safety precautions to be maintained throughout the outpatient stay will include: orient to surroundings, keep bed in low position, maintain call bell within reach at all times, provide assistance with transfer out of bed and ambulation.  

## 2020-01-05 ENCOUNTER — Telehealth: Payer: Self-pay | Admitting: *Deleted

## 2020-01-05 NOTE — Telephone Encounter (Signed)
Attempted to call for post procedure follow-up. Message left. 

## 2020-01-15 ENCOUNTER — Emergency Department
Admission: EM | Admit: 2020-01-15 | Discharge: 2020-01-15 | Disposition: A | Discharge status: Home / Self Care | Payer: Medicare Other | Attending: Emergency Medicine | Admitting: Emergency Medicine

## 2020-01-15 DIAGNOSIS — T25121A Burn of first degree of right foot, initial encounter: Secondary | ICD-10-CM | POA: Diagnosis not present

## 2020-01-15 DIAGNOSIS — X12XXXA Contact with other hot fluids, initial encounter: Secondary | ICD-10-CM | POA: Insufficient documentation

## 2020-01-15 DIAGNOSIS — Y999 Unspecified external cause status: Secondary | ICD-10-CM | POA: Diagnosis not present

## 2020-01-15 DIAGNOSIS — T22231A Burn of second degree of right upper arm, initial encounter: Secondary | ICD-10-CM | POA: Diagnosis present

## 2020-01-15 DIAGNOSIS — N183 Chronic kidney disease, stage 3 unspecified: Secondary | ICD-10-CM | POA: Diagnosis not present

## 2020-01-15 DIAGNOSIS — Z23 Encounter for immunization: Secondary | ICD-10-CM | POA: Insufficient documentation

## 2020-01-15 DIAGNOSIS — Y929 Unspecified place or not applicable: Secondary | ICD-10-CM | POA: Insufficient documentation

## 2020-01-15 DIAGNOSIS — I129 Hypertensive chronic kidney disease with stage 1 through stage 4 chronic kidney disease, or unspecified chronic kidney disease: Secondary | ICD-10-CM | POA: Insufficient documentation

## 2020-01-15 DIAGNOSIS — Y9389 Activity, other specified: Secondary | ICD-10-CM | POA: Diagnosis not present

## 2020-01-15 MED ORDER — BACITRACIN-NEOMYCIN-POLYMYXIN 400-5-5000 EX OINT
TOPICAL_OINTMENT | CUTANEOUS | Status: AC
  Filled 2020-01-15: qty 3

## 2020-01-15 MED ORDER — BACITRACIN-NEOMYCIN-POLYMYXIN 400-5-5000 EX OINT
TOPICAL_OINTMENT | CUTANEOUS | Status: AC
  Filled 2020-01-15: qty 1

## 2020-01-15 MED ORDER — TETANUS-DIPHTH-ACELL PERTUSSIS 5-2.5-18.5 LF-MCG/0.5 IM SUSP
0.5000 mL | Freq: Once | INTRAMUSCULAR | Status: AC
  Administered 2020-01-15: 0.5 mL via INTRAMUSCULAR
  Filled 2020-01-15: qty 0.5

## 2020-01-15 MED ORDER — ACETAMINOPHEN 500 MG PO TABS
1000.0000 mg | ORAL_TABLET | Freq: Once | ORAL | Status: AC
  Administered 2020-01-15: 1000 mg via ORAL
  Filled 2020-01-15: qty 2
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
## 2020-01-17 NOTE — Progress Notes (Deleted)
Patient: Brittney Simpson  Service Category: E/M  Provider: Gaspar Cola, MD  DOB: April 21, 1928  DOS: 01/19/2020  Location: Office  MRN: [id number]  Setting: Ambulatory outpatient  Referring Provider: Nobie Putnam *  Type: Established Patient  Specialty: Interventional Pain Management  PCP: Olin Hauser, DO  Location: Remote location  Delivery: TeleHealth     Virtual Encounter - Pain Management PROVIDER NOTE: Information contained herein reflects review and annotations entered in association with encounter. Interpretation of such information and data should be left to medically-trained personnel. Information provided to patient can be located elsewhere in the medical record under "Patient Instructions". Document created using STT-dictation technology, any transcriptional errors that may result from process are unintentional.    Contact & Pharmacy Preferred: [place]: [phone] (home) Mobile: [phone] (mobile) E-mail: No e-mail address on record  [address] [place], [address] [place]. [place] [address] Phone: [phone] Fax: [phone]   Pre-screening  Brittney Simpson offered "in-person" vs "virtual" encounter. She indicated preferring virtual for this encounter.   Reason COVID-19*  Social distancing based on CDC and AMA recommendations.   I contacted Brittney Simpson on 01/19/2020 via telephone.      I clearly identified myself as Gaspar Cola, MD. I verified that I was speaking with the correct person using two identifiers (Name: Brittney Simpson, and date of birth: 1927-09-07).  Consent I sought verbal advanced consent from Brittney Simpson for virtual visit interactions. I informed Brittney Simpson of possible security and privacy concerns, risks, and limitations associated with providing "not-in-person" medical evaluation and management services. I also informed Brittney Simpson of the availability of "in-person"  appointments. Finally, I informed her that there would be a charge for the virtual visit and that she could be  personally, fully or partially, financially responsible for it. Brittney Simpson expressed understanding and agreed to proceed.   Historic Elements   Brittney Simpson is a 86 y.o. year old, female patient evaluated today after her last contact with our practice on 01/05/2020. Brittney Simpson  has a past medical history of Anxiety, Back ache, Hiatal hernia, Hyperlipidemia, Hypertension, Hypothyroid, Osteopenia, Thyroid disease, and UTI (urinary tract infection). She also  has a past surgical history that includes Hernia repair; Hernia repair; Hemorrhoid surgery; and Kyphoplasty (N/A, 01/23/2017). Brittney Simpson has a current medication list which includes the following prescription(s): acetaminophen, amlodipine, brimonidine, ciprofloxacin, clonazepam, cranberry, vitamin d2, furosemide, latanoprost, levothyroxine, lisinopril, metoprolol succinate, omeprazole, sulfamethoxazole-trimethoprim, timolol, tramadol, and trazodone. She  reports that she has never smoked. She has never used smokeless tobacco. She reports that she does not drink alcohol and does not use drugs. Brittney Simpson has No Known Allergies.   HPI  Today, she is being contacted for  ***   Pharmacotherapy Assessment  Analgesic: Tramadol 50 mg, 1 tab PO q 8 hrs (150 mg/day of tramadol) (should have enough to last until 11/01/2019) MME/day: 15 mg/day.   Monitoring: [place] PMP: PDMP reviewed during this encounter.       Pharmacotherapy: No side-effects or adverse reactions reported. Compliance: No problems identified. Effectiveness: Clinically acceptable. Plan: Refer to "POC".  UDS:  Summary  Date Value Ref Range Status  10/28/2017 FINAL  Final    Comment:    ==================================================================== TOXASSURE SELECT 13 (MW) ==================================================================== Test  Result       Flag       Units Drug Present and Declared for Prescription Verification   Tramadol                       >20000       EXPECTED   ng/mg creat   O-Desmethyltramadol            >20000       EXPECTED   ng/mg creat   N-Desmethyltramadol            8244         EXPECTED   ng/mg creat    Source of tramadol is a prescription medication.    O-desmethyltramadol and N-desmethyltramadol are expected    metabolites of tramadol. Drug Present not Declared for Prescription Verification   7-aminoclonazepam              248          UNEXPECTED ng/mg creat    7-aminoclonazepam is an expected metabolite of clonazepam. Source    of clonazepam is a scheduled prescription medication. ==================================================================== Test                      Result    Flag   Units      Ref Range   Creatinine              25               mg/dL      >=20 ==================================================================== Declared Medications:  The flagging and interpretation on this report are based on the  following declared medications.  Unexpected results may arise from  inaccuracies in the declared medications.  **Note: The testing scope of this panel includes these medications:  Tramadol  **Note: The testing scope of this panel does not include following  reported medications:  Acetaminophen  Amlodipine  Brimonidine Tartrate  Cranberry Extract  Furosemide  Latanoprost  Levothyroxine  Lidocaine  Lisinopril  Metoprolol  Omeprazole  Timolol  Trazodone  Vitamin D2 ==================================================================== For clinical consultation, please call [phone]. ====================================================================     Laboratory Chemistry Profile   Renal Lab Results  Component Value Date   BUN 11 05/10/2019   CREATININE 0.97 (H) 05/10/2019   BCR 11 05/10/2019   GFRAA 60 05/10/2019   GFRNONAA 52 (L)  05/10/2019     Hepatic Lab Results  Component Value Date   AST 12 05/10/2019   ALT 9 05/10/2019   ALBUMIN 3.6 11/22/2016   ALKPHOS 59 11/22/2016   LIPASE 21 06/14/2016     Electrolytes Lab Results  Component Value Date   NA 137 05/10/2019   K 4.0 05/10/2019   CL 98 05/10/2019   CALCIUM 9.7 05/10/2019   MG 2.1 01/14/2014     Bone No results found for: VD25OH, VD125OH2TOT, WG9562ZH0, QM5784ON6, 25OHVITD1, 25OHVITD2, 25OHVITD3, TESTOFREE, TESTOSTERONE   Inflammation (CRP: Acute Phase) (ESR: Chronic Phase) No results found for: CRP, ESRSEDRATE, LATICACIDVEN     Note: Above Lab results reviewed.   Imaging  DG PAIN CLINIC C-ARM 1-60 MIN NO REPORT Fluoro was used, but no Radiologist interpretation will be provided.  Please refer to "NOTES" tab for provider progress note.  Assessment  There were no encounter diagnoses.  Plan of Care  Problem-specific:  No problem-specific Assessment & Plan notes found for this encounter.  Brittney Simpson has a current medication list which includes the following long-term  medication(s): amlodipine, clonazepam, furosemide, levothyroxine, lisinopril, metoprolol succinate, omeprazole, tramadol, and trazodone.  Pharmacotherapy (Medications Ordered): No orders of the defined types were placed in this encounter.  Orders:  No orders of the defined types were placed in this encounter.  Follow-up plan:   No follow-ups on file.      Interventional management options:  Considering: Possible bilateral lumbar facet RFA. (Offerred several times. Pt. chooses to hold.) Diagnostic right hip joint injection Diagnostic right L3 TFESI Diagnostic right trochanteric bursa injection Diagnostic right SI joint block Diagnostic midline caudal ESI (for the tailbone pain)   Palliative PRN treatment(s): Palliativeright lumbar facet block #12 Palliativeleft lumbar facet block #10 Palliative right L3-4 LESI      Recent Visits Date  Type Provider Dept  01/04/20 Procedure visit Milinda Pointer, MD [facility]  10/27/19 Telemedicine Milinda Pointer, MD [facility]  Showing recent visits within past 90 days and meeting all other requirements Future Appointments Date Type Provider Dept  01/19/20 Appointment Milinda Pointer, MD [facility]  Showing future appointments within next 90 days and meeting all other requirements  I discussed the assessment and treatment plan with the patient. The patient was provided an opportunity to ask questions and all were answered. The patient agreed with the plan and demonstrated an understanding of the instructions.  Patient advised to call back or seek an in-person evaluation if the symptoms or condition worsens.  Duration of encounter: *** minutes.  Note by: Gaspar Cola, MD Date: 01/19/2020; Time: 10:24 AM

## 2020-01-18 ENCOUNTER — Encounter: Payer: Self-pay | Admitting: Family Medicine

## 2020-01-18 ENCOUNTER — Telehealth: Payer: Self-pay

## 2020-01-18 ENCOUNTER — Ambulatory Visit (INDEPENDENT_AMBULATORY_CARE_PROVIDER_SITE_OTHER): Payer: Medicare Other | Admitting: Family Medicine

## 2020-01-18 ENCOUNTER — Other Ambulatory Visit: Payer: Self-pay | Admitting: Pain Medicine

## 2020-01-18 ENCOUNTER — Ambulatory Visit
Admission: RE | Admit: 2020-01-18 | Discharge: 2020-01-18 | Disposition: A | Discharge status: Home / Self Care | Payer: Medicare Other | Attending: Pain Medicine | Admitting: Pain Medicine

## 2020-01-18 ENCOUNTER — Telehealth: Payer: Self-pay | Admitting: Pain Medicine

## 2020-01-18 ENCOUNTER — Ambulatory Visit
Admission: RE | Admit: 2020-01-18 | Discharge: 2020-01-18 | Disposition: A | Discharge status: Home / Self Care | Payer: Medicare Other | Source: Ambulatory Visit | Attending: Pain Medicine | Admitting: Pain Medicine

## 2020-01-18 ENCOUNTER — Other Ambulatory Visit: Payer: Self-pay | Admitting: Family Medicine

## 2020-01-18 ENCOUNTER — Other Ambulatory Visit: Payer: Self-pay

## 2020-01-18 VITALS — BP 152/70 | HR 69 | Temp 97.3°F | Resp 16 | Ht 65.5 in | Wt 127.0 lb

## 2020-01-18 DIAGNOSIS — M4854XS Collapsed vertebra, not elsewhere classified, thoracic region, sequela of fracture: Secondary | ICD-10-CM

## 2020-01-18 DIAGNOSIS — S22080S Wedge compression fracture of T11-T12 vertebra, sequela: Secondary | ICD-10-CM

## 2020-01-18 DIAGNOSIS — T22291A Burn of second degree of multiple sites of right shoulder and upper limb, except wrist and hand, initial encounter: Secondary | ICD-10-CM

## 2020-01-18 DIAGNOSIS — T25111A Burn of first degree of right ankle, initial encounter: Secondary | ICD-10-CM | POA: Diagnosis not present

## 2020-01-18 DIAGNOSIS — M8000XS Age-related osteoporosis with current pathological fracture, unspecified site, sequela: Secondary | ICD-10-CM | POA: Insufficient documentation

## 2020-01-18 DIAGNOSIS — K219 Gastro-esophageal reflux disease without esophagitis: Secondary | ICD-10-CM

## 2020-01-18 DIAGNOSIS — G8929 Other chronic pain: Secondary | ICD-10-CM | POA: Insufficient documentation

## 2020-01-18 DIAGNOSIS — F419 Anxiety disorder, unspecified: Secondary | ICD-10-CM

## 2020-01-18 DIAGNOSIS — M545 Low back pain, unspecified: Secondary | ICD-10-CM | POA: Insufficient documentation

## 2020-01-18 DIAGNOSIS — M549 Dorsalgia, unspecified: Secondary | ICD-10-CM | POA: Insufficient documentation

## 2020-01-18 MED ORDER — OMEPRAZOLE 20 MG PO CPDR: 20.0000 mg | DELAYED_RELEASE_CAPSULE | Freq: Every day | ORAL | 1 refills | Status: DC

## 2020-01-18 MED ORDER — SILVER SULFADIAZINE 1 % EX CREA: 1.0000 "application " | TOPICAL_CREAM | Freq: Every day | CUTANEOUS | 0 refills | Status: DC

## 2020-01-18 MED ORDER — CLONAZEPAM 0.5 MG PO TABS: 0.2500 mg | ORAL_TABLET | Freq: Two times a day (BID) | ORAL | 2 refills | Status: DC
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
## 2020-01-18 NOTE — Telephone Encounter (Signed)
She had her xrays done today. They should be ready soon.

## 2020-01-18 NOTE — Telephone Encounter (Signed)
ok 

## 2020-01-18 NOTE — Progress Notes (Signed)
[name] called indicating that she was having an acute flareup of her low back pain.  She does have a prior history of vertebral body fractures with osteoporosis.  At age 86, this is my primary concern.  She already has a kyphoplasty that was done at T12 by Dr. Rudene Christians.

## 2020-01-18 NOTE — Patient Instructions (Addendum)
Thank you for coming to the office today.  Start Silvadene cream daily for 2 weeks to help burns heal.  Still recommend vaseline / neosporin ointment 1-2 times a day as well to protect skin with each bandage dressing change.  No sign of spreading infection  Wounds are healing well it will take several weeks approximately.  Right upper arm large spot - non adherent pad with ointment first - (first 1-2 weeks can use topical Silvadene burn cream rx - once daily) place non adherent pad with ointment on the wound - then use gauze wrap then finish with the brown stretchy coban wrap and tape.  Right forearm smaller burn can use topical neosporin ointment only (or if preferred if still not healing can use the Silvadene burn cream. Keep open air.  Right ankle, no ointment or treatments needed, will heal on its own. If you prefer can use topical ointment if needed.  Keep avoiding sticking of gauze. Try to use non adherent pads and wrap and cover.  Use soap and water gently if need to cleanse.  Will refill CLonazepam and Omeprazole at end of day.  Please schedule a Follow-up Appointment to: Return in about 4 weeks (around 02/15/2020), or if symptoms worsen or fail to improve, for burn if not improved.  If you have any other questions or concerns, please feel free to call the office or send a message through [facility]. You may also schedule an earlier appointment if necessary.  Additionally, you may be receiving a survey about your experience at our office within a few days to 1 week by e-mail or mail. We value your feedback.  Nobie Putnam, DO [place]

## 2020-01-19 ENCOUNTER — Telehealth: Payer: Medicare Other | Admitting: Pain Medicine

## 2020-01-19 ENCOUNTER — Telehealth: Payer: Self-pay

## 2020-01-19 ENCOUNTER — Telehealth: Payer: Self-pay | Admitting: *Deleted

## 2020-01-19 NOTE — Telephone Encounter (Signed)
Called back to patient's daughter to convey what Dr Dossie Arbour interpreteted from xray images.  There is no completed report available but the images reflect T12 kyphoplasty collapse.  Recommendation that she reach out to Dr Rudene Christians or Dellia Cloud go to ED so that Neurosurgeon can be contacted.  Arbie Cookey states she is going to reach out to Dr Rudene Christians office since he is the provider the performed the last kyphoplasty.

## 2020-01-19 NOTE — Telephone Encounter (Signed)
Pt. Asking if she can take a shower with dressing on right arm. Instructed she could cover dressing with plastic. If dressing gets wet, change dressing. PCP wound care instructions in yesterday's note state area can be cleaned gently with soap and water. Pt. Verbalizes understanding.

## 2020-01-19 NOTE — Telephone Encounter (Signed)
Spoke with Arbie Cookey, patient's daughter and she is wanting results of xrays.  The final result has not been delivered.  There are images but no interpretation of them in the system.  Arbie Cookey, asked that since Dr Dossie Arbour is a Doctor, couldn't he just look at them, as he said he was going to do and make an interpretation from the images.  I told her that he might could do that but also, we are waiting for interpretation from radiology as to their findings.  Arbie Cookey would like for him to take a "looksy" at images to determine what might be going on and causing her mom to be in such pain.  I told her I would convey this message to Dr Dossie Arbour.

## 2020-01-20 ENCOUNTER — Ambulatory Visit
Admission: RE | Admit: 2020-01-20 | Discharge: 2020-01-20 | Disposition: A | Discharge status: Home / Self Care | Payer: Medicare Other | Source: Ambulatory Visit | Attending: Orthopedic Surgery | Admitting: Orthopedic Surgery

## 2020-01-20 ENCOUNTER — Other Ambulatory Visit: Payer: Self-pay | Admitting: Orthopedic Surgery

## 2020-01-20 ENCOUNTER — Other Ambulatory Visit: Payer: Self-pay

## 2020-01-20 DIAGNOSIS — M8448XA Pathological fracture, other site, initial encounter for fracture: Secondary | ICD-10-CM

## 2020-01-21 ENCOUNTER — Other Ambulatory Visit
Admission: RE | Admit: 2020-01-21 | Discharge: 2020-01-21 | Disposition: A | Discharge status: Home / Self Care | Payer: Medicare Other | Source: Ambulatory Visit | Attending: Orthopedic Surgery | Admitting: Orthopedic Surgery

## 2020-01-21 DIAGNOSIS — Z20822 Contact with and (suspected) exposure to covid-19: Secondary | ICD-10-CM | POA: Diagnosis not present

## 2020-01-21 DIAGNOSIS — Z01812 Encounter for preprocedural laboratory examination: Secondary | ICD-10-CM | POA: Diagnosis present

## 2020-01-22 LAB — SARS CORONAVIRUS 2 (TAT 6-24 HRS): SARS Coronavirus 2: NEGATIVE
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
## 2020-01-25 ENCOUNTER — Encounter: Payer: Self-pay | Admitting: Orthopedic Surgery

## 2020-01-25 ENCOUNTER — Encounter
Admission: RE | Disposition: A | Discharge status: Home / Self Care | Payer: Self-pay | Source: Home / Self Care | Attending: Orthopedic Surgery

## 2020-01-25 ENCOUNTER — Ambulatory Visit: Payer: Medicare Other | Admitting: Certified Registered Nurse Anesthetist

## 2020-01-25 ENCOUNTER — Other Ambulatory Visit: Payer: Self-pay

## 2020-01-25 ENCOUNTER — Ambulatory Visit: Payer: Medicare Other

## 2020-01-25 ENCOUNTER — Other Ambulatory Visit: Payer: Self-pay | Admitting: Family Medicine

## 2020-01-25 ENCOUNTER — Ambulatory Visit
Admission: RE | Admit: 2020-01-25 | Discharge: 2020-01-25 | Disposition: A | Discharge status: Home / Self Care | Payer: Medicare Other | Attending: Orthopedic Surgery | Admitting: Orthopedic Surgery

## 2020-01-25 DIAGNOSIS — Z79899 Other long term (current) drug therapy: Secondary | ICD-10-CM | POA: Diagnosis not present

## 2020-01-25 DIAGNOSIS — R519 Headache, unspecified: Secondary | ICD-10-CM | POA: Insufficient documentation

## 2020-01-25 DIAGNOSIS — I10 Essential (primary) hypertension: Secondary | ICD-10-CM | POA: Insufficient documentation

## 2020-01-25 DIAGNOSIS — E039 Hypothyroidism, unspecified: Secondary | ICD-10-CM | POA: Diagnosis not present

## 2020-01-25 DIAGNOSIS — F419 Anxiety disorder, unspecified: Secondary | ICD-10-CM | POA: Diagnosis not present

## 2020-01-25 DIAGNOSIS — S32010A Wedge compression fracture of first lumbar vertebra, initial encounter for closed fracture: Secondary | ICD-10-CM

## 2020-01-25 DIAGNOSIS — Z79891 Long term (current) use of opiate analgesic: Secondary | ICD-10-CM | POA: Insufficient documentation

## 2020-01-25 DIAGNOSIS — X58XXXA Exposure to other specified factors, initial encounter: Secondary | ICD-10-CM | POA: Insufficient documentation

## 2020-01-25 DIAGNOSIS — Z981 Arthrodesis status: Secondary | ICD-10-CM | POA: Diagnosis not present

## 2020-01-25 DIAGNOSIS — S32040A Wedge compression fracture of fourth lumbar vertebra, initial encounter for closed fracture: Secondary | ICD-10-CM | POA: Diagnosis not present

## 2020-01-25 DIAGNOSIS — K219 Gastro-esophageal reflux disease without esophagitis: Secondary | ICD-10-CM | POA: Diagnosis not present

## 2020-01-25 DIAGNOSIS — T22291A Burn of second degree of multiple sites of right shoulder and upper limb, except wrist and hand, initial encounter: Secondary | ICD-10-CM

## 2020-01-25 HISTORY — PX: KYPHOPLASTY: SHX5884

## 2020-01-25 SURGERY — KYPHOPLASTY
Anesthesia: General

## 2020-01-25 MED ORDER — ACETAMINOPHEN 10 MG/ML IV SOLN
INTRAVENOUS | Status: AC
  Filled 2020-01-25: qty 100

## 2020-01-25 MED ORDER — METOCLOPRAMIDE HCL 5 MG/ML IJ SOLN: 5.0000 mg | Freq: Three times a day (TID) | INTRAMUSCULAR | Status: DC | PRN

## 2020-01-25 MED ORDER — LIDOCAINE HCL 1 % IJ SOLN
INTRAMUSCULAR | Status: DC | PRN
  Administered 2020-01-25: 10 mL
  Administered 2020-01-25: 20 mL

## 2020-01-25 MED ORDER — FENTANYL CITRATE (PF) 100 MCG/2ML IJ SOLN
INTRAMUSCULAR | Status: AC
  Administered 2020-01-25: 50 ug via INTRAVENOUS
  Filled 2020-01-25: qty 2

## 2020-01-25 MED ORDER — OXYCODONE HCL 5 MG/5ML PO SOLN: 5.0000 mg | Freq: Once | ORAL | Status: DC | PRN

## 2020-01-25 MED ORDER — ONDANSETRON HCL 4 MG/2ML IJ SOLN: 4.0000 mg | Freq: Four times a day (QID) | INTRAMUSCULAR | Status: DC | PRN

## 2020-01-25 MED ORDER — ONDANSETRON HCL 4 MG PO TABS: 4.0000 mg | ORAL_TABLET | Freq: Four times a day (QID) | ORAL | Status: DC | PRN

## 2020-01-25 MED ORDER — FENTANYL CITRATE (PF) 100 MCG/2ML IJ SOLN
INTRAMUSCULAR | Status: AC
  Administered 2020-01-25: 25 ug via INTRAVENOUS
  Filled 2020-01-25: qty 2

## 2020-01-25 MED ORDER — OXYCODONE HCL 5 MG PO TABS: 5.0000 mg | ORAL_TABLET | Freq: Once | ORAL | Status: DC | PRN

## 2020-01-25 MED ORDER — ACETAMINOPHEN 10 MG/ML IV SOLN
INTRAVENOUS | Status: DC | PRN
  Administered 2020-01-25: 1000 mg via INTRAVENOUS

## 2020-01-25 MED ORDER — SODIUM CHLORIDE 0.9 % IV SOLN: INTRAVENOUS | Status: DC

## 2020-01-25 MED ORDER — FENTANYL CITRATE (PF) 100 MCG/2ML IJ SOLN
INTRAMUSCULAR | Status: AC
  Filled 2020-01-25: qty 2

## 2020-01-25 MED ORDER — HYDROCODONE-ACETAMINOPHEN 5-325 MG PO TABS: 1.0000 | ORAL_TABLET | Freq: Four times a day (QID) | ORAL | 0 refills | Status: DC | PRN

## 2020-01-25 MED ORDER — IOHEXOL 180 MG/ML  SOLN
INTRAMUSCULAR | Status: DC | PRN
  Administered 2020-01-25: 20 mL

## 2020-01-25 MED ORDER — CEFAZOLIN SODIUM-DEXTROSE 2-4 GM/100ML-% IV SOLN
INTRAVENOUS | Status: AC
  Filled 2020-01-25: qty 100

## 2020-01-25 MED ORDER — PROPOFOL 10 MG/ML IV BOLUS
INTRAVENOUS | Status: DC | PRN
  Administered 2020-01-25: 20 mg via INTRAVENOUS

## 2020-01-25 MED ORDER — CEFAZOLIN SODIUM-DEXTROSE 2-4 GM/100ML-% IV SOLN
2.0000 g | INTRAVENOUS | Status: AC
  Administered 2020-01-25: 2 g via INTRAVENOUS

## 2020-01-25 MED ORDER — LACTATED RINGERS IV SOLN: INTRAVENOUS | Status: DC

## 2020-01-25 MED ORDER — FENTANYL CITRATE (PF) 100 MCG/2ML IJ SOLN
INTRAMUSCULAR | Status: DC | PRN
  Administered 2020-01-25 (×4): 25 ug via INTRAVENOUS

## 2020-01-25 MED ORDER — METOCLOPRAMIDE HCL 10 MG PO TABS: 5.0000 mg | ORAL_TABLET | Freq: Three times a day (TID) | ORAL | Status: DC | PRN

## 2020-01-25 MED ORDER — PROPOFOL 500 MG/50ML IV EMUL
INTRAVENOUS | Status: DC | PRN
  Administered 2020-01-25: 50 ug/kg/min via INTRAVENOUS

## 2020-01-25 MED ORDER — FENTANYL CITRATE (PF) 100 MCG/2ML IJ SOLN
25.0000 ug | INTRAMUSCULAR | Status: DC | PRN
  Administered 2020-01-25: 50 ug via INTRAVENOUS
  Administered 2020-01-25: 25 ug via INTRAVENOUS

## 2020-01-25 MED ORDER — BUPIVACAINE-EPINEPHRINE (PF) 0.5% -1:200000 IJ SOLN
INTRAMUSCULAR | Status: DC | PRN
  Administered 2020-01-25: 20 mL

## 2020-01-25 MED ORDER — PROPOFOL 500 MG/50ML IV EMUL
INTRAVENOUS | Status: AC
  Filled 2020-01-25: qty 50

## 2020-01-25 MED ORDER — LIDOCAINE HCL (CARDIAC) PF 100 MG/5ML IV SOSY
PREFILLED_SYRINGE | INTRAVENOUS | Status: DC | PRN
  Administered 2020-01-25: 50 mg via INTRAVENOUS

## 2020-01-25 SURGICAL SUPPLY — 18 items
CEMENT KYPHON CX01A KIT/MIXER (Cement) ×2 IMPLANT
COVER WAND RF STERILE (DRAPES) ×2 IMPLANT
DERMABOND ADVANCED (GAUZE/BANDAGES/DRESSINGS) ×1
DERMABOND ADVANCED .7 DNX12 (GAUZE/BANDAGES/DRESSINGS) ×1 IMPLANT
DEVICE BIOPSY BONE KYPHX (INSTRUMENTS) ×2 IMPLANT
DRAPE C-ARM XRAY 36X54 (DRAPES) ×2 IMPLANT
DURAPREP 26ML APPLICATOR (WOUND CARE) ×2 IMPLANT
GLOVE SURG SYN 9.0  PF PI (GLOVE) ×1
GLOVE SURG SYN 9.0 PF PI (GLOVE) ×1 IMPLANT
GOWN SRG 2XL LVL 4 RGLN SLV (GOWNS) ×1 IMPLANT
GOWN STRL NON-REIN 2XL LVL4 (GOWNS) ×1
GOWN STRL REUS W/ TWL LRG LVL3 (GOWN DISPOSABLE) ×1 IMPLANT
GOWN STRL REUS W/TWL LRG LVL3 (GOWN DISPOSABLE) ×1
PACK KYPHOPLASTY (MISCELLANEOUS) ×2 IMPLANT
RENTAL RFA GENERATOR (MISCELLANEOUS) IMPLANT
STRAP SAFETY 5IN WIDE (MISCELLANEOUS) ×2 IMPLANT
SWABSTK COMLB BENZOIN TINCTURE (MISCELLANEOUS) ×2 IMPLANT
TRAY KYPHOPAK 20/3 EXPRESS 1ST (MISCELLANEOUS) ×2 IMPLANT
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
## 2020-01-25 NOTE — Anesthesia Preprocedure Evaluation (Signed)
Anesthesia Evaluation  Patient identified by MRN, date of birth, ID band Patient awake    Reviewed: Allergy & Precautions, H&P , NPO status , Patient's Chart, lab work & pertinent test results  History of Anesthesia Complications Negative for: history of anesthetic complications  Airway Mallampati: III  TM Distance: <3 FB Neck ROM: limited    Dental  (+) Poor Dentition, Missing, Upper Dentures, Lower Dentures   Pulmonary neg shortness of breath,    Pulmonary exam normal        Cardiovascular hypertension, (-) angina(-) Past MI negative cardio ROS Normal cardiovascular exam     Neuro/Psych  Neuromuscular disease negative neurological ROS  negative psych ROS   GI/Hepatic negative GI ROS, Neg liver ROS, hiatal hernia, GERD  Medicated and Controlled,  Endo/Other  negative endocrine ROSHypothyroidism   Renal/GU Renal diseasenegative Renal ROS  negative genitourinary   Musculoskeletal  (+) Arthritis ,   Abdominal   Peds  Hematology negative hematology ROS (+)   Anesthesia Other Findings Past Medical History: No date: Anxiety No date: Back ache No date: Hiatal hernia No date: Hyperlipidemia No date: Hypertension No date: Hypothyroid No date: Osteopenia No date: Thyroid disease No date: UTI (urinary tract infection)  Past Surgical History: No date: HEMORRHOID SURGERY No date: HERNIA REPAIR No date: HERNIA REPAIR     Reproductive/Obstetrics negative OB ROS                             Anesthesia Physical  Anesthesia Plan  ASA: III  Anesthesia Plan: General   Post-op Pain Management:    Induction: Intravenous  PONV Risk Score and Plan: 3 and Ondansetron, Dexamethasone, Propofol and Midazolam  Airway Management Planned: Natural Airway and Nasal Cannula  Additional Equipment:   Intra-op Plan:   Post-operative Plan:   Informed Consent: I have reviewed the patients History  and Physical, chart, labs and discussed the procedure including the risks, benefits and alternatives for the proposed anesthesia with the patient or authorized representative who has indicated his/her understanding and acceptance.     Dental Advisory Given  Plan Discussed with: Anesthesiologist, CRNA and Surgeon  Anesthesia Plan Comments: (Patient consented for risks of anesthesia including but not limited to:  - adverse reactions to medications - risk of intubation if required - damage to eyes, teeth, lips or other oral mucosa - nerve damage due to positioning  - sore throat or hoarseness - Damage to heart, brain, nerves, lungs, other parts of body or loss of life  Patient voiced understanding.)        Anesthesia Quick Evaluation

## 2020-01-25 NOTE — Transfer of Care (Signed)
Immediate Anesthesia Transfer of Care Note  Patient: Brittney Simpson  Procedure(s) Performed: L4 KYPHOPLASTY (N/A )  Patient Location: PACU  Anesthesia Type:General  Level of Consciousness: sedated  Airway & Oxygen Therapy: Patient Spontanous Breathing and Patient connected to face mask oxygen  Post-op Assessment: Report given to RN and Post -op Vital signs reviewed and stable  Post vital signs: Reviewed and stable  Last Vitals:  Vitals Value Taken Time  BP 148/82 01/25/20 1243  Temp 36.5 C 01/25/20 1243  Pulse 81 01/25/20 1252  Resp 19 01/25/20 1252  SpO2 97 % 01/25/20 1252  Vitals shown include unvalidated device data.  Last Pain:  Vitals:   01/25/20 1243  TempSrc:   PainSc: (P) Asleep      Patients Stated Pain Goal: 2 (87/27/61 8485)  Complications: No complications documented.

## 2020-01-25 NOTE — Discharge Instructions (Addendum)
Take it easy today and tomorrow try not to do too much activity. Call our office if you need any additional pain medicine. Remove Band-Aid on Thursday then okay to shower. Try to avoid bending or lifting for the next 2 weeks but try to walk is much as you can.   AMBULATORY SURGERY  DISCHARGE INSTRUCTIONS   1) The drugs that you were given will stay in your system until tomorrow so for the next 24 hours you should not:  A) Drive an automobile B) Make any legal decisions C) Drink any alcoholic beverage   2) You may resume regular meals tomorrow.  Today it is better to start with liquids and gradually work up to solid foods.  You may eat anything you prefer, but it is better to start with liquids, then soup and crackers, and gradually work up to solid foods.   3) Please notify your doctor immediately if you have any unusual bleeding, trouble breathing, redness and pain at the surgery site, drainage, fever, or pain not relieved by medication.    4) Additional Instructions:        Please contact your physician with any problems or Same Day Surgery at [phone], Monday through Friday 6 am to 4 pm, or [place] at [facility] number at [phone].

## 2020-01-25 NOTE — OR Nursing (Signed)
Notified Dr. Rudene Christians via secure chat that pt advises she only has two pain pills left at home.  Per MD response, he has sent rx to pt's pharmacy; patient and daughter notified of same.

## 2020-01-25 NOTE — Anesthesia Procedure Notes (Signed)
Date/Time: 01/25/2020 11:52 AM Performed by: Johnna Acosta, CRNA Pre-anesthesia Checklist: Patient identified, Emergency Drugs available, Suction available, Patient being monitored and Timeout performed Patient Re-evaluated:Patient Re-evaluated prior to induction Oxygen Delivery Method: Nasal cannula Preoxygenation: Pre-oxygenation with 100% oxygen Induction Type: IV induction

## 2020-01-25 NOTE — Anesthesia Postprocedure Evaluation (Signed)
Anesthesia Post Note  Patient: Brittney Simpson  Procedure(s) Performed: L4 KYPHOPLASTY (N/A )  Patient location during evaluation: PACU Anesthesia Type: General Level of consciousness: awake and alert Pain management: pain level controlled Vital Signs Assessment: post-procedure vital signs reviewed and stable Respiratory status: spontaneous breathing, nonlabored ventilation, respiratory function stable and patient connected to nasal cannula oxygen Cardiovascular status: blood pressure returned to baseline and stable Postop Assessment: no apparent nausea or vomiting Anesthetic complications: no   No complications documented.   Last Vitals:  Vitals:   01/25/20 1422 01/25/20 1430  BP: (!) 161/87 (!) 170/71  Pulse: 73 73  Resp: 20 18  Temp: (!) 36.4 C 36.7 C  SpO2: 95% 99%    Last Pain:  Vitals:   01/25/20 1430  TempSrc: Temporal  PainSc: 4                  Precious Haws Kreed Kauffman

## 2020-01-26 ENCOUNTER — Ambulatory Visit (INDEPENDENT_AMBULATORY_CARE_PROVIDER_SITE_OTHER): Payer: Medicare Other | Admitting: Family Medicine

## 2020-01-26 ENCOUNTER — Encounter: Payer: Self-pay | Admitting: Family Medicine

## 2020-01-26 VITALS — BP 145/72 | HR 73 | Temp 97.5°F | Resp 16 | Ht 65.5 in

## 2020-01-26 DIAGNOSIS — T22291A Burn of second degree of multiple sites of right shoulder and upper limb, except wrist and hand, initial encounter: Secondary | ICD-10-CM | POA: Diagnosis not present

## 2020-01-26 DIAGNOSIS — T22291D Burn of second degree of multiple sites of right shoulder and upper limb, except wrist and hand, subsequent encounter: Secondary | ICD-10-CM
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
## 2020-01-26 NOTE — Patient Instructions (Addendum)
Thank you for coming to the office today.  Upper arm - the yellow is healing tissue but it can be treated more by wound center to make sure it is not too thick, use burn cream, non adhesive dressing, wrap - referral to wound center  Lower arm - healing well, shiny red pink is healing skin, rest is dead skin will come off, can use topical neosporin, leave open, no full wrap  Foot - healing well, superficial blisters only, healing skin, can use topical neosporin and wrap if covering with sock and active, otherwise may leave open sometimes.  No significant bacterial infection  They will call you  [facility] [address], [place],  [place]  [address] Main: [phone]  Please schedule a Follow-up Appointment to: Return if symptoms worsen or fail to improve, for burn.  If you have any other questions or concerns, please feel free to call the office or send a message through [place]. You may also schedule an earlier appointment if necessary.  Additionally, you may be receiving a survey about your experience at our office within a few days to 1 week by e-mail or mail. We value your feedback.  Nobie Putnam, DO [place]

## 2020-02-02 ENCOUNTER — Other Ambulatory Visit: Payer: Self-pay

## 2020-02-02 ENCOUNTER — Other Ambulatory Visit: Payer: Self-pay | Admitting: Student

## 2020-02-02 ENCOUNTER — Ambulatory Visit
Admission: RE | Admit: 2020-02-02 | Discharge: 2020-02-02 | Disposition: A | Discharge status: Home / Self Care | Payer: Medicare Other | Source: Ambulatory Visit | Attending: Student | Admitting: Student

## 2020-02-02 DIAGNOSIS — S32050A Wedge compression fracture of fifth lumbar vertebra, initial encounter for closed fracture: Secondary | ICD-10-CM

## 2020-02-03 ENCOUNTER — Telehealth: Payer: Self-pay | Admitting: *Deleted

## 2020-02-04 ENCOUNTER — Encounter: Payer: Self-pay | Admitting: Pain Medicine

## 2020-02-04 NOTE — Telephone Encounter (Signed)
Spoke with daughter [name].  Moved patients appointment to Monday 02-07-2020.  Daughter notified.
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
## 2020-02-07 ENCOUNTER — Other Ambulatory Visit: Payer: Self-pay

## 2020-02-07 ENCOUNTER — Ambulatory Visit: Payer: Medicare Other | Attending: Pain Medicine | Admitting: Pain Medicine

## 2020-02-07 DIAGNOSIS — M545 Low back pain: Secondary | ICD-10-CM | POA: Diagnosis not present

## 2020-02-07 DIAGNOSIS — M4854XS Collapsed vertebra, not elsewhere classified, thoracic region, sequela of fracture: Secondary | ICD-10-CM | POA: Diagnosis not present

## 2020-02-07 DIAGNOSIS — S22080S Wedge compression fracture of T11-T12 vertebra, sequela: Secondary | ICD-10-CM

## 2020-02-07 DIAGNOSIS — M48062 Spinal stenosis, lumbar region with neurogenic claudication: Secondary | ICD-10-CM

## 2020-02-07 DIAGNOSIS — G8929 Other chronic pain: Secondary | ICD-10-CM

## 2020-02-07 DIAGNOSIS — M79604 Pain in right leg: Secondary | ICD-10-CM

## 2020-02-07 DIAGNOSIS — M5416 Radiculopathy, lumbar region: Secondary | ICD-10-CM | POA: Diagnosis not present

## 2020-02-07 DIAGNOSIS — M4317 Spondylolisthesis, lumbosacral region: Secondary | ICD-10-CM | POA: Diagnosis not present

## 2020-02-07 DIAGNOSIS — M25551 Pain in right hip: Secondary | ICD-10-CM | POA: Diagnosis not present

## 2020-02-07 DIAGNOSIS — G894 Chronic pain syndrome: Secondary | ICD-10-CM | POA: Diagnosis not present

## 2020-02-07 DIAGNOSIS — Z79899 Other long term (current) drug therapy: Secondary | ICD-10-CM

## 2020-02-07 NOTE — Patient Instructions (Signed)
____________________________________________________________________________________________  Preparing for your procedure (without sedation)  Procedure appointments are limited to planned procedures: . No Prescription Refills. . No disability issues will be discussed. . No medication changes will be discussed.  Instructions: . Oral Intake: Do not eat or drink anything for at least 6 hours prior to your procedure. (Exception: Blood Pressure Medication. See below.) . Transportation: Unless otherwise stated by your physician, you may drive yourself after the procedure. . Blood Pressure Medicine: Do not forget to take your blood pressure medicine with a sip of water the morning of the procedure. If your Diastolic (lower reading)is above 100 mmHg, elective cases will be cancelled/rescheduled. . Blood thinners: These will need to be stopped for procedures. Notify our staff if you are taking any blood thinners. Depending on which one you take, there will be specific instructions on how and when to stop it. . Diabetics on insulin: Notify the staff so that you can be scheduled 1st case in the morning. If your diabetes requires high dose insulin, take only  of your normal insulin dose the morning of the procedure and notify the staff that you have done so. . Preventing infections: Shower with an antibacterial soap the morning of your procedure.  . Build-up your immune system: Take 1000 mg of Vitamin C with every meal (3 times a day) the day prior to your procedure. . Antibiotics: Inform the staff if you have a condition or reason that requires you to take antibiotics before dental procedures. . Pregnancy: If you are pregnant, call and cancel the procedure. . Sickness: If you have a cold, fever, or any active infections, call and cancel the procedure. . Arrival: You must be in the facility at least 30 minutes prior to your scheduled procedure. . Children: Do not bring any children with you. . Dress  appropriately: Bring dark clothing that you would not mind if they get stained. . Valuables: Do not bring any jewelry or valuables.  Reasons to call and reschedule or cancel your procedure: (Following these recommendations will minimize the risk of a serious complication.) . Surgeries: Avoid having procedures within 2 weeks of any surgery. (Avoid for 2 weeks before or after any surgery). . Flu Shots: Avoid having procedures within 2 weeks of a flu shots or . (Avoid for 2 weeks before or after immunizations). . Barium: Avoid having a procedure within 7-10 days after having had a radiological study involving the use of radiological contrast. (Myelograms, Barium swallow or enema study). . Heart attacks: Avoid any elective procedures or surgeries for the initial 6 months after a "Myocardial Infarction" (Heart Attack). . Blood thinners: It is imperative that you stop these medications before procedures. Let us know if you if you take any blood thinner.  . Infection: Avoid procedures during or within two weeks of an infection (including chest colds or gastrointestinal problems). Symptoms associated with infections include: Localized redness, fever, chills, night sweats or profuse sweating, burning sensation when voiding, cough, congestion, stuffiness, runny nose, sore throat, diarrhea, nausea, vomiting, cold or Flu symptoms, recent or current infections. It is specially important if the infection is over the area that we intend to treat. . Heart and lung problems: Symptoms that may suggest an active cardiopulmonary problem include: cough, chest pain, breathing difficulties or shortness of breath, dizziness, ankle swelling, uncontrolled high or unusually low blood pressure, and/or palpitations. If you are experiencing any of these symptoms, cancel your procedure and contact your primary care physician for an evaluation.  Remember:  Regular   Business hours are:  Monday to Thursday 8:00 AM to 4:00  PM  Provider's Schedule: Brittney Seybold, MD:  Procedure days: Tuesday and Thursday 7:30 AM to 4:00 PM  Brittney Lateef, MD:  Procedure days: Monday and Wednesday 7:30 AM to 4:00 PM ____________________________________________________________________________________________    

## 2020-02-08 ENCOUNTER — Other Ambulatory Visit (INDEPENDENT_AMBULATORY_CARE_PROVIDER_SITE_OTHER): Payer: Medicare Other

## 2020-02-08 ENCOUNTER — Ambulatory Visit (INDEPENDENT_AMBULATORY_CARE_PROVIDER_SITE_OTHER): Payer: Medicare Other | Admitting: Vascular Surgery

## 2020-02-09 ENCOUNTER — Telehealth: Payer: Medicare Other | Admitting: Pain Medicine

## 2020-02-09 NOTE — Progress Notes (Signed)
PROVIDER NOTE: Information contained herein reflects review and annotations entered in association with encounter. Interpretation of such information and data should be left to medically-trained personnel. Information provided to patient can be located elsewhere in the medical record under "Patient Instructions". Document created using STT-dictation technology, any transcriptional errors that may result from process are unintentional.    Patient: Brittney Simpson  Service Category: Procedure  Provider: Gaspar Cola, MD  DOB: 01-Jul-1928  DOS: 02/10/2020  Location: [facility]  MRN: [id number]  Setting: Ambulatory - outpatient  Referring Provider: Nobie Putnam *  Type: Established Patient  Specialty: Interventional Pain Management  PCP: Olin Hauser, DO   Primary Reason for Visit: Interventional Pain Management Treatment. CC: Procedure  Procedure #1:  Anesthesia, Analgesia, Anxiolysis:  Type: Diagnostic Sacroiliac Joint Steroid Injection          Region: Superior Lumbosacral Region Level: PSIS (Posterior Superior Iliac Spine) Laterality: Bilateral  Type: Moderate (Conscious) Sedation combined with Local Anesthesia Indication(s): Analgesia and Anxiety Route: Intravenous (IV) IV Access: Secured Sedation: Meaningful verbal contact was maintained at all times during the procedure  Local Anesthetic: Lidocaine 1-2%  Position: Prone           Indications: 1. Chronic sacroiliac joint pain (Bilateral) (R>L)   2. Somatic dysfunction of sacroiliac joints (Bilateral)   3. Other spondylosis, sacral and sacrococcygeal region   4. Chronic low back pain (Primary Area of Pain) (Bilateral) (R>L)    Procedure #2:  Anesthesia, Analgesia, Anxiolysis:  Type: Intra-Articular Hip Injection          Primary Purpose: Diagnostic Region: Posterolateral hip joint area. Level: Lower pelvic and hip joint level. Target Area: Superior aspect of the hip joint cavity, going  thru the superior portion of the capsular ligament. Approach: Posterolateral approach. Laterality: Right  Type: Moderate (Conscious) Sedation combined with Local Anesthesia Indication(s): Analgesia and Anxiety Route: Intravenous (IV) IV Access: Secured Sedation: Meaningful verbal contact was maintained at all times during the procedure  Local Anesthetic: Lidocaine 1-2%  Position: Lateral Decubitus with bad side up Prepped Area: Entire Posterolateral hip area. DuraPrep (Iodine Povacrylex [0.7% available iodine] and Isopropyl Alcohol, 74% w/w)   Indications: 1. Chronic hip pain (Secondary area of Pain) (Right)   2. Osteoarthritis of hip (Right)   3. Chronic low back pain (Primary Area of Pain) (Bilateral) (R>L)   Pain Score: Pre-procedure: 9 /10 Post-procedure: 6 /10   Pre-op Assessment:  Brittney Simpson is a 86 y.o. (year old), female patient, seen today for interventional treatment. She  has a past surgical history that includes Hernia repair; Hernia repair; Hemorrhoid surgery; Kyphoplasty (N/A, 01/23/2017); and Kyphoplasty (N/A, 01/25/2020). Brittney Simpson has a current medication list which includes the following prescription(s): acetaminophen, amlodipine, clonazepam, cranberry, vitamin d2, furosemide, hydrocodone-acetaminophen, hydrocodone-acetaminophen, latanoprost, levothyroxine, lisinopril, melatonin, metoprolol succinate, omeprazole, simethicone, ssd, sulfamethoxazole-trimethoprim, tramadol, trazodone, and methylprednisolone. Her primarily concern today is the Procedure  Initial Vital Signs:  Pulse/HCG Rate: 83  Temp: 98.1 F (36.7 C) Resp: 18 BP: (!) 155/84 SpO2: 98 %  BMI: Estimated body mass index is 21.13 kg/m as calculated from the following:   Height as of this encounter: 5\' 5"  (1.651 m).   Weight as of this encounter: 127 lb (57.6 kg).  Risk Assessment: Allergies: Reviewed. She has No Known Allergies.  Allergy Precautions: None required Coagulopathies: Reviewed. None  identified.  Blood-thinner therapy: None at this time Active Infection(s): Reviewed. None identified. Brittney Simpson is afebrile  Site Confirmation: Brittney Simpson was asked to  confirm the procedure and laterality before marking the site Procedure checklist: Completed Consent: Before the procedure and under the influence of no sedative(s), amnesic(s), or anxiolytics, the patient was informed of the treatment options, risks and possible complications. To fulfill our ethical and legal obligations, as recommended by the American Medical Association's Code of Ethics, I have informed the patient of my clinical impression; the nature and purpose of the treatment or procedure; the risks, benefits, and possible complications of the intervention; the alternatives, including doing nothing; the risk(s) and benefit(s) of the alternative treatment(s) or procedure(s); and the risk(s) and benefit(s) of doing nothing. The patient was provided information about the general risks and possible complications associated with the procedure. These may include, but are not limited to: failure to achieve desired goals, infection, bleeding, organ or nerve damage, allergic reactions, paralysis, and death. In addition, the patient was informed of those risks and complications associated to the procedure, such as failure to decrease pain; infection; bleeding; organ or nerve damage with subsequent damage to sensory, motor, and/or autonomic systems, resulting in permanent pain, numbness, and/or weakness of one or several areas of the body; allergic reactions; (i.e.: anaphylactic reaction); and/or death. Furthermore, the patient was informed of those risks and complications associated with the medications. These include, but are not limited to: allergic reactions (i.e.: anaphylactic or anaphylactoid reaction(s)); adrenal axis suppression; blood sugar elevation that in diabetics may result in ketoacidosis or comma; water retention that in  patients with history of congestive heart failure may result in shortness of breath, pulmonary edema, and decompensation with resultant heart failure; weight gain; swelling or edema; medication-induced neural toxicity; particulate matter embolism and blood vessel occlusion with resultant organ, and/or nervous system infarction; and/or aseptic necrosis of one or more joints. Finally, the patient was informed that Medicine is not an exact science; therefore, there is also the possibility of unforeseen or unpredictable risks and/or possible complications that may result in a catastrophic outcome. The patient indicated having understood very clearly. We have given the patient no guarantees and we have made no promises. Enough time was given to the patient to ask questions, all of which were answered to the patient's satisfaction. Ms. Sara has indicated that she wanted to continue with the procedure. Attestation: I, the ordering provider, attest that I have discussed with the patient the benefits, risks, side-effects, alternatives, likelihood of achieving goals, and potential problems during recovery for the procedure that I have provided informed consent. Date  Time: 02/10/2020  9:04 AM  Pre-Procedure Preparation:  Monitoring: As per clinic protocol. Respiration, ETCO2, SpO2, BP, heart rate and rhythm monitor placed and checked for adequate function Safety Precautions: Patient was assessed for positional comfort and pressure points before starting the procedure. Time-out: I initiated and conducted the "Time-out" before starting the procedure, as per protocol. The patient was asked to participate by confirming the accuracy of the "Time Out" information. Verification of the correct person, site, and procedure were performed and confirmed by me, the nursing staff, and the patient. "Time-out" conducted as per Joint Commission's Universal Protocol (UP.01.01.01). Time: 1001  Description of Procedure #1:   Target Area: Superior, posterior, aspect of the sacroiliac fissure Approach: Posterior, paraspinal, ipsilateral approach. Area Prepped: Entire Lower Lumbosacral Region DuraPrep (Iodine Povacrylex [0.7% available iodine] and Isopropyl Alcohol, 74% w/w) Safety Precautions: Aspiration looking for blood return was conducted prior to all injections. At no point did we inject any substances, as a needle was being advanced. No attempts were made at seeking any  paresthesias. Safe injection practices and needle disposal techniques used. Medications properly checked for expiration dates. SDV (single dose vial) medications used. Description of the Procedure: Protocol guidelines were followed. The patient was placed in position over the procedure table. The target area was identified and the area prepped in the usual manner. Skin & deeper tissues infiltrated with local anesthetic. Appropriate amount of time allowed to pass for local anesthetics to take effect. The procedure needle was advanced under fluoroscopic guidance into the sacroiliac joint until a firm endpoint was obtained. Proper needle placement secured. Negative aspiration confirmed. Solution injected in intermittent fashion, asking for systemic symptoms every 0.5cc of injectate. The needles were then removed and the area cleansed, making sure to leave some of the prepping solution back to take advantage of its long term bactericidal properties.   Start Time: 1001 hrs.  Materials:  Needle(s) Type: Spinal Needle Gauge: 22G Length: 3.5-in Medication(s): Please see orders for medications and dosing details.  Imaging Guidance (Non-Spinal) for procedure #1:  Type of Imaging Technique: Fluoroscopy Guidance (Non-Spinal) Indication(s): Assistance in needle guidance and placement for procedures requiring needle placement in or near specific anatomical locations not easily accessible without such assistance. Exposure Time: Please see nurses  notes. Contrast: Before injecting any contrast, we confirmed that the patient did not have an allergy to iodine, shellfish, or radiological contrast. Once satisfactory needle placement was completed at the desired level, radiological contrast was injected. Contrast injected under live fluoroscopy. No contrast complications. See chart for type and volume of contrast used. Fluoroscopic Guidance: I was personally present during the use of fluoroscopy. "Tunnel Vision Technique" used to obtain the best possible view of the target area. Parallax error corrected before commencing the procedure. "Direction-depth-direction" technique used to introduce the needle under continuous pulsed fluoroscopy. Once target was reached, antero-posterior, oblique, and lateral fluoroscopic projection used confirm needle placement in all planes. Images permanently stored in EMR. Interpretation: I personally interpreted the imaging intraoperatively. Adequate needle placement confirmed in multiple planes. Appropriate spread of contrast into desired area was observed. No evidence of afferent or efferent intravascular uptake. Permanent images saved into the patient's record.  Description of Procedure #2:  Safety Precautions: Aspiration looking for blood return was conducted prior to all injections. At no point did we inject any substances, as a needle was being advanced. No attempts were made at seeking any paresthesias. Safe injection practices and needle disposal techniques used. Medications properly checked for expiration dates. SDV (single dose vial) medications used. Description of the Procedure: Protocol guidelines were followed. The patient was placed in position over the fluoroscopy table. The target area was identified and the area prepped in the usual manner. Skin & deeper tissues infiltrated with local anesthetic. Appropriate amount of time allowed to pass for local anesthetics to take effect. The procedure needles were then  advanced to the target area. Proper needle placement secured. Negative aspiration confirmed. Solution injected in intermittent fashion, asking for systemic symptoms every 0.5cc of injectate. The needles were then removed and the area cleansed, making sure to leave some of the prepping solution back to take advantage of its long term bactericidal properties.  Vitals:   02/10/20 0945 02/10/20 0955 02/10/20 1000 02/10/20 1010  BP: (!) 162/94 (!) 177/84 (!) 187/78 (!) 178/80  Pulse: 86 80 79 80  Resp: 16 12 12 13   Temp:      TempSrc:      SpO2: 98% 98% 99% 97%  Weight:      Height:  End Time: 1009 hrs. Materials:  Needle(s) Type: Spinal Needle Gauge: 22G Length: 5.0-in Medication(s): Please see orders for medications and dosing details.  Imaging Guidance (Non-Spinal) for procedure #2:  Type of Imaging Technique: Fluoroscopy Guidance (Non-Spinal) Indication(s): Assistance in needle guidance and placement for procedures requiring needle placement in or near specific anatomical locations not easily accessible without such assistance. Exposure Time: Please see nurses notes. Contrast: Before injecting any contrast, we confirmed that the patient did not have an allergy to iodine, shellfish, or radiological contrast. Once satisfactory needle placement was completed at the desired level, radiological contrast was injected. Contrast injected under live fluoroscopy. No contrast complications. See chart for type and volume of contrast used. Fluoroscopic Guidance: I was personally present during the use of fluoroscopy. "Tunnel Vision Technique" used to obtain the best possible view of the target area. Parallax error corrected before commencing the procedure. "Direction-depth-direction" technique used to introduce the needle under continuous pulsed fluoroscopy. Once target was reached, antero-posterior, oblique, and lateral fluoroscopic projection used confirm needle placement in all planes. Images  permanently stored in EMR. Interpretation: I personally interpreted the imaging intraoperatively. Adequate needle placement confirmed in multiple planes. Appropriate spread of contrast into desired area was observed. No evidence of afferent or efferent intravascular uptake. Permanent images saved into the patient's record.  Antibiotic Prophylaxis:   Anti-infectives (From admission, onward)   None     Indication(s): None identified  Post-operative Assessment:  Post-procedure Vital Signs:  Pulse/HCG Rate: 80  Temp: 98.1 F (36.7 C) Resp: 13 BP: (!) 178/80 SpO2: 97 %  EBL: None  Complications: No immediate post-treatment complications observed by team, or reported by patient.  Note: The patient tolerated the entire procedure well. A repeat set of vitals were taken after the procedure and the patient was kept under observation following institutional policy, for this type of procedure. Post-procedural neurological assessment was performed, showing return to baseline, prior to discharge. The patient was provided with post-procedure discharge instructions, including a section on how to identify potential problems. Should any problems arise concerning this procedure, the patient was given instructions to immediately contact us, at any time, without hesitation. In any case, we plan to contact the patient by telephone for a follow-up status report regarding this interventional procedure.  Comments:  No additional relevant information.  Plan of Care  Orders:  Orders Placed This Encounter  Procedures  . SACROILIAC JOINT INJECTION    Scheduling Instructions:     Side: Bilateral     Sedation: No Sedation.     Timeframe: Today    Order Specific Question:   Where will this procedure be performed?    Answer:   [facility]  . HIP INJECTION    Scheduling Instructions:     Side: Right-sided     Sedation: No Sedation.     Timeframe: Today  . DG PAIN [facility] C-ARM 1-60 MIN NO REPORT     Intraoperative interpretation by procedural physician at [facility].    Standing Status:   Standing    Number of Occurrences:   1    Order Specific Question:   Reason for exam:    Answer:   Assistance in needle guidance and placement for procedures requiring needle placement in or near specific anatomical locations not easily accessible without such assistance.  . Informed Consent Details: Physician/Practitioner Attestation; Transcribe to consent form and obtain patient signature    Provider Attestation: I, [place] Dossie Arbour, MD, (Pain Management Specialist), the physician/practitioner, attest that  I have discussed with the patient the benefits, risks, side effects, alternatives, likelihood of achieving goals and potential problems during recovery for the procedure that I have provided informed consent.    Scheduling Instructions:     Procedure: Sacroiliac Joint Block     Indication/Reason: Chronic Low Back and Hip Pain secondary to Sacroiliac Joint Pain (Arthralgia/Arthropathy)     Nursing Order: Transcribe to consent form and obtain patient signature.     Note: Always confirm laterality of pain with Ms. Wendland, before procedure.  . Informed Consent Details: Physician/Practitioner Attestation; Transcribe to consent form and obtain patient signature    Nursing Order: Transcribe to consent form and obtain patient signature. Note: Always confirm laterality of pain with Ms. Gathers, before procedure. Procedure: Hip injection Indication/Reason: Hip Joint Pain (Arthralgia) Provider Attestation: I, [place] Dossie Arbour, MD, (Pain Management Specialist), the physician/practitioner, attest that I have discussed with the patient the benefits, risks, side effects, alternatives, likelihood of achieving goals and potential problems during recovery for the procedure that I have provided informed consent.  . Provide equipment / supplies at bedside    Equipment required: Single use,  disposable, "Block Tray"    Standing Status:   Standing    Number of Occurrences:   1    Order Specific Question:   Specify    Answer:   Block Tray   Chronic Opioid Analgesic:  Tramadol 50 mg, 1 tab PO q 8 hrs (150 mg/day of tramadol) MME/day: 15 mg/day.   Medications ordered for procedure: Meds ordered this encounter  Medications  . DISCONTD: iohexol (OMNIPAQUE) 180 MG/ML injection 10 mL    Must be Myelogram-compatible. If not available, you may substitute with a water-soluble, non-ionic, hypoallergenic, myelogram-compatible radiological contrast medium.  . DISCONTD: lidocaine (XYLOCAINE) 2 % (with pres) injection 400 mg  . DISCONTD: sodium chloride flush (NS) 0.9 % injection 2 mL  . DISCONTD: ropivacaine (PF) 2 mg/mL (0.2%) (NAROPIN) injection 2 mL  . DISCONTD: dexamethasone (DECADRON) injection 20 mg  . lidocaine (XYLOCAINE) 2 % (with pres) injection 400 mg  . triamcinolone acetonide (KENALOG-40) injection 40 mg  . triamcinolone acetonide (KENALOG-40) injection 40 mg  . ropivacaine (PF) 2 mg/mL (0.2%) (NAROPIN) injection 4 mL  . ropivacaine (PF) 2 mg/mL (0.2%) (NAROPIN) injection 4 mL  . methylPREDNISolone acetate (DEPO-MEDROL) injection 80 mg  . ropivacaine (PF) 2 mg/mL (0.2%) (NAROPIN) injection 9 mL  . iohexol (OMNIPAQUE) 180 MG/ML injection 10 mL    Must be Myelogram-compatible. If not available, you may substitute with a water-soluble, non-ionic, hypoallergenic, myelogram-compatible radiological contrast medium.   Medications administered: We administered lidocaine, triamcinolone acetonide, triamcinolone acetonide, ropivacaine (PF) 2 mg/mL (0.2%), ropivacaine (PF) 2 mg/mL (0.2%), methylPREDNISolone acetate, ropivacaine (PF) 2 mg/mL (0.2%), and iohexol.  See the medical record for exact dosing, route, and time of administration.  Follow-up plan:   Return in about 2 weeks (around 02/24/2020) for VV(15-min), (PP), pm on procedure day.       Interventional management options:   Considering: Possible bilateral lumbar facet RFA. (Offerred several times. Pt. chooses to hold.) Diagnostic right hip joint injection Diagnostic right L3 TFESI Diagnostic right trochanteric bursa injection Diagnostic right SI joint block Diagnostic midline caudal ESI (for the tailbone pain)   Palliative PRN treatment(s): Palliativeright lumbar facet block #12 Palliativeleft lumbar facet block #10 Palliative right L3-4 LESI        Recent Visits Date Type Provider Dept  02/07/20 Telemedicine Milinda Pointer, [facility]  01/04/20 Procedure visit Milinda Pointer,  MD [facility]  Showing recent visits within past 90 days and meeting all other requirements Today's Visits Date Type Provider Dept  02/10/20 Procedure visit Milinda Pointer, MD [facility]  Showing today's visits and meeting all other requirements Future Appointments Date Type Provider Dept  02/29/20 Appointment Milinda Pointer, MD [facility]  04/19/20 Appointment Milinda Pointer, MD [facility]  Showing future appointments within next 90 days and meeting all other requirements  Disposition: Discharge home  Discharge (Date  Time): 02/10/2020; 1014 hrs.   Primary Care Physician: Olin Hauser, DO Location: [facility] Note by: Gaspar Cola, MD Date: 02/10/2020; Time: 1:27 PM  Disclaimer:  Medicine is not an Chief Strategy Officer. The only guarantee in medicine is that nothing is guaranteed. It is important to note that the decision to proceed with this intervention was based on the information collected from the patient. The Data and conclusions were drawn from the patient's questionnaire, the interview, and the physical examination. Because the information was provided in large part by the patient, it cannot be guaranteed that it has not been purposely or unconsciously manipulated. Every effort has been  made to obtain as much relevant data as possible for this evaluation. It is important to note that the conclusions that lead to this procedure are derived in large part from the available data. Always take into account that the treatment will also be dependent on availability of resources and existing treatment guidelines, considered by other Pain Management Practitioners as being common knowledge and practice, at the time of the intervention. For Medico-Legal purposes, it is also important to point out that variation in procedural techniques and pharmacological choices are the acceptable norm. The indications, contraindications, technique, and results of the above procedure should only be interpreted and judged by a Board-Certified Interventional Pain Specialist with extensive familiarity and expertise in the same exact procedure and technique.

## 2020-02-10 ENCOUNTER — Encounter: Payer: Self-pay | Admitting: Pain Medicine

## 2020-02-10 ENCOUNTER — Ambulatory Visit (HOSPITAL_BASED_OUTPATIENT_CLINIC_OR_DEPARTMENT_OTHER): Payer: Medicare Other | Admitting: Pain Medicine

## 2020-02-10 ENCOUNTER — Other Ambulatory Visit: Payer: Self-pay

## 2020-02-10 ENCOUNTER — Ambulatory Visit
Admission: RE | Admit: 2020-02-10 | Discharge: 2020-02-10 | Disposition: A | Discharge status: Home / Self Care | Payer: Medicare Other | Source: Ambulatory Visit | Attending: Pain Medicine | Admitting: Pain Medicine

## 2020-02-10 VITALS — BP 178/80 | HR 80 | Temp 98.1°F | Resp 13 | Ht 65.0 in | Wt 127.0 lb

## 2020-02-10 DIAGNOSIS — M25551 Pain in right hip: Secondary | ICD-10-CM | POA: Insufficient documentation

## 2020-02-10 DIAGNOSIS — G8929 Other chronic pain: Secondary | ICD-10-CM | POA: Diagnosis not present

## 2020-02-10 DIAGNOSIS — M9904 Segmental and somatic dysfunction of sacral region: Secondary | ICD-10-CM | POA: Diagnosis not present

## 2020-02-10 DIAGNOSIS — M1611 Unilateral primary osteoarthritis, right hip: Secondary | ICD-10-CM

## 2020-02-10 DIAGNOSIS — M47898 Other spondylosis, sacral and sacrococcygeal region: Secondary | ICD-10-CM | POA: Diagnosis not present

## 2020-02-10 DIAGNOSIS — M4317 Spondylolisthesis, lumbosacral region: Secondary | ICD-10-CM | POA: Insufficient documentation

## 2020-02-10 DIAGNOSIS — M533 Sacrococcygeal disorders, not elsewhere classified: Secondary | ICD-10-CM | POA: Insufficient documentation

## 2020-02-10 DIAGNOSIS — M545 Low back pain, unspecified: Secondary | ICD-10-CM

## 2020-02-10 MED ORDER — LIDOCAINE HCL 2 % IJ SOLN: 20.0000 mL | Freq: Once | INTRAMUSCULAR | Status: DC

## 2020-02-10 MED ORDER — TRIAMCINOLONE ACETONIDE 40 MG/ML IJ SUSP
40.0000 mg | Freq: Once | INTRAMUSCULAR | Status: AC
  Administered 2020-02-10: 40 mg

## 2020-02-10 MED ORDER — METHYLPREDNISOLONE ACETATE 80 MG/ML IJ SUSP
INTRAMUSCULAR | Status: AC
  Filled 2020-02-10: qty 1

## 2020-02-10 MED ORDER — IOHEXOL 180 MG/ML  SOLN: 10.0000 mL | Freq: Once | INTRAMUSCULAR | Status: DC

## 2020-02-10 MED ORDER — METHYLPREDNISOLONE ACETATE 80 MG/ML IJ SUSP
80.0000 mg | Freq: Once | INTRAMUSCULAR | Status: AC
  Administered 2020-02-10: 80 mg via INTRA_ARTICULAR

## 2020-02-10 MED ORDER — ROPIVACAINE HCL 2 MG/ML IJ SOLN
4.0000 mL | Freq: Once | INTRAMUSCULAR | Status: AC
  Administered 2020-02-10: 4 mL via PERINEURAL

## 2020-02-10 MED ORDER — DEXAMETHASONE SODIUM PHOSPHATE 10 MG/ML IJ SOLN: 20.0000 mg | Freq: Once | INTRAMUSCULAR | Status: DC

## 2020-02-10 MED ORDER — IOHEXOL 180 MG/ML  SOLN
10.0000 mL | Freq: Once | INTRAMUSCULAR | Status: AC
  Administered 2020-02-10: 10 mL via EPIDURAL

## 2020-02-10 MED ORDER — ROPIVACAINE HCL 2 MG/ML IJ SOLN
9.0000 mL | Freq: Once | INTRAMUSCULAR | Status: AC
  Administered 2020-02-10: 9 mL via INTRA_ARTICULAR

## 2020-02-10 MED ORDER — ROPIVACAINE HCL 2 MG/ML IJ SOLN
4.0000 mL | Freq: Once | INTRAMUSCULAR | Status: AC
  Administered 2020-02-10: 4 mL via INTRA_ARTICULAR

## 2020-02-10 MED ORDER — ROPIVACAINE HCL 2 MG/ML IJ SOLN
INTRAMUSCULAR | Status: AC
  Filled 2020-02-10: qty 20

## 2020-02-10 MED ORDER — LIDOCAINE HCL 2 % IJ SOLN
20.0000 mL | Freq: Once | INTRAMUSCULAR | Status: AC
  Administered 2020-02-10: 400 mg

## 2020-02-10 MED ORDER — ROPIVACAINE HCL 2 MG/ML IJ SOLN: 2.0000 mL | Freq: Once | INTRAMUSCULAR | Status: DC

## 2020-02-10 MED ORDER — TRIAMCINOLONE ACETONIDE 40 MG/ML IJ SUSP
INTRAMUSCULAR | Status: AC
  Filled 2020-02-10: qty 1

## 2020-02-10 MED ORDER — SODIUM CHLORIDE 0.9% FLUSH: 2.0000 mL | Freq: Once | INTRAVENOUS | Status: DC

## 2020-02-10 MED ORDER — LIDOCAINE HCL 2 % IJ SOLN
INTRAMUSCULAR | Status: AC
  Filled 2020-02-10: qty 20

## 2020-02-10 NOTE — Patient Instructions (Addendum)
____________________________________________________________________________________________  Post-Procedure Discharge Instructions  Instructions:  Apply ice:   Purpose: This will minimize any swelling and discomfort after procedure.   When: Day of procedure, as soon as you get home.  How: Fill a plastic sandwich bag with crushed ice. Cover it with a small towel and apply to injection site.  How long: (15 min on, 15 min off) Apply for 15 minutes then remove x 15 minutes.  Repeat sequence on day of procedure, until you go to bed.  Apply heat:   Purpose: To treat any soreness and discomfort from the procedure.  When: Starting the next day after the procedure.  How: Apply heat to procedure site starting the day following the procedure.  How long: May continue to repeat daily, until discomfort goes away.  Food intake: Start with clear liquids (like water) and advance to regular food, as tolerated.   Physical activities: Keep activities to a minimum for the first 8 hours after the procedure. After that, then as tolerated.  Driving: If you have received any sedation, be responsible and do not drive. You are not allowed to drive for 24 hours after having sedation.  Blood thinner: (Applies only to those taking blood thinners) You may restart your blood thinner 6 hours after your procedure.  Insulin: (Applies only to Diabetic patients taking insulin) As soon as you can eat, you may resume your normal dosing schedule.  Infection prevention: Keep procedure site clean and dry. Shower daily and clean area with soap and water.  Post-procedure Pain Diary: Extremely important that this be done correctly and accurately. Recorded information will be used to determine the next step in treatment. For the purpose of accuracy, follow these rules:  Evaluate only the area treated. Do not report or include pain from an untreated area. For the purpose of this evaluation, ignore all other areas of pain,  except for the treated area.  After your procedure, avoid taking a long nap and attempting to complete the pain diary after you wake up. Instead, set your alarm clock to go off every hour, on the hour, for the initial 8 hours after the procedure. Document the duration of the numbing medicine, and the relief you are getting from it.  Do not go to sleep and attempt to complete it later. It will not be accurate. If you received sedation, it is likely that you were given a medication that may cause amnesia. Because of this, completing the diary at a later time may cause the information to be inaccurate. This information is needed to plan your care.  Follow-up appointment: Keep your post-procedure follow-up evaluation appointment after the procedure (usually 2 weeks for most procedures, 6 weeks for radiofrequencies). DO NOT FORGET to bring you pain diary with you.   Expect: (What should I expect to see with my procedure?)  From numbing medicine (AKA: Local Anesthetics): Numbness or decrease in pain. You may also experience some weakness, which if present, could last for the duration of the local anesthetic.  Onset: Full effect within 15 minutes of injected.  Duration: It will depend on the type of local anesthetic used. On the average, 1 to 8 hours.   From steroids (Applies only if steroids were used): Decrease in swelling or inflammation. Once inflammation is improved, relief of the pain will follow.  Onset of benefits: Depends on the amount of swelling present. The more swelling, the longer it will take for the benefits to be seen. In some cases, up to 10 days.    Duration: Steroids will stay in the system x 2 weeks. Duration of benefits will depend on multiple posibilities including persistent irritating factors.  Side-effects: If present, they may typically last 2 weeks (the duration of the steroids).  Frequent: Cramps (if they occur, drink Gatorade and take over-the-counter Magnesium 450-500 mg  once to twice a day); water retention with temporary weight gain; increases in blood sugar; decreased immune system response; increased appetite.  Occasional: Facial flushing (red, warm cheeks); mood swings; menstrual changes.  Uncommon: Long-term decrease or suppression of natural hormones; bone thinning. (These are more common with higher doses or more frequent use. This is why we prefer that our patients avoid having any injection therapies in other practices.)   Very Rare: Severe mood changes; psychosis; aseptic necrosis.  From procedure: Some discomfort is to be expected once the numbing medicine wears off. This should be minimal if ice and heat are applied as instructed.  Call if: (When should I call?)  You experience numbness and weakness that gets worse with time, as opposed to wearing off.  New onset bowel or bladder incontinence. (Applies only to procedures done in the spine)  Emergency Numbers:  Durning business hours (Monday - Thursday, 8:00 AM - 4:00 PM) (Friday, 9:00 AM - 12:00 Noon): [phone]  After hours: [phone]  NOTE: If you are having a problem and are unable connect with, or to talk to a provider, then go to your nearest urgent care or emergency department. If the problem is serious and urgent, please call 911. ____________________________________________________________________________________________   Sacroiliac (SI) Joint Injection Patient Information  Description: The sacroiliac joint connects the scrum (very low back and tailbone) to the ilium (a pelvic bone which also forms half of the hip joint).  Normally this joint experiences very little motion.  When this joint becomes inflamed or unstable low back and or hip and pelvis pain may result.  Injection of this joint with local anesthetics (numbing medicines) and steroids can provide diagnostic information and reduce pain.  This injection is performed with the aid of x-ray guidance into the tailbone  area while you are lying on your stomach.   You may experience an electrical sensation down the leg while this is being done.  You may also experience numbness.  We also may ask if we are reproducing your normal pain during the injection.  Conditions which may be treated SI injection:  Low back, buttock, hip or leg pain    Possible side effects:  Bleeding from needle site Infection (rare, may require surgery) Nerve injury (rare) Numbness & tingling (temporary) A brief convulsion or seizure Light-headedness (temporary) Pain at injection site (several days) Decreased blood pressure (temporary) Weakness in the leg (temporary)   Call if you experience:  New onset weakness or numbness of an extremity below the injection site that last more than 8 hours. Hives or difficulty breathing ( go to the emergency room) Inflammation or drainage at the injection site Any new symptoms which are concerning to you  Please note:  Although the local anesthetic injected can often make your back/ hip/ buttock/ leg feel good for several hours after the injections, the pain will likely return.  It takes 3-7 days for steroids to work in the sacroiliac area.  You may not notice any pain relief for at least that one week.  If effective, we will often do a series of three injections spaced 3-6 weeks apart to maximally decrease your pain.  After the initial series, we  generally will wait some months before a repeat injection of the same type.  If you have any questions, please call [phone] [facility]

## 2020-02-11 ENCOUNTER — Telehealth: Payer: Self-pay

## 2020-02-11 NOTE — Telephone Encounter (Signed)
PoSt procedure phone call.  [name], patients daughter states she seems to be doing a little better this morning.

## 2020-02-17 ENCOUNTER — Telehealth: Payer: Self-pay | Admitting: Pain Medicine

## 2020-02-17 NOTE — Telephone Encounter (Signed)
Spoke with Brittney Simpson, ok to give patient Cipro for UTI. Called [place] and let her know.

## 2020-02-17 NOTE — Telephone Encounter (Signed)
Daughter [name] calling about [place], says she UTI, she has standing order for Cipro, also had procedure about a week ago. Will this interfere if she takes Cipro for UTI or does she need to take different anti-biotic. Please call [name] and let her know asap

## 2020-02-18 DIAGNOSIS — M533 Sacrococcygeal disorders, not elsewhere classified: Secondary | ICD-10-CM | POA: Diagnosis not present

## 2020-02-18 DIAGNOSIS — Z9889 Other specified postprocedural states: Secondary | ICD-10-CM | POA: Diagnosis not present

## 2020-02-24 ENCOUNTER — Ambulatory Visit: Payer: Medicare Other | Admitting: Physician Assistant

## 2020-02-28 ENCOUNTER — Telehealth: Payer: Self-pay | Admitting: *Deleted

## 2020-02-28 NOTE — Telephone Encounter (Signed)
Attempted to call for pre appointment review of allergies/meds. Message left with daughter Arbie Cookey.

## 2020-02-29 ENCOUNTER — Other Ambulatory Visit: Payer: Self-pay

## 2020-02-29 ENCOUNTER — Ambulatory Visit: Payer: Medicare Other | Attending: Pain Medicine | Admitting: Pain Medicine

## 2020-02-29 DIAGNOSIS — G8929 Other chronic pain: Secondary | ICD-10-CM | POA: Diagnosis not present

## 2020-02-29 DIAGNOSIS — M79604 Pain in right leg: Secondary | ICD-10-CM

## 2020-02-29 DIAGNOSIS — M5137 Other intervertebral disc degeneration, lumbosacral region: Secondary | ICD-10-CM | POA: Diagnosis not present

## 2020-02-29 DIAGNOSIS — M4317 Spondylolisthesis, lumbosacral region: Secondary | ICD-10-CM | POA: Diagnosis not present

## 2020-02-29 DIAGNOSIS — M51379 Other intervertebral disc degeneration, lumbosacral region without mention of lumbar back pain or lower extremity pain: Secondary | ICD-10-CM

## 2020-02-29 DIAGNOSIS — M545 Low back pain, unspecified: Secondary | ICD-10-CM

## 2020-02-29 DIAGNOSIS — M48062 Spinal stenosis, lumbar region with neurogenic claudication: Secondary | ICD-10-CM

## 2020-02-29 NOTE — Progress Notes (Signed)
Patient: Brittney Simpson  Service Category: E/M  Provider: Gaspar Cola, MD  DOB: Jan 11, 1928  DOS: 02/29/2020  Location: Office  MRN: [id number]  Setting: Ambulatory outpatient  Referring Provider: Nobie Putnam *  Type: Established Patient  Specialty: Interventional Pain Management  PCP: Olin Hauser, DO  Location: Remote location  Delivery: TeleHealth     Virtual Encounter - Pain Management PROVIDER NOTE: Information contained herein reflects review and annotations entered in association with encounter. Interpretation of such information and data should be left to medically-trained personnel. Information provided to patient can be located elsewhere in the medical record under "Patient Instructions". Document created using STT-dictation technology, any transcriptional errors that may result from process are unintentional.    Contact & Pharmacy Preferred: [place]: [phone] (home) Mobile: [phone] (mobile) E-mail: No e-mail address on record  [place], [address] [place] [place] [address] Phone: [phone] Fax: [phone]   Pre-screening  Brittney Simpson offered "in-person" vs "virtual" encounter. She indicated preferring virtual for this encounter.   Reason COVID-19*  Social distancing based on CDC and AMA recommendations.   I contacted Brittney Simpson on 02/29/2020 via telephone.      I clearly identified myself as Gaspar Cola, MD. I verified that I was speaking with the correct person using two identifiers (Name: Brittney Simpson, and date of birth: [birth year]).  Consent I sought verbal advanced consent from Brittney Simpson for virtual visit interactions. I informed Brittney Simpson of possible security and privacy concerns, risks, and limitations associated with providing "not-in-person" medical evaluation and management services. I also informed Brittney Simpson of the availability of "in-person"  appointments. Finally, I informed her that there would be a charge for the virtual visit and that she could be  personally, fully or partially, financially responsible for it. Brittney Simpson expressed understanding and agreed to proceed.   Historic Elements   Brittney Simpson is a 86 y.o. year old, female patient evaluated today after her last contact with our practice on 02/28/2020. Brittney Simpson  has a past medical history of Anxiety, Back ache, Hiatal hernia, Hyperlipidemia, Hypertension, Hypothyroid, Osteopenia, Thyroid disease, and UTI (urinary tract infection). She also  has a past surgical history that includes Hernia repair; Hernia repair; Hemorrhoid surgery; Kyphoplasty (N/A, 01/23/2017); and Kyphoplasty (N/A, 01/25/2020). Brittney Simpson has a current medication list which includes the following prescription(s): acetaminophen, amlodipine, clonazepam, cranberry, vitamin d2, furosemide, hydrocodone-acetaminophen, latanoprost, levothyroxine, lisinopril, melatonin, metoprolol succinate, omeprazole, simethicone, sulfamethoxazole-trimethoprim, tramadol, and trazodone. She  reports that she has never smoked. She has never used smokeless tobacco. She reports that she does not drink alcohol and does not use drugs. Brittney Simpson has No Known Allergies.   HPI  Today, she is being contacted for a post-procedure assessment.  Unfortunately, the last procedure that we did for this patient did not provide her with any long-term benefit.  When reviewing the patient's MRI we see that she has some pathology around the L3-4 that could be responsible for some of her current symptoms.  I will be bringing her back as soon as possible for a right L3-4 LESI under fluoroscopic guidance.  The plan of care was discussed with the patient is daughter who has the power of attorney for the patient.  Post-Procedure Evaluation  Procedure (02/17/2020): Diagnostic bilateral SI joint block #1 + diagnostic right intra-articular hip joint  injection under fluoroscopic guidance and IV sedation Pre-procedure pain level: 9/10 Post-procedure: 6/10 (<  50% relief)  Sedation: Sedation provided.  Effectiveness during initial hour after procedure(Ultra-Short Term Relief): 100 %.  Local anesthetic used: Long-acting (4-6 hours) Effectiveness: Defined as any analgesic benefit obtained secondary to the administration of local anesthetics. This carries significant diagnostic value as to the etiological location, or anatomical origin, of the pain. Duration of benefit is expected to coincide with the duration of the local anesthetic used.  Effectiveness during initial 4-6 hours after procedure(Short-Term Relief): 100 %.  Long-term benefit: Defined as any relief past the pharmacologic duration of the local anesthetics.  Effectiveness past the initial 6 hours after procedure(Long-Term Relief): 100 % (good pain relief x 2 days but since that time the pain has gotten worse by the day.).  Current benefits: Defined as benefit that persist at this time.   Analgesia:  Back to baseline Function: Back to baseline ROM: Back to baseline  Pharmacotherapy Assessment  Analgesic: Tramadol 50 mg, 1 tab PO q 8 hrs (150 mg/day of tramadol) MME/day: 15 mg/day.   Monitoring: [place] PMP: PDMP reviewed during this encounter.       Pharmacotherapy: No side-effects or adverse reactions reported. Compliance: No problems identified. Effectiveness: Clinically acceptable. Plan: Refer to "POC".  UDS:  Summary  Date Value Ref Range Status  10/28/2017 FINAL  Final    Comment:    ==================================================================== TOXASSURE SELECT 13 (MW) ==================================================================== Test                             Result       Flag       Units Drug Present and Declared for Prescription Verification   Tramadol                       >20000       EXPECTED   ng/mg creat   O-Desmethyltramadol            >20000        EXPECTED   ng/mg creat   N-Desmethyltramadol            8244         EXPECTED   ng/mg creat    Source of tramadol is a prescription medication.    O-desmethyltramadol and N-desmethyltramadol are expected    metabolites of tramadol. Drug Present not Declared for Prescription Verification   7-aminoclonazepam              248          UNEXPECTED ng/mg creat    7-aminoclonazepam is an expected metabolite of clonazepam. Source    of clonazepam is a scheduled prescription medication. ==================================================================== Test                      Result    Flag   Units      Ref Range   Creatinine              25               mg/dL      >=20 ==================================================================== Declared Medications:  The flagging and interpretation on this report are based on the  following declared medications.  Unexpected results may arise from  inaccuracies in the declared medications.  **Note: The testing scope of this panel includes these medications:  Tramadol  **Note: The testing scope of this panel does not include following  reported medications:  Acetaminophen  Amlodipine  Brimonidine Tartrate  Cranberry Extract  Furosemide  Latanoprost  Levothyroxine  Lidocaine  Lisinopril  Metoprolol  Omeprazole  Timolol  Trazodone  Vitamin D2 ==================================================================== For clinical consultation, please call [phone]. ====================================================================     Laboratory Chemistry Profile   Renal Lab Results  Component Value Date   BUN 11 05/10/2019   CREATININE 0.97 (H) 05/10/2019   BCR 11 05/10/2019   GFRAA 60 05/10/2019   GFRNONAA 52 (L) 05/10/2019     Hepatic Lab Results  Component Value Date   AST 12 05/10/2019   ALT 9 05/10/2019   ALBUMIN 3.6 11/22/2016   ALKPHOS 59 11/22/2016   LIPASE 21 06/14/2016     Electrolytes Lab Results   Component Value Date   NA 137 05/10/2019   K 4.0 05/10/2019   CL 98 05/10/2019   CALCIUM 9.7 05/10/2019   MG 2.1 01/14/2014     Bone No results found for: VD25OH, VD125OH2TOT, NT6144RX5, QM0867YP9, 25OHVITD1, 25OHVITD2, 25OHVITD3, TESTOFREE, TESTOSTERONE   Inflammation (CRP: Acute Phase) (ESR: Chronic Phase) No results found for: CRP, ESRSEDRATE, LATICACIDVEN     Note: Above Lab results reviewed.   Imaging  DG PAIN CLINIC C-ARM 1-60 MIN NO REPORT Fluoro was used, but no Radiologist interpretation will be provided.  Please refer to "NOTES" tab for provider progress note.  Assessment  The primary encounter diagnosis was DDD (degenerative disc disease), lumbosacral. Diagnoses of Chronic low back pain (Primary Area of Pain) (Bilateral) (R>L), Chronic lower extremity pain (Third area of Pain) (Right), Spinal stenosis of lumbar region with neurogenic claudication, and Spondylolisthesis of lumbosacral region (L2-3 and L5-S1) were also pertinent to this visit.  Plan of Care  Problem-specific:  No problem-specific Assessment & Plan notes found for this encounter.  Brittney Simpson has a current medication list which includes the following long-term medication(s): amlodipine, clonazepam, furosemide, levothyroxine, lisinopril, metoprolol succinate, omeprazole, simethicone, tramadol, and trazodone.  Pharmacotherapy (Medications Ordered): No orders of the defined types were placed in this encounter.  Orders:  Orders Placed This Encounter  Procedures  . Lumbar Epidural Injection    Standing Status:   Future    Standing Expiration Date:   03/31/2020    Scheduling Instructions:     Procedure: Interlaminar Lumbar Epidural Steroid injection (LESI)  L3-4     Laterality: Right-sided     Sedation: None required.     Timeframe: ASAP    Order Specific Question:   Where will this procedure be performed?    Answer:   [facility]   Follow-up plan:   Return for Procedure (no  sedation): (R) L3-4 LESI #1.      Interventional management options:  Considering: Possible bilateral lumbar facet RFA. (Offerred several times. Pt. chooses to hold.) Diagnostic right hip joint injection Diagnostic right L3 TFESI Diagnostic right trochanteric bursa injection Diagnostic right SI joint block Diagnostic midline caudal ESI (for the tailbone pain)   Palliative PRN treatment(s): Palliativeright lumbar facet block #12 Palliativeleft lumbar facet block #10 Palliative right L3-4 LESI         Recent Visits Date Type Provider Dept  02/10/20 Procedure visit Milinda Pointer, MD [facility]  02/07/20 Telemedicine Milinda Pointer, MD [facility]  01/04/20 Procedure visit Milinda Pointer, MD [facility]  Showing recent visits within past 90 days and meeting all other requirements Today's Visits Date Type Provider Dept  02/29/20 Telemedicine Milinda Pointer, MD [facility]  Showing today's visits and meeting all other requirements Future Appointments Date Type Provider Dept  04/19/20 Appointment Milinda Pointer, MD [facility]  Showing future appointments within next 90 days and meeting all other requirements  I discussed the assessment and treatment plan with the patient. The patient was provided an opportunity to ask questions and all were answered. The patient agreed with the plan and demonstrated an understanding of the instructions.  Patient advised to call back or seek an in-person evaluation if the symptoms or condition worsens.  Duration of encounter: 18 minutes.  Note by: Gaspar Cola, MD Date: 02/29/2020; Time: 5:44 PM

## 2020-02-29 NOTE — Patient Instructions (Addendum)
____________________________________________________________________________________________  Muscle Spasms & Cramps  Cause:  The most common cause of muscle spasms and cramps is vitamin and/or electrolyte (calcium, potassium, sodium, etc.) deficiencies.  Possible triggers: Sweating - causes loss of electrolytes thru the skin. Steroids - causes loss of electrolytes thru the urine.  Treatment: 1. Gatorade (or any other electrolyte-replenishing drink) - Take 1, 8 oz glass with each meal (3 times a day). 2. OTC (over-the-counter) Magnesium 400 to 500 mg - Take 1 tablet twice a day (one with breakfast and one before bedtime). If you have kidney problems, talk to your primary care physician before taking any Magnesium. 3. Tonic Water with quinine - Take 1, 8 oz glass before bedtime.   ____________________________________________________________________________________________   ____________________________________________________________________________________________  Preparing for your procedure (without sedation)  Procedure appointments are limited to planned procedures: . No Prescription Refills. . No disability issues will be discussed. . No medication changes will be discussed.  Instructions: . Oral Intake: Do not eat or drink anything for at least 6 hours prior to your procedure. (Exception: Blood Pressure Medication. See below.) . Transportation: Unless otherwise stated by your physician, you may drive yourself after the procedure. . Blood Pressure Medicine: Do not forget to take your blood pressure medicine with a sip of water the morning of the procedure. If your Diastolic (lower reading)is above 100 mmHg, elective cases will be cancelled/rescheduled. . Blood thinners: These will need to be stopped for procedures. Notify our staff if you are taking any blood thinners. Depending on which one you take, there will be specific instructions on how and when to stop it. . Diabetics on  insulin: Notify the staff so that you can be scheduled 1st case in the morning. If your diabetes requires high dose insulin, take only  of your normal insulin dose the morning of the procedure and notify the staff that you have done so. . Preventing infections: Shower with an antibacterial soap the morning of your procedure.  . Build-up your immune system: Take 1000 mg of Vitamin C with every meal (3 times a day) the day prior to your procedure. Marland Kitchen Antibiotics: Inform the staff if you have a condition or reason that requires you to take antibiotics before dental procedures. . Pregnancy: If you are pregnant, call and cancel the procedure. . Sickness: If you have a cold, fever, or any active infections, call and cancel the procedure. . Arrival: You must be in the facility at least 30 minutes prior to your scheduled procedure. . Children: Do not bring any children with you. . Dress appropriately: Bring dark clothing that you would not mind if they get stained. . Valuables: Do not bring any jewelry or valuables.  Reasons to call and reschedule or cancel your procedure: (Following these recommendations will minimize the risk of a serious complication.) . Surgeries: Avoid having procedures within 2 weeks of any surgery. (Avoid for 2 weeks before or after any surgery). . Flu Shots: Avoid having procedures within 2 weeks of a flu shots or . (Avoid for 2 weeks before or after immunizations). . Barium: Avoid having a procedure within 7-10 days after having had a radiological study involving the use of radiological contrast. (Myelograms, Barium swallow or enema study). . Heart attacks: Avoid any elective procedures or surgeries for the initial 6 months after a "Myocardial Infarction" (Heart Attack). . Blood thinners: It is imperative that you stop these medications before procedures. Let us know if you if you take any blood thinner.  . Infection: Avoid procedures  during or within two weeks of an infection  (including chest colds or gastrointestinal problems). Symptoms associated with infections include: Localized redness, fever, chills, night sweats or profuse sweating, burning sensation when voiding, cough, congestion, stuffiness, runny nose, sore throat, diarrhea, nausea, vomiting, cold or Flu symptoms, recent or current infections. It is specially important if the infection is over the area that we intend to treat. Marland Kitchen Heart and lung problems: Symptoms that may suggest an active cardiopulmonary problem include: cough, chest pain, breathing difficulties or shortness of breath, dizziness, ankle swelling, uncontrolled high or unusually low blood pressure, and/or palpitations. If you are experiencing any of these symptoms, cancel your procedure and contact your primary care physician for an evaluation.  Remember:  Regular Business hours are:  Monday to Thursday 8:00 AM to 4:00 PM  Provider's Schedule: Milinda Pointer, MD:  Procedure days: Tuesday and Thursday 7:30 AM to 4:00 PM  Gillis Santa, MD:  Procedure days: Monday and Wednesday 7:30 AM to 4:00 PM ____________________________________________________________________________________________

## 2020-03-07 ENCOUNTER — Encounter: Payer: Self-pay | Admitting: Pain Medicine

## 2020-03-07 ENCOUNTER — Other Ambulatory Visit: Payer: Self-pay

## 2020-03-07 ENCOUNTER — Ambulatory Visit (HOSPITAL_BASED_OUTPATIENT_CLINIC_OR_DEPARTMENT_OTHER): Payer: Medicare Other | Admitting: Pain Medicine

## 2020-03-07 ENCOUNTER — Ambulatory Visit
Admission: RE | Admit: 2020-03-07 | Discharge: 2020-03-07 | Disposition: A | Discharge status: Home / Self Care | Payer: Medicare Other | Source: Ambulatory Visit | Attending: Pain Medicine | Admitting: Pain Medicine

## 2020-03-07 VITALS — BP 170/91 | HR 85 | Temp 97.2°F | Resp 18 | Ht 65.0 in | Wt 126.0 lb

## 2020-03-07 DIAGNOSIS — M48062 Spinal stenosis, lumbar region with neurogenic claudication: Secondary | ICD-10-CM | POA: Insufficient documentation

## 2020-03-07 DIAGNOSIS — S22080S Wedge compression fracture of T11-T12 vertebra, sequela: Secondary | ICD-10-CM | POA: Insufficient documentation

## 2020-03-07 DIAGNOSIS — M79604 Pain in right leg: Secondary | ICD-10-CM | POA: Diagnosis not present

## 2020-03-07 DIAGNOSIS — M5416 Radiculopathy, lumbar region: Secondary | ICD-10-CM | POA: Insufficient documentation

## 2020-03-07 DIAGNOSIS — M5137 Other intervertebral disc degeneration, lumbosacral region: Secondary | ICD-10-CM

## 2020-03-07 DIAGNOSIS — G8929 Other chronic pain: Secondary | ICD-10-CM | POA: Insufficient documentation

## 2020-03-07 DIAGNOSIS — M545 Low back pain, unspecified: Secondary | ICD-10-CM

## 2020-03-07 DIAGNOSIS — M4317 Spondylolisthesis, lumbosacral region: Secondary | ICD-10-CM | POA: Insufficient documentation

## 2020-03-07 MED ORDER — LIDOCAINE HCL 2 % IJ SOLN
INTRAMUSCULAR | Status: AC
  Filled 2020-03-07: qty 20

## 2020-03-07 MED ORDER — IOHEXOL 180 MG/ML  SOLN
10.0000 mL | Freq: Once | INTRAMUSCULAR | Status: AC
  Administered 2020-03-07: 10 mL via EPIDURAL

## 2020-03-07 MED ORDER — SODIUM CHLORIDE 0.9% FLUSH
2.0000 mL | Freq: Once | INTRAVENOUS | Status: AC
  Administered 2020-03-07: 2 mL

## 2020-03-07 MED ORDER — ROPIVACAINE HCL 2 MG/ML IJ SOLN
INTRAMUSCULAR | Status: AC
  Filled 2020-03-07: qty 10

## 2020-03-07 MED ORDER — SODIUM CHLORIDE (PF) 0.9 % IJ SOLN
INTRAMUSCULAR | Status: AC
  Filled 2020-03-07: qty 10

## 2020-03-07 MED ORDER — ROPIVACAINE HCL 2 MG/ML IJ SOLN
2.0000 mL | Freq: Once | INTRAMUSCULAR | Status: AC
  Administered 2020-03-07: 2 mL via EPIDURAL

## 2020-03-07 MED ORDER — TRIAMCINOLONE ACETONIDE 40 MG/ML IJ SUSP
40.0000 mg | Freq: Once | INTRAMUSCULAR | Status: AC
  Administered 2020-03-07: 40 mg

## 2020-03-07 MED ORDER — TRIAMCINOLONE ACETONIDE 40 MG/ML IJ SUSP
INTRAMUSCULAR | Status: AC
  Filled 2020-03-07: qty 1

## 2020-03-07 MED ORDER — LIDOCAINE HCL 2 % IJ SOLN
20.0000 mL | Freq: Once | INTRAMUSCULAR | Status: AC
  Administered 2020-03-07: 400 mg

## 2020-03-07 NOTE — Patient Instructions (Addendum)
____________________________________________________________________________________________  Post-Procedure Discharge Instructions  Instructions:  Apply ice:   Purpose: This will minimize any swelling and discomfort after procedure.   When: Day of procedure, as soon as you get home.  How: Fill a plastic sandwich bag with crushed ice. Cover it with a small towel and apply to injection site.  How long: (15 min on, 15 min off) Apply for 15 minutes then remove x 15 minutes.  Repeat sequence on day of procedure, until you go to bed.  Apply heat:   Purpose: To treat any soreness and discomfort from the procedure.  When: Starting the next day after the procedure.  How: Apply heat to procedure site starting the day following the procedure.  How long: May continue to repeat daily, until discomfort goes away.  Food intake: Start with clear liquids (like water) and advance to regular food, as tolerated.   Physical activities: Keep activities to a minimum for the first 8 hours after the procedure. After that, then as tolerated.  Driving: If you have received any sedation, be responsible and do not drive. You are not allowed to drive for 24 hours after having sedation.  Blood thinner: (Applies only to those taking blood thinners) You may restart your blood thinner 6 hours after your procedure.  Insulin: (Applies only to Diabetic patients taking insulin) As soon as you can eat, you may resume your normal dosing schedule.  Infection prevention: Keep procedure site clean and dry. Shower daily and clean area with soap and water.  Post-procedure Pain Diary: Extremely important that this be done correctly and accurately. Recorded information will be used to determine the next step in treatment. For the purpose of accuracy, follow these rules:  Evaluate only the area treated. Do not report or include pain from an untreated area. For the purpose of this evaluation, ignore all other areas of pain,  except for the treated area.  After your procedure, avoid taking a long nap and attempting to complete the pain diary after you wake up. Instead, set your alarm clock to go off every hour, on the hour, for the initial 8 hours after the procedure. Document the duration of the numbing medicine, and the relief you are getting from it.  Do not go to sleep and attempt to complete it later. It will not be accurate. If you received sedation, it is likely that you were given a medication that may cause amnesia. Because of this, completing the diary at a later time may cause the information to be inaccurate. This information is needed to plan your care.  Follow-up appointment: Keep your post-procedure follow-up evaluation appointment after the procedure (usually 2 weeks for most procedures, 6 weeks for radiofrequencies). DO NOT FORGET to bring you pain diary with you.   Expect: (What should I expect to see with my procedure?)  From numbing medicine (AKA: Local Anesthetics): Numbness or decrease in pain. You may also experience some weakness, which if present, could last for the duration of the local anesthetic.  Onset: Full effect within 15 minutes of injected.  Duration: It will depend on the type of local anesthetic used. On the average, 1 to 8 hours.   From steroids (Applies only if steroids were used): Decrease in swelling or inflammation. Once inflammation is improved, relief of the pain will follow.  Onset of benefits: Depends on the amount of swelling present. The more swelling, the longer it will take for the benefits to be seen. In some cases, up to 10 days.    Duration: Steroids will stay in the system x 2 weeks. Duration of benefits will depend on multiple posibilities including persistent irritating factors.  Side-effects: If present, they may typically last 2 weeks (the duration of the steroids).  Frequent: Cramps (if they occur, drink Gatorade and take over-the-counter Magnesium 450-500 mg  once to twice a day); water retention with temporary weight gain; increases in blood sugar; decreased immune system response; increased appetite.  Occasional: Facial flushing (red, warm cheeks); mood swings; menstrual changes.  Uncommon: Long-term decrease or suppression of natural hormones; bone thinning. (These are more common with higher doses or more frequent use. This is why we prefer that our patients avoid having any injection therapies in other practices.)   Very Rare: Severe mood changes; psychosis; aseptic necrosis.  From procedure: Some discomfort is to be expected once the numbing medicine wears off. This should be minimal if ice and heat are applied as instructed.  Call if: (When should I call?)  You experience numbness and weakness that gets worse with time, as opposed to wearing off.  New onset bowel or bladder incontinence. (Applies only to procedures done in the spine)  Emergency Numbers:  Durning business hours (Monday - Thursday, 8:00 AM - 4:00 PM) (Friday, 9:00 AM - 12:00 Noon): [phone]  After hours: [phone]  NOTE: If you are having a problem and are unable connect with, or to talk to a provider, then go to your nearest urgent care or emergency department. If the problem is serious and urgent, please call 911. ____________________________________________________________________________________________   Pain Management Discharge Instructions  General Discharge Instructions :  If you need to reach your doctor call: Monday-Friday 8:00 am - 4:00 pm at [phone] or toll free [phone].  After clinic hours [phone] to have operator reach doctor.  Bring all of your medication bottles to all your appointments in the pain clinic.  To cancel or reschedule your appointment with Pain Management please remember to call 24 hours in advance to avoid a fee.  Refer to the educational materials which you have been given on: General Risks, I had my  Procedure. Discharge Instructions, Post Sedation.  Post Procedure Instructions:  The drugs you were given will stay in your system until tomorrow, so for the next 24 hours you should not drive, make any legal decisions or drink any alcoholic beverages.  You may eat anything you prefer, but it is better to start with liquids then soups and crackers, and gradually work up to solid foods.  Please notify your doctor immediately if you have any unusual bleeding, trouble breathing or pain that is not related to your normal pain.  Depending on the type of procedure that was done, some parts of your body may feel week and/or numb.  This usually clears up by tonight or the next day.  Walk with the use of an assistive device or accompanied by an adult for the 24 hours.  You may use ice on the affected area for the first 24 hours.  Put ice in a Ziploc bag and cover with a towel and place against area 15 minutes on 15 minutes off.  You may switch to heat after 24 hours.Epidural Steroid Injection Patient Information  Description: The epidural space surrounds the nerves as they exit the spinal cord.  In some patients, the nerves can be compressed and inflamed by a bulging disc or a tight spinal canal (spinal stenosis).  By injecting steroids into the epidural space, we can bring irritated nerves   into direct contact with a potentially helpful medication.  These steroids act directly on the irritated nerves and can reduce swelling and inflammation which often leads to decreased pain.  Epidural steroids may be injected anywhere along the spine and from the neck to the low back depending upon the location of your pain.   After numbing the skin with local anesthetic (like Novocaine), a small needle is passed into the epidural space slowly.  You may experience a sensation of pressure while this is being done.  The entire block usually last less than 10 minutes.  Conditions which may be treated by epidural  steroids:   Low back and leg pain  Neck and arm pain  Spinal stenosis  Post-laminectomy syndrome  Herpes zoster (shingles) pain  Pain from compression fractures  Preparation for the injection:  1. Do not eat any solid food or dairy products within 8 hours of your appointment.  2. You may drink clear liquids up to 3 hours before appointment.  Clear liquids include water, black coffee, juice or soda.  No milk or cream please. 3. You may take your regular medication, including pain medications, with a sip of water before your appointment  Diabetics should hold regular insulin (if taken separately) and take 1/2 normal NPH dos the morning of the procedure.  Carry some sugar containing items with you to your appointment. 4. A driver must accompany you and be prepared to drive you home after your procedure.  5. Bring all your current medications with your. 6. An IV may be inserted and sedation may be given at the discretion of the physician.   7. A blood pressure cuff, EKG and other monitors will often be applied during the procedure.  Some patients may need to have extra oxygen administered for a short period. 8. You will be asked to provide medical information, including your allergies, prior to the procedure.  We must know immediately if you are taking blood thinners (like Coumadin/Warfarin)  Or if you are allergic to IV iodine contrast (dye). We must know if you could possible be pregnant.  Possible side-effects:  Bleeding from needle site  Infection (rare, may require surgery)  Nerve injury (rare)  Numbness & tingling (temporary)  Difficulty urinating (rare, temporary)  Spinal headache ( a headache worse with upright posture)  Light -headedness (temporary)  Pain at injection site (several days)  Decreased blood pressure (temporary)  Weakness in arm/leg (temporary)  Pressure sensation in back/neck (temporary)  Call if you experience:  Fever/chills associated with  headache or increased back/neck pain.  Headache worsened by an upright position.  New onset weakness or numbness of an extremity below the injection site  Hives or difficulty breathing (go to the emergency room)  Inflammation or drainage at the infection site  Severe back/neck pain  Any new symptoms which are concerning to you  Please note:  Although the local anesthetic injected can often make your back or neck feel good for several hours after the injection, the pain will likely return.  It takes 3-7 days for steroids to work in the epidural space.  You may not notice any pain relief for at least that one week.  If effective, we will often do a series of three injections spaced 3-6 weeks apart to maximally decrease your pain.  After the initial series, we generally will wait several months before considering a repeat injection of the same type.  If you have any questions, please call [phone] [facility] 

## 2020-03-07 NOTE — Progress Notes (Signed)
Safety precautions to be maintained throughout the outpatient stay will include: orient to surroundings, keep bed in low position, maintain call bell within reach at all times, provide assistance with transfer out of bed and ambulation.  

## 2020-03-07 NOTE — Progress Notes (Signed)
PROVIDER NOTE: Information contained herein reflects review and annotations entered in association with encounter. Interpretation of such information and data should be left to medically-trained personnel. Information provided to patient can be located elsewhere in the medical record under "Patient Instructions". Document created using STT-dictation technology, any transcriptional errors that may result from process are unintentional.    Patient: Brittney Simpson  Service Category: Procedure  Provider: Gaspar Cola, MD  DOB: 19-Sep-1927  DOS: 03/07/2020  Location: [facility]  MRN: [id number]  Setting: Ambulatory - outpatient  Referring Provider: Nobie Putnam *  Type: Established Patient  Specialty: Interventional Pain Management  PCP: Olin Hauser, DO   Primary Reason for Visit: Interventional Pain Management Treatment. CC: Back Pain (lower)  Procedure:          Anesthesia, Analgesia, Anxiolysis:  Type: Therapeutic Inter-Laminar Epidural Steroid Injection  #1  Region: Lumbar Level: L2-3 Level. Laterality: Right Paramedial  Type: Local Anesthesia Indication(s): Analgesia         Route: Infiltration ([place]/IM) IV Access: Declined Sedation: Declined  Local Anesthetic: Lidocaine 1-2%  Position: Prone with head of the table was raised to facilitate breathing.   Indications: 1. DDD (degenerative disc disease), lumbosacral   2. Chronic low back pain (Primary Area of Pain) (Bilateral) (R>L)   3. Chronic lower extremity pain (Third area of Pain) (Right)   4. Chronic lumbar radicular pain (Right)   5. Spinal stenosis of lumbar region with neurogenic claudication   6. Spondylolisthesis of lumbosacral region (L2-3 and L5-S1)   7. T12 compression fracture, sequela    Pain Score: Pre-procedure: 10-Worst pain ever/10 Post-procedure: 0-No pain/10   Pre-op Assessment:  Brittney Simpson is a 86 y.o. (year old), female patient, seen today for interventional  treatment. She  has a past surgical history that includes Hernia repair; Hernia repair; Hemorrhoid surgery; Kyphoplasty (N/A, 01/23/2017); and Kyphoplasty (N/A, 01/25/2020). Brittney Simpson has a current medication list which includes the following prescription(s): acetaminophen, amlodipine, clonazepam, cranberry, vitamin d2, furosemide, hydrocodone-acetaminophen, latanoprost, levothyroxine, lisinopril, melatonin, metoprolol succinate, omeprazole, simethicone, sulfamethoxazole-trimethoprim, tramadol, and trazodone. Her primarily concern today is the Back Pain (lower)  Initial Vital Signs:  Pulse/HCG Rate: 85ECG Heart Rate: 87 Temp: (!) 97.2 F (36.2 C) Resp: 16 BP: (!) 149/88 SpO2: 98 %  BMI: Estimated body mass index is 20.97 kg/m as calculated from the following:   Height as of this encounter: 5\' 5"  (1.651 m).   Weight as of this encounter: 126 lb (57.2 kg).  Risk Assessment: Allergies: Reviewed. She has No Known Allergies.  Allergy Precautions: None required Coagulopathies: Reviewed. None identified.  Blood-thinner therapy: None at this time Active Infection(s): Reviewed. None identified. Brittney Simpson is afebrile  Site Confirmation: Brittney Simpson was asked to confirm the procedure and laterality before marking the site Procedure checklist: Completed Consent: Before the procedure and under the influence of no sedative(s), amnesic(s), or anxiolytics, the patient was informed of the treatment options, risks and possible complications. To fulfill our ethical and legal obligations, as recommended by the American Medical Association's Code of Ethics, I have informed the patient of my clinical impression; the nature and purpose of the treatment or procedure; the risks, benefits, and possible complications of the intervention; the alternatives, including doing nothing; the risk(s) and benefit(s) of the alternative treatment(s) or procedure(s); and the risk(s) and benefit(s) of doing nothing. The  patient was provided information about the general risks and possible complications associated with the procedure. These may include, but are not limited to:  failure to achieve desired goals, infection, bleeding, organ or nerve damage, allergic reactions, paralysis, and death. In addition, the patient was informed of those risks and complications associated to Spine-related procedures, such as failure to decrease pain; infection (i.e.: Meningitis, epidural or intraspinal abscess); bleeding (i.e.: epidural hematoma, subarachnoid hemorrhage, or any other type of intraspinal or peri-dural bleeding); organ or nerve damage (i.e.: Any type of peripheral nerve, nerve root, or spinal cord injury) with subsequent damage to sensory, motor, and/or autonomic systems, resulting in permanent pain, numbness, and/or weakness of one or several areas of the body; allergic reactions; (i.e.: anaphylactic reaction); and/or death. Furthermore, the patient was informed of those risks and complications associated with the medications. These include, but are not limited to: allergic reactions (i.e.: anaphylactic or anaphylactoid reaction(s)); adrenal axis suppression; blood sugar elevation that in diabetics may result in ketoacidosis or comma; water retention that in patients with history of congestive heart failure may result in shortness of breath, pulmonary edema, and decompensation with resultant heart failure; weight gain; swelling or edema; medication-induced neural toxicity; particulate matter embolism and blood vessel occlusion with resultant organ, and/or nervous system infarction; and/or aseptic necrosis of one or more joints. Finally, the patient was informed that Medicine is not an exact science; therefore, there is also the possibility of unforeseen or unpredictable risks and/or possible complications that may result in a catastrophic outcome. The patient indicated having understood very clearly. We have given the patient no  guarantees and we have made no promises. Enough time was given to the patient to ask questions, all of which were answered to the patient's satisfaction. Brittney Simpson has indicated that she wanted to continue with the procedure. Attestation: I, the ordering provider, attest that I have discussed with the patient the benefits, risks, side-effects, alternatives, likelihood of achieving goals, and potential problems during recovery for the procedure that I have provided informed consent. Date  Time: 03/07/2020  7:59 AM  Pre-Procedure Preparation:  Monitoring: As per clinic protocol. Respiration, ETCO2, SpO2, BP, heart rate and rhythm monitor placed and checked for adequate function Safety Precautions: Patient was assessed for positional comfort and pressure points before starting the procedure. Time-out: I initiated and conducted the "Time-out" before starting the procedure, as per protocol. The patient was asked to participate by confirming the accuracy of the "Time Out" information. Verification of the correct person, site, and procedure were performed and confirmed by me, the nursing staff, and the patient. "Time-out" conducted as per Joint Commission's Universal Protocol (UP.01.01.01). Time: 0827  Description of Procedure:          Target Area: The interlaminar space, initially targeting the lower laminar border of the superior vertebral body. Approach: Paramedial approach. Area Prepped: Entire Posterior Lumbar Region DuraPrep (Iodine Povacrylex [0.7% available iodine] and Isopropyl Alcohol, 74% w/w) Safety Precautions: Aspiration looking for blood return was conducted prior to all injections. At no point did we inject any substances, as a needle was being advanced. No attempts were made at seeking any paresthesias. Safe injection practices and needle disposal techniques used. Medications properly checked for expiration dates. SDV (single dose vial) medications used.            Description of  the Procedure: Protocol guidelines were followed. The procedure needle was introduced through the skin, ipsilateral to the reported pain, and advanced to the target area. Bone was contacted and the needle walked caudad, until the lamina was cleared. The epidural space was identified using "loss-of-resistance technique" with 2-3 ml of PF-NaCl (  0.9% NSS), in a 5cc LOR glass syringe.  Vitals:   03/07/20 0827 03/07/20 0832 03/07/20 0837 03/07/20 0842  BP: (!) 164/84 (!) 180/88 (!) 177/86 (!) 170/91  Pulse:      Resp: 13 16 18 18   Temp:      TempSrc:      SpO2: 97% 96% 96% 96%  Weight:      Height:        Start Time: 0827 hrs. End Time: 0842 hrs.  Materials:  Needle(s) Type: Epidural needle Gauge: 17G Length: 3.5-in Medication(s): Please see orders for medications and dosing details.  Imaging Guidance (Spinal):          Type of Imaging Technique: Fluoroscopy Guidance (Spinal) Indication(s): Assistance in needle guidance and placement for procedures requiring needle placement in or near specific anatomical locations not easily accessible without such assistance. Exposure Time: Please see nurses notes. Contrast: Before injecting any contrast, we confirmed that the patient did not have an allergy to iodine, shellfish, or radiological contrast. Once satisfactory needle placement was completed at the desired level, radiological contrast was injected. Contrast injected under live fluoroscopy. No contrast complications. See chart for type and volume of contrast used. Fluoroscopic Guidance: I was personally present during the use of fluoroscopy. "Tunnel Vision Technique" used to obtain the best possible view of the target area. Parallax error corrected before commencing the procedure. "Direction-depth-direction" technique used to introduce the needle under continuous pulsed fluoroscopy. Once target was reached, antero-posterior, oblique, and lateral fluoroscopic projection used confirm needle  placement in all planes. Images permanently stored in EMR. Interpretation: I personally interpreted the imaging intraoperatively. Adequate needle placement confirmed in multiple planes. Appropriate spread of contrast into desired area was observed. No evidence of afferent or efferent intravascular uptake. No intrathecal or subarachnoid spread observed. Permanent images saved into the patient's record.  Antibiotic Prophylaxis:   Anti-infectives (From admission, onward)   None     Indication(s): None identified  Post-operative Assessment:  Post-procedure Vital Signs:  Pulse/HCG Rate: 8589 Temp: (!) 97.2 F (36.2 C) Resp: 18 BP: (!) 170/91 SpO2: 96 %  EBL: None  Complications: No immediate post-treatment complications observed by team, or reported by patient.  Note: The patient tolerated the entire procedure well. A repeat set of vitals were taken after the procedure and the patient was kept under observation following institutional policy, for this type of procedure. Post-procedural neurological assessment was performed, showing return to baseline, prior to discharge. The patient was provided with post-procedure discharge instructions, including a section on how to identify potential problems. Should any problems arise concerning this procedure, the patient was given instructions to immediately contact us, at any time, without hesitation. In any case, we plan to contact the patient by telephone for a follow-up status report regarding this interventional procedure.  Comments:  No additional relevant information.  Plan of Care  Orders:  Orders Placed This Encounter  Procedures  . Lumbar Epidural Injection    Scheduling Instructions:     Procedure: Interlaminar LESI L3-4     Laterality: Right-sided     Sedation: Patient's choice     Timeframe:  Today    Order Specific Question:   Where will this procedure be performed?    Answer:   [facility]  . [facility] C-ARM 1-60  MIN NO REPORT    Intraoperative interpretation by procedural physician at [facility].    Standing Status:   Standing    Number of Occurrences:   1  Order Specific Question:   Reason for exam:    Answer:   Assistance in needle guidance and placement for procedures requiring needle placement in or near specific anatomical locations not easily accessible without such assistance.  . Informed Consent Details: Physician/Practitioner Attestation; Transcribe to consent form and obtain patient signature    Provider Attestation: I, [place] D[place]ie Arbour, MD, (Pain Management Specialist), the physician/practitioner, attest that I have discussed with the patient the benefits, risks, side effects, alternatives, likelihood of achieving goals and potential problems during recovery for the procedure that I have provided informed consent.    Scheduling Instructions:     Procedure: Lumbar epidural steroid injection under fluoroscopic guidance     Indications: Low back and/or lower extremity pain secondary to lumbar radiculitis     Note: Always confirm laterality of pain with Ms. Hernandez, before procedure.     Transcribe to consent form and obtain patient signature.  . Provide equipment / supplies at bedside    Equipment required: Single use, disposable, "Epidural Tray" Epidural Catheter: NOT required    Standing Status:   Standing    Number of Occurrences:   1    Order Specific Question:   Specify    Answer:   Epidural Tray   Chronic Opioid Analgesic:  Tramadol 50 mg, 1 tab PO q 8 hrs (150 mg/day of tramadol) MME/day: 15 mg/day.   Medications ordered for procedure: Meds ordered this encounter  Medications  . iohexol (OMNIPAQUE) 180 MG/ML injection 10 mL    Must be Myelogram-compatible. If not available, you may substitute with a water-soluble, non-ionic, hypoallergenic, myelogram-compatible radiological contrast medium.  Marland Kitchen lidocaine (XYLOCAINE) 2 % (with pres) injection 400 mg  . sodium  chloride flush (NS) 0.9 % injection 2 mL  . ropivacaine (PF) 2 mg/mL (0.2%) (NAROPIN) injection 2 mL  . triamcinolone acetonide (KENALOG-40) injection 40 mg   Medications administered: We administered iohexol, lidocaine, sodium chloride flush, ropivacaine (PF) 2 mg/mL (0.2%), and triamcinolone acetonide.  See the medical record for exact dosing, route, and time of administration.  Follow-up plan:   Return for (20-min), (F2F), (PP) Follow-up, PM on Proc-day.       Interventional management options:  Considering: Possible bilateral lumbar facet RFA. (Offerred several times. Pt. chooses to hold.) Diagnostic right hip joint injection Diagnostic right L3 TFESI Diagnostic right trochanteric bursa injection Diagnostic right SI joint block Diagnostic midline caudal ESI (for the tailbone pain)   Palliative PRN treatment(s): Palliativeright lumbar facet block #12 Palliativeleft lumbar facet block #10 Palliative right L3-4 LESI          Recent Visits Date Type Provider Dept  02/29/20 Telemedicine Milinda Pointer, MD [facility]  02/10/20 Procedure visit Milinda Pointer, [facility]  02/07/20 Telemedicine Milinda Pointer, MD [facility]  01/04/20 Procedure visit Milinda Pointer, MD [facility]  Showing recent visits within past 90 days and meeting all other requirements Today's Visits Date Type Provider Dept  03/07/20 Procedure visit Milinda Pointer, MD [facility]  Showing today's visits and meeting all other requirements Future Appointments Date Type Provider Dept  03/21/20 Appointment Milinda Pointer, MD [facility]  04/19/20 Appointment Milinda Pointer, MD [facility]  Showing future appointments within next 90 days and meeting all other requirements  Disposition: Discharge home  Discharge (Date  Time): 03/07/2020; 0900 hrs.   Primary Care Physician: Olin Hauser, DO Location: [facility] Note by: Gaspar Cola, MD Date: 03/07/2020; Time:  9:01 AM  Disclaimer:  Medicine is not an Chief Strategy Officer. The only guarantee in medicine is that nothing is guaranteed. It is important to note that the decision to proceed with this intervention was based on the information collected from the patient. The Data and conclusions were drawn from the patient's questionnaire, the interview, and the physical examination. Because the information was provided in large part by the patient, it cannot be guaranteed that it has not been purposely or unconsciously manipulated. Every effort has been made to obtain as much relevant data as possible for this evaluation. It is important to note that the conclusions that lead to this procedure are derived in large part from the available data. Always take into account that the treatment will also be dependent on availability of resources and existing treatment guidelines, considered by other Pain Management Practitioners as being common knowledge and practice, at the time of the intervention. For Medico-Legal purposes, it is also important to point out that variation in procedural techniques and pharmacological choices are the acceptable norm. The indications, contraindications, technique, and results of the above procedure should only be interpreted and judged by a Board-Certified Interventional Pain Specialist with extensive familiarity and expertise in the same exact procedure and technique.

## 2020-03-08 ENCOUNTER — Telehealth: Payer: Self-pay | Admitting: *Deleted

## 2020-03-08 NOTE — Telephone Encounter (Signed)
Spoke with daughter Arbie Cookey, no problems post procedure.

## 2020-03-21 ENCOUNTER — Ambulatory Visit: Payer: Medicare Other | Attending: Pain Medicine | Admitting: Pain Medicine

## 2020-03-21 ENCOUNTER — Encounter: Payer: Self-pay | Admitting: Pain Medicine

## 2020-03-21 ENCOUNTER — Other Ambulatory Visit: Payer: Self-pay

## 2020-03-21 VITALS — BP 168/73 | HR 71 | Temp 97.2°F | Resp 14 | Ht 65.0 in | Wt 126.0 lb

## 2020-03-21 DIAGNOSIS — S22080S Wedge compression fracture of T11-T12 vertebra, sequela: Secondary | ICD-10-CM | POA: Diagnosis not present

## 2020-03-21 DIAGNOSIS — M4317 Spondylolisthesis, lumbosacral region: Secondary | ICD-10-CM | POA: Diagnosis not present

## 2020-03-21 DIAGNOSIS — M48062 Spinal stenosis, lumbar region with neurogenic claudication: Secondary | ICD-10-CM | POA: Diagnosis not present

## 2020-03-21 DIAGNOSIS — G8929 Other chronic pain: Secondary | ICD-10-CM | POA: Diagnosis not present

## 2020-03-21 DIAGNOSIS — M62830 Muscle spasm of back: Secondary | ICD-10-CM | POA: Diagnosis not present

## 2020-03-21 DIAGNOSIS — M51379 Other intervertebral disc degeneration, lumbosacral region without mention of lumbar back pain or lower extremity pain: Secondary | ICD-10-CM

## 2020-03-21 DIAGNOSIS — M5137 Other intervertebral disc degeneration, lumbosacral region: Secondary | ICD-10-CM | POA: Diagnosis not present

## 2020-03-21 DIAGNOSIS — M545 Low back pain, unspecified: Secondary | ICD-10-CM

## 2020-03-21 MED ORDER — METHOCARBAMOL 500 MG PO TABS: 500.0000 mg | ORAL_TABLET | Freq: Every day | ORAL | 0 refills | Status: DC | PRN

## 2020-03-21 NOTE — Patient Instructions (Addendum)
____________________________________________________________________________________________  Muscle Spasms & Cramps  Cause:  The most common cause of muscle spasms and cramps is vitamin and/or electrolyte (calcium, potassium, sodium, etc.) deficiencies.  Possible triggers: Sweating - causes loss of electrolytes thru the skin. Steroids - causes loss of electrolytes thru the urine.  Treatment: 1. Gatorade (or any other electrolyte-replenishing drink) - Take 1, 8 oz glass with each meal (3 times a day). 2. OTC (over-the-counter) Magnesium 400 to 500 mg - Take 1 tablet twice a day (one with breakfast and one before bedtime). If you have kidney problems, talk to your primary care physician before taking any Magnesium. 3. Tonic Water with quinine - Take 1, 8 oz glass before bedtime.   ____________________________________________________________________________________________   ____________________________________________________________________________________________  Preparing for your procedure (without sedation)  Procedure appointments are limited to planned procedures: . No Prescription Refills. . No disability issues will be discussed. . No medication changes will be discussed.  Instructions: . Oral Intake: Do not eat or drink anything for at least 6 hours prior to your procedure. (Exception: Blood Pressure Medication. See below.) . Transportation: Unless otherwise stated by your physician, you may drive yourself after the procedure. . Blood Pressure Medicine: Do not forget to take your blood pressure medicine with a sip of water the morning of the procedure. If your Diastolic (lower reading)is above 100 mmHg, elective cases will be cancelled/rescheduled. . Blood thinners: These will need to be stopped for procedures. Notify our staff if you are taking any blood thinners. Depending on which one you take, there will be specific instructions on how and when to stop it. . Diabetics on  insulin: Notify the staff so that you can be scheduled 1st case in the morning. If your diabetes requires high dose insulin, take only  of your normal insulin dose the morning of the procedure and notify the staff that you have done so. . Preventing infections: Shower with an antibacterial soap the morning of your procedure.  . Build-up your immune system: Take 1000 mg of Vitamin C with every meal (3 times a day) the day prior to your procedure. Marland Kitchen Antibiotics: Inform the staff if you have a condition or reason that requires you to take antibiotics before dental procedures. . Pregnancy: If you are pregnant, call and cancel the procedure. . Sickness: If you have a cold, fever, or any active infections, call and cancel the procedure. . Arrival: You must be in the facility at least 30 minutes prior to your scheduled procedure. . Children: Do not bring any children with you. . Dress appropriately: Bring dark clothing that you would not mind if they get stained. . Valuables: Do not bring any jewelry or valuables.  Reasons to call and reschedule or cancel your procedure: (Following these recommendations will minimize the risk of a serious complication.) . Surgeries: Avoid having procedures within 2 weeks of any surgery. (Avoid for 2 weeks before or after any surgery). . Flu Shots: Avoid having procedures within 2 weeks of a flu shots or . (Avoid for 2 weeks before or after immunizations). . Barium: Avoid having a procedure within 7-10 days after having had a radiological study involving the use of radiological contrast. (Myelograms, Barium swallow or enema study). . Heart attacks: Avoid any elective procedures or surgeries for the initial 6 months after a "Myocardial Infarction" (Heart Attack). . Blood thinners: It is imperative that you stop these medications before procedures. Let us know if you if you take any blood thinner.  . Infection: Avoid procedures  during or within two weeks of an infection  (including chest colds or gastrointestinal problems). Symptoms associated with infections include: Localized redness, fever, chills, night sweats or profuse sweating, burning sensation when voiding, cough, congestion, stuffiness, runny nose, sore throat, diarrhea, nausea, vomiting, cold or Flu symptoms, recent or current infections. It is specially important if the infection is over the area that we intend to treat. Marland Kitchen Heart and lung problems: Symptoms that may suggest an active cardiopulmonary problem include: cough, chest pain, breathing difficulties or shortness of breath, dizziness, ankle swelling, uncontrolled high or unusually low blood pressure, and/or palpitations. If you are experiencing any of these symptoms, cancel your procedure and contact your primary care physician for an evaluation.  Remember:  Regular Business hours are:  Monday to Thursday 8:00 AM to 4:00 PM  Provider's Schedule: Milinda Pointer, MD:  Procedure days: Tuesday and Thursday 7:30 AM to 4:00 PM  Gillis Santa, MD:  Procedure days: Monday and Wednesday 7:30 AM to 4:00 PM ____________________________________________________________________________________________   Methocarbamol tablets What is this medicine? METHOCARBAMOL (meth oh KAR ba mole) helps to relieve pain and stiffness in muscles caused by strains, sprains, or other injury to your muscles. This medicine may be used for other purposes; ask your health care provider or pharmacist if you have questions. COMMON BRAND NAME(S): Robaxin What should I tell my health care provider before I take this medicine? They need to know if you have any of these conditions:  kidney disease  seizures  an unusual or allergic reaction to methocarbamol, other medicines, foods, dyes, or preservatives  pregnant or trying to get pregnant  breast-feeding How should I use this medicine? Take this medicine by mouth with a full glass of water. Follow the directions on the  prescription label. Take your medicine at regular intervals. Do not take your medicine more often than directed. Talk to your pediatrician regarding the use of this medicine in children. Special care may be needed. Overdosage: If you think you have taken too much of this medicine contact a poison control center or emergency room at once. NOTE: This medicine is only for you. Do not share this medicine with others. What if I miss a dose? If you miss a dose, take it as soon as you can. If it is almost time for your next dose, take only the next dose. Do not take double or extra doses. What may interact with this medicine? Do not take this medication with any of the following medicines:  narcotic medicines for cough This medicine may also interact with the following medications:  alcohol  antihistamines for allergy, cough and cold  certain medicines for anxiety or sleep  certain medicines for depression like amitriptyline, fluoxetine, sertraline  certain medicines for seizures like phenobarbital, primidone  cholinesterase inhibitors like neostigmine, ambenonium, and pyridostigmine bromide  general anesthetics like halothane, isoflurane, methoxyflurane, propofol  local anesthetics like lidocaine, pramoxine, tetracaine  medicines that relax muscles for surgery  narcotic medicines for pain  phenothiazines like chlorpromazine, mesoridazine, prochlorperazine, thioridazine This list may not describe all possible interactions. Give your health care provider a list of all the medicines, herbs, non-prescription drugs, or dietary supplements you use. Also tell them if you smoke, drink alcohol, or use illegal drugs. Some items may interact with your medicine. What should I watch for while using this medicine? Tell your doctor or health care professional if your symptoms do not start to get better or if they get worse. You may get drowsy or dizzy. Do  not drive, use machinery, or do anything that  needs mental alertness until you know how this medicine affects you. Do not stand or sit up quickly, especially if you are an older patient. This reduces the risk of dizzy or fainting spells. Alcohol may interfere with the effect of this medicine. Avoid alcoholic drinks. If you are taking another medicine that also causes drowsiness, you may have more side effects. Give your health care provider a list of all medicines you use. Your doctor will tell you how much medicine to take. Do not take more medicine than directed. Call emergency for help if you have problems breathing or unusual sleepiness. What side effects may I notice from receiving this medicine? Side effects that you should report to your doctor or health care professional as soon as possible:  allergic reactions like skin rash, itching or hives, swelling of the face, lips, or tongue  breathing problems  confusion  seizures  unusually weak or tired Side effects that usually do not require medical attention (report to your doctor or health care professional if they continue or are bothersome):  dizziness  headache  metallic taste  tiredness  upset stomach This list may not describe all possible side effects. Call your doctor for medical advice about side effects. You may report side effects to FDA at 1-800-FDA-1088. Where should I keep my medicine? Keep out of the reach of children. Store at room temperature between 20 and 25 degrees C (68 and 77 degrees F). Keep container tightly closed. Throw away any unused medicine after the expiration date. NOTE: This sheet is a summary. It may not cover all possible information. If you have questions about this medicine, talk to your doctor, pharmacist, or health care provider.  2020 [employer] (2015-04-25 13:11:54)  A prescription for Robaxin has been sent to your pharmacy.

## 2020-03-21 NOTE — Progress Notes (Signed)
Safety precautions to be maintained throughout the outpatient stay will include: orient to surroundings, keep bed in low position, maintain call bell within reach at all times, provide assistance with transfer out of bed and ambulation.  

## 2020-03-21 NOTE — Progress Notes (Signed)
PROVIDER NOTE: Information contained herein reflects review and annotations entered in association with encounter. Interpretation of such information and data should be left to medically-trained personnel. Information provided to patient can be located elsewhere in the medical record under "Patient Instructions". Document created using STT-dictation technology, any transcriptional errors that may result from process are unintentional.    Patient: Brittney Simpson  Service Category: E/M  Provider: Gaspar Cola, MD  DOB: 1928-01-15  DOS: 03/21/2020  Specialty: Interventional Pain Management  MRN: [id number]  Setting: Ambulatory outpatient  PCP: Olin Hauser, DO  Type: Established Patient    Referring Provider: Nobie Putnam *  Location: Office  Delivery: Face-to-face     HPI  Reason for encounter: [name], a 86 y.o. year old female, is here today for evaluation and management of her Chronic bilateral low back pain without sciatica [M54.5, G89.29]. [name] primary complain today is Back Pain (lower) Last encounter: Practice (03/08/2020). My last encounter with her was on 03/07/2020. Pertinent problems: [name] has Lumbar facet syndrome (Bilateral) (R>L); Chronic low back pain (Primary Area of Pain) (Bilateral) (R>L); Spondylolisthesis of lumbosacral region (L2-3 and L5-S1); Lumbar spinal stenosis (9 mm at L3-4); Trochanteric bursitis (Right); Chronic hip pain (Secondary area of Pain) (Right); Osteoarthritis of hip (Right); Chronic sacroiliac joint pain (Bilateral) (R>L); Chronic lumbar radicular pain (Right); Chronic lower extremity pain (Third area of Pain) (Right); Myofascial pain; Spondylosis without myelopathy or radiculopathy, lumbosacral region; Ankle edema, bilateral; Abnormal MRI, lumbar spine (01/10/2017); T12 compression fracture, sequela; Lumbar facet hypertrophy; Lumbar lateral recess stenosis; Lumbar foraminal stenosis; Non-traumatic compression  fracture of T7 thoracic vertebra, sequela; Chronic pain syndrome; DDD (degenerative disc disease), lumbosacral; Edema of lower extremity; Acute exacerbation of chronic low back pain; Acute mid back pain; Somatic dysfunction of sacroiliac joints (Bilateral); Other spondylosis, sacral and sacrococcygeal region; and Spasm of back muscles on their pertinent problem list. Pain Assessment: Severity of Chronic pain is reported as a 7 /10. Location: Back Lower/denies. Onset: More than a month ago. Quality: Constant. Timing: Constant. Modifying factor(s): nothing. Vitals:  height is _0  (1.651 m) and weight is 126 lb (57.2 kg). Her temporal temperature is 97.2 F (36.2 C) (abnormal). Her blood pressure is 168/73 (abnormal) and her pulse is 71. Her respiration is 14 and oxygen saturation is 98%.   According to the patient, she had 100% relief of the pain for the duration of the local anesthetic.  At this point, between the right L2-3 LESI and the prior right IA hip injection and bilateral SI joint injection, she indicates having absolutely no lower extremity pain.  However, she still coming in in a wheelchair indicating that even though her lower extremities do not hurt, if she attempts to stand up and put any weight on her spine, then the lower back pain becomes unbearable.  After the right sided L2-3 LESI she is having minimal low back pain on the right side and absolutely no right lower extremity pain.  In the case of the left side, some of the low back pain did improve but she still having some spasms.  She is also experiencing most of her pain in the left lower back.  In view of this, today we have decided to go ahead and schedule her for a left-sided T12-L1 LESI since her last T12 fracture did end up causing retropulsion of the T12 level with some spinal stenosis.  I suspect that some of the pain is coming from this area and  therefore we will be treating that next.  She refers that overall she has been improving,  but unfortunately she still having quite a bit of pain.  Today we had decided to do a trial off methocarbamol 500 mg p.o. daily to see if that would help her with some of the muscle spasms.  Post-Procedure Evaluation  Procedure (03/07/2020): Therapeutic right sided L2-3 LESI #1 under fluoroscopic guidance, no sedation. Pre-procedure pain level: 10/10 Post-procedure: 0/10 (100% relief)  Sedation: None.  Effectiveness during initial hour after procedure(Ultra-Short Term Relief): 100 %.  Local anesthetic used: Long-acting (4-6 hours) Effectiveness: Defined as any analgesic benefit obtained secondary to the administration of local anesthetics. This carries significant diagnostic value as to the etiological location, or anatomical origin, of the pain. Duration of benefit is expected to coincide with the duration of the local anesthetic used.  Effectiveness during initial 4-6 hours after procedure(Short-Term Relief): 100 %.  Long-term benefit: Defined as any relief past the pharmacologic duration of the local anesthetics.  Effectiveness past the initial 6 hours after procedure(Long-Term Relief): 25 %.  Current benefits: Defined as benefit that persist at this time.   Analgesia:  <50% better Function: Somewhat improved ROM: Somewhat improved  Pharmacotherapy Assessment   Analgesic: Tramadol 50 mg, 1 tab PO q 8 hrs (150 mg/day of tramadol) MME/day: 15 mg/day.   Monitoring: [place] PMP: PDMP reviewed during this encounter.       Pharmacotherapy: No side-effects or adverse reactions reported. Compliance: No problems identified. Effectiveness: Clinically acceptable.  [name], RN  03/21/2020  2:35 PM  Sign when Signing Visit Safety precautions to be maintained throughout the outpatient stay will include: orient to surroundings, keep bed in low position, maintain call bell within reach at all times, provide assistance with transfer out of bed and ambulation.     UDS:  Summary  Date Value  Ref Range Status  10/28/2017 FINAL  Final    Comment:    ==================================================================== TOXASSURE SELECT 13 (MW) ==================================================================== Test                             Result       Flag       Units Drug Present and Declared for Prescription Verification   Tramadol                       >20000       EXPECTED   ng/mg creat   O-Desmethyltramadol            >20000       EXPECTED   ng/mg creat   N-Desmethyltramadol            8244         EXPECTED   ng/mg creat    Source of tramadol is a prescription medication.    O-desmethyltramadol and N-desmethyltramadol are expected    metabolites of tramadol. Drug Present not Declared for Prescription Verification   7-aminoclonazepam              248          UNEXPECTED ng/mg creat    7-aminoclonazepam is an expected metabolite of clonazepam. Source    of clonazepam is a scheduled prescription medication. ==================================================================== Test                      Result    Flag   Units  Ref Range   Creatinine              25               mg/dL      >=20 ==================================================================== Declared Medications:  The flagging and interpretation on this report are based on the  following declared medications.  Unexpected results may arise from  inaccuracies in the declared medications.  **Note: The testing scope of this panel includes these medications:  Tramadol  **Note: The testing scope of this panel does not include following  reported medications:  Acetaminophen  Amlodipine  Brimonidine Tartrate  Cranberry Extract  Furosemide  Latanoprost  Levothyroxine  Lidocaine  Lisinopril  Metoprolol  Omeprazole  Timolol  Trazodone  Vitamin D2 ==================================================================== For clinical consultation, please call [phone]. ====================================================================      ROS  Constitutional: Denies any fever or chills Gastrointestinal: No reported hemesis, hematochezia, vomiting, or acute GI distress Musculoskeletal: Denies any acute onset joint swelling, redness, loss of ROM, or weakness Neurological: No reported episodes of acute onset apraxia, aphasia, dysarthria, agnosia, amnesia, paralysis, loss of coordination, or loss of consciousness  Medication Review  Cranberry, HYDROcodone-acetaminophen, Melatonin, Vitamin D2, acetaminophen, amLODipine, clonazePAM, furosemide, latanoprost, levothyroxine, lisinopril, methocarbamol, metoprolol succinate, omeprazole, simethicone, sulfamethoxazole-trimethoprim, traMADol, and traZODone  History Review  Allergy: [name] has No Known Allergies. Drug: [name]  reports no history of drug use. Alcohol:  reports no history of alcohol use. Tobacco:  reports that she has never smoked. She has never used smokeless tobacco. Social: [name]  reports that she has never smoked. She has never used smokeless tobacco. She reports that she does not drink alcohol and does not use drugs. Medical:  has a past medical history of Anxiety, Back ache, Hiatal hernia, Hyperlipidemia, Hypertension, Hypothyroid, Osteopenia, Thyroid disease, and UTI (urinary tract infection). Surgical: [name]  has a past surgical history that includes Hernia repair; Hernia repair; Hemorrhoid surgery; Kyphoplasty (N/A, 01/23/2017); and Kyphoplasty (N/A, 01/25/2020). Family: family history includes Cancer in her father; Stroke in her mother.  Laboratory Chemistry Profile   Renal Lab Results  Component Value Date   BUN 11 05/10/2019   CREATININE 0.97 (H) 05/10/2019   BCR 11 05/10/2019   GFRAA 60 05/10/2019   GFRNONAA 52 (L) 05/10/2019     Hepatic Lab Results  Component Value Date   AST 12 05/10/2019   ALT 9 05/10/2019   ALBUMIN 3.6 11/22/2016    ALKPHOS 59 11/22/2016   LIPASE 21 06/14/2016     Electrolytes Lab Results  Component Value Date   NA 137 05/10/2019   K 4.0 05/10/2019   CL 98 05/10/2019   CALCIUM 9.7 05/10/2019   MG 2.1 01/14/2014     Bone No results found for: VD25OH, VD125OH2TOT, RF7588TG5, QD8264BR8, 25OHVITD1, 25OHVITD2, 25OHVITD3, TESTOFREE, TESTOSTERONE   Inflammation (CRP: Acute Phase) (ESR: Chronic Phase) No results found for: CRP, ESRSEDRATE, LATICACIDVEN     Note: Above Lab results reviewed.  Recent Imaging Review  DG PAIN CLINIC C-ARM 1-60 MIN NO REPORT Fluoro was used, but no Radiologist interpretation will be provided.  Please refer to "NOTES" tab for provider progress note. Note: Reviewed        Physical Exam  General appearance: Well nourished, well developed, and well hydrated. In no apparent acute distress Mental status: Alert, oriented x 3 (person, place, & time)       Respiratory: No evidence of acute respiratory distress Eyes: PERLA Vitals: BP (!) 168/73   Pulse  71   Temp (!) 97.2 F (36.2 C) (Temporal)   Resp 14   Ht _0  (1.651 m)   Wt 126 lb (57.2 kg)   SpO2 98%   BMI 20.97 kg/m  BMI: Estimated body mass index is 20.97 kg/m as calculated from the following:   Height as of this encounter: _1  (1.651 m).   Weight as of this encounter: 126 lb (57.2 kg). Ideal: Ideal body weight: 57 kg (125 lb 10.6 oz) Adjusted ideal body weight: 57.1 kg (125 lb 12.8 oz)  Assessment   Status Diagnosis  Controlled Controlled Controlled 1. Chronic low back pain (Primary Area of Pain) (Bilateral) (R>L)   2. DDD (degenerative disc disease), lumbosacral   3. T12 compression fracture, sequela   4. Spondylolisthesis of lumbosacral region (L2-3 and L5-S1)   5. Spasm of back muscles   6. Spinal stenosis of lumbar region with neurogenic claudication      Updated Problems: Problem  Spasm of Back Muscles    Plan of Care  Problem-specific:  No problem-specific Assessment & Plan notes  found for this encounter.  [name] has a current medication list which includes the following long-term medication(s): amlodipine, clonazepam, furosemide, levothyroxine, lisinopril, metoprolol succinate, omeprazole, simethicone, tramadol, trazodone, and methocarbamol.  Pharmacotherapy (Medications Ordered): Meds ordered this encounter  Medications  . methocarbamol (ROBAXIN) 500 MG tablet    Sig: Take 1 tablet (500 mg total) by mouth daily as needed for muscle spasms.    Dispense:  30 tablet    Refill:  0    Do not place this medication, or any other prescription from our practice, on "Automatic Refill". Patient may have prescription filled one day early if pharmacy is closed on scheduled refill date.   Orders:  Orders Placed This Encounter  Procedures  . Lumbar Epidural Injection    Standing Status:   Future    Standing Expiration Date:   04/21/2020    Scheduling Instructions:     Procedure: Interlaminar Lumbar Epidural Steroid injection (LESI)  T12-L1     Laterality: Left-sided     Sedation: Patient's choice.     Timeframe: ASAA    Order Specific Question:   Where will this procedure be performed?    Answer:   [facility]   Follow-up plan:   Return for Procedure (no sedation): (L) T12-L1 LESI #1 and to evaluate Robaxin.      Interventional management options:  Considering: Possible bilateral lumbar facet RFA. (Offerred several times. Pt. chooses to hold.) Diagnostic right hip joint injection Diagnostic right L3 TFESI Diagnostic right trochanteric bursa injection Diagnostic right SI joint block Diagnostic midline caudal ESI (for the tailbone pain)   Palliative PRN treatment(s): Palliativeright lumbar facet block #12 Palliativeleft lumbar facet block #10 Palliative right L3-4 LESI           Recent Visits Date Type Provider Dept  03/07/20 Procedure visit Milinda Pointer, MD [facility]  02/29/20 Telemedicine Milinda Pointer, MD [facility]  02/10/20 Procedure visit Milinda Pointer, [facility]  02/07/20 Telemedicine Milinda Pointer, MD [facility]  01/04/20 Procedure visit Milinda Pointer, MD [facility]  Showing recent visits within past 90 days and meeting all other requirements Today's Visits Date Type Provider Dept  03/21/20 Office Visit Milinda Pointer, MD [facility]  Showing today's visits and meeting all other requirements Future Appointments Date Type Provider Dept  03/23/20 Appointment Milinda Pointer, [facility]  04/11/20  Appointment Milinda Pointer, MD [facility]  Showing future appointments within next 90 days and meeting all other requirements  I discussed the assessment and treatment plan with the patient. The patient was provided an opportunity to ask questions and all were answered. The patient agreed with the plan and demonstrated an understanding of the instructions.  Patient advised to call back or seek an in-person evaluation if the symptoms or condition worsens.  Duration of encounter: 30 minutes.  Note by: Gaspar Cola, MD Date: 03/21/2020; Time: 5:25 PM

## 2020-03-23 ENCOUNTER — Ambulatory Visit: Payer: Medicare Other | Admitting: Pain Medicine

## 2020-03-23 ENCOUNTER — Encounter: Payer: Self-pay | Admitting: Pain Medicine

## 2020-03-23 ENCOUNTER — Other Ambulatory Visit: Payer: Self-pay

## 2020-03-23 ENCOUNTER — Ambulatory Visit
Admission: RE | Admit: 2020-03-23 | Discharge: 2020-03-23 | Disposition: A | Discharge status: Home / Self Care | Payer: Medicare Other | Source: Ambulatory Visit | Attending: Pain Medicine | Admitting: Pain Medicine

## 2020-03-23 VITALS — BP 173/86 | HR 83 | Temp 97.4°F | Resp 17 | Ht 65.0 in | Wt 126.0 lb

## 2020-03-23 DIAGNOSIS — M5135 Other intervertebral disc degeneration, thoracolumbar region: Secondary | ICD-10-CM | POA: Diagnosis not present

## 2020-03-23 DIAGNOSIS — M5137 Other intervertebral disc degeneration, lumbosacral region: Secondary | ICD-10-CM

## 2020-03-23 DIAGNOSIS — G8929 Other chronic pain: Secondary | ICD-10-CM | POA: Diagnosis not present

## 2020-03-23 DIAGNOSIS — M545 Low back pain: Secondary | ICD-10-CM | POA: Insufficient documentation

## 2020-03-23 DIAGNOSIS — S22080S Wedge compression fracture of T11-T12 vertebra, sequela: Secondary | ICD-10-CM | POA: Diagnosis not present

## 2020-03-23 MED ORDER — SODIUM CHLORIDE (PF) 0.9 % IJ SOLN
INTRAMUSCULAR | Status: AC
  Filled 2020-03-23: qty 10

## 2020-03-23 MED ORDER — ROPIVACAINE HCL 2 MG/ML IJ SOLN
INTRAMUSCULAR | Status: AC
  Filled 2020-03-23: qty 10

## 2020-03-23 MED ORDER — IOHEXOL 180 MG/ML  SOLN
10.0000 mL | Freq: Once | INTRAMUSCULAR | Status: AC
  Administered 2020-03-23: 10 mL via EPIDURAL

## 2020-03-23 MED ORDER — ROPIVACAINE HCL 2 MG/ML IJ SOLN
2.0000 mL | Freq: Once | INTRAMUSCULAR | Status: AC
  Administered 2020-03-23: 2 mL via EPIDURAL

## 2020-03-23 MED ORDER — TRIAMCINOLONE ACETONIDE 40 MG/ML IJ SUSP
40.0000 mg | Freq: Once | INTRAMUSCULAR | Status: AC
  Administered 2020-03-23: 40 mg

## 2020-03-23 MED ORDER — LIDOCAINE HCL 2 % IJ SOLN
INTRAMUSCULAR | Status: AC
  Filled 2020-03-23: qty 20

## 2020-03-23 MED ORDER — SODIUM CHLORIDE 0.9% FLUSH
2.0000 mL | Freq: Once | INTRAVENOUS | Status: AC
  Administered 2020-03-23: 2 mL

## 2020-03-23 MED ORDER — LIDOCAINE HCL 2 % IJ SOLN
20.0000 mL | Freq: Once | INTRAMUSCULAR | Status: AC
  Administered 2020-03-23: 400 mg

## 2020-03-23 MED ORDER — TRIAMCINOLONE ACETONIDE 40 MG/ML IJ SUSP
INTRAMUSCULAR | Status: AC
  Filled 2020-03-23: qty 1

## 2020-03-23 NOTE — Progress Notes (Signed)
Safety precautions to be maintained throughout the outpatient stay will include: orient to surroundings, keep bed in low position, maintain call bell within reach at all times, provide assistance with transfer out of bed and ambulation.  

## 2020-03-23 NOTE — Progress Notes (Signed)
PROVIDER NOTE: Information contained herein reflects review and annotations entered in association with encounter. Interpretation of such information and data should be left to medically-trained personnel. Information provided to patient can be located elsewhere in the medical record under "Patient Instructions". Document created using STT-dictation technology, any transcriptional errors that may result from process are unintentional.    Patient: Brittney Simpson  Service Category: Procedure  Provider: Gaspar Cola, MD  DOB: 28-Jan-1928  DOS: 03/23/2020  Location: [facility]  MRN: [id number]  Setting: Ambulatory - outpatient  Referring Provider: Nobie Putnam *  Type: Established Patient  Specialty: Interventional Pain Management  PCP: Olin Hauser, DO   Primary Reason for Visit: Interventional Pain Management Treatment. CC: Back Pain (lower)  Procedure:          Anesthesia, Analgesia, Anxiolysis:  Type: Therapeutic Inter-Laminar Epidural Steroid Injection           Region: Lumbar Level:  T12-L1  Level. Laterality: Left Paramedial  Type: Local Anesthesia Indication(s): Analgesia         Route: Infiltration ([place]/IM) IV Access: Declined Sedation: Declined  Local Anesthetic: Lidocaine 1-2%  Position: Prone with head of the table was raised to facilitate breathing.   Indications: 1. Chronic low back pain (Primary Area of Pain) (Bilateral) (R>L)   2. T12 compression fracture, sequela   3. DDD (degenerative disc disease), lumbosacral   4. DDD (degenerative disc disease), thoracolumbar    Pain Score: Pre-procedure: 7 /10 Post-procedure: 4 /10   Pre-op Assessment:  Brittney Simpson is a 86 y.o. (year old), female patient, seen today for interventional treatment. She  has a past surgical history that includes Hernia repair; Hernia repair; Hemorrhoid surgery; Kyphoplasty (N/A, 01/23/2017); and Kyphoplasty (N/A, 01/25/2020). Brittney Simpson has a current  medication list which includes the following prescription(s): acetaminophen, amlodipine, clonazepam, cranberry, vitamin d2, furosemide, hydrocodone-acetaminophen, latanoprost, levothyroxine, lisinopril, melatonin, methocarbamol, metoprolol succinate, omeprazole, simethicone, sulfamethoxazole-trimethoprim, tramadol, and trazodone. Her primarily concern today is the Back Pain (lower)  Initial Vital Signs:  Pulse/HCG Rate: 72  Temp: (!) 97.4 F (36.3 C) Resp: 16 BP: (!) 158/79 SpO2: 98 %  BMI: Estimated body mass index is 20.97 kg/m as calculated from the following:   Height as of this encounter: 5\' 5"  (1.651 m).   Weight as of this encounter: 126 lb (57.2 kg).  Risk Assessment: Allergies: Reviewed. She has No Known Allergies.  Allergy Precautions: None required Coagulopathies: Reviewed. None identified.  Blood-thinner therapy: None at this time Active Infection(s): Reviewed. None identified. Brittney Simpson is afebrile  Site Confirmation: Brittney Simpson was asked to confirm the procedure and laterality before marking the site Procedure checklist: Completed Consent: Before the procedure and under the influence of no sedative(s), amnesic(s), or anxiolytics, the patient was informed of the treatment options, risks and possible complications. To fulfill our ethical and legal obligations, as recommended by the American Medical Association's Code of Ethics, I have informed the patient of my clinical impression; the nature and purpose of the treatment or procedure; the risks, benefits, and possible complications of the intervention; the alternatives, including doing nothing; the risk(s) and benefit(s) of the alternative treatment(s) or procedure(s); and the risk(s) and benefit(s) of doing nothing. The patient was provided information about the general risks and possible complications associated with the procedure. These may include, but are not limited to: failure to achieve desired goals, infection,  bleeding, organ or nerve damage, allergic reactions, paralysis, and death. In addition, the patient was informed of those risks and  complications associated to Spine-related procedures, such as failure to decrease pain; infection (i.e.: Meningitis, epidural or intraspinal abscess); bleeding (i.e.: epidural hematoma, subarachnoid hemorrhage, or any other type of intraspinal or peri-dural bleeding); organ or nerve damage (i.e.: Any type of peripheral nerve, nerve root, or spinal cord injury) with subsequent damage to sensory, motor, and/or autonomic systems, resulting in permanent pain, numbness, and/or weakness of one or several areas of the body; allergic reactions; (i.e.: anaphylactic reaction); and/or death. Furthermore, the patient was informed of those risks and complications associated with the medications. These include, but are not limited to: allergic reactions (i.e.: anaphylactic or anaphylactoid reaction(s)); adrenal axis suppression; blood sugar elevation that in diabetics may result in ketoacidosis or comma; water retention that in patients with history of congestive heart failure may result in shortness of breath, pulmonary edema, and decompensation with resultant heart failure; weight gain; swelling or edema; medication-induced neural toxicity; particulate matter embolism and blood vessel occlusion with resultant organ, and/or nervous system infarction; and/or aseptic necrosis of one or more joints. Finally, the patient was informed that Medicine is not an exact science; therefore, there is also the possibility of unforeseen or unpredictable risks and/or possible complications that may result in a catastrophic outcome. The patient indicated having understood very clearly. We have given the patient no guarantees and we have made no promises. Enough time was given to the patient to ask questions, all of which were answered to the patient's satisfaction. Brittney Simpson has indicated that she wanted to  continue with the procedure. Attestation: I, the ordering provider, attest that I have discussed with the patient the benefits, risks, side-effects, alternatives, likelihood of achieving goals, and potential problems during recovery for the procedure that I have provided informed consent. Date  Time: 03/23/2020 10:23 AM  Pre-Procedure Preparation:  Monitoring: As per clinic protocol. Respiration, ETCO2, SpO2, BP, heart rate and rhythm monitor placed and checked for adequate function Safety Precautions: Patient was assessed for positional comfort and pressure points before starting the procedure. Time-out: I initiated and conducted the "Time-out" before starting the procedure, as per protocol. The patient was asked to participate by confirming the accuracy of the "Time Out" information. Verification of the correct person, site, and procedure were performed and confirmed by me, the nursing staff, and the patient. "Time-out" conducted as per Joint Commission's Universal Protocol (UP.01.01.01). Time: 1044  Description of Procedure:          Target Area: The interlaminar space, initially targeting the lower laminar border of the superior vertebral body. Approach: Paramedial approach. Area Prepped: Entire Posterior Lumbar Region DuraPrep (Iodine Povacrylex [0.7% available iodine] and Isopropyl Alcohol, 74% w/w) Safety Precautions: Aspiration looking for blood return was conducted prior to all injections. At no point did we inject any substances, as a needle was being advanced. No attempts were made at seeking any paresthesias. Safe injection practices and needle disposal techniques used. Medications properly checked for expiration dates. SDV (single dose vial) medications used. Description of the Procedure: Protocol guidelines were followed. The procedure needle was introduced through the skin, ipsilateral to the reported pain, and advanced to the target area. Bone was contacted and the needle walked  caudad, until the lamina was cleared. The epidural space was identified using "loss-of-resistance technique" with 2-3 ml of PF-NaCl (0.9% NSS), in a 5cc LOR glass syringe.  Vitals:   03/23/20 1035 03/23/20 1040 03/23/20 1045 03/23/20 1050  BP: (!) 162/83 (!) 153/86 (!) 166/93 (!) 173/86  Pulse: 84 78 77 83  Resp:  17 13 16 17   Temp:      TempSrc:      SpO2: 100% 99% 98% 99%  Weight:      Height:        Start Time: 1044 hrs. End Time: 1150 hrs.  Materials:  Needle(s) Type: Epidural needle Gauge: 17G Length: 3.5-in Medication(s): Please see orders for medications and dosing details.  Imaging Guidance (Spinal):          Type of Imaging Technique: Fluoroscopy Guidance (Spinal) Indication(s): Assistance in needle guidance and placement for procedures requiring needle placement in or near specific anatomical locations not easily accessible without such assistance. Exposure Time: Please see nurses notes. Contrast: Before injecting any contrast, we confirmed that the patient did not have an allergy to iodine, shellfish, or radiological contrast. Once satisfactory needle placement was completed at the desired level, radiological contrast was injected. Contrast injected under live fluoroscopy. No contrast complications. See chart for type and volume of contrast used. Fluoroscopic Guidance: I was personally present during the use of fluoroscopy. "Tunnel Vision Technique" used to obtain the best possible view of the target area. Parallax error corrected before commencing the procedure. "Direction-depth-direction" technique used to introduce the needle under continuous pulsed fluoroscopy. Once target was reached, antero-posterior, oblique, and lateral fluoroscopic projection used confirm needle placement in all planes. Images permanently stored in EMR. Interpretation: I personally interpreted the imaging intraoperatively. Adequate needle placement confirmed in multiple planes. Appropriate spread of  contrast into desired area was observed. No evidence of afferent or efferent intravascular uptake. No intrathecal or subarachnoid spread observed. Permanent images saved into the patient's record.  Antibiotic Prophylaxis:   Anti-infectives (From admission, onward)   None     Indication(s): None identified  Post-operative Assessment:  Post-procedure Vital Signs:  Pulse/HCG Rate: 83  Temp: (!) 97.4 F (36.3 C) Resp: 17 BP: (!) 173/86 SpO2: 99 %  EBL: None  Complications: No immediate post-treatment complications observed by team, or reported by patient.  Note: The patient tolerated the entire procedure well. A repeat set of vitals were taken after the procedure and the patient was kept under observation following institutional policy, for this type of procedure. Post-procedural neurological assessment was performed, showing return to baseline, prior to discharge. The patient was provided with post-procedure discharge instructions, including a section on how to identify potential problems. Should any problems arise concerning this procedure, the patient was given instructions to immediately contact us, at any time, without hesitation. In any case, we plan to contact the patient by telephone for a follow-up status report regarding this interventional procedure.  Comments:  No additional relevant information.  Plan of Care  Orders:  Orders Placed This Encounter  Procedures  . Lumbar Epidural Injection    Scheduling Instructions:     Procedure: Interlaminar LESI T12-L1     Laterality: Left-sided     Sedation: No Sedation     Timeframe:  Today    Order Specific Question:   Where will this procedure be performed?    Answer:   [facility]  . [facility] C-ARM 1-60 MIN NO REPORT    Intraoperative interpretation by procedural physician at [facility].    Standing Status:   Standing    Number of Occurrences:   1    Order Specific Question:   Reason for exam:     Answer:   Assistance in needle guidance and placement for procedures requiring needle placement in or near specific anatomical locations not easily accessible  without such assistance.  . Informed Consent Details: Physician/Practitioner Attestation; Transcribe to consent form and obtain patient signature    Provider Attestation: I, [place] Dossie Arbour, MD, (Pain Management Specialist), the physician/practitioner, attest that I have discussed with the patient the benefits, risks, side effects, alternatives, likelihood of achieving goals and potential problems during recovery for the procedure that I have provided informed consent.    Scheduling Instructions:     Procedure: Lumbar epidural steroid injection under fluoroscopic guidance     Indications: Low back and/or lower extremity pain secondary to lumbar radiculitis     Note: Always confirm laterality of pain with Ms. Lepage, before procedure.     Transcribe to consent form and obtain patient signature.  . Provide equipment / supplies at bedside    Equipment required: Single use, disposable, "Epidural Tray" Epidural Catheter: NOT required    Standing Status:   Standing    Number of Occurrences:   1    Order Specific Question:   Specify    Answer:   Epidural Tray   Chronic Opioid Analgesic:  Tramadol 50 mg, 1 tab PO q 8 hrs (150 mg/day of tramadol) MME/day: 15 mg/day.   Medications ordered for procedure: Meds ordered this encounter  Medications  . iohexol (OMNIPAQUE) 180 MG/ML injection 10 mL    Must be Myelogram-compatible. If not available, you may substitute with a water-soluble, non-ionic, hypoallergenic, myelogram-compatible radiological contrast medium.  Marland Kitchen lidocaine (XYLOCAINE) 2 % (with pres) injection 400 mg  . sodium chloride flush (NS) 0.9 % injection 2 mL  . ropivacaine (PF) 2 mg/mL (0.2%) (NAROPIN) injection 2 mL  . triamcinolone acetonide (KENALOG-40) injection 40 mg   Medications administered: We administered iohexol,  lidocaine, sodium chloride flush, ropivacaine (PF) 2 mg/mL (0.2%), and triamcinolone acetonide.  See the medical record for exact dosing, route, and time of administration.  Follow-up plan:   Return in about 2 weeks (around 04/06/2020) for (VV), (PP) Follow-up, PM on Proc-day.       Interventional management options:  Considering: Possible bilateral lumbar facet RFA. (Offerred several times. Pt. chooses to hold.) Diagnostic right hip joint injection Diagnostic right L3 TFESI Diagnostic right trochanteric bursa injection Diagnostic right SI joint block Diagnostic midline caudal ESI (for the tailbone pain)   Palliative PRN treatment(s): Palliativeright lumbar facet block #12 Palliativeleft lumbar facet block #10 Palliative right L3-4 LESI            Recent Visits Date Type Provider Dept  03/21/20 Office Visit Milinda Pointer, MD [facility]  03/07/20 Procedure visit Milinda Pointer, MD [facility]  02/29/20 Telemedicine Milinda Pointer, MD [facility]  02/10/20 Procedure visit Milinda Pointer, MD [facility]  02/07/20 Telemedicine Milinda Pointer, MD [facility]  01/04/20 Procedure visit Milinda Pointer, MD [facility]  Showing recent visits within past 90 days and meeting all other requirements Today's Visits Date Type Provider Dept  03/23/20 Procedure visit Milinda Pointer, MD [facility]  Showing today's visits and meeting all other requirements Future Appointments Date Type Provider Dept  04/11/20 Appointment Milinda Pointer, MD [facility]  Showing future appointments within next 90 days and meeting all other requirements  Disposition: Discharge home  Discharge (Date  Time): 03/23/2020; 1058 hrs.   Primary Care Physician: Olin Hauser, DO Location: [facility] Note by: Gaspar Cola, MD Date: 03/23/2020; Time:  12:06 PM  Disclaimer:  Medicine is not an Chief Strategy Officer. The only guarantee in medicine  is that nothing is guaranteed. It is important to note that the decision to proceed with this intervention was based on the information collected from the patient. The Data and conclusions were drawn from the patient's questionnaire, the interview, and the physical examination. Because the information was provided in large part by the patient, it cannot be guaranteed that it has not been purposely or unconsciously manipulated. Every effort has been made to obtain as much relevant data as possible for this evaluation. It is important to note that the conclusions that lead to this procedure are derived in large part from the available data. Always take into account that the treatment will also be dependent on availability of resources and existing treatment guidelines, considered by other Pain Management Practitioners as being common knowledge and practice, at the time of the intervention. For Medico-Legal purposes, it is also important to point out that variation in procedural techniques and pharmacological choices are the acceptable norm. The indications, contraindications, technique, and results of the above procedure should only be interpreted and judged by a Board-Certified Interventional Pain Specialist with extensive familiarity and expertise in the same exact procedure and technique.

## 2020-03-23 NOTE — Patient Instructions (Signed)

## 2020-03-24 ENCOUNTER — Telehealth: Payer: Self-pay

## 2020-03-24 NOTE — Telephone Encounter (Signed)
Post procedure phone call.  Patient daughter states she will talk with her and call us back if needed.

## 2020-04-10 ENCOUNTER — Encounter: Payer: Self-pay | Admitting: Pain Medicine

## 2020-04-11 ENCOUNTER — Other Ambulatory Visit: Payer: Self-pay

## 2020-04-11 ENCOUNTER — Ambulatory Visit: Payer: Medicare Other | Attending: Pain Medicine | Admitting: Pain Medicine

## 2020-04-11 DIAGNOSIS — G894 Chronic pain syndrome: Secondary | ICD-10-CM | POA: Diagnosis not present

## 2020-04-11 DIAGNOSIS — M545 Low back pain: Secondary | ICD-10-CM

## 2020-04-11 DIAGNOSIS — S22080S Wedge compression fracture of T11-T12 vertebra, sequela: Secondary | ICD-10-CM | POA: Diagnosis not present

## 2020-04-11 DIAGNOSIS — M5135 Other intervertebral disc degeneration, thoracolumbar region: Secondary | ICD-10-CM

## 2020-04-11 DIAGNOSIS — M4317 Spondylolisthesis, lumbosacral region: Secondary | ICD-10-CM | POA: Diagnosis not present

## 2020-04-11 DIAGNOSIS — M5137 Other intervertebral disc degeneration, lumbosacral region: Secondary | ICD-10-CM | POA: Diagnosis not present

## 2020-04-11 DIAGNOSIS — G8929 Other chronic pain: Secondary | ICD-10-CM

## 2020-04-11 DIAGNOSIS — M62830 Muscle spasm of back: Secondary | ICD-10-CM | POA: Diagnosis not present

## 2020-04-11 MED ORDER — METHOCARBAMOL 500 MG PO TABS: 500.0000 mg | ORAL_TABLET | Freq: Every day | ORAL | 0 refills | Status: DC | PRN

## 2020-04-11 MED ORDER — TRAMADOL HCL 50 MG PO TABS: 50.0000 mg | ORAL_TABLET | Freq: Three times a day (TID) | ORAL | 0 refills | Status: DC
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
## 2020-04-14 ENCOUNTER — Other Ambulatory Visit: Payer: Self-pay | Admitting: Family Medicine

## 2020-04-14 DIAGNOSIS — I129 Hypertensive chronic kidney disease with stage 1 through stage 4 chronic kidney disease, or unspecified chronic kidney disease: Secondary | ICD-10-CM

## 2020-04-14 DIAGNOSIS — F419 Anxiety disorder, unspecified: Secondary | ICD-10-CM
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
## 2020-04-17 DIAGNOSIS — H401133 Primary open-angle glaucoma, bilateral, severe stage: Secondary | ICD-10-CM | POA: Diagnosis not present

## 2020-04-19 ENCOUNTER — Telehealth: Payer: Medicare Other | Admitting: Pain Medicine

## 2020-04-21 ENCOUNTER — Other Ambulatory Visit: Payer: Self-pay | Admitting: Family Medicine

## 2020-04-21 DIAGNOSIS — I129 Hypertensive chronic kidney disease with stage 1 through stage 4 chronic kidney disease, or unspecified chronic kidney disease: Secondary | ICD-10-CM
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
## 2020-04-26 DIAGNOSIS — R600 Localized edema: Secondary | ICD-10-CM | POA: Diagnosis not present

## 2020-04-26 DIAGNOSIS — N390 Urinary tract infection, site not specified: Secondary | ICD-10-CM | POA: Diagnosis not present

## 2020-04-26 DIAGNOSIS — I10 Essential (primary) hypertension: Secondary | ICD-10-CM | POA: Diagnosis not present

## 2020-04-26 DIAGNOSIS — N183 Chronic kidney disease, stage 3 unspecified: Secondary | ICD-10-CM | POA: Diagnosis not present

## 2020-05-03 ENCOUNTER — Other Ambulatory Visit: Payer: Self-pay

## 2020-05-03 ENCOUNTER — Ambulatory Visit (INDEPENDENT_AMBULATORY_CARE_PROVIDER_SITE_OTHER): Payer: Medicare Other | Admitting: Family Medicine

## 2020-05-03 ENCOUNTER — Encounter: Payer: Self-pay | Admitting: Family Medicine

## 2020-05-03 VITALS — BP 158/71 | HR 63 | Temp 97.5°F | Resp 16 | Ht 65.0 in | Wt 120.0 lb

## 2020-05-03 DIAGNOSIS — E034 Atrophy of thyroid (acquired): Secondary | ICD-10-CM

## 2020-05-03 DIAGNOSIS — R7309 Other abnormal glucose: Secondary | ICD-10-CM | POA: Diagnosis not present

## 2020-05-03 DIAGNOSIS — M62830 Muscle spasm of back: Secondary | ICD-10-CM

## 2020-05-03 DIAGNOSIS — F5101 Primary insomnia: Secondary | ICD-10-CM

## 2020-05-03 DIAGNOSIS — I129 Hypertensive chronic kidney disease with stage 1 through stage 4 chronic kidney disease, or unspecified chronic kidney disease: Secondary | ICD-10-CM | POA: Diagnosis not present

## 2020-05-03 DIAGNOSIS — F419 Anxiety disorder, unspecified: Secondary | ICD-10-CM | POA: Diagnosis not present

## 2020-05-03 DIAGNOSIS — N183 Chronic kidney disease, stage 3 unspecified: Secondary | ICD-10-CM

## 2020-05-03 DIAGNOSIS — K219 Gastro-esophageal reflux disease without esophagitis: Secondary | ICD-10-CM

## 2020-05-03 DIAGNOSIS — E782 Mixed hyperlipidemia: Secondary | ICD-10-CM | POA: Diagnosis not present

## 2020-05-03 DIAGNOSIS — Z23 Encounter for immunization: Secondary | ICD-10-CM

## 2020-05-03 MED ORDER — OMEPRAZOLE 20 MG PO CPDR: 20.0000 mg | DELAYED_RELEASE_CAPSULE | Freq: Every day | ORAL | 1 refills | Status: DC

## 2020-05-03 MED ORDER — LISINOPRIL 40 MG PO TABS: 40.0000 mg | ORAL_TABLET | Freq: Every day | ORAL | 3 refills | Status: DC

## 2020-05-03 MED ORDER — FUROSEMIDE 20 MG PO TABS: 30.0000 mg | ORAL_TABLET | Freq: Every day | ORAL | 5 refills | Status: DC

## 2020-05-03 MED ORDER — AMLODIPINE BESYLATE 5 MG PO TABS: 5.0000 mg | ORAL_TABLET | Freq: Every day | ORAL | 3 refills | Status: DC

## 2020-05-03 MED ORDER — METHOCARBAMOL 500 MG PO TABS: 500.0000 mg | ORAL_TABLET | Freq: Every day | ORAL | 5 refills | Status: DC | PRN

## 2020-05-03 MED ORDER — TRAZODONE HCL 50 MG PO TABS: 50.0000 mg | ORAL_TABLET | Freq: Every day | ORAL | 3 refills | Status: DC

## 2020-05-03 NOTE — Progress Notes (Signed)
Subjective:    Patient ID: Brittney Simpson, female    DOB: July 08, 1928, 86 y.o.   MRN: [id number]  Brittney Simpson is a 86 y.o. female presenting on 05/03/2020 for Hypertension  Family is present today for additional history.  HPI   Specialist: Nephrology - Dr Lavonia Dana [facility]) Pain Management - Dr Milinda Pointer [facility]) Vascular - Dr Leotis Pain ([facility])  GERD / Gas Production Bloating Chronic problem. Controlled on Omeprazole 20mg  daily.  CHRONIC HTN/ CKD-III Followed by Dr Kathryne Eriksson Nephrology q 6 months She is doing well lately Home readings recently fairly improved but has some chronic hard to control HTN Current Meds - Amlodipine 5mg  daily, Lisinopril 40mg  daily, Metoprolol XL 100mg  daily, Lasix30mg  daily(taking regularly due to edema- daily) Reports good compliance, took meds today. Tolerating well, w/o complaints. Admits edema ankles improved on lasixhigher dose Denies CP, dyspnea, HA, dizziness / lightheadedness  Hypothyroidism Chronic problem. Taking Levothyroxine 116mcg daily currently. Lastthyroid result was on 04/2019,was normal TSH and Free T4 normal Need lab for Thyroid  Chronic Low Back Pain, Lumbar DJD Spinal Stenosis / Facet DJD Followed by [facility] and OrthoSee prior background information Switch Methocarbamol 500mg  daily PRN from Pain management rx, now needs to be ordered by PCP will add refills today, takes nightly most often for muscle spasms  ANXIETY / INSOMNIA Previous visit continued Trazodone 50mg  nightly for sleep and chronic Clonazepam, see prior notes for background information. - Today patient reportsno new concerns. She is doing well. Anxiety is controlled. Sleeping well on medicine. -Denies any problems with withdrawal from BDZ, aware of risks of sedation and potential fall risk at age  Hyperlipidemia Elevated LDL cholesterol Prior concern for  statins and side effects, declines to take.  Recurrent UTI On Bactrim antibiotic daily for prophylaxis UTI by Nephrology Taking Cipro PRN UTI flare up as advised, has rx now for refills on file. Occasionally takes but not often. Doing well with this plan.  Health Maintenance: Did not get COVID vaccine  Due for Flu Shot, will receive today    Depression screen [facility] 2/9 05/03/2020 02/10/2020 01/04/2020  Decreased Interest 0 0 0  Down, Depressed, Hopeless 0 0 0  PHQ - 2 Score 0 0 0  Altered sleeping - - -  Tired, decreased energy - - -  Change in appetite - - -  Feeling bad or failure about yourself  - - -  Trouble concentrating - - -  Moving slowly or fidgety/restless - - -  Suicidal thoughts - - -  PHQ-9 Score - - -  Difficult doing work/chores - - -  Some recent data might be hidden    Social History   Tobacco Use  . Smoking status: Never Smoker  . Smokeless tobacco: Never Used  Substance Use Topics  . Alcohol use: No    Alcohol/week: 0.0 standard drinks  . Drug use: No    Review of Systems Per HPI unless specifically indicated above     Objective:    BP (!) 158/71   Pulse 63   Temp (!) 97.5 F (36.4 C) (Temporal)   Resp 16   Ht 5\' 5"  (1.651 m)   Wt 120 lb (54.4 kg)   SpO2 98%   BMI 19.97 kg/m   Wt Readings from Last 3 Encounters:  05/03/20 120 lb (54.4 kg)  03/23/20 126 lb (57.2 kg)  03/21/20 126 lb (57.2 kg)    Physical Exam  Vitals and nursing note reviewed.  Constitutional:      General: She is not in acute distress.    Appearance: She is well-developed. She is not diaphoretic.     Comments: Well-appearing thin elderly 86 year old female, comfortable, cooperative  HENT:     Head: Normocephalic and atraumatic.     Comments: Hard of hearing but able to follow conversation Eyes:     General:        Right eye: No discharge.        Left eye: No discharge.     Conjunctiva/sclera: Conjunctivae normal.  Neck:     Thyroid: No thyromegaly.   Cardiovascular:     Rate and Rhythm: Normal rate and regular rhythm.     Heart sounds: Normal heart sounds. No murmur heard.   Pulmonary:     Effort: Pulmonary effort is normal. No respiratory distress.     Breath sounds: Normal breath sounds. No wheezing or rales.  Abdominal:     General: Bowel sounds are normal. There is no distension.     Palpations: Abdomen is soft.     Tenderness: There is no abdominal tenderness.  Musculoskeletal:        General: Normal range of motion.     Cervical back: Normal range of motion and neck supple.     Right lower leg: Edema (trace, non pitting) present.     Left lower leg: Edema (trace non pitting) present.  Lymphadenopathy:     Cervical: No cervical adenopathy.  Skin:    General: Skin is warm and dry.     Findings: No erythema or rash.  Neurological:     Mental Status: She is alert and oriented to person, place, and time.  Psychiatric:        Behavior: Behavior normal.     Comments: Well groomed, good eye contact, normal speech and thoughts       Results for orders placed or performed during the hospital encounter of 01/21/20  SARS CORONAVIRUS 2 (TAT 6-24 HRS) Nasopharyngeal Nasopharyngeal Swab   Specimen: Nasopharyngeal Swab  Result Value Ref Range   SARS Coronavirus 2 NEGATIVE NEGATIVE      Assessment & Plan:   Problem List Items Addressed This Visit    Spasm of back muscles (Chronic)    Chronic problem Followed by [facility] Management Will take over the Methocarbamol as requested by them, send new rx and changed provider      Relevant Medications   methocarbamol (ROBAXIN) 500 MG tablet   Insomnia    Stable, chronic underlying problem likely related to variety factors age altered sleep cycle, anxiety, chronic pain  Plan: 1. Continue Trazodone continue 50mg  whole tab nightly Continue BDZ Clonazepam      Relevant Medications   traZODone (DESYREL) 50 MG tablet   Hypothyroidism    Previously controlled Return 1-2 week  for thyroid labs Keep current levothyroxine 195mcg daily for now      Hyperlipidemia    Prior elevated lipids Check fasting lab 1-2 week Declines statin       Relevant Medications   amLODipine (NORVASC) 5 MG tablet   furosemide (LASIX) 20 MG tablet   lisinopril (ZESTRIL) 40 MG tablet   Chronic anxiety    Stable, chronic anxiety, with associated insomnia Chronically on BDZ without history of abuse or dose increase Checked [place] CSRS without red flag  Plan: 1. Reviewed chronic BDZ use for anxiety, high risk sedation, fall, confusion, withdrawal risks - med tolerated very  well, aware not to increase dose - Continue Clonazepam 0.5mg  HALF tab BID - await refill request      Relevant Medications   traZODone (DESYREL) 50 MG tablet   Benign hypertension with CKD (chronic kidney disease) stage III (HCC) - Primary    Controlled BP Complicated by CKD-III  Plan: 1. Continue current regimen - Amlodipine 5mg  daily, Lisinopril 40mg  daily, Metoprolol XL 100mg  daily - May continue Lasix 30mg  daily now per Nephrology - refilled 2. Encourage to stay active, low sodium diet, improve hydration with water      Relevant Medications   amLODipine (NORVASC) 5 MG tablet   furosemide (LASIX) 20 MG tablet   lisinopril (ZESTRIL) 40 MG tablet   Abnormal glucose    Other Visit Diagnoses    Needs flu shot       Relevant Orders   Flu Vaccine QUAD High Dose(Fluad) (Completed)   Gastroesophageal reflux disease       Relevant Medications   omeprazole (PRILOSEC) 20 MG capsule      Meds ordered this encounter  Medications  . amLODipine (NORVASC) 5 MG tablet    Sig: Take 1 tablet (5 mg total) by mouth daily.    Dispense:  90 tablet    Refill:  3  . furosemide (LASIX) 20 MG tablet    Sig: Take 1.5 tablets (30 mg total) by mouth daily.    Dispense:  45 tablet    Refill:  5  . lisinopril (ZESTRIL) 40 MG tablet    Sig: Take 1 tablet (40 mg total) by mouth daily.    Dispense:  90 tablet    Refill:  3   . methocarbamol (ROBAXIN) 500 MG tablet    Sig: Take 1 tablet (500 mg total) by mouth daily as needed for muscle spasms.    Dispense:  30 tablet    Refill:  5    Add refills - changed prescriber from Dr Dossie Arbour to now PCP Dr Parks Ranger  . omeprazole (PRILOSEC) 20 MG capsule    Sig: Take 1 capsule (20 mg total) by mouth daily.    Dispense:  90 capsule    Refill:  1  . traZODone (DESYREL) 50 MG tablet    Sig: Take 1 tablet (50 mg total) by mouth at bedtime.    Dispense:  90 tablet    Refill:  3    Add refills    Follow up plan: Return in about 6 months (around 11/01/2020) for 1-2 week fasting lab only then return in 6 months follow-up.  Labs released for 1-2 weeks, may need repeat thyroid panel in 6 months.  Nobie Putnam, DO [facility] 05/03/2020, 11:59 AM

## 2020-05-03 NOTE — Patient Instructions (Addendum)
Thank you for coming to the office today.  Refilled 6 medicines, flu shot today  Keep up the good work.  Thyroid, Sugar, Cholesterol check  DUE for FASTING BLOOD WORK (no food or drink after midnight before the lab appointment, only water or coffee without cream/sugar on the morning of)  SCHEDULE "Lab Only" visit in the morning at the clinic for lab draw in 1-2 WEEKS   - Make sure Lab Only appointment is at about 1 week before your next appointment, so that results will be available  For Lab Results, once available within 2-3 days of blood draw, you can can log in to MyChart online to view your results and a brief explanation. Also, we can discuss results at next follow-up visit.   Please schedule a Follow-up Appointment to: Return in about 6 months (around 11/01/2020) for 1-2 week fasting lab only then return in 6 months follow-up.  If you have any other questions or concerns, please feel free to call the office or send a message through [facility]. You may also schedule an earlier appointment if necessary.  Additionally, you may be receiving a survey about your experience at our office within a few days to 1 week by e-mail or mail. We value your feedback.  Nobie Putnam, DO [place]

## 2020-05-04 ENCOUNTER — Other Ambulatory Visit: Payer: Self-pay | Admitting: Family Medicine

## 2020-05-04 DIAGNOSIS — I129 Hypertensive chronic kidney disease with stage 1 through stage 4 chronic kidney disease, or unspecified chronic kidney disease: Secondary | ICD-10-CM | POA: Insufficient documentation

## 2020-05-04 DIAGNOSIS — N1831 Chronic kidney disease, stage 3a: Secondary | ICD-10-CM | POA: Insufficient documentation

## 2020-05-04 NOTE — Assessment & Plan Note (Signed)
Previously controlled Return 1-2 week for thyroid labs Keep current levothyroxine 182mcg daily for now

## 2020-05-04 NOTE — Assessment & Plan Note (Signed)
Chronic problem Followed by [facility] Management Will take over the Methocarbamol as requested by them, send new rx and changed provider

## 2020-05-04 NOTE — Assessment & Plan Note (Signed)
Prior elevated lipids Check fasting lab 1-2 week Declines statin

## 2020-05-04 NOTE — Assessment & Plan Note (Signed)
Stable, chronic anxiety, with associated insomnia Chronically on BDZ without history of abuse or dose increase Checked [place] CSRS without red flag  Plan: 1. Reviewed chronic BDZ use for anxiety, high risk sedation, fall, confusion, withdrawal risks - med tolerated very well, aware not to increase dose - Continue Clonazepam 0.5mg  HALF tab BID - await refill request

## 2020-05-04 NOTE — Assessment & Plan Note (Signed)
Stable, chronic underlying problem likely related to variety factors age altered sleep cycle, anxiety, chronic pain  Plan: 1. Continue Trazodone continue 50mg  whole tab nightly Continue BDZ Clonazepam

## 2020-05-04 NOTE — Assessment & Plan Note (Signed)
Controlled BP Complicated by CKD-III  Plan: 1. Continue current regimen - Amlodipine 5mg daily, Lisinopril 40mg daily, Metoprolol XL 100mg daily - May continue Lasix 30mg daily now per Nephrology - refilled 2. Encourage to stay active, low sodium diet, improve hydration with water 

## 2020-05-12 ENCOUNTER — Ambulatory Visit (INDEPENDENT_AMBULATORY_CARE_PROVIDER_SITE_OTHER): Payer: Medicare Other | Admitting: Vascular Surgery

## 2020-05-12 ENCOUNTER — Encounter (INDEPENDENT_AMBULATORY_CARE_PROVIDER_SITE_OTHER): Payer: Self-pay | Admitting: Vascular Surgery

## 2020-05-12 ENCOUNTER — Other Ambulatory Visit: Payer: Self-pay

## 2020-05-12 ENCOUNTER — Ambulatory Visit (INDEPENDENT_AMBULATORY_CARE_PROVIDER_SITE_OTHER): Payer: Medicare Other

## 2020-05-12 VITALS — BP 159/84 | HR 80 | Resp 16 | Wt 121.0 lb

## 2020-05-12 DIAGNOSIS — G8929 Other chronic pain: Secondary | ICD-10-CM

## 2020-05-12 DIAGNOSIS — N1831 Chronic kidney disease, stage 3a: Secondary | ICD-10-CM | POA: Diagnosis not present

## 2020-05-12 DIAGNOSIS — E782 Mixed hyperlipidemia: Secondary | ICD-10-CM | POA: Diagnosis not present

## 2020-05-12 DIAGNOSIS — I714 Abdominal aortic aneurysm, without rupture, unspecified: Secondary | ICD-10-CM

## 2020-05-12 DIAGNOSIS — M545 Low back pain, unspecified: Secondary | ICD-10-CM

## 2020-05-12 DIAGNOSIS — I10 Essential (primary) hypertension: Secondary | ICD-10-CM | POA: Diagnosis not present

## 2020-05-12 NOTE — Progress Notes (Signed)
MRN : [id number]  Brittney Simpson is a 86 y.o. (02-May-1928) female who presents with chief complaint of  Chief Complaint  Patient presents with  . Follow-up    ultrasound follow up  .  History of Present Illness: Patient returns today in follow up of her abdominal aortic aneurysm.  She is doing well today without any specific complaints.  She denies any aneurysm related symptoms over that her chronic low back pain. Aortic duplex today shows a stable 4.6 cm abdominal aortic aneurysm.  This has not changed from her previous study.  Current Outpatient Medications  Medication Sig Dispense Refill  . acetaminophen (TYLENOL) 500 MG tablet Take 1,000 mg by mouth daily as needed for moderate pain or headache.    Marland Kitchen amLODipine (NORVASC) 5 MG tablet Take 1 tablet (5 mg total) by mouth daily. 90 tablet 3  . clonazePAM (KLONOPIN) 0.5 MG tablet TAKE 1/2 TABLET BY MOUTH TWICE DAILY 30 tablet 2  . Cranberry 500 MG CAPS Take 500 mg by mouth daily.     . Ergocalciferol (VITAMIN D2) 2000 UNITS TABS Take 2,000 Units by mouth daily with lunch.     . furosemide (LASIX) 20 MG tablet Take 1.5 tablets (30 mg total) by mouth daily. 45 tablet 5  . latanoprost (XALATAN) 0.005 % ophthalmic solution Place 1 drop into both eyes at bedtime.     Marland Kitchen levothyroxine (SYNTHROID) 112 MCG tablet TAKE 1 TABLET BY MOUTH ONCE DAILY ON AN EMPTY STOMACH. WAIT 30 MINS BEFORE TAKING OTHER MEDS. (Patient taking differently: Take 112 mcg by mouth daily before breakfast. ON AN EMPTY STOMACH. WAIT 30 MINS BEFORE TAKING OTHER MEDS.) 90 tablet 3  . lisinopril (ZESTRIL) 40 MG tablet Take 1 tablet (40 mg total) by mouth daily. 90 tablet 3  . Melatonin 10 MG TABS Take 10 mg by mouth at bedtime.    . methocarbamol (ROBAXIN) 500 MG tablet Take 1 tablet (500 mg total) by mouth daily as needed for muscle spasms. 30 tablet 5  . metoprolol succinate (TOPROL-XL) 100 MG 24 hr tablet TAKE 1 TABLET BY MOUTH ONCE DAILY WITH FOOD 30 tablet 5  .  omeprazole (PRILOSEC) 20 MG capsule Take 1 capsule (20 mg total) by mouth daily. 90 capsule 1  . simethicone (GAS-X) 80 MG chewable tablet Chew 80 mg by mouth 2 (two) times daily.    Marland Kitchen sulfamethoxazole-trimethoprim (BACTRIM) 400-80 MG tablet Take 1 tablet by mouth daily.     . traMADol (ULTRAM) 50 MG tablet Take 1 tablet (50 mg total) by mouth 3 (three) times daily. 270 tablet 0  . traZODone (DESYREL) 50 MG tablet Take 1 tablet (50 mg total) by mouth at bedtime. 90 tablet 3   No current facility-administered medications for this visit.    Past Medical History:  Diagnosis Date  . Anxiety   . Back ache   . Hiatal hernia   . Hyperlipidemia   . Hypertension   . Hypothyroid   . Osteopenia   . Thyroid disease   . UTI (urinary tract infection)     Past Surgical History:  Procedure Laterality Date  . HEMORRHOID SURGERY    . HERNIA REPAIR    . HERNIA REPAIR    . KYPHOPLASTY N/A 01/23/2017   Procedure: [id number];  Surgeon: Hessie Knows, MD;  Location: [facility] ORS;  Service: Orthopedics;  Laterality: N/A;  . KYPHOPLASTY N/A 01/25/2020   Procedure: L4 KYPHOPLASTY;  Surgeon: Hessie Knows, MD;  Location: [facility] ORS;  Service: Orthopedics;  Laterality: N/A;     Social History        Tobacco Use  . Smoking status: Never Smoker  . Smokeless tobacco: Never Used  Substance Use Topics  . Alcohol use: No    Alcohol/week: 0.0 standard drinks  . Drug use: No         Family History  Problem Relation Age of Onset  . Stroke Mother   . Cancer Father   No bleeding or clotting disorders.  No aneurysms.   No Known Allergies  REVIEW OF SYSTEMS(Negative unless checked)  Constitutional: [] ????Weight loss[] ????Fever[] ????Chills Cardiac:[] ????Chest pain[] ????Chest pressure[] ????Palpitations [] ????Shortness of breath when laying flat [] ????Shortness of breath at rest [] ????Shortness of breath with exertion. Vascular: [] ????Pain in legs with walking[] ????Pain  in legsat rest[] ????Pain in legs when laying flat [] ????Claudication [] ????Pain in feet when walking [] ????Pain in feet at rest [] ????Pain in feet when laying flat [] ????History of DVT [] ????Phlebitis [] ????Swelling in legs [] ????Varicose veins [] ????Non-healing ulcers Pulmonary: [] ????Uses home oxygen [] ????Productive cough[] ????Hemoptysis [] ????Wheeze [] ????COPD [] ????Asthma Neurologic: [] ????Dizziness [] ????Blackouts [] ????Seizures [] ????History of stroke [] ????History of TIA[] ????Aphasia [] ????Temporary blindness[] ????Dysphagia [] ????Weaknessor numbness in arms [] ????Weakness or numbnessin legs Musculoskeletal: [x] ????Arthritis [] ????Joint swelling [] ????Joint pain [x] ????Low back pain Hematologic:[] ????Easy bruising[] ????Easy bleeding [] ????Hypercoagulable state [] ????Anemic [] ????Hepatitis Gastrointestinal:[] ????Blood in stool[] ????Vomiting blood[] ????Gastroesophageal reflux/heartburn[] ????Abdominal pain Genitourinary: [] ????Chronic kidney disease [] ????Difficulturination [x] ????Frequenturination [] ????Burning with urination[] ????Hematuria Skin: [] ????Rashes [] ????Ulcers [] ????Wounds Psychological: [x] ????History of anxiety[] ????History of major depression    Physical Examination  BP (!) 159/84 (BP Location: Left Arm)   Pulse 80   Resp 16   Wt 121 lb (54.9 kg)   BMI 20.14 kg/m  Gen:  WD/WN, NAD. Appears younger than stated age. Head: [place]/AT, No temporalis wasting. Ear/Nose/Throat: Hearing grossly intact, nares w/o erythema or drainage Eyes: Conjunctiva clear. Sclera non-icteric Neck: Supple.  Trachea midline Pulmonary:  Good air movement, no use of accessory muscles.  Cardiac: RRR, no JVD Vascular:  Vessel Right Left  Radial Palpable Palpable                                   Gastrointestinal: soft, non-tender/non-distended. No guarding/reflex.  Increased aortic  impulse Musculoskeletal: M/S 5/5 throughout.  No deformity or atrophy. No edema. Neurologic: Sensation grossly intact in extremities.  Symmetrical.  Speech is fluent.  Psychiatric: Judgment intact, Mood & affect appropriate for pt's clinical situation. Dermatologic: No rashes or ulcers noted.  No cellulitis or open wounds.       Labs No results found for this or any previous visit (from the past 2160 hour(s)).  Radiology No results found.  Assessment/Plan Essential hypertension blood pressure control important in reducing the progression of atherosclerotic disease and aneurysmal degeneration. On appropriate oral medications.   Hyperlipidemia lipid control important in reducing the progression of atherosclerotic disease. Continue statin therapy   Chronic low back pain (Bilateral) (R>L) Thisnot likely from her aneurysm  CKD (chronic kidney disease) stage 3, GFR 30-59 ml/min Seesnephrology. We will try to avoid any contrast administration unless the aneurysm is reaching a threshold size for consideration for repair.  AAA (abdominal aortic aneurysm) without rupture (HCC) Aortic duplex today shows a stable 4.6 cm abdominal aortic aneurysm.  This has not changed from her previous study.  No worrisome symptoms.  Blood pressure control important for reducing risk of growth.  Recheck in 6 months.    Leotis Pain, MD  05/12/2020 8:54 AM    This note was created with Dragon medical transcription system.  Any errors from dictation are purely unintentional

## 2020-05-12 NOTE — Assessment & Plan Note (Signed)
Aortic duplex today shows a stable 4.6 cm abdominal aortic aneurysm.  This has not changed from her previous study.  No worrisome symptoms.  Blood pressure control important for reducing risk of growth.  Recheck in 6 months.

## 2020-05-16 ENCOUNTER — Other Ambulatory Visit: Payer: Self-pay

## 2020-05-16 ENCOUNTER — Other Ambulatory Visit: Payer: Medicare Other

## 2020-05-16 DIAGNOSIS — E034 Atrophy of thyroid (acquired): Secondary | ICD-10-CM | POA: Diagnosis not present

## 2020-05-16 DIAGNOSIS — F419 Anxiety disorder, unspecified: Secondary | ICD-10-CM | POA: Diagnosis not present

## 2020-05-16 DIAGNOSIS — I129 Hypertensive chronic kidney disease with stage 1 through stage 4 chronic kidney disease, or unspecified chronic kidney disease: Secondary | ICD-10-CM | POA: Diagnosis not present

## 2020-05-16 DIAGNOSIS — E782 Mixed hyperlipidemia: Secondary | ICD-10-CM | POA: Diagnosis not present

## 2020-05-16 DIAGNOSIS — R7309 Other abnormal glucose: Secondary | ICD-10-CM | POA: Diagnosis not present

## 2020-05-16 DIAGNOSIS — N183 Chronic kidney disease, stage 3 unspecified: Secondary | ICD-10-CM | POA: Diagnosis not present

## 2020-05-17 LAB — CBC WITH DIFFERENTIAL/PLATELET
Absolute Monocytes: 523 cells/uL (ref 200–950)
Basophils Absolute: 19 cells/uL (ref 0–200)
Basophils Relative: 0.4 %
Eosinophils Absolute: 10 cells/uL — ABNORMAL LOW (ref 15–500)
Eosinophils Relative: 0.2 %
HCT: 39.7 % (ref 35.0–45.0)
Hemoglobin: 12.7 g/dL (ref 11.7–15.5)
Lymphs Abs: 1891 cells/uL (ref 850–3900)
MCH: 27.4 pg (ref 27.0–33.0)
MCHC: 32 g/dL (ref 32.0–36.0)
MCV: 85.6 fL (ref 80.0–100.0)
MPV: 10.2 fL (ref 7.5–12.5)
Monocytes Relative: 10.9 %
Neutro Abs: 2357 cells/uL (ref 1500–7800)
Neutrophils Relative %: 49.1 %
Platelets: 236 10*3/uL (ref 140–400)
RBC: 4.64 10*6/uL (ref 3.80–5.10)
RDW: 13.1 % (ref 11.0–15.0)
Total Lymphocyte: 39.4 %
WBC: 4.8 10*3/uL (ref 3.8–10.8)

## 2020-05-17 LAB — HEMOGLOBIN A1C
Hgb A1c MFr Bld: 5.2 % of total Hgb (ref <5.7)
Mean Plasma Glucose: 103 (calc)
eAG (mmol/L): 5.7 (calc)

## 2020-05-17 LAB — LIPID PANEL
Cholesterol: 256 mg/dL — ABNORMAL HIGH (ref <200)
HDL: 40 mg/dL — ABNORMAL LOW (ref >=50)
LDL Cholesterol (Calc): 183 mg/dL (calc) — ABNORMAL HIGH
Non-HDL Cholesterol (Calc): 216 mg/dL (calc) — ABNORMAL HIGH (ref <130)
Total CHOL/HDL Ratio: 6.4 (calc) — ABNORMAL HIGH (ref <5.0)
Triglycerides: 176 mg/dL — ABNORMAL HIGH (ref <150)

## 2020-05-17 LAB — COMPLETE METABOLIC PANEL WITH GFR
AG Ratio: 1.4 (calc) (ref 1.0–2.5)
ALT: 9 U/L (ref 6–29)
AST: 13 U/L (ref 10–35)
Albumin: 3.9 g/dL (ref 3.6–5.1)
Alkaline phosphatase (APISO): 70 U/L (ref 37–153)
BUN: 8 mg/dL (ref 7–25)
CO2: 28 mmol/L (ref 20–32)
Calcium: 9.7 mg/dL (ref 8.6–10.4)
Chloride: 98 mmol/L (ref 98–110)
Creat: 0.79 mg/dL (ref 0.60–0.88)
GFR, Est African American: 76 mL/min/{1.73_m2} (ref >=60)
GFR, Est Non African American: 66 mL/min/{1.73_m2} (ref >=60)
Globulin: 2.8 g/dL (calc) (ref 1.9–3.7)
Glucose, Bld: 101 mg/dL — ABNORMAL HIGH (ref 65–99)
Potassium: 4.3 mmol/L (ref 3.5–5.3)
Sodium: 136 mmol/L (ref 135–146)
Total Bilirubin: 0.4 mg/dL (ref 0.2–1.2)
Total Protein: 6.7 g/dL (ref 6.1–8.1)

## 2020-05-17 LAB — T4, FREE: Free T4: 1.7 ng/dL (ref 0.8–1.8)

## 2020-05-17 LAB — TSH: TSH: 0.23 mIU/L — ABNORMAL LOW (ref 0.40–4.50)

## 2020-05-19 ENCOUNTER — Other Ambulatory Visit: Payer: Self-pay | Admitting: Pain Medicine

## 2020-05-19 DIAGNOSIS — G894 Chronic pain syndrome: Secondary | ICD-10-CM

## 2020-05-31 DIAGNOSIS — H401133 Primary open-angle glaucoma, bilateral, severe stage: Secondary | ICD-10-CM | POA: Diagnosis not present

## 2020-07-03 ENCOUNTER — Telehealth: Payer: Self-pay | Admitting: Pain Medicine

## 2020-07-03 NOTE — Telephone Encounter (Signed)
Daughter called asking if patient can get (no sedation): (L) T12-L1 LESI #1 as this helped tremendously with her pain. They would like to come in Tue  Dec 14 if possible. This would be #2. Please let me know if I can schedule

## 2020-07-09 NOTE — Progress Notes (Deleted)
PROVIDER NOTE: Information contained herein reflects review and annotations entered in association with encounter. Interpretation of such information and data should be left to medically-trained personnel. Information provided to patient can be located elsewhere in the medical record under "Patient Instructions". Document created using STT-dictation technology, any transcriptional errors that may result from process are unintentional.    Patient: Brittney Simpson  Service Category: E/M  Provider: Gaspar Cola, MD  DOB: [birth year]  DOS: 07/12/2020  Specialty: Interventional Pain Management  MRN: [id number]  Setting: Ambulatory outpatient  PCP: Olin Hauser, DO  Type: Established Patient    Referring Provider: Nobie Putnam *  Location: Office  Delivery: Face-to-face     HPI  [name], a 86 y.o. year old female, is here today because of her No primary diagnosis found.. Brittney Simpson's primary complain today is No chief complaint on file. Last encounter: My last encounter with her was on 07/11/2020. Pertinent problems: Brittney Simpson has Lumbar facet syndrome (Bilateral) (R>L); Chronic low back pain (Primary Area of Pain) (Bilateral) (R>L); Spondylolisthesis of lumbosacral region (L2-3 and L5-S1); Lumbar spinal stenosis (9 mm at L3-4); Trochanteric bursitis (Right); Chronic hip pain (Secondary area of Pain) (Right); Osteoarthritis of hip (Right); Chronic sacroiliac joint pain (Bilateral) (R>L); Chronic lumbar radicular pain (Right); Chronic lower extremity pain (Third area of Pain) (Right); Myofascial pain; Spondylosis without myelopathy or radiculopathy, lumbosacral region; Ankle edema, bilateral; Abnormal MRI, lumbar spine (01/10/2017); T12 compression fracture, sequela; Lumbar facet hypertrophy; Lumbar lateral recess stenosis; Lumbar foraminal stenosis; Non-traumatic compression fracture of T7 thoracic vertebra, sequela; Chronic pain syndrome; DDD (degenerative disc  disease), lumbosacral; Edema of lower extremity; Acute exacerbation of chronic low back pain; Acute mid back pain; Somatic dysfunction of sacroiliac joints (Bilateral); Other spondylosis, sacral and sacrococcygeal region; Spasm of back muscles; and DDD (degenerative disc disease), thoracolumbar on their pertinent problem list. Pain Assessment: Severity of   is reported as a  /10. Location:    / . Onset:  . Quality:  . Timing:  . Modifying factor(s):  Marland Kitchen Vitals:  vitals were not taken for this visit.   Reason for encounter: medication management. ***  Pharmacotherapy Assessment   Analgesic: Tramadol 50 mg, 1 tab PO q 8 hrs (150 mg/day of tramadol) MME/day: 15 mg/day.   Monitoring: [place] PMP: PDMP reviewed during this encounter.       Pharmacotherapy: No side-effects or adverse reactions reported. Compliance: No problems identified. Effectiveness: Clinically acceptable.  No notes on file  UDS:  Summary  Date Value Ref Range Status  10/28/2017 FINAL  Final    Comment:    ==================================================================== TOXASSURE SELECT 13 (MW) ==================================================================== Test                             Result       Flag       Units Drug Present and Declared for Prescription Verification   Tramadol                       >20000       EXPECTED   ng/mg creat   O-Desmethyltramadol            >20000       EXPECTED   ng/mg creat   N-Desmethyltramadol            8244         EXPECTED   ng/mg creat    Source  of tramadol is a prescription medication.    O-desmethyltramadol and N-desmethyltramadol are expected    metabolites of tramadol. Drug Present not Declared for Prescription Verification   7-aminoclonazepam              248          UNEXPECTED ng/mg creat    7-aminoclonazepam is an expected metabolite of clonazepam. Source    of clonazepam is a scheduled prescription  medication. ==================================================================== Test                      Result    Flag   Units      Ref Range   Creatinine              25               mg/dL      >=20 ==================================================================== Declared Medications:  The flagging and interpretation on this report are based on the  following declared medications.  Unexpected results may arise from  inaccuracies in the declared medications.  **Note: The testing scope of this panel includes these medications:  Tramadol  **Note: The testing scope of this panel does not include following  reported medications:  Acetaminophen  Amlodipine  Brimonidine Tartrate  Cranberry Extract  Furosemide  Latanoprost  Levothyroxine  Lidocaine  Lisinopril  Metoprolol  Omeprazole  Timolol  Trazodone  Vitamin D2 ==================================================================== For clinical consultation, please call [phone]. ====================================================================      ROS  Constitutional: Denies any fever or chills Gastrointestinal: No reported hemesis, hematochezia, vomiting, or acute GI distress Musculoskeletal: Denies any acute onset joint swelling, redness, loss of ROM, or weakness Neurological: No reported episodes of acute onset apraxia, aphasia, dysarthria, agnosia, amnesia, paralysis, loss of coordination, or loss of consciousness  Medication Review  Cranberry, Melatonin, Vitamin D2, acetaminophen, amLODipine, clonazePAM, furosemide, latanoprost, levothyroxine, lisinopril, methocarbamol, metoprolol succinate, omeprazole, simethicone, sulfamethoxazole-trimethoprim, traMADol, and traZODone  History Review  Allergy: Brittney Simpson has No Known Allergies. Drug: Brittney Simpson  reports no history of drug use. Alcohol:  reports no history of alcohol use. Tobacco:  reports that she has never smoked. She has never used smokeless  tobacco. Social: Brittney Simpson  reports that she has never smoked. She has never used smokeless tobacco. She reports that she does not drink alcohol and does not use drugs. Medical:  has a past medical history of Anxiety, Back ache, Hiatal hernia, Hyperlipidemia, Hypertension, Hypothyroid, Osteopenia, Thyroid disease, and UTI (urinary tract infection). Surgical: Brittney Simpson  has a past surgical history that includes Hernia repair; Hernia repair; Hemorrhoid surgery; Kyphoplasty (N/A, 01/23/2017); and Kyphoplasty (N/A, 01/25/2020). Family: family history includes Cancer in her father; Stroke in her mother.  Laboratory Chemistry Profile   Renal Lab Results  Component Value Date   BUN 8 05/16/2020   CREATININE 0.79 27/25/3664   BCR NOT APPLICABLE 40/34/7425   GFRAA 76 05/16/2020   GFRNONAA 66 05/16/2020     Hepatic Lab Results  Component Value Date   AST 13 05/16/2020   ALT 9 05/16/2020   ALBUMIN 3.6 11/22/2016   ALKPHOS 59 11/22/2016   LIPASE 21 06/14/2016     Electrolytes Lab Results  Component Value Date   NA 136 05/16/2020   K 4.3 05/16/2020   CL 98 05/16/2020   CALCIUM 9.7 05/16/2020   MG 2.1 01/14/2014     Bone No results found for: VD25OH, VD125OH2TOT, [id number], PP2951OA4, 25OHVITD1, 25OHVITD2, 25OHVITD3, TESTOFREE, TESTOSTERONE   Inflammation (CRP: Acute  Phase) (ESR: Chronic Phase) No results found for: CRP, ESRSEDRATE, LATICACIDVEN     Note: Above Lab results reviewed.  Recent Imaging Review  AAA Duplex ABDOMINAL AORTA STUDY  Limitations: Air/bowel gas.    Comparison Study: 10/26/2019  Performing Technologist: Charlane Ferretti RT (R)(VS)    Examination Guidelines: A complete evaluation includes B-mode imaging, spectral Doppler, color Doppler, and power Doppler as needed of all accessible portions of each vessel. Bilateral testing is considered an integral part of a complete examination. Limited examinations for reoccurring indications may be performed as  noted.    Abdominal Aorta Findings: +-----------+-------+----------+----------+--------+--------+--------+ Location   AP (cm)Trans (cm)PSV (cm/s)WaveformThrombusComments +-----------+-------+----------+----------+--------+--------+--------+ Proximal   3.70   4.16      51                                 +-----------+-------+----------+----------+--------+--------+--------+ Mid        3.93   4.60      205                                +-----------+-------+----------+----------+--------+--------+--------+ Distal     3.30   3.60      162                                +-----------+-------+----------+----------+--------+--------+--------+ RT CIA Prox1.3    1.3       113                                +-----------+-------+----------+----------+--------+--------+--------+ LT CIA Prox1.3    1.3       112                                +-----------+-------+----------+----------+--------+--------+--------+    Summary: Abdominal Aorta: There is evidence of abnormal dilatation of the proximal, mid and distal Abdominal aorta. The largest aortic measurement is 4.6 cm. The largest aortic diameter remains essentially unchanged compared to prior exam. Previous diameter  measurement was 4.6 cm obtained on 10/26/2019.   *See table(s) above for measurements and observations.   Electronically signed by Leotis Pain MD on 05/12/2020 at 9:57:13 AM.    Final   Note: Reviewed        Physical Exam  General appearance: Well nourished, well developed, and well hydrated. In no apparent acute distress Mental status: Alert, oriented x 3 (person, place, & time)       Respiratory: No evidence of acute respiratory distress Eyes: PERLA Vitals: There were no vitals taken for this visit. BMI: Estimated body mass index is 20.14 kg/m as calculated from the following:   Height as of 05/03/20: _0  (1.651 m).   Weight as of 05/12/20: 121 lb (54.9 kg). Ideal: Patient weight  not recorded  Assessment   Status Diagnosis  Controlled Controlled Controlled No diagnosis found.   Updated Problems: No problems updated.  Plan of Care  Problem-specific:  No problem-specific Assessment & Plan notes found for this encounter.  Brittney Simpson has a current medication list which includes the following long-term medication(s): amlodipine, clonazepam, furosemide, levothyroxine, lisinopril, methocarbamol, metoprolol succinate, omeprazole, simethicone, tramadol, and trazodone.  Pharmacotherapy (Medications Ordered): No orders of the defined types were placed in  this encounter.  Orders:  No orders of the defined types were placed in this encounter.  Follow-up plan:   No follow-ups on file.      Interventional management options:  Considering: Possible bilateral lumbar facet RFA. (Offerred several times. Pt. chooses to hold.) Diagnostic right hip joint injection Diagnostic right L3 TFESI Diagnostic right trochanteric bursa injection Diagnostic right SI joint block Diagnostic midline caudal ESI (for the tailbone pain)   Palliative PRN treatment(s): Palliative left T12-L1 LESI #2  Palliativeright lumbar facet block #12 Palliativeleft lumbar facet block #10 Palliative right L3-4 LESI     Recent Visits Date Type Provider Dept  04/11/20 Telemedicine Milinda Pointer, [place] recent visits within past 90 days and meeting all other requirements Future Appointments Date Type Provider Dept  07/11/20 Appointment Milinda Pointer, [facility]  07/12/20 Appointment Milinda Pointer, MD [facility]  Showing future appointments within next 90 days and meeting all other requirements  I discussed the assessment and treatment plan with the patient. The patient was provided an opportunity to ask questions and all were answered. The patient agreed with the plan and demonstrated an understanding of  the instructions.  Patient advised to call back or seek an in-person evaluation if the symptoms or condition worsens.  Duration of encounter: *** minutes.  Note by: Gaspar Cola, MD Date: 07/12/2020; Time: 1:32 PM

## 2020-07-09 NOTE — Progress Notes (Signed)
PROVIDER NOTE: Information contained herein reflects review and annotations entered in association with encounter. Interpretation of such information and data should be left to medically-trained personnel. Information provided to patient can be located elsewhere in the medical record under "Patient Instructions". Document created using STT-dictation technology, any transcriptional errors that may result from process are unintentional.    Patient: Brittney Simpson  Service Category: Procedure  Provider: Gaspar Cola, MD  DOB: 09-Jun-1928  DOS: 07/11/2020  Location: [facility]  MRN: [id number]  Setting: Ambulatory - outpatient  Referring Provider: Nobie Putnam *  Type: Established Patient  Specialty: Interventional Pain Management  PCP: Olin Hauser, DO   Primary Reason for Visit: Interventional Pain Management Treatment. CC: Back Pain (Low, bilateral)  Procedure:          Anesthesia, Analgesia, Anxiolysis:  Type: Therapeutic Inter-Laminar Epidural Steroid Injection  #2  Region: Lumbar Level: T12-L1 Level. Laterality: Left Paramedial  Type: Local Anesthesia Indication(s): Analgesia         Route: Infiltration (Stollings/IM) IV Access: Declined Sedation: Declined  Local Anesthetic: Lidocaine 1-2%  Position: Prone with head of the table was raised to facilitate breathing.   Indications: 1. Chronic low back pain (Primary Area of Pain) (Bilateral) (R>L)   2. DDD (degenerative disc disease), thoracolumbar   3. Spondylolisthesis of lumbosacral region (L2-3 and L5-S1)   4. T12 compression fracture, sequela   5. Spinal stenosis of lumbar region with neurogenic claudication   6. DDD (degenerative disc disease), lumbosacral   7. Chronic pain syndrome    Pain Score: Pre-procedure: 8 /10 Post-procedure: 0-No pain/10   Pre-op H&P Assessment:  Brittney Simpson is a 86 y.o. (year old), female patient, seen today for interventional treatment. She  has a past  surgical history that includes Hernia repair; Hernia repair; Hemorrhoid surgery; Kyphoplasty (N/A, 01/23/2017); and Kyphoplasty (N/A, 01/25/2020). Brittney Simpson has a current medication list which includes the following prescription(s): acetaminophen, amlodipine, clonazepam, cranberry, vitamin d2, furosemide, latanoprost, levothyroxine, lisinopril, melatonin, methocarbamol, metoprolol succinate, omeprazole, simethicone, sulfamethoxazole-trimethoprim, trazodone, and tramadol. Her primarily concern today is the Back Pain (Low, bilateral)  Initial Vital Signs:  Pulse/HCG Rate: 67ECG Heart Rate: 79 Temp: (!) 97.3 F (36.3 C) Resp: 16 BP: (!) 135/99 SpO2: 99 %  BMI: Estimated body mass index is 20.43 kg/m as calculated from the following:   Height as of this encounter: 5\' 4"  (1.626 m).   Weight as of this encounter: 119 lb (54 kg).  Risk Assessment: Allergies: Reviewed. She has No Known Allergies.  Allergy Precautions: None required Coagulopathies: Reviewed. None identified.  Blood-thinner therapy: None at this time Active Infection(s): Reviewed. None identified. Brittney Simpson is afebrile  Site Confirmation: Brittney Simpson was asked to confirm the procedure and laterality before marking the site Procedure checklist: Completed Consent: Before the procedure and under the influence of no sedative(s), amnesic(s), or anxiolytics, the patient was informed of the treatment options, risks and possible complications. To fulfill our ethical and legal obligations, as recommended by the American Medical Association's Code of Ethics, I have informed the patient of my clinical impression; the nature and purpose of the treatment or procedure; the risks, benefits, and possible complications of the intervention; the alternatives, including doing nothing; the risk(s) and benefit(s) of the alternative treatment(s) or procedure(s); and the risk(s) and benefit(s) of doing nothing. The patient was provided information about  the general risks and possible complications associated with the procedure. These may include, but are not limited to: failure to achieve desired  goals, infection, bleeding, organ or nerve damage, allergic reactions, paralysis, and death. In addition, the patient was informed of those risks and complications associated to Spine-related procedures, such as failure to decrease pain; infection (i.e.: Meningitis, epidural or intraspinal abscess); bleeding (i.e.: epidural hematoma, subarachnoid hemorrhage, or any other type of intraspinal or peri-dural bleeding); organ or nerve damage (i.e.: Any type of peripheral nerve, nerve root, or spinal cord injury) with subsequent damage to sensory, motor, and/or autonomic systems, resulting in permanent pain, numbness, and/or weakness of one or several areas of the body; allergic reactions; (i.e.: anaphylactic reaction); and/or death. Furthermore, the patient was informed of those risks and complications associated with the medications. These include, but are not limited to: allergic reactions (i.e.: anaphylactic or anaphylactoid reaction(s)); adrenal axis suppression; blood sugar elevation that in diabetics may result in ketoacidosis or comma; water retention that in patients with history of congestive heart failure may result in shortness of breath, pulmonary edema, and decompensation with resultant heart failure; weight gain; swelling or edema; medication-induced neural toxicity; particulate matter embolism and blood vessel occlusion with resultant organ, and/or nervous system infarction; and/or aseptic necrosis of one or more joints. Finally, the patient was informed that Medicine is not an exact science; therefore, there is also the possibility of unforeseen or unpredictable risks and/or possible complications that may result in a catastrophic outcome. The patient indicated having understood very clearly. We have given the patient no guarantees and we have made no  promises. Enough time was given to the patient to ask questions, all of which were answered to the patient's satisfaction. Brittney Simpson has indicated that she wanted to continue with the procedure. Attestation: I, the ordering provider, attest that I have discussed with the patient the benefits, risks, side-effects, alternatives, likelihood of achieving goals, and potential problems during recovery for the procedure that I have provided informed consent. Date  Time: 07/11/2020  9:49 AM  Pre-Procedure Preparation:  Monitoring: As per clinic protocol. Respiration, ETCO2, SpO2, BP, heart rate and rhythm monitor placed and checked for adequate function Safety Precautions: Patient was assessed for positional comfort and pressure points before starting the procedure. Time-out: I initiated and conducted the "Time-out" before starting the procedure, as per protocol. The patient was asked to participate by confirming the accuracy of the "Time Out" information. Verification of the correct person, site, and procedure were performed and confirmed by me, the nursing staff, and the patient. "Time-out" conducted as per Joint Commission's Universal Protocol (UP.01.01.01). Time: 1018  Description of Procedure:          Target Area: The interlaminar space, initially targeting the lower laminar border of the superior vertebral body. Approach: Paramedial approach. Area Prepped: Entire Posterior Lumbar Region DuraPrep (Iodine Povacrylex [0.7% available iodine] and Isopropyl Alcohol, 74% w/w) Safety Precautions: Aspiration looking for blood return was conducted prior to all injections. At no point did we inject any substances, as a needle was being advanced. No attempts were made at seeking any paresthesias. Safe injection practices and needle disposal techniques used. Medications properly checked for expiration dates. SDV (single dose vial) medications used. Description of the Procedure: Protocol guidelines were  followed. The procedure needle was introduced through the skin, ipsilateral to the reported pain, and advanced to the target area. Bone was contacted and the needle walked caudad, until the lamina was cleared. The epidural space was identified using "loss-of-resistance technique" with 2-3 ml of PF-NaCl (0.9% NSS), in a 5cc LOR glass syringe.  Vitals:   07/11/20 1015 07/11/20  1019 07/11/20 1024 07/11/20 1030  BP: (!) 178/90 (!) 168/84 (!) 169/83 (!) 149/89  Pulse:      Resp: 15 15 14 15   Temp:      SpO2: 97% 96% 94% 95%  Weight:      Height:        Start Time: 1018 hrs. End Time: 1030 hrs.  Materials:  Needle(s) Type: Epidural needle Gauge: 17G Length: 3.5-in Medication(s): Please see orders for medications and dosing details.  Imaging Guidance (Spinal):          Type of Imaging Technique: Fluoroscopy Guidance (Spinal) Indication(s): Assistance in needle guidance and placement for procedures requiring needle placement in or near specific anatomical locations not easily accessible without such assistance. Exposure Time: Please see nurses notes. Contrast: Before injecting any contrast, we confirmed that the patient did not have an allergy to iodine, shellfish, or radiological contrast. Once satisfactory needle placement was completed at the desired level, radiological contrast was injected. Contrast injected under live fluoroscopy. No contrast complications. See chart for type and volume of contrast used. Fluoroscopic Guidance: I was personally present during the use of fluoroscopy. "Tunnel Vision Technique" used to obtain the best possible view of the target area. Parallax error corrected before commencing the procedure. "Direction-depth-direction" technique used to introduce the needle under continuous pulsed fluoroscopy. Once target was reached, antero-posterior, oblique, and lateral fluoroscopic projection used confirm needle placement in all planes. Images permanently stored in  EMR.    Interpretation: I personally interpreted the imaging intraoperatively. Adequate needle placement confirmed in multiple planes. Appropriate spread of contrast into desired area was observed. No evidence of afferent or efferent intravascular uptake. No intrathecal or subarachnoid spread observed. Permanent images saved into the patient's record.  Antibiotic Prophylaxis:   Anti-infectives (From admission, onward)   None     Indication(s): None identified  Post-operative Assessment:  Post-procedure Vital Signs:  Pulse/HCG Rate: 6771 Temp: (!) 97.3 F (36.3 C) Resp: 15 BP: (!) 149/89 SpO2: 95 %  EBL: None  Complications: No immediate post-treatment complications observed by team, or reported by patient.  Note: The patient tolerated the entire procedure well. A repeat set of vitals were taken after the procedure and the patient was kept under observation following institutional policy, for this type of procedure. Post-procedural neurological assessment was performed, showing return to baseline, prior to discharge. The patient was provided with post-procedure discharge instructions, including a section on how to identify potential problems. Should any problems arise concerning this procedure, the patient was given instructions to immediately contact us, at any time, without hesitation. In any case, we plan to contact the patient by telephone for a follow-up status report regarding this interventional procedure.  Comments:  No additional relevant information.  Plan of Care  Orders:  Orders Placed This Encounter  Procedures  . Lumbar Epidural Injection    Scheduling Instructions:     Procedure: Interlaminar LESI T12-L1     Laterality: Left-sided     Sedation: Patient's choice     Timeframe:  Today    Order Specific Question:   Where will this procedure be performed?    Answer:   [facility]  . [facility] C-ARM 1-60 MIN NO REPORT    Intraoperative interpretation  by procedural physician at [place].    Standing Status:   Standing    Number of Occurrences:   1    Order Specific Question:   Reason for exam:    Answer:   Assistance  in needle guidance and placement for procedures requiring needle placement in or near specific anatomical locations not easily accessible without such assistance.  . Informed Consent Details: Physician/Practitioner Attestation; Transcribe to consent form and obtain patient signature    Note: Always confirm laterality of pain with Ms. Wingerter, before procedure. Transcribe to consent form and obtain patient signature.    Order Specific Question:   Physician/Practitioner attestation of informed consent for procedure/surgical case    Answer:   I, the physician/practitioner, attest that I have discussed with the patient the benefits, risks, side effects, alternatives, likelihood of achieving goals and potential problems during recovery for the procedure that I have provided informed consent.    Order Specific Question:   Procedure    Answer:   Lumbar epidural steroid injection under fluoroscopic guidance    Order Specific Question:   Physician/Practitioner performing the procedure    Answer:   Ezma Rehm A. Dossie Arbour, MD    Order Specific Question:   Indication/Reason    Answer:   Low back and/or lower extremity pain secondary to lumbar radiculitis  . Provide equipment / supplies at bedside    "Epidural Tray" (Disposable  single use) Catheter: NOT required    Standing Status:   Standing    Number of Occurrences:   1    Order Specific Question:   Specify    Answer:   Epidural Tray   Chronic Opioid Analgesic:  Tramadol 50 mg, 1 tab PO q 8 hrs (150 mg/day of tramadol) MME/day: 15 mg/day.   Medications ordered for procedure: Meds ordered this encounter  Medications  . iohexol (OMNIPAQUE) 180 MG/ML injection 10 mL    Must be Myelogram-compatible. If not available, you may substitute with a water-soluble, non-ionic,  hypoallergenic, myelogram-compatible radiological contrast medium.  Marland Kitchen lidocaine (XYLOCAINE) 2 % (with pres) injection 400 mg  . sodium chloride flush (NS) 0.9 % injection 2 mL  . ropivacaine (PF) 2 mg/mL (0.2%) (NAROPIN) injection 2 mL  . triamcinolone acetonide (KENALOG-40) injection 40 mg  . traMADol (ULTRAM) 50 MG tablet    Sig: Take 1 tablet (50 mg total) by mouth 3 (three) times daily.    Dispense:  270 tablet    Refill:  0    Chronic Pain: STOP Act (Not applicable) Fill 1 day early if closed on refill date. Avoid benzodiazepines within 8 hours of opioids   Medications administered: We administered iohexol, lidocaine, sodium chloride flush, ropivacaine (PF) 2 mg/mL (0.2%), and triamcinolone acetonide.  See the medical record for exact dosing, route, and time of administration.  Follow-up plan:   Return in about 2 weeks (around 07/25/2020) for (VIrtual), (PP) Follow-up.       Interventional management options:  Considering: Possible bilateral lumbar facet RFA. (Offerred several times. Pt. chooses to hold.) Diagnostic right hip joint injection Diagnostic right L3 TFESI Diagnostic right trochanteric bursa injection Diagnostic right SI joint block Diagnostic midline caudal ESI (for the tailbone pain)   Palliative PRN treatment(s): Palliative left T12-L1 LESI #3  Palliativeright lumbar facet block #12 Palliativeleft lumbar facet block #10 Palliative right L3-4 LESI     Recent Visits No visits were found meeting these conditions. Showing recent visits within past 90 days and meeting all other requirements Today's Visits Date Type Provider Dept  07/11/20 Procedure visit Milinda Pointer, MD [facility]  Showing today's visits and meeting all other requirements Future Appointments Date Type Provider Dept  07/25/20 Appointment Milinda Pointer, MD [facility]  10/09/20 Appointment Milinda Pointer, MD  [facility]  Showing future  appointments within next 90 days and meeting all other requirements  Disposition: Discharge home  Discharge (Date  Time): 07/11/2020; 1035 hrs.   Primary Care Physician: Olin Hauser, DO Location: [facility] Note by: Gaspar Cola, MD Date: 07/11/2020; Time: 10:43 AM  Disclaimer:  Medicine is not an Chief Strategy Officer. The only guarantee in medicine is that nothing is guaranteed. It is important to note that the decision to proceed with this intervention was based on the information collected from the patient. The Data and conclusions were drawn from the patient's questionnaire, the interview, and the physical examination. Because the information was provided in large part by the patient, it cannot be guaranteed that it has not been purposely or unconsciously manipulated. Every effort has been made to obtain as much relevant data as possible for this evaluation. It is important to note that the conclusions that lead to this procedure are derived in large part from the available data. Always take into account that the treatment will also be dependent on availability of resources and existing treatment guidelines, considered by other Pain Management Practitioners as being common knowledge and practice, at the time of the intervention. For Medico-Legal purposes, it is also important to point out that variation in procedural techniques and pharmacological choices are the acceptable norm. The indications, contraindications, technique, and results of the above procedure should only be interpreted and judged by a Board-Certified Interventional Pain Specialist with extensive familiarity and expertise in the same exact procedure and technique.

## 2020-07-11 ENCOUNTER — Other Ambulatory Visit: Payer: Self-pay

## 2020-07-11 ENCOUNTER — Ambulatory Visit: Payer: Medicare Other | Attending: Pain Medicine | Admitting: Pain Medicine

## 2020-07-11 ENCOUNTER — Encounter: Payer: Self-pay | Admitting: Pain Medicine

## 2020-07-11 ENCOUNTER — Ambulatory Visit
Admission: RE | Admit: 2020-07-11 | Discharge: 2020-07-11 | Disposition: A | Discharge status: Home / Self Care | Payer: Medicare Other | Source: Ambulatory Visit | Attending: Pain Medicine | Admitting: Pain Medicine

## 2020-07-11 VITALS — BP 149/89 | HR 67 | Temp 97.3°F | Resp 15 | Ht 64.0 in | Wt 119.0 lb

## 2020-07-11 DIAGNOSIS — G8929 Other chronic pain: Secondary | ICD-10-CM | POA: Diagnosis not present

## 2020-07-11 DIAGNOSIS — M48062 Spinal stenosis, lumbar region with neurogenic claudication: Secondary | ICD-10-CM | POA: Diagnosis not present

## 2020-07-11 DIAGNOSIS — M545 Low back pain, unspecified: Secondary | ICD-10-CM

## 2020-07-11 DIAGNOSIS — M4317 Spondylolisthesis, lumbosacral region: Secondary | ICD-10-CM

## 2020-07-11 DIAGNOSIS — G894 Chronic pain syndrome: Secondary | ICD-10-CM | POA: Diagnosis not present

## 2020-07-11 DIAGNOSIS — M5135 Other intervertebral disc degeneration, thoracolumbar region: Secondary | ICD-10-CM | POA: Diagnosis not present

## 2020-07-11 DIAGNOSIS — S22080S Wedge compression fracture of T11-T12 vertebra, sequela: Secondary | ICD-10-CM

## 2020-07-11 DIAGNOSIS — M5137 Other intervertebral disc degeneration, lumbosacral region: Secondary | ICD-10-CM | POA: Diagnosis not present

## 2020-07-11 DIAGNOSIS — M51379 Other intervertebral disc degeneration, lumbosacral region without mention of lumbar back pain or lower extremity pain: Secondary | ICD-10-CM

## 2020-07-11 MED ORDER — ROPIVACAINE HCL 2 MG/ML IJ SOLN
2.0000 mL | Freq: Once | INTRAMUSCULAR | Status: AC
  Administered 2020-07-11: 2 mL via EPIDURAL

## 2020-07-11 MED ORDER — SODIUM CHLORIDE (PF) 0.9 % IJ SOLN
INTRAMUSCULAR | Status: AC
  Filled 2020-07-11: qty 10

## 2020-07-11 MED ORDER — TRIAMCINOLONE ACETONIDE 40 MG/ML IJ SUSP
40.0000 mg | Freq: Once | INTRAMUSCULAR | Status: AC
  Administered 2020-07-11: 40 mg

## 2020-07-11 MED ORDER — ROPIVACAINE HCL 2 MG/ML IJ SOLN
INTRAMUSCULAR | Status: AC
  Filled 2020-07-11: qty 10

## 2020-07-11 MED ORDER — LIDOCAINE HCL 2 % IJ SOLN
INTRAMUSCULAR | Status: AC
  Filled 2020-07-11: qty 20

## 2020-07-11 MED ORDER — LIDOCAINE HCL 2 % IJ SOLN
20.0000 mL | Freq: Once | INTRAMUSCULAR | Status: AC
  Administered 2020-07-11: 400 mg

## 2020-07-11 MED ORDER — TRIAMCINOLONE ACETONIDE 40 MG/ML IJ SUSP
INTRAMUSCULAR | Status: AC
  Filled 2020-07-11: qty 1

## 2020-07-11 MED ORDER — SODIUM CHLORIDE 0.9% FLUSH
2.0000 mL | Freq: Once | INTRAVENOUS | Status: AC
  Administered 2020-07-11: 2 mL

## 2020-07-11 MED ORDER — IOHEXOL 180 MG/ML  SOLN
10.0000 mL | Freq: Once | INTRAMUSCULAR | Status: AC
  Administered 2020-07-11: 10 mL via EPIDURAL
  Filled 2020-07-11: qty 20

## 2020-07-11 MED ORDER — TRAMADOL HCL 50 MG PO TABS: 50.0000 mg | ORAL_TABLET | Freq: Three times a day (TID) | ORAL | 0 refills | Status: DC

## 2020-07-11 NOTE — Patient Instructions (Addendum)
____________________________________________________________________________________________  Post-Procedure Discharge Instructions  Instructions:  Apply ice:   Purpose: This will minimize any swelling and discomfort after procedure.   When: Day of procedure, as soon as you get home.  How: Fill a plastic sandwich bag with crushed ice. Cover it with a small towel and apply to injection site.  How long: (15 min on, 15 min off) Apply for 15 minutes then remove x 15 minutes.  Repeat sequence on day of procedure, until you go to bed.  Apply heat:   Purpose: To treat any soreness and discomfort from the procedure.  When: Starting the next day after the procedure.  How: Apply heat to procedure site starting the day following the procedure.  How long: May continue to repeat daily, until discomfort goes away.  Food intake: Start with clear liquids (like water) and advance to regular food, as tolerated.   Physical activities: Keep activities to a minimum for the first 8 hours after the procedure. After that, then as tolerated.  Driving: If you have received any sedation, be responsible and do not drive. You are not allowed to drive for 24 hours after having sedation.  Blood thinner: (Applies only to those taking blood thinners) You may restart your blood thinner 6 hours after your procedure.  Insulin: (Applies only to Diabetic patients taking insulin) As soon as you can eat, you may resume your normal dosing schedule.  Infection prevention: Keep procedure site clean and dry. Shower daily and clean area with soap and water.  Post-procedure Pain Diary: Extremely important that this be done correctly and accurately. Recorded information will be used to determine the next step in treatment. For the purpose of accuracy, follow these rules:  Evaluate only the area treated. Do not report or include pain from an untreated area. For the purpose of this evaluation, ignore all other areas of pain,  except for the treated area.  After your procedure, avoid taking a long nap and attempting to complete the pain diary after you wake up. Instead, set your alarm clock to go off every hour, on the hour, for the initial 8 hours after the procedure. Document the duration of the numbing medicine, and the relief you are getting from it.  Do not go to sleep and attempt to complete it later. It will not be accurate. If you received sedation, it is likely that you were given a medication that may cause amnesia. Because of this, completing the diary at a later time may cause the information to be inaccurate. This information is needed to plan your care.  Follow-up appointment: Keep your post-procedure follow-up evaluation appointment after the procedure (usually 2 weeks for most procedures, 6 weeks for radiofrequencies). DO NOT FORGET to bring you pain diary with you.   Expect: (What should I expect to see with my procedure?)  From numbing medicine (AKA: Local Anesthetics): Numbness or decrease in pain. You may also experience some weakness, which if present, could last for the duration of the local anesthetic.  Onset: Full effect within 15 minutes of injected.  Duration: It will depend on the type of local anesthetic used. On the average, 1 to 8 hours.   From steroids (Applies only if steroids were used): Decrease in swelling or inflammation. Once inflammation is improved, relief of the pain will follow.  Onset of benefits: Depends on the amount of swelling present. The more swelling, the longer it will take for the benefits to be seen. In some cases, up to 10 days.    Duration: Steroids will stay in the system x 2 weeks. Duration of benefits will depend on multiple posibilities including persistent irritating factors.  Side-effects: If present, they may typically last 2 weeks (the duration of the steroids).  Frequent: Cramps (if they occur, drink Gatorade and take over-the-counter Magnesium 450-500 mg  once to twice a day); water retention with temporary weight gain; increases in blood sugar; decreased immune system response; increased appetite.  Occasional: Facial flushing (red, warm cheeks); mood swings; menstrual changes.  Uncommon: Long-term decrease or suppression of natural hormones; bone thinning. (These are more common with higher doses or more frequent use. This is why we prefer that our patients avoid having any injection therapies in other practices.)   Very Rare: Severe mood changes; psychosis; aseptic necrosis.  From procedure: Some discomfort is to be expected once the numbing medicine wears off. This should be minimal if ice and heat are applied as instructed.  Call if: (When should I call?)  You experience numbness and weakness that gets worse with time, as opposed to wearing off.  New onset bowel or bladder incontinence. (Applies only to procedures done in the spine)  Emergency Numbers:  Durning business hours (Monday - Thursday, 8:00 AM - 4:00 PM) (Friday, 9:00 AM - 12:00 Noon): [phone]  After hours: [phone]  NOTE: If you are having a problem and are unable connect with, or to talk to a provider, then go to your nearest urgent care or emergency department. If the problem is serious and urgent, please call 911. ____________________________________________________________________________________________   ____________________________________________________________________________________________  Virtual Visits   Eligibility In order to be eligible for "Virtual Visit Encounters", you must be available over the phone. It is the patient's responsibility to make sure we have a way to contact you.  We understand how people are reluctant to pickup on "unknown" calls, therefore, we suggest adding our telephone numbers to your list of "CONTACT(s)". This way, you should be able to readily identify our calls when you receive them.  When will this type of  visits be used? The decision will be made on a case by case basis.  At what time will I be called? This is an excellent question. The providers will try to call you during the day, whenever they have spare time. For the purpose of the day's schedule, you are assigned a time for your appointment, however, the call may come anytime during the day. We need you to be available on a moment's notice. If you are unable to do this, then request an "in-person" appointment rather than a "virtual visit".  Can I request my medication visits to be "Virtual"? Yes you may request it, but the decision is entirely up to the healthcare provider. Control substances require specific monitoring that requires Face-to-Face encounters. The number of encounters  and the extent of the monitoring is determined on a case by case basis.  Add a new contact to your smart phone and label it "PAIN CLINIC" Under this contact add the following numbers: Main: [phone] (Official Contact Number) Nurses: [phone] (These are outgoing only calling systems. Do not call this number.) Dr. Dossie Arbour: [phone] or [phone] (Outgoing calls only. Do not call this number.)  ____________________________________________________________________________________________   Post-procedure Information What to expect: Most procedures involve the use of a local anesthetic (numbing medicine), and a steroid (anti-inflammatory medicine).  The local anesthetics may cause temporary numbness and weakness of the legs or arms, depending on the location of the block.  This numbness/weakness may last 4-6 hours, depending on the local anesthetic used. In rare instances, it can last up to 24 hours. While numb, you must be very careful not to injure the extremity.  After any procedure, you could expect the pain to get better within 15-20 minutes. This relief is temporary and may last 4-6 hours. Once the local anesthetics wears off, you could  experience discomfort, possibly more than usual, for up to 10 (ten) days. In the case of radiofrequencies, it may last up to 6 weeks. Surgeries may take up to 8 weeks for the healing process. The discomfort is due to the irritation caused by needles going through skin and muscle. To minimize the discomfort, we recommend using ice the first day, and heat from then on. The ice should be applied for 15 minutes on, and 15 minutes off. Keep repeating this cycle until bedtime. Avoid applying the ice directly to the skin, to prevent frostbite. Heat should be used daily, until the pain improves (4-10 days). Be careful not to burn yourself.  Occasionally you may experience muscle spasms or cramps. These occur as a consequence of the irritation caused by the needle sticks to the muscle and the blood that will inevitably be lost into the surrounding muscle tissue. Blood tends to be very irritating to tissues, which tend to react by going into spasm. These spasms may start the same day of your procedure, but they may also take days to develop. This late onset type of spasm or cramp is usually caused by electrolyte imbalances triggered by the steroids, at the level of the kidney. Cramps and spasms tend to respond well to muscle relaxants, multivitamins (some are triggered by the procedure, but may have their origins in vitamin deficiencies), and "Gatorade", or any sports drinks that can replenish any electrolyte imbalances. (If you are a diabetic, ask your pharmacist to get you a sugar-free brand.) Warm showers or baths may also be helpful. Stretching exercises are highly recommended. General Instructions:  Be alert for signs of possible infection: redness, swelling, heat, red streaks, elevated temperature, and/or fever. These typically appear 4 to 6 days after the procedure. Immediately notify your doctor if you experience unusual bleeding, difficulty breathing, or loss of bowel or bladder control. If you experience  increased pain, do not increase your pain medicine intake, unless instructed by your pain physician. Post-Procedure Care:  Be careful in moving about. Muscle spasms in the area of the injection may occur. Applying ice or heat to the area is often helpful. The incidence of spinal headaches after epidural injections ranges between 1.4% and 6%. If you develop a headache that does not seem to respond to conservative therapy, please let your physician know. This can be treated with an epidural blood patch.   Post-procedure numbness or redness is to be expected, however it should average 4 to 6 hours. If numbness and weakness of your extremities begins to develop 4 to 6 hours after your procedure, and is felt to be progressing and worsening, immediately contact your physician.   Diet:  If you experience nausea, do not eat until this sensation goes away. If you had a "Stellate Ganglion Block" for upper extremity "Reflex Sympathetic Dystrophy", do not eat or drink until your hoarseness goes away. In any case, always start with liquids first and if you tolerate them well, then slowly progress to more solid foods. Activity:  For the first 4 to 6 hours after the procedure, use caution in moving about as  you may experience numbness and/or weakness. Use caution in cooking, using household electrical appliances, and climbing steps. If you need to reach your Doctor call our office: [phone]) [phone] Monday-Thursday 8:00 am - 4:00 PM    Fridays: Closed     In case of an emergency: In case of emergency, call 911 or go to the nearest emergency room and have the physician there call us.  Interpretation of Procedure Every nerve block has two components: a diagnostic component, and a treatment component. Unrealistic expectations are the most common causes of "perceived failure".  In a perfect world, a single nerve block should be able to completely and permanently eliminate the pain. Sadly, the world is not perfect.  Most  pain management nerve blocks are performed using local anesthetics and steroids. Steroids are responsible for any long-term benefit that you may experience. Their purpose is to decrease any chronic swelling that may exist in the area. Steroids begin to work immediately after being injected. However, most patients will not experience any benefits until 5 to 10 days after the injection, when the swelling has come down to the point where they can tell a difference. Steroids will only help if there is swelling to be treated. As such, they can assist with the diagnosis. If effective, they suggest an inflammatory component to the pain, and if ineffective, they rule out inflammation as the main cause or component of the problem. If the problem is one of mechanical compression, you will get no benefit from those steroids.   In the case of local anesthetics, they have a crucial role in the diagnosis of your condition. Most will begin to work within15 to 20 minutes after injection. The duration will depend on the type used (short- vs. Long-acting). It is of outmost importance that patients keep tract of their pain, after the procedure. To assist with this matter, a "Post-procedure Pain Diary" is provided. Make sure to complete it and to bring it back to your follow-up appointment.  As long as the patient keeps accurate, detailed records of their symptoms after every procedure, and returns to have those interpreted, every procedure will provide [place] with invaluable information. Even a block that does not provide the patient with any relief, will always provide [place] with information about the mechanism and the origin of the pain. The only time a nerve block can be considered a waste of time is when patients do not keep track of the results, or do not keep their post-procedure appointment.  Reporting the results back to your physician The Pain Score  Pain is a subjective complaint. It cannot be seen, touched, or measured. We  depend entirely on the patient's report of the pain in order to assess your condition and treatment. To evaluate the pain, we use a pain scale, where "0" means "No Pain", and a "10" is "the worst possible pain that you can even imagine" (i.e. something like been eaten alive by a shark or being torn apart by a lion).   You will frequently be asked to rate your pain. Please be as accurate, remember that medical decisions will be based on your responses. Please do not rate your pain above a 10. Doing so is actually interpreted as "symptom magnification" (exaggeration), as well as lack of understanding with regards to the scale. To put this into perspective, when you tell us that your pain is at a 10 (ten), what you are saying is that there is nothing we can do to make this  pain any worse. (Carefully think about that.)

## 2020-07-11 NOTE — Progress Notes (Signed)
Safety precautions to be maintained throughout the outpatient stay will include: orient to surroundings, keep bed in low position, maintain call bell within reach at all times, provide assistance with transfer out of bed and ambulation.  

## 2020-07-12 ENCOUNTER — Telehealth: Payer: Self-pay

## 2020-07-12 ENCOUNTER — Encounter: Payer: Medicare Other | Admitting: Pain Medicine

## 2020-07-12 NOTE — Telephone Encounter (Signed)
Post procedure phone call Patients daughter Arbie Cookey states she will call the patient and call us back if there are any questions oar concerns.

## 2020-07-13 ENCOUNTER — Other Ambulatory Visit: Payer: Self-pay | Admitting: Family Medicine

## 2020-07-13 DIAGNOSIS — F419 Anxiety disorder, unspecified: Secondary | ICD-10-CM

## 2020-07-13 MED ORDER — CLONAZEPAM 0.5 MG PO TABS: 0.2500 mg | ORAL_TABLET | Freq: Two times a day (BID) | ORAL | 2 refills | Status: DC
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
## 2020-07-13 NOTE — Telephone Encounter (Signed)
clonazePAM (KLONOPIN) 0.5 MG tablet 30 tablet 2 04/14/2020    Sig: TAKE 1/2 TABLET BY MOUTH TWICE DAILY   Sent to pharmacy as: clonazePAM (KLONOPIN) 0.5 MG tablet   E-Prescribing Status: Receipt confirmed by pharmacy (04/14/2020 6:53 PM EDT)    [place], [place]. Phone:  [phone]  Fax:  [phone]     Missed in last month refills only 4 left

## 2020-07-24 ENCOUNTER — Telehealth: Payer: Self-pay

## 2020-07-24 NOTE — Telephone Encounter (Signed)
Attempted to call patients daughter, no answer left messasage  to call our office back so we could review her Procedure from 07/11/2020

## 2020-07-25 ENCOUNTER — Ambulatory Visit: Payer: Medicare Other | Attending: Pain Medicine | Admitting: Pain Medicine

## 2020-07-25 ENCOUNTER — Other Ambulatory Visit: Payer: Self-pay

## 2020-07-25 DIAGNOSIS — M62830 Muscle spasm of back: Secondary | ICD-10-CM | POA: Diagnosis not present

## 2020-07-25 DIAGNOSIS — M5137 Other intervertebral disc degeneration, lumbosacral region: Secondary | ICD-10-CM

## 2020-07-25 DIAGNOSIS — M545 Low back pain, unspecified: Secondary | ICD-10-CM

## 2020-07-25 DIAGNOSIS — G894 Chronic pain syndrome: Secondary | ICD-10-CM | POA: Diagnosis not present

## 2020-07-25 DIAGNOSIS — M4317 Spondylolisthesis, lumbosacral region: Secondary | ICD-10-CM | POA: Diagnosis not present

## 2020-07-25 DIAGNOSIS — S22080S Wedge compression fracture of T11-T12 vertebra, sequela: Secondary | ICD-10-CM | POA: Diagnosis not present

## 2020-07-25 DIAGNOSIS — G8929 Other chronic pain: Secondary | ICD-10-CM

## 2020-07-25 DIAGNOSIS — M5135 Other intervertebral disc degeneration, thoracolumbar region: Secondary | ICD-10-CM

## 2020-07-25 DIAGNOSIS — M48062 Spinal stenosis, lumbar region with neurogenic claudication: Secondary | ICD-10-CM | POA: Diagnosis not present

## 2020-07-25 NOTE — Telephone Encounter (Signed)
Trying to close the encounter.

## 2020-07-25 NOTE — Progress Notes (Signed)
Patient: Brittney Simpson  Service Category: E/M  Provider: Gaspar Cola, MD  DOB: [birth year]  DOS: 07/25/2020  Location: Office  MRN: [id number]  Setting: Ambulatory outpatient  Referring Provider: Nobie Putnam *  Type: Established Patient  Specialty: Interventional Pain Management  PCP: Olin Hauser, DO  Location: Remote location  Delivery: TeleHealth     Virtual Encounter - Pain Management PROVIDER NOTE: Information contained herein reflects review and annotations entered in association with encounter. Interpretation of such information and data should be left to medically-trained personnel. Information provided to patient can be located elsewhere in the medical record under "Patient Instructions". Document created using STT-dictation technology, any transcriptional errors that may result from process are unintentional.    Contact & Pharmacy Preferred: [facility]: [phone] (home) Mobile: [phone] (mobile) E-mail: No e-mail address on record  [facility], [address] [place] [address] Phone: [phone] Fax: [phone]   Pre-screening  Brittney Simpson offered "in-person" vs "virtual" encounter. She indicated preferring virtual for this encounter.   Reason COVID-19*  Social distancing based on CDC and AMA recommendations.   I contacted Brittney Simpson on 07/25/2020 via telephone.      I clearly identified myself as Gaspar Cola, MD. I verified that I was speaking with the correct person using two identifiers (Name: Brittney Simpson, and date of birth: 10-10-1927).  Consent I sought verbal advanced consent from Brittney Simpson for virtual visit interactions. I informed Brittney Simpson of possible security and privacy concerns, risks, and limitations associated with providing "not-in-person" medical evaluation and management services. I also informed Brittney Simpson of the availability of  "in-person" appointments. Finally, I informed her that there would be a charge for the virtual visit and that she could be  personally, fully or partially, financially responsible for it. Ms. Boissonneault expressed understanding and agreed to proceed.   Historic Elements   Brittney Simpson is a 86 y.o. year old, female patient evaluated today after our last contact on 07/11/2020. Brittney Simpson  has a past medical history of Anxiety, Back ache, Hiatal hernia, Hyperlipidemia, Hypertension, Hypothyroid, Osteopenia, Thyroid disease, and UTI (urinary tract infection). She also  has a past surgical history that includes Hernia repair; Hernia repair; Hemorrhoid surgery; Kyphoplasty (N/A, 01/23/2017); and Kyphoplasty (N/A, 01/25/2020). Brittney Simpson has a current medication list which includes the following prescription(s): acetaminophen, amlodipine, clonazepam, cranberry, vitamin d2, furosemide, latanoprost, levothyroxine, lisinopril, melatonin, methocarbamol, metoprolol succinate, omeprazole, simethicone, sulfamethoxazole-trimethoprim, tramadol, and trazodone. She  reports that she has never smoked. She has never used smokeless tobacco. She reports that she does not drink alcohol and does not use drugs. Brittney Simpson has No Known Allergies.   HPI  Today, she is being contacted for a post-procedure assessment.  Because the patient is hard of hearing, her daughter does assist this with the communication of these virtual visits.  They indicate that this last injection did not seem to provide her with much relief as the last 1 and I reminded them that they need to make sure that they do not wait too long to ask for help because this can actually lead to further inflammation in the area and then he may take more than 1 injection to get a better.  They asked me how often she could have this injection and I told him that in an ideal world I would prefer to avoid doing them any sooner than every other month, but then  again it  all depends on what is going on at that time.  However, I did take time to remind them that she needs to keep up with the osteoporosis treatment by her primary care physician so that we avoid getting any more vertebral body fractures they understood and accepted.  They indicated that if the pain continues to worsen, they may call me in the next couple weeks to do a second injection.  I believe that this would be fine.  Post-Procedure Evaluation  Procedure (07/11/2020): Therapeutic left T12-L1 LESI #2 under fluoroscopic guidance no sedation Pre-procedure pain level: 8/10 Post-procedure: 0/10 (100% relief)  Sedation: None.  Effectiveness during initial hour after procedure(Ultra-Short Term Relief): 100 %.  Local anesthetic used: Long-acting (4-6 hours) Effectiveness: Defined as any analgesic benefit obtained secondary to the administration of local anesthetics. This carries significant diagnostic value as to the etiological location, or anatomical origin, of the pain. Duration of benefit is expected to coincide with the duration of the local anesthetic used.  Effectiveness during initial 4-6 hours after procedure(Short-Term Relief): 100 %.  Long-term benefit: Defined as any relief past the pharmacologic duration of the local anesthetics.  Effectiveness past the initial 6 hours after procedure(Long-Term Relief): 100 %.  Current benefits: Defined as benefit that persist at this time.   Analgesia:  90-100% better Function: Brittney Simpson reports improvement in function ROM: Brittney Simpson reports improvement in ROM  Pharmacotherapy Assessment  Analgesic: Tramadol 50 mg, 1 tab PO q 8 hrs (150 mg/day of tramadol) MME/day: 15 mg/day.   Monitoring: [place] PMP: PDMP reviewed during this encounter.       Pharmacotherapy: No side-effects or adverse reactions reported. Compliance: No problems identified. Effectiveness: Clinically acceptable. Plan: Refer to "POC".  UDS:  Summary  Date Value Ref Range  Status  10/28/2017 FINAL  Final    Comment:    ==================================================================== TOXASSURE SELECT 13 (MW) ==================================================================== Test                             Result       Flag       Units Drug Present and Declared for Prescription Verification   Tramadol                       >20000       EXPECTED   ng/mg creat   O-Desmethyltramadol            >20000       EXPECTED   ng/mg creat   N-Desmethyltramadol            8244         EXPECTED   ng/mg creat    Source of tramadol is a prescription medication.    O-desmethyltramadol and N-desmethyltramadol are expected    metabolites of tramadol. Drug Present not Declared for Prescription Verification   7-aminoclonazepam              248          UNEXPECTED ng/mg creat    7-aminoclonazepam is an expected metabolite of clonazepam. Source    of clonazepam is a scheduled prescription medication. ==================================================================== Test                      Result    Flag   Units      Ref Range   Creatinine  25               mg/dL      >=20 ==================================================================== Declared Medications:  The flagging and interpretation on this report are based on the  following declared medications.  Unexpected results may arise from  inaccuracies in the declared medications.  **Note: The testing scope of this panel includes these medications:  Tramadol  **Note: The testing scope of this panel does not include following  reported medications:  Acetaminophen  Amlodipine  Brimonidine Tartrate  Cranberry Extract  Furosemide  Latanoprost  Levothyroxine  Lidocaine  Lisinopril  Metoprolol  Omeprazole  Timolol  Trazodone  Vitamin D2 ==================================================================== For clinical consultation, please call [phone]. ====================================================================     Laboratory Chemistry Profile   Renal Lab Results  Component Value Date   BUN 8 05/16/2020   CREATININE 0.79 46/96/2952   BCR NOT APPLICABLE 84/13/2440   GFRAA 76 05/16/2020   GFRNONAA 66 05/16/2020     Hepatic Lab Results  Component Value Date   AST 13 05/16/2020   ALT 9 05/16/2020   ALBUMIN 3.6 11/22/2016   ALKPHOS 59 11/22/2016   LIPASE 21 06/14/2016     Electrolytes Lab Results  Component Value Date   NA 136 05/16/2020   K 4.3 05/16/2020   CL 98 05/16/2020   CALCIUM 9.7 05/16/2020   MG 2.1 01/14/2014     Bone No results found for: VD25OH, VD125OH2TOT, NU2725DG6, YQ0347QQ5, 25OHVITD1, 25OHVITD2, 25OHVITD3, TESTOFREE, TESTOSTERONE   Inflammation (CRP: Acute Phase) (ESR: Chronic Phase) No results found for: CRP, ESRSEDRATE, LATICACIDVEN     Note: Above Lab results reviewed.  Imaging  DG PAIN CLINIC C-ARM 1-60 MIN NO REPORT Fluoro was used, but no Radiologist interpretation will be provided.  Please refer to "NOTES" tab for provider progress note.  Assessment  The primary encounter diagnosis was Chronic low back pain (Primary Area of Pain) (Bilateral) (R>L). Diagnoses of DDD (degenerative disc disease), thoracolumbar, Spondylolisthesis of lumbosacral region (L2-3 and L5-S1), T12 compression fracture, sequela, Spinal stenosis of lumbar region with neurogenic claudication, DDD (degenerative disc disease), lumbosacral, Chronic pain syndrome, and Spasm of back muscles were also pertinent to this visit.  Plan of Care  Problem-specific:  No problem-specific Assessment & Plan notes found for this encounter.  Brittney Simpson has a current medication list which includes the following long-term medication(s): amlodipine, clonazepam, furosemide, levothyroxine, lisinopril, methocarbamol, metoprolol succinate, omeprazole, simethicone, tramadol, and trazodone.  Pharmacotherapy  (Medications Ordered): No orders of the defined types were placed in this encounter.  Orders:  No orders of the defined types were placed in this encounter.  Follow-up plan:   No follow-ups on file.      Interventional management options:  Considering: Possible bilateral lumbar facet RFA. (Offerred several times. Pt. chooses to hold.) Diagnostic right hip joint injection Diagnostic right L3 TFESI Diagnostic right trochanteric bursa injection Diagnostic right SI joint block Diagnostic midline caudal ESI (for the tailbone pain)   Palliative PRN treatment(s): Palliative left T12-L1 LESI #2  Palliativeright lumbar facet block #12 Palliativeleft lumbar facet block #10 Palliative right L3-4 LESI      Recent Visits Date Type Provider Dept  07/11/20 Procedure visit Milinda Pointer, MD [facility]  Showing recent visits within past 90 days and meeting all other requirements Today's Visits Date Type Provider Dept  07/25/20 Telemedicine Milinda Pointer, MD [facility]  Showing today's visits and meeting all other requirements Future Appointments Date Type Provider Dept  10/09/20 Appointment Dossie Arbour,  Beatriz Chancellor, MD [facility]  Showing future appointments within next 90 days and meeting all other requirements  I discussed the assessment and treatment plan with the patient. The patient was provided an opportunity to ask questions and all were answered. The patient agreed with the plan and demonstrated an understanding of the instructions.  Patient advised to call back or seek an in-person evaluation if the symptoms or condition worsens.  Duration of encounter: 13 minutes.  Note by: Gaspar Cola, MD Date: 07/25/2020; Time: 3:54 PM

## 2020-08-11 ENCOUNTER — Other Ambulatory Visit: Payer: Self-pay | Admitting: Pain Medicine

## 2020-08-11 ENCOUNTER — Other Ambulatory Visit: Payer: Self-pay | Admitting: Family Medicine

## 2020-08-11 DIAGNOSIS — G894 Chronic pain syndrome: Secondary | ICD-10-CM

## 2020-08-11 DIAGNOSIS — E034 Atrophy of thyroid (acquired): Secondary | ICD-10-CM

## 2020-08-16 DIAGNOSIS — N390 Urinary tract infection, site not specified: Secondary | ICD-10-CM | POA: Diagnosis not present

## 2020-08-23 ENCOUNTER — Encounter: Payer: Self-pay | Admitting: Family Medicine

## 2020-08-23 ENCOUNTER — Other Ambulatory Visit: Payer: Self-pay

## 2020-08-23 ENCOUNTER — Ambulatory Visit (INDEPENDENT_AMBULATORY_CARE_PROVIDER_SITE_OTHER): Payer: Medicare Other | Admitting: Family Medicine

## 2020-08-23 VITALS — BP 162/77 | HR 79 | Ht 61.0 in | Wt 118.0 lb

## 2020-08-23 DIAGNOSIS — N390 Urinary tract infection, site not specified: Secondary | ICD-10-CM | POA: Diagnosis not present

## 2020-08-23 DIAGNOSIS — N3281 Overactive bladder: Secondary | ICD-10-CM | POA: Diagnosis not present

## 2020-08-23 MED ORDER — MIRABEGRON ER 25 MG PO TB24
25.0000 mg | ORAL_TABLET | Freq: Every day | ORAL | 2 refills | Status: DC
Start: 2020-08-23 — End: 2020-08-28

## 2020-08-23 NOTE — Progress Notes (Signed)
Subjective:    Patient ID: Brittney Simpson, female    DOB: September 11, 1927, 87 y.o.   MRN: [id number]  Brittney Simpson is a 87 y.o. female presenting on 08/23/2020 for Urinary Frequency  Here with granddaughter, Tammie   HPI  Urinary Frequency / Reduced Urine Output [facility] following her for CKD-III  She reports chronic bladder issues and now she describes urinary frequency symptoms. She is having to strain and it is affecting her back. Admits bilateral low back pain due to straining She tried OTC AZO PRN for a day or two, she was able to improve her urinary function. She is staying well hydrated overall. She drinks plenty of water, juice, coffee. History of recurrent UTI in past, however often has had culture negative urine. She has been on Bactrim low dose daily antibiotic prophylaxis, and has taken Cipro PRN in past for flare of UTI. Most recently was treated by Nephrology with Augmentin and had urinalysis, that was negative and therefore culture order was cancelled. Denies fever chills sweats nausea vomiting, hematuria  Depression screen [facility] 2/9 07/11/2020 05/03/2020 02/10/2020  Decreased Interest 0 0 0  Down, Depressed, Hopeless 0 0 0  PHQ - 2 Score 0 0 0  Altered sleeping - - -  Tired, decreased energy - - -  Change in appetite - - -  Feeling bad or failure about yourself  - - -  Trouble concentrating - - -  Moving slowly or fidgety/restless - - -  Suicidal thoughts - - -  PHQ-9 Score - - -  Difficult doing work/chores - - -  Some recent data might be hidden    Social History   Tobacco Use  . Smoking status: Never Smoker  . Smokeless tobacco: Never Used  Substance Use Topics  . Alcohol use: No    Alcohol/week: 0.0 standard drinks  . Drug use: No    Review of Systems Per HPI unless specifically indicated above     Objective:    BP (!) 162/77   Pulse 79   Ht 5\' 1"  (1.549 m)   Wt 118 lb (53.5 kg)   SpO2 96%   BMI 22.30 kg/m   Wt Readings from  Last 3 Encounters:  08/23/20 118 lb (53.5 kg)  07/11/20 119 lb (54 kg)  05/12/20 121 lb (54.9 kg)    Physical Exam Vitals and nursing note reviewed.  Constitutional:      General: She is not in acute distress.    Appearance: She is well-developed. She is not diaphoretic.     Comments: Well-appearing thin elderly 87 year old female, comfortable, cooperative  HENT:     Head: Normocephalic and atraumatic.     Comments: Hard of hearing but able to follow conversation Eyes:     General:        Right eye: No discharge.        Left eye: No discharge.     Conjunctiva/sclera: Conjunctivae normal.  Neck:     Thyroid: No thyromegaly.  Cardiovascular:     Rate and Rhythm: Normal rate and regular rhythm.     Heart sounds: Normal heart sounds. No murmur heard.   Pulmonary:     Effort: Pulmonary effort is normal. No respiratory distress.     Breath sounds: Normal breath sounds. No wheezing or rales.  Abdominal:     General: Bowel sounds are normal. There is no distension.     Palpations: Abdomen is soft.     Tenderness: There  is no abdominal tenderness.  Musculoskeletal:        General: Normal range of motion.     Cervical back: Normal range of motion and neck supple.     Right lower leg: Edema (trace, non pitting) present.     Left lower leg: Edema (trace non pitting) present.  Lymphadenopathy:     Cervical: No cervical adenopathy.  Skin:    General: Skin is warm and dry.     Findings: No erythema or rash.  Neurological:     Mental Status: She is alert and oriented to person, place, and time.  Psychiatric:        Behavior: Behavior normal.     Comments: Well groomed, good eye contact, normal speech and thoughts    Results for orders placed or performed in visit on 05/03/20  Lipid panel  Result Value Ref Range   Cholesterol 256 (H) <200 mg/dL   HDL 40 (L) > OR = 50 mg/dL   Triglycerides 176 (H) <150 mg/dL   LDL Cholesterol (Calc) 183 (H) mg/dL (calc)   Total CHOL/HDL Ratio  6.4 (H) <5.0 (calc)   Non-HDL Cholesterol (Calc) 216 (H) <130 mg/dL (calc)  COMPLETE METABOLIC PANEL WITH GFR  Result Value Ref Range   Glucose, Bld 101 (H) 65 - 99 mg/dL   BUN 8 7 - 25 mg/dL   Creat 0.79 0.60 - 0.88 mg/dL   GFR, Est Non African American 66 > OR = 60 mL/min/1.70m2   GFR, Est African American 76 > OR = 60 mL/min/1.59m2   BUN/Creatinine Ratio NOT APPLICABLE 6 - 22 (calc)   Sodium 136 135 - 146 mmol/L   Potassium 4.3 3.5 - 5.3 mmol/L   Chloride 98 98 - 110 mmol/L   CO2 28 20 - 32 mmol/L   Calcium 9.7 8.6 - 10.4 mg/dL   Total Protein 6.7 6.1 - 8.1 g/dL   Albumin 3.9 3.6 - 5.1 g/dL   Globulin 2.8 1.9 - 3.7 g/dL (calc)   AG Ratio 1.4 1.0 - 2.5 (calc)   Total Bilirubin 0.4 0.2 - 1.2 mg/dL   Alkaline phosphatase (APISO) 70 37 - 153 U/L   AST 13 10 - 35 U/L   ALT 9 6 - 29 U/L  CBC with Differential/Platelet  Result Value Ref Range   WBC 4.8 3.8 - 10.8 Thousand/uL   RBC 4.64 3.80 - 5.10 Million/uL   Hemoglobin 12.7 11.7 - 15.5 g/dL   HCT 39.7 35.0 - 45.0 %   MCV 85.6 80.0 - 100.0 fL   MCH 27.4 27.0 - 33.0 pg   MCHC 32.0 32.0 - 36.0 g/dL   RDW 13.1 11.0 - 15.0 %   Platelets 236 140 - 400 Thousand/uL   MPV 10.2 7.5 - 12.5 fL   Neutro Abs 2,357 1,500 - 7,800 cells/uL   Lymphs Abs 1,891 850 - 3,900 cells/uL   Absolute Monocytes 523 200 - 950 cells/uL   Eosinophils Absolute 10 (L) 15 - 500 cells/uL   Basophils Absolute 19 0 - 200 cells/uL   Neutrophils Relative % 49.1 %   Total Lymphocyte 39.4 %   Monocytes Relative 10.9 %   Eosinophils Relative 0.2 %   Basophils Relative 0.4 %  Hemoglobin A1c  Result Value Ref Range   Hgb A1c MFr Bld 5.2 <5.7 % of total Hgb   Mean Plasma Glucose 103 (calc)   eAG (mmol/L) 5.7 (calc)  T4, free  Result Value Ref Range   Free T4 1.7 0.8 - 1.8  ng/dL  TSH  Result Value Ref Range   TSH 0.23 (L) 0.40 - 4.50 mIU/L   Copy of last Urinalysis done 08/16/20  MISC LAB TEST [facility] 08/16/2020                        URINALYSIS [facility] 08/16/2020 Component    pH Urine  Glucose, Ur  Hemoglobin Ur Ql Strip  Calcium Oxalate Crystals, Urine  Crystals  Casts  Color, Urine  Appearance Urine  Specific Gravity, UA  Bilirubin, Urine  Ketones, Urine  RBC, Urine  Trans Epithelial, Urine  Bacteria  Triple Phosphate Crystals, Urine  Hyaline Casts, Urine  Granular Casts, Urine  Yeast, UA  Nitrite, Urine  Renal Epithelial Cells, Urine  Uric Acid Crystals, Urine  Amorphous Sediments  Comments  Protein, Ur  WBC Esterase Urine  WBC, Urine  Epithelial Cells in Urine   Component 08/16/2020 04/26/2020 07/28/2019 01/18/2019 05/13/2018         pH Urine 6.5 6.5 5.5 7.5 7.0  Glucose, Ur NEGATIVE NEGATIVE NEGATIVE NEGATIVE NEGATIVE  Hemoglobin Ur Ql Strip NEGATIVE NEGATIVE NEGATIVE NEGATIVE NEGATIVE  Calcium Oxalate Crystals, Urine CANCELED CANCELED CANCELED CANCELED CANCELED  Crystals CANCELED CANCELED CANCELED CANCELED CANCELED  Casts CANCELED CANCELED CANCELED CANCELED CANCELED  Color, Urine YELLOW YELLOW YELLOW YELLOW YELLOW  Appearance Urine CLEAR CLEAR CLOUDYAbnormal   CLEAR CLEAR  Specific Gravity, UA 1.005 1.005 1.010 1.004 1.009  Bilirubin, Urine NEGATIVE NEGATIVE NEGATIVE NEGATIVE NEGATIVE  Ketones, Urine NEGATIVE NEGATIVE NEGATIVE NEGATIVE NEGATIVE  RBC, Urine NONE SEEN NONE SEEN NONE SEEN NONE SEEN NONE SEEN  Trans Epithelial, Urine CANCELED CANCELED CANCELED CANCELED CANCELED  Bacteria NONE SEEN NONE SEEN NONE SEEN NONE SEEN NONE SEEN  Triple Phosphate Crystals, Urine CANCELED CANCELED CANCELED CANCELED CANCELED  Hyaline Casts, Urine NONE SEEN NONE SEEN NONE SEEN NONE SEEN NONE SEEN  Granular Casts, Urine CANCELED CANCELED CANCELED CANCELED CANCELED  Yeast, UA CANCELED CANCELED CANCELED CANCELED CANCELED  Nitrite, Urine NEGATIVE NEGATIVE NEGATIVE NEGATIVE NEGATIVE  Renal Epithelial Cells, Urine CANCELED CANCELED CANCELED CANCELED CANCELED  Uric Acid Crystals, Urine  CANCELED CANCELED CANCELED CANCELED CANCELED  Amorphous Sediments CANCELED CANCELED CANCELED CANCELED CANCELED  Comments CANCELED CANCELED CANCELED CANCELED CANCELED  Protein, Ur NEGATIVE NEGATIVE NEGATIVE NEGATIVE NEGATIVE  WBC Esterase Urine NEGATIVE NEGATIVE NEGATIVE NEGATIVE 2+Abnormal    WBC, Urine NONE SEEN NONE SEEN NONE SEEN NONE SEEN 20-40Abnormal    Epithelial Cells in Urine NONE SEEN NONE SEEN NONE SEEN         Assessment & Plan:   Problem List Items Addressed This Visit    Recurrent urinary tract infection    Other Visit Diagnoses    OAB (overactive bladder)    -  Primary   Relevant Medications   mirabegron ER (MYRBETRIQ) 25 MG TB24 tablet   Other Relevant Orders   Ambulatory referral to Urology      Urinary frequency Pattern most consistent with OAB dysfunctional voiding symptoms History of recurrent UTI but clinically not consistent today  Last urinalysis done by [facility], results above 08/16/20 - deferred repeat urine today, they ordered urine culture but it was cancelled since negative UA  Discussion that recent antibiotics may not solve the problem she may need treatment / diagnostics for OAB and bladder issue.  Referral to Urology for OAB concerns with urinary frequency and history of recurrent UTI. She has CKD and has seen Nephrology and is on antibiotic prophylaxis for UTI with Bactrim and has taken  Cipro PRN, but still having OAB symptoms,  Offer Myrbetriq given difficulty with taking some med due to side effects, however concern at risk of high cost if not covered.  Will defer to uro if this med is too costly, hold off on other OAB agents until urology can evaluate.   Orders Placed This Encounter  Procedures  . Ambulatory referral to Urology    Referral Priority:   Routine    Referral Type:   Consultation    Referral Reason:   Specialty Services Required    Requested Specialty:   Urology    Number of Visits Requested:   1     Meds ordered  this encounter  Medications  . mirabegron ER (MYRBETRIQ) 25 MG TB24 tablet    Sig: Take 1 tablet (25 mg total) by mouth daily.    Dispense:  30 tablet    Refill:  2      Follow up plan: Return if symptoms worsen or fail to improve.   Nobie Putnam, [facility] 08/23/2020, 2:41 PM

## 2020-08-23 NOTE — Patient Instructions (Addendum)
Thank you for coming to the office today.  [facility] -[address] floor [place],  [place]  [address] Phone: [phone]  Likely overactive bladder  Unlikely to be infection  Keep on Bactrim daily  Trial myrbetriq for overactive bladder, best med and limited side effect but unfortunately high cost, hope insurance covers it or else Urologist can help with this in future.   Please schedule a Follow-up Appointment to: Return if symptoms worsen or fail to improve.  If you have any other questions or concerns, please feel free to call the office or send a message through [place]. You may also schedule an earlier appointment if necessary.  Additionally, you may be receiving a survey about your experience at our office within a few days to 1 week by e-mail or mail. We value your feedback.  Nobie Putnam, DO [place]

## 2020-08-25 ENCOUNTER — Telehealth: Payer: Self-pay

## 2020-08-25 NOTE — Telephone Encounter (Signed)
This is actually a duplicate request. This is not constipation, she was describing straining to urinate.  I saw her in office on 08/23/20 for this issue.  I prescribed Myrbetriq (however I am not sure if this is covered by her insurance)  She was also referred to [facility]. Her apt is 08/28/20.  Unfortunately there is not much else I can do to treat this problem. She can see the Urologist next.  Nobie Putnam, [facility] 08/25/2020, 9:24 AM

## 2020-08-25 NOTE — Telephone Encounter (Signed)
Copied from [place] [phone]. Topic: General - Other >> Aug 24, 2020  3:21 PM Leonides Schanz, [place] wrote: Reason for CRM: Pt stated she is drinking lots of liquid as she was told but she is still having an issue with going to the restroom. Pt stated she has to strain when she goes but she is only able to release a few drops. Pt requests call back to advise of other options to resolve the problem. Cb# [phone]

## 2020-08-28 ENCOUNTER — Ambulatory Visit (INDEPENDENT_AMBULATORY_CARE_PROVIDER_SITE_OTHER): Payer: Medicare Other | Admitting: Urology

## 2020-08-28 ENCOUNTER — Encounter: Payer: Self-pay | Admitting: Urology

## 2020-08-28 ENCOUNTER — Other Ambulatory Visit: Payer: Self-pay

## 2020-08-28 VITALS — BP 182/81 | HR 60 | Ht 61.0 in

## 2020-08-28 DIAGNOSIS — N3281 Overactive bladder: Secondary | ICD-10-CM

## 2020-08-28 DIAGNOSIS — R339 Retention of urine, unspecified: Secondary | ICD-10-CM | POA: Diagnosis not present

## 2020-08-28 LAB — URINALYSIS, COMPLETE
Bilirubin, UA: NEGATIVE
Glucose, UA: NEGATIVE
Ketones, UA: NEGATIVE
Leukocytes,UA: NEGATIVE
Nitrite, UA: NEGATIVE
Protein,UA: NEGATIVE
RBC, UA: NEGATIVE
Specific Gravity, UA: 1.015 (ref 1.005–1.030)
Urobilinogen, Ur: 0.2 mg/dL (ref 0.2–1.0)
pH, UA: 7 (ref 5.0–7.5)

## 2020-08-28 LAB — MICROSCOPIC EXAMINATION
Bacteria, UA: NONE SEEN
Epithelial Cells (non renal): NONE SEEN /hpf (ref 0–10)
WBC, UA: NONE SEEN /hpf (ref 0–5)

## 2020-08-28 MED ORDER — TAMSULOSIN HCL 0.4 MG PO CAPS: 0.4000 mg | ORAL_CAPSULE | Freq: Every day | ORAL | 11 refills | Status: DC

## 2020-08-28 NOTE — Patient Instructions (Signed)
Cystoscopy Cystoscopy is a procedure that is used to help diagnose and sometimes treat conditions that affect the lower urinary tract. The lower urinary tract includes the bladder and the urethra. The urethra is the tube that drains urine from the bladder. Cystoscopy is done using a thin, tube-shaped instrument with a light and camera at the end (cystoscope). The cystoscope may be hard or flexible, depending on the goal of the procedure. The cystoscope is inserted through the urethra, into the bladder. Cystoscopy may be recommended if you have:  Urinary tract infections that keep coming back.  Blood in the urine (hematuria).  An inability to control when you urinate (urinary incontinence) or an overactive bladder.  Unusual cells found in a urine sample.  A blockage in the urethra, such as a urinary stone.  Painful urination.  An abnormality in the bladder found during an intravenous pyelogram (IVP) or CT scan. Cystoscopy may also be done to remove a sample of tissue to be examined under a microscope (biopsy). What are the risks? Generally, this is a safe procedure. However, problems may occur, including:  Infection.  Bleeding.  What happens during the procedure?  1. You will be given one or more of the following: ? A medicine to numb the area (local anesthetic). 2. The area around the opening of your urethra will be cleaned. 3. The cystoscope will be passed through your urethra into your bladder. 4. Germ-free (sterile) fluid will flow through the cystoscope to fill your bladder. The fluid will stretch your bladder so that your health care provider can clearly examine your bladder walls. 5. Your doctor will look at the urethra and bladder. 6. The cystoscope will be removed The procedure may vary among health care providers  What can I expect after the procedure? After the procedure, it is common to have: 1. Some soreness or pain in your abdomen and urethra. 2. Urinary symptoms.  These include: ? Mild pain or burning when you urinate. Pain should stop within a few minutes after you urinate. This may last for up to 1 week. ? A small amount of blood in your urine for several days. ? Feeling like you need to urinate but producing only a small amount of urine. Follow these instructions at home: General instructions  Return to your normal activities as told by your health care provider.   Do not drive for 24 hours if you were given a sedative during your procedure.  Watch for any blood in your urine. If the amount of blood in your urine increases, call your health care provider.  If a tissue sample was removed for testing (biopsy) during your procedure, it is up to you to get your test results. Ask your health care provider, or the department that is doing the test, when your results will be ready.  Drink enough fluid to keep your urine pale yellow.  Keep all follow-up visits as told by your health care provider. This is important. Contact a health care provider if you:  Have pain that gets worse or does not get better with medicine, especially pain when you urinate.  Have trouble urinating.  Have more blood in your urine. Get help right away if you:  Have blood clots in your urine.  Have abdominal pain.  Have a fever or chills.  Are unable to urinate. Summary  Cystoscopy is a procedure that is used to help diagnose and sometimes treat conditions that affect the lower urinary tract.  Cystoscopy is done using   a thin, tube-shaped instrument with a light and camera at the end.  After the procedure, it is common to have some soreness or pain in your abdomen and urethra.  Watch for any blood in your urine. If the amount of blood in your urine increases, call your health care provider.  If you were prescribed an antibiotic medicine, take it as told by your health care provider. Do not stop taking the antibiotic even if you start to feel better. This  information is not intended to replace advice given to you by your health care provider. Make sure you discuss any questions you have with your health care provider. Document Revised: 07/07/2018 Document Reviewed: 07/07/2018 Elsevier Patient Education  2020 [employer]   

## 2020-08-28 NOTE — Progress Notes (Signed)
08/28/2020 8:47 AM   Brittney Simpson 09-02-1927 [id number]  Referring provider: Olin Hauser, DO [address] [place],  [place] [address]  Chief Complaint  Patient presents with  . Over Active Bladder    HPI: Consulted to assess the patient for possible bladder infection and incontinence.  History was limited and patient's daughter was helpful.  Patient is hard of hearing.  It appeared she was wearing 2 depends a day in the last 2 weeks she is not leaking but has trouble to go.  She will feel an urge and then she cannot go.  She voids 4-5 times a day.  She will then double void and strain.  She says she is not getting up at night to urinate     PMH: Past Medical History:  Diagnosis Date  . Anxiety   . Back ache   . Hiatal hernia   . Hyperlipidemia   . Hypertension   . Hypothyroid   . Osteopenia   . Thyroid disease   . UTI (urinary tract infection)     Surgical History: Past Surgical History:  Procedure Laterality Date  . HEMORRHOID SURGERY    . HERNIA REPAIR    . HERNIA REPAIR    . KYPHOPLASTY N/A 01/23/2017   Procedure: [id number];  Surgeon: Hessie Knows, MD;  Location: [facility] ORS;  Service: Orthopedics;  Laterality: N/A;  . KYPHOPLASTY N/A 01/25/2020   Procedure: L4 KYPHOPLASTY;  Surgeon: Hessie Knows, MD;  Location: [facility] ORS;  Service: Orthopedics;  Laterality: N/A;    Home Medications:  Allergies as of 08/28/2020   No Known Allergies     Medication List       Accurate as of August 28, 2020  8:47 AM. If you have any questions, ask your nurse or doctor.        STOP taking these medications   mirabegron ER 25 MG Tb24 tablet Commonly known as: MYRBETRIQ Stopped by: Reece Packer, MD   simethicone 80 MG chewable tablet Commonly known as: MYLICON Stopped by: Reece Packer, MD     TAKE these medications   acetaminophen 500 MG tablet Commonly known as: TYLENOL Take 1,000 mg by mouth daily as needed for moderate pain or  headache.   amLODipine 5 MG tablet Commonly known as: NORVASC Take 1 tablet (5 mg total) by mouth daily.   clonazePAM 0.5 MG tablet Commonly known as: KLONOPIN Take 0.5 tablets (0.25 mg total) by mouth 2 (two) times daily.   Cranberry 500 MG Caps Take 500 mg by mouth daily.   furosemide 20 MG tablet Commonly known as: LASIX Take 1.5 tablets (30 mg total) by mouth daily.   latanoprost 0.005 % ophthalmic solution Commonly known as: XALATAN Place 1 drop into both eyes at bedtime.   levothyroxine 112 MCG tablet Commonly known as: SYNTHROID TAKE 1 TABLET BY MOUTH ONCE DAILY ON AN EMPTY STOMACH. WAIT 30 MINS BEFORE TAKING OTHER MEDS.   lisinopril 40 MG tablet Commonly known as: ZESTRIL Take 1 tablet (40 mg total) by mouth daily.   Melatonin 10 MG Tabs Take 10 mg by mouth at bedtime.   metoprolol succinate 100 MG 24 hr tablet Commonly known as: TOPROL-XL TAKE 1 TABLET BY MOUTH ONCE DAILY WITH FOOD   omeprazole 20 MG capsule Commonly known as: PRILOSEC Take 1 capsule (20 mg total) by mouth daily.   sulfamethoxazole-trimethoprim 400-80 MG tablet Commonly known as: BACTRIM Take 1 tablet by mouth daily.   traMADol 50 MG tablet Commonly  known as: ULTRAM Take 1 tablet (50 mg total) by mouth 3 (three) times daily.   traZODone 50 MG tablet Commonly known as: DESYREL Take 1 tablet (50 mg total) by mouth at bedtime.   Vitamin D2 50 MCG (2000 UT) Tabs Take 2,000 Units by mouth daily with lunch.       Allergies: No Known Allergies  Family History: Family History  Problem Relation Age of Onset  . Stroke Mother   . Cancer Father     Social History:  reports that she has never smoked. She has never used smokeless tobacco. She reports that she does not drink alcohol and does not use drugs.  ROS:                                        Physical Exam: BP (!) 182/81   Pulse 60   Ht 5\' 1"  (1.549 m)   BMI 22.30 kg/m   Constitutional:  Alert and  oriented, No acute distress. HEENT: [place] AT, moist mucus membranes.  Trachea midline, no masses. Cardiovascular: No clubbing, cyanosis, or edema. Respiratory: Normal respiratory effort, no increased work of breathing. GI: Abdomen is soft, nontender, nondistended, no abdominal masses GU: No CVA tenderness.  No distress Skin: No rashes, bruises or suspicious lesions. Lymph: No cervical or inguinal adenopathy. Neurologic: Grossly intact, no focal deficits, moving all 4 extremities. Psychiatric: Normal mood and affect.  Laboratory Data: Lab Results  Component Value Date   WBC 4.8 05/16/2020   HGB 12.7 05/16/2020   HCT 39.7 05/16/2020   MCV 85.6 05/16/2020   PLT 236 05/16/2020    Lab Results  Component Value Date   CREATININE 0.79 05/16/2020    No results found for: PSA  No results found for: TESTOSTERONE  Lab Results  Component Value Date   HGBA1C 5.2 05/16/2020    Urinalysis    Component Value Date/Time   COLORURINE COLORLESS (A) 06/14/2016 0906   APPEARANCEUR CLEAR (A) 06/14/2016 0906   APPEARANCEUR Clear 07/08/2014 0907   LABSPEC 1.003 (L) 06/14/2016 0906   LABSPEC 1.003 07/08/2014 0907   PHURINE 7.0 06/14/2016 0906   GLUCOSEU NEGATIVE 06/14/2016 0906   GLUCOSEU Negative 07/08/2014 0907   HGBUR NEGATIVE 06/14/2016 0906   BILIRUBINUR NEGATIVE 06/14/2016 0906   BILIRUBINUR Negative 07/08/2014 0907   KETONESUR NEGATIVE 06/14/2016 0906   PROTEINUR NEGATIVE 06/14/2016 0906   NITRITE NEGATIVE 06/14/2016 0906   LEUKOCYTESUR NEGATIVE 06/14/2016 0906   LEUKOCYTESUR Negative 07/08/2014 0907    Pertinent Imaging: Urine reviewed.  Chart reviewed.  Urine sent for culture  Assessment & Plan: Patient likely having obstructive voiding secondary to overactive bladder.  Her referring doctor wanted to place her on mirabegron which I think is a good choice but apparently it was expensive.  Because of the obstructive component I decided to give her Flomax and perform cystoscopy in  the next 3 weeks and proceed accordingly.  1. OAB (overactive bladder)  - Urinalysis, Complete   No follow-ups on file.  Reece Packer, MD  [facility] [address], [place], [place] [address] [phone]

## 2020-08-31 LAB — CULTURE, URINE COMPREHENSIVE

## 2020-09-08 ENCOUNTER — Other Ambulatory Visit: Payer: Self-pay | Admitting: Family Medicine

## 2020-09-08 DIAGNOSIS — F419 Anxiety disorder, unspecified: Secondary | ICD-10-CM
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
## 2020-09-18 DIAGNOSIS — H401133 Primary open-angle glaucoma, bilateral, severe stage: Secondary | ICD-10-CM | POA: Diagnosis not present

## 2020-09-19 DIAGNOSIS — H401133 Primary open-angle glaucoma, bilateral, severe stage: Secondary | ICD-10-CM | POA: Diagnosis not present

## 2020-09-25 ENCOUNTER — Other Ambulatory Visit: Payer: Medicare Other | Admitting: Urology

## 2020-10-07 NOTE — Progress Notes (Signed)
PROVIDER NOTE: Information contained herein reflects review and annotations entered in association with encounter. Interpretation of such information and data should be left to medically-trained personnel. Information provided to patient can be located elsewhere in the medical record under "Patient Instructions". Document created using STT-dictation technology, any transcriptional errors that may result from process are unintentional.    Patient: Brittney Simpson  Service Category: E/M  Provider: Gaspar Cola, MD  DOB: 25-Feb-1928  DOS: 10/09/2020  Specialty: Interventional Pain Management  MRN: [id number]  Setting: Ambulatory outpatient  PCP: Olin Hauser, DO  Type: Established Patient    Referring Provider: Nobie Putnam *  Location: Office  Delivery: Face-to-face     HPI  [name], a 87 y.o. year old female, is here today because of her Chronic pain syndrome [G89.4]. Ms. Linskey primary complain today is Back Pain (Right is worse, low) Last encounter: My last encounter with her was on 08/11/2020. Pertinent problems: Ms. Prescher has Lumbar facet syndrome (Bilateral) (R>L); Chronic low back pain (Primary Area of Pain) (Bilateral) (R>L); Spondylolisthesis of lumbosacral region (L2-3 and L5-S1); Lumbar spinal stenosis (9 mm at L3-4); Trochanteric bursitis (Right); Chronic hip pain (Secondary area of Pain) (Right); Osteoarthritis of hip (Right); Chronic sacroiliac joint pain (Bilateral) (R>L); Chronic lumbar radicular pain (Right); Chronic lower extremity pain (Third area of Pain) (Right); Myofascial pain; Spondylosis without myelopathy or radiculopathy, lumbosacral region; Ankle edema, bilateral; Abnormal MRI, lumbar spine (01/10/2017); T12 compression fracture, sequela; Lumbar facet hypertrophy; Lumbar lateral recess stenosis; Lumbar foraminal stenosis; Non-traumatic compression fracture of T7 thoracic vertebra, sequela; Chronic pain syndrome; DDD (degenerative  disc disease), lumbosacral; Edema of lower extremity; Acute exacerbation of chronic low back pain; Acute mid back pain; Somatic dysfunction of sacroiliac joints (Bilateral); Other spondylosis, sacral and sacrococcygeal region; Spasm of back muscles; and DDD (degenerative disc disease), thoracolumbar on their pertinent problem list. Pain Assessment: Severity of Chronic pain is reported as a 6 /10. Location: Back Lower/denies. Onset: More than a month ago. Quality: Constant,Aching. Timing: Constant. Modifying factor(s): medications, heat , ice. Vitals:  height is '5\' 5"'  (1.651 m) and weight is 118 lb (53.5 kg). Her temporal temperature is 97.1 F (36.2 C) (abnormal). Her blood pressure is 143/82 (abnormal) and her pulse is 68. Her respiration is 18 and oxygen saturation is 98%.   Reason for encounter: medication management.   The patient indicates doing well with the current medication regimen. No adverse reactions or side effects reported to the medications.  Apparently she has been having some problems with urinary retention and she has been sent to a urologist, who apparently started her on Flomax 0.4 mg daily.  She is taking the tablet at bedtime and I suggested that perhaps she try taking it in the morning.  Today she spent a lot of time asking me urology questions and I kept redirecting her to her urologist.  RTCB: 01/07/2021  Pharmacotherapy Assessment   Analgesic: Tramadol 50 mg, 1 tab PO q 8 hrs (150 mg/day of tramadol) MME/day: 15 mg/day.   Monitoring: [place] PMP: PDMP reviewed during this encounter.       Pharmacotherapy: No side-effects or adverse reactions reported. Compliance: No problems identified. Effectiveness: Clinically acceptable.  Hart Rochester, RN  10/09/2020  9:44 AM  Sign when Signing Visit Nursing Pain Medication Assessment:  Safety precautions to be maintained throughout the outpatient stay will include: orient to surroundings, keep bed in low position, maintain call  bell within reach at all times, provide assistance with  transfer out of bed and ambulation.  Medication Inspection Compliance: Pill count conducted under aseptic conditions, in front of the patient. Neither the pills nor the bottle was removed from the patient's sight at any time. Once count was completed pills were immediately returned to the patient in their original bottle.  Medication: Tramadol (Ultram) Pill/Patch Count: 75 of 270 pills remain Pill/Patch Appearance: Markings consistent with prescribed medication Bottle Appearance: Standard pharmacy container. Clearly labeled. Filled Date: 01 / 14 / 2022 Last Medication intake:  Yesterday    UDS:  Summary  Date Value Ref Range Status  10/28/2017 FINAL  Final    Comment:    ==================================================================== TOXASSURE SELECT 13 (MW) ==================================================================== Test                             Result       Flag       Units Drug Present and Declared for Prescription Verification   Tramadol                       >20000       EXPECTED   ng/mg creat   O-Desmethyltramadol            >20000       EXPECTED   ng/mg creat   N-Desmethyltramadol            8244         EXPECTED   ng/mg creat    Source of tramadol is a prescription medication.    O-desmethyltramadol and N-desmethyltramadol are expected    metabolites of tramadol. Drug Present not Declared for Prescription Verification   7-aminoclonazepam              248          UNEXPECTED ng/mg creat    7-aminoclonazepam is an expected metabolite of clonazepam. Source    of clonazepam is a scheduled prescription medication. ==================================================================== Test                      Result    Flag   Units      Ref Range   Creatinine              25               mg/dL      >=20 ==================================================================== Declared Medications:  The flagging  and interpretation on this report are based on the  following declared medications.  Unexpected results may arise from  inaccuracies in the declared medications.  **Note: The testing scope of this panel includes these medications:  Tramadol  **Note: The testing scope of this panel does not include following  reported medications:  Acetaminophen  Amlodipine  Brimonidine Tartrate  Cranberry Extract  Furosemide  Latanoprost  Levothyroxine  Lidocaine  Lisinopril  Metoprolol  Omeprazole  Timolol  Trazodone  Vitamin D2 ==================================================================== For clinical consultation, please call [phone]. ====================================================================      ROS  Constitutional: Denies any fever or chills Gastrointestinal: No reported hemesis, hematochezia, vomiting, or acute GI distress Musculoskeletal: Denies any acute onset joint swelling, redness, loss of ROM, or weakness Neurological: No reported episodes of acute onset apraxia, aphasia, dysarthria, agnosia, amnesia, paralysis, loss of coordination, or loss of consciousness  Medication Review  Cranberry, Melatonin, Vitamin D2, acetaminophen, amLODipine, clonazePAM, furosemide, latanoprost, levothyroxine, lisinopril, metoprolol succinate, omeprazole, sulfamethoxazole-trimethoprim, tamsulosin, traMADol, and traZODone  History Review  Allergy: Ms. Rohe has No Known Allergies. Drug: Ms. Mccammon  reports no history of drug use. Alcohol:  reports no history of alcohol use. Tobacco:  reports that she has never smoked. She has never used smokeless tobacco. Social: Ms. Vara  reports that she has never smoked. She has never used smokeless tobacco. She reports that she does not drink alcohol and does not use drugs. Medical:  has a past medical history of Anxiety, Back ache, Hiatal hernia, Hyperlipidemia, Hypertension, Hypothyroid, Osteopenia, Thyroid disease, and UTI  (urinary tract infection). Surgical: [name]  has a past surgical history that includes Hernia repair; Hernia repair; Hemorrhoid surgery; Kyphoplasty (N/A, 01/23/2017); and Kyphoplasty (N/A, 01/25/2020). Family: family history includes Cancer in her father; Stroke in her mother.  Laboratory Chemistry Profile   Renal Lab Results  Component Value Date   BUN 8 05/16/2020   CREATININE 0.79 57/90/3833   BCR NOT APPLICABLE 38/32/9191   GFRAA 76 05/16/2020   GFRNONAA 66 05/16/2020     Hepatic Lab Results  Component Value Date   AST 13 05/16/2020   ALT 9 05/16/2020   ALBUMIN 3.6 11/22/2016   ALKPHOS 59 11/22/2016   LIPASE 21 06/14/2016     Electrolytes Lab Results  Component Value Date   NA 136 05/16/2020   K 4.3 05/16/2020   CL 98 05/16/2020   CALCIUM 9.7 05/16/2020   MG 2.1 01/14/2014     Bone No results found for: VD25OH, VD125OH2TOT, YO0600KH9, XH7414EL9, 25OHVITD1, 25OHVITD2, 25OHVITD3, TESTOFREE, TESTOSTERONE   Inflammation (CRP: Acute Phase) (ESR: Chronic Phase) No results found for: CRP, ESRSEDRATE, LATICACIDVEN     Note: Above Lab results reviewed.  Recent Imaging Review  DG PAIN CLINIC C-ARM 1-60 MIN NO REPORT Fluoro was used, but no Radiologist interpretation will be provided.  Please refer to "NOTES" tab for provider progress note. Note: Reviewed        Physical Exam  General appearance: Well nourished, well developed, and well hydrated. In no apparent acute distress Mental status: Alert, oriented x 3 (person, place, & time)       Respiratory: No evidence of acute respiratory distress Eyes: PERLA Vitals: BP (!) 143/82   Pulse 68   Temp (!) 97.1 F (36.2 C) (Temporal)   Resp 18   Ht '5\' 5"'  (1.651 m)   Wt 118 lb (53.5 kg)   SpO2 98%   BMI 19.64 kg/m  BMI: Estimated body mass index is 19.64 kg/m as calculated from the following:   Height as of this encounter: '5\' 5"'  (1.651 m).   Weight as of this encounter: 118 lb (53.5 kg). Ideal: Ideal body  weight: 57 kg (125 lb 10.6 oz)  Assessment   Status Diagnosis  Controlled Controlled Controlled 1. Chronic pain syndrome   2. Chronic low back pain (Primary Area of Pain) (Bilateral) (R>L)   3. Spondylolisthesis of lumbosacral region (L2-3 and L5-S1)   4. T12 compression fracture, sequela   5. Spinal stenosis of lumbar region with neurogenic claudication   6. Spasm of back muscles   7. Chronic lower extremity pain (Third area of Pain) (Right)   8. Chronic lumbar radicular pain (Right)   9. Chronic hip pain (Secondary area of Pain) (Right)   10. Pharmacologic therapy   11. Uncomplicated opioid dependence ([place])      Updated Problems: No problems updated.  Plan of Care  Problem-specific:  No problem-specific Assessment & Plan notes found for this encounter.  Ms. ANDRIEA HASEGAWA has a current medication list  which includes the following long-term medication(s): amlodipine, clonazepam, furosemide, levothyroxine, lisinopril, metoprolol succinate, omeprazole, tramadol, and trazodone.  Pharmacotherapy (Medications Ordered): Meds ordered this encounter  Medications  . traMADol (ULTRAM) 50 MG tablet    Sig: Take 1 tablet (50 mg total) by mouth 3 (three) times daily.    Dispense:  270 tablet    Refill:  0    Chronic Pain: STOP Act (Not applicable). Fill 1 day early if closed on refill date. Avoid benzodiazepines within 8 hours of opioids   Orders:  Orders Placed This Encounter  Procedures  . Lumbar Epidural Injection    Standing Status:   Future    Standing Expiration Date:   11/09/2020    Scheduling Instructions:     Procedure: Interlaminar Lumbar Epidural Steroid injection (LESI)  T12-L1     Laterality: Left-sided     Sedation: Patient's choice.     Timeframe: ASAA    Order Specific Question:   Where will this procedure be performed?    Answer:   [facility]   Follow-up plan:   Return in about 3 months (around 01/07/2021) for (F2F), (MM), in addition, Procedure  (no sedation): (L) T12-L1 LESI.      Interventional management options:  Considering: Possible bilateral lumbar facet RFA. (Offerred several times. Pt. chooses to hold.) Diagnostic right hip joint injection Diagnostic right L3 TFESI Diagnostic right trochanteric bursa injection Diagnostic right SI joint block Diagnostic midline caudal ESI (for the tailbone pain)   Palliative PRN treatment(s): Palliative left T12-L1 LESI #2  Palliativeright lumbar facet block #12 Palliativeleft lumbar facet block #10 Palliative right L3-4 LESI    Recent Visits Date Type Provider Dept  07/25/20 Telemedicine Milinda Pointer, [facility]  07/11/20 Procedure visit Milinda Pointer, MD [facility]  Showing recent visits within past 90 days and meeting all other requirements Today's Visits Date Type Provider Dept  10/09/20 Office Visit Milinda Pointer, MD [facility]  Showing today's visits and meeting all other requirements Future Appointments Date Type Provider Dept  01/01/21 Appointment Milinda Pointer, MD [facility]  Showing future appointments within next 90 days and meeting all other requirements  I discussed the assessment and treatment plan with the patient. The patient was provided an opportunity to ask questions and all were answered. The patient agreed with the plan and demonstrated an understanding of the instructions.  Patient advised to call back or seek an in-person evaluation if the symptoms or condition worsens.  Duration of encounter: 30 minutes.  Note by: Gaspar Cola, MD Date: 10/09/2020; Time: 10:43 AM

## 2020-10-09 ENCOUNTER — Other Ambulatory Visit: Payer: Self-pay

## 2020-10-09 ENCOUNTER — Encounter: Payer: Self-pay | Admitting: Pain Medicine

## 2020-10-09 ENCOUNTER — Ambulatory Visit: Payer: Medicare Other | Attending: Pain Medicine | Admitting: Pain Medicine

## 2020-10-09 VITALS — BP 143/82 | HR 68 | Temp 97.1°F | Resp 18 | Ht 65.0 in | Wt 118.0 lb

## 2020-10-09 DIAGNOSIS — M545 Low back pain, unspecified: Secondary | ICD-10-CM | POA: Diagnosis present

## 2020-10-09 DIAGNOSIS — M79604 Pain in right leg: Secondary | ICD-10-CM | POA: Diagnosis present

## 2020-10-09 DIAGNOSIS — M5416 Radiculopathy, lumbar region: Secondary | ICD-10-CM | POA: Insufficient documentation

## 2020-10-09 DIAGNOSIS — S22080S Wedge compression fracture of T11-T12 vertebra, sequela: Secondary | ICD-10-CM | POA: Diagnosis present

## 2020-10-09 DIAGNOSIS — G8929 Other chronic pain: Secondary | ICD-10-CM | POA: Insufficient documentation

## 2020-10-09 DIAGNOSIS — M48062 Spinal stenosis, lumbar region with neurogenic claudication: Secondary | ICD-10-CM | POA: Diagnosis present

## 2020-10-09 DIAGNOSIS — M62830 Muscle spasm of back: Secondary | ICD-10-CM | POA: Insufficient documentation

## 2020-10-09 DIAGNOSIS — M25551 Pain in right hip: Secondary | ICD-10-CM | POA: Diagnosis present

## 2020-10-09 DIAGNOSIS — M4317 Spondylolisthesis, lumbosacral region: Secondary | ICD-10-CM

## 2020-10-09 DIAGNOSIS — G894 Chronic pain syndrome: Secondary | ICD-10-CM | POA: Diagnosis present

## 2020-10-09 DIAGNOSIS — Z79899 Other long term (current) drug therapy: Secondary | ICD-10-CM | POA: Diagnosis present

## 2020-10-09 DIAGNOSIS — F1120 Opioid dependence, uncomplicated: Secondary | ICD-10-CM | POA: Insufficient documentation

## 2020-10-09 MED ORDER — TRAMADOL HCL 50 MG PO TABS: 50.0000 mg | ORAL_TABLET | Freq: Three times a day (TID) | ORAL | 0 refills | Status: DC

## 2020-10-09 NOTE — Progress Notes (Signed)
Nursing Pain Medication Assessment:  Safety precautions to be maintained throughout the outpatient stay will include: orient to surroundings, keep bed in low position, maintain call bell within reach at all times, provide assistance with transfer out of bed and ambulation.  Medication Inspection Compliance: Pill count conducted under aseptic conditions, in front of the patient. Neither the pills nor the bottle was removed from the patient's sight at any time. Once count was completed pills were immediately returned to the patient in their original bottle.  Medication: Tramadol (Ultram) Pill/Patch Count: 75 of 270 pills remain Pill/Patch Appearance: Markings consistent with prescribed medication Bottle Appearance: Standard pharmacy container. Clearly labeled. Filled Date: 01 / 14 / 2022 Last Medication intake:  Yesterday

## 2020-10-09 NOTE — Patient Instructions (Addendum)
____________________________________________________________________________________________  Preparing for your procedure (without sedation)  Procedure appointments are limited to planned procedures: . No Prescription Refills. . No disability issues will be discussed. . No medication changes will be discussed.  Instructions: . Oral Intake: Do not eat or drink anything for at least 6 hours prior to your procedure. (Exception: Blood Pressure Medication. See below.) . Transportation: Unless otherwise stated by your physician, you may drive yourself after the procedure. . Blood Pressure Medicine: Do not forget to take your blood pressure medicine with a sip of water the morning of the procedure. If your Diastolic (lower reading)is above 100 mmHg, elective cases will be cancelled/rescheduled. . Blood thinners: These will need to be stopped for procedures. Notify our staff if you are taking any blood thinners. Depending on which one you take, there will be specific instructions on how and when to stop it. . Diabetics on insulin: Notify the staff so that you can be scheduled 1st case in the morning. If your diabetes requires high dose insulin, take only  of your normal insulin dose the morning of the procedure and notify the staff that you have done so. . Preventing infections: Shower with an antibacterial soap the morning of your procedure.  . Build-up your immune system: Take 1000 mg of Vitamin C with every meal (3 times a day) the day prior to your procedure. . Antibiotics: Inform the staff if you have a condition or reason that requires you to take antibiotics before dental procedures. . Pregnancy: If you are pregnant, call and cancel the procedure. . Sickness: If you have a cold, fever, or any active infections, call and cancel the procedure. . Arrival: You must be in the facility at least 30 minutes prior to your scheduled procedure. . Children: Do not bring any children with you. . Dress  appropriately: Bring dark clothing that you would not mind if they get stained. . Valuables: Do not bring any jewelry or valuables.  Reasons to call and reschedule or cancel your procedure: (Following these recommendations will minimize the risk of a serious complication.) . Surgeries: Avoid having procedures within 2 weeks of any surgery. (Avoid for 2 weeks before or after any surgery). . Flu Shots: Avoid having procedures within 2 weeks of a flu shots or . (Avoid for 2 weeks before or after immunizations). . Barium: Avoid having a procedure within 7-10 days after having had a radiological study involving the use of radiological contrast. (Myelograms, Barium swallow or enema study). . Heart attacks: Avoid any elective procedures or surgeries for the initial 6 months after a "Myocardial Infarction" (Heart Attack). . Blood thinners: It is imperative that you stop these medications before procedures. Let us know if you if you take any blood thinner.  . Infection: Avoid procedures during or within two weeks of an infection (including chest colds or gastrointestinal problems). Symptoms associated with infections include: Localized redness, fever, chills, night sweats or profuse sweating, burning sensation when voiding, cough, congestion, stuffiness, runny nose, sore throat, diarrhea, nausea, vomiting, cold or Flu symptoms, recent or current infections. It is specially important if the infection is over the area that we intend to treat. . Heart and lung problems: Symptoms that may suggest an active cardiopulmonary problem include: cough, chest pain, breathing difficulties or shortness of breath, dizziness, ankle swelling, uncontrolled high or unusually low blood pressure, and/or palpitations. If you are experiencing any of these symptoms, cancel your procedure and contact your primary care physician for an evaluation.  Remember:  Regular   Business hours are:  Monday to Thursday 8:00 AM to 4:00  PM  Provider's Schedule: Milinda Pointer, MD:  Procedure days: Tuesday and Thursday 7:30 AM to 4:00 PM  Gillis Santa, MD:  Procedure days: Monday and Wednesday 7:30 AM to 4:00 PM ____________________________________________________________________________________________   Preparing for your procedure (without sedation) Instructions: . Oral Intake: Do not eat or drink anything for at least 3 hours prior to your procedure. . Transportation: Unless otherwise stated by your physician, you may drive yourself after the procedure. . Blood Pressure Medicine: Take your blood pressure medicine with a sip of water the morning of the procedure. . Insulin: Take only  of your normal insulin dose. . Preventing infections: Shower with an antibacterial soap the morning of your procedure. . Build-up your immune system: Take 1000 mg of Vitamin C with every meal (3 times a day) the day prior to your procedure. . Pregnancy: If you are pregnant, call and cancel the procedure. . Sickness: If you have a cold, fever, or any active infections, call and cancel the procedure. . Arrival: You must be in the facility at least 30 minutes prior to your scheduled procedure. . Children: Do not bring any children with you. . Dress appropriately: Bring dark clothing that you would not mind if they get stained. . Valuables: Do not bring any jewelry or valuables. Procedure appointments are reserved for interventional treatments only. Marland Kitchen No Prescription Refills. . No medication changes will be discussed during procedure appointments. . No disability issues will be discussed. Marland Kitchen

## 2020-10-23 ENCOUNTER — Ambulatory Visit (INDEPENDENT_AMBULATORY_CARE_PROVIDER_SITE_OTHER): Payer: Medicare Other | Admitting: Urology

## 2020-10-23 ENCOUNTER — Encounter: Payer: Self-pay | Admitting: Urology

## 2020-10-23 VITALS — BP 144/71 | HR 64

## 2020-10-23 DIAGNOSIS — N3281 Overactive bladder: Secondary | ICD-10-CM | POA: Diagnosis not present

## 2020-10-23 DIAGNOSIS — R339 Retention of urine, unspecified: Secondary | ICD-10-CM

## 2020-10-23 NOTE — Progress Notes (Signed)
10/23/2020 10:12 AM   Brittney Simpson [birth year] [id number]  Referring provider: Olin Hauser, DO [address] [place],  [place] [address]  Chief Complaint  Patient presents with  . Cysto    HPI: Consulted to assess the patient for possible bladder infection and incontinence.  History was limited and patient's daughter was helpful.  Patient is hard of hearing.  It appeared she was wearing 2 depends a day in the last 2 weeks she is not leaking but has trouble to go.  She will feel an urge and then she cannot go.  She voids 4-5 times a day.  She will then double void and strain.  She says she is not getting up at night to urinate  Patient likely having obstructive voiding secondary to overactive bladder.  Her referring doctor wanted to place her on mirabegron which I think is a good choice but apparently it was expensive.  Because of the obstructive component I decided to give her Flomax and perform cystoscopy in the next 3 weeks and proceed accordingly.  Today Frequency stable.  Last culture negative Patient clinically not infected today with little bit of burning yesterday.  Urine sent for culture.  Urine looks good She did not want the cystoscopy because the Flomax has dramatically improved her symptoms.  Flow much better and she feels empty  PMH: Past Medical History:  Diagnosis Date  . Anxiety   . Back ache   . Hiatal hernia   . Hyperlipidemia   . Hypertension   . Hypothyroid   . Osteopenia   . Thyroid disease   . UTI (urinary tract infection)     Surgical History: Past Surgical History:  Procedure Laterality Date  . HEMORRHOID SURGERY    . HERNIA REPAIR    . HERNIA REPAIR    . KYPHOPLASTY N/A 01/23/2017   Procedure: [id number];  Surgeon: Hessie Knows, MD;  Location: [facility] ORS;  Service: Orthopedics;  Laterality: N/A;  . KYPHOPLASTY N/A 01/25/2020   Procedure: L4 KYPHOPLASTY;  Surgeon: Hessie Knows, MD;  Location: [facility] ORS;  Service: Orthopedics;   Laterality: N/A;    Home Medications:  Allergies as of 10/23/2020   No Known Allergies     Medication List       Accurate as of October 23, 2020 10:12 AM. If you have any questions, ask your nurse or doctor.        acetaminophen 500 MG tablet Commonly known as: TYLENOL Take 1,000 mg by mouth daily as needed for moderate pain or headache.   amLODipine 5 MG tablet Commonly known as: NORVASC Take 1 tablet (5 mg total) by mouth daily.   clonazePAM 0.5 MG tablet Commonly known as: KLONOPIN TAKE 1/2 TABLET BY MOUTH TWICE DAILY   Cranberry 500 MG Caps Take 500 mg by mouth daily.   furosemide 20 MG tablet Commonly known as: LASIX Take 1.5 tablets (30 mg total) by mouth daily.   latanoprost 0.005 % ophthalmic solution Commonly known as: XALATAN Place 1 drop into both eyes at bedtime.   levothyroxine 112 MCG tablet Commonly known as: SYNTHROID TAKE 1 TABLET BY MOUTH ONCE DAILY ON AN EMPTY STOMACH. WAIT 30 MINS BEFORE TAKING OTHER MEDS.   lisinopril 40 MG tablet Commonly known as: ZESTRIL Take 1 tablet (40 mg total) by mouth daily.   Melatonin 10 MG Tabs Take 10 mg by mouth at bedtime.   metoprolol succinate 100 MG 24 hr tablet Commonly known as: TOPROL-XL TAKE 1 TABLET BY MOUTH  ONCE DAILY WITH FOOD   omeprazole 20 MG capsule Commonly known as: PRILOSEC Take 1 capsule (20 mg total) by mouth daily.   sulfamethoxazole-trimethoprim 400-80 MG tablet Commonly known as: BACTRIM Take 1 tablet by mouth daily.   tamsulosin 0.4 MG Caps capsule Commonly known as: FLOMAX Take 1 capsule (0.4 mg total) by mouth daily.   traMADol 50 MG tablet Commonly known as: ULTRAM Take 1 tablet (50 mg total) by mouth 3 (three) times daily.   traZODone 50 MG tablet Commonly known as: DESYREL Take 1 tablet (50 mg total) by mouth at bedtime.   Vitamin D2 50 MCG (2000 UT) Tabs Take 2,000 Units by mouth daily with lunch.       Allergies: No Known Allergies  Family History: Family  History  Problem Relation Age of Onset  . Stroke Mother   . Cancer Father     Social History:  reports that she has never smoked. She has never used smokeless tobacco. She reports that she does not drink alcohol and does not use drugs.  ROS:                                        Physical Exam: There were no vitals taken for this visit.  Constitutional:  Alert and oriented, No acute distress.   Laboratory Data: Lab Results  Component Value Date   WBC 4.8 05/16/2020   HGB 12.7 05/16/2020   HCT 39.7 05/16/2020   MCV 85.6 05/16/2020   PLT 236 05/16/2020    Lab Results  Component Value Date   CREATININE 0.79 05/16/2020    No results found for: PSA  No results found for: TESTOSTERONE  Lab Results  Component Value Date   HGBA1C 5.2 05/16/2020    Urinalysis    Component Value Date/Time   COLORURINE COLORLESS (A) 06/14/2016 0906   APPEARANCEUR Clear 08/28/2020 0832   LABSPEC 1.003 (L) 06/14/2016 0906   LABSPEC 1.003 07/08/2014 0907   PHURINE 7.0 06/14/2016 0906   GLUCOSEU Negative 08/28/2020 0832   GLUCOSEU Negative 07/08/2014 0907   HGBUR NEGATIVE 06/14/2016 0906   BILIRUBINUR Negative 08/28/2020 0832   BILIRUBINUR Negative 07/08/2014 0907   KETONESUR NEGATIVE 06/14/2016 0906   PROTEINUR Negative 08/28/2020 0832   PROTEINUR NEGATIVE 06/14/2016 0906   NITRITE Negative 08/28/2020 0832   NITRITE NEGATIVE 06/14/2016 0906   LEUKOCYTESUR Negative 08/28/2020 0832   LEUKOCYTESUR Negative 07/08/2014 0907    Pertinent Imaging:   Assessment & Plan: Reassess in 6 months.  Call if urine culture  1. OAB (overactive bladder)  - Urinalysis, Complete   No follow-ups on file.  Reece Packer, MD  [place] [address] [place], [place] [address] [phone]

## 2020-10-24 LAB — MICROSCOPIC EXAMINATION
Bacteria, UA: NONE SEEN
WBC, UA: NONE SEEN /hpf (ref 0–5)

## 2020-10-24 LAB — URINALYSIS, COMPLETE
Bilirubin, UA: NEGATIVE
Glucose, UA: NEGATIVE
Ketones, UA: NEGATIVE
Leukocytes,UA: NEGATIVE
Nitrite, UA: NEGATIVE
Protein,UA: NEGATIVE
RBC, UA: NEGATIVE
Specific Gravity, UA: 1.015 (ref 1.005–1.030)
Urobilinogen, Ur: 0.2 mg/dL (ref 0.2–1.0)
pH, UA: 7 (ref 5.0–7.5)

## 2020-10-25 ENCOUNTER — Telehealth: Payer: Self-pay | Admitting: Urology

## 2020-10-25 NOTE — Telephone Encounter (Signed)
Pt daughter is calling asking about culture results, explained the process and that it's still in preliminary status, she is concerned if her mother has uti without having an antibiotic, she wants to go ahead and start cipro, advised against starting any antibiotics until culture is back to Rx the correct antibiotic.  Please advise as she went on to explain that the pt pcp would always call cipro in at first sign of uti

## 2020-10-25 NOTE — Telephone Encounter (Signed)
Returned call-informed and reviewed UA results-no bacteria seen-still waiting on urine culture results. Patient's daughter voiced understanding.

## 2020-10-27 ENCOUNTER — Other Ambulatory Visit: Payer: Self-pay | Admitting: Family Medicine

## 2020-10-27 DIAGNOSIS — N183 Chronic kidney disease, stage 3 unspecified: Secondary | ICD-10-CM

## 2020-10-27 LAB — CULTURE, URINE COMPREHENSIVE

## 2020-10-30 ENCOUNTER — Telehealth: Payer: Self-pay

## 2020-10-30 DIAGNOSIS — R339 Retention of urine, unspecified: Secondary | ICD-10-CM

## 2020-10-30 DIAGNOSIS — N3281 Overactive bladder: Secondary | ICD-10-CM

## 2020-10-30 MED ORDER — NITROFURANTOIN MACROCRYSTAL 100 MG PO CAPS: 100.0000 mg | ORAL_CAPSULE | Freq: Two times a day (BID) | ORAL | 0 refills | Status: AC

## 2020-10-30 NOTE — Telephone Encounter (Signed)
-----   Message from Bjorn Loser, MD sent at 10/28/2020  4:58 PM EDT -----  Macrodantin 100 mg twice a day for 7 days  ----- Message ----- From: Evelina Bucy, CMA Sent: 10/26/2020  11:37 AM EDT To: Bjorn Loser, MD   ----- Message ----- From: Interface, [facility] Lab Results In Sent: 10/24/2020  11:37 AM EDT To: [facility]

## 2020-10-30 NOTE — Telephone Encounter (Signed)
OK per DPR, [facility] notifying patient of ABX being sent in.

## 2020-11-01 ENCOUNTER — Telehealth: Payer: Self-pay | Admitting: Urology

## 2020-11-01 NOTE — Telephone Encounter (Signed)
Duplicate encounter created in error.

## 2020-11-01 NOTE — Telephone Encounter (Signed)
Spoke w/Dr. Bernardo Heater patient should continue on Doxycycline and complete the course since started previously and culture is not resistant. Does not need to start Macrodantin. Left a detailed message for patient's daughter's phone notifying her of this. A report from after hours was not yet received notifying us of the Doxycycline start but chart is now up to date. Called [facility] pharmacy and cancelled Macrodantin script.

## 2020-11-01 NOTE — Telephone Encounter (Signed)
Patient's daughter, Arbie Cookey [phone]) called the office this morning.  She received the voice mail message about the prescription for Macrodantin 100 mg twice a day for 7 days.  Patient's daughter is requesting clarification.  She states that Dr. Bernardo Heater prescribed the patient Doxycycline over the weekend through the after hours call nurse line.  Should she have her mother stop taking the Doxycycline and start taking the Macrodantin?  Please call the daughter at the number listed above to clarify.

## 2020-11-02 ENCOUNTER — Other Ambulatory Visit: Payer: Self-pay | Admitting: Family Medicine

## 2020-11-02 DIAGNOSIS — K219 Gastro-esophageal reflux disease without esophagitis: Secondary | ICD-10-CM

## 2020-11-04 ENCOUNTER — Other Ambulatory Visit: Payer: Self-pay | Admitting: Family Medicine

## 2020-11-04 DIAGNOSIS — I129 Hypertensive chronic kidney disease with stage 1 through stage 4 chronic kidney disease, or unspecified chronic kidney disease: Secondary | ICD-10-CM
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
## 2020-11-09 ENCOUNTER — Ambulatory Visit (INDEPENDENT_AMBULATORY_CARE_PROVIDER_SITE_OTHER): Payer: Medicare Other | Admitting: Nurse Practitioner

## 2020-11-09 ENCOUNTER — Other Ambulatory Visit (INDEPENDENT_AMBULATORY_CARE_PROVIDER_SITE_OTHER): Payer: Medicare Other

## 2020-11-10 ENCOUNTER — Other Ambulatory Visit (INDEPENDENT_AMBULATORY_CARE_PROVIDER_SITE_OTHER): Payer: Medicare Other

## 2020-11-10 ENCOUNTER — Ambulatory Visit (INDEPENDENT_AMBULATORY_CARE_PROVIDER_SITE_OTHER): Payer: Medicare Other | Admitting: Nurse Practitioner

## 2020-11-21 ENCOUNTER — Other Ambulatory Visit (INDEPENDENT_AMBULATORY_CARE_PROVIDER_SITE_OTHER): Payer: Self-pay | Admitting: Vascular Surgery

## 2020-11-21 DIAGNOSIS — I714 Abdominal aortic aneurysm, without rupture, unspecified: Secondary | ICD-10-CM

## 2020-11-22 ENCOUNTER — Other Ambulatory Visit: Payer: Self-pay

## 2020-11-22 ENCOUNTER — Ambulatory Visit (INDEPENDENT_AMBULATORY_CARE_PROVIDER_SITE_OTHER): Payer: Medicare Other | Admitting: Nurse Practitioner

## 2020-11-22 ENCOUNTER — Encounter (INDEPENDENT_AMBULATORY_CARE_PROVIDER_SITE_OTHER): Payer: Self-pay | Admitting: Nurse Practitioner

## 2020-11-22 ENCOUNTER — Ambulatory Visit (INDEPENDENT_AMBULATORY_CARE_PROVIDER_SITE_OTHER): Payer: Medicare Other

## 2020-11-22 VITALS — BP 165/76 | HR 60 | Ht 63.0 in | Wt 116.0 lb

## 2020-11-22 DIAGNOSIS — E782 Mixed hyperlipidemia: Secondary | ICD-10-CM

## 2020-11-22 DIAGNOSIS — I10 Essential (primary) hypertension: Secondary | ICD-10-CM

## 2020-11-22 DIAGNOSIS — N183 Chronic kidney disease, stage 3 unspecified: Secondary | ICD-10-CM | POA: Diagnosis not present

## 2020-11-22 DIAGNOSIS — I714 Abdominal aortic aneurysm, without rupture, unspecified: Secondary | ICD-10-CM

## 2020-11-22 NOTE — Progress Notes (Signed)
Subjective:    Patient ID: Brittney Simpson, female    DOB: 03/12/1928, 87 y.o.   MRN: [id number] Chief Complaint  Patient presents with  . Follow-up    6 Simpson U/S    The patient returns to the office for surveillance of a known abdominal aortic aneurysm. Patient denies abdominal pain or back pain, no other abdominal complaints. No changes suggesting embolic episodes.   There have been no interval changes in the patient's overall health care since his last visit.  Patient denies amaurosis fugax or TIA symptoms. There is no history of claudication or rest pain symptoms of the lower extremities. The patient denies angina or shortness of breath.   Duplex US of the aorta and iliac arteries shows an AAA measured 4.5 cm with no iliac artery aneurysms.  Previous aneurysm measured 4.6 cm.  No significant growth noted.   Review of Systems  Gastrointestinal: Negative for abdominal pain.  All other systems reviewed and are negative.      Objective:   Physical Exam Vitals reviewed.  HENT:     Head: Normocephalic.  Cardiovascular:     Rate and Rhythm: Normal rate.     Pulses: Normal pulses.  Pulmonary:     Effort: Pulmonary effort is normal.  Skin:    General: Skin is warm and dry.  Neurological:     Mental Status: She is alert and oriented to person, place, and time.  Psychiatric:        Mood and Affect: Mood normal.        Behavior: Behavior normal.        Thought Content: Thought content normal.        Judgment: Judgment normal.     BP (!) 165/76   Pulse 60   Ht 5\' 3"  (1.6 m)   Wt 116 lb (52.6 kg)   BMI 20.55 kg/m   Past Medical History:  Diagnosis Date  . Anxiety   . Back ache   . Hiatal hernia   . Hyperlipidemia   . Hypertension   . Hypothyroid   . Osteopenia   . Thyroid disease   . UTI (urinary tract infection)     Social History   Socioeconomic History  . Marital status: Widowed    Spouse name: Not on file  . Number of children: Not on file  . Years  of education: Not on file  . Highest education level: Not on file  Occupational History  . Not on file  Tobacco Use  . Smoking status: Never Smoker  . Smokeless tobacco: Never Used  Substance and Sexual Activity  . Alcohol use: No    Alcohol/week: 0.0 standard drinks  . Drug use: No  . Sexual activity: Yes    Birth control/protection: Injection  Other Topics Concern  . Not on file  Social History Narrative  . Not on file   Social Determinants of Health   Financial Resource Strain: Not on file  Food Insecurity: Not on file  Transportation Needs: Not on file  Physical Activity: Not on file  Stress: Not on file  Social Connections: Not on file  Intimate Partner Violence: Not on file    Past Surgical History:  Procedure Laterality Date  . HEMORRHOID SURGERY    . HERNIA REPAIR    . HERNIA REPAIR    . KYPHOPLASTY N/A 01/23/2017   Procedure: [id number];  Surgeon: Hessie Knows, MD;  Location: [facility] ORS;  Service: Orthopedics;  Laterality: N/A;  . KYPHOPLASTY N/A  01/25/2020   Procedure: L4 KYPHOPLASTY;  Surgeon: Hessie Knows, MD;  Location: [facility] ORS;  Service: Orthopedics;  Laterality: N/A;    Family History  Problem Relation Age of Onset  . Stroke Mother   . Cancer Father     No Known Allergies  CBC Latest Ref Rng & Units 05/16/2020 05/10/2019 11/22/2016  WBC 3.8 - 10.8 Thousand/uL 4.8 5.3 4.8  Hemoglobin 11.7 - 15.5 g/dL 12.7 12.7 11.9  Hematocrit 35.0 - 45.0 % 39.7 39.9 38.8  Platelets 140 - 400 Thousand/uL 236 204 171      CMP     Component Value Date/Time   NA 136 05/16/2020 0855   NA 131 (L) 11/24/2014 1143   K 4.3 05/16/2020 0855   K 4.2 11/24/2014 1143   CL 98 05/16/2020 0855   CL 97 (L) 11/24/2014 1143   CO2 28 05/16/2020 0855   CO2 26 11/24/2014 1143   GLUCOSE 101 (H) 05/16/2020 0855   GLUCOSE 108 (H) 11/24/2014 1143   BUN 8 05/16/2020 0855   BUN 13 11/24/2014 1143   CREATININE 0.79 05/16/2020 0855   CALCIUM 9.7 05/16/2020 0855   CALCIUM  8.9 11/24/2014 1143   PROT 6.7 05/16/2020 0855   PROT 7.4 07/08/2014 0907   ALBUMIN 3.6 11/22/2016 0823   ALBUMIN 3.4 07/08/2014 0907   AST 13 05/16/2020 0855   AST 26 07/08/2014 0907   ALT 9 05/16/2020 0855   ALT 25 07/08/2014 0907   ALKPHOS 59 11/22/2016 0823   ALKPHOS 74 07/08/2014 0907   BILITOT 0.4 05/16/2020 0855   BILITOT 0.4 07/08/2014 0907   GFRNONAA 66 05/16/2020 0855   GFRAA 76 05/16/2020 0855     No results found.     Assessment & Plan:   1. Abdominal aortic aneurysm (AAA) without rupture ([place]) No surgery or intervention at this time. The patient has an asymptomatic abdominal aortic aneurysm that is greater than 4 cm but less than 5 cm in maximal diameter.  I have discussed the natural history of abdominal aortic aneurysm and the small risk of rupture for aneurysm less than 5 cm in size.  However, as these small aneurysms tend to enlarge over time, continued surveillance with ultrasound or CT scan is mandatory.  I have also discussed optimizing medical management with hypertension and lipid control and the importance of abstinence from tobacco.  The patient is also encouraged to exercise a minimum of 30 minutes 4 times a week.  Should the patient develop new onset abdominal or back pain or signs of peripheral embolization they are instructed to seek medical attention immediately and to alert the physician providing care that they have an aneurysm.  The patient voices their understanding. I have scheduled the patient to return in 6 months with an aortic duplex.  2. Primary hypertension Continue antihypertensive medications as already ordered, these medications have been reviewed and there are no changes at this time.   3. Mixed hyperlipidemia Continue statin as ordered and reviewed, no changes at this time   4. Stage 3 chronic kidney disease, unspecified whether stage 3a or 3b CKD (HCC) Given the patient's chronic kidney disease, we will try to only utilize contrast  procedures when the patient's aneurysm has reached over the 5 cm threshold.   Current Outpatient Medications on File Prior to Visit  Medication Sig Dispense Refill  . acetaminophen (TYLENOL) 500 MG tablet Take 1,000 mg by mouth daily as needed for moderate pain or headache.    Marland Kitchen amLODipine (NORVASC) 5  MG tablet Take 1 tablet (5 mg total) by mouth daily. 90 tablet 3  . clonazePAM (KLONOPIN) 0.5 MG tablet TAKE 1/2 TABLET BY MOUTH TWICE DAILY 30 tablet 2  . Cranberry 500 MG CAPS Take 500 mg by mouth daily.     . Ergocalciferol (VITAMIN D2) 2000 UNITS TABS Take 2,000 Units by mouth daily with lunch.     . furosemide (LASIX) 20 MG tablet TAKE 1 and 1/2 TABLETS BY MOUTH ONCE DAILY 45 tablet 5  . latanoprost (XALATAN) 0.005 % ophthalmic solution Place 1 drop into both eyes at bedtime.     Marland Kitchen levothyroxine (SYNTHROID) 112 MCG tablet TAKE 1 TABLET BY MOUTH ONCE DAILY ON AN EMPTY STOMACH. WAIT 30 MINS BEFORE TAKING OTHER MEDS. 90 tablet 0  . lisinopril (ZESTRIL) 40 MG tablet Take 1 tablet (40 mg total) by mouth daily. 90 tablet 3  . Melatonin 10 MG TABS Take 10 mg by mouth at bedtime.    . metoprolol succinate (TOPROL-XL) 100 MG 24 hr tablet TAKE 1 TABLET BY MOUTH ONCE DAILY WITH FOOD 90 tablet 0  . omeprazole (PRILOSEC) 20 MG capsule TAKE 1 CAPSULE BY MOUTH ONCE DAILY 30 capsule 0  . sulfamethoxazole-trimethoprim (BACTRIM) 400-80 MG tablet Take 1 tablet by mouth daily.     . tamsulosin (FLOMAX) 0.4 MG CAPS capsule Take 1 capsule (0.4 mg total) by mouth daily. 30 capsule 11  . traMADol (ULTRAM) 50 MG tablet Take 1 tablet (50 mg total) by mouth 3 (three) times daily. 270 tablet 0  . traZODone (DESYREL) 50 MG tablet Take 1 tablet (50 mg total) by mouth at bedtime. 90 tablet 3   No current facility-administered medications on file prior to visit.    There are no Patient Instructions on file for this visit. No follow-ups on file.   Kris Hartmann, NP

## 2020-11-28 ENCOUNTER — Ambulatory Visit (HOSPITAL_BASED_OUTPATIENT_CLINIC_OR_DEPARTMENT_OTHER): Payer: Medicare Other | Admitting: Pain Medicine

## 2020-11-28 ENCOUNTER — Ambulatory Visit
Admission: RE | Admit: 2020-11-28 | Discharge: 2020-11-28 | Disposition: A | Discharge status: Home / Self Care | Payer: Medicare Other | Source: Ambulatory Visit | Attending: Pain Medicine | Admitting: Pain Medicine

## 2020-11-28 ENCOUNTER — Encounter: Payer: Self-pay | Admitting: Pain Medicine

## 2020-11-28 ENCOUNTER — Other Ambulatory Visit: Payer: Self-pay

## 2020-11-28 VITALS — BP 175/82 | HR 65 | Temp 97.0°F | Resp 16 | Ht 63.0 in | Wt 114.0 lb

## 2020-11-28 DIAGNOSIS — M545 Low back pain, unspecified: Secondary | ICD-10-CM | POA: Insufficient documentation

## 2020-11-28 DIAGNOSIS — M5135 Other intervertebral disc degeneration, thoracolumbar region: Secondary | ICD-10-CM | POA: Insufficient documentation

## 2020-11-28 DIAGNOSIS — G8929 Other chronic pain: Secondary | ICD-10-CM

## 2020-11-28 DIAGNOSIS — M4317 Spondylolisthesis, lumbosacral region: Secondary | ICD-10-CM | POA: Insufficient documentation

## 2020-11-28 DIAGNOSIS — M48061 Spinal stenosis, lumbar region without neurogenic claudication: Secondary | ICD-10-CM

## 2020-11-28 DIAGNOSIS — S22080S Wedge compression fracture of T11-T12 vertebra, sequela: Secondary | ICD-10-CM

## 2020-11-28 MED ORDER — ROPIVACAINE HCL 2 MG/ML IJ SOLN
2.0000 mL | Freq: Once | INTRAMUSCULAR | Status: AC
  Administered 2020-11-28: 2 mL via EPIDURAL
  Filled 2020-11-28: qty 10

## 2020-11-28 MED ORDER — LIDOCAINE HCL 2 % IJ SOLN
20.0000 mL | Freq: Once | INTRAMUSCULAR | Status: AC
  Administered 2020-11-28: 400 mg
  Filled 2020-11-28: qty 40

## 2020-11-28 MED ORDER — TRIAMCINOLONE ACETONIDE 40 MG/ML IJ SUSP
40.0000 mg | Freq: Once | INTRAMUSCULAR | Status: AC
Start: 2020-11-28 — End: 2020-11-28
  Administered 2020-11-28: 40 mg
  Filled 2020-11-28: qty 1

## 2020-11-28 MED ORDER — IOHEXOL 180 MG/ML  SOLN
10.0000 mL | Freq: Once | INTRAMUSCULAR | Status: AC
Start: 2020-11-28 — End: 2020-11-28
  Administered 2020-11-28: 10 mL via EPIDURAL

## 2020-11-28 MED ORDER — SODIUM CHLORIDE 0.9% FLUSH
2.0000 mL | Freq: Once | INTRAVENOUS | Status: AC
Start: 2020-11-28 — End: 2020-11-28
  Administered 2020-11-28: 2 mL

## 2020-11-28 NOTE — Patient Instructions (Addendum)
____________________________________________________________________________________________  Post-Procedure Discharge Instructions  Instructions:  Apply ice:   Purpose: This will minimize any swelling and discomfort after procedure.   When: Day of procedure, as soon as you get home.  How: Fill a plastic sandwich bag with crushed ice. Cover it with a small towel and apply to injection site.  How long: (15 min on, 15 min off) Apply for 15 minutes then remove x 15 minutes.  Repeat sequence on day of procedure, until you go to bed.  Apply heat:   Purpose: To treat any soreness and discomfort from the procedure.  When: Starting the next day after the procedure.  How: Apply heat to procedure site starting the day following the procedure.  How long: May continue to repeat daily, until discomfort goes away.  Food intake: Start with clear liquids (like water) and advance to regular food, as tolerated.   Physical activities: Keep activities to a minimum for the first 8 hours after the procedure. After that, then as tolerated.  Driving: If you have received any sedation, be responsible and do not drive. You are not allowed to drive for 24 hours after having sedation.  Blood thinner: (Applies only to those taking blood thinners) You may restart your blood thinner 6 hours after your procedure.  Insulin: (Applies only to Diabetic patients taking insulin) As soon as you can eat, you may resume your normal dosing schedule.  Infection prevention: Keep procedure site clean and dry. Shower daily and clean area with soap and water.  Post-procedure Pain Diary: Extremely important that this be done correctly and accurately. Recorded information will be used to determine the next step in treatment. For the purpose of accuracy, follow these rules:  Evaluate only the area treated. Do not report or include pain from an untreated area. For the purpose of this evaluation, ignore all other areas of pain,  except for the treated area.  After your procedure, avoid taking a long nap and attempting to complete the pain diary after you wake up. Instead, set your alarm clock to go off every hour, on the hour, for the initial 8 hours after the procedure. Document the duration of the numbing medicine, and the relief you are getting from it.  Do not go to sleep and attempt to complete it later. It will not be accurate. If you received sedation, it is likely that you were given a medication that may cause amnesia. Because of this, completing the diary at a later time may cause the information to be inaccurate. This information is needed to plan your care.  Follow-up appointment: Keep your post-procedure follow-up evaluation appointment after the procedure (usually 2 weeks for most procedures, 6 weeks for radiofrequencies). DO NOT FORGET to bring you pain diary with you.   Expect: (What should I expect to see with my procedure?)  From numbing medicine (AKA: Local Anesthetics): Numbness or decrease in pain. You may also experience some weakness, which if present, could last for the duration of the local anesthetic.  Onset: Full effect within 15 minutes of injected.  Duration: It will depend on the type of local anesthetic used. On the average, 1 to 8 hours.   From steroids (Applies only if steroids were used): Decrease in swelling or inflammation. Once inflammation is improved, relief of the pain will follow.  Onset of benefits: Depends on the amount of swelling present. The more swelling, the longer it will take for the benefits to be seen. In some cases, up to 10 days.    Duration: Steroids will stay in the system x 2 weeks. Duration of benefits will depend on multiple posibilities including persistent irritating factors.  Side-effects: If present, they may typically last 2 weeks (the duration of the steroids).  Frequent: Cramps (if they occur, drink Gatorade and take over-the-counter Magnesium 450-500 mg  once to twice a day); water retention with temporary weight gain; increases in blood sugar; decreased immune system response; increased appetite.  Occasional: Facial flushing (red, warm cheeks); mood swings; menstrual changes.  Uncommon: Long-term decrease or suppression of natural hormones; bone thinning. (These are more common with higher doses or more frequent use. This is why we prefer that our patients avoid having any injection therapies in other practices.)   Very Rare: Severe mood changes; psychosis; aseptic necrosis.  From procedure: Some discomfort is to be expected once the numbing medicine wears off. This should be minimal if ice and heat are applied as instructed.  Call if: (When should I call?)  You experience numbness and weakness that gets worse with time, as opposed to wearing off.  New onset bowel or bladder incontinence. (Applies only to procedures done in the spine)  Emergency Numbers:  Durning business hours (Monday - Thursday, 8:00 AM - 4:00 PM) (Friday, 9:00 AM - 12:00 Noon): [phone]  After hours: [phone]  NOTE: If you are having a problem and are unable connect with, or to talk to a provider, then go to your nearest urgent care or emergency department. If the problem is serious and urgent, please call 911. ____________________________________________________________________________________________   Pain Management Discharge Instructions  General Discharge Instructions :  If you need to reach your doctor call: Monday-Friday 8:00 am - 4:00 pm at [phone] or toll free [phone].  After clinic hours [phone] to have operator reach doctor.  Bring all of your medication bottles to all your appointments in the pain clinic.  To cancel or reschedule your appointment with Pain Management please remember to call 24 hours in advance to avoid a fee.  Refer to the educational materials which you have been given on: General Risks, I had my  Procedure. Discharge Instructions, Post Sedation.  Post Procedure Instructions:  The drugs you were given will stay in your system until tomorrow, so for the next 24 hours you should not drive, make any legal decisions or drink any alcoholic beverages.  You may eat anything you prefer, but it is better to start with liquids then soups and crackers, and gradually work up to solid foods.  Please notify your doctor immediately if you have any unusual bleeding, trouble breathing or pain that is not related to your normal pain.  Depending on the type of procedure that was done, some parts of your body may feel week and/or numb.  This usually clears up by tonight or the next day.  Walk with the use of an assistive device or accompanied by an adult for the 24 hours.  You may use ice on the affected area for the first 24 hours.  Put ice in a Ziploc bag and cover with a towel and place against area 15 minutes on 15 minutes off.  You may switch to heat after 24 hours.Epidural Steroid Injection Patient Information  Description: The epidural space surrounds the nerves as they exit the spinal cord.  In some patients, the nerves can be compressed and inflamed by a bulging disc or a tight spinal canal (spinal stenosis).  By injecting steroids into the epidural space, we can bring irritated nerves   into direct contact with a potentially helpful medication.  These steroids act directly on the irritated nerves and can reduce swelling and inflammation which often leads to decreased pain.  Epidural steroids may be injected anywhere along the spine and from the neck to the low back depending upon the location of your pain.   After numbing the skin with local anesthetic (like Novocaine), a small needle is passed into the epidural space slowly.  You may experience a sensation of pressure while this is being done.  The entire block usually last less than 10 minutes.  Conditions which may be treated by epidural  steroids:   Low back and leg pain  Neck and arm pain  Spinal stenosis  Post-laminectomy syndrome  Herpes zoster (shingles) pain  Pain from compression fractures  Preparation for the injection:  1. Do not eat any solid food or dairy products within 8 hours of your appointment.  2. You may drink clear liquids up to 3 hours before appointment.  Clear liquids include water, black coffee, juice or soda.  No milk or cream please. 3. You may take your regular medication, including pain medications, with a sip of water before your appointment  Diabetics should hold regular insulin (if taken separately) and take 1/2 normal NPH dos the morning of the procedure.  Carry some sugar containing items with you to your appointment. 4. A driver must accompany you and be prepared to drive you home after your procedure.  5. Bring all your current medications with your. 6. An IV may be inserted and sedation may be given at the discretion of the physician.   7. A blood pressure cuff, EKG and other monitors will often be applied during the procedure.  Some patients may need to have extra oxygen administered for a short period. 8. You will be asked to provide medical information, including your allergies, prior to the procedure.  We must know immediately if you are taking blood thinners (like Coumadin/Warfarin)  Or if you are allergic to IV iodine contrast (dye). We must know if you could possible be pregnant.  Possible side-effects:  Bleeding from needle site  Infection (rare, may require surgery)  Nerve injury (rare)  Numbness & tingling (temporary)  Difficulty urinating (rare, temporary)  Spinal headache ( a headache worse with upright posture)  Light -headedness (temporary)  Pain at injection site (several days)  Decreased blood pressure (temporary)  Weakness in arm/leg (temporary)  Pressure sensation in back/neck (temporary)  Call if you experience:  Fever/chills associated with  headache or increased back/neck pain.  Headache worsened by an upright position.  New onset weakness or numbness of an extremity below the injection site  Hives or difficulty breathing (go to the emergency room)  Inflammation or drainage at the infection site  Severe back/neck pain  Any new symptoms which are concerning to you  Please note:  Although the local anesthetic injected can often make your back or neck feel good for several hours after the injection, the pain will likely return.  It takes 3-7 days for steroids to work in the epidural space.  You may not notice any pain relief for at least that one week.  If effective, we will often do a series of three injections spaced 3-6 weeks apart to maximally decrease your pain.  After the initial series, we generally will wait several months before considering a repeat injection of the same type.  If you have any questions, please call [phone] [facility] 

## 2020-11-28 NOTE — Progress Notes (Signed)
PROVIDER NOTE: Information contained herein reflects review and annotations entered in association with encounter. Interpretation of such information and data should be left to medically-trained personnel. Information provided to patient can be located elsewhere in the medical record under "Patient Instructions". Document created using STT-dictation technology, any transcriptional errors that may result from process are unintentional.    Patient: Brittney Simpson  Service Category: Procedure  Provider: Gaspar Cola, MD  DOB: [birth year]  DOS: 11/28/2020  Location: [facility]  MRN: [id number]  Setting: Ambulatory - outpatient  Referring Provider: Nobie Putnam *  Type: Established Patient  Specialty: Interventional Pain Management  PCP: Olin Hauser, DO   Primary Reason for Visit: Interventional Pain Management Treatment. CC: Back Pain (Low, right)  Procedure:          Anesthesia, Analgesia, Anxiolysis:  Type: Therapeutic Inter-Laminar Epidural Steroid Injection  #3  Region: Lumbar Level: T12-L1 Level. Laterality: Right-Sided Paramedial  Type: Local Anesthesia Indication(s): Analgesia         Route: Infiltration ([place]/IM) IV Access: Declined Sedation: Declined  Local Anesthetic: Lidocaine 1-2%  Position: Prone with head of the table was raised to facilitate breathing.   Indications: 1. Chronic low back pain (1ry area of Pain) (Bilateral) (R>L) w/o sciatica   2. DDD (degenerative disc disease), thoracolumbar   3. T12 compression fracture, sequela   4. Spondylolisthesis of lumbosacral region (L2-3 and L5-S1)   5. Spinal stenosis of lumbar region without neurogenic claudication    Pain Score: Pre-procedure: 8 /10 Post-procedure: 0-No pain/10   Pre-op H&P Assessment:  Brittney Simpson is a 87 y.o. (year old), female patient, seen today for interventional treatment. She  has a past surgical history that includes Hernia repair; Hernia repair;  Hemorrhoid surgery; Kyphoplasty (N/A, 01/23/2017); and Kyphoplasty (N/A, 01/25/2020). Brittney Simpson has a current medication list which includes the following prescription(s): acetaminophen, amlodipine, clonazepam, cranberry, vitamin d2, furosemide, latanoprost, levothyroxine, lisinopril, melatonin, metoprolol succinate, omeprazole, tamsulosin, tramadol, trazodone, and sulfamethoxazole-trimethoprim. Her primarily concern today is the Back Pain (Low, right)  Initial Vital Signs:  Pulse/HCG Rate: 65ECG Heart Rate: 67 Temp: (!) 97 F (36.1 C) Resp: 16 BP: (!) 162/74 SpO2: 99 %  BMI: Estimated body mass index is 20.19 kg/m as calculated from the following:   Height as of this encounter: 5\' 3"  (1.6 m).   Weight as of this encounter: 114 lb (51.7 kg).  Risk Assessment: Allergies: Reviewed. She has No Known Allergies.  Allergy Precautions: None required Coagulopathies: Reviewed. None identified.  Blood-thinner therapy: None at this time Active Infection(s): Reviewed. None identified. Brittney Simpson is afebrile  Site Confirmation: Brittney Simpson was asked to confirm the procedure and laterality before marking the site Procedure checklist: Completed Consent: Before the procedure and under the influence of no sedative(s), amnesic(s), or anxiolytics, the patient was informed of the treatment options, risks and possible complications. To fulfill our ethical and legal obligations, as recommended by the American Medical Association's Code of Ethics, I have informed the patient of my clinical impression; the nature and purpose of the treatment or procedure; the risks, benefits, and possible complications of the intervention; the alternatives, including doing nothing; the risk(s) and benefit(s) of the alternative treatment(s) or procedure(s); and the risk(s) and benefit(s) of doing nothing. The patient was provided information about the general risks and possible complications associated with the procedure. These  may include, but are not limited to: failure to achieve desired goals, infection, bleeding, organ or nerve damage, allergic reactions, paralysis, and death. In  addition, the patient was informed of those risks and complications associated to Spine-related procedures, such as failure to decrease pain; infection (i.e.: Meningitis, epidural or intraspinal abscess); bleeding (i.e.: epidural hematoma, subarachnoid hemorrhage, or any other type of intraspinal or peri-dural bleeding); organ or nerve damage (i.e.: Any type of peripheral nerve, nerve root, or spinal cord injury) with subsequent damage to sensory, motor, and/or autonomic systems, resulting in permanent pain, numbness, and/or weakness of one or several areas of the body; allergic reactions; (i.e.: anaphylactic reaction); and/or death. Furthermore, the patient was informed of those risks and complications associated with the medications. These include, but are not limited to: allergic reactions (i.e.: anaphylactic or anaphylactoid reaction(s)); adrenal axis suppression; blood sugar elevation that in diabetics may result in ketoacidosis or comma; water retention that in patients with history of congestive heart failure may result in shortness of breath, pulmonary edema, and decompensation with resultant heart failure; weight gain; swelling or edema; medication-induced neural toxicity; particulate matter embolism and blood vessel occlusion with resultant organ, and/or nervous system infarction; and/or aseptic necrosis of one or more joints. Finally, the patient was informed that Medicine is not an exact science; therefore, there is also the possibility of unforeseen or unpredictable risks and/or possible complications that may result in a catastrophic outcome. The patient indicated having understood very clearly. We have given the patient no guarantees and we have made no promises. Enough time was given to the patient to ask questions, all of which were  answered to the patient's satisfaction. Brittney Simpson has indicated that she wanted to continue with the procedure. Attestation: I, the ordering provider, attest that I have discussed with the patient the benefits, risks, side-effects, alternatives, likelihood of achieving goals, and potential problems during recovery for the procedure that I have provided informed consent. Date  Time: 11/28/2020  8:30 AM  Pre-Procedure Preparation:  Monitoring: As per clinic protocol. Respiration, ETCO2, SpO2, BP, heart rate and rhythm monitor placed and checked for adequate function Safety Precautions: Patient was assessed for positional comfort and pressure points before starting the procedure. Time-out: I initiated and conducted the "Time-out" before starting the procedure, as per protocol. The patient was asked to participate by confirming the accuracy of the "Time Out" information. Verification of the correct person, site, and procedure were performed and confirmed by me, the nursing staff, and the patient. "Time-out" conducted as per Joint Commission's Universal Protocol (UP.01.01.01). Time: 0908  Description of Procedure:          Target Area: The interlaminar space, initially targeting the lower laminar border of the superior vertebral body. Approach: Paramedial approach. Area Prepped: Entire Posterior Lumbar Region DuraPrep (Iodine Povacrylex [0.7% available iodine] and Isopropyl Alcohol, 74% w/w) Safety Precautions: Aspiration looking for blood return was conducted prior to all injections. At no point did we inject any substances, as a needle was being advanced. No attempts were made at seeking any paresthesias. Safe injection practices and needle disposal techniques used. Medications properly checked for expiration dates. SDV (single dose vial) medications used. Description of the Procedure: Protocol guidelines were followed. The procedure needle was introduced through the skin, ipsilateral to the reported  pain, and advanced to the target area. Bone was contacted and the needle walked caudad, until the lamina was cleared. The epidural space was identified using "loss-of-resistance technique" with 2-3 ml of PF-NaCl (0.9% NSS), in a 5cc LOR glass syringe.  Vitals:   11/28/20 0829 11/28/20 0906 11/28/20 0911 11/28/20 0916  BP: (!) 162/74 (!) 165/86 (!) 175/81 Marland Kitchen)  175/82  Pulse: 65     Resp: 16 12 14 16   Temp: (!) 97 F (36.1 C)     TempSrc: Temporal     SpO2: 99% 97% 97% 97%  Weight: 114 lb (51.7 kg)     Height: 5\' 3"  (1.6 m)       Start Time: 0908 hrs. End Time: 0915 hrs.  Materials:  Needle(s) Type: Epidural needle Gauge: 17G Length: 3.5-in Medication(s): Please see orders for medications and dosing details.  Imaging Guidance (Spinal):          Type of Imaging Technique: Fluoroscopy Guidance (Spinal) Indication(s): Assistance in needle guidance and placement for procedures requiring needle placement in or near specific anatomical locations not easily accessible without such assistance. Exposure Time: Please see nurses notes. Contrast: Before injecting any contrast, we confirmed that the patient did not have an allergy to iodine, shellfish, or radiological contrast. Once satisfactory needle placement was completed at the desired level, radiological contrast was injected. Contrast injected under live fluoroscopy. No contrast complications. See chart for type and volume of contrast used. Fluoroscopic Guidance: I was personally present during the use of fluoroscopy. "Tunnel Vision Technique" used to obtain the best possible view of the target area. Parallax error corrected before commencing the procedure. "Direction-depth-direction" technique used to introduce the needle under continuous pulsed fluoroscopy. Once target was reached, antero-posterior, oblique, and lateral fluoroscopic projection used confirm needle placement in all planes. Images permanently stored in EMR. Interpretation: I  personally interpreted the imaging intraoperatively. Adequate needle placement confirmed in multiple planes. Appropriate spread of contrast into desired area was observed. No evidence of afferent or efferent intravascular uptake. No intrathecal or subarachnoid spread observed. Permanent images saved into the patient's record.  Antibiotic Prophylaxis:   Anti-infectives (From admission, onward)   None     Indication(s): None identified  Post-operative Assessment:  Post-procedure Vital Signs:  Pulse/HCG Rate: 6565 Temp: (!) 97 F (36.1 C) Resp: 16 BP: (!) 175/82 SpO2: 97 %  EBL: None  Complications: No immediate post-treatment complications observed by team, or reported by patient.  Note: The patient tolerated the entire procedure well. A repeat set of vitals were taken after the procedure and the patient was kept under observation following institutional policy, for this type of procedure. Post-procedural neurological assessment was performed, showing return to baseline, prior to discharge. The patient was provided with post-procedure discharge instructions, including a section on how to identify potential problems. Should any problems arise concerning this procedure, the patient was given instructions to immediately contact us, at any time, without hesitation. In any case, we plan to contact the patient by telephone for a follow-up status report regarding this interventional procedure.  Comments:  No additional relevant information.  Plan of Care  Orders:  Orders Placed This Encounter  Procedures  . Lumbar Epidural Injection    Scheduling Instructions:     Procedure: Interlaminar LESI T12-L1     Laterality: Right-sided     Sedation: No Sedation     Timeframe:  Today    Order Specific Question:   Where will this procedure be performed?    Answer:   [facility]  . DG [facility] C-ARM 1-60 MIN NO REPORT    Intraoperative interpretation by procedural physician at [place].    Standing Status:   Standing    Number of Occurrences:   1    Order Specific Question:   Reason for exam:    Answer:   Assistance in  needle guidance and placement for procedures requiring needle placement in or near specific anatomical locations not easily accessible without such assistance.  . Informed Consent Details: Physician/Practitioner Attestation; Transcribe to consent form and obtain patient signature    Note: Always confirm laterality of pain with Ms. Daidone, before procedure. Transcribe to consent form and obtain patient signature.    Order Specific Question:   Physician/Practitioner attestation of informed consent for procedure/surgical case    Answer:   I, the physician/practitioner, attest that I have discussed with the patient the benefits, risks, side effects, alternatives, likelihood of achieving goals and potential problems during recovery for the procedure that I have provided informed consent.    Order Specific Question:   Procedure    Answer:   Lumbar epidural steroid injection under fluoroscopic guidance    Order Specific Question:   Physician/Practitioner performing the procedure    Answer:   Halena Mohar A. Dossie Arbour, MD    Order Specific Question:   Indication/Reason    Answer:   Low back and/or lower extremity pain secondary to lumbar radiculitis  . Provide equipment / supplies at bedside    "Epidural Tray" (Disposable  single use) Catheter: NOT required    Standing Status:   Standing    Number of Occurrences:   1    Order Specific Question:   Specify    Answer:   Epidural Tray   Chronic Opioid Analgesic:  Tramadol 50 mg, 1 tab PO q 8 hrs (150 mg/day of tramadol) MME/day: 15 mg/day.   Medications ordered for procedure: Meds ordered this encounter  Medications  . iohexol (OMNIPAQUE) 180 MG/ML injection 10 mL    Must be Myelogram-compatible. If not available, you may substitute with a water-soluble, non-ionic, hypoallergenic, myelogram-compatible  radiological contrast medium.  Marland Kitchen lidocaine (XYLOCAINE) 2 % (with pres) injection 400 mg  . sodium chloride flush (NS) 0.9 % injection 2 mL  . ropivacaine (PF) 2 mg/mL (0.2%) (NAROPIN) injection 2 mL  . triamcinolone acetonide (KENALOG-40) injection 40 mg   Medications administered: We administered iohexol, lidocaine, sodium chloride flush, ropivacaine (PF) 2 mg/mL (0.2%), and triamcinolone acetonide.  See the medical record for exact dosing, route, and time of administration.  Follow-up plan:   Return in about 2 weeks (around 12/12/2020) for (afternoon VV), on procedure day, (PPE).      Interventional Therapies  Risk  Complexity Considerations:   Estimated body mass index is 20.19 kg/m as calculated from the following:   Height as of this encounter: 5\' 3"  (1.6 m).   Weight as of this encounter: 114 lb (51.7 kg). Note: Hard of hearing   Planned  Pending:   Pending further evaluation   Under consideration:   Possible bilateral lumbar facet RFA. (Offerred several times. Pt. chooses to hold.) Diagnostic right hip joint injection Diagnostic right L3 TFESI Diagnostic right trochanteric bursa injection Diagnostic right SI joint block Diagnostic midline caudal ESI (for the tailbone pain)   Completed:   Palliative left T12-L1 LESI x3 (11/28/2020)  Therapeutic right L2-3 LESI x1 (03/07/2020)  Diagnostic/therapeutic bilateral SI joint injection x1 (02/10/2020)  Palliativeright lumbar facet block x13(01/04/2020)  Palliativeleft lumbar facet block x11(01/04/2020)    Therapeutic  Palliative (PRN) options:   Palliative left T12-L1 LESI #4  Palliativeright lumbar facet block #14 Palliativeleft lumbar facet block #12    Recent Visits Date Type Provider Dept  10/09/20 Office Visit Milinda Pointer, MD [facility]  Showing recent visits within past 90 days and meeting all other requirements Today's Visits  Date Type Provider Dept  11/28/20 Procedure visit Milinda Pointer, MD [facility]  Showing today's visits and meeting all other requirements Future Appointments Date Type Provider Dept  12/12/20 Appointment Milinda Pointer, MD [facility]  01/01/21 Appointment Milinda Pointer, MD [facility]  Showing future appointments within next 90 days and meeting all other requirements  Disposition: Discharge home  Discharge (Date  Time): 11/28/2020; 0925 hrs.   Primary Care Physician: Olin Hauser, DO Location: [facility] Note by: Gaspar Cola, MD Date: 11/28/2020; Time: 1:29 PM  Disclaimer:  Medicine is not an Chief Strategy Officer. The only guarantee in medicine is that nothing is guaranteed. It is important to note that the decision to proceed with this intervention was based on the information collected from the patient. The Data and conclusions were drawn from the patient's questionnaire, the interview, and the physical examination. Because the information was provided in large part by the patient, it cannot be guaranteed that it has not been purposely or unconsciously manipulated. Every effort has been made to obtain as much relevant data as possible for this evaluation. It is important to note that the conclusions that lead to this procedure are derived in large part from the available data. Always take into account that the treatment will also be dependent on availability of resources and existing treatment guidelines, considered by other Pain Management Practitioners as being common knowledge and practice, at the time of the intervention. For Medico-Legal purposes, it is also important to point out that variation in procedural techniques and pharmacological choices are the acceptable norm. The indications, contraindications, technique, and results of the above procedure should only be interpreted and judged by a Board-Certified Interventional Pain Specialist with extensive  familiarity and expertise in the same exact procedure and technique.

## 2020-11-28 NOTE — Progress Notes (Signed)
Safety precautions to be maintained throughout the outpatient stay will include: orient to surroundings, keep bed in low position, maintain call bell within reach at all times, provide assistance with transfer out of bed and ambulation.  

## 2020-11-29 ENCOUNTER — Telehealth: Payer: Self-pay

## 2020-11-29 NOTE — Telephone Encounter (Signed)
Spoke with patients daughter.  She states that they think she might have a UTI because she is prone to them.  They questioned whether or not it would be OK to start antibiotics if she in fact has a UTI.  Instructed them that it would be ok to start antibiotics if she has an UTI.

## 2020-11-29 NOTE — Telephone Encounter (Signed)
Post procedure phone call. LM with daughter.  

## 2020-11-29 NOTE — Telephone Encounter (Signed)
Patients daughter called back and wants to speak to [facility], she has a question

## 2020-11-30 ENCOUNTER — Other Ambulatory Visit: Payer: Self-pay | Admitting: Family Medicine

## 2020-11-30 DIAGNOSIS — E034 Atrophy of thyroid (acquired): Secondary | ICD-10-CM

## 2020-12-11 DIAGNOSIS — I10 Essential (primary) hypertension: Secondary | ICD-10-CM | POA: Diagnosis not present

## 2020-12-11 DIAGNOSIS — N390 Urinary tract infection, site not specified: Secondary | ICD-10-CM | POA: Diagnosis not present

## 2020-12-11 DIAGNOSIS — N183 Chronic kidney disease, stage 3 unspecified: Secondary | ICD-10-CM | POA: Diagnosis not present

## 2020-12-12 ENCOUNTER — Telehealth: Payer: Medicare Other | Admitting: Pain Medicine

## 2020-12-12 ENCOUNTER — Encounter: Payer: Self-pay | Admitting: Pain Medicine

## 2020-12-12 NOTE — Progress Notes (Signed)
Patient: Brittney Simpson  Service Category: E/M  Provider: Gaspar Cola, MD  DOB: 1928/04/24  DOS: 12/13/2020  Location: Office  MRN: [id number]  Setting: Ambulatory outpatient  Referring Provider: Nobie Putnam *  Type: Established Patient  Specialty: Interventional Pain Management  PCP: Olin Hauser, DO  Location: Remote location  Delivery: TeleHealth     Virtual Encounter - Pain Management PROVIDER NOTE: Information contained herein reflects review and annotations entered in association with encounter. Interpretation of such information and data should be left to medically-trained personnel. Information provided to patient can be located elsewhere in the medical record under "Patient Instructions". Document created using STT-dictation technology, any transcriptional errors that may result from process are unintentional.    Contact & Pharmacy Preferred: [place]: [phone] (home) Mobile: [phone] (mobile) E-mail: No e-mail address on record  [place], [place] [place] [address] Phone: [phone] Fax: [phone]   Pre-screening  Brittney Simpson offered "in-person" vs "virtual" encounter. She indicated preferring virtual for this encounter.   Reason COVID-19*  Social distancing based on CDC and AMA recommendations.   I contacted Brittney Simpson on 12/13/2020 via telephone.      I clearly identified myself as Gaspar Cola, MD. I verified that I was speaking with the correct person using two identifiers (Name: Brittney Simpson, and date of birth: 1928-04-07).  Consent I sought verbal advanced consent from Brittney Simpson for virtual visit interactions. I informed Brittney Simpson of possible security and privacy concerns, risks, and limitations associated with providing "not-in-person" medical evaluation and management services. I also informed Brittney Simpson of the availability of "in-person"  appointments. Finally, I informed her that there would be a charge for the virtual visit and that she could be  personally, fully or partially, financially responsible for it. Brittney Simpson expressed understanding and agreed to proceed.   Historic Elements   Brittney Simpson is a 87 y.o. year old, female patient evaluated today after our last contact on 11/28/2020. Brittney Simpson  has a past medical history of Anxiety, Back ache, Hiatal hernia, Hyperlipidemia, Hypertension, Hypothyroid, Osteopenia, Thyroid disease, and UTI (urinary tract infection). She also  has a past surgical history that includes Hernia repair; Hernia repair; Hemorrhoid surgery; Kyphoplasty (N/A, 01/23/2017); and Kyphoplasty (N/A, 01/25/2020). Brittney Simpson has a current medication list which includes the following prescription(s): acetaminophen, amlodipine, clonazepam, cranberry, vitamin d2, furosemide, latanoprost, levothyroxine, lisinopril, melatonin, metoprolol succinate, omeprazole, sulfamethoxazole-trimethoprim, tamsulosin, tramadol, and trazodone. She  reports that she has never smoked. She has never used smokeless tobacco. She reports that she does not drink alcohol and does not use drugs. Brittney Simpson has No Known Allergies.   HPI  Today, she is being contacted for a post-procedure assessment.  Information attained from her daughter.  She indicates that she is doing great after the injection and she is currently enjoying an ongoing 75% relief of the pain.  Today I have reminded her to bring her pills to be counted on the 01/01/2021 appointment.  RTCB: 01/01/2021.  She will be needing a UDS at that time.  Post-Procedure Evaluation  Procedure (11/28/2020): Therapeutic right T12-L1 LESI #3 under fluoroscopic guidance, no sedation Pre-procedure pain level: 8/10 Post-procedure: 0/10 (100% relief)  Sedation: None.  Effectiveness during initial hour after procedure(Ultra-Short Term Relief): 100 %.  Local anesthetic used: Long-acting  (4-6 hours) Effectiveness: Defined as any analgesic benefit obtained secondary to the administration of local anesthetics. This carries  significant diagnostic value as to the etiological location, or anatomical origin, of the pain. Duration of benefit is expected to coincide with the duration of the local anesthetic used.  Effectiveness during initial 4-6 hours after procedure(Short-Term Relief): 100 %.  Long-term benefit: Defined as any relief past the pharmacologic duration of the local anesthetics.  Effectiveness past the initial 6 hours after procedure(Long-Term Relief): 75 % (current).  Current benefits: Defined as benefit that persist at this time.   Analgesia:  The patient describes having attained an ongoing 75% relief of her low back and lower extremity pain. Function: Brittney Simpson reports improvement in function ROM: Brittney Simpson reports improvement in ROM  Pharmacotherapy Assessment  Analgesic: Tramadol 50 mg, 1 tab PO q 8 hrs (150 mg/day of tramadol) MME/day: 15 mg/day.   Monitoring: Brittney Simpson PMP: Brittney Simpson reviewed during this encounter.       Pharmacotherapy: No side-effects or adverse reactions reported. Compliance: No problems identified. Effectiveness: Clinically acceptable. Plan: Refer to "POC".  UDS:  Summary  Date Value Ref Range Status  10/28/2017 FINAL  Final    Comment:    ==================================================================== TOXASSURE SELECT 13 (MW) ==================================================================== Test                             Result       Flag       Units Drug Present and Declared for Prescription Verification   Tramadol                       >20000       EXPECTED   ng/mg creat   O-Desmethyltramadol            >20000       EXPECTED   ng/mg creat   N-Desmethyltramadol            8244         EXPECTED   ng/mg creat    Source of tramadol is a prescription medication.    O-desmethyltramadol and N-desmethyltramadol are expected     metabolites of tramadol. Drug Present not Declared for Prescription Verification   7-aminoclonazepam              248          UNEXPECTED ng/mg creat    7-aminoclonazepam is an expected metabolite of clonazepam. Source    of clonazepam is a scheduled prescription medication. ==================================================================== Test                      Result    Flag   Units      Ref Range   Creatinine              25               mg/dL      >=20 ==================================================================== Declared Medications:  The flagging and interpretation on this report are based on the  following declared medications.  Unexpected results may arise from  inaccuracies in the declared medications.  **Note: The testing scope of this panel includes these medications:  Tramadol  **Note: The testing scope of this panel does not include following  reported medications:  Acetaminophen  Amlodipine  Brimonidine Tartrate  Cranberry Extract  Furosemide  Latanoprost  Levothyroxine  Lidocaine  Lisinopril  Metoprolol  Omeprazole  Timolol  Trazodone  Vitamin D2 ==================================================================== For clinical consultation, please call [phone]. ====================================================================  Laboratory Chemistry Profile   Renal Lab Results  Component Value Date   BUN 8 05/16/2020   CREATININE 0.79 50/09/7046   BCR NOT APPLICABLE 88/91/6945   GFRAA 76 05/16/2020   GFRNONAA 66 05/16/2020     Hepatic Lab Results  Component Value Date   AST 13 05/16/2020   ALT 9 05/16/2020   ALBUMIN 3.6 11/22/2016   ALKPHOS 59 11/22/2016   LIPASE 21 06/14/2016     Electrolytes Lab Results  Component Value Date   NA 136 05/16/2020   K 4.3 05/16/2020   CL 98 05/16/2020   CALCIUM 9.7 05/16/2020   MG 2.1 01/14/2014     Bone No results found for: VD25OH, VD125OH2TOT, WT8882CM0, LK9179XT0,  25OHVITD1, 25OHVITD2, 25OHVITD3, TESTOFREE, TESTOSTERONE   Inflammation (CRP: Acute Phase) (ESR: Chronic Phase) No results found for: CRP, ESRSEDRATE, LATICACIDVEN     Note: Above Lab results reviewed.  Imaging  DG PAIN CLINIC C-ARM 1-60 MIN NO REPORT Fluoro was used, but no Radiologist interpretation will be provided.  Please refer to "NOTES" tab for provider progress note.  Assessment  The primary encounter diagnosis was Chronic pain syndrome. Diagnoses of Chronic low back pain (1ry area of Pain) (Bilateral) (R>L) w/o sciatica, DDD (degenerative disc disease), thoracolumbar, T12 compression fracture, sequela, and Chronic lower extremity pain (Third area of Pain) (Right) were also pertinent to this visit.  Plan of Care  Problem-specific:  No problem-specific Assessment & Plan notes found for this encounter.  Brittney Simpson has a current medication list which includes the following long-term medication(s): amlodipine, clonazepam, furosemide, levothyroxine, lisinopril, metoprolol succinate, omeprazole, tramadol, and trazodone.  Pharmacotherapy (Medications Ordered): No orders of the defined types were placed in this encounter.  Orders:  No orders of the defined types were placed in this encounter.  Follow-up plan:   Return for scheduled encounter.  UDS needed on that appointment.      Interventional Therapies  Risk  Complexity Considerations:   Estimated body mass index is 20.19 kg/m as calculated from the following:   Height as of this encounter: '5\' 3"'  (1.6 m).   Weight as of this encounter: 114 lb (51.7 kg). Note: Hard of hearing   Planned  Pending:   Pending further evaluation   Under consideration:   Possible bilateral lumbar facet RFA. (Offerred several times. Pt. chooses to hold.) Diagnostic right hip joint injection Diagnostic right L3 TFESI Diagnostic right trochanteric bursa injection Diagnostic right SI joint block Diagnostic midline caudal ESI  (for the tailbone pain)   Completed:   Palliative left T12-L1 LESI x3 (11/28/2020)  Therapeutic right L2-3 LESI x1 (03/07/2020)  Diagnostic/therapeutic bilateral SI joint injection x1 (02/10/2020)  Palliativeright lumbar facet block x13(01/04/2020)  Palliativeleft lumbar facet block x11(01/04/2020)    Therapeutic  Palliative (PRN) options:   Palliative left T12-L1 LESI #4  Palliativeright lumbar facet block #14 Palliativeleft lumbar facet block #12    Recent Visits Date Type Provider Dept  11/28/20 Procedure visit Milinda Pointer, MD [facility]  10/09/20 Office Visit Milinda Pointer, MD [facility]  Showing recent visits within past 90 days and meeting all other requirements Today's Visits Date Type Provider Dept  12/13/20 Telemedicine Milinda Pointer, MD [facility]  Showing today's visits and meeting all other requirements Future Appointments Date Type Provider Dept  01/01/21 Appointment Milinda Pointer, MD [facility]  Showing future appointments within next 90 days and meeting all other requirements  I discussed the assessment and treatment plan with the patient.  The patient was provided an opportunity to ask questions and all were answered. The patient agreed with the plan and demonstrated an understanding of the instructions.  Patient advised to call back or seek an in-person evaluation if the symptoms or condition worsens.  Duration of encounter: 13 minutes.  Note by: Gaspar Cola, MD Date: 12/13/2020; Time: 11:42 AM

## 2020-12-13 ENCOUNTER — Ambulatory Visit: Payer: Medicare Other | Attending: Pain Medicine | Admitting: Pain Medicine

## 2020-12-13 ENCOUNTER — Other Ambulatory Visit: Payer: Self-pay

## 2020-12-13 DIAGNOSIS — S22080S Wedge compression fracture of T11-T12 vertebra, sequela: Secondary | ICD-10-CM

## 2020-12-13 DIAGNOSIS — G894 Chronic pain syndrome: Secondary | ICD-10-CM

## 2020-12-13 DIAGNOSIS — M79604 Pain in right leg: Secondary | ICD-10-CM | POA: Diagnosis not present

## 2020-12-13 DIAGNOSIS — M5135 Other intervertebral disc degeneration, thoracolumbar region: Secondary | ICD-10-CM

## 2020-12-13 DIAGNOSIS — G8929 Other chronic pain: Secondary | ICD-10-CM

## 2020-12-13 DIAGNOSIS — M545 Low back pain, unspecified: Secondary | ICD-10-CM | POA: Diagnosis not present

## 2020-12-22 ENCOUNTER — Other Ambulatory Visit: Payer: Self-pay | Admitting: Family Medicine

## 2020-12-22 DIAGNOSIS — I129 Hypertensive chronic kidney disease with stage 1 through stage 4 chronic kidney disease, or unspecified chronic kidney disease: Secondary | ICD-10-CM

## 2020-12-22 DIAGNOSIS — N183 Chronic kidney disease, stage 3 unspecified: Secondary | ICD-10-CM

## 2020-12-26 ENCOUNTER — Other Ambulatory Visit: Payer: Self-pay | Admitting: Pain Medicine

## 2020-12-26 ENCOUNTER — Other Ambulatory Visit: Payer: Self-pay | Admitting: Family Medicine

## 2020-12-26 DIAGNOSIS — G894 Chronic pain syndrome: Secondary | ICD-10-CM

## 2020-12-26 DIAGNOSIS — K219 Gastro-esophageal reflux disease without esophagitis: Secondary | ICD-10-CM

## 2020-12-26 DIAGNOSIS — F419 Anxiety disorder, unspecified: Secondary | ICD-10-CM
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
## 2020-12-31 DIAGNOSIS — Z79891 Long term (current) use of opiate analgesic: Secondary | ICD-10-CM | POA: Insufficient documentation

## 2020-12-31 NOTE — Progress Notes (Deleted)
No show

## 2021-01-01 ENCOUNTER — Encounter: Payer: Medicare Other | Admitting: Pain Medicine

## 2021-01-01 DIAGNOSIS — M79604 Pain in right leg: Secondary | ICD-10-CM

## 2021-01-01 DIAGNOSIS — Z79899 Other long term (current) drug therapy: Secondary | ICD-10-CM

## 2021-01-01 DIAGNOSIS — G8929 Other chronic pain: Secondary | ICD-10-CM

## 2021-01-01 DIAGNOSIS — Z79891 Long term (current) use of opiate analgesic: Secondary | ICD-10-CM

## 2021-01-01 DIAGNOSIS — G894 Chronic pain syndrome: Secondary | ICD-10-CM

## 2021-01-01 DIAGNOSIS — S22080S Wedge compression fracture of T11-T12 vertebra, sequela: Secondary | ICD-10-CM

## 2021-01-01 DIAGNOSIS — M5135 Other intervertebral disc degeneration, thoracolumbar region: Secondary | ICD-10-CM

## 2021-01-01 DIAGNOSIS — M545 Low back pain, unspecified: Secondary | ICD-10-CM

## 2021-01-23 NOTE — Progress Notes (Signed)
PROVIDER NOTE: Information contained herein reflects review and annotations entered in association with encounter. Interpretation of such information and data should be left to medically-trained personnel. Information provided to patient can be located elsewhere in the medical record under "Patient Instructions". Document created using STT-dictation technology, any transcriptional errors that may result from process are unintentional.    Patient: Brittney Simpson  Service Category: E/M  Provider: Gaspar Cola, MD  DOB: 04-15-1928  DOS: 01/24/2021  Specialty: Interventional Pain Management  MRN: [id number]  Setting: Ambulatory outpatient  PCP: Olin Hauser, DO  Type: Established Patient    Referring Provider: Nobie Putnam *  Location: Office  Delivery: Face-to-face     HPI  Brittney Simpson, a 87 y.o. year old female, is here today because of her Chronic pain syndrome [G89.4]. Brittney Simpson primary complain today is Back Pain (Low towards right hip) Last encounter: My last encounter with her was on 01/01/2021. Pertinent problems: Brittney Simpson has Lumbar facet syndrome (Bilateral) (R>L); Chronic low back pain (1ry area of Pain) (Bilateral) (R>L) w/o sciatica; Spondylolisthesis of lumbosacral region (L2-3 and L5-S1); Lumbar spinal stenosis (9 mm at L3-4); Trochanteric bursitis (Right); Chronic hip pain (2ry area of Pain) (Right); Osteoarthritis of hip (Right); Chronic sacroiliac joint pain (Bilateral) (R>L); Chronic lumbar radicular pain (Right); Chronic lower extremity pain (3ry area of Pain) (Right); Myofascial pain; Spondylosis without myelopathy or radiculopathy, lumbosacral region; Ankle edema, bilateral; Abnormal MRI, lumbar spine (01/10/2017); T12 compression fracture, sequela; Lumbar facet hypertrophy; Lumbar lateral recess stenosis; Lumbar foraminal stenosis; Non-traumatic compression fracture of T7 thoracic vertebra, sequela; Chronic pain syndrome; DDD (degenerative  disc disease), lumbosacral; Edema of lower extremity; Somatic dysfunction of sacroiliac joints (Bilateral); Other spondylosis, sacral and sacrococcygeal region; Spasm of back muscles; and DDD (degenerative disc disease), thoracolumbar on their pertinent problem list. Pain Assessment: Severity of Chronic pain is reported as a 7 /10. Location: Back Lower/radiates into right hip. Onset: More than a month ago. Quality: Aching, Constant. Timing: Constant. Modifying factor(s): meds, procedures. Vitals:  height is $RemoveB'5\' 3"'RalEkztu$  (1.6 m) and weight is 114 lb (51.7 kg). Her temperature is 97 F (36.1 C) (abnormal). Her blood pressure is 186/75 (abnormal) and her pulse is 62. Her respiration is 16 and oxygen saturation is 99%.   Reason for encounter: medication management.   The patient indicates doing well with the current medication regimen. No adverse reactions or side effects reported to the medications.   In addition, the patient indicates that she is beginning to experience some of the low back pain and pain going around the right flank.  They would like to go ahead and have the epidural steroid injection repeated.  The last time that she had this done was on 11/28/2020.  RTCB: 04/24/2021 Nonopioids transferred 04/11/2020: Robaxin  Pharmacotherapy Assessment  Analgesic: Tramadol 50 mg, 1 tab PO q 8 hrs (150 mg/day of tramadol) MME/day: 15 mg/day.   Monitoring: [place] PMP: PDMP reviewed during this encounter.       Pharmacotherapy: No side-effects or adverse reactions reported. Compliance: No problems identified. Effectiveness: Clinically acceptable.  Brittney Shorter, Brittney Simpson  01/24/2021  3:16 PM  Sign when Signing Visit Nursing Pain Medication Assessment:  Safety precautions to be maintained throughout the outpatient stay will include: orient to surroundings, keep bed in low position, maintain call bell within reach at all times, provide assistance with transfer out of bed and ambulation.  Medication Inspection  Compliance: Pill count conducted under aseptic conditions, in front of the patient.  Neither the pills nor the bottle was removed from the patient's sight at any time. Once count was completed pills were immediately returned to the patient in their original bottle.  Medication: See above Pill/Patch Count:  25 of 90 pills remain Pill/Patch Appearance: Markings consistent with prescribed medication Bottle Appearance: Standard pharmacy container. Clearly labeled. Filled Date: 04 / 07 / 2022 Last Medication intake:  Today    UDS:  Summary  Date Value Ref Range Status  10/28/2017 FINAL  Final    Comment:    ==================================================================== TOXASSURE SELECT 13 (MW) ==================================================================== Test                             Result       Flag       Units Drug Present and Declared for Prescription Verification   Tramadol                       >20000       EXPECTED   ng/mg creat   O-Desmethyltramadol            >20000       EXPECTED   ng/mg creat   N-Desmethyltramadol            8244         EXPECTED   ng/mg creat    Source of tramadol is a prescription medication.    O-desmethyltramadol and N-desmethyltramadol are expected    metabolites of tramadol. Drug Present not Declared for Prescription Verification   7-aminoclonazepam              248          UNEXPECTED ng/mg creat    7-aminoclonazepam is an expected metabolite of clonazepam. Source    of clonazepam is a scheduled prescription medication. ==================================================================== Test                      Result    Flag   Units      Ref Range   Creatinine              25               mg/dL      >=20 ==================================================================== Declared Medications:  The flagging and interpretation on this report are based on the  following declared medications.  Unexpected results may arise from   inaccuracies in the declared medications.  **Note: The testing scope of this panel includes these medications:  Tramadol  **Note: The testing scope of this panel does not include following  reported medications:  Acetaminophen  Amlodipine  Brimonidine Tartrate  Cranberry Extract  Furosemide  Latanoprost  Levothyroxine  Lidocaine  Lisinopril  Metoprolol  Omeprazole  Timolol  Trazodone  Vitamin D2 ==================================================================== For clinical consultation, please call [phone]. ====================================================================      ROS  Constitutional: Denies any fever or chills Gastrointestinal: No reported hemesis, hematochezia, vomiting, or acute GI distress Musculoskeletal: Denies any acute onset joint swelling, redness, loss of ROM, or weakness Neurological: No reported episodes of acute onset apraxia, aphasia, dysarthria, agnosia, amnesia, paralysis, loss of coordination, or loss of consciousness  Medication Review  Cranberry, Melatonin, Vitamin D2, acetaminophen, amLODipine, clonazePAM, furosemide, latanoprost, levothyroxine, lisinopril, metoprolol succinate, omeprazole, sulfamethoxazole-trimethoprim, tamsulosin, traMADol, and traZODone  History Review  Allergy: Ms. Mcquire has No Known Allergies. Drug: Ms. Quarry  reports no history of drug use. Alcohol:  reports  no history of alcohol use. Tobacco:  reports that she has never smoked. She has never used smokeless tobacco. Social: Ms. Sheaffer  reports that she has never smoked. She has never used smokeless tobacco. She reports that she does not drink alcohol and does not use drugs. Medical:  has a past medical history of Anxiety, Back ache, Hiatal hernia, Hyperlipidemia, Hypertension, Hypothyroid, Osteopenia, Thyroid disease, and UTI (urinary tract infection). Surgical: Ms. Kohlman  has a past surgical history that includes Hernia repair; Hernia repair;  Hemorrhoid surgery; Kyphoplasty (N/A, 01/23/2017); and Kyphoplasty (N/A, 01/25/2020). Family: family history includes Cancer in her father; Stroke in her mother.  Laboratory Chemistry Profile   Renal Lab Results  Component Value Date   BUN 8 05/16/2020   CREATININE 0.79 33/54/5625   BCR NOT APPLICABLE 63/89/3734   GFRAA 76 05/16/2020   GFRNONAA 66 05/16/2020    Hepatic Lab Results  Component Value Date   AST 13 05/16/2020   ALT 9 05/16/2020   ALBUMIN 3.6 11/22/2016   ALKPHOS 59 11/22/2016   LIPASE 21 06/14/2016    Electrolytes Lab Results  Component Value Date   NA 136 05/16/2020   K 4.3 05/16/2020   CL 98 05/16/2020   CALCIUM 9.7 05/16/2020   MG 2.1 01/14/2014    Bone No results found for: VD25OH, VD125OH2TOT, KA7681LX7, WI2035DH7, 25OHVITD1, 25OHVITD2, 25OHVITD3, TESTOFREE, TESTOSTERONE  Inflammation (CRP: Acute Phase) (ESR: Chronic Phase) No results found for: CRP, ESRSEDRATE, LATICACIDVEN        Note: Above Lab results reviewed.  Recent Imaging Review  DG PAIN CLINIC C-ARM 1-60 MIN NO REPORT Fluoro was used, but no Radiologist interpretation will be provided.  Please refer to "NOTES" tab for provider progress note. Note: Reviewed        Physical Exam  General appearance: Well nourished, well developed, and well hydrated. In no apparent acute distress Mental status: Alert, oriented x 3 (person, place, & time)       Respiratory: No evidence of acute respiratory distress Eyes: PERLA Vitals: BP (!) 186/75   Pulse 62   Temp (!) 97 F (36.1 C)   Resp 16   Ht $R'5\' 3"'dI$  (1.6 m)   Wt 114 lb (51.7 kg)   SpO2 99%   BMI 20.19 kg/m  BMI: Estimated body mass index is 20.19 kg/m as calculated from the following:   Height as of this encounter: $RemoveBeforeD'5\' 3"'bHGePuVvZtQGqQ$  (1.6 m).   Weight as of this encounter: 114 lb (51.7 kg). Ideal: Ideal body weight: 52.4 kg (115 lb 8.3 oz)  Assessment   Status Diagnosis  Controlled Controlled Controlled 1. Chronic pain syndrome   2. Chronic low back  pain (1ry area of Pain) (Bilateral) (R>L) w/o sciatica   3. Chronic hip pain (2ry area of Pain) (Right)   4. Chronic lower extremity pain (3ry area of Pain) (Right)   5. DDD (degenerative disc disease), lumbosacral   6. Lumbar facet syndrome (Bilateral) (R>L)   7. Pharmacologic therapy   8. Chronic use of opiate for therapeutic purpose   9. Encounter for medication management   10. T12 compression fracture, sequela   11. Spondylolisthesis of lumbosacral region (L2-3 and L5-S1)      Updated Problems: No problems updated.   Plan of Care  Problem-specific:  No problem-specific Assessment & Plan notes found for this encounter.  Ms. ARUNA NESTLER has a current medication list which includes the following long-term medication(s): amlodipine, clonazepam, furosemide, levothyroxine, lisinopril, metoprolol succinate, omeprazole, trazodone, and tramadol.  Pharmacotherapy (  Medications Ordered): Meds ordered this encounter  Medications   traMADol (ULTRAM) 50 MG tablet    Sig: Take 1 tablet (50 mg total) by mouth 3 (three) times daily.    Dispense:  90 tablet    Refill:  2    Not a duplicate. Do NOT delete! Dispense 1 day early if closed on fill date. Warn not to take CNS-depressants 8 hours before or after taking opioid. Do not send refill request. Renewal requires appointment.    Orders:  Orders Placed This Encounter  Procedures   Lumbar Epidural Injection    Standing Status:   Future    Standing Expiration Date:   02/23/2021    Scheduling Instructions:     Procedure: Interlaminar Lumbar Epidural Steroid injection (LESI)  T12-L1     Laterality: Right-sided     Sedation: Patient's choice.     Timeframe: ASAA    Order Specific Question:   Where will this procedure be performed?    Answer:   [facility]   ToxASSURE Select 13 (MW), Urine    Volume: 30 ml(s). Minimum 3 ml of urine is needed. Document temperature of fresh sample. Indications: Long term (current) use of  opiate analgesic (P49.826)    Order Specific Question:   Release to patient    Answer:   Immediate    Follow-up plan:   Return for Procedure (no sedation): (R) T12-L1 LESI.     Interventional Therapies  Risk  Complexity Considerations:   Estimated body mass index is 20.19 kg/m as calculated from the following:   Height as of this encounter: $RemoveBeforeD'5\' 3"'dKPTIrGRqPzzio$  (1.6 m).   Weight as of this encounter: 114 lb (51.7 kg). Note: Hard of hearing   Planned  Pending:   Pending further evaluation   Under consideration:   Possible bilateral lumbar facet RFA. (Offerred several times. Pt. chooses to hold.) Diagnostic right hip joint injection  Diagnostic right L3 TFESI  Diagnostic right trochanteric bursa injection  Diagnostic right SI joint block  Diagnostic midline caudal ESI (for the tailbone pain)   Completed:   Palliative left T12-L1 LESI x3 (11/28/2020)  Therapeutic right L2-3 LESI x1 (03/07/2020)  Diagnostic/therapeutic bilateral SI joint injection x1 (02/10/2020)  Palliative right lumbar facet block x13 (01/04/2020)  Palliative left lumbar facet block x11 (01/04/2020)    Therapeutic  Palliative (PRN) options:   Palliative left T12-L1 LESI #4  Palliative right lumbar facet block #14  Palliative left lumbar facet block #12      Recent Visits Date Type Provider Dept  12/13/20 Telemedicine Milinda Pointer, MD [facility]  11/28/20 Procedure visit Milinda Pointer, MD [facility]  Showing recent visits within past 90 days and meeting all other requirements Today's Visits Date Type Provider Dept  01/24/21 Office Visit Milinda Pointer, MD [facility]  Showing today's visits and meeting all other requirements Future Appointments Date Type Provider Dept  02/06/21 Appointment Milinda Pointer, MD [facility]  04/18/21 Appointment Milinda Pointer, MD [facility]  Showing future appointments within next 90 days and meeting all other  requirements I discussed the assessment and treatment plan with the patient. The patient was provided an opportunity to ask questions and all were answered. The patient agreed with the plan and demonstrated an understanding of the instructions.  Patient advised to call back or seek an in-person evaluation if the symptoms or condition worsens.  Duration of encounter: 30 minutes.  Note by: Gaspar Cola, MD Date: 01/24/2021; Time: 4:11  PM

## 2021-01-24 ENCOUNTER — Other Ambulatory Visit: Payer: Self-pay

## 2021-01-24 ENCOUNTER — Encounter: Payer: Self-pay | Admitting: Pain Medicine

## 2021-01-24 ENCOUNTER — Other Ambulatory Visit: Payer: Self-pay | Admitting: Pain Medicine

## 2021-01-24 ENCOUNTER — Ambulatory Visit: Payer: Medicare Other | Attending: Pain Medicine | Admitting: Pain Medicine

## 2021-01-24 VITALS — BP 186/75 | HR 62 | Temp 97.0°F | Resp 16 | Ht 63.0 in | Wt 114.0 lb

## 2021-01-24 DIAGNOSIS — G894 Chronic pain syndrome: Secondary | ICD-10-CM | POA: Diagnosis not present

## 2021-01-24 DIAGNOSIS — S22080S Wedge compression fracture of T11-T12 vertebra, sequela: Secondary | ICD-10-CM | POA: Diagnosis not present

## 2021-01-24 DIAGNOSIS — M79604 Pain in right leg: Secondary | ICD-10-CM | POA: Diagnosis not present

## 2021-01-24 DIAGNOSIS — G8929 Other chronic pain: Secondary | ICD-10-CM

## 2021-01-24 DIAGNOSIS — M25551 Pain in right hip: Secondary | ICD-10-CM | POA: Diagnosis not present

## 2021-01-24 DIAGNOSIS — M5137 Other intervertebral disc degeneration, lumbosacral region: Secondary | ICD-10-CM | POA: Diagnosis not present

## 2021-01-24 DIAGNOSIS — M47816 Spondylosis without myelopathy or radiculopathy, lumbar region: Secondary | ICD-10-CM

## 2021-01-24 DIAGNOSIS — M4317 Spondylolisthesis, lumbosacral region: Secondary | ICD-10-CM | POA: Diagnosis not present

## 2021-01-24 DIAGNOSIS — M51379 Other intervertebral disc degeneration, lumbosacral region without mention of lumbar back pain or lower extremity pain: Secondary | ICD-10-CM

## 2021-01-24 DIAGNOSIS — Z79891 Long term (current) use of opiate analgesic: Secondary | ICD-10-CM | POA: Diagnosis not present

## 2021-01-24 DIAGNOSIS — M545 Low back pain, unspecified: Secondary | ICD-10-CM | POA: Diagnosis not present

## 2021-01-24 DIAGNOSIS — Z79899 Other long term (current) drug therapy: Secondary | ICD-10-CM

## 2021-01-24 MED ORDER — TRAMADOL HCL 50 MG PO TABS: 50.0000 mg | ORAL_TABLET | Freq: Three times a day (TID) | ORAL | 2 refills | Status: DC

## 2021-01-24 NOTE — Patient Instructions (Addendum)
____________________________________________________________________________________________  Preparing for your procedure (without sedation)  Procedure appointments are limited to planned procedures: No Prescription Refills. No disability issues will be discussed. No medication changes will be discussed.  Instructions: Oral Intake: Do not eat or drink anything for at least 6 hours prior to your procedure. (Exception: Blood Pressure Medication. See below.) Transportation: Unless otherwise stated by your physician, you may drive yourself after the procedure. Blood Pressure Medicine: Do not forget to take your blood pressure medicine with a sip of water the morning of the procedure. If your Diastolic (lower reading)is above 100 mmHg, elective cases will be cancelled/rescheduled. Blood thinners: These will need to be stopped for procedures. Notify our staff if you are taking any blood thinners. Depending on which one you take, there will be specific instructions on how and when to stop it. Diabetics on insulin: Notify the staff so that you can be scheduled 1st case in the morning. If your diabetes requires high dose insulin, take only  of your normal insulin dose the morning of the procedure and notify the staff that you have done so. Preventing infections: Shower with an antibacterial soap the morning of your procedure.  Build-up your immune system: Take 1000 mg of Vitamin C with every meal (3 times a day) the day prior to your procedure. Antibiotics: Inform the staff if you have a condition or reason that requires you to take antibiotics before dental procedures. Pregnancy: If you are pregnant, call and cancel the procedure. Sickness: If you have a cold, fever, or any active infections, call and cancel the procedure. Arrival: You must be in the facility at least 30 minutes prior to your scheduled procedure. Children: Do not bring any children with you. Dress appropriately: Bring dark clothing  that you would not mind if they get stained. Valuables: Do not bring any jewelry or valuables.  Reasons to call and reschedule or cancel your procedure: (Following these recommendations will minimize the risk of a serious complication.) Surgeries: Avoid having procedures within 2 weeks of any surgery. (Avoid for 2 weeks before or after any surgery). Flu Shots: Avoid having procedures within 2 weeks of a flu shots or . (Avoid for 2 weeks before or after immunizations). Barium: Avoid having a procedure within 7-10 days after having had a radiological study involving the use of radiological contrast. (Myelograms, Barium swallow or enema study). Heart attacks: Avoid any elective procedures or surgeries for the initial 6 months after a "Myocardial Infarction" (Heart Attack). Blood thinners: It is imperative that you stop these medications before procedures. Let us know if you if you take any blood thinner.  Infection: Avoid procedures during or within two weeks of an infection (including chest colds or gastrointestinal problems). Symptoms associated with infections include: Localized redness, fever, chills, night sweats or profuse sweating, burning sensation when voiding, cough, congestion, stuffiness, runny nose, sore throat, diarrhea, nausea, vomiting, cold or Flu symptoms, recent or current infections. It is specially important if the infection is over the area that we intend to treat. Heart and lung problems: Symptoms that may suggest an active cardiopulmonary problem include: cough, chest pain, breathing difficulties or shortness of breath, dizziness, ankle swelling, uncontrolled high or unusually low blood pressure, and/or palpitations. If you are experiencing any of these symptoms, cancel your procedure and contact your primary care physician for an evaluation.  Remember:  Regular Business hours are:  Monday to Thursday 8:00 AM to 4:00 PM  Provider's Schedule: Milinda Pointer, MD:  Procedure  days: Tuesday and  Thursday 7:30 AM to 4:00 PM  Gillis Santa, MD:  Procedure days: Monday and Wednesday 7:30 AM to 4:00 PM ____________________________________________________________________________________________  ____________________________________________________________________________________________  Medication Rules  Purpose: To inform patients, and their family members, of our rules and regulations.  Applies to: All patients receiving prescriptions (written or electronic).  Pharmacy of record: Pharmacy where electronic prescriptions will be sent. If written prescriptions are taken to a different pharmacy, please inform the nursing staff. The pharmacy listed in the electronic medical record should be the one where you would like electronic prescriptions to be sent.  Electronic prescriptions: In compliance with the [place] (STOP) Act of 2017 (Session Lanny Cramp [phone]), effective July 29, 2018, all controlled substances must be electronically prescribed. Calling prescriptions to the pharmacy will cease to exist.  Prescription refills: Only during scheduled appointments. Applies to all prescriptions.  NOTE: The following applies primarily to controlled substances (Opioid* Pain Medications).   Type of encounter (visit): For patients receiving controlled substances, face-to-face visits are required. (Not an option or up to the patient.)  Patient's responsibilities: Pain Pills: Bring all pain pills to every appointment (except for procedure appointments). Pill Bottles: Bring pills in original pharmacy bottle. Always bring the newest bottle. Bring bottle, even if empty. Medication refills: You are responsible for knowing and keeping track of what medications you take and those you need refilled. The day before your appointment: write a list of all prescriptions that need to be refilled. The day of the appointment: give the list to the  admitting nurse. Prescriptions will be written only during appointments. No prescriptions will be written on procedure days. If you forget a medication: it will not be "Called in", "Faxed", or "electronically sent". You will need to get another appointment to get these prescribed. No early refills. Do not call asking to have your prescription filled early. Prescription Accuracy: You are responsible for carefully inspecting your prescriptions before leaving our office. Have the discharge nurse carefully go over each prescription with you, before taking them home. Make sure that your name is accurately spelled, that your address is correct. Check the name and dose of your medication to make sure it is accurate. Check the number of pills, and the written instructions to make sure they are clear and accurate. Make sure that you are given enough medication to last until your next medication refill appointment. Taking Medication: Take medication as prescribed. When it comes to controlled substances, taking less pills or less frequently than prescribed is permitted and encouraged. Never take more pills than instructed. Never take medication more frequently than prescribed.  Inform other Doctors: Always inform, all of your healthcare providers, of all the medications you take. Pain Medication from other Providers: You are not allowed to accept any additional pain medication from any other Doctor or Healthcare provider. There are two exceptions to this rule. (see below) In the event that you require additional pain medication, you are responsible for notifying us, as stated below. Cough Medicine: Often these contain an opioid, such as codeine or hydrocodone. Never accept or take cough medicine containing these opioids if you are already taking an opioid* medication. The combination may cause respiratory failure and death. Medication Agreement: You are responsible for carefully reading and following our Medication  Agreement. This must be signed before receiving any prescriptions from our practice. Safely store a copy of your signed Agreement. Violations to the Agreement will result in no further prescriptions. (Additional copies of our Medication Agreement are available  upon request.) Laws, Rules, & Regulations: All patients are expected to follow all Federal and [employer], [employer], Rules, [employer] [facility]. Ignorance of the Laws does not constitute a valid excuse.  Illegal drugs and Controlled Substances: The use of illegal substances (including, but not limited to marijuana and its derivatives) and/or the illegal use of any controlled substances is strictly prohibited. Violation of this rule may result in the immediate and permanent discontinuation of any and all prescriptions being written by our practice. The use of any illegal substances is prohibited. Adopted CDC guidelines & recommendations: Target dosing levels will be at or below 60 MME/day. Use of benzodiazepines** is not recommended.  Exceptions: There are only two exceptions to the rule of not receiving pain medications from other Healthcare Providers. Exception #1 (Emergencies): In the event of an emergency (i.e.: accident requiring emergency care), you are allowed to receive additional pain medication. However, you are responsible for: As soon as you are able, call our office [phone], at any time of the day or night, and leave a message stating your name, the date and nature of the emergency, and the name and dose of the medication prescribed. In the event that your call is answered by a member of our staff, make sure to document and save the date, time, and the name of the person that took your information.  Exception #2 (Planned Surgery): In the event that you are scheduled by another doctor or dentist to have any type of surgery or procedure, you are allowed (for a period no longer than 30 days), to receive additional pain medication, for the  acute post-op pain. However, in this case, you are responsible for picking up a copy of our "Post-op Pain Management for Surgeons" handout, and giving it to your surgeon or dentist. This document is available at our office, and does not require an appointment to obtain it. Simply go to our office during business hours (Monday-Thursday from 8:00 AM to 4:00 PM) (Friday 8:00 AM to 12:00 Noon) or if you have a scheduled appointment with [place], prior to your surgery, and ask for it by name. In addition, you are responsible for: calling our office [phone], at any time of the day or night, and leaving a message stating your name, name of your surgeon, type of surgery, and date of procedure or surgery. Failure to comply with your responsibilities may result in termination of therapy involving the controlled substances.  *Opioid medications include: morphine, codeine, oxycodone, oxymorphone, hydrocodone, hydromorphone, meperidine, tramadol, tapentadol, buprenorphine, fentanyl, methadone. **Benzodiazepine medications include: diazepam (Valium), alprazolam (Xanax), clonazepam (Klonopine), lorazepam (Ativan), clorazepate (Tranxene), chlordiazepoxide (Librium), estazolam (Prosom), oxazepam (Serax), temazepam (Restoril), triazolam (Halcion) (Last updated: 06/26/2020) ____________________________________________________________________________________________  ____________________________________________________________________________________________  Medication Recommendations and Reminders  Applies to: All patients receiving prescriptions (written and/or electronic).  Medication Rules & Regulations: These rules and regulations exist for your safety and that of others. They are not flexible and neither are we. Dismissing or ignoring them will be considered "non-compliance" with medication therapy, resulting in complete and irreversible termination of such therapy. (See document titled "Medication Rules" for more  details.) In all conscience, because of safety reasons, we cannot continue providing a therapy where the patient does not follow instructions.  Pharmacy of record:  Definition: This is the pharmacy where your electronic prescriptions will be sent.  We do not endorse any particular pharmacy, however, we have experienced problems with [facility] not securing enough medication supply for the community. We do not restrict you in  your choice of pharmacy. However, once we write for your prescriptions, we will NOT be re-sending more prescriptions to fix restricted supply problems created by your pharmacy, or your insurance.  The pharmacy listed in the electronic medical record should be the one where you want electronic prescriptions to be sent. If you choose to change pharmacy, simply notify our nursing staff.  Recommendations: Keep all of your pain medications in a safe place, under lock and key, even if you live alone. We will NOT replace lost, stolen, or damaged medication. After you fill your prescription, take 1 week's worth of pills and put them away in a safe place. You should keep a separate, properly labeled bottle for this purpose. The remainder should be kept in the original bottle. Use this as your primary supply, until it runs out. Once it's gone, then you know that you have 1 week's worth of medicine, and it is time to come in for a prescription refill. If you do this correctly, it is unlikely that you will ever run out of medicine. To make sure that the above recommendation works, it is very important that you make sure your medication refill appointments are scheduled at least 1 week before you run out of medicine. To do this in an effective manner, make sure that you do not leave the office without scheduling your next medication management appointment. Always ask the nursing staff to show you in your prescription , when your medication will be running out. Then arrange for the receptionist to  get you a return appointment, at least 7 days before you run out of medicine. Do not wait until you have 1 or 2 pills left, to come in. This is very poor planning and does not take into consideration that we may need to cancel appointments due to bad weather, sickness, or emergencies affecting our staff. DO NOT ACCEPT A "Partial Fill": If for any reason your pharmacy does not have enough pills/tablets to completely fill or refill your prescription, do not allow for a "partial fill". The law allows the pharmacy to complete that prescription within 72 hours, without requiring a new prescription. If they do not fill the rest of your prescription within those 72 hours, you will need a separate prescription to fill the remaining amount, which we will NOT provide. If the reason for the partial fill is your insurance, you will need to talk to the pharmacist about payment alternatives for the remaining tablets, but again, DO NOT ACCEPT A PARTIAL FILL, unless you can trust your pharmacist to obtain the remainder of the pills within 72 hours.  Prescription refills and/or changes in medication(s):  Prescription refills, and/or changes in dose or medication, will be conducted only during scheduled medication management appointments. (Applies to both, written and electronic prescriptions.) No refills on procedure days. No medication will be changed or started on procedure days. No changes, adjustments, and/or refills will be conducted on a procedure day. Doing so will interfere with the diagnostic portion of the procedure. No phone refills. No medications will be "called into the pharmacy". No Fax refills. No weekend refills. No Holliday refills. No after hours refills.  Remember:  Business hours are:  Monday to Thursday 8:00 AM to 4:00 PM Provider's Schedule: Milinda Pointer, MD - Appointments are:  Medication management: Monday and Wednesday 8:00 AM to 4:00 PM Procedure day: Tuesday and Thursday 7:30 AM to  4:00 PM Gillis Santa, MD - Appointments are:  Medication management: Tuesday and Thursday 8:00 AM  to 4:00 PM Procedure day: Monday and Wednesday 7:30 AM to 4:00 PM (Last update: 02/16/2020) ____________________________________________________________________________________________  ____________________________________________________________________________________________  CBD (cannabidiol) WARNING  Applicable to: All individuals currently taking or considering taking CBD (cannabidiol) and, more important, all patients taking opioid analgesic controlled substances (pain medication). (Example: oxycodone; oxymorphone; hydrocodone; hydromorphone; morphine; methadone; tramadol; tapentadol; fentanyl; buprenorphine; butorphanol; dextromethorphan; meperidine; codeine; etc.)  Legal status: CBD remains a Schedule I drug prohibited for any use. CBD is illegal with one exception. In the [place], CBD has a limited Transport planner (FDA) approval for the treatment of two specific types of epilepsy disorders. Only one CBD product has been approved by the FDA for this purpose: "Epidiolex". FDA is aware that some companies are marketing products containing cannabis and cannabis-derived compounds in ways that violate the [employer], Drug and Cosmetic Act [facility] Act) and that may put the health and safety of consumers at risk. The FDA, a Federal agency, has not enforced the CBD status since 2018.   Legality: Some manufacturers ship CBD products nationally, which is illegal. Often such products are sold online and are therefore available throughout the country. CBD is openly sold in head shops and health food stores in some states where such sales have not been explicitly legalized. Selling unapproved products with unsubstantiated therapeutic claims is not only a violation of the law, but also can put patients at risk, as these products have not been proven to be safe or effective. Federal illegality  makes it difficult to conduct research on CBD.  Reference: "FDA Regulation of Cannabis and Cannabis-Derived Products, Including Cannabidiol (CBD)" - [employer]  Warning: CBD is not FDA approved and has not undergo the same manufacturing controls as prescription drugs.  This means that the purity and safety of available CBD may be questionable. Most of the time, despite manufacturer's claims, it is contaminated with THC (delta-9-tetrahydrocannabinol - the chemical in marijuana responsible for the "HIGH").  When this is the case, the [facility] contaminant will trigger a positive urine drug screen (UDS) test for Marijuana (carboxy-THC). Because a positive UDS for any illicit substance is a violation of our medication agreement, your opioid analgesics (pain medicine) may be permanently discontinued.  MORE ABOUT CBD  General Information: CBD  is a derivative of the Marijuana (cannabis sativa) plant discovered in 69. It is one of the 113 identified substances found in Marijuana. It accounts for up to 40% of the plant's extract. As of 2018, preliminary clinical studies on CBD included research for the treatment of anxiety, movement disorders, and pain. CBD is available and consumed in multiple forms, including inhalation of smoke or vapor, as an aerosol spray, and by mouth. It may be supplied as an oil containing CBD, capsules, dried cannabis, or as a liquid solution. CBD is thought not to be as psychoactive as THC (delta-9-tetrahydrocannabinol - the chemical in marijuana responsible for the "HIGH"). Studies suggest that CBD may interact with different biological target receptors in the body, including cannabinoid and other neurotransmitter receptors. As of 2018 the mechanism of action for its biological effects has not been determined.  Side-effects  Adverse reactions: Dry mouth, diarrhea,  decreased appetite, fatigue, drowsiness, malaise, weakness, sleep disturbances, and others.  Drug interactions: CBC may interact with other medications such as blood-thinners. (Last update: 03/04/2020) ____________________________________________________________________________________________  ____________________________________________________________________________________________  Drug Holidays (Slow)  What is a "Drug Holiday"? Drug Holiday: is the name given to the period of time during which a patient stops taking a medication(s) for the purpose  of eliminating tolerance to the drug.  Benefits Improved effectiveness of opioids. Decreased opioid dose needed to achieve benefits. Improved pain with lesser dose.  What is tolerance? Tolerance: is the progressive decreased in effectiveness of a drug due to its repetitive use. With repetitive use, the body gets use to the medication and as a consequence, it loses its effectiveness. This is a common problem seen with opioid pain medications. As a result, a larger dose of the drug is needed to achieve the same effect that used to be obtained with a smaller dose.  How long should a "Drug Holiday" last? You should stay off of the pain medicine for at least 14 consecutive days. (2 weeks)  Should I stop the medicine "cold [place]"? No. You should always coordinate with your Pain Specialist so that he/she can provide you with the correct medication dose to make the transition as smoothly as possible.  How do I stop the medicine? Slowly. You will be instructed to decrease the daily amount of pills that you take by one (1) pill every seven (7) days. This is called a "slow downward taper" of your dose. For example: if you normally take four (4) pills per day, you will be asked to drop this dose to three (3) pills per day for seven (7) days, then to two (2) pills per day for seven (7) days, then to one (1) per day for seven (7) days, and at the end of  those last seven (7) days, this is when the "Drug Holiday" would start.   Will I have withdrawals? By doing a "slow downward taper" like this one, it is unlikely that you will experience any significant withdrawal symptoms. Typically, what triggers withdrawals is the sudden stop of a high dose opioid therapy. Withdrawals can usually be avoided by slowly decreasing the dose over a prolonged period of time. If you do not follow these instructions and decide to stop your medication abruptly, withdrawals may be possible.  What are withdrawals? Withdrawals: refers to the wide range of symptoms that occur after stopping or dramatically reducing opiate drugs after heavy and prolonged use. Withdrawal symptoms do not occur to patients that use low dose opioids, or those who take the medication sporadically. Contrary to benzodiazepine (example: Valium, Xanax, etc.) or alcohol withdrawals ("Delirium Tremens"), opioid withdrawals are not lethal. Withdrawals are the physical manifestation of the body getting rid of the excess receptors.  Expected Symptoms Early symptoms of withdrawal may include: Agitation Anxiety Muscle aches Increased tearing Insomnia Runny nose Sweating Yawning  Late symptoms of withdrawal may include: Abdominal cramping Diarrhea Dilated pupils Goose bumps Nausea Vomiting  Will I experience withdrawals? Due to the slow nature of the taper, it is very unlikely that you will experience any.  What is a slow taper? Taper: refers to the gradual decrease in dose.  (Last update: 02/16/2020) ____________________________________________________________________________________________   ____________________________________________________________________________________________  General Risks and Possible Complications  Patient Responsibilities: It is important that you read this as it is part of your informed consent. It is our duty to inform you of the risks and possible  complications associated with treatments offered to you. It is your responsibility as a patient to read this and to ask questions about anything that is not clear or that you believe was not covered in this document.  Patient's Rights: You have the right to refuse treatment. You also have the right to change your mind, even after initially having agreed to have the treatment done. However, under  this last option, if you wait until the last second to change your mind, you may be charged for the materials used up to that point.  Introduction: Medicine is not an Chief Strategy Officer. Everything in Medicine, including the lack of treatment(s), carries the potential for danger, harm, or loss (which is by definition: Risk). In Medicine, a complication is a secondary problem, condition, or disease that can aggravate an already existing one. All treatments carry the risk of possible complications. The fact that a side effects or complications occurs, does not imply that the treatment was conducted incorrectly. It must be clearly understood that these can happen even when everything is done following the highest safety standards.  No treatment: You can choose not to proceed with the proposed treatment alternative. The "PRO(s)" would include: avoiding the risk of complications associated with the therapy. The "CON(s)" would include: not getting any of the treatment benefits. These benefits fall under one of three categories: diagnostic; therapeutic; and/or palliative. Diagnostic benefits include: getting information which can ultimately lead to improvement of the disease or symptom(s). Therapeutic benefits are those associated with the successful treatment of the disease. Finally, palliative benefits are those related to the decrease of the primary symptoms, without necessarily curing the condition (example: decreasing the pain from a flare-up of a chronic condition, such as incurable terminal cancer).  General Risks and  Complications: These are associated to most interventional treatments. They can occur alone, or in combination. They fall under one of the following six (6) categories: no benefit or worsening of symptoms; bleeding; infection; nerve damage; allergic reactions; and/or death. No benefits or worsening of symptoms: In Medicine there are no guarantees, only probabilities. No healthcare provider can ever guarantee that a medical treatment will work, they can only state the probability that it may. Furthermore, there is always the possibility that the condition may worsen, either directly, or indirectly, as a consequence of the treatment. Bleeding: This is more common if the patient is taking a blood thinner, either prescription or over the counter (example: Goody Powders, Fish oil, Aspirin, Garlic, etc.), or if suffering a condition associated with impaired coagulation (example: Hemophilia, cirrhosis of the liver, low platelet counts, etc.). However, even if you do not have one on these, it can still happen. If you have any of these conditions, or take one of these drugs, make sure to notify your treating physician. Infection: This is more common in patients with a compromised immune system, either due to disease (example: diabetes, cancer, human immunodeficiency virus [HIV], etc.), or due to medications or treatments (example: therapies used to treat cancer and rheumatological diseases). However, even if you do not have one on these, it can still happen. If you have any of these conditions, or take one of these drugs, make sure to notify your treating physician. Nerve Damage: This is more common when the treatment is an invasive one, but it can also happen with the use of medications, such as those used in the treatment of cancer. The damage can occur to small secondary nerves, or to large primary ones, such as those in the spinal cord and brain. This damage may be temporary or permanent and it may lead to  impairments that can range from temporary numbness to permanent paralysis and/or brain death. Allergic Reactions: Any time a substance or material comes in contact with our body, there is the possibility of an allergic reaction. These can range from a mild skin rash (contact dermatitis) to a severe systemic reaction (  anaphylactic reaction), which can result in death. Death: In general, any medical intervention can result in death, most of the time due to an unforeseen complication. ____________________________________________________________________________________________ Preparing for your procedure (without sedation) Instructions: Oral Intake: Do not eat or drink anything for at least 3 hours prior to your procedure. Transportation: Unless otherwise stated by your physician, you may drive yourself after the procedure. Blood Pressure Medicine: Take your blood pressure medicine with a sip of water the morning of the procedure. Insulin: Take only  of your normal insulin dose. Preventing infections: Shower with an antibacterial soap the morning of your procedure. Build-up your immune system: Take 1000 mg of Vitamin C with every meal (3 times a day) the day prior to your procedure. Pregnancy: If you are pregnant, call and cancel the procedure. Sickness: If you have a cold, fever, or any active infections, call and cancel the procedure. Arrival: You must be in the facility at least 30 minutes prior to your scheduled procedure. Children: Do not bring any children with you. Dress appropriately: Bring dark clothing that you would not mind if they get stained. Valuables: Do not bring any jewelry or valuables. Procedure appointments are reserved for interventional treatments only. No Prescription Refills. No medication changes will be discussed during procedure appointments. No disability issues will be discussed.

## 2021-01-24 NOTE — Progress Notes (Signed)
Nursing Pain Medication Assessment:  Safety precautions to be maintained throughout the outpatient stay will include: orient to surroundings, keep bed in low position, maintain call bell within reach at all times, provide assistance with transfer out of bed and ambulation.  Medication Inspection Compliance: Pill count conducted under aseptic conditions, in front of the patient. Neither the pills nor the bottle was removed from the patient's sight at any time. Once count was completed pills were immediately returned to the patient in their original bottle.  Medication: See above Pill/Patch Count:  25 of 90 pills remain Pill/Patch Appearance: Markings consistent with prescribed medication Bottle Appearance: Standard pharmacy container. Clearly labeled. Filled Date: 04 / 07 / 2022 Last Medication intake:  Today

## 2021-01-26 ENCOUNTER — Other Ambulatory Visit: Payer: Self-pay | Admitting: Family Medicine

## 2021-01-26 DIAGNOSIS — I129 Hypertensive chronic kidney disease with stage 1 through stage 4 chronic kidney disease, or unspecified chronic kidney disease: Secondary | ICD-10-CM

## 2021-01-26 DIAGNOSIS — E034 Atrophy of thyroid (acquired): Secondary | ICD-10-CM
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
## 2021-01-28 ENCOUNTER — Other Ambulatory Visit: Payer: Self-pay | Admitting: Family Medicine

## 2021-01-28 DIAGNOSIS — I129 Hypertensive chronic kidney disease with stage 1 through stage 4 chronic kidney disease, or unspecified chronic kidney disease: Secondary | ICD-10-CM

## 2021-01-28 NOTE — Telephone Encounter (Signed)
Requested too soon/// last RF 01/26/21 #90

## 2021-02-01 LAB — TOXASSURE SELECT 13 (MW), URINE

## 2021-02-05 NOTE — Progress Notes (Signed)
PROVIDER NOTE: Information contained herein reflects review and annotations entered in association with encounter. Interpretation of such information and data should be left to medically-trained personnel. Information provided to patient can be located elsewhere in the medical record under "Patient Instructions". Document created using STT-dictation technology, any transcriptional errors that may result from process are unintentional.    Patient: Brittney Simpson  Service Category: Procedure  Provider: Gaspar Cola, MD  DOB: 02/06/1928  DOS: 02/06/2021  Location: [facility]  MRN: [id number]  Setting: Ambulatory - outpatient  Referring Provider: Nobie Putnam *  Type: Established Patient  Specialty: Interventional Pain Management  PCP: Olin Hauser, DO   Primary Reason for Visit: Interventional Pain Management Treatment. CC: Back Pain (lower)  Procedure:          Anesthesia, Analgesia, Anxiolysis:  Type: Therapeutic Inter-Laminar Thoracic Epidural Block  #4  Region: Posterior Thoracolumbar Level: T12-L1 Laterality: Right       Type: Local Anesthesia Indication(s): Analgesia         Route: Infiltration ([place]/IM) IV Access: Declined Sedation: Declined  Local Anesthetic: Lidocaine 1-2%  Position: Prone   Indications: 1. Spinal stenosis of lumbar region with neurogenic claudication   2. DDD (degenerative disc disease), lumbosacral   3. T12 compression fracture, sequela   4. Lumbar foraminal stenosis   5. Lumbar lateral recess stenosis    Pain Score: Pre-procedure: 8 /10 Post-procedure: 0-No pain/10   Pre-op H&P Assessment:  Brittney Simpson is a 87 y.o. (year old), female patient, seen today for interventional treatment. She  has a past surgical history that includes Hernia repair; Hernia repair; Hemorrhoid surgery; Kyphoplasty (N/A, 01/23/2017); and Kyphoplasty (N/A, 01/25/2020). Brittney Simpson has a current medication list which includes the  following prescription(s): acetaminophen, amlodipine, clonazepam, cranberry, vitamin d2, furosemide, latanoprost, levothyroxine, lisinopril, melatonin, metoprolol succinate, omeprazole, tamsulosin, tramadol, trazodone, and sulfamethoxazole-trimethoprim. Her primarily concern today is the Back Pain (lower)  Initial Vital Signs:  Pulse/HCG Rate: (!) 58  Temp: (!) 96.8 F (36 C) Resp: 14 BP: (!) 172/78 SpO2: 99 %  BMI: Estimated body mass index is 20.19 kg/m as calculated from the following:   Height as of this encounter: 5\' 3"  (1.6 m).   Weight as of this encounter: 114 lb (51.7 kg).  Risk Assessment: Allergies: Reviewed. She has No Known Allergies.  Allergy Precautions: None required Coagulopathies: Reviewed. None identified.  Blood-thinner therapy: None at this time Active Infection(s): Reviewed. None identified. Brittney Simpson is afebrile  Site Confirmation: Brittney Simpson was asked to confirm the procedure and laterality before marking the site Procedure checklist: Completed Consent: Before the procedure and under the influence of no sedative(s), amnesic(s), or anxiolytics, the patient was informed of the treatment options, risks and possible complications. To fulfill our ethical and legal obligations, as recommended by the American Medical Association's Code of Ethics, I have informed the patient of my clinical impression; the nature and purpose of the treatment or procedure; the risks, benefits, and possible complications of the intervention; the alternatives, including doing nothing; the risk(s) and benefit(s) of the alternative treatment(s) or procedure(s); and the risk(s) and benefit(s) of doing nothing. The patient was provided information about the general risks and possible complications associated with the procedure. These may include, but are not limited to: failure to achieve desired goals, infection, bleeding, organ or nerve damage, allergic reactions, paralysis, and death. In  addition, the patient was informed of those risks and complications associated to Spine-related procedures, such as failure to decrease pain; infection (  i.e.: Meningitis, epidural or intraspinal abscess); bleeding (i.e.: epidural hematoma, subarachnoid hemorrhage, or any other type of intraspinal or peri-dural bleeding); organ or nerve damage (i.e.: Any type of peripheral nerve, nerve root, or spinal cord injury) with subsequent damage to sensory, motor, and/or autonomic systems, resulting in permanent pain, numbness, and/or weakness of one or several areas of the body; allergic reactions; (i.e.: anaphylactic reaction); and/or death. Furthermore, the patient was informed of those risks and complications associated with the medications. These include, but are not limited to: allergic reactions (i.e.: anaphylactic or anaphylactoid reaction(s)); adrenal axis suppression; blood sugar elevation that in diabetics may result in ketoacidosis or comma; water retention that in patients with history of congestive heart failure may result in shortness of breath, pulmonary edema, and decompensation with resultant heart failure; weight gain; swelling or edema; medication-induced neural toxicity; particulate matter embolism and blood vessel occlusion with resultant organ, and/or nervous system infarction; and/or aseptic necrosis of one or more joints. Finally, the patient was informed that Medicine is not an exact science; therefore, there is also the possibility of unforeseen or unpredictable risks and/or possible complications that may result in a catastrophic outcome. The patient indicated having understood very clearly. We have given the patient no guarantees and we have made no promises. Enough time was given to the patient to ask questions, all of which were answered to the patient's satisfaction. Brittney Simpson has indicated that she wanted to continue with the procedure. Attestation: I, the ordering provider, attest that  I have discussed with the patient the benefits, risks, side-effects, alternatives, likelihood of achieving goals, and potential problems during recovery for the procedure that I have provided informed consent. Date  Time: 02/06/2021 10:08 AM  Pre-Procedure Preparation:  Monitoring: As per clinic protocol. Respiration, ETCO2, SpO2, BP, heart rate and rhythm monitor placed and checked for adequate function Safety Precautions: Patient was assessed for positional comfort and pressure points before starting the procedure. Time-out: I initiated and conducted the "Time-out" before starting the procedure, as per protocol. The patient was asked to participate by confirming the accuracy of the "Time Out" information. Verification of the correct person, site, and procedure were performed and confirmed by me, the nursing staff, and the patient. "Time-out" conducted as per Joint Commission's Universal Protocol (UP.01.01.01). Time: 1120  Description of Procedure:          Target Area: For Epidural Steroid injection(s), the target area is the  interlaminar space, initially targeting the lower border of the superior vertebral body lamina. Approach: Interlaminar approach. Area Prepped: Entire Posterior Thoracolumbar Region DuraPrep (Iodine Povacrylex [0.7% available iodine] and Isopropyl Alcohol, 74% w/w) Safety Precautions: Aspiration looking for blood return was conducted prior to all injections. At no point did we inject any substances, as a needle was being advanced. No attempts were made at seeking any paresthesias. Safe injection practices and needle disposal techniques used. Medications properly checked for expiration dates. SDV (single dose vial) medications used. Description of the Procedure: Protocol guidelines were followed. The patient was placed in position over the fluoroscopy table. The target area was identified and the area prepped in the usual manner. Skin & deeper tissues infiltrated with local  anesthetic. Appropriate amount of time allowed to pass for local anesthetics to take effect. The procedure needles were then advanced to the target area. The inferior aspect of the superior lamina was contacted and the needle walked caudad, until the lamina was cleared. The epidural space was identified using "loss-of-resistance technique" with 0.9% PF-NSS (2-19mL), in  a low friction 10cc LOR glass syringe. Proper needle placement was secured. Negative aspiration confirmed. Solution injected in intermittent fashion, asking for systemic symptoms every 0.5 cc of injectate. The needles were then removed and the area cleansed, making sure to leave some of the prepping solution behind to take advantage of its long term bactericidal properties. Vitals:   02/06/21 1007 02/06/21 1115 02/06/21 1125 02/06/21 1133  BP: (!) 172/78 (!) 176/93 (!) 188/84 (!) 168/82  Pulse: (!) 58 (!) 58 (!) 57 (!) 54  Resp: 14 12 14 15   Temp: (!) 96.8 F (36 C)     TempSrc: Temporal     SpO2: 99% 93% 96% 97%  Weight: 114 lb (51.7 kg)     Height: 5\' 3"  (1.6 m)       Start Time: 1120 hrs. End Time: 1129 hrs. Materials:  Needle(s) Type: Epidural needle Gauge: 17G Length: 3.5-in Medication(s): Please see orders for medications and dosing details.  Imaging Guidance (Spinal):          Type of Imaging Technique: Fluoroscopy Guidance (Spinal) Indication(s): Assistance in needle guidance and placement for procedures requiring needle placement in or near specific anatomical locations not easily accessible without such assistance. Exposure Time: Please see nurses notes. Contrast: Before injecting any contrast, we confirmed that the patient did not have an allergy to iodine, shellfish, or radiological contrast. Once satisfactory needle placement was completed at the desired level, radiological contrast was injected. Contrast injected under live fluoroscopy. No contrast complications. See chart for type and volume of contrast  used. Fluoroscopic Guidance: I was personally present during the use of fluoroscopy. "Tunnel Vision Technique" used to obtain the best possible view of the target area. Parallax error corrected before commencing the procedure. "Direction-depth-direction" technique used to introduce the needle under continuous pulsed fluoroscopy. Once target was reached, antero-posterior, oblique, and lateral fluoroscopic projection used confirm needle placement in all planes. Images permanently stored in EMR. Interpretation: I personally interpreted the imaging intraoperatively. Adequate needle placement confirmed in multiple planes. Appropriate spread of contrast into desired area was observed. No evidence of afferent or efferent intravascular uptake. No intrathecal or subarachnoid spread observed. Permanent images saved into the patient's record.  Antibiotic Prophylaxis:   Anti-infectives (From admission, onward)    None      Indication(s): None identified  Post-operative Assessment:  Post-procedure Vital Signs:  Pulse/HCG Rate: (!) 54 (sb)  Temp: (!) 96.8 F (36 C) Resp: 15 BP: (!) 168/82 SpO2: 97 %  EBL: None  Complications: No immediate post-treatment complications observed by team, or reported by patient.  Note: The patient tolerated the entire procedure well. A repeat set of vitals were taken after the procedure and the patient was kept under observation following institutional policy, for this type of procedure. Post-procedural neurological assessment was performed, showing return to baseline, prior to discharge. The patient was provided with post-procedure discharge instructions, including a section on how to identify potential problems. Should any problems arise concerning this procedure, the patient was given instructions to immediately contact us, at any time, without hesitation. In any case, we plan to contact the patient by telephone for a follow-up status report regarding this interventional  procedure.  Comments:  No additional relevant information.  Plan of Care  Orders:  Orders Placed This Encounter  Procedures   Lumbar Epidural Injection    Scheduling Instructions:     Procedure: Interlaminar LESI T12-L1     Laterality: Right-sided     Sedation: Patient's choice  Timeframe:  Today    Order Specific Question:   Where will this procedure be performed?    Answer:   [facility]   DG PAIN [facility] C-ARM 1-60 MIN NO REPORT    Intraoperative interpretation by procedural physician at [place].    Standing Status:   Standing    Number of Occurrences:   1    Order Specific Question:   Reason for exam:    Answer:   Assistance in needle guidance and placement for procedures requiring needle placement in or near specific anatomical locations not easily accessible without such assistance.   DG Lumbar Spine Complete    Please evaluate fracture which seems to have progressed.    Standing Status:   Future    Standing Expiration Date:   03/09/2021    Order Specific Question:   Reason for Exam (SYMPTOM  OR DIAGNOSIS REQUIRED)    Answer:   T12 vertebral fracture    Order Specific Question:   Preferred imaging location?    Answer:   [facility]    Order Specific Question:   Call Results- Best Contact Number?    Answer:   [phone]   Ambulatory referral to Neurosurgery    Referral Priority:   Routine    Referral Type:   Surgical    Referral Reason:   Specialty Services Required    Requested Specialty:   Neurosurgery    Number of Visits Requested:   1   Informed Consent Details: Physician/Practitioner Attestation; Transcribe to consent form and obtain patient signature    Note: Always confirm laterality of pain with Ms. Bentley, before procedure. Transcribe to consent form and obtain patient signature.    Order Specific Question:   Physician/Practitioner attestation of informed consent for procedure/surgical case    Answer:   I, the  physician/practitioner, attest that I have discussed with the patient the benefits, risks, side effects, alternatives, likelihood of achieving goals and potential problems during recovery for the procedure that I have provided informed consent.    Order Specific Question:   Procedure    Answer:   Lumbar epidural steroid injection under fluoroscopic guidance    Order Specific Question:   Physician/Practitioner performing the procedure    Answer:   Dante Roudebush A. Dossie Arbour, MD    Order Specific Question:   Indication/Reason    Answer:   Low back and/or lower extremity pain secondary to lumbar radiculitis   Provide equipment / supplies at bedside    "Epidural Tray" (Disposable  single use) Catheter: NOT required    Standing Status:   Standing    Number of Occurrences:   1    Order Specific Question:   Specify    Answer:   Epidural Tray    Chronic Opioid Analgesic:  Tramadol 50 mg, 1 tab PO q 8 hrs (150 mg/day of tramadol) MME/day: 15 mg/day.   Medications ordered for procedure: Meds ordered this encounter  Medications   iohexol (OMNIPAQUE) 180 MG/ML injection 10 mL    Must be Myelogram-compatible. If not available, you may substitute with a water-soluble, non-ionic, hypoallergenic, myelogram-compatible radiological contrast medium.   lidocaine (XYLOCAINE) 2 % (with pres) injection 400 mg   sodium chloride flush (NS) 0.9 % injection 2 mL   ropivacaine (PF) 2 mg/mL (0.2%) (NAROPIN) injection 2 mL   triamcinolone acetonide (KENALOG-40) injection 40 mg    Medications administered: We administered iohexol, lidocaine, sodium chloride flush, ropivacaine (PF) 2 mg/mL (0.2%), and triamcinolone acetonide.  See the  medical record for exact dosing, route, and time of administration.  Follow-up plan:   Return in about 2 weeks (around 02/20/2021) for procedure day (afternoon VV) (PPE).      Interventional Therapies  Risk  Complexity Considerations:   Estimated body mass index is 20.19 kg/m as  calculated from the following:   Height as of this encounter: 5\' 3"  (1.6 m).   Weight as of this encounter: 114 lb (51.7 kg). Note: Hard of hearing   Planned  Pending:   Pending further evaluation   Under consideration:   Possible bilateral lumbar facet RFA. (Offerred several times. Pt. chooses to hold.) Diagnostic right hip joint injection  Diagnostic right L3 TFESI  Diagnostic right trochanteric bursa injection  Diagnostic right SI joint block  Diagnostic midline caudal ESI (for the tailbone pain)   Completed:   Palliative right T12-L1 LESI x3 (11/28/2020)  Therapeutic right L2-3 LESI x1 (03/07/2020)  Diagnostic/therapeutic bilateral SI joint injection x1 (02/10/2020)  Palliative right lumbar facet block x13 (01/04/2020)  Palliative left lumbar facet block x11 (01/04/2020)    Therapeutic  Palliative (PRN) options:   Palliative left T12-L1 LESI #4  Palliative right lumbar facet block #14  Palliative left lumbar facet block #12     Recent Visits Date Type Provider Dept  01/24/21 Office Visit Milinda Pointer, MD [facility]  12/13/20 Telemedicine Milinda Pointer, MD [facility]  11/28/20 Procedure visit Milinda Pointer, MD [facility]  Showing recent visits within past 90 days and meeting all other requirements Today's Visits Date Type Provider Dept  02/06/21 Procedure visit Milinda Pointer, MD [facility]  Showing today's visits and meeting all other requirements Future Appointments Date Type Provider Dept  02/20/21 Appointment Milinda Pointer, MD [facility]  04/18/21 Appointment Milinda Pointer, MD [facility]  Showing future appointments within next 90 days and meeting all other requirements Disposition: Discharge home  Discharge (Date  Time): 02/06/2021; 1132 hrs.   Primary Care Physician: Olin Hauser, DO Location: [facility] Note by: Gaspar Cola, MD Date: 02/06/2021; Time: 1:45 PM  Disclaimer:  Medicine is not an Chief Strategy Officer. The only guarantee in medicine is that nothing is guaranteed. It is important to note that the decision to proceed with this intervention was based on the information collected from the patient. The Data and conclusions were drawn from the patient's questionnaire, the interview, and the physical examination. Because the information was provided in large part by the patient, it cannot be guaranteed that it has not been purposely or unconsciously manipulated. Every effort has been made to obtain as much relevant data as possible for this evaluation. It is important to note that the conclusions that lead to this procedure are derived in large part from the available data. Always take into account that the treatment will also be dependent on availability of resources and existing treatment guidelines, considered by other Pain Management Practitioners as being common knowledge and practice, at the time of the intervention. For Medico-Legal purposes, it is also important to point out that variation in procedural techniques and pharmacological choices are the acceptable norm. The indications, contraindications, technique, and results of the above procedure should only be interpreted and judged by a Board-Certified Interventional Pain Specialist with extensive familiarity and expertise in the same exact procedure and technique.

## 2021-02-06 ENCOUNTER — Ambulatory Visit (HOSPITAL_BASED_OUTPATIENT_CLINIC_OR_DEPARTMENT_OTHER): Payer: Medicare Other | Admitting: Pain Medicine

## 2021-02-06 ENCOUNTER — Ambulatory Visit
Admission: RE | Admit: 2021-02-06 | Discharge: 2021-02-06 | Disposition: A | Discharge status: Home / Self Care | Payer: Medicare Other | Source: Ambulatory Visit | Attending: Pain Medicine | Admitting: Pain Medicine

## 2021-02-06 ENCOUNTER — Other Ambulatory Visit: Payer: Self-pay

## 2021-02-06 VITALS — BP 168/82 | HR 54 | Temp 96.8°F | Resp 15 | Ht 63.0 in | Wt 114.0 lb

## 2021-02-06 DIAGNOSIS — S22080S Wedge compression fracture of T11-T12 vertebra, sequela: Secondary | ICD-10-CM | POA: Insufficient documentation

## 2021-02-06 DIAGNOSIS — M5137 Other intervertebral disc degeneration, lumbosacral region: Secondary | ICD-10-CM | POA: Insufficient documentation

## 2021-02-06 DIAGNOSIS — M48062 Spinal stenosis, lumbar region with neurogenic claudication: Secondary | ICD-10-CM

## 2021-02-06 DIAGNOSIS — M48061 Spinal stenosis, lumbar region without neurogenic claudication: Secondary | ICD-10-CM | POA: Insufficient documentation

## 2021-02-06 MED ORDER — IOHEXOL 180 MG/ML  SOLN
10.0000 mL | Freq: Once | INTRAMUSCULAR | Status: AC
Start: 2021-02-06 — End: 2021-02-06
  Administered 2021-02-06: 10 mL via EPIDURAL
  Filled 2021-02-06: qty 20

## 2021-02-06 MED ORDER — ROPIVACAINE HCL 2 MG/ML IJ SOLN
INTRAMUSCULAR | Status: AC
  Filled 2021-02-06: qty 20

## 2021-02-06 MED ORDER — SODIUM CHLORIDE (PF) 0.9 % IJ SOLN
INTRAMUSCULAR | Status: AC
  Filled 2021-02-06: qty 10

## 2021-02-06 MED ORDER — ROPIVACAINE HCL 2 MG/ML IJ SOLN
2.0000 mL | Freq: Once | INTRAMUSCULAR | Status: AC
Start: 2021-02-06 — End: 2021-02-06
  Administered 2021-02-06: 2 mL via EPIDURAL

## 2021-02-06 MED ORDER — SODIUM CHLORIDE 0.9% FLUSH
2.0000 mL | Freq: Once | INTRAVENOUS | Status: AC
Start: 2021-02-06 — End: 2021-02-06
  Administered 2021-02-06: 2 mL

## 2021-02-06 MED ORDER — LIDOCAINE HCL (PF) 2 % IJ SOLN
INTRAMUSCULAR | Status: AC
  Filled 2021-02-06: qty 5

## 2021-02-06 MED ORDER — LIDOCAINE HCL 2 % IJ SOLN
20.0000 mL | Freq: Once | INTRAMUSCULAR | Status: AC
  Administered 2021-02-06: 100 mg

## 2021-02-06 MED ORDER — TRIAMCINOLONE ACETONIDE 40 MG/ML IJ SUSP
40.0000 mg | Freq: Once | INTRAMUSCULAR | Status: AC
  Administered 2021-02-06: 40 mg
  Filled 2021-02-06: qty 1

## 2021-02-06 NOTE — Patient Instructions (Signed)

## 2021-02-06 NOTE — Progress Notes (Signed)
Safety precautions to be maintained throughout the outpatient stay will include: orient to surroundings, keep bed in low position, maintain call bell within reach at all times, provide assistance with transfer out of bed and ambulation.  

## 2021-02-07 ENCOUNTER — Telehealth: Payer: Self-pay

## 2021-02-07 NOTE — Telephone Encounter (Signed)
Called PP. Daughter answered and states patient is doing well. Instructed to call if needed.

## 2021-02-16 DIAGNOSIS — H401133 Primary open-angle glaucoma, bilateral, severe stage: Secondary | ICD-10-CM | POA: Diagnosis not present

## 2021-02-19 NOTE — Progress Notes (Signed)
Patient: Brittney Simpson  Service Category: E/M  Provider: Gaspar Cola, MD  DOB: 1928/04/28  DOS: 02/20/2021  Location: Office  MRN: [id number]  Setting: Ambulatory outpatient  Referring Provider: Nobie Putnam *  Type: Established Patient  Specialty: Interventional Pain Management  PCP: Olin Hauser, DO  Location: Remote location  Delivery: TeleHealth     Virtual Encounter - Pain Management PROVIDER NOTE: Information contained herein reflects review and annotations entered in association with encounter. Interpretation of such information and data should be left to medically-trained personnel. Information provided to patient can be located elsewhere in the medical record under "Patient Instructions". Document created using STT-dictation technology, any transcriptional errors that may result from process are unintentional.    Contact & Pharmacy Preferred: [phone] "[facility]" Home: [phone] (home) Mobile: [phone] (mobile) E-mail: No e-mail address on record  [place], [address] [place] [address] Phone: [phone] Fax: [phone]   Pre-screening  Brittney Simpson offered "in-person" vs "virtual" encounter. She indicated preferring virtual for this encounter.   Reason COVID-19*  Social distancing based on CDC and AMA recommendations.   I contacted TYNLEE BAYLE on 02/20/2021 via telephone.      I clearly identified myself as Gaspar Cola, MD. I verified that I was speaking with the correct person using two identifiers (Name: KEISHLA OYER, and date of birth: August 09, 1927).  Consent I sought verbal advanced consent from [name] for virtual visit interactions. I informed Ms. Risenhoover of possible security and privacy concerns, risks, and limitations associated with providing "not-in-person" medical evaluation and management services. I also informed Ms. Markwood of the availability of  "in-person" appointments. Finally, I informed her that there would be a charge for the virtual visit and that she could be  personally, fully or partially, financially responsible for it. Ms. Nissan expressed understanding and agreed to proceed.   Historic Elements   [name] is a 87 y.o. year old, female patient evaluated today after our last contact on 02/06/2021. Brittney Simpson  has a past medical history of Anxiety, Back ache, Hiatal hernia, Hyperlipidemia, Hypertension, Hypothyroid, Osteopenia, Thyroid disease, and UTI (urinary tract infection). She also  has a past surgical history that includes Hernia repair; Hernia repair; Hemorrhoid surgery; Kyphoplasty (N/A, 01/23/2017); and Kyphoplasty (N/A, 01/25/2020). Brittney Simpson has a current medication list which includes the following prescription(s): acetaminophen, amlodipine, clonazepam, cranberry, vitamin d2, furosemide, latanoprost, levothyroxine, lisinopril, melatonin, metoprolol succinate, omeprazole, sulfamethoxazole-trimethoprim, tamsulosin, tramadol, and trazodone. She  reports that she has never smoked. She has never used smokeless tobacco. She reports that she does not drink alcohol and does not use drugs. Brittney Simpson has No Known Allergies.   HPI  Today, she is being contacted for a post-procedure assessment.  Today I spoke to [name], one of her daughters, who indicated that they are pending to see the neurosurgeon and to get the x-rays that I had ordered.  The patient is currently doing extremely well with possibly over 80% ongoing improvement of her pain.  Post-Procedure Evaluation  Procedure (02/06/2021):  Type: Therapeutic Inter-Laminar Thoracic Epidural Block  #4  Region: Posterior Thoracolumbar Level: T12-L1 Laterality: Right   Pre-procedure pain level: 8/10 Post-procedure: 0/10 (100% relief)  Anxiolysis: none.  Effectiveness during initial hour after procedure (Ultra-Short Term Relief): 100 %.  Local anesthetic  used: Long-acting (4-6 hours) Effectiveness: Defined as any analgesic benefit obtained secondary to the administration of local anesthetics. This carries significant diagnostic  value as to the etiological location, or anatomical origin, of the pain. Duration of benefit is expected to coincide with the duration of the local anesthetic used.  Effectiveness during initial 4-6 hours after procedure (Short-Term Relief): 100 %.  Long-term benefit: Defined as any relief past the pharmacologic duration of the local anesthetics.  Effectiveness past the initial 6 hours after procedure (Long-Term Relief): 80 % (daughter reports she is doing well).  Benefits, current: Defined as benefit present at the time of this evaluation.   Analgesia:   The patient indicates having attained an ongoing 80% relief of the pain. Function: Brittney Simpson reports improvement in function ROM: Brittney Simpson reports improvement in ROM  Pharmacotherapy Assessment   Analgesic: Tramadol 50 mg, 1 tab PO q 8 hrs (150 mg/day of tramadol) MME/day: 15 mg/day.   Monitoring: [place] PMP: PDMP reviewed during this encounter.       Pharmacotherapy: No side-effects or adverse reactions reported. Compliance: No problems identified. Effectiveness: Clinically acceptable. Plan: Refer to "POC". UDS:  Summary  Date Value Ref Range Status  01/24/2021 Note  Final    Comment:    ==================================================================== [employer] Select 13 (MW) ==================================================================== Specimen Alert Note: Urinary creatinine is low; ability to detect some drugs may be compromised. Interpret results with caution. (Creatinine) ==================================================================== Test                             Result       Flag       Units  Drug Present and Declared for Prescription Verification   7-aminoclonazepam              287          EXPECTED   ng/mg creat     7-aminoclonazepam is an expected metabolite of clonazepam. Source of    clonazepam is a scheduled prescription medication.    Tramadol                       >33333       EXPECTED   ng/mg creat   O-Desmethyltramadol            21013        EXPECTED   ng/mg creat   N-Desmethyltramadol            7287         EXPECTED   ng/mg creat    Source of tramadol is a prescription medication. O-desmethyltramadol    and N-desmethyltramadol are expected metabolites of tramadol.  ==================================================================== Test                      Result    Flag   Units      Ref Range   Creatinine              15        LL     mg/dL      >=20 ==================================================================== Declared Medications:  The flagging and interpretation on this report are based on the  following declared medications.  Unexpected results may arise from  inaccuracies in the declared medications.   **Note: The testing scope of this panel includes these medications:   Clonazepam (Klonopin)  Tramadol (Ultram)   **Note: The testing scope of this panel does not include the  following reported medications:   Acetaminophen (Tylenol)  Amlodipine  Cranberry  Furosemide  Latanoprost (Xalatan)  Levothyroxine  Lisinopril  Melatonin  Metoprolol  Omeprazole  Sulfamethoxazole (Bactrim)  Tamsulosin (Flomax)  Trazodone  Trimethoprim (Bactrim)  Vitamin D2 ==================================================================== For clinical consultation, please call [phone]. ====================================================================      Laboratory Chemistry Profile   Renal Lab Results  Component Value Date   BUN 8 05/16/2020   CREATININE 0.79 77/93/9030   BCR NOT APPLICABLE 04/20/3006   GFRAA 76 05/16/2020   GFRNONAA 66 05/16/2020    Hepatic Lab Results  Component Value Date   AST 13 05/16/2020   ALT 9 05/16/2020   ALBUMIN 3.6 11/22/2016    ALKPHOS 59 11/22/2016   LIPASE 21 06/14/2016    Electrolytes Lab Results  Component Value Date   NA 136 05/16/2020   K 4.3 05/16/2020   CL 98 05/16/2020   CALCIUM 9.7 05/16/2020   MG 2.1 01/14/2014    Bone No results found for: VD25OH, VD125OH2TOT, MA2633HL4, TG2563SL3, 25OHVITD1, 25OHVITD2, 25OHVITD3, TESTOFREE, TESTOSTERONE  Inflammation (CRP: Acute Phase) (ESR: Chronic Phase) No results found for: CRP, ESRSEDRATE, LATICACIDVEN       Note: Above Lab results reviewed.  Imaging  DG PAIN CLINIC C-ARM 1-60 MIN NO REPORT Fluoro was used, but no Radiologist interpretation will be provided.  Please refer to "NOTES" tab for provider progress note.  Assessment  The primary encounter diagnosis was Chronic low back pain (1ry area of Pain) (Bilateral) (R>L) w/o sciatica. Diagnoses of Chronic hip pain (2ry area of Pain) (Right), Chronic lower extremity pain (3ry area of Pain) (Right), Spinal stenosis of lumbar region with neurogenic claudication, T12 compression fracture, sequela, DDD (degenerative disc disease), lumbosacral, and Lumbar foraminal stenosis were also pertinent to this visit.  Plan of Care  Problem-specific:  No problem-specific Assessment & Plan notes found for this encounter.  Ms. MORENE CECILIO has a current medication list which includes the following long-term medication(s): amlodipine, clonazepam, furosemide, levothyroxine, lisinopril, metoprolol succinate, omeprazole, tramadol, and trazodone.  Pharmacotherapy (Medications Ordered): No orders of the defined types were placed in this encounter.  Orders:  No orders of the defined types were placed in this encounter.  Follow-up plan:   Return if symptoms worsen or fail to improve, for scheduled encounter.     Interventional Therapies  Risk  Complexity Considerations:   Estimated body mass index is 20.19 kg/m as calculated from the following:   Height as of this encounter: '5\' 3"'  (1.6 m).   Weight as of this  encounter: 114 lb (51.7 kg). Note: Hard of hearing   Planned  Pending:   Pending further evaluation   Under consideration:   Possible bilateral lumbar facet RFA. (Offerred several times. Pt. chooses to hold.) Diagnostic right hip joint injection  Diagnostic right L3 TFESI  Diagnostic right trochanteric bursa injection  Diagnostic right SI joint block  Diagnostic midline caudal ESI (for the tailbone pain)   Completed:   Palliative right T12-L1 LESI x3 (11/28/2020)  Therapeutic right L2-3 LESI x1 (03/07/2020)  Diagnostic/therapeutic bilateral SI joint injection x1 (02/10/2020)  Palliative right lumbar facet block x13 (01/04/2020)  Palliative left lumbar facet block x11 (01/04/2020)    Therapeutic  Palliative (PRN) options:   Palliative left T12-L1 LESI #4  Palliative right lumbar facet block #14  Palliative left lumbar facet block #12      Recent Visits Date Type Provider Dept  02/06/21 Procedure visit Milinda Pointer, MD [facility]  01/24/21 Office Visit Milinda Pointer, MD [facility]  12/13/20 Telemedicine Milinda Pointer, MD [facility]  11/28/20 Procedure visit Milinda Pointer, MD  [facility]  Showing recent visits within past 90 days and meeting all other requirements Today's Visits Date Type Provider Dept  02/20/21 Telemedicine Milinda Pointer, MD [facility]  Showing today's visits and meeting all other requirements Future Appointments Date Type Provider Dept  04/18/21 Appointment Milinda Pointer, MD [facility]  Showing future appointments within next 90 days and meeting all other requirements I discussed the assessment and treatment plan with the patient. The patient was provided an opportunity to ask questions and all were answered. The patient agreed with the plan and demonstrated an understanding of the instructions.  Patient advised to call back or seek an in-person evaluation if the  symptoms or condition worsens.  Duration of encounter: 15 minutes.  Note by: Gaspar Cola, MD Date: 02/20/2021; Time: 5:20 PM

## 2021-02-20 ENCOUNTER — Ambulatory Visit: Payer: Medicare Other | Attending: Pain Medicine | Admitting: Pain Medicine

## 2021-02-20 ENCOUNTER — Other Ambulatory Visit: Payer: Self-pay

## 2021-02-20 DIAGNOSIS — M545 Low back pain, unspecified: Secondary | ICD-10-CM

## 2021-02-20 DIAGNOSIS — M48062 Spinal stenosis, lumbar region with neurogenic claudication: Secondary | ICD-10-CM | POA: Diagnosis not present

## 2021-02-20 DIAGNOSIS — M25551 Pain in right hip: Secondary | ICD-10-CM

## 2021-02-20 DIAGNOSIS — M48061 Spinal stenosis, lumbar region without neurogenic claudication: Secondary | ICD-10-CM

## 2021-02-20 DIAGNOSIS — G8929 Other chronic pain: Secondary | ICD-10-CM

## 2021-02-20 DIAGNOSIS — M79604 Pain in right leg: Secondary | ICD-10-CM | POA: Diagnosis not present

## 2021-02-20 DIAGNOSIS — S22080S Wedge compression fracture of T11-T12 vertebra, sequela: Secondary | ICD-10-CM | POA: Diagnosis not present

## 2021-02-20 DIAGNOSIS — M5137 Other intervertebral disc degeneration, lumbosacral region: Secondary | ICD-10-CM | POA: Diagnosis not present

## 2021-02-22 ENCOUNTER — Telehealth: Payer: Self-pay | Admitting: Family Medicine

## 2021-02-22 NOTE — Telephone Encounter (Signed)
Spoke with [name] (daughter of patient) she asked to be contacted to schedule an appointment with [name] for her mother.

## 2021-02-23 ENCOUNTER — Other Ambulatory Visit: Payer: Self-pay | Admitting: Family Medicine

## 2021-02-23 DIAGNOSIS — K219 Gastro-esophageal reflux disease without esophagitis: Secondary | ICD-10-CM

## 2021-02-23 DIAGNOSIS — F419 Anxiety disorder, unspecified: Secondary | ICD-10-CM

## 2021-02-23 MED ORDER — OMEPRAZOLE 20 MG PO CPDR: 20.0000 mg | DELAYED_RELEASE_CAPSULE | Freq: Every day | ORAL | 0 refills | Status: DC

## 2021-02-23 MED ORDER — CLONAZEPAM 0.5 MG PO TABS: 0.2500 mg | ORAL_TABLET | Freq: Two times a day (BID) | ORAL | 2 refills | Status: DC

## 2021-02-23 NOTE — Telephone Encounter (Signed)
Medication: clonazePAM (KLONOPIN) 0.5 MG tablet omeprazole (PRILOSEC) 20 MG capsule Has the pt contacted their pharmacy? no  Preferred pharmacy: [place], [place].  Please be advised refills may take up to 3 business days.  We ask that you follow up with your pharmacy.

## 2021-02-23 NOTE — Telephone Encounter (Signed)
Left a message on [name]'s machine letting her know that she can call back to make an appt for next week and she doesn't need to speak with me to get that done.
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
## 2021-03-06 DIAGNOSIS — M40204 Unspecified kyphosis, thoracic region: Secondary | ICD-10-CM | POA: Diagnosis not present

## 2021-03-06 DIAGNOSIS — Z8781 Personal history of (healed) traumatic fracture: Secondary | ICD-10-CM | POA: Diagnosis not present

## 2021-03-06 DIAGNOSIS — M5489 Other dorsalgia: Secondary | ICD-10-CM | POA: Diagnosis not present

## 2021-03-06 DIAGNOSIS — S22080A Wedge compression fracture of T11-T12 vertebra, initial encounter for closed fracture: Secondary | ICD-10-CM | POA: Diagnosis not present

## 2021-03-07 ENCOUNTER — Encounter: Payer: Self-pay | Admitting: Family Medicine

## 2021-03-07 ENCOUNTER — Other Ambulatory Visit: Payer: Self-pay

## 2021-03-07 ENCOUNTER — Ambulatory Visit (INDEPENDENT_AMBULATORY_CARE_PROVIDER_SITE_OTHER): Payer: Medicare Other | Admitting: Family Medicine

## 2021-03-07 VITALS — BP 158/83 | HR 91 | Ht 63.0 in | Wt 120.6 lb

## 2021-03-07 DIAGNOSIS — K219 Gastro-esophageal reflux disease without esophagitis: Secondary | ICD-10-CM

## 2021-03-07 DIAGNOSIS — R1013 Epigastric pain: Secondary | ICD-10-CM | POA: Diagnosis not present

## 2021-03-07 DIAGNOSIS — R1011 Right upper quadrant pain: Secondary | ICD-10-CM | POA: Diagnosis not present

## 2021-03-07 DIAGNOSIS — K8020 Calculus of gallbladder without cholecystitis without obstruction: Secondary | ICD-10-CM | POA: Diagnosis not present

## 2021-03-07 MED ORDER — SUCRALFATE 1 G PO TABS: 1.0000 g | ORAL_TABLET | Freq: Three times a day (TID) | ORAL | 1 refills | Status: DC

## 2021-03-07 NOTE — Patient Instructions (Addendum)
Thank you for coming to the office today.  I am not sure the exact cause of your abdominal pain, however I am concerned that one significant possibility could be - Gallstones, this can cause episodic gallbladder pain that you may be describing - Potentially uncontrolled Acid Reflux (GERD) and may have developed an Ulcer (Peptic Ulcer of stomach) - this seems less likely to me     First we will check basic blood work for abdominal causes today, will get results to you early next week.   We will schedule a RUQ Abdominal Ultrasound for evaluating gallbladder within a week approx, this is done at [facility] or Outpatient Imaging, you will be called with apt.  [place] Address: [address] [place], [place], [place] [address] Phone: [phone]  General Surgery consultation - stay tuned for apt   Take other prescribed medicine Carafate (Sucralfate) as needed up to 4 times daily (3 meals and bedtime)  to coat stomach lining to ease symptoms, if it helps reduce symptoms then it is more likely to be due to acid and/or ulcer.   INCREASE OMEPRAZOLE '20mg'$  up to TWO pills at once for '40mg'$  dose DAILY for now can order more if need  HOLD Pepto  Keep taking Gas-X  DIET RECOMMENDATIONS - Avoid spicy, greasy, fried foods, also things like caffeine, dark chocolate, peppermint can worsen - Avoid large meals and late night snacks, also do not go more than 4-5 hours without a snack or meal (not eating will worsen reflux symptoms due to stomach acid) - You may also elevate the head of your bed at night to sleep at very slight incline to help reduce symptoms   If symptoms are worsening or persistent despite treatment or develop any different severe esophagus or abdominal pain, unable to swallow solids or liquids, nausea, vomiting especially blood in vomit, fever/chills, or unintentional weight loss / no appetite, please follow-up sooner in office or seek more immediate medical  attention at hospital Emergency Department.   Regarding other medicines:   - STOP taking Ibuprofen, Advil, Motrin, Goody's / BC powder - DO NOT take without discussing with your doctor. These medicines can put you at high risk for future bleeding.   If need pain medicine, may take Tylenol Extra Strength (Acetaminophen) '500mg'$  tabs - take 1 to 2 tabs per dose (max '1000mg'$ ) every 6-8 hours for pain (take regularly, don't skip a dose for next 7 days), max 24 hour daily dose is 6 tablets or '3000mg'$ . In the future you can repeat the same everyday Tylenol course for 1-2 weeks at a time.   ----------------------------------------------------  Please schedule a Follow-up Appointment to: Return if symptoms worsen or fail to improve.  If you have any other questions or concerns, please feel free to call the office or send a message through [place]. You may also schedule an earlier appointment if necessary.  Additionally, you may be receiving a survey about your experience at our office within a few days to 1 week by e-mail or mail. We value your feedback.  Nobie Putnam, DO [place]

## 2021-03-07 NOTE — Progress Notes (Signed)
Subjective:    Patient ID: Brittney Simpson, female    DOB: Dec 03, 1927, 87 y.o.   MRN: [id number]  Brittney Simpson is a 87 y.o. female presenting on 03/07/2021 for Abdominal Pain  Here with daughter.  HPI  GERD / Epigastric Abdominal Pain  Reports onset 1 week ago from this past weekend, thought maybe it was related to dietary intake. However that did not seem to be the case.  She has tried OTC Pepto PRN some relief. She takes Gas-X PRN with some relief.  She reports that the pain is fairly consistent / persistent and not worse with eating or drinking.  Still has an appetite. Able to take some PO  Imaging reviewed 08/19/18 she had Abd CT - with evidence of Cholelithiasis with calcified gallstones.  Denies any constipation, diarrhea.    Depression screen [facility] 2/9 03/07/2021 02/06/2021 01/24/2021  Decreased Interest 0 0 0  Down, Depressed, Hopeless 0 0 0  PHQ - 2 Score 0 0 0  Altered sleeping 0 - -  Tired, decreased energy 1 - -  Change in appetite 1 - -  Feeling bad or failure about yourself  0 - -  Trouble concentrating 0 - -  Moving slowly or fidgety/restless 0 - -  Suicidal thoughts 0 - -  PHQ-9 Score 2 - -  Difficult doing work/chores Not difficult at all - -  Some recent data might be hidden    Social History   Tobacco Use   Smoking status: Never   Smokeless tobacco: Never  Substance Use Topics   Alcohol use: No    Alcohol/week: 0.0 standard drinks   Drug use: No    Review of Systems Per HPI unless specifically indicated above     Objective:    BP (!) 158/83   Pulse 91   Ht '5\' 3"'$  (1.6 m)   Wt 120 lb 9.6 oz (54.7 kg)   SpO2 100%   BMI 21.36 kg/m   Wt Readings from Last 3 Encounters:  03/07/21 120 lb 9.6 oz (54.7 kg)  02/06/21 114 lb (51.7 kg)  01/24/21 114 lb (51.7 kg)    Physical Exam Vitals and nursing note reviewed.  Constitutional:      General: She is not in acute distress.    Appearance: She is well-developed. She is not  diaphoretic.     Comments: Well-appearing, uncomfortable elderly 87 yr female, cooperative  HENT:     Head: Normocephalic and atraumatic.  Eyes:     General:        Right eye: No discharge.        Left eye: No discharge.     Conjunctiva/sclera: Conjunctivae normal.  Neck:     Thyroid: No thyromegaly.  Cardiovascular:     Rate and Rhythm: Normal rate and regular rhythm.     Heart sounds: Normal heart sounds. No murmur heard. Pulmonary:     Effort: Pulmonary effort is normal. No respiratory distress.     Breath sounds: Normal breath sounds. No wheezing or rales.  Abdominal:     General: Abdomen is flat. Bowel sounds are normal. There is no distension.     Palpations: Abdomen is soft.     Tenderness: There is abdominal tenderness in the right upper quadrant and epigastric area. There is rebound. There is no right CVA tenderness, left CVA tenderness or guarding. Negative signs include Murphy's sign.  Musculoskeletal:        General: Normal range of motion.  Cervical back: Normal range of motion and neck supple.  Lymphadenopathy:     Cervical: No cervical adenopathy.  Skin:    General: Skin is warm and dry.     Findings: No erythema or rash.  Neurological:     Mental Status: She is alert and oriented to person, place, and time.  Psychiatric:        Behavior: Behavior normal.     Comments: Well groomed, good eye contact, normal speech and thoughts     I have personally reviewed the radiology report from 08/19/18 on CT Abd Angio.  Narrative & Impression  CLINICAL DATA:  Abdominal aortic aneurysm with evidence some increase in caliber over time by CTA and duplex ultrasound.   EXAM: CT ANGIOGRAPHY ABDOMEN AND PELVIS WITH CONTRAST AND WITHOUT CONTRAST   TECHNIQUE: Multidetector CT imaging of the abdomen and pelvis was performed using the standard protocol during bolus administration of intravenous contrast. Multiplanar reconstructed images and MIPs were obtained and reviewed  to evaluate the vascular anatomy.   CONTRAST:  58m ISOVUE-370 IOPAMIDOL (ISOVUE-370) INJECTION 76%   COMPARISON:  CT of the abdomen and pelvis on 06/14/2016 and reports from aortic duplex ultrasound studies on 02/26/2017 and 03/10/2018.   FINDINGS: VASCULAR   Aorta: The visualized distal descending thoracic aorta is extremely tortuous and mildly dilated measuring up to 3.1 cm in maximum diameter. The aorta measures 3.1 cm in transverse diameter at the level of the diaphragmatic hiatus and supraceliac segment. Between the celiac and IMA origins, the aorta is extremely ectatic and demonstrates an essentially 90 degree bend from the left to the right. Maximal aneurysmal disease occurs in the juxta-renal segment at the level of the right renal artery where the aorta measures approximately 4.1 x 4.2 cm. This segment contains mural thrombus. The left renal artery comes off higher due to tilt of the aorta and the aorta measures approximately 3.2 cm in diameter just above the left renal artery origin. Just above the IMA origin, the aorta reaches a normal caliber 2.1 cm. It then increases in caliber just below the IMA origin up to a maximum diameter of 3.1 x 3.2 cm just above the bifurcation. The aorta demonstrates no evidence of dissection or hemorrhage.   Celiac: Normally patent with normal distal branch vessel anatomy and patency. No visceral artery aneurysms identified.   SMA: Normally patent.   Renals: Bilateral single renal arteries demonstrate proximal calcified plaque without significant stenosis.   IMA: Normally patent.   Inflow: Calcified plaque in both common iliac arteries without evidence of significant obstructive disease or aneurysmal disease. Maximal diameter of the right common iliac artery distally is 1.4 cm and left common iliac artery is 1.2 cm. Internal and external iliac arteries show no significant obstructive disease.   Proximal Outflow: Common femoral  arteries and femoral bifurcations are normally patent.   Veins: Delayed imaging shows no venous abnormalities.   Review of the MIP images confirms the above findings.   NON-VASCULAR   Lower chest: No acute abnormality.   Hepatobiliary: The liver is unremarkable. Calcified gallstones are present in a contracted gallbladder. No evidence of biliary ductal dilatation. Stable cyst in the left lobe of the liver.   Pancreas: Unremarkable. No pancreatic ductal dilatation or surrounding inflammatory changes.   Spleen: Normal in size without focal abnormality.   Adrenals/Urinary Tract: Adrenal glands are unremarkable. Chronic dilatation of the left renal pelvis and collecting system appears more prominent compared to the prior CT in 2017. There may be a  component relative UPJ obstruction. The aneurysmal and ectatic abdominal aorta also abuts the region of the renal pelvis and UPJ and may be contributing to relative UPJ obstruction. Arterial and venous phase imaging shows no discrepant perfusion the kidneys and if renal function is stable/normal, this is likely not of any clinical significance. Bladder is unremarkable.   Stomach/Bowel: Bowel shows no evidence of obstruction, ileus or inflammation. No focal lesions identified. No free air is seen.   Lymphatic: No enlarged lymph nodes identified.   Reproductive: Uterus and bilateral adnexa are unremarkable.   Other: No abdominal wall hernia or abnormality. No abdominopelvic ascites.   Musculoskeletal: Since the prior CT there is a significant compression deformity of the T12 vertebral body. Central portion of the compressed vertebral body does appear to contain methylmethacrylate from prior augmentation with cement also extending into the T12-L1 disc space. There is component of chronic retropulsion the posterior vertebral body. Advanced degenerative disc disease again noted at L2-3 and L5-S1 with associated anterolisthesis of L5 on  S1.   IMPRESSION: VASCULAR   Extreme tortuosity of the visualized descending thoracic aorta and abdominal aorta with several levels of aneurysmal disease. Descending thoracic aorta reaches maximal caliber of 3.1 cm. Proximal abdominal aorta measures 3.1 cm. The juxta-renal abdominal aorta reaches maximal caliber of 4.2 cm. The distal abdominal aorta just above the bifurcation reaches maximal caliber of 3.2 cm.   NON-VASCULAR   1. Cholelithiasis with calcified gallstones. 2. Increased prominence of chronic dilatation of the left renal pelvis and collecting system since 2017. This may due to a component of relative UPJ obstruction as the ectatic and dilated abdominal aorta abuts the region of the renal pelvis and left UPJ. This may not be of any clinical significance if renal function is stable and normal and does not appear to affect perfusion of the left renal parenchyma relative to the right. 3. High-grade compression deformity of the T12 vertebral body with evidence of prior vertebral augmentation. There is chronic retropulsion of the posterior T12 vertebral body into the spinal canal.     Electronically Signed   By: Aletta Edouard M.D.   On: 08/19/2018 12:05    Results for orders placed or performed in visit on 01/24/21  ToxASSURE Select 13 (MW), Urine  Result Value Ref Range   Summary Note       Assessment & Plan:   Problem List Items Addressed This Visit     GERD (gastroesophageal reflux disease)   Relevant Medications   sucralfate (CARAFATE) 1 g tablet   Other Relevant Orders   Ambulatory referral to General Surgery   Other Visit Diagnoses     RUQ abdominal pain    -  Primary   Relevant Medications   sucralfate (CARAFATE) 1 g tablet   Other Relevant Orders   US ABDOMEN LIMITED RUQ (LIVER/GB)   Ambulatory referral to General Surgery   Epigastric pain       Relevant Medications   sucralfate (CARAFATE) 1 g tablet   Other Relevant Orders   COMPLETE  METABOLIC PANEL WITH GFR   CBC with Differential/Platelet   Ambulatory referral to General Surgery   Calculus of gallbladder without cholecystitis without obstruction       Relevant Orders   US ABDOMEN LIMITED RUQ (LIVER/GB)   COMPLETE METABOLIC PANEL WITH GFR   Ambulatory referral to General Surgery       Suspected chronic cholelithiasis with recurrent RUQ colicky episodes, now more persistent Known cholelithiasis on imaging in past.  Question  GERD / PUD related  STAT US RUQ outpatient imaging center  Trial on Omeprazole dose inc to '40mg'$  daily (double '20mg'$  x 2 )  Sucralfate PRN  Referral to Gen Surgery for follow-up after RUQ [place]  Diet modifications Cholelithiasis/GERD given Return criteria reviewed when to go to hospital ED   Meds ordered this encounter  Medications   sucralfate (CARAFATE) 1 g tablet    Sig: Take 1 tablet (1 g total) by mouth 4 (four) times daily -  with meals and at bedtime.    Dispense:  40 tablet    Refill:  1      Follow up plan: Return if symptoms worsen or fail to improve.   Nobie Putnam, [facility] 03/07/2021, 11:09 AM

## 2021-03-08 ENCOUNTER — Ambulatory Visit: Payer: Medicare Other

## 2021-03-08 LAB — COMPLETE METABOLIC PANEL WITHOUT GFR
AG Ratio: 1.5 (calc) (ref 1.0–2.5)
ALT: 7 U/L (ref 6–29)
AST: 10 U/L (ref 10–35)
Albumin: 4 g/dL (ref 3.6–5.1)
Alkaline phosphatase (APISO): 79 U/L (ref 37–153)
BUN: 8 mg/dL (ref 7–25)
CO2: 31 mmol/L (ref 20–32)
Calcium: 9.6 mg/dL (ref 8.6–10.4)
Chloride: 95 mmol/L — ABNORMAL LOW (ref 98–110)
Creat: 0.75 mg/dL (ref 0.60–0.95)
Globulin: 2.7 g/dL (ref 1.9–3.7)
Glucose, Bld: 97 mg/dL (ref 65–99)
Potassium: 3.9 mmol/L (ref 3.5–5.3)
Sodium: 135 mmol/L (ref 135–146)
Total Bilirubin: 0.3 mg/dL (ref 0.2–1.2)
Total Protein: 6.7 g/dL (ref 6.1–8.1)
eGFR: 75 mL/min/{1.73_m2}

## 2021-03-08 LAB — CBC WITH DIFFERENTIAL/PLATELET
Absolute Monocytes: 583 {cells}/uL (ref 200–950)
Basophils Absolute: 11 {cells}/uL (ref 0–200)
Basophils Relative: 0.2 %
Eosinophils Absolute: 11 {cells}/uL — ABNORMAL LOW (ref 15–500)
Eosinophils Relative: 0.2 %
HCT: 37 % (ref 35.0–45.0)
Hemoglobin: 11.3 g/dL — ABNORMAL LOW (ref 11.7–15.5)
Lymphs Abs: 1712 {cells}/uL (ref 850–3900)
MCH: 24.4 pg — ABNORMAL LOW (ref 27.0–33.0)
MCHC: 30.5 g/dL — ABNORMAL LOW (ref 32.0–36.0)
MCV: 79.9 fL — ABNORMAL LOW (ref 80.0–100.0)
MPV: 9.5 fL (ref 7.5–12.5)
Monocytes Relative: 11 %
Neutro Abs: 2984 {cells}/uL (ref 1500–7800)
Neutrophils Relative %: 56.3 %
Platelets: 238 10*3/uL (ref 140–400)
RBC: 4.63 Million/uL (ref 3.80–5.10)
RDW: 14.5 % (ref 11.0–15.0)
Total Lymphocyte: 32.3 %
WBC: 5.3 10*3/uL (ref 3.8–10.8)

## 2021-03-12 ENCOUNTER — Telehealth: Payer: Self-pay | Admitting: Family Medicine

## 2021-03-12 NOTE — Telephone Encounter (Signed)
I spoke with [name] (daughter) this morning, to remind her of patients AWV telephone visit scheduled for tomorrow.  She asked if someone could call her about patients lab work from last week.  She stated that she left a message about it last Thursday and has not heard anything. I advised her that I would send a message to the nurse.

## 2021-03-13 ENCOUNTER — Other Ambulatory Visit: Payer: Medicare Other

## 2021-03-13 ENCOUNTER — Ambulatory Visit (INDEPENDENT_AMBULATORY_CARE_PROVIDER_SITE_OTHER): Payer: Medicare Other

## 2021-03-13 ENCOUNTER — Other Ambulatory Visit: Payer: Self-pay

## 2021-03-13 ENCOUNTER — Ambulatory Visit
Admission: RE | Admit: 2021-03-13 | Discharge: 2021-03-13 | Disposition: A | Discharge status: Home / Self Care | Payer: Medicare Other | Source: Ambulatory Visit | Attending: Family Medicine | Admitting: Family Medicine

## 2021-03-13 VITALS — Ht 65.0 in | Wt 120.0 lb

## 2021-03-13 DIAGNOSIS — R1011 Right upper quadrant pain: Secondary | ICD-10-CM | POA: Diagnosis not present

## 2021-03-13 DIAGNOSIS — K8020 Calculus of gallbladder without cholecystitis without obstruction: Secondary | ICD-10-CM | POA: Diagnosis not present

## 2021-03-13 DIAGNOSIS — Z0000 Encounter for general adult medical examination without abnormal findings: Secondary | ICD-10-CM

## 2021-03-13 NOTE — Patient Instructions (Signed)
Brittney Simpson , Thank you for taking time to come for your Medicare Wellness Visit. I appreciate your ongoing commitment to your health goals. Please review the following plan we discussed and let me know if I can assist you in the future.   Screening recommendations/referrals: Colonoscopy: not required Mammogram: not required Bone Density: completed 05/15/2011 Recommended yearly ophthalmology/optometry visit for glaucoma screening and checkup Recommended yearly dental visit for hygiene and checkup  Vaccinations: Influenza vaccine: due Pneumococcal vaccine: completed 07/21/2015 Tdap vaccine: completed 01/15/2020, due 01/14/2030 Shingles vaccine: discussed   Covid-19: decline  Advanced directives: Please bring a copy of your POA (Power of Attorney) and/or Living Will to your next appointment.   Conditions/risks identified: none  Next appointment: Follow up in one year for your annual wellness visit    Preventive Care 65 Years and Older, Female Preventive care refers to lifestyle choices and visits with your health care provider that can promote health and wellness. What does preventive care include? A yearly physical exam. This is also called an annual well check. Dental exams once or twice a year. Routine eye exams. Ask your health care provider how often you should have your eyes checked. Personal lifestyle choices, including: Daily care of your teeth and gums. Regular physical activity. Eating a healthy diet. Avoiding tobacco and drug use. Limiting alcohol use. Practicing safe sex. Taking low-dose aspirin every day. Taking vitamin and mineral supplements as recommended by your health care provider. What happens during an annual well check? The services and screenings done by your health care provider during your annual well check will depend on your age, overall health, lifestyle risk factors, and family history of disease. Counseling  Your health care provider may ask you  questions about your: Alcohol use. Tobacco use. Drug use. Emotional well-being. Home and relationship well-being. Sexual activity. Eating habits. History of falls. Memory and ability to understand (cognition). Work and work Statistician. Reproductive health. Screening  You may have the following tests or measurements: Height, weight, and BMI. Blood pressure. Lipid and cholesterol levels. These may be checked every 5 years, or more frequently if you are over 71 years old. Skin check. Lung cancer screening. You may have this screening every year starting at age 49 if you have a 30-pack-year history of smoking and currently smoke or have quit within the past 15 years. Fecal occult blood test (FOBT) of the stool. You may have this test every year starting at age 29. Flexible sigmoidoscopy or colonoscopy. You may have a sigmoidoscopy every 5 years or a colonoscopy every 10 years starting at age 33. Hepatitis C blood test. Hepatitis B blood test. Sexually transmitted disease (STD) testing. Diabetes screening. This is done by checking your blood sugar (glucose) after you have not eaten for a while (fasting). You may have this done every 1-3 years. Bone density scan. This is done to screen for osteoporosis. You may have this done starting at age 49. Mammogram. This may be done every 1-2 years. Talk to your health care provider about how often you should have regular mammograms. Talk with your health care provider about your test results, treatment options, and if necessary, the need for more tests. Vaccines  Your health care provider may recommend certain vaccines, such as: Influenza vaccine. This is recommended every year. Tetanus, diphtheria, and acellular pertussis (Tdap, Td) vaccine. You may need a Td booster every 10 years. Zoster vaccine. You may need this after age 7. Pneumococcal 13-valent conjugate (PCV13) vaccine. One dose is recommended after  age 60. Pneumococcal polysaccharide  (PPSV23) vaccine. One dose is recommended after age 66. Talk to your health care provider about which screenings and vaccines you need and how often you need them. This information is not intended to replace advice given to you by your health care provider. Make sure you discuss any questions you have with your health care provider. Document Released: 08/11/2015 Document Revised: 04/03/2016 Document Reviewed: 05/16/2015 Elsevier Interactive Patient Education  2017 [place] Prevention in the Home Falls can cause injuries. They can happen to people of all ages. There are many things you can do to make your home safe and to help prevent falls. What can I do on the outside of my home? Regularly fix the edges of walkways and driveways and fix any cracks. Remove anything that might make you trip as you walk through a door, such as a raised step or threshold. Trim any bushes or trees on the path to your home. Use bright outdoor lighting. Clear any walking paths of anything that might make someone trip, such as rocks or tools. Regularly check to see if handrails are loose or broken. Make sure that both sides of any steps have handrails. Any raised decks and porches should have guardrails on the edges. Have any leaves, snow, or ice cleared regularly. Use sand or salt on walking paths during winter. Clean up any spills in your garage right away. This includes oil or grease spills. What can I do in the bathroom? Use night lights. Install grab bars by the toilet and in the tub and shower. Do not use towel bars as grab bars. Use non-skid mats or decals in the tub or shower. If you need to sit down in the shower, use a plastic, non-slip stool. Keep the floor dry. Clean up any water that spills on the floor as soon as it happens. Remove soap buildup in the tub or shower regularly. Attach bath mats securely with double-sided non-slip rug tape. Do not have throw rugs and other things on the  floor that can make you trip. What can I do in the bedroom? Use night lights. Make sure that you have a light by your bed that is easy to reach. Do not use any sheets or blankets that are too big for your bed. They should not hang down onto the floor. Have a firm chair that has side arms. You can use this for support while you get dressed. Do not have throw rugs and other things on the floor that can make you trip. What can I do in the kitchen? Clean up any spills right away. Avoid walking on wet floors. Keep items that you use a lot in easy-to-reach places. If you need to reach something above you, use a strong step stool that has a grab bar. Keep electrical cords out of the way. Do not use floor polish or wax that makes floors slippery. If you must use wax, use non-skid floor wax. Do not have throw rugs and other things on the floor that can make you trip. What can I do with my stairs? Do not leave any items on the stairs. Make sure that there are handrails on both sides of the stairs and use them. Fix handrails that are broken or loose. Make sure that handrails are as long as the stairways. Check any carpeting to make sure that it is firmly attached to the stairs. Fix any carpet that is loose or worn. Avoid having throw rugs  at the top or bottom of the stairs. If you do have throw rugs, attach them to the floor with carpet tape. Make sure that you have a light switch at the top of the stairs and the bottom of the stairs. If you do not have them, ask someone to add them for you. What else can I do to help prevent falls? Wear shoes that: Do not have high heels. Have rubber bottoms. Are comfortable and fit you well. Are closed at the toe. Do not wear sandals. If you use a stepladder: Make sure that it is fully opened. Do not climb a closed stepladder. Make sure that both sides of the stepladder are locked into place. Ask someone to hold it for you, if possible. Clearly mark and make  sure that you can see: Any grab bars or handrails. First and last steps. Where the edge of each step is. Use tools that help you move around (mobility aids) if they are needed. These include: Canes. Walkers. Scooters. Crutches. Turn on the lights when you go into a dark area. Replace any light bulbs as soon as they burn out. Set up your furniture so you have a clear path. Avoid moving your furniture around. If any of your floors are uneven, fix them. If there are any pets around you, be aware of where they are. Review your medicines with your doctor. Some medicines can make you feel dizzy. This can increase your chance of falling. Ask your doctor what other things that you can do to help prevent falls. This information is not intended to replace advice given to you by your health care provider. Make sure you discuss any questions you have with your health care provider. Document Released: 05/11/2009 Document Revised: 12/21/2015 Document Reviewed: 08/19/2014 Elsevier Interactive Patient Education  2017 [employer].

## 2021-03-13 NOTE — Telephone Encounter (Signed)
I spoke with [name], daughter, this morning. She and I discussed the recent labs and imaging results that the patient recently had.

## 2021-03-13 NOTE — Progress Notes (Signed)
I connected with Brittney Simpson today by telephone and verified that we were speaking about the correct person using two identifiers. Location patient: home Location provider: work Persons participating in the virtual visit: [name], Brittney Simpson (daughter), [name] LPN.   I discussed the limitations, risks, security and privacy concerns of performing an evaluation and management service by telephone and the availability of in person appointments. I also discussed with the patient that there may be a patient responsible charge related to this service. The patient expressed understanding and verbally consented to this telephonic visit.    Interactive audio and video telecommunications were attempted between this provider and patient, however failed, due to patient having technical difficulties OR patient did not have access to video capability.  We continued and completed visit with audio only.     Vital signs may be patient reported or missing.  Subjective:   Brittney Simpson is a 87 y.o. female who presents for an Initial Medicare Annual Wellness Visit.  Review of Systems     Cardiac Risk Factors include: advanced age (>2mn, >>58women);hypertension;sedentary lifestyle     Objective:    Today's Vitals   03/13/21 1516 03/13/21 1517  Weight: 120 lb (54.4 kg)   Height: '5\' 5"'$  (1.651 m)   PainSc:  7    Body mass index is 19.97 kg/m.  Advanced Directives 03/13/2021 02/06/2021 01/24/2021 11/28/2020 07/11/2020 03/23/2020 03/21/2020  Does Patient Have a Medical Advance Directive? Yes No No Yes Yes Yes Yes  Type of [place]amed[place] - - [place] will - -  Does patient want to make changes to medical advance directive? - - - - - - -  Copy of [place] Chart? No - copy requested - - - - - -  Would patient like information on creating a medical  advance directive? - - No - Patient declined - - - -    Current Medications (verified) Outpatient Encounter Medications as of 03/13/2021  Medication Sig   acetaminophen (TYLENOL) 500 MG tablet Take 1,000 mg by mouth daily as needed for moderate pain or headache.   amLODipine (NORVASC) 5 MG tablet Take 1 tablet (5 mg total) by mouth daily.   clonazePAM (KLONOPIN) 0.5 MG tablet Take 0.5 tablets (0.25 mg total) by mouth 2 (two) times daily.   Cranberry 500 MG CAPS Take 500 mg by mouth daily.    Ergocalciferol (VITAMIN D2) 2000 UNITS TABS Take 2,000 Units by mouth daily with lunch.    furosemide (LASIX) 20 MG tablet TAKE 1 and 1/2 TABLETS BY MOUTH ONCE DAILY   latanoprost (XALATAN) 0.005 % ophthalmic solution Place 1 drop into both eyes at bedtime.    levothyroxine (SYNTHROID) 112 MCG tablet TAKE 1 TABLET BY MOUTH ONCE DAILY ON AN EMPTY STOMACH. WAIT 30 MINS BEFORE TAKING OTHER MEDS.   lisinopril (ZESTRIL) 40 MG tablet Take 1 tablet (40 mg total) by mouth daily.   Melatonin 10 MG TABS Take 10 mg by mouth at bedtime.   metoprolol succinate (TOPROL-XL) 100 MG 24 hr tablet TAKE 1 TABLET BY MOUTH ONCE DAILY WITH FOOD   omeprazole (PRILOSEC) 20 MG capsule Take 1 capsule (20 mg total) by mouth daily.   sucralfate (CARAFATE) 1 g tablet Take 1 tablet (1 g total) by mouth 4 (four) times daily -  with meals and at bedtime.   sulfamethoxazole-trimethoprim (BACTRIM) 400-80 MG tablet Take 1 tablet by mouth  daily.   tamsulosin (FLOMAX) 0.4 MG CAPS capsule Take 1 capsule (0.4 mg total) by mouth daily.   traMADol (ULTRAM) 50 MG tablet Take 1 tablet (50 mg total) by mouth 3 (three) times daily.   traZODone (DESYREL) 50 MG tablet Take 1 tablet (50 mg total) by mouth at bedtime.   No facility-administered encounter medications on file as of 03/13/2021.    Allergies (verified) Patient has no known allergies.   History: Past Medical History:  Diagnosis Date   Anxiety    Back ache    Hiatal hernia     Hyperlipidemia    Hypertension    Hypothyroid    Osteopenia    Thyroid disease    UTI (urinary tract infection)    Past Surgical History:  Procedure Laterality Date   HEMORRHOID SURGERY     HERNIA REPAIR     HERNIA REPAIR     KYPHOPLASTY N/A 01/23/2017   Procedure: [id number];  Surgeon: Hessie Knows, MD;  Location: [facility] ORS;  Service: Orthopedics;  Laterality: N/A;   KYPHOPLASTY N/A 01/25/2020   Procedure: L4 KYPHOPLASTY;  Surgeon: Hessie Knows, MD;  Location: [facility] OR[facility];  Service: Orthopedics;  Laterality: N/A;   Family History  Problem Relation Age of Onset   Stroke Mother    Cancer Father    Social History   Socioeconomic History   Marital status: Widowed    Spouse name: Not on file   Number of children: Not on file   Years of education: Not on file   Highest education level: Not on file  Occupational History   Not on file  Tobacco Use   Smoking status: Never   Smokeless tobacco: Never  Vaping Use   Vaping Use: Never used  Substance and Sexual Activity   Alcohol use: No    Alcohol/week: 0.0 standard drinks   Drug use: No   Sexual activity: Not Currently    Birth control/protection: Injection  Other Topics Concern   Not on file  Social History Narrative   Not on file   Social Determinants of Health   Financial Resource Strain: Low Risk    Difficulty of Paying Living Expenses: Not hard at all  Food Insecurity: No Food Insecurity   Worried About Charity fundraiser in the Last Year: Never true   [place] in the Last Year: Never true  Transportation Needs: No Transportation Needs   Lack of Transportation (Medical): No   Lack of Transportation (Non-Medical): No  Physical Activity: Inactive   Days of Exercise per Week: 0 days   Minutes of Exercise per Session: 0 min  Stress: No Stress Concern Present   Feeling of Stress : Not at all  Social Connections: Not on file    Tobacco Counseling Counseling given: Not Answered   Clinical  Intake:  Pre-visit preparation completed: Yes  Pain : 0-10 Pain Score: 7  Pain Type: Acute pain Pain Location: Abdomen Pain Descriptors / Indicators: Aching Pain Onset: 1 to 4 weeks ago Pain Frequency: Intermittent     Nutritional Status: BMI of 19-24  Normal Nutritional Risks: Nausea/ vomitting/ diarrhea (some nausea due to gall stones) Diabetes: No  How often do you need to have someone help you when you read instructions, pamphlets, or other written materials from your doctor or pharmacy?: 1 - Never  Diabetic? no  Interpreter Needed?: No  Information entered by :: NAllen LPN   Activities of Daily Living In your present state of health, do you have  any difficulty performing the following activities: 03/13/2021  Hearing? Y  Vision? N  Difficulty concentrating or making decisions? N  Walking or climbing stairs? N  Dressing or bathing? N  Doing errands, shopping? Y  Preparing Food and eating ? N  Using the Toilet? N  In the past six months, have you accidently leaked urine? Y  Do you have problems with loss of bowel control? N  Managing your Medications? Y  Comment daughter manages  Managing your Finances? N  Housekeeping or managing your Housekeeping? N  Some recent data might be hidden    Patient Care Team: Olin Hauser, DO as PCP - General (Family Medicine)  Indicate any recent Medical Services you may have received from other than [facility] providers in the past year (date may be approximate).     Assessment:   This is a routine wellness examination for Rodessa.  Hearing/Vision screen No results found.  Dietary issues and exercise activities discussed: Current Exercise Habits: The patient does not participate in regular exercise at present   Goals Addressed             This Visit's Progress    Patient Stated       03/13/2021, get gall stones taken care of       Depression Screen [facility] 2/9 Scores 03/13/2021 03/07/2021 02/06/2021 01/24/2021  11/28/2020 10/09/2020 07/11/2020  PHQ - 2 Score 0 0 0 0 0 0 0  PHQ- 9 Score - 2 - - - - -  Exception Documentation - - - - - - -    Fall Risk Fall Risk  03/13/2021 03/07/2021 02/06/2021 01/24/2021 11/28/2020  Falls in the past year? 0 0 0 0 0  Number falls in past yr: - 0 - - -  Injury with Fall? - 0 - - -  Comment - - - - -  Risk Factor Category  - - - - -  Risk for fall due to : Medication side effect - - - -  Follow up Falls evaluation completed;Education provided;Falls prevention discussed Falls evaluation completed - - -    FALL RISK PREVENTION PERTAINING TO THE HOME:  Any stairs in or around the home? Yes  If so, are there any without handrails? No  Home free of loose throw rugs in walkways, pet beds, electrical cords, etc? Yes  Adequate lighting in your home to reduce risk of falls? Yes   ASSISTIVE DEVICES UTILIZED TO PREVENT FALLS:  Life alert? No  Use of a cane, walker or w/c? Yes  Grab bars in the bathroom? Yes  Shower chair or bench in shower? Yes  Elevated toilet seat or a handicapped toilet? Yes   TIMED UP AND GO:  Was the test performed? No .      Cognitive Function:        Immunizations Immunization History  Administered Date(s) Administered   Fluad Quad(high Dose 65+) 05/12/2019, 05/03/2020   Influenza, High Dose Seasonal PF 04/23/2016, 05/02/2017, 06/16/2018   Pneumococcal Conjugate-13 04/12/2014   Pneumococcal Polysaccharide-23 07/21/2015, 07/21/2015   Tdap 04/12/2014, 01/15/2020    TDAP status: Up to date  Flu Vaccine status: Due, Education has been provided regarding the importance of this vaccine. Advised may receive this vaccine at local pharmacy or Health Dept. Aware to provide a copy of the vaccination record if obtained from local pharmacy or Health Dept. Verbalized acceptance and understanding.  Pneumococcal vaccine status: Up to date  Covid-19 vaccine status: Declined, Education has been provided regarding the importance  of this vaccine but  patient still declined. Advised may receive this vaccine at local pharmacy or Health Dept.or vaccine clinic. Aware to provide a copy of the vaccination record if obtained from local pharmacy or Health Dept. Verbalized acceptance and understanding.  Qualifies for Shingles Vaccine? Yes   Zostavax completed No   Shingrix Completed?: No.    Education has been provided regarding the importance of this vaccine. Patient has been advised to call insurance company to determine out of pocket expense if they have not yet received this vaccine. Advised may also receive vaccine at local pharmacy or Health Dept. Verbalized acceptance and understanding.  Screening Tests Health Maintenance  Topic Date Due   COVID-19 Vaccine (1) Never done   Zoster Vaccines- Shingrix (1 of 2) Never done   INFLUENZA VACCINE  02/26/2021   TETANUS/TDAP  01/14/2030   DEXA SCAN  Completed   PNA vac Low Risk Adult  Completed   HPV VACCINES  Aged Out    Health Maintenance  Health Maintenance Due  Topic Date Due   COVID-19 Vaccine (1) Never done   Zoster Vaccines- Shingrix (1 of 2) Never done   INFLUENZA VACCINE  02/26/2021    Colorectal cancer screening: No longer required.   Mammogram status: No longer required due to age.  Bone Density status: Completed 05/15/2011.   Lung Cancer Screening: (Low Dose CT Chest recommended if Age 29-80 years, 30 pack-year currently smoking OR have quit w/in 15years.) does not qualify.   Lung Cancer Screening Referral: no  Additional Screening:  Hepatitis C Screening: does not qualify;   Vision Screening: Recommended annual ophthalmology exams for early detection of glaucoma and other disorders of the eye. Is the patient up to date with their annual eye exam?  Yes  Who is the provider or what is the name of the office in which the patient attends annual eye exams? [facility] If pt is not established with a provider, would they like to be referred to a provider to  establish care? No .   Dental Screening: Recommended annual dental exams for proper oral hygiene  Community Resource Referral / Chronic Care Management: CRR required this visit?  No   CCM required this visit?  No      Plan:     I have personally reviewed and noted the following in the patient's chart:   Medical and social history Use of alcohol, tobacco or illicit drugs  Current medications and supplements including opioid prescriptions. Patient is currently taking opioid prescriptions. Information provided to patient regarding non-opioid alternatives. Patient advised to discuss non-opioid treatment plan with their provider. Functional ability and status Nutritional status Physical activity Advanced directives List of other physicians Hospitalizations, surgeries, and ER visits in previous 12 months Vitals Screenings to include cognitive, depression, and falls Referrals and appointments  In addition, I have reviewed and discussed with patient certain preventive protocols, quality metrics, and best practice recommendations. A written personalized care plan for preventive services as well as general preventive health recommendations were provided to patient.     Kellie Simmering, LPN   [id number]   Nurse Notes: 6 CIT not administered. Daughter answered all questions.

## 2021-03-13 NOTE — Progress Notes (Signed)
Discussed results with patients daughter, [name]. They have their appt with surgery next wee. Recent labs have been discussed as well.

## 2021-03-13 NOTE — Addendum Note (Signed)
Addended by: Kellie Simmering on: 03/13/2021 03:48 PM   Modules accepted: Orders

## 2021-03-20 ENCOUNTER — Ambulatory Visit
Admission: RE | Admit: 2021-03-20 | Discharge: 2021-03-20 | Disposition: A | Discharge status: Home / Self Care | Payer: Medicare Other | Source: Ambulatory Visit | Attending: Surgery | Admitting: Surgery

## 2021-03-20 ENCOUNTER — Other Ambulatory Visit
Admission: RE | Admit: 2021-03-20 | Discharge: 2021-03-20 | Disposition: A | Discharge status: Home / Self Care | Payer: Medicare Other | Source: Home / Self Care | Attending: Surgery | Admitting: Surgery

## 2021-03-20 ENCOUNTER — Other Ambulatory Visit: Payer: Self-pay

## 2021-03-20 ENCOUNTER — Ambulatory Visit (INDEPENDENT_AMBULATORY_CARE_PROVIDER_SITE_OTHER): Payer: Medicare Other | Admitting: Surgery

## 2021-03-20 ENCOUNTER — Encounter: Payer: Self-pay | Admitting: Surgery

## 2021-03-20 VITALS — BP 171/78 | HR 62 | Temp 98.6°F | Ht 64.0 in | Wt 119.0 lb

## 2021-03-20 DIAGNOSIS — K8080 Other cholelithiasis without obstruction: Secondary | ICD-10-CM

## 2021-03-20 DIAGNOSIS — K8010 Calculus of gallbladder with chronic cholecystitis without obstruction: Secondary | ICD-10-CM | POA: Diagnosis not present

## 2021-03-20 DIAGNOSIS — I714 Abdominal aortic aneurysm, without rupture: Secondary | ICD-10-CM | POA: Diagnosis not present

## 2021-03-20 DIAGNOSIS — K8020 Calculus of gallbladder without cholecystitis without obstruction: Secondary | ICD-10-CM | POA: Diagnosis not present

## 2021-03-20 LAB — URINALYSIS, ROUTINE W REFLEX MICROSCOPIC
Bilirubin Urine: NEGATIVE
Glucose, UA: NEGATIVE mg/dL
Hgb urine dipstick: NEGATIVE
Ketones, ur: NEGATIVE mg/dL
Leukocytes,Ua: NEGATIVE
Nitrite: NEGATIVE
Protein, ur: NEGATIVE mg/dL
Specific Gravity, Urine: 1.011 (ref 1.005–1.030)
pH: 6 (ref 5.0–8.0)

## 2021-03-20 MED ORDER — IOHEXOL 350 MG/ML SOLN
60.0000 mL | Freq: Once | INTRAVENOUS | Status: AC | PRN
  Administered 2021-03-20: 60 mL via INTRAVENOUS

## 2021-03-20 NOTE — Progress Notes (Signed)
Patient ID: Brittney Simpson, female   DOB: [birth year], 87 y.o.   MRN: [id number]  Chief Complaint:  RUQ pain   History of Present Illness Brittney Simpson is a 87 y.o. female with progressive onset of right upper quadrant pain which radiates to the right flank and costovertebral angle.  Its not directly related to postprandial exacerbation, no specific food intolerance.  She denies any discoloration of her urine.  Denies fevers and chills.  There is associated nausea.  She has tried OTC Pepto PRN some relief. She takes Gas-X PRN with some relief.  The exacerbations/waves of pain come and go.  These have also increased in frequency/constancy as well.  Past Medical History Past Medical History:  Diagnosis Date   Anxiety    Back ache    Hiatal hernia    Hyperlipidemia    Hypertension    Hypothyroid    Osteopenia    Thyroid disease    UTI (urinary tract infection)       Past Surgical History:  Procedure Laterality Date   HEMORRHOID SURGERY     HERNIA REPAIR     HERNIA REPAIR     KYPHOPLASTY N/A 01/23/2017   Procedure: [id number];  Surgeon: Hessie Knows, MD;  Location: [facility];  Service: Orthopedics;  Laterality: N/A;   KYPHOPLASTY N/A 01/25/2020   Procedure: L4 KYPHOPLASTY;  Surgeon: Hessie Knows, MD;  Location: [facility];  Service: Orthopedics;  Laterality: N/A;    No Known Allergies  Current Outpatient Medications  Medication Sig Dispense Refill   acetaminophen (TYLENOL) 500 MG tablet Take 1,000 mg by mouth daily as needed for moderate pain or headache.     amLODipine (NORVASC) 5 MG tablet Take 1 tablet (5 mg total) by mouth daily. 90 tablet 3   clonazePAM (KLONOPIN) 0.5 MG tablet Take 0.5 tablets (0.25 mg total) by mouth 2 (two) times daily. 30 tablet 2   Cranberry 500 MG CAPS Take 500 mg by mouth daily.      Ergocalciferol (VITAMIN D2) 2000 UNITS TABS Take 2,000 Units by mouth daily with lunch.      furosemide (LASIX) 20 MG tablet TAKE 1 and 1/2 TABLETS BY MOUTH ONCE  DAILY 45 tablet 5   latanoprost (XALATAN) 0.005 % ophthalmic solution Place 1 drop into both eyes at bedtime.      levothyroxine (SYNTHROID) 112 MCG tablet TAKE 1 TABLET BY MOUTH ONCE DAILY ON AN EMPTY STOMACH. WAIT 30 MINS BEFORE TAKING OTHER MEDS. 90 tablet 0   lisinopril (ZESTRIL) 40 MG tablet Take 1 tablet (40 mg total) by mouth daily. 90 tablet 3   Melatonin 10 MG TABS Take 10 mg by mouth at bedtime.     metoprolol succinate (TOPROL-XL) 100 MG 24 hr tablet TAKE 1 TABLET BY MOUTH ONCE DAILY WITH FOOD 90 tablet 0   omeprazole (PRILOSEC) 20 MG capsule Take 1 capsule (20 mg total) by mouth daily. 90 capsule 0   sucralfate (CARAFATE) 1 g tablet Take 1 tablet (1 g total) by mouth 4 (four) times daily -  with meals and at bedtime. 40 tablet 1   sulfamethoxazole-trimethoprim (BACTRIM) 400-80 MG tablet Take 1 tablet by mouth daily.     tamsulosin (FLOMAX) 0.4 MG CAPS capsule Take 1 capsule (0.4 mg total) by mouth daily. 30 capsule 11   traMADol (ULTRAM) 50 MG tablet Take 1 tablet (50 mg total) by mouth 3 (three) times daily. 90 tablet 2   traZODone (DESYREL) 50 MG tablet Take 1 tablet (50 mg total)  by mouth at bedtime. 90 tablet 3   No current facility-administered medications for this visit.    Family History Family History  Problem Relation Age of Onset   Stroke Mother    Cancer Father       Social History Social History   Tobacco Use   Smoking status: Never   Smokeless tobacco: Never  Vaping Use   Vaping Use: Never used  Substance Use Topics   Alcohol use: No    Alcohol/week: 0.0 standard drinks   Drug use: No        Review of Systems  Constitutional:  Positive for weight loss.  HENT: Negative.    Eyes: Negative.   Respiratory: Negative.    Cardiovascular: Negative.   Gastrointestinal:  Positive for abdominal pain, constipation and nausea.  Genitourinary:  Positive for dysuria, frequency and urgency.  Skin: Negative.   Neurological: Negative.   Psychiatric/Behavioral:  Negative.       Physical Exam Blood pressure (!) 171/78, pulse 62, temperature 98.6 F (37 C), temperature source Oral, height '5\' 4"'$  (1.626 m), weight 119 lb (54 kg), SpO2 96 %. Last Weight  Most recent update: 03/20/2021  2:05 PM    Weight  54 kg (119 lb)             CONSTITUTIONAL: Well developed, appropriately responsive and aware without distress.   EYES: Sclera non-icteric.   EARS, NOSE, MOUTH AND THROAT: Mask worn.   Hearing is intact to voice.  NECK: Trachea is midline, and there is no jugular venous distension.  LYMPH NODES:  Lymph nodes in the neck are not enlarged. RESPIRATORY:  Lungs are clear, and breath sounds are equal bilaterally. Normal respiratory effort without pathologic use of accessory muscles. CARDIOVASCULAR: Heart is regular in rate and rhythm. GI: The abdomen is soft, nontender, and nondistended.  There is mild right upper quadrant tenderness, there were no palpable masses. I did not appreciate hepatosplenomegaly. There were normal bowel sounds. MUSCULOSKELETAL:  Symmetrical muscle tone appreciated in all four extremities.    SKIN: Skin turgor is normal. No pathologic skin lesions appreciated.  NEUROLOGIC:  Motor and sensation appear grossly normal.  Cranial nerves are grossly without defect. PSYCH:  Alert and oriented to person, place and time. Affect is appropriate for situation.  Data Reviewed I have personally reviewed what is currently available of the patient's imaging, recent labs and medical records.   Labs:  CBC Latest Ref Rng & Units 03/07/2021 05/16/2020 05/10/2019  WBC 3.8 - 10.8 Thousand/uL 5.3 4.8 5.3  Hemoglobin 11.7 - 15.5 g/dL 11.3(L) 12.7 12.7  Hematocrit 35.0 - 45.0 % 37.0 39.7 39.9  Platelets 140 - 400 Thousand/uL 238 236 204   CMP Latest Ref Rng & Units 03/07/2021 05/16/2020 05/10/2019  Glucose 65 - 99 mg/dL 97 101(H) 99  BUN 7 - 25 mg/dL '8 8 11  '$ Creatinine 0.60 - 0.95 mg/dL 0.75 0.79 0.97(H)  Sodium 135 - 146 mmol/L 135 136 137   Potassium 3.5 - 5.3 mmol/L 3.9 4.3 4.0  Chloride 98 - 110 mmol/L 95(L) 98 98  CO2 20 - 32 mmol/L '31 28 29  '$ Calcium 8.6 - 10.4 mg/dL 9.6 9.7 9.7  Total Protein 6.1 - 8.1 g/dL 6.7 6.7 7.1  Total Bilirubin 0.2 - 1.2 mg/dL 0.3 0.4 0.4  Alkaline Phos 33 - 130 U/L - - -  AST 10 - 35 U/L '10 13 12  '$ ALT 6 - 29 U/L '7 9 9   '$ Urinalysis negative.  Imaging: CLINICAL DATA:  Right upper quadrant pain   EXAM: ULTRASOUND ABDOMEN LIMITED RIGHT UPPER QUADRANT   COMPARISON:  08/19/2018 abdominal CT   FINDINGS: Gallbladder:   Cholelithiasis.  No gallbladder wall thickening or focal tenderness.   Common bile duct:   Diameter: 6 mm   Liver:   1 cm echogenic nodule in the left liver. There is an 8 mm nodule in a similar location on abdominal CT comparison, likely hemangioma. Portal vein is patent on color Doppler imaging with normal direction of blood flow towards the liver.   IMPRESSION: Cholelithiasis without signs of cholecystitis.     Electronically Signed   By: Monte Fantasia M.D.   On: 03/13/2021 09:15 Within last 24 hrs: CT Abdomen Pelvis W Contrast  Result Date: 03/20/2021 CLINICAL DATA:  Right upper quadrant pain radiating to right flank, nausea EXAM: CT ABDOMEN AND PELVIS WITH CONTRAST TECHNIQUE: Multidetector CT imaging of the abdomen and pelvis was performed using the standard protocol following bolus administration of intravenous contrast. CONTRAST:  65m OMNIPAQUE IOHEXOL 350 MG/ML SOLN COMPARISON:  03/13/2021, 08/19/2018 FINDINGS: Lower chest: No acute pleural or parenchymal lung disease. Mild cardiomegaly. Stable ectasia of the thoracic aorta. Small hiatal hernia. Hepatobiliary: Calcified gallstone again identified. Gallbladder is decompressed with no evidence of acute cholecystitis. Small cysts are again noted within the right lobe liver. No biliary duct dilation. Pancreas: Unremarkable. No pancreatic ductal dilatation or surrounding inflammatory changes. Spleen: Normal in  size without focal abnormality. Adrenals/Urinary Tract: Mild bilateral renal cortical atrophy. Stable bilateral extrarenal pelves, left larger than right. No urinary tract calculi or obstructive uropathy. The adrenals and bladder are unremarkable. Stomach/Bowel: No bowel obstruction or ileus. Normal appendix central lower abdomen. No bowel wall thickening or inflammatory change. Vascular/Lymphatic: There is marked ectasia of the thoracic aorta. At the level of the right renal artery, abdominal aortic aneurysm is again identified measuring up to 4.4 cm, previously measuring up to 3.8 cm. There is an additional infrarenal abdominal aortic aneurysm just proximal to the bifurcation, measuring up to 3.4 cm, previously having measured 2.9 cm. Normal caliber of the bilateral iliac arteries with extensive atherosclerosis again identified. No pathologic adenopathy within the abdomen or pelvis. Reproductive: Uterus and bilateral adnexa are unremarkable. Other: No free fluid or free gas.  No abdominal wall hernia. Musculoskeletal: Bones are diffusely osteopenic. No acute fractures. Chronic compression deformities and vertebral augmentation at T12 and L4. Stable grade 2 anterolisthesis of L5 on S1 with bilateral L5 spondylolysis. Reconstructed images demonstrate no additional findings. IMPRESSION: 1. Cholelithiasis without cholecystitis. 2. Hiatal hernia. 3. Abdominal aortic aneurysm, measuring up to 4.4 cm at the origin of the right renal artery and 3.4 cm just proximal to the aortic bifurcation. Recommend follow-up every 12 months and vascular consultation. This recommendation follows ACR consensus guidelines: White Paper of the ACR Incidental Findings Committee II on Vascular Findings. J Am Coll Radiol 2013;[id number] 4.  Aortic Atherosclerosis (ICD10-I70.0). Electronically Signed   By: MRanda NgoM.D.   On: 03/20/2021 17:35    Assessment    Probable chronic calculus cholecystitis. Patient Active Problem List    Diagnosis Date Noted   Chronic use of opiate for therapeutic purpose 12/31/2020   Benign hypertension with CKD (chronic kidney disease) stage III [place] 05/04/2020   DDD (degenerative disc disease), thoracolumbar 03/23/2020   Spasm of back muscles 03/21/2020   Somatic dysfunction of sacroiliac joints (Bilateral) 02/10/2020   Other spondylosis, sacral and sacrococcygeal region 02/10/2020   Hyponatremia 07/28/2019   Edema of lower extremity 07/28/2019  DDD (degenerative disc disease), lumbosacral 05/18/2019   Chronic pain syndrome 05/05/2019   Pharmacologic therapy 05/05/2019   T12 compression fracture, sequela 08/11/2018   Lumbar facet hypertrophy 08/11/2018   Lumbar lateral recess stenosis 08/11/2018   Lumbar foraminal stenosis 08/11/2018   Non-traumatic compression fracture of T7 thoracic vertebra, sequela 08/11/2018   Abnormal MRI, lumbar spine (01/10/2017) 05/05/2018   Ankle edema, bilateral 05/02/2017   AAA (abdominal aortic aneurysm) without rupture ([place]) 02/11/2017   Osteoporosis 01/08/2017   Bilateral hearing loss 11/19/2016   Hyperlipidemia 11/18/2016   Abnormal glucose 11/18/2016   Recurrent urinary tract infection 10/25/2016   GERD (gastroesophageal reflux disease) 10/25/2016   Presbycusis of both ears 10/25/2016   Spondylosis without myelopathy or radiculopathy, lumbosacral region 05/14/2016   Hypothyroidism 04/23/2016   Stage 3 chronic kidney disease ([place]) 10/11/2015   Skin lesion of scalp 10/11/2015   Chronic anxiety 10/11/2015   Insomnia 10/11/2015   Hypertension 10/11/2015   Spondylolisthesis of lumbosacral region (L2-3 and L5-S1) 08/22/2015   Lumbar spinal stenosis (9 mm at L3-4) 08/22/2015   Trochanteric bursitis (Right) 08/22/2015   Chronic hip pain (2ry area of Pain) (Right) 08/22/2015   Osteoarthritis of hip (Right) 08/22/2015   Chronic sacroiliac joint pain (Bilateral) (R>L) 08/22/2015   Chronic lumbar radicular pain (Right) 08/22/2015   Chronic lower  extremity pain (3ry area of Pain) (Right) 08/22/2015   Myofascial pain 08/22/2015   Encounter for chronic pain management 08/22/2015   Chronic low back pain (1ry area of Pain) (Bilateral) (R>L) w/o sciatica 06/03/2015   Long term current use of opiate analgesic 05/24/2015   Long term prescription opiate use 05/24/2015   Opiate use (15 MME/Day) 05/24/2015   Encounter for therapeutic drug level monitoring 05/24/2015   Lumbar facet syndrome (Bilateral) (R>L) 05/24/2015    Plan    We discussed the inherent operative risks with someone of this age, along with a mild degree of uncertainty regarding etiology of her symptoms.  I suspect that cholecystectomy will significantly improve this patient's quality of life, and the anesthetic risks are the larger consideration.  Robotic cholecystectomy This patient was seen and examined, and I concur with the H&P associated with this note.  This was discussed thoroughly.  Optimal plan is for robotic cholecystectomy.  Risks and benefits have been discussed with the patient which include but are not limited to anesthesia, bleeding, infection, biliary ductal injury or stenosis, other associated unanticipated injuries affiliated with laparoscopic surgery.  I believe there is the desire to proceed, interpreter utilized as needed.  Questions elicited and answered to satisfaction.  No guarantees ever expressed or implied.   Face-to-face time spent with the patient and accompanying care providers(if present) was 55 minutes, with more than 50% of the time spent counseling, educating, and coordinating care of the patient.    These notes generated with voice recognition software. I apologize for typographical errors.  [name] M.D., FACS 03/21/2021, 10:27 AM

## 2021-03-20 NOTE — H&P (View-Only) (Signed)
Patient ID: Brittney Simpson, female   DOB: Jan 31, 1928, 87 y.o.   MRN: [id number]  Chief Complaint:  RUQ pain   History of Present Illness Brittney Simpson is a 87 y.o. female with progressive onset of right upper quadrant pain which radiates to the right flank and costovertebral angle.  Its not directly related to postprandial exacerbation, no specific food intolerance.  She denies any discoloration of her urine.  Denies fevers and chills.  There is associated nausea.  She has tried OTC Pepto PRN some relief. She takes Gas-X PRN with some relief.  The exacerbations/waves of pain come and go.  These have also increased in frequency/constancy as well.  Past Medical History Past Medical History:  Diagnosis Date   Anxiety    Back ache    Hiatal hernia    Hyperlipidemia    Hypertension    Hypothyroid    Osteopenia    Thyroid disease    UTI (urinary tract infection)       Past Surgical History:  Procedure Laterality Date   HEMORRHOID SURGERY     HERNIA REPAIR     HERNIA REPAIR     KYPHOPLASTY N/A 01/23/2017   Procedure: [id number];  Surgeon: Hessie Knows, MD;  Location: [facility] ORS;  Service: Orthopedics;  Laterality: N/A;   KYPHOPLASTY N/A 01/25/2020   Procedure: L4 KYPHOPLASTY;  Surgeon: Hessie Knows, MD;  Location: [facility] ORS;  Service: Orthopedics;  Laterality: N/A;    No Known Allergies  Current Outpatient Medications  Medication Sig Dispense Refill   acetaminophen (TYLENOL) 500 MG tablet Take 1,000 mg by mouth daily as needed for moderate pain or headache.     amLODipine (NORVASC) 5 MG tablet Take 1 tablet (5 mg total) by mouth daily. 90 tablet 3   clonazePAM (KLONOPIN) 0.5 MG tablet Take 0.5 tablets (0.25 mg total) by mouth 2 (two) times daily. 30 tablet 2   Cranberry 500 MG CAPS Take 500 mg by mouth daily.      Ergocalciferol (VITAMIN D2) 2000 UNITS TABS Take 2,000 Units by mouth daily with lunch.      furosemide (LASIX) 20 MG tablet TAKE 1 and 1/2 TABLETS BY MOUTH ONCE  DAILY 45 tablet 5   latanoprost (XALATAN) 0.005 % ophthalmic solution Place 1 drop into both eyes at bedtime.      levothyroxine (SYNTHROID) 112 MCG tablet TAKE 1 TABLET BY MOUTH ONCE DAILY ON AN EMPTY STOMACH. WAIT 30 MINS BEFORE TAKING OTHER MEDS. 90 tablet 0   lisinopril (ZESTRIL) 40 MG tablet Take 1 tablet (40 mg total) by mouth daily. 90 tablet 3   Melatonin 10 MG TABS Take 10 mg by mouth at bedtime.     metoprolol succinate (TOPROL-XL) 100 MG 24 hr tablet TAKE 1 TABLET BY MOUTH ONCE DAILY WITH FOOD 90 tablet 0   omeprazole (PRILOSEC) 20 MG capsule Take 1 capsule (20 mg total) by mouth daily. 90 capsule 0   sucralfate (CARAFATE) 1 g tablet Take 1 tablet (1 g total) by mouth 4 (four) times daily -  with meals and at bedtime. 40 tablet 1   sulfamethoxazole-trimethoprim (BACTRIM) 400-80 MG tablet Take 1 tablet by mouth daily.     tamsulosin (FLOMAX) 0.4 MG CAPS capsule Take 1 capsule (0.4 mg total) by mouth daily. 30 capsule 11   traMADol (ULTRAM) 50 MG tablet Take 1 tablet (50 mg total) by mouth 3 (three) times daily. 90 tablet 2   traZODone (DESYREL) 50 MG tablet Take 1 tablet (50 mg total)  by mouth at bedtime. 90 tablet 3   No current facility-administered medications for this visit.    Family History Family History  Problem Relation Age of Onset   Stroke Mother    Cancer Father       Social History Social History   Tobacco Use   Smoking status: Never   Smokeless tobacco: Never  Vaping Use   Vaping Use: Never used  Substance Use Topics   Alcohol use: No    Alcohol/week: 0.0 standard drinks   Drug use: No        Review of Systems  Constitutional:  Positive for weight loss.  HENT: Negative.    Eyes: Negative.   Respiratory: Negative.    Cardiovascular: Negative.   Gastrointestinal:  Positive for abdominal pain, constipation and nausea.  Genitourinary:  Positive for dysuria, frequency and urgency.  Skin: Negative.   Neurological: Negative.   Psychiatric/Behavioral:  Negative.       Physical Exam Blood pressure (!) 171/78, pulse 62, temperature 98.6 F (37 C), temperature source Oral, height '5\' 4"'$  (1.626 m), weight 119 lb (54 kg), SpO2 96 %. Last Weight  Most recent update: 03/20/2021  2:05 PM    Weight  54 kg (119 lb)             CONSTITUTIONAL: Well developed, appropriately responsive and aware without distress.   EYES: Sclera non-icteric.   EARS, NOSE, MOUTH AND THROAT: Mask worn.   Hearing is intact to voice.  NECK: Trachea is midline, and there is no jugular venous distension.  LYMPH NODES:  Lymph nodes in the neck are not enlarged. RESPIRATORY:  Lungs are clear, and breath sounds are equal bilaterally. Normal respiratory effort without pathologic use of accessory muscles. CARDIOVASCULAR: Heart is regular in rate and rhythm. GI: The abdomen is soft, nontender, and nondistended.  There is mild right upper quadrant tenderness, there were no palpable masses. I did not appreciate hepatosplenomegaly. There were normal bowel sounds. MUSCULOSKELETAL:  Symmetrical muscle tone appreciated in all four extremities.    SKIN: Skin turgor is normal. No pathologic skin lesions appreciated.  NEUROLOGIC:  Motor and sensation appear grossly normal.  Cranial nerves are grossly without defect. PSYCH:  Alert and oriented to person, place and time. Affect is appropriate for situation.  Data Reviewed I have personally reviewed what is currently available of the patient's imaging, recent labs and medical records.   Labs:  CBC Latest Ref Rng & Units 03/07/2021 05/16/2020 05/10/2019  WBC 3.8 - 10.8 Thousand/uL 5.3 4.8 5.3  Hemoglobin 11.7 - 15.5 g/dL 11.3(L) 12.7 12.7  Hematocrit 35.0 - 45.0 % 37.0 39.7 39.9  Platelets 140 - 400 Thousand/uL 238 236 204   CMP Latest Ref Rng & Units 03/07/2021 05/16/2020 05/10/2019  Glucose 65 - 99 mg/dL 97 101(H) 99  BUN 7 - 25 mg/dL '8 8 11  '$ Creatinine 0.60 - 0.95 mg/dL 0.75 0.79 0.97(H)  Sodium 135 - 146 mmol/L 135 136 137   Potassium 3.5 - 5.3 mmol/L 3.9 4.3 4.0  Chloride 98 - 110 mmol/L 95(L) 98 98  CO2 20 - 32 mmol/L '31 28 29  '$ Calcium 8.6 - 10.4 mg/dL 9.6 9.7 9.7  Total Protein 6.1 - 8.1 g/dL 6.7 6.7 7.1  Total Bilirubin 0.2 - 1.2 mg/dL 0.3 0.4 0.4  Alkaline Phos 33 - 130 U/L - - -  AST 10 - 35 U/L '10 13 12  '$ ALT 6 - 29 U/L '7 9 9   '$ Urinalysis negative.  Imaging: CLINICAL DATA:  Right upper quadrant pain   EXAM: ULTRASOUND ABDOMEN LIMITED RIGHT UPPER QUADRANT   COMPARISON:  08/19/2018 abdominal CT   FINDINGS: Gallbladder:   Cholelithiasis.  No gallbladder wall thickening or focal tenderness.   Common bile duct:   Diameter: 6 mm   Liver:   1 cm echogenic nodule in the left liver. There is an 8 mm nodule in a similar location on abdominal CT comparison, likely hemangioma. Portal vein is patent on color Doppler imaging with normal direction of blood flow towards the liver.   IMPRESSION: Cholelithiasis without signs of cholecystitis.     Electronically Signed   By: Monte Fantasia M.D.   On: 03/13/2021 09:15 Within last 24 hrs: CT Abdomen Pelvis W Contrast  Result Date: 03/20/2021 CLINICAL DATA:  Right upper quadrant pain radiating to right flank, nausea EXAM: CT ABDOMEN AND PELVIS WITH CONTRAST TECHNIQUE: Multidetector CT imaging of the abdomen and pelvis was performed using the standard protocol following bolus administration of intravenous contrast. CONTRAST:  74m OMNIPAQUE IOHEXOL 350 MG/ML SOLN COMPARISON:  03/13/2021, 08/19/2018 FINDINGS: Lower chest: No acute pleural or parenchymal lung disease. Mild cardiomegaly. Stable ectasia of the thoracic aorta. Small hiatal hernia. Hepatobiliary: Calcified gallstone again identified. Gallbladder is decompressed with no evidence of acute cholecystitis. Small cysts are again noted within the right lobe liver. No biliary duct dilation. Pancreas: Unremarkable. No pancreatic ductal dilatation or surrounding inflammatory changes. Spleen: Normal in  size without focal abnormality. Adrenals/Urinary Tract: Mild bilateral renal cortical atrophy. Stable bilateral extrarenal pelves, left larger than right. No urinary tract calculi or obstructive uropathy. The adrenals and bladder are unremarkable. Stomach/Bowel: No bowel obstruction or ileus. Normal appendix central lower abdomen. No bowel wall thickening or inflammatory change. Vascular/Lymphatic: There is marked ectasia of the thoracic aorta. At the level of the right renal artery, abdominal aortic aneurysm is again identified measuring up to 4.4 cm, previously measuring up to 3.8 cm. There is an additional infrarenal abdominal aortic aneurysm just proximal to the bifurcation, measuring up to 3.4 cm, previously having measured 2.9 cm. Normal caliber of the bilateral iliac arteries with extensive atherosclerosis again identified. No pathologic adenopathy within the abdomen or pelvis. Reproductive: Uterus and bilateral adnexa are unremarkable. Other: No free fluid or free gas.  No abdominal wall hernia. Musculoskeletal: Bones are diffusely osteopenic. No acute fractures. Chronic compression deformities and vertebral augmentation at T12 and L4. Stable grade 2 anterolisthesis of L5 on S1 with bilateral L5 spondylolysis. Reconstructed images demonstrate no additional findings. IMPRESSION: 1. Cholelithiasis without cholecystitis. 2. Hiatal hernia. 3. Abdominal aortic aneurysm, measuring up to 4.4 cm at the origin of the right renal artery and 3.4 cm just proximal to the aortic bifurcation. Recommend follow-up every 12 months and vascular consultation. This recommendation follows ACR consensus guidelines: White Paper of the ACR Incidental Findings Committee II on Vascular Findings. J Am Coll Radiol 2013;[id number] 4.  Aortic Atherosclerosis (ICD10-I70.0). Electronically Signed   By: MRanda NgoM.D.   On: 03/20/2021 17:35    Assessment    Probable chronic calculus cholecystitis. Patient Active Problem List    Diagnosis Date Noted   Chronic use of opiate for therapeutic purpose 12/31/2020   Benign hypertension with CKD (chronic kidney disease) stage III ([place] 05/04/2020   DDD (degenerative disc disease), thoracolumbar 03/23/2020   Spasm of back muscles 03/21/2020   Somatic dysfunction of sacroiliac joints (Bilateral) 02/10/2020   Other spondylosis, sacral and sacrococcygeal region 02/10/2020   Hyponatremia 07/28/2019   Edema of lower extremity 07/28/2019  DDD (degenerative disc disease), lumbosacral 05/18/2019   Chronic pain syndrome 05/05/2019   Pharmacologic therapy 05/05/2019   T12 compression fracture, sequela 08/11/2018   Lumbar facet hypertrophy 08/11/2018   Lumbar lateral recess stenosis 08/11/2018   Lumbar foraminal stenosis 08/11/2018   Non-traumatic compression fracture of T7 thoracic vertebra, sequela 08/11/2018   Abnormal MRI, lumbar spine (01/10/2017) 05/05/2018   Ankle edema, bilateral 05/02/2017   AAA (abdominal aortic aneurysm) without rupture ([place]) 02/11/2017   Osteoporosis 01/08/2017   Bilateral hearing loss 11/19/2016   Hyperlipidemia 11/18/2016   Abnormal glucose 11/18/2016   Recurrent urinary tract infection 10/25/2016   GERD (gastroesophageal reflux disease) 10/25/2016   Presbycusis of both ears 10/25/2016   Spondylosis without myelopathy or radiculopathy, lumbosacral region 05/14/2016   Hypothyroidism 04/23/2016   Stage 3 chronic kidney disease ([place]) 10/11/2015   Skin lesion of scalp 10/11/2015   Chronic anxiety 10/11/2015   Insomnia 10/11/2015   Hypertension 10/11/2015   Spondylolisthesis of lumbosacral region (L2-3 and L5-S1) 08/22/2015   Lumbar spinal stenosis (9 mm at L3-4) 08/22/2015   Trochanteric bursitis (Right) 08/22/2015   Chronic hip pain (2ry area of Pain) (Right) 08/22/2015   Osteoarthritis of hip (Right) 08/22/2015   Chronic sacroiliac joint pain (Bilateral) (R>L) 08/22/2015   Chronic lumbar radicular pain (Right) 08/22/2015   Chronic lower  extremity pain (3ry area of Pain) (Right) 08/22/2015   Myofascial pain 08/22/2015   Encounter for chronic pain management 08/22/2015   Chronic low back pain (1ry area of Pain) (Bilateral) (R>L) w/o sciatica 06/03/2015   Long term current use of opiate analgesic 05/24/2015   Long term prescription opiate use 05/24/2015   Opiate use (15 MME/Day) 05/24/2015   Encounter for therapeutic drug level monitoring 05/24/2015   Lumbar facet syndrome (Bilateral) (R>L) 05/24/2015    Plan    We discussed the inherent operative risks with someone of this age, along with a mild degree of uncertainty regarding etiology of her symptoms.  I suspect that cholecystectomy will significantly improve this patient's quality of life, and the anesthetic risks are the larger consideration.  Robotic cholecystectomy This patient was seen and examined, and I concur with the H&P associated with this note.  This was discussed thoroughly.  Optimal plan is for robotic cholecystectomy.  Risks and benefits have been discussed with the patient which include but are not limited to anesthesia, bleeding, infection, biliary ductal injury or stenosis, other associated unanticipated injuries affiliated with laparoscopic surgery.  I believe there is the desire to proceed, interpreter utilized as needed.  Questions elicited and answered to satisfaction.  No guarantees ever expressed or implied.   Face-to-face time spent with the patient and accompanying care providers(if present) was 55 minutes, with more than 50% of the time spent counseling, educating, and coordinating care of the patient.    These notes generated with voice recognition software. I apologize for typographical errors.  [name] M.D., FACS 03/21/2021, 10:27 AM

## 2021-03-20 NOTE — Patient Instructions (Addendum)
You can head over to the medical mall at [facility]. You will need to drink the oral prep and may require for you to be there for a few hours.   If you have any concerns or questions, please feel free to call our office.   Gallbladder Eating Plan If you have a gallbladder condition, you may have trouble digesting fats. Eating a low-fat diet can help reduce your symptoms, and may be helpful before and after having surgery to remove your gallbladder (cholecystectomy). Your health care provider may recommend that you work with a diet and nutrition specialist (dietitian) to help you reduce the amount of fat in your diet. What are tips for following this plan? General guidelines Limit your fat intake to less than 30% of your total daily calories. If you eat around 1,800 calories each day, this is less than 60 grams (g) of fat per day. Fat is an important part of a healthy diet. Eating a low-fat diet can make it hard to maintain a healthy body weight. Ask your dietitian how much fat, calories, and other nutrients you need each day. Eat small, frequent meals throughout the day instead of three large meals. Drink at least 8-10 cups of fluid a day. Drink enough fluid to keep your urine clear or pale yellow. Limit alcohol intake to no more than 1 drink a day for nonpregnant women and 2 drinks a day for men. One drink equals 12 oz of beer, 5 oz of wine, or 1 oz of hard liquor. Reading food labels  Check Nutrition Facts on food labels for the amount of fat per serving. Choose foods with less than 3 grams of fat per serving.  Shopping Choose nonfat and low-fat healthy foods. Look for the words "nonfat," "low fat," or "fat free." Avoid buying processed or prepackaged foods. Cooking Cook using low-fat methods, such as baking, broiling, grilling, or boiling. Cook with small amounts of healthy fats, such as olive oil, grapeseed oil, canola oil, or sunflower oil. What foods are recommended? All fresh, frozen, or  canned fruits and vegetables. Whole grains. Low-fat or non-fat (skim) milk and yogurt. Lean meat, skinless poultry, fish, eggs, and beans. Low-fat protein supplement powders or drinks. Spices and herbs. What foods are not recommended? High-fat foods. These include baked goods, fast food, fatty cuts of meat, ice cream, french toast, sweet rolls, pizza, cheese bread, foods covered with butter, creamy sauces, or cheese. Fried foods. These include french fries, tempura, battered fish, breaded chicken, fried breads, and sweets. Foods with strong odors. Foods that cause bloating and gas. Summary A low-fat diet can be helpful if you have a gallbladder condition, or before and after gallbladder surgery. Limit your fat intake to less than 30% of your total daily calories. This is about 60 g of fat if you eat 1,800 calories each day. Eat small, frequent meals throughout the day instead of three large meals. This information is not intended to replace advice given to you by your health care provider. Make sure you discuss any questions you have with your healthcare provider. Document Revised: 03/02/2020 Document Reviewed: 03/02/2020 Elsevier Patient Education  2022 [employer].

## 2021-03-21 ENCOUNTER — Telehealth: Payer: Self-pay | Admitting: Surgery

## 2021-03-21 NOTE — Telephone Encounter (Signed)
Spoke with daughter, Arbie Cookey, she is informed of  Pre-Admission date/time, COVID Testing date and Surgery date.  Surgery Date: 03/23/21 Preadmission Testing Date: 03/22/21 (phone 8a-1p) Covid Testing Date: Not needed.    Patient has been made aware to call [phone], between 1-3:00pm the day before surgery, to find out what time to arrive for surgery.

## 2021-03-22 ENCOUNTER — Other Ambulatory Visit
Admission: RE | Admit: 2021-03-22 | Discharge: 2021-03-22 | Disposition: A | Discharge status: Home / Self Care | Payer: Medicare Other | Source: Ambulatory Visit | Attending: Surgery | Admitting: Surgery

## 2021-03-22 ENCOUNTER — Ambulatory Visit: Payer: Self-pay | Admitting: Surgery

## 2021-03-22 ENCOUNTER — Other Ambulatory Visit: Payer: Self-pay

## 2021-03-22 ENCOUNTER — Encounter
Admission: RE | Admit: 2021-03-22 | Discharge: 2021-03-22 | Disposition: A | Discharge status: Home / Self Care | Payer: Medicare Other | Source: Ambulatory Visit | Attending: Surgery | Admitting: Surgery

## 2021-03-22 DIAGNOSIS — Z01812 Encounter for preprocedural laboratory examination: Secondary | ICD-10-CM | POA: Diagnosis not present

## 2021-03-22 DIAGNOSIS — Z01810 Encounter for preprocedural cardiovascular examination: Secondary | ICD-10-CM | POA: Diagnosis not present

## 2021-03-22 DIAGNOSIS — K8010 Calculus of gallbladder with chronic cholecystitis without obstruction: Secondary | ICD-10-CM

## 2021-03-22 HISTORY — DX: Unspecified osteoarthritis, unspecified site: M19.90

## 2021-03-22 NOTE — Patient Instructions (Addendum)
Your procedure is scheduled on: tomorrow Report to .Registration desk in the medical mall then to the second floor surgery desk To find out your arrival time please call [phone] between 1PM - 3PM on today  Remember: Instructions that are not followed completely may result in serious medical risk,  up to and including death, or upon the discretion of your surgeon and anesthesiologist your  surgery may need to be rescheduled.     _X__ 1. Do not eat food after midnight the night before your procedure.                 No chewing gum or hard candies. You may drink clear liquids up to 2 hours                 before you are scheduled to arrive for your surgery- DO not drink clear                 liquids within 2 hours of the start of your surgery.                 Clear Liquids include:  water, apple juice without pulp, clear Gatorade, G2 or                  Gatorade Zero (avoid Red/Purple/Blue), Black Coffee or Tea (Do not add                 anything to coffee or tea).  __X__2.  On the morning of surgery brush your teeth with toothpaste and water, you                may rinse your mouth with mouthwash if you wish.  Do not swallow any toothpaste of mouthwash.     __ 3.  No Alcohol for 24 hours before or after surgery.   ___ 4.  Do Not Smoke or use e-cigarettes For 24 Hours Prior to Your Surgery.                 Do not use any chewable tobacco products for at least 6 hours prior to                 Surgery.  ___  5.  Do not use any recreational drugs (marijuana, cocaine, heroin, ecstasy,       MDMA or other) For at least one week prior to your surgery.            Combination of these drugs with anesthesia may have life threatening       results.  ____  6.  Bring all medications with you on the day of surgery if instructed.   ___x_  7.  Notify your doctor if there is any change in your medical condition      (cold, fever, infections).     Do not wear jewelry,  make-up, hairpins, clips or nail polish. Do not wear lotions, powders, or perfumes.  Do not shave 48 hours prior to surgery.  Do not bring valuables to the hospital.    [facility] is not responsible for any belongings or valuables.  Contacts, dentures or bridgework may not be worn into surgery. Leave your suitcase in the car. After surgery it may be brought to your room. For patients admitted to the hospital, discharge time is determined by your treatment team.   Patients discharged the day of surgery will not be allowed to drive home.   Make arrangements for  someone to be with you for the first 24 hours of your Same Day Discharge.    Please read over the following fact sheets that you were given:       __x__ Take these medicines the morning of surgery with A SIP OF WATER:    1. amLODipine (NORVASC) 5 MG tablet  2. clonazePAM (KLONOPIN) 0.5 MG tablet  3. levothyroxine (SYNTHROID) 112 MCG tablet  4.metoprolol succinate (TOPROL-XL) 100 MG 24 hr tablet  5.omeprazole (PRILOSEC) 20 MG capsule tonight and in the morning  6.tamsulosin (FLOMAX) 0.4 MG CAPS capsule  ____ Fleet Enema (as directed)   __x__ Use CHG Soap (or wipes) as directed  ____ Use Benzoyl Peroxide Gel as instructed  ____ Use inhalers on the day of surgery  ____ Stop metformin 2 days prior to surgery    ____ Take 1/2 of usual insulin dose the night before surgery. No insulin the morning          of surgery.   ____ Stop Coumadin/Plavix/aspirin on   ____ Stop Anti-inflammatories on    __x__ Stop supplements until after surgery.  Melatonin  ____ Bring C-Pap to the hospital.   Wear a comfortable dress or soft pants so there will be no pressure in the surgical area.   If you have any questions regarding your pre-procedure instructions,  Please call Pre-admit Testing at [phone]

## 2021-03-23 ENCOUNTER — Ambulatory Visit
Admission: RE | Admit: 2021-03-23 | Discharge: 2021-03-23 | Disposition: A | Discharge status: Home / Self Care | Payer: Medicare Other | Attending: Surgery | Admitting: Surgery

## 2021-03-23 ENCOUNTER — Ambulatory Visit: Payer: Medicare Other | Admitting: Urgent Care

## 2021-03-23 ENCOUNTER — Encounter
Admission: RE | Disposition: A | Discharge status: Home / Self Care | Payer: Self-pay | Source: Home / Self Care | Attending: Surgery

## 2021-03-23 ENCOUNTER — Encounter: Payer: Self-pay | Admitting: Surgery

## 2021-03-23 ENCOUNTER — Other Ambulatory Visit: Payer: Self-pay

## 2021-03-23 DIAGNOSIS — Z79899 Other long term (current) drug therapy: Secondary | ICD-10-CM | POA: Diagnosis not present

## 2021-03-23 DIAGNOSIS — E039 Hypothyroidism, unspecified: Secondary | ICD-10-CM | POA: Diagnosis not present

## 2021-03-23 DIAGNOSIS — K828 Other specified diseases of gallbladder: Secondary | ICD-10-CM | POA: Insufficient documentation

## 2021-03-23 DIAGNOSIS — K8010 Calculus of gallbladder with chronic cholecystitis without obstruction: Secondary | ICD-10-CM | POA: Insufficient documentation

## 2021-03-23 DIAGNOSIS — K8020 Calculus of gallbladder without cholecystitis without obstruction: Secondary | ICD-10-CM | POA: Diagnosis not present

## 2021-03-23 SURGERY — CHOLECYSTECTOMY, ROBOT-ASSISTED, LAPAROSCOPIC
Anesthesia: General

## 2021-03-23 MED ORDER — CEFAZOLIN SODIUM-DEXTROSE 2-4 GM/100ML-% IV SOLN
INTRAVENOUS | Status: AC
  Filled 2021-03-23: qty 100

## 2021-03-23 MED ORDER — PHENYLEPHRINE HCL (PRESSORS) 10 MG/ML IV SOLN
INTRAVENOUS | Status: AC
  Filled 2021-03-23: qty 1

## 2021-03-23 MED ORDER — FENTANYL CITRATE (PF) 100 MCG/2ML IJ SOLN
INTRAMUSCULAR | Status: AC
  Filled 2021-03-23: qty 2

## 2021-03-23 MED ORDER — INDOCYANINE GREEN 25 MG IV SOLR
2.5000 mg | Freq: Once | INTRAVENOUS | Status: AC
  Administered 2021-03-23: 2.5 mg via INTRAVENOUS
  Filled 2021-03-23: qty 1

## 2021-03-23 MED ORDER — CHLORHEXIDINE GLUCONATE CLOTH 2 % EX PADS
6.0000 | MEDICATED_PAD | Freq: Once | CUTANEOUS | Status: AC
  Administered 2021-03-23: 6 via TOPICAL

## 2021-03-23 MED ORDER — PROPOFOL 10 MG/ML IV BOLUS
INTRAVENOUS | Status: AC
  Filled 2021-03-23: qty 40

## 2021-03-23 MED ORDER — LIDOCAINE HCL (CARDIAC) PF 100 MG/5ML IV SOSY
PREFILLED_SYRINGE | INTRAVENOUS | Status: DC | PRN
  Administered 2021-03-23: 60 mg via INTRAVENOUS

## 2021-03-23 MED ORDER — BUPIVACAINE LIPOSOME 1.3 % IJ SUSP
20.0000 mL | Freq: Once | INTRAMUSCULAR | Status: DC
Start: 2021-03-23 — End: 2021-03-23

## 2021-03-23 MED ORDER — CEFAZOLIN SODIUM-DEXTROSE 2-4 GM/100ML-% IV SOLN
2.0000 g | INTRAVENOUS | Status: AC
  Administered 2021-03-23: 2 g via INTRAVENOUS

## 2021-03-23 MED ORDER — ONDANSETRON HCL 4 MG/2ML IJ SOLN
INTRAMUSCULAR | Status: DC | PRN
  Administered 2021-03-23: 4 mg via INTRAVENOUS

## 2021-03-23 MED ORDER — CELECOXIB 200 MG PO CAPS
ORAL_CAPSULE | ORAL | Status: AC
  Administered 2021-03-23: 200 mg via ORAL
  Filled 2021-03-23: qty 1

## 2021-03-23 MED ORDER — IBUPROFEN 200 MG PO TABS: 200.0000 mg | ORAL_TABLET | Freq: Four times a day (QID) | ORAL | 2 refills | Status: AC | PRN

## 2021-03-23 MED ORDER — ACETAMINOPHEN 500 MG PO TABS: 1000.0000 mg | ORAL_TABLET | ORAL | Status: AC

## 2021-03-23 MED ORDER — SUGAMMADEX SODIUM 200 MG/2ML IV SOLN
INTRAVENOUS | Status: DC | PRN
  Administered 2021-03-23: 150 mg via INTRAVENOUS

## 2021-03-23 MED ORDER — GABAPENTIN 300 MG PO CAPS
ORAL_CAPSULE | ORAL | Status: AC
  Administered 2021-03-23: 300 mg via ORAL
  Filled 2021-03-23: qty 1

## 2021-03-23 MED ORDER — ROCURONIUM BROMIDE 100 MG/10ML IV SOLN
INTRAVENOUS | Status: DC | PRN
Start: 2021-03-23 — End: 2021-03-23
  Administered 2021-03-23: 30 mg via INTRAVENOUS

## 2021-03-23 MED ORDER — FENTANYL CITRATE (PF) 100 MCG/2ML IJ SOLN
25.0000 ug | INTRAMUSCULAR | Status: DC | PRN
  Administered 2021-03-23 (×3): 25 ug via INTRAVENOUS

## 2021-03-23 MED ORDER — CHLORHEXIDINE GLUCONATE 0.12 % MT SOLN
OROMUCOSAL | Status: AC
  Administered 2021-03-23: 15 mL via OROMUCOSAL
  Filled 2021-03-23: qty 15

## 2021-03-23 MED ORDER — LACTATED RINGERS IV SOLN: INTRAVENOUS | Status: DC

## 2021-03-23 MED ORDER — OXYCODONE HCL 5 MG PO TABS
5.0000 mg | ORAL_TABLET | Freq: Once | ORAL | Status: AC
  Administered 2021-03-23: 5 mg via ORAL

## 2021-03-23 MED ORDER — EPHEDRINE SULFATE 50 MG/ML IJ SOLN
INTRAMUSCULAR | Status: DC | PRN
Start: 2021-03-23 — End: 2021-03-23
  Administered 2021-03-23 (×3): 5 mg via INTRAVENOUS

## 2021-03-23 MED ORDER — OXYCODONE HCL 5 MG PO TABS
ORAL_TABLET | ORAL | Status: AC
  Filled 2021-03-23: qty 1

## 2021-03-23 MED ORDER — FENTANYL CITRATE (PF) 100 MCG/2ML IJ SOLN
INTRAMUSCULAR | Status: DC | PRN
  Administered 2021-03-23: 50 ug via INTRAVENOUS

## 2021-03-23 MED ORDER — CELECOXIB 200 MG PO CAPS
200.0000 mg | ORAL_CAPSULE | ORAL | Status: AC
Start: 2021-03-23 — End: 2021-03-23

## 2021-03-23 MED ORDER — PHENYLEPHRINE HCL (PRESSORS) 10 MG/ML IV SOLN
INTRAVENOUS | Status: DC | PRN
Start: 2021-03-23 — End: 2021-03-23
  Administered 2021-03-23: 200 ug via INTRAVENOUS

## 2021-03-23 MED ORDER — GLYCOPYRROLATE 0.2 MG/ML IJ SOLN
INTRAMUSCULAR | Status: DC | PRN
Start: 2021-03-23 — End: 2021-03-23
  Administered 2021-03-23: .2 mg via INTRAVENOUS

## 2021-03-23 MED ORDER — FENTANYL CITRATE (PF) 100 MCG/2ML IJ SOLN
INTRAMUSCULAR | Status: AC
  Administered 2021-03-23: 25 ug via INTRAVENOUS
  Filled 2021-03-23: qty 2

## 2021-03-23 MED ORDER — BUPIVACAINE-EPINEPHRINE (PF) 0.25% -1:200000 IJ SOLN
INTRAMUSCULAR | Status: AC
  Filled 2021-03-23: qty 30

## 2021-03-23 MED ORDER — GABAPENTIN 300 MG PO CAPS
300.0000 mg | ORAL_CAPSULE | ORAL | Status: AC
Start: 2021-03-23 — End: 2021-03-23

## 2021-03-23 MED ORDER — BUPIVACAINE-EPINEPHRINE (PF) 0.25% -1:200000 IJ SOLN
INTRAMUSCULAR | Status: DC | PRN
  Administered 2021-03-23: 30 mL

## 2021-03-23 MED ORDER — ORAL CARE MOUTH RINSE: 15.0000 mL | Freq: Once | OROMUCOSAL | Status: AC

## 2021-03-23 MED ORDER — ACETAMINOPHEN 500 MG PO TABS
ORAL_TABLET | ORAL | Status: AC
  Administered 2021-03-23: 1000 mg via ORAL
  Filled 2021-03-23: qty 2

## 2021-03-23 MED ORDER — PROPOFOL 10 MG/ML IV BOLUS
INTRAVENOUS | Status: DC | PRN
  Administered 2021-03-23: 60 mg via INTRAVENOUS

## 2021-03-23 MED ORDER — ONDANSETRON HCL 4 MG/2ML IJ SOLN: 4.0000 mg | Freq: Once | INTRAMUSCULAR | Status: DC | PRN

## 2021-03-23 MED ORDER — DEXAMETHASONE SODIUM PHOSPHATE 10 MG/ML IJ SOLN
INTRAMUSCULAR | Status: DC | PRN
  Administered 2021-03-23: 5 mg via INTRAVENOUS

## 2021-03-23 MED ORDER — CHLORHEXIDINE GLUCONATE 0.12 % MT SOLN: 15.0000 mL | Freq: Once | OROMUCOSAL | Status: AC

## 2021-03-23 MED ORDER — BUPIVACAINE LIPOSOME 1.3 % IJ SUSP
INTRAMUSCULAR | Status: AC
  Filled 2021-03-23: qty 20

## 2021-03-23 SURGICAL SUPPLY — 50 items
ADH SKN CLS APL DERMABOND .7 (GAUZE/BANDAGES/DRESSINGS) ×2
APL PRP STRL LF DISP 70% ISPRP (MISCELLANEOUS) ×2
BAG SPEC RTRVL LRG 6X4 10 (ENDOMECHANICALS) ×2
CANISTER SUCT 1200ML W/VALVE (MISCELLANEOUS) IMPLANT
CANNULA CAP OBTURATR AIRSEAL 8 (CAP) ×3 IMPLANT
CHLORAPREP W/TINT 26 (MISCELLANEOUS) ×3 IMPLANT
CLIP VESOLOCK LG 6/CT PURPLE (CLIP) ×3 IMPLANT
COVER TIP SHEARS 8 DVNC (MISCELLANEOUS) ×2 IMPLANT
COVER TIP SHEARS 8MM DA VINCI (MISCELLANEOUS) ×1
DECANTER SPIKE VIAL GLASS SM (MISCELLANEOUS) ×3 IMPLANT
DEFOGGER SCOPE WARMER CLEARIFY (MISCELLANEOUS) ×3 IMPLANT
DERMABOND ADVANCED (GAUZE/BANDAGES/DRESSINGS) ×1
DERMABOND ADVANCED .7 DNX12 (GAUZE/BANDAGES/DRESSINGS) ×2 IMPLANT
DRAPE ARM DVNC X/XI (DISPOSABLE) ×8 IMPLANT
DRAPE COLUMN DVNC XI (DISPOSABLE) ×2 IMPLANT
DRAPE DA VINCI XI ARM (DISPOSABLE) ×8
DRAPE DA VINCI XI COLUMN (DISPOSABLE) ×1
ELECT CAUTERY BLADE 6.4 (BLADE) ×3 IMPLANT
GAUZE 4X4 16PLY ~~LOC~~+RFID DBL (SPONGE) ×3 IMPLANT
GLOVE SURG ORTHO LTX SZ7.5 (GLOVE) ×18 IMPLANT
GOWN STRL REUS W/ TWL LRG LVL3 (GOWN DISPOSABLE) ×8 IMPLANT
GOWN STRL REUS W/TWL LRG LVL3 (GOWN DISPOSABLE) ×12
GRASPER SUT TROCAR 14GX15 (MISCELLANEOUS) IMPLANT
INFUSOR MANOMETER BAG 3000ML (MISCELLANEOUS) IMPLANT
IRRIGATION STRYKERFLOW (MISCELLANEOUS) IMPLANT
IRRIGATOR STRYKERFLOW (MISCELLANEOUS)
IRRIGATOR SUCT 8 DISP DVNC XI (IRRIGATION / IRRIGATOR) IMPLANT
IRRIGATOR SUCTION 8MM XI DISP (IRRIGATION / IRRIGATOR)
IV NS IRRIG 3000ML ARTHROMATIC (IV SOLUTION) IMPLANT
KIT PINK PAD W/HEAD ARE REST (MISCELLANEOUS) ×3
KIT PINK PAD W/HEAD ARM REST (MISCELLANEOUS) ×2 IMPLANT
KIT TURNOVER KIT A (KITS) ×3 IMPLANT
LABEL OR SOLS (LABEL) ×3 IMPLANT
MANIFOLD NEPTUNE II (INSTRUMENTS) ×3 IMPLANT
NEEDLE HYPO 22GX1.5 SAFETY (NEEDLE) ×3 IMPLANT
NEEDLE INSUFFLATION 14GA 120MM (NEEDLE) IMPLANT
NS IRRIG 500ML POUR BTL (IV SOLUTION) ×3 IMPLANT
PACK LAP CHOLECYSTECTOMY (MISCELLANEOUS) ×3 IMPLANT
PENCIL ELECTRO HAND CTR (MISCELLANEOUS) ×3 IMPLANT
POUCH SPECIMEN RETRIEVAL 10MM (ENDOMECHANICALS) ×3 IMPLANT
SEAL CANN UNIV 5-8 DVNC XI (MISCELLANEOUS) ×6 IMPLANT
SEAL XI 5MM-8MM UNIVERSAL (MISCELLANEOUS) ×3
SET TUBE FILTERED XL AIRSEAL (SET/KITS/TRAYS/PACK) ×3 IMPLANT
SOLUTION ELECTROLUBE (MISCELLANEOUS) ×3 IMPLANT
SUT MNCRL 4-0 (SUTURE) ×3
SUT MNCRL 4-0 27XMFL (SUTURE) ×2
SUT VICRYL 0 AB UR-6 (SUTURE) ×3 IMPLANT
SUTURE MNCRL 4-0 27XMF (SUTURE) ×2 IMPLANT
TROCAR Z-THREAD FIOS 11X100 BL (TROCAR) IMPLANT
WATER STERILE IRR 500ML POUR (IV SOLUTION) IMPLANT

## 2021-03-23 NOTE — Anesthesia Procedure Notes (Signed)
Procedure Name: Intubation Date/Time: 03/23/2021 7:43 AM Performed by: Posey Pronto, Davi Rotan, CRNA Pre-anesthesia Checklist: Patient identified, Patient being monitored, Timeout performed, Emergency Drugs available and Suction available Patient Re-evaluated:Patient Re-evaluated prior to induction Oxygen Delivery Method: Circle system utilized Preoxygenation: Pre-oxygenation with 100% oxygen Induction Type: IV induction Ventilation: Mask ventilation without difficulty and Oral airway inserted - appropriate to patient size Laryngoscope Size: 3 and McGraph Grade View: Grade I Tube type: Oral Tube size: 6.5 mm Number of attempts: 1 Airway Equipment and Method: Stylet Placement Confirmation: ETT inserted through vocal cords under direct vision, positive ETCO2 and breath sounds checked- equal and bilateral Secured at: 20 cm Tube secured with: Tape Dental Injury: Teeth and Oropharynx as per pre-operative assessment  Comments: Eyes taped prior to intubation

## 2021-03-23 NOTE — Discharge Instructions (Signed)
AMBULATORY SURGERY  ?DISCHARGE INSTRUCTIONS ? ? ?The drugs that you were given will stay in your system until tomorrow so for the next 24 hours you should not: ? ?Drive an automobile ?Make any legal decisions ?Drink any alcoholic beverage ? ? ?You may resume regular meals tomorrow.  Today it is better to start with liquids and gradually work up to solid foods. ? ?You may eat anything you prefer, but it is better to start with liquids, then soup and crackers, and gradually work up to solid foods. ? ? ?Please notify your doctor immediately if you have any unusual bleeding, trouble breathing, redness and pain at the surgery site, drainage, fever, or pain not relieved by medication. ? ? ? ?Additional Instructions: ? ? ? ?Please contact your physician with any problems or Same Day Surgery at [phone], Monday through Friday 6 am to 4 pm, or [place] [facility] [place] Main number at [phone].  ?

## 2021-03-23 NOTE — Transfer of Care (Signed)
Immediate Anesthesia Transfer of Care Note  Patient: CAITLIN COMIS  Procedure(s) Performed: XI ROBOTIC ASSISTED LAPAROSCOPIC CHOLECYSTECTOMY INDOCYANINE GREEN FLUORESCENCE IMAGING (ICG)  Patient Location: PACU  Anesthesia Type:General  Level of Consciousness: awake and patient cooperative  Airway & Oxygen Therapy: Patient Spontanous Breathing  Post-op Assessment: Report given to RN and Post -op Vital signs reviewed and stable  Post vital signs: Reviewed and stable  Last Vitals:  Vitals Value Taken Time  BP 159/73 03/23/21 0847  Temp    Pulse 73 03/23/21 0849  Resp 14 03/23/21 0849  SpO2 100 % 03/23/21 0849  Vitals shown include unvalidated device data.  Last Pain:  Vitals:   03/23/21 0627  TempSrc: Temporal  PainSc: 7          Complications: No notable events documented.

## 2021-03-23 NOTE — Anesthesia Postprocedure Evaluation (Signed)
Anesthesia Post Note  Patient: ASTRI LINNE  Procedure(s) Performed: XI ROBOTIC ASSISTED LAPAROSCOPIC CHOLECYSTECTOMY INDOCYANINE GREEN FLUORESCENCE IMAGING (ICG)  Patient location during evaluation: PACU Anesthesia Type: General Level of consciousness: awake and alert and oriented Pain management: pain level controlled Vital Signs Assessment: post-procedure vital signs reviewed and stable Respiratory status: spontaneous breathing, nonlabored ventilation and respiratory function stable Cardiovascular status: blood pressure returned to baseline and stable Postop Assessment: no signs of nausea or vomiting Anesthetic complications: no   No notable events documented.   Last Vitals:  Vitals:   03/23/21 0924 03/23/21 0938  BP: 133/70 (!) 160/70  Pulse: 60 62  Resp: 12 14  Temp: (!) 36.2 C (!) 36.1 C  SpO2: 94% 96%    Last Pain:  Vitals:   03/23/21 0938  TempSrc: Temporal  PainSc:                  Zakaria Fromer

## 2021-03-23 NOTE — Op Note (Signed)
Robotic cholecystectomy with Indocyamine Green Ductal Imaging.   Pre-operative Diagnosis: Chronic calculus cholecystitis  Post-operative Diagnosis:  Same.  Procedure: Robotic assisted laparoscopic cholecystectomy with Indocyamine Green Ductal Imaging.   Surgeon: [name], M.D., FACS  Anesthesia: General. with endotracheal tube  Findings: no acute inflammatory changes, very fine CD, ICG in GB.   Estimated Blood Loss: 5 mL         Drains: None         Specimens: Gallbladder           Complications: none  Procedure Details  The patient was seen again in the Holding Room.  2.5 mg dose of ICG was administered intravenously.   The benefits, complications, treatment options, risks and expected outcomes were again reviewed with the patient. The likelihood of improving the patient's symptoms with return to their baseline status is good.  The patient and/or family concurred with the proposed plan, giving informed consent, again alternatives reviewed.  The patient was taken to Operating Room, identified, and the procedure verified as robotic assisted laparoscopic cholecystectomy.  Prior to the induction of general anesthesia, antibiotic prophylaxis was administered. VTE prophylaxis was in place. General endotracheal anesthesia was then administered and tolerated well. The patient was positioned in the supine position.  After the induction, the abdomen was prepped with Chloraprep and draped in the sterile fashion.  A Time Out was held and the above information confirmed.  After local infiltration of quarter percent Marcaine with epinephrine, stab incision was made left upper quadrant.  Just below the costal margin at Palmer's point, approximately midclavicular line the Veres needle is passed with sensation of the layers to penetrate the abdominal wall and into the peritoneum.  Saline drop test is confirmed peritoneal placement.  Insufflation is initiated with carbon dioxide to pressures of 15  mmHg.  Right infra-umbilical local infiltration with quarter percent Marcaine with epinephrine is utilized.  Made a 12 mm incision on the right periumbilical site, I advanced an optical 59m port under direct visualization into the peritoneal cavity.  Once the peritoneum was penetrated, insufflation was initiated.  The trocar was then advanced into the abdominal cavity under direct visualization. Pneumoperitoneum was then continued with Air seal utilizing CO2 at 15 mmHg or less and tolerated well without any adverse changes in the patient's vital signs.  Two 8.5-mm ports were placed in the left lower quadrant (avoiding mesh) and laterally, and one to the right lower quadrant, all under direct vision. All skin incisions  were infiltrated with a local anesthetic agent before making the incision and placing the trocars.  The patient was positioned  in reverse Trendelenburg, tilted the patient's left side down.  Da Vinci XI robot was then positioned on to the patient's left side, and docked.  The gallbladder was identified, the fundus grasped via the arm 4 Prograsp and retracted cephalad. Adhesions were lysed with scissors and cautery.  The infundibulum was identified grasped and retracted laterally, exposing the peritoneum overlying the triangle of Calot. This was then opened and dissected using cautery & scissors. An extended critical view of the cystic duct and cystic artery was obtained, aided by the ICG via FireFly which enabled ready visualization of the ductal anatomy.    The cystic duct was clearly identified and dissected to isolation.   Artery well isolated and clipped, and the cystic duct was triple clipped and divided with scissors, as close to the gallbladder neck as feasible, thus leaving two on the remaining stump.  The specimen side  of the artery is sealed with bipolar and divided with monopolar scissors.   The gallbladder was taken from the gallbladder fossa in a retrograde fashion with the  electrocautery. The gallbladder was removed and placed in an Endocatch bag.  The liver bed is inspected. Hemostasis was confirmed.  The robot was undocked and moved away from the operative field. No irrigation was utilized. The gallbladder and Endocatch sac were then removed through the infraumbilical port site.   Inspection of the right upper quadrant was performed. No bleeding, bile duct injury or leak, or bowel injury was noted. The infra-umbilical port site fascia was closed with interrumpted 0 Vicryl sutures using PMI/cone under direct visualization. Pneumoperitoneum was released and ports removed.  4-0 subcuticular Monocryl was used to close the skin. Dermabond was  applied.  The patient was then extubated and brought to the recovery room in stable condition. Sponge, lap, and needle counts were correct at closure and at the conclusion of the case.               [name], M.D., [facility] 03/23/2021 8:45 AM

## 2021-03-23 NOTE — Interval H&P Note (Signed)
History and Physical Interval Note:  03/23/2021 7:17 AM  Brittney Simpson  has presented today for surgery, with the diagnosis of biliary calculus.  The various methods of treatment have been discussed with the patient and family. After consideration of risks, benefits and other options for treatment, the patient has consented to  Procedure(s): XI ROBOTIC [place] (N/A) as a surgical intervention.  [name] history has been reviewed, patient examined, no change in status, stable for surgery.  I have reviewed the patient's chart and labs.  Questions were answered to the patient's satisfaction.     [name]

## 2021-03-23 NOTE — Anesthesia Preprocedure Evaluation (Signed)
Anesthesia Evaluation  Patient identified by MRN, date of birth, ID band Patient awake    Reviewed: Allergy & Precautions, NPO status , Patient's Chart, lab work & pertinent test results  History of Anesthesia Complications Negative for: history of anesthetic complications  Airway Mallampati: III  TM Distance: >3 FB Neck ROM: Full    Dental  (+) Upper Dentures, Lower Dentures   Pulmonary neg pulmonary ROS, neg sleep apnea, neg COPD,    breath sounds clear to auscultation- rhonchi (-) wheezing      Cardiovascular hypertension, Pt. on medications (-) CAD, (-) Past MI, (-) Cardiac Stents and (-) CABG  Rhythm:Regular Rate:Normal - Systolic murmurs and - Diastolic murmurs    Neuro/Psych neg Seizures Anxiety negative neurological ROS     GI/Hepatic Neg liver ROS, hiatal hernia, GERD  ,  Endo/Other  neg diabetesHypothyroidism   Renal/GU Renal InsufficiencyRenal disease     Musculoskeletal  (+) Arthritis ,   Abdominal (+) - obese,   Peds  Hematology negative hematology ROS (+)   Anesthesia Other Findings Past Medical History: No date: Anxiety No date: Arthritis No date: Back ache No date: Hiatal hernia No date: Hyperlipidemia No date: Hypertension No date: Hypothyroid No date: Osteopenia No date: Thyroid disease No date: UTI (urinary tract infection)   Reproductive/Obstetrics                             Anesthesia Physical Anesthesia Plan  ASA: 3  Anesthesia Plan: General   Post-op Pain Management:    Induction: Intravenous  PONV Risk Score and Plan: 2 and Ondansetron, Dexamethasone and Treatment may vary due to age or medical condition  Airway Management Planned: Oral ETT  Additional Equipment:   Intra-op Plan:   Post-operative Plan: Extubation in OR  Informed Consent: I have reviewed the patients History and Physical, chart, labs and discussed the procedure including the  risks, benefits and alternatives for the proposed anesthesia with the patient or authorized representative who has indicated his/her understanding and acceptance.     Dental advisory given  Plan Discussed with: CRNA and Anesthesiologist  Anesthesia Plan Comments:         Anesthesia Quick Evaluation

## 2021-03-26 LAB — SURGICAL PATHOLOGY

## 2021-03-28 ENCOUNTER — Encounter: Payer: Self-pay | Admitting: Family Medicine

## 2021-03-28 ENCOUNTER — Other Ambulatory Visit: Payer: Self-pay

## 2021-03-28 ENCOUNTER — Telehealth: Payer: Self-pay

## 2021-03-28 ENCOUNTER — Ambulatory Visit (INDEPENDENT_AMBULATORY_CARE_PROVIDER_SITE_OTHER): Payer: Medicare Other | Admitting: Family Medicine

## 2021-03-28 VITALS — BP 151/70 | HR 98 | Ht 64.0 in | Wt 120.4 lb

## 2021-03-28 DIAGNOSIS — E034 Atrophy of thyroid (acquired): Secondary | ICD-10-CM | POA: Diagnosis not present

## 2021-03-28 DIAGNOSIS — N183 Chronic kidney disease, stage 3 unspecified: Secondary | ICD-10-CM

## 2021-03-28 DIAGNOSIS — K219 Gastro-esophageal reflux disease without esophagitis: Secondary | ICD-10-CM

## 2021-03-28 DIAGNOSIS — T8141XA Infection following a procedure, superficial incisional surgical site, initial encounter: Secondary | ICD-10-CM

## 2021-03-28 DIAGNOSIS — I129 Hypertensive chronic kidney disease with stage 1 through stage 4 chronic kidney disease, or unspecified chronic kidney disease: Secondary | ICD-10-CM | POA: Diagnosis not present

## 2021-03-28 MED ORDER — OMEPRAZOLE 20 MG PO CPDR: 20.0000 mg | DELAYED_RELEASE_CAPSULE | Freq: Every day | ORAL | 3 refills | Status: DC

## 2021-03-28 MED ORDER — LEVOTHYROXINE SODIUM 112 MCG PO TABS: 112.0000 ug | ORAL_TABLET | Freq: Every day | ORAL | 3 refills | Status: DC

## 2021-03-28 MED ORDER — AMLODIPINE BESYLATE 5 MG PO TABS: 5.0000 mg | ORAL_TABLET | Freq: Every day | ORAL | 3 refills | Status: DC

## 2021-03-28 MED ORDER — AMOXICILLIN-POT CLAVULANATE 875-125 MG PO TABS: 1.0000 | ORAL_TABLET | Freq: Two times a day (BID) | ORAL | 0 refills | Status: DC

## 2021-03-28 NOTE — Progress Notes (Signed)
Subjective:    Patient ID: Brittney Simpson, female    DOB: [birth year], 87 y.o.   MRN: [id number]  Brittney Simpson is a 87 y.o. female presenting on 03/28/2021 for Abdominal Pain, Hypertension, and Hypothyroidism   HPI  Post-op Cholecystectomy Lap Recent surgery 5 days ago 03/23/21 robotic lap choley done by Dr Christian Mate [facility] at [facility]. Today she reports doing well but has 1 of the incision sites R side of abdomen with slight redness and discomfort. No oozing or draining. No fever. She has had amoxicillin in past for UTI symptoms before on standing order, needs new order today and asks if this would cover this as well.  GERD On Omeprazole '20mg'$  BID now, advised to reduce to  Has come off Sucralfate  Chronic Back pain On tramadol by Pain Management.  Needs med refills Levothyroxine, hypothyroidism.    Depression screen [facility] 2/9 03/13/2021 03/07/2021 02/06/2021  Decreased Interest 0 0 0  Down, Depressed, Hopeless 0 0 0  PHQ - 2 Score 0 0 0  Altered sleeping - 0 -  Tired, decreased energy - 1 -  Change in appetite - 1 -  Feeling bad or failure about yourself  - 0 -  Trouble concentrating - 0 -  Moving slowly or fidgety/restless - 0 -  Suicidal thoughts - 0 -  PHQ-9 Score - 2 -  Difficult doing work/chores - Not difficult at all -  Some recent data might be hidden    Social History   Tobacco Use   Smoking status: Never   Smokeless tobacco: Never  Vaping Use   Vaping Use: Never used  Substance Use Topics   Alcohol use: No    Alcohol/week: 0.0 standard drinks   Drug use: No    Review of Systems Per HPI unless specifically indicated above     Objective:    BP (!) 151/70   Pulse 98   Ht '5\' 4"'$  (1.626 m)   Wt 120 lb 6.4 oz (54.6 kg)   BMI 20.67 kg/m   Wt Readings from Last 3 Encounters:  03/28/21 120 lb 6.4 oz (54.6 kg)  03/22/21 119 lb 0.8 oz (54 kg)  03/20/21 119 lb (54 kg)    Physical Exam Vitals and nursing note reviewed.  Constitutional:       General: She is not in acute distress.    Appearance: She is well-developed. She is not diaphoretic.     Comments: Well-appearing, comfortable, cooperative  HENT:     Head: Normocephalic and atraumatic.  Eyes:     General:        Right eye: No discharge.        Left eye: No discharge.     Conjunctiva/sclera: Conjunctivae normal.  Neck:     Thyroid: No thyromegaly.  Cardiovascular:     Rate and Rhythm: Normal rate and regular rhythm.     Heart sounds: Normal heart sounds. No murmur heard. Pulmonary:     Effort: Pulmonary effort is normal. No respiratory distress.     Breath sounds: Normal breath sounds. No wheezing or rales.  Abdominal:     Tenderness: There is no abdominal tenderness.     Comments: R sided abdomen incision from recent robotic surgery very mild erythematous superficial appearance, minimal tender. No oozing or drainage.  Musculoskeletal:        General: Normal range of motion.     Cervical back: Normal range of motion and neck supple.  Lymphadenopathy:  Cervical: No cervical adenopathy.  Skin:    General: Skin is warm and dry.     Findings: No erythema or rash.  Neurological:     Mental Status: She is alert and oriented to person, place, and time.  Psychiatric:        Behavior: Behavior normal.     Comments: Well groomed, good eye contact, normal speech and thoughts   Results for orders placed or performed during the hospital encounter of 03/23/21  Surgical pathology  Result Value Ref Range   SURGICAL PATHOLOGY      SURGICAL PATHOLOGY CASE: [id number] PATIENT: Lynnell Grain Surgical Pathology Report     Specimen Submitted: A. Gallbladder  Clinical History: Biliary calculus      DIAGNOSIS: A. GALLBLADDER, CHOLECYSTECTOMY: - CHRONIC CHOLECYSTITIS WITH CHOLELITHIASIS. - NEGATIVE FOR MALIGNANCY.   GROSS DESCRIPTION: A. Labeled: Gallbladder Received: Formalin Collection time: 8:30 AM on 03/23/2021 Placed into formalin time: 8:35 AM  on 03/23/2021 Size of specimen: 9.5 x 3.2 x 2.2 cm Specimen integrity: Intact External surface: The serosa is green-pink, smooth, and glistening with a roughened hepatic bed. Wall thickness: Range from 0.1-0.3 cm Mucosa: The mucosa is tan-green and velvety.  Cholesterolosis is absent. Cystic duct: The cystic duct is 1.7 x 0.7 x 0.5 cm.  The duct is patent and grossly unremarkable. Bile present: There is green-tan viscous bile. Stones present: There are multiple black, firm, lobulated stones and stone fragments, 1.8 x 1.7 x 0.7 cm in  aggregate. Other findings: None grossly identified.  Block summary: 1 - cystic duct resection margin, en face and inked blue, with representative wall  RB 03/23/2021  Final Diagnosis performed by Betsy Pries, MD.   Electronically signed 03/26/2021 9:36:31AM The electronic signature indicates that the named Attending Pathologist has evaluated the specimen Technical component performed at [facility], [address], [place], [place] [address] Lab: [phone] Dir: Rush Farmer, MD, MMM  Professional component performed at [facility], [facility], [place], [place], [place] [address] Lab: [phone] Dir: Dellia Nims. Rubinas, MD       Assessment & Plan:   Problem List Items Addressed This Visit     Hypothyroidism   Relevant Medications   levothyroxine (SYNTHROID) 112 MCG tablet   GERD (gastroesophageal reflux disease)   Relevant Medications   omeprazole (PRILOSEC) 20 MG capsule   Benign hypertension with CKD (chronic kidney disease) stage III (HCC) - Primary   Relevant Medications   amLODipine (NORVASC) 5 MG tablet   Other Visit Diagnoses     Superficial incisional infection of surgical site       Relevant Medications   amoxicillin-clavulanate (AUGMENTIN) 875-125 MG tablet       Med refills as above  Dose reduce Omeprazole from 20 BID down to '20mg'$  daily  DC Sucralfate  Advised for post-op follow-up with Gen  Surgery keep apt 04/03/21 next week Advised that the site on R side of abdomen does not necessarily appear to be infected but she usually takes Amoxicillin PRN for potential UTI and they would feel better if she had an order of Amoxicillin as a precaution. I agreed and ordered Augmentin course.   Meds ordered this encounter  Medications   amLODipine (NORVASC) 5 MG tablet    Sig: Take 1 tablet (5 mg total) by mouth daily with lunch.    Dispense:  90 tablet    Refill:  3    Add refills   levothyroxine (SYNTHROID) 112 MCG tablet    Sig: Take 1 tablet (  112 mcg total) by mouth daily before breakfast.    Dispense:  90 tablet    Refill:  3   omeprazole (PRILOSEC) 20 MG capsule    Sig: Take 1 capsule (20 mg total) by mouth daily before breakfast.    Dispense:  90 capsule    Refill:  3   amoxicillin-clavulanate (AUGMENTIN) 875-125 MG tablet    Sig: Take 1 tablet by mouth 2 (two) times daily.    Dispense:  14 tablet    Refill:  0      Follow up plan: Return if symptoms worsen or fail to improve.  Nobie Putnam, [facility] 03/28/2021, 2:57 PM

## 2021-03-28 NOTE — Patient Instructions (Addendum)
Thank you for coming to the office today.  Upcoming post-op apt on 04/03/21 as scheduled to look at incision sites and follow-up.   Please schedule a Follow-up Appointment to: Return if symptoms worsen or fail to improve.  If you have any other questions or concerns, please feel free to call the office or send a message through [place]. You may also schedule an earlier appointment if necessary.  Additionally, you may be receiving a survey about your experience at our office within a few days to 1 week by e-mail or mail. We value your feedback.  Nobie Putnam, DO [place]

## 2021-03-28 NOTE — Telephone Encounter (Signed)
   Telephone encounter was:  Successful.  03/28/2021 Name: Brittney Simpson MRN: [id number] DOB: Dec 21, 1927  Brittney Simpson is a 87 y.o. year old female who is a primary care patient of Olin Hauser, DO . The community resource team was consulted for assistance with  hearing aids.  Care guide performed the following interventions: Spoke with patient's daughter [name] about the [facility].  Confirmed email address [email or web address] sent information. Letter saved in Epic.  Follow Up Plan:  Care guide will follow up with patient by phone over the next 7-10 days.  [name], AAS Paralegal, [name]  [address], [place] [address] [name]  ?? [phone]   [email or web address]

## 2021-03-30 ENCOUNTER — Other Ambulatory Visit: Payer: Self-pay | Admitting: Pain Medicine

## 2021-03-30 DIAGNOSIS — Z79899 Other long term (current) drug therapy: Secondary | ICD-10-CM

## 2021-03-30 DIAGNOSIS — M79604 Pain in right leg: Secondary | ICD-10-CM

## 2021-03-30 DIAGNOSIS — Z79891 Long term (current) use of opiate analgesic: Secondary | ICD-10-CM

## 2021-03-30 DIAGNOSIS — G894 Chronic pain syndrome: Secondary | ICD-10-CM

## 2021-03-30 DIAGNOSIS — M5137 Other intervertebral disc degeneration, lumbosacral region: Secondary | ICD-10-CM

## 2021-03-30 DIAGNOSIS — G8929 Other chronic pain: Secondary | ICD-10-CM

## 2021-03-30 DIAGNOSIS — M47816 Spondylosis without myelopathy or radiculopathy, lumbar region: Secondary | ICD-10-CM

## 2021-04-03 ENCOUNTER — Encounter: Payer: Self-pay | Admitting: Surgery

## 2021-04-03 ENCOUNTER — Encounter: Payer: Medicare Other | Admitting: Surgery

## 2021-04-03 ENCOUNTER — Ambulatory Visit (INDEPENDENT_AMBULATORY_CARE_PROVIDER_SITE_OTHER): Payer: Medicare Other | Admitting: Surgery

## 2021-04-03 ENCOUNTER — Other Ambulatory Visit: Payer: Self-pay

## 2021-04-03 VITALS — BP 156/73 | HR 74 | Temp 98.3°F | Ht 64.0 in | Wt 120.0 lb

## 2021-04-03 DIAGNOSIS — Z9049 Acquired absence of other specified parts of digestive tract: Secondary | ICD-10-CM

## 2021-04-03 NOTE — Progress Notes (Signed)
[facility] POST-OP OFFICE VISIT  04/03/2021  HPI: Brittney Simpson is a 87 y.o. female 11 days s/p robotic cholecystectomy for chronic calculus cholecystitis.  Only c/o a bit of stinging in lateral most incisions.  O/w doing well.   Vital signs: There were no vitals taken for this visit.   Physical Exam: Constitutional: looks great for 89.  Abdomen: Soft and nt.  Skin: incisions intact and w/o ecchymosis.   Assessment/Plan: This is a 87 y.o. female 11 days s/p robotic chole.   Patient Active Problem List   Diagnosis Date Noted   Chronic use of opiate for therapeutic purpose 12/31/2020   Benign hypertension with CKD (chronic kidney disease) stage III ([place]) 05/04/2020   DDD (degenerative disc disease), thoracolumbar 03/23/2020   Spasm of back muscles 03/21/2020   Somatic dysfunction of sacroiliac joints (Bilateral) 02/10/2020   Other spondylosis, sacral and sacrococcygeal region 02/10/2020   Hyponatremia 07/28/2019   Edema of lower extremity 07/28/2019   DDD (degenerative disc disease), lumbosacral 05/18/2019   Chronic pain syndrome 05/05/2019   Pharmacologic therapy 05/05/2019   T12 compression fracture, sequela 08/11/2018   Lumbar facet hypertrophy 08/11/2018   Lumbar lateral recess stenosis 08/11/2018   Lumbar foraminal stenosis 08/11/2018   Non-traumatic compression fracture of T7 thoracic vertebra, sequela 08/11/2018   Abnormal MRI, lumbar spine (01/10/2017) 05/05/2018   Ankle edema, bilateral 05/02/2017   AAA (abdominal aortic aneurysm) without rupture ([place]) 02/11/2017   Osteoporosis 01/08/2017   Bilateral hearing loss 11/19/2016   Hyperlipidemia 11/18/2016   Abnormal glucose 11/18/2016   Recurrent urinary tract infection 10/25/2016   GERD (gastroesophageal reflux disease) 10/25/2016   Presbycusis of both ears 10/25/2016   Spondylosis without myelopathy or radiculopathy, lumbosacral region 05/14/2016   Hypothyroidism 04/23/2016   Stage 3 chronic  kidney disease ([place]) 10/11/2015   Skin lesion of scalp 10/11/2015   Chronic anxiety 10/11/2015   Insomnia 10/11/2015   Hypertension 10/11/2015   Spondylolisthesis of lumbosacral region (L2-3 and L5-S1) 08/22/2015   Lumbar spinal stenosis (9 mm at L3-4) 08/22/2015   Trochanteric bursitis (Right) 08/22/2015   Chronic hip pain (2ry area of Pain) (Right) 08/22/2015   Osteoarthritis of hip (Right) 08/22/2015   Chronic sacroiliac joint pain (Bilateral) (R>L) 08/22/2015   Chronic lumbar radicular pain (Right) 08/22/2015   Chronic lower extremity pain (3ry area of Pain) (Right) 08/22/2015   Myofascial pain 08/22/2015   Encounter for chronic pain management 08/22/2015   Chronic low back pain (1ry area of Pain) (Bilateral) (R>L) w/o sciatica 06/03/2015   Long term current use of opiate analgesic 05/24/2015   Long term prescription opiate use 05/24/2015   Opiate use (15 MME/Day) 05/24/2015   Encounter for therapeutic drug level monitoring 05/24/2015   Lumbar facet syndrome (Bilateral) (R>L) 05/24/2015    - f/u with [place] as needed.     [name] M.D., FACS 04/03/2021, 1:34 PM

## 2021-04-03 NOTE — Patient Instructions (Addendum)
The glue will come off in 1-2 more weeks. If you still have some discomfort you may try using a heating pad instead of ice. This may help more. The pain should improve over the next few weeks.   Follow-up with our office as needed.  Please call and ask to speak with a nurse if you develop questions or concerns.   GENERAL POST-OPERATIVE PATIENT INSTRUCTIONS   WOUND CARE INSTRUCTIONS: If the wound becomes bright red and painful or starts to drain infected material that is not clear, please contact your physician immediately.  If the wound is mildly pink and has a thick firm ridge underneath it, this is normal, and is referred to as a healing ridge.  This will resolve over the next 4-6 weeks.  BATHING: You may shower if you have been informed of this by your surgeon. However, Please do not submerge in a tub, hot tub, or pool until incisions are completely sealed or have been told by your surgeon that you may do so.  DIET:  You may eat any foods that you can tolerate.  It is a good idea to eat a high fiber diet and take in plenty of fluids to prevent constipation.  If you do become constipated you may want to take a mild laxative or take ducolax tablets on a daily basis until your bowel habits are regular.  Constipation can be very uncomfortable, along with straining, after recent surgery.  ACTIVITY: You may want to hug a pillow when coughing and sneezing to add additional support to the surgical area, if you had abdominal or chest surgery, which will decrease pain during these times.  You are encouraged to walk and engage in light activity for the next two weeks.  You should not lift more than 20 pounds for 6 weeks after surgery as it could put you at increased risk for complications.  Twenty pounds is roughly equivalent to a plastic bag of groceries. At that time- Listen to your body when lifting, if you have pain when lifting, stop and then try again in a few days. Soreness after doing exercises or  activities of daily living is normal as you get back in to your normal routine.  MEDICATIONS:  Try to take narcotic medications and anti-inflammatory medications, such as tylenol, ibuprofen, naprosyn, etc., with food.  This will minimize stomach upset from the medication.  Should you develop nausea and vomiting from the pain medication, or develop a rash, please discontinue the medication and contact your physician.  You should not drive, make important decisions, or operate machinery when taking narcotic pain medication.  SUNBLOCK Use sun block to incision area over the next year if this area will be exposed to sun. This helps decrease scarring and will allow you avoid a permanent darkened area over your incision.  QUESTIONS:  Please feel free to call our office if you have any questions, and we will be glad to assist you.

## 2021-04-09 ENCOUNTER — Telehealth: Payer: Self-pay

## 2021-04-09 NOTE — Telephone Encounter (Signed)
Pt daughter is asking a nurse to give her a call back pt Is wanting a procedure but pt had gall bladder removed 03/23/21 and wants to know does this affect future procedures

## 2021-04-09 NOTE — Telephone Encounter (Signed)
Patient's daughter called and is asking about procedure for low back pain and right hip pain, would like to have a procedure.  Has been released by surgeron from cholycystectomy procedure.  Explained that I would communicate with Dr Lowella Dandy and let her know if we can schedule or if she will need a visit prior.

## 2021-04-10 ENCOUNTER — Telehealth: Payer: Self-pay

## 2021-04-10 ENCOUNTER — Telehealth: Payer: Self-pay | Admitting: Pain Medicine

## 2021-04-10 NOTE — Telephone Encounter (Signed)
   Telephone encounter was:  Successful.  04/10/2021 Name: Brittney Simpson MRN: [id number] DOB: Jul 12, 1928  Brittney Simpson is a 86 y.o. year old female who is a primary care patient of Olin Hauser, DO . The community resource team was consulted for assistance with  hearing aids.  Care guide performed the following interventions: Spoke with patient's daughter [name] she has received emailed information for the [employer] [facility].  She has not had time to contact them but plans on doing so as soon as time permits.   Follow Up Plan:  No further follow up planned at this time. The patient has been provided with needed resources.  Emmerich Cryer, AAS Paralegal, [facility]  [address] [place], [place] [address] ??[email or web address]  ?? [phone]   [email or web address]

## 2021-04-11 NOTE — Telephone Encounter (Signed)
Spoke with Dr. Dossie Arbour, will need to have an appt first.Patient's daughter Arbie Cookey notified.

## 2021-04-14 NOTE — Progress Notes (Signed)
PROVIDER NOTE: Information contained herein reflects review and annotations entered in association with encounter. Interpretation of such information and data should be left to medically-trained personnel. Information provided to patient can be located elsewhere in the medical record under "Patient Instructions". Document created using STT-dictation technology, any transcriptional errors that may result from process are unintentional.    Patient: Brittney Simpson  Service Category: E/M  Provider: Gaspar Cola, MD  DOB: [birth year]  DOS: 04/18/2021  Specialty: Interventional Pain Management  MRN: [id number]  Setting: Ambulatory outpatient  PCP: Olin Hauser, DO  Type: Established Patient    Referring Provider: Nobie Putnam *  Location: Office  Delivery: Face-to-face     HPI  [name], a 87 y.o. year old female, is here today because of her Chronic bilateral low back pain without sciatica [M54.50, G89.29]. Ms. Pfannenstiel primary complain today is Back Pain (low) Last encounter: My last encounter with her was on 04/10/2021. Pertinent problems: Brittney Simpson has Lumbar facet syndrome (Bilateral) (R>L); Chronic low back pain (1ry area of Pain) (Bilateral) (R>L) w/o sciatica; Spondylolisthesis of lumbosacral region (L2-3 and L5-S1); Lumbar spinal stenosis (9 mm at L3-4); Trochanteric bursitis (Right); Chronic hip pain (2ry area of Pain) (Right); Osteoarthritis of hip (Right); Chronic sacroiliac joint pain (Bilateral) (R>L); Chronic lumbar radicular pain (Right); Chronic lower extremity pain (3ry area of Pain) (Right); Myofascial pain; Spondylosis without myelopathy or radiculopathy, lumbosacral region; Ankle edema, bilateral; Abnormal MRI, lumbar spine (01/10/2017); T12 compression fracture, sequela; Lumbar facet hypertrophy; Lumbar lateral recess stenosis; Lumbar foraminal stenosis; Non-traumatic compression fracture of T7 thoracic vertebra, sequela; Chronic pain syndrome;  DDD (degenerative disc disease), lumbosacral; Edema of lower extremity; Somatic dysfunction of sacroiliac joints (Bilateral); Other spondylosis, sacral and sacrococcygeal region; Spasm of back muscles; and DDD (degenerative disc disease), thoracolumbar on their pertinent problem list. Pain Assessment: Severity of Chronic pain is reported as a 9 /10. Location: Back Lower/denies. Onset: More than a month ago. Quality: Discomfort. Timing: Constant. Modifying factor(s): nothing. Vitals:  height is '5\' 4"'  (1.626 m) and weight is 120 lb (54.4 kg). Her temporal temperature is 97.8 F (36.6 C). Her blood pressure is 144/64 (abnormal) and her pulse is 59 (abnormal). Her respiration is 16 and oxygen saturation is 96%.   Reason for encounter: medication management.   The patient indicates doing well with the current medication regimen. No adverse reactions or side effects reported to the medications.   On 03/23/2021 the patient had a laparoscopic cholecystectomy, 26 days ago.  She did well with the surgery, but she has been having some recurrence of her low back pain.  Typically she responds well to a right-sided T12-L1 LESI.  The last time we had 1 done was on 02/06/2021.  They are interested in having it repeated, if at all possible.  After having spoken to the patient's daughter, she indicated that on the last visit to the surgeon, they released her from their care and she took 2/day to to ask him if there were any contraindications on the part to doing any kind of pain injections on her.  She stated that she was told that there were none.  In view of this, I am also okay with proceeding with the epidural.  The plan was shared with the patient and her daughter who understood and accepted.  RTCB: 10/21/2021 Nonopioids transferred 04/11/2020: Robaxin  Pharmacotherapy Assessment  Analgesic: Tramadol 50 mg, 1 tab PO q 8 hrs (150 mg/day of tramadol) MME/day: 15 mg/day.  Monitoring: [place] PMP: PDMP reviewed during this  encounter.       Pharmacotherapy: No side-effects or adverse reactions reported. Compliance: No problems identified. Effectiveness: Clinically acceptable.  Dewayne Shorter, RN  04/18/2021  2:29 PM  Sign when Signing Visit Nursing Pain Medication Assessment:  Safety precautions to be maintained throughout the outpatient stay will include: orient to surroundings, keep bed in low position, maintain call bell within reach at all times, provide assistance with transfer out of bed and ambulation.  Medication Inspection Compliance: Pill count conducted under aseptic conditions, in front of the patient. Neither the pills nor the bottle was removed from the patient's sight at any time. Once count was completed pills were immediately returned to the patient in their original bottle.  Medication: Tramadol (Ultram) Pill/Patch Count:  29 of 90 pills remain Pill/Patch Appearance: Markings consistent with prescribed medication Bottle Appearance: Standard pharmacy container. Clearly labeled. Filled Date: 06 / 29 / 2022 Last Medication intake:  Today    UDS:  Summary  Date Value Ref Range Status  01/24/2021 Note  Final    Comment:    ==================================================================== [employer] Select 13 (MW) ==================================================================== Specimen Alert Note: Urinary creatinine is low; ability to detect some drugs may be compromised. Interpret results with caution. (Creatinine) ==================================================================== Test                             Result       Flag       Units  Drug Present and Declared for Prescription Verification   7-aminoclonazepam              287          EXPECTED   ng/mg creat    7-aminoclonazepam is an expected metabolite of clonazepam. Source of    clonazepam is a scheduled prescription medication.    Tramadol                       >33333       EXPECTED   ng/mg creat   O-Desmethyltramadol             21013        EXPECTED   ng/mg creat   N-Desmethyltramadol            7287         EXPECTED   ng/mg creat    Source of tramadol is a prescription medication. O-desmethyltramadol    and N-desmethyltramadol are expected metabolites of tramadol.  ==================================================================== Test                      Result    Flag   Units      Ref Range   Creatinine              15        LL     mg/dL      >=20 ==================================================================== Declared Medications:  The flagging and interpretation on this report are based on the  following declared medications.  Unexpected results may arise from  inaccuracies in the declared medications.   **Note: The testing scope of this panel includes these medications:   Clonazepam (Klonopin)  Tramadol (Ultram)   **Note: The testing scope of this panel does not include the  following reported medications:   Acetaminophen (Tylenol)  Amlodipine  Cranberry  Furosemide  Latanoprost (Xalatan)  Levothyroxine  Lisinopril  Melatonin  Metoprolol  Omeprazole  Sulfamethoxazole (Bactrim)  Tamsulosin (Flomax)  Trazodone  Trimethoprim (Bactrim)  Vitamin D2 ==================================================================== For clinical consultation, please call [phone]. ====================================================================      ROS  Constitutional: Denies any fever or chills Gastrointestinal: No reported hemesis, hematochezia, vomiting, or acute GI distress Musculoskeletal: Denies any acute onset joint swelling, redness, loss of ROM, or weakness Neurological: No reported episodes of acute onset apraxia, aphasia, dysarthria, agnosia, amnesia, paralysis, loss of coordination, or loss of consciousness  Medication Review  Cranberry, Melatonin, Vitamin D3, acetaminophen, amLODipine, amoxicillin-clavulanate, clonazePAM, furosemide, ibuprofen, latanoprost,  levothyroxine, lisinopril, metoprolol succinate, omeprazole, simethicone, sulfamethoxazole-trimethoprim, tamsulosin, traMADol, and traZODone  History Review  Allergy: Ms. Schelling has No Known Allergies. Drug: Ms. Salyers  reports no history of drug use. Alcohol:  reports no history of alcohol use. Tobacco:  reports that she has never smoked. She has never used smokeless tobacco. Social: Ms. Demicco  reports that she has never smoked. She has never used smokeless tobacco. She reports that she does not drink alcohol and does not use drugs. Medical:  has a past medical history of Anxiety, Arthritis, Back ache, Hiatal hernia, Hyperlipidemia, Hypertension, Hypothyroid, Osteopenia, Thyroid disease, and UTI (urinary tract infection). Surgical: Ms. Wickstrom  has a past surgical history that includes Hernia repair; Hernia repair; Hemorrhoid surgery; Kyphoplasty (N/A, 01/23/2017); Kyphoplasty (N/A, 01/25/2020); and Cholecystectomy. Family: family history includes Cancer in her father; Stroke in her mother.  Laboratory Chemistry Profile   Renal Lab Results  Component Value Date   BUN 8 03/07/2021   CREATININE 0.75 87/68/1157   BCR NOT APPLICABLE 26/20/3559   GFRAA 76 05/16/2020   GFRNONAA 66 05/16/2020    Hepatic Lab Results  Component Value Date   AST 10 03/07/2021   ALT 7 03/07/2021   ALBUMIN 3.6 11/22/2016   ALKPHOS 59 11/22/2016   LIPASE 21 06/14/2016    Electrolytes Lab Results  Component Value Date   NA 135 03/07/2021   K 3.9 03/07/2021   CL 95 (L) 03/07/2021   CALCIUM 9.6 03/07/2021   MG 2.1 01/14/2014    Bone No results found for: VD25OH, VD125OH2TOT, RC1638GT3, MI6803OZ2, 25OHVITD1, 25OHVITD2, 25OHVITD3, TESTOFREE, TESTOSTERONE  Inflammation (CRP: Acute Phase) (ESR: Chronic Phase) No results found for: CRP, ESRSEDRATE, LATICACIDVEN       Note: Above Lab results reviewed.  Recent Imaging Review  CT Abdomen Pelvis W Contrast CLINICAL DATA:  Right upper quadrant pain  radiating to right flank, nausea  EXAM: CT ABDOMEN AND PELVIS WITH CONTRAST  TECHNIQUE: Multidetector CT imaging of the abdomen and pelvis was performed using the standard protocol following bolus administration of intravenous contrast.  CONTRAST:  27m OMNIPAQUE IOHEXOL 350 MG/ML SOLN  COMPARISON:  03/13/2021, 08/19/2018  FINDINGS: Lower chest: No acute pleural or parenchymal lung disease. Mild cardiomegaly. Stable ectasia of the thoracic aorta. Small hiatal hernia.  Hepatobiliary: Calcified gallstone again identified. Gallbladder is decompressed with no evidence of acute cholecystitis. Small cysts are again noted within the right lobe liver. No biliary duct dilation.  Pancreas: Unremarkable. No pancreatic ductal dilatation or surrounding inflammatory changes.  Spleen: Normal in size without focal abnormality.  Adrenals/Urinary Tract: Mild bilateral renal cortical atrophy. Stable bilateral extrarenal pelves, left larger than right. No urinary tract calculi or obstructive uropathy. The adrenals and bladder are unremarkable.  Stomach/Bowel: No bowel obstruction or ileus. Normal appendix central lower abdomen. No bowel wall thickening or inflammatory change.  Vascular/Lymphatic: There is marked ectasia of the thoracic aorta. At the level of the right renal artery, abdominal  aortic aneurysm is again identified measuring up to 4.4 cm, previously measuring up to 3.8 cm. There is an additional infrarenal abdominal aortic aneurysm just proximal to the bifurcation, measuring up to 3.4 cm, previously having measured 2.9 cm. Normal caliber of the bilateral iliac arteries with extensive atherosclerosis again identified.  No pathologic adenopathy within the abdomen or pelvis.  Reproductive: Uterus and bilateral adnexa are unremarkable.  Other: No free fluid or free gas.  No abdominal wall hernia.  Musculoskeletal: Bones are diffusely osteopenic. No acute fractures. Chronic  compression deformities and vertebral augmentation at T12 and L4. Stable grade 2 anterolisthesis of L5 on S1 with bilateral L5 spondylolysis. Reconstructed images demonstrate no additional findings.  IMPRESSION: 1. Cholelithiasis without cholecystitis. 2. Hiatal hernia. 3. Abdominal aortic aneurysm, measuring up to 4.4 cm at the origin of the right renal artery and 3.4 cm just proximal to the aortic bifurcation. Recommend follow-up every 12 months and vascular consultation. This recommendation follows ACR consensus guidelines: White Paper of the ACR Incidental Findings Committee II on Vascular Findings. J Am Coll Radiol 2013; 44:010-272. 4.  Aortic Atherosclerosis (ICD10-I70.0).  Electronically Signed   By: Randa Ngo M.D.   On: 03/20/2021 17:35 Note: Reviewed        Physical Exam  General appearance: Well nourished, well developed, and well hydrated. In no apparent acute distress Mental status: Alert, oriented x 3 (person, place, & time)       Respiratory: No evidence of acute respiratory distress Eyes: PERLA Vitals: BP (!) 144/64   Pulse (!) 59   Temp 97.8 F (36.6 C) (Temporal)   Resp 16   Ht '5\' 4"'  (1.626 m)   Wt 120 lb (54.4 kg)   SpO2 96%   BMI 20.60 kg/m  BMI: Estimated body mass index is 20.6 kg/m as calculated from the following:   Height as of this encounter: '5\' 4"'  (1.626 m).   Weight as of this encounter: 120 lb (54.4 kg). Ideal: Ideal body weight: 54.7 kg (120 lb 9.5 oz)  Assessment   Status Diagnosis  Controlled Controlled Controlled 1. Chronic low back pain (1ry area of Pain) (Bilateral) (R>L) w/o sciatica   2. Chronic hip pain (2ry area of Pain) (Right)   3. Chronic lower extremity pain (3ry area of Pain) (Right)   4. DDD (degenerative disc disease), lumbosacral   5. Chronic sacroiliac joint pain (Bilateral) (R>L)   6. Lumbar facet syndrome (Bilateral) (R>L)   7. Non-traumatic compression fracture of T7 thoracic vertebra, sequela   8. T12  compression fracture, sequela   9. DDD (degenerative disc disease), thoracolumbar   10. Chronic pain syndrome   11. Pharmacologic therapy   12. Chronic use of opiate for therapeutic purpose   13. Encounter for chronic pain management   14. Encounter for medication management      Updated Problems: No problems updated.  Plan of Care  Problem-specific:  No problem-specific Assessment & Plan notes found for this encounter.  Ms. KATOYA AMATO has a current medication list which includes the following long-term medication(s): amlodipine, clonazepam, furosemide, levothyroxine, lisinopril, metoprolol succinate, omeprazole, trazodone, and [START ON 04/24/2021] tramadol.  Pharmacotherapy (Medications Ordered): Meds ordered this encounter  Medications   traMADol (ULTRAM) 50 MG tablet    Sig: Take 1 tablet (50 mg total) by mouth 3 (three) times daily. Each refill must last 30 days.    Dispense:  90 tablet    Refill:  5    Not a duplicate. Do NOT delete! Dispense  1 day early if closed on fill date. Warn: no CNS-depressants within 8 hrs of opioid. Do not send refill request. Renewal requires appointment. No partial fills allowed   Orders:  Orders Placed This Encounter  Procedures   Lumbar Epidural Injection    Standing Status:   Future    Standing Expiration Date:   07/18/2021    Scheduling Instructions:     Procedure: Interlaminar Lumbar Epidural Steroid injection (LESI)  T12-L1     Laterality: Right-sided     Sedation: None required.     Timeframe: ASAA    Order Specific Question:   Where will this procedure be performed?    Answer:   [facility]   Follow-up plan:   Return for (Clinic) procedure: (R) T12-L1 LESI , (NS).     Interventional Therapies  Risk  Complexity Considerations:   Estimated body mass index is 20.19 kg/m as calculated from the following:   Height as of this encounter: '5\' 3"'  (1.6 m).   Weight as of this encounter: 114 lb (51.7 kg). Note: Hard of  hearing   Planned  Pending:   Pending further evaluation   Under consideration:   Possible bilateral lumbar facet RFA. (Offerred several times. Pt. chooses to hold.) Diagnostic right hip joint injection  Diagnostic right L3 TFESI  Diagnostic right trochanteric bursa injection  Diagnostic right SI joint block  Diagnostic midline caudal ESI (for the tailbone pain)   Completed:   Palliative right T12-L1 LESI x3 (11/28/2020)  Therapeutic right L2-3 LESI x1 (03/07/2020)  Diagnostic/therapeutic bilateral SI joint injection x1 (02/10/2020)  Palliative right lumbar facet block x13 (01/04/2020)  Palliative left lumbar facet block x11 (01/04/2020)    Therapeutic  Palliative (PRN) options:   Palliative left T12-L1 LESI #4  Palliative right lumbar facet block #14  Palliative left lumbar facet block #12       Recent Visits Date Type Provider Dept  02/20/21 Telemedicine Milinda Pointer, MD [facility]  02/06/21 Procedure visit Milinda Pointer, MD [facility]  01/24/21 Office Visit Milinda Pointer, MD [facility]  Showing recent visits within past 90 days and meeting all other requirements Today's Visits Date Type Provider Dept  04/18/21 Office Visit Milinda Pointer, MD [facility]  Showing today's visits and meeting all other requirements Future Appointments No visits were found meeting these conditions. Showing future appointments within next 90 days and meeting all other requirements I discussed the assessment and treatment plan with the patient. The patient was provided an opportunity to ask questions and all were answered. The patient agreed with the plan and demonstrated an understanding of the instructions.  Patient advised to call back or seek an in-person evaluation if the symptoms or condition worsens.  Duration of encounter: 30 minutes.  Note by: Gaspar Cola, MD Date: 04/18/2021; Time: 2:44 PM

## 2021-04-18 ENCOUNTER — Other Ambulatory Visit: Payer: Self-pay

## 2021-04-18 ENCOUNTER — Encounter: Payer: Self-pay | Admitting: Pain Medicine

## 2021-04-18 ENCOUNTER — Ambulatory Visit: Payer: Medicare Other | Attending: Pain Medicine | Admitting: Pain Medicine

## 2021-04-18 VITALS — BP 144/64 | HR 59 | Temp 97.8°F | Resp 16 | Ht 64.0 in | Wt 120.0 lb

## 2021-04-18 DIAGNOSIS — M5137 Other intervertebral disc degeneration, lumbosacral region: Secondary | ICD-10-CM | POA: Diagnosis not present

## 2021-04-18 DIAGNOSIS — M25551 Pain in right hip: Secondary | ICD-10-CM | POA: Insufficient documentation

## 2021-04-18 DIAGNOSIS — M545 Low back pain, unspecified: Secondary | ICD-10-CM | POA: Diagnosis not present

## 2021-04-18 DIAGNOSIS — Z79891 Long term (current) use of opiate analgesic: Secondary | ICD-10-CM | POA: Insufficient documentation

## 2021-04-18 DIAGNOSIS — M4854XS Collapsed vertebra, not elsewhere classified, thoracic region, sequela of fracture: Secondary | ICD-10-CM | POA: Diagnosis not present

## 2021-04-18 DIAGNOSIS — Z79899 Other long term (current) drug therapy: Secondary | ICD-10-CM | POA: Diagnosis not present

## 2021-04-18 DIAGNOSIS — S22080S Wedge compression fracture of T11-T12 vertebra, sequela: Secondary | ICD-10-CM | POA: Insufficient documentation

## 2021-04-18 DIAGNOSIS — M5135 Other intervertebral disc degeneration, thoracolumbar region: Secondary | ICD-10-CM | POA: Diagnosis not present

## 2021-04-18 DIAGNOSIS — M47816 Spondylosis without myelopathy or radiculopathy, lumbar region: Secondary | ICD-10-CM | POA: Insufficient documentation

## 2021-04-18 DIAGNOSIS — G8929 Other chronic pain: Secondary | ICD-10-CM | POA: Diagnosis not present

## 2021-04-18 DIAGNOSIS — M533 Sacrococcygeal disorders, not elsewhere classified: Secondary | ICD-10-CM | POA: Diagnosis not present

## 2021-04-18 DIAGNOSIS — M79604 Pain in right leg: Secondary | ICD-10-CM | POA: Diagnosis not present

## 2021-04-18 DIAGNOSIS — G894 Chronic pain syndrome: Secondary | ICD-10-CM | POA: Diagnosis not present

## 2021-04-18 MED ORDER — TRAMADOL HCL 50 MG PO TABS: 50.0000 mg | ORAL_TABLET | Freq: Three times a day (TID) | ORAL | 5 refills | Status: DC

## 2021-04-18 NOTE — Patient Instructions (Addendum)
Preparing for your procedure (without sedation) Instructions: Oral Intake: Do not eat or drink anything for at least 3 hours prior to your procedure. Transportation: Unless otherwise stated by your physician, you may drive yourself after the procedure. Blood Pressure Medicine: Take your blood pressure medicine with a sip of water the morning of the procedure. Insulin: Take only  of your normal insulin dose. Preventing infections: Shower with an antibacterial soap the morning of your procedure. Build-up your immune system: Take 1000 mg of Vitamin C with every meal (3 times a day) the day prior to your procedure. Pregnancy: If you are pregnant, call and cancel the procedure. Sickness: If you have a cold, fever, or any active infections, call and cancel the procedure. Arrival: You must be in the facility at least 30 minutes prior to your scheduled procedure. Children: Do not bring any children with you. Dress appropriately: Bring dark clothing that you would not mind if they get stained. Valuables: Do not bring any jewelry or valuables. Procedure appointments are reserved for interventional treatments only. No Prescription Refills. No medication changes will be discussed during procedure appointments. No disability issues will be discussed.   ____________________________________________________________________________________________  General Risks and Possible Complications  Patient Responsibilities: It is important that you read this as it is part of your informed consent. It is our duty to inform you of the risks and possible complications associated with treatments offered to you. It is your responsibility as a patient to read this and to ask questions about anything that is not clear or that you believe was not covered in this document.  Patient's Rights: You have the right to refuse treatment. You also have the right to change your mind, even after initially having agreed to have the  treatment done. However, under this last option, if you wait until the last second to change your mind, you may be charged for the materials used up to that point.  Introduction: Medicine is not an Chief Strategy Officer. Everything in Medicine, including the lack of treatment(s), carries the potential for danger, harm, or loss (which is by definition: Risk). In Medicine, a complication is a secondary problem, condition, or disease that can aggravate an already existing one. All treatments carry the risk of possible complications. The fact that a side effects or complications occurs, does not imply that the treatment was conducted incorrectly. It must be clearly understood that these can happen even when everything is done following the highest safety standards.  No treatment: You can choose not to proceed with the proposed treatment alternative. The "PRO(s)" would include: avoiding the risk of complications associated with the therapy. The "CON(s)" would include: not getting any of the treatment benefits. These benefits fall under one of three categories: diagnostic; therapeutic; and/or palliative. Diagnostic benefits include: getting information which can ultimately lead to improvement of the disease or symptom(s). Therapeutic benefits are those associated with the successful treatment of the disease. Finally, palliative benefits are those related to the decrease of the primary symptoms, without necessarily curing the condition (example: decreasing the pain from a flare-up of a chronic condition, such as incurable terminal cancer).  General Risks and Complications: These are associated to most interventional treatments. They can occur alone, or in combination. They fall under one of the following six (6) categories: no benefit or worsening of symptoms; bleeding; infection; nerve damage; allergic reactions; and/or death. No benefits or worsening of symptoms: In Medicine there are no guarantees, only probabilities. No  healthcare provider can ever guarantee  that a medical treatment will work, they can only state the probability that it may. Furthermore, there is always the possibility that the condition may worsen, either directly, or indirectly, as a consequence of the treatment. Bleeding: This is more common if the patient is taking a blood thinner, either prescription or over the counter (example: Goody Powders, Fish oil, Aspirin, Garlic, etc.), or if suffering a condition associated with impaired coagulation (example: Hemophilia, cirrhosis of the liver, low platelet counts, etc.). However, even if you do not have one on these, it can still happen. If you have any of these conditions, or take one of these drugs, make sure to notify your treating physician. Infection: This is more common in patients with a compromised immune system, either due to disease (example: diabetes, cancer, human immunodeficiency virus [HIV], etc.), or due to medications or treatments (example: therapies used to treat cancer and rheumatological diseases). However, even if you do not have one on these, it can still happen. If you have any of these conditions, or take one of these drugs, make sure to notify your treating physician. Nerve Damage: This is more common when the treatment is an invasive one, but it can also happen with the use of medications, such as those used in the treatment of cancer. The damage can occur to small secondary nerves, or to large primary ones, such as those in the spinal cord and brain. This damage may be temporary or permanent and it may lead to impairments that can range from temporary numbness to permanent paralysis and/or brain death. Allergic Reactions: Any time a substance or material comes in contact with our body, there is the possibility of an allergic reaction. These can range from a mild skin rash (contact dermatitis) to a severe systemic reaction (anaphylactic reaction), which can result in death. Death: In  general, any medical intervention can result in death, most of the time due to an unforeseen complication. ____________________________________________________________________________________________ ____________________________________________________________________________________________  Medication Rules  Purpose: To inform patients, and their family members, of our rules and regulations.  Applies to: All patients receiving prescriptions (written or electronic).  Pharmacy of record: Pharmacy where electronic prescriptions will be sent. If written prescriptions are taken to a different pharmacy, please inform the nursing staff. The pharmacy listed in the electronic medical record should be the one where you would like electronic prescriptions to be sent.  Electronic prescriptions: In compliance with the [place] (STOP) Act of 2017 (Session Lanny Cramp [phone]), effective July 29, 2018, all controlled substances must be electronically prescribed. Calling prescriptions to the pharmacy will cease to exist.  Prescription refills: Only during scheduled appointments. Applies to all prescriptions.  NOTE: The following applies primarily to controlled substances (Opioid* Pain Medications).   Type of encounter (visit): For patients receiving controlled substances, face-to-face visits are required. (Not an option or up to the patient.)  Patient's responsibilities: Pain Pills: Bring all pain pills to every appointment (except for procedure appointments). Pill Bottles: Bring pills in original pharmacy bottle. Always bring the newest bottle. Bring bottle, even if empty. Medication refills: You are responsible for knowing and keeping track of what medications you take and those you need refilled. The day before your appointment: write a list of all prescriptions that need to be refilled. The day of the appointment: give the list to the admitting nurse.  Prescriptions will be written only during appointments. No prescriptions will be written on procedure days. If you forget a medication: it will not be "  Called in", "Faxed", or "electronically sent". You will need to get another appointment to get these prescribed. No early refills. Do not call asking to have your prescription filled early. Prescription Accuracy: You are responsible for carefully inspecting your prescriptions before leaving our office. Have the discharge nurse carefully go over each prescription with you, before taking them home. Make sure that your name is accurately spelled, that your address is correct. Check the name and dose of your medication to make sure it is accurate. Check the number of pills, and the written instructions to make sure they are clear and accurate. Make sure that you are given enough medication to last until your next medication refill appointment. Taking Medication: Take medication as prescribed. When it comes to controlled substances, taking less pills or less frequently than prescribed is permitted and encouraged. Never take more pills than instructed. Never take medication more frequently than prescribed.  Inform other Doctors: Always inform, all of your healthcare providers, of all the medications you take. Pain Medication from other Providers: You are not allowed to accept any additional pain medication from any other Doctor or Healthcare provider. There are two exceptions to this rule. (see below) In the event that you require additional pain medication, you are responsible for notifying us, as stated below. Cough Medicine: Often these contain an opioid, such as codeine or hydrocodone. Never accept or take cough medicine containing these opioids if you are already taking an opioid* medication. The combination may cause respiratory failure and death. Medication Agreement: You are responsible for carefully reading and following our Medication Agreement. This  must be signed before receiving any prescriptions from our practice. Safely store a copy of your signed Agreement. Violations to the Agreement will result in no further prescriptions. (Additional copies of our Medication Agreement are available upon request.) Laws, Rules, & Regulations: All patients are expected to follow all Federal and [employer], [employer], Rules, [employer] [facility]. Ignorance of the Laws does not constitute a valid excuse.  Illegal drugs and Controlled Substances: The use of illegal substances (including, but not limited to marijuana and its derivatives) and/or the illegal use of any controlled substances is strictly prohibited. Violation of this rule may result in the immediate and permanent discontinuation of any and all prescriptions being written by our practice. The use of any illegal substances is prohibited. Adopted CDC guidelines & recommendations: Target dosing levels will be at or below 60 MME/day. Use of benzodiazepines** is not recommended.  Exceptions: There are only two exceptions to the rule of not receiving pain medications from other Healthcare Providers. Exception #1 (Emergencies): In the event of an emergency (i.e.: accident requiring emergency care), you are allowed to receive additional pain medication. However, you are responsible for: As soon as you are able, call our office [phone], at any time of the day or night, and leave a message stating your name, the date and nature of the emergency, and the name and dose of the medication prescribed. In the event that your call is answered by a member of our staff, make sure to document and save the date, time, and the name of the person that took your information.  Exception #2 (Planned Surgery): In the event that you are scheduled by another doctor or dentist to have any type of surgery or procedure, you are allowed (for a period no longer than 30 days), to receive additional pain medication, for the acute post-op pain.  However, in this case, you are responsible for picking  up a copy of our "Post-op Pain Management for Surgeons" handout, and giving it to your surgeon or dentist. This document is available at our office, and does not require an appointment to obtain it. Simply go to our office during business hours (Monday-Thursday from 8:00 AM to 4:00 PM) (Friday 8:00 AM to 12:00 Noon) or if you have a scheduled appointment with [place], prior to your surgery, and ask for it by name. In addition, you are responsible for: calling our office [phone], at any time of the day or night, and leaving a message stating your name, name of your surgeon, type of surgery, and date of procedure or surgery. Failure to comply with your responsibilities may result in termination of therapy involving the controlled substances.  *Opioid medications include: morphine, codeine, oxycodone, oxymorphone, hydrocodone, hydromorphone, meperidine, tramadol, tapentadol, buprenorphine, fentanyl, methadone. **Benzodiazepine medications include: diazepam (Valium), alprazolam (Xanax), clonazepam (Klonopine), lorazepam (Ativan), clorazepate (Tranxene), chlordiazepoxide (Librium), estazolam (Prosom), oxazepam (Serax), temazepam (Restoril), triazolam (Halcion) (Last updated: 06/26/2020) ____________________________________________________________________________________________  ____________________________________________________________________________________________  Medication Recommendations and Reminders  Applies to: All patients receiving prescriptions (written and/or electronic).  Medication Rules & Regulations: These rules and regulations exist for your safety and that of others. They are not flexible and neither are we. Dismissing or ignoring them will be considered "non-compliance" with medication therapy, resulting in complete and irreversible termination of such therapy. (See document titled "Medication Rules" for more details.) In all  conscience, because of safety reasons, we cannot continue providing a therapy where the patient does not follow instructions.  Pharmacy of record:  Definition: This is the pharmacy where your electronic prescriptions will be sent.  We do not endorse any particular pharmacy, however, we have experienced problems with [facility] not securing enough medication supply for the community. We do not restrict you in your choice of pharmacy. However, once we write for your prescriptions, we will NOT be re-sending more prescriptions to fix restricted supply problems created by your pharmacy, or your insurance.  The pharmacy listed in the electronic medical record should be the one where you want electronic prescriptions to be sent. If you choose to change pharmacy, simply notify our nursing staff.  Recommendations: Keep all of your pain medications in a safe place, under lock and key, even if you live alone. We will NOT replace lost, stolen, or damaged medication. After you fill your prescription, take 1 week's worth of pills and put them away in a safe place. You should keep a separate, properly labeled bottle for this purpose. The remainder should be kept in the original bottle. Use this as your primary supply, until it runs out. Once it's gone, then you know that you have 1 week's worth of medicine, and it is time to come in for a prescription refill. If you do this correctly, it is unlikely that you will ever run out of medicine. To make sure that the above recommendation works, it is very important that you make sure your medication refill appointments are scheduled at least 1 week before you run out of medicine. To do this in an effective manner, make sure that you do not leave the office without scheduling your next medication management appointment. Always ask the nursing staff to show you in your prescription , when your medication will be running out. Then arrange for the receptionist to get you a return  appointment, at least 7 days before you run out of medicine. Do not wait until you have 1 or 2 pills left, to  come in. This is very poor planning and does not take into consideration that we may need to cancel appointments due to bad weather, sickness, or emergencies affecting our staff. DO NOT ACCEPT A "Partial Fill": If for any reason your pharmacy does not have enough pills/tablets to completely fill or refill your prescription, do not allow for a "partial fill". The law allows the pharmacy to complete that prescription within 72 hours, without requiring a new prescription. If they do not fill the rest of your prescription within those 72 hours, you will need a separate prescription to fill the remaining amount, which we will NOT provide. If the reason for the partial fill is your insurance, you will need to talk to the pharmacist about payment alternatives for the remaining tablets, but again, DO NOT ACCEPT A PARTIAL FILL, unless you can trust your pharmacist to obtain the remainder of the pills within 72 hours.  Prescription refills and/or changes in medication(s):  Prescription refills, and/or changes in dose or medication, will be conducted only during scheduled medication management appointments. (Applies to both, written and electronic prescriptions.) No refills on procedure days. No medication will be changed or started on procedure days. No changes, adjustments, and/or refills will be conducted on a procedure day. Doing so will interfere with the diagnostic portion of the procedure. No phone refills. No medications will be "called into the pharmacy". No Fax refills. No weekend refills. No Holliday refills. No after hours refills.  Remember:  Business hours are:  Monday to Thursday 8:00 AM to 4:00 PM Provider's Schedule: Milinda Pointer, MD - Appointments are:  Medication management: Monday and Wednesday 8:00 AM to 4:00 PM Procedure day: Tuesday and Thursday 7:30 AM to 4:00 PM Gillis Santa, MD - Appointments are:  Medication management: Tuesday and Thursday 8:00 AM to 4:00 PM Procedure day: Monday and Wednesday 7:30 AM to 4:00 PM (Last update: 02/16/2020) ____________________________________________________________________________________________  ____________________________________________________________________________________________  CBD (cannabidiol) WARNING  Applicable to: All individuals currently taking or considering taking CBD (cannabidiol) and, more important, all patients taking opioid analgesic controlled substances (pain medication). (Example: oxycodone; oxymorphone; hydrocodone; hydromorphone; morphine; methadone; tramadol; tapentadol; fentanyl; buprenorphine; butorphanol; dextromethorphan; meperidine; codeine; etc.)  Legal status: CBD remains a Schedule I drug prohibited for any use. CBD is illegal with one exception. In the [place], CBD has a limited Transport planner (FDA) approval for the treatment of two specific types of epilepsy disorders. Only one CBD product has been approved by the FDA for this purpose: "Epidiolex". FDA is aware that some companies are marketing products containing cannabis and cannabis-derived compounds in ways that violate the [employer], Drug and Cosmetic Act [facility] Act) and that may put the health and safety of consumers at risk. The FDA, a Federal agency, has not enforced the CBD status since 2018.   Legality: Some manufacturers ship CBD products nationally, which is illegal. Often such products are sold online and are therefore available throughout the country. CBD is openly sold in head shops and health food stores in some states where such sales have not been explicitly legalized. Selling unapproved products with unsubstantiated therapeutic claims is not only a violation of the law, but also can put patients at risk, as these products have not been proven to be safe or effective. Federal illegality makes it  difficult to conduct research on CBD.  Reference: "FDA Regulation of Cannabis and Cannabis-Derived Products, Including Cannabidiol (CBD)" - [employer]  Warning: CBD is not FDA approved and has not undergo the  same manufacturing controls as prescription drugs.  This means that the purity and safety of available CBD may be questionable. Most of the time, despite manufacturer's claims, it is contaminated with THC (delta-9-tetrahydrocannabinol - the chemical in marijuana responsible for the "HIGH").  When this is the case, the [facility] contaminant will trigger a positive urine drug screen (UDS) test for Marijuana (carboxy-THC). Because a positive UDS for any illicit substance is a violation of our medication agreement, your opioid analgesics (pain medicine) may be permanently discontinued.  MORE ABOUT CBD  General Information: CBD  is a derivative of the Marijuana (cannabis sativa) plant discovered in 87. It is one of the 113 identified substances found in Marijuana. It accounts for up to 40% of the plant's extract. As of 2018, preliminary clinical studies on CBD included research for the treatment of anxiety, movement disorders, and pain. CBD is available and consumed in multiple forms, including inhalation of smoke or vapor, as an aerosol spray, and by mouth. It may be supplied as an oil containing CBD, capsules, dried cannabis, or as a liquid solution. CBD is thought not to be as psychoactive as THC (delta-9-tetrahydrocannabinol - the chemical in marijuana responsible for the "HIGH"). Studies suggest that CBD may interact with different biological target receptors in the body, including cannabinoid and other neurotransmitter receptors. As of 2018 the mechanism of action for its biological effects has not been determined.  Side-effects  Adverse reactions: Dry mouth, diarrhea, decreased  appetite, fatigue, drowsiness, malaise, weakness, sleep disturbances, and others.  Drug interactions: CBC may interact with other medications such as blood-thinners. (Last update: 03/04/2020) ____________________________________________________________________________________________

## 2021-04-18 NOTE — Progress Notes (Signed)
Nursing Pain Medication Assessment:  Safety precautions to be maintained throughout the outpatient stay will include: orient to surroundings, keep bed in low position, maintain call bell within reach at all times, provide assistance with transfer out of bed and ambulation.  Medication Inspection Compliance: Pill count conducted under aseptic conditions, in front of the patient. Neither the pills nor the bottle was removed from the patient's sight at any time. Once count was completed pills were immediately returned to the patient in their original bottle.  Medication: Tramadol (Ultram) Pill/Patch Count:  29 of 90 pills remain Pill/Patch Appearance: Markings consistent with prescribed medication Bottle Appearance: Standard pharmacy container. Clearly labeled. Filled Date: 06 / 29 / 2022 Last Medication intake:  Today

## 2021-04-23 NOTE — Progress Notes (Signed)
PROVIDER NOTE: Information contained herein reflects review and annotations entered in association with encounter. Interpretation of such information and data should be left to medically-trained personnel. Information provided to patient can be located elsewhere in the medical record under "Patient Instructions". Document created using STT-dictation technology, any transcriptional errors that may result from process are unintentional.    Patient: Brittney Simpson  Service Category: Procedure  Provider: Gaspar Cola, MD  DOB: 24-Feb-1928  DOS: 04/24/2021  Location: [facility]  MRN: [id number]  Setting: Ambulatory - outpatient  Referring Provider: Nobie Putnam *  Type: Established Patient  Specialty: Interventional Pain Management  PCP: Olin Hauser, DO   Primary Reason for Visit: Interventional Pain Management Treatment. CC: Back Pain (low)   Procedure:          Anesthesia, Analgesia, Anxiolysis:  Type: Therapeutic Inter-Laminar Epidural Steroid Injection           Region: Lumbar Level:  T12-L1  Level. Laterality: Right         Type: Local Anesthesia Local Anesthetic: Lidocaine 1-2% Sedation: None  Indication(s):  Analgesia Route: Infiltration ([place]/IM) IV Access: Declined   Position: Prone with head of the table was raised to facilitate breathing.   Indications: 1. DDD (degenerative disc disease), thoracolumbar   2. Chronic low back pain (1ry area of Pain) (Bilateral) (R>L) w/o sciatica   3. Chronic lumbar radicular pain (Right)   4. Lumbar foraminal stenosis   5. Lumbar lateral recess stenosis   6. Spinal stenosis of lumbar region with neurogenic claudication   7. T12 compression fracture, sequela   8. Spondylolisthesis of lumbosacral region (L2-3 and L5-S1)    Pain Score: Pre-procedure: 6 /10 Post-procedure: 4 /10    Pre-op H&P Assessment:  Brittney Simpson is a 87 y.o. (year old), female patient, seen today for interventional  treatment. She  has a past surgical history that includes Hernia repair; Hernia repair; Hemorrhoid surgery; Kyphoplasty (N/A, 01/23/2017); Kyphoplasty (N/A, 01/25/2020); and Cholecystectomy. Brittney Simpson has a current medication list which includes the following prescription(s): acetaminophen, amlodipine, vitamin d3, clonazepam, cranberry, furosemide, ibuprofen, latanoprost, levothyroxine, lisinopril, melatonin, metoprolol succinate, omeprazole, simethicone, sulfamethoxazole-trimethoprim, tamsulosin, tramadol, and trazodone. Her primarily concern today is the Back Pain (low)  Initial Vital Signs:  Pulse/HCG Rate: 60ECG Heart Rate: 71 Temp: (!) 97.1 F (36.2 C) Resp: 18 BP: (!) 154/82 SpO2: 100 %  BMI: Estimated body mass index is 20.6 kg/m as calculated from the following:   Height as of this encounter: 5\' 4"  (1.626 m).   Weight as of this encounter: 120 lb (54.4 kg).  Risk Assessment: Allergies: Reviewed. She has No Known Allergies.  Allergy Precautions: None required Coagulopathies: Reviewed. None identified.  Blood-thinner therapy: None at this time Active Infection(s): Reviewed. None identified. Brittney Simpson is afebrile  Site Confirmation: Brittney Simpson was asked to confirm the procedure and laterality before marking the site Procedure checklist: Completed Consent: Before the procedure and under the influence of no sedative(s), amnesic(s), or anxiolytics, the patient was informed of the treatment options, risks and possible complications. To fulfill our ethical and legal obligations, as recommended by the American Medical Association's Code of Ethics, I have informed the patient of my clinical impression; the nature and purpose of the treatment or procedure; the risks, benefits, and possible complications of the intervention; the alternatives, including doing nothing; the risk(s) and benefit(s) of the alternative treatment(s) or procedure(s); and the risk(s) and benefit(s) of doing  nothing. The patient was provided information about the general  risks and possible complications associated with the procedure. These may include, but are not limited to: failure to achieve desired goals, infection, bleeding, organ or nerve damage, allergic reactions, paralysis, and death. In addition, the patient was informed of those risks and complications associated to Spine-related procedures, such as failure to decrease pain; infection (i.e.: Meningitis, epidural or intraspinal abscess); bleeding (i.e.: epidural hematoma, subarachnoid hemorrhage, or any other type of intraspinal or peri-dural bleeding); organ or nerve damage (i.e.: Any type of peripheral nerve, nerve root, or spinal cord injury) with subsequent damage to sensory, motor, and/or autonomic systems, resulting in permanent pain, numbness, and/or weakness of one or several areas of the body; allergic reactions; (i.e.: anaphylactic reaction); and/or death. Furthermore, the patient was informed of those risks and complications associated with the medications. These include, but are not limited to: allergic reactions (i.e.: anaphylactic or anaphylactoid reaction(s)); adrenal axis suppression; blood sugar elevation that in diabetics may result in ketoacidosis or comma; water retention that in patients with history of congestive heart failure may result in shortness of breath, pulmonary edema, and decompensation with resultant heart failure; weight gain; swelling or edema; medication-induced neural toxicity; particulate matter embolism and blood vessel occlusion with resultant organ, and/or nervous system infarction; and/or aseptic necrosis of one or more joints. Finally, the patient was informed that Medicine is not an exact science; therefore, there is also the possibility of unforeseen or unpredictable risks and/or possible complications that may result in a catastrophic outcome. The patient indicated having understood very clearly. We have given  the patient no guarantees and we have made no promises. Enough time was given to the patient to ask questions, all of which were answered to the patient's satisfaction. Brittney Simpson has indicated that she wanted to continue with the procedure. Attestation: I, the ordering provider, attest that I have discussed with the patient the benefits, risks, side-effects, alternatives, likelihood of achieving goals, and potential problems during recovery for the procedure that I have provided informed consent. Date  Time: 04/24/2021 10:35 AM  Pre-Procedure Preparation:  Monitoring: As per clinic protocol. Respiration, ETCO2, SpO2, BP, heart rate and rhythm monitor placed and checked for adequate function Safety Precautions: Patient was assessed for positional comfort and pressure points before starting the procedure. Time-out: I initiated and conducted the "Time-out" before starting the procedure, as per protocol. The patient was asked to participate by confirming the accuracy of the "Time Out" information. Verification of the correct person, site, and procedure were performed and confirmed by me, the nursing staff, and the patient. "Time-out" conducted as per Joint Commission's Universal Protocol (UP.01.01.01). Time: 1109  Description of Procedure:          Target Area: The interlaminar space, initially targeting the lower laminar border of the superior vertebral body. Approach: Paramedial approach. Area Prepped: Entire Posterior Lumbar Region DuraPrep (Iodine Povacrylex [0.7% available iodine] and Isopropyl Alcohol, 74% w/w) Safety Precautions: Aspiration looking for blood return was conducted prior to all injections. At no point did we inject any substances, as a needle was being advanced. No attempts were made at seeking any paresthesias. Safe injection practices and needle disposal techniques used. Medications properly checked for expiration dates. SDV (single dose vial) medications used. Description of  the Procedure: Protocol guidelines were followed. The procedure needle was introduced through the skin, ipsilateral to the reported pain, and advanced to the target area. Bone was contacted and the needle walked caudad, until the lamina was cleared. The epidural space was identified using "loss-of-resistance technique" with  2-3 ml of PF-NaCl (0.9% NSS), in a 5cc LOR glass syringe.  Vitals:   04/24/21 1034 04/24/21 1110 04/24/21 1115  BP: (!) 154/82 (!) 173/86 (!) 162/90  Pulse: 60    Resp: 18 17 16   Temp: (!) 97.1 F (36.2 C)    SpO2: 100% 97% 96%  Weight: 120 lb (54.4 kg)    Height: 5\' 4"  (1.626 m)      Start Time: 1109 hrs. End Time: 1115 hrs.  Materials:  Needle(s) Type: Epidural needle Gauge: 17G Length: 3.5-in Medication(s): Please see orders for medications and dosing details.  Imaging Guidance (Spinal):          Type of Imaging Technique: Fluoroscopy Guidance (Spinal) Indication(s): Assistance in needle guidance and placement for procedures requiring needle placement in or near specific anatomical locations not easily accessible without such assistance. Exposure Time: Please see nurses notes. Contrast: Before injecting any contrast, we confirmed that the patient did not have an allergy to iodine, shellfish, or radiological contrast. Once satisfactory needle placement was completed at the desired level, radiological contrast was injected. Contrast injected under live fluoroscopy. No contrast complications. See chart for type and volume of contrast used. Fluoroscopic Guidance: I was personally present during the use of fluoroscopy. "Tunnel Vision Technique" used to obtain the best possible view of the target area. Parallax error corrected before commencing the procedure. "Direction-depth-direction" technique used to introduce the needle under continuous pulsed fluoroscopy. Once target was reached, antero-posterior, oblique, and lateral fluoroscopic projection used confirm needle  placement in all planes. Images permanently stored in EMR. Interpretation: I personally interpreted the imaging intraoperatively. Adequate needle placement confirmed in multiple planes. Appropriate spread of contrast into desired area was observed. No evidence of afferent or efferent intravascular uptake. No intrathecal or subarachnoid spread observed. Permanent images saved into the patient's record.  Antibiotic Prophylaxis:   Anti-infectives (From admission, onward)    None      Indication(s): None identified  Post-operative Assessment:  Post-procedure Vital Signs:  Pulse/HCG Rate: 6071 Temp: (!) 97.1 F (36.2 C) Resp: 16 BP: (!) 162/90 SpO2: 96 %  EBL: None  Complications: No immediate post-treatment complications observed by team, or reported by patient.  Note: The patient tolerated the entire procedure well. A repeat set of vitals were taken after the procedure and the patient was kept under observation following institutional policy, for this type of procedure. Post-procedural neurological assessment was performed, showing return to baseline, prior to discharge. The patient was provided with post-procedure discharge instructions, including a section on how to identify potential problems. Should any problems arise concerning this procedure, the patient was given instructions to immediately contact us, at any time, without hesitation. In any case, we plan to contact the patient by telephone for a follow-up status report regarding this interventional procedure.  Comments:  No additional relevant information.  Plan of Care  Orders:  Orders Placed This Encounter  Procedures   Lumbar Epidural Injection    Scheduling Instructions:     Procedure: Interlaminar LESI T12-L1     Laterality: Right-sided     Sedation: No Sedation     Timeframe:  Today    Order Specific Question:   Where will this procedure be performed?    Answer:   [facility]   DG [facility] C-ARM 1-60  MIN NO REPORT    Intraoperative interpretation by procedural physician at [facility].    Standing Status:   Standing    Number of Occurrences:   1  Order Specific Question:   Reason for exam:    Answer:   Assistance in needle guidance and placement for procedures requiring needle placement in or near specific anatomical locations not easily accessible without such assistance.   Informed Consent Details: Physician/Practitioner Attestation; Transcribe to consent form and obtain patient signature    Note: Always confirm laterality of pain with Ms. Liera, before procedure. Transcribe to consent form and obtain patient signature.    Order Specific Question:   Physician/Practitioner attestation of informed consent for procedure/surgical case    Answer:   I, the physician/practitioner, attest that I have discussed with the patient the benefits, risks, side effects, alternatives, likelihood of achieving goals and potential problems during recovery for the procedure that I have provided informed consent.    Order Specific Question:   Procedure    Answer:   Lumbar epidural steroid injection under fluoroscopic guidance    Order Specific Question:   Physician/Practitioner performing the procedure    Answer:   Dayami Taitt A. Dossie Arbour, MD    Order Specific Question:   Indication/Reason    Answer:   Low back and/or lower extremity pain secondary to lumbar radiculitis   Provide equipment / supplies at bedside    "Epidural Tray" (Disposable  single use) Catheter: NOT required    Standing Status:   Standing    Number of Occurrences:   1    Order Specific Question:   Specify    Answer:   Epidural Tray    Chronic Opioid Analgesic:  Tramadol 50 mg, 1 tab PO q 8 hrs (150 mg/day of tramadol) MME/day: 15 mg/day.   Medications ordered for procedure: Meds ordered this encounter  Medications   lidocaine (XYLOCAINE) 2 % (with pres) injection 400 mg   pentafluoroprop-tetrafluoroeth (GEBAUERS)  aerosol   sodium chloride flush (NS) 0.9 % injection 2 mL   ropivacaine (PF) 2 mg/mL (0.2%) (NAROPIN) injection 2 mL   triamcinolone acetonide (KENALOG-40) injection 40 mg   iohexol (OMNIPAQUE) 180 MG/ML injection 10 mL    Must be Myelogram-compatible. If not available, you may substitute with a water-soluble, non-ionic, hypoallergenic, myelogram-compatible radiological contrast medium.    Medications administered: We administered lidocaine, pentafluoroprop-tetrafluoroeth, sodium chloride flush, ropivacaine (PF) 2 mg/mL (0.2%), triamcinolone acetonide, and iohexol.  See the medical record for exact dosing, route, and time of administration.  Follow-up plan:   Return in about 2 weeks (around 05/08/2021) for Proc-day (T,Th), (VV), (PPE).       Interventional Therapies  Risk  Complexity Considerations:   Estimated body mass index is 20.19 kg/m as calculated from the following:   Height as of this encounter: 5\' 3"  (1.6 m).   Weight as of this encounter: 114 lb (51.7 kg). Note: Hard of hearing   Planned  Pending:   Pending further evaluation   Under consideration:   Possible bilateral lumbar facet RFA. (Offerred several times. Pt. chooses to hold.) Diagnostic right hip joint injection  Diagnostic right L3 TFESI  Diagnostic right trochanteric bursa injection  Diagnostic right SI joint block  Diagnostic midline caudal ESI (for the tailbone pain)   Completed:   Palliative right T12-L1 LESI x3 (11/28/2020)  Therapeutic right L2-3 LESI x1 (03/07/2020)  Diagnostic/therapeutic bilateral SI joint injection x1 (02/10/2020)  Palliative right lumbar facet block x13 (01/04/2020)  Palliative left lumbar facet block x11 (01/04/2020)    Therapeutic  Palliative (PRN) options:   Palliative left T12-L1 LESI #4  Palliative right lumbar facet block #14  Palliative left lumbar facet block #12  Recent Visits Date Type Provider Dept  04/18/21 Office Visit Milinda Pointer, MD [facility]  02/20/21 Telemedicine Milinda Pointer, MD [facility]  02/06/21 Procedure visit Milinda Pointer, MD [facility]  01/24/21 Office Visit Milinda Pointer, MD [facility]  Showing recent visits within past 90 days and meeting all other requirements Today's Visits Date Type Provider Dept  04/24/21 Procedure visit Milinda Pointer, MD [facility]  Showing today's visits and meeting all other requirements Future Appointments Date Type Provider Dept  05/10/21 Appointment Milinda Pointer, MD [facility]  Showing future appointments within next 90 days and meeting all other requirements Disposition: Discharge home  Discharge (Date  Time): 04/24/2021; 1130 hrs.   Primary Care Physician: Olin Hauser, DO Location: [facility] Note by: Gaspar Cola, MD Date: 04/24/2021; Time: 12:34 PM  Disclaimer:  Medicine is not an Chief Strategy Officer. The only guarantee in medicine is that nothing is guaranteed. It is important to note that the decision to proceed with this intervention was based on the information collected from the patient. The Data and conclusions were drawn from the patient's questionnaire, the interview, and the physical examination. Because the information was provided in large part by the patient, it cannot be guaranteed that it has not been purposely or unconsciously manipulated. Every effort has been made to obtain as much relevant data as possible for this evaluation. It is important to note that the conclusions that lead to this procedure are derived in large part from the available data. Always take into account that the treatment will also be dependent on availability of resources and existing treatment guidelines, considered by other Pain Management Practitioners as being common knowledge and practice, at the time of the intervention. For Medico-Legal purposes, it is also  important to point out that variation in procedural techniques and pharmacological choices are the acceptable norm. The indications, contraindications, technique, and results of the above procedure should only be interpreted and judged by a Board-Certified Interventional Pain Specialist with extensive familiarity and expertise in the same exact procedure and technique.

## 2021-04-24 ENCOUNTER — Encounter: Payer: Self-pay | Admitting: Pain Medicine

## 2021-04-24 ENCOUNTER — Ambulatory Visit
Admission: RE | Admit: 2021-04-24 | Discharge: 2021-04-24 | Disposition: A | Discharge status: Home / Self Care | Payer: Medicare Other | Source: Ambulatory Visit | Attending: Pain Medicine | Admitting: Pain Medicine

## 2021-04-24 ENCOUNTER — Ambulatory Visit (HOSPITAL_BASED_OUTPATIENT_CLINIC_OR_DEPARTMENT_OTHER): Payer: Medicare Other | Admitting: Pain Medicine

## 2021-04-24 ENCOUNTER — Other Ambulatory Visit: Payer: Self-pay

## 2021-04-24 VITALS — BP 162/90 | HR 60 | Temp 97.1°F | Resp 16 | Ht 64.0 in | Wt 120.0 lb

## 2021-04-24 DIAGNOSIS — M5135 Other intervertebral disc degeneration, thoracolumbar region: Secondary | ICD-10-CM | POA: Insufficient documentation

## 2021-04-24 DIAGNOSIS — M48061 Spinal stenosis, lumbar region without neurogenic claudication: Secondary | ICD-10-CM | POA: Diagnosis not present

## 2021-04-24 DIAGNOSIS — M4317 Spondylolisthesis, lumbosacral region: Secondary | ICD-10-CM | POA: Insufficient documentation

## 2021-04-24 DIAGNOSIS — S22080S Wedge compression fracture of T11-T12 vertebra, sequela: Secondary | ICD-10-CM | POA: Diagnosis not present

## 2021-04-24 DIAGNOSIS — M48062 Spinal stenosis, lumbar region with neurogenic claudication: Secondary | ICD-10-CM

## 2021-04-24 DIAGNOSIS — M545 Low back pain, unspecified: Secondary | ICD-10-CM

## 2021-04-24 DIAGNOSIS — G8929 Other chronic pain: Secondary | ICD-10-CM | POA: Insufficient documentation

## 2021-04-24 DIAGNOSIS — M5416 Radiculopathy, lumbar region: Secondary | ICD-10-CM | POA: Diagnosis not present

## 2021-04-24 MED ORDER — TRIAMCINOLONE ACETONIDE 40 MG/ML IJ SUSP
INTRAMUSCULAR | Status: AC
  Filled 2021-04-24: qty 1

## 2021-04-24 MED ORDER — ROPIVACAINE HCL 2 MG/ML IJ SOLN
2.0000 mL | Freq: Once | INTRAMUSCULAR | Status: AC
  Administered 2021-04-24: 20 mL via EPIDURAL

## 2021-04-24 MED ORDER — ROPIVACAINE HCL 2 MG/ML IJ SOLN
INTRAMUSCULAR | Status: AC
  Filled 2021-04-24: qty 20

## 2021-04-24 MED ORDER — IOHEXOL 180 MG/ML  SOLN
INTRAMUSCULAR | Status: AC
  Filled 2021-04-24: qty 20

## 2021-04-24 MED ORDER — LIDOCAINE HCL 2 % IJ SOLN
20.0000 mL | Freq: Once | INTRAMUSCULAR | Status: AC
  Administered 2021-04-24: 400 mg
  Filled 2021-04-24: qty 20

## 2021-04-24 MED ORDER — SODIUM CHLORIDE 0.9% FLUSH
2.0000 mL | Freq: Once | INTRAVENOUS | Status: AC
  Administered 2021-04-24: 10 mL

## 2021-04-24 MED ORDER — TRIAMCINOLONE ACETONIDE 40 MG/ML IJ SUSP
40.0000 mg | Freq: Once | INTRAMUSCULAR | Status: AC
  Administered 2021-04-24: 40 mg
  Filled 2021-04-24: qty 1

## 2021-04-24 MED ORDER — IOHEXOL 180 MG/ML  SOLN
10.0000 mL | Freq: Once | INTRAMUSCULAR | Status: AC
  Administered 2021-04-24: 5 mL via EPIDURAL

## 2021-04-24 MED ORDER — PENTAFLUOROPROP-TETRAFLUOROETH EX AERO
INHALATION_SPRAY | Freq: Once | CUTANEOUS | Status: AC
  Administered 2021-04-24: 30 via TOPICAL
  Filled 2021-04-24: qty 116

## 2021-04-24 NOTE — Patient Instructions (Signed)

## 2021-04-24 NOTE — Progress Notes (Signed)
Safety precautions to be maintained throughout the outpatient stay will include: orient to surroundings, keep bed in low position, maintain call bell within reach at all times, provide assistance with transfer out of bed and ambulation.  

## 2021-04-25 ENCOUNTER — Other Ambulatory Visit: Payer: Self-pay | Admitting: Family Medicine

## 2021-04-25 ENCOUNTER — Telehealth: Payer: Self-pay

## 2021-04-25 DIAGNOSIS — N183 Chronic kidney disease, stage 3 unspecified: Secondary | ICD-10-CM

## 2021-04-25 DIAGNOSIS — I129 Hypertensive chronic kidney disease with stage 1 through stage 4 chronic kidney disease, or unspecified chronic kidney disease: Secondary | ICD-10-CM

## 2021-04-25 NOTE — Telephone Encounter (Signed)
Post procedure phone call.  Patients daughter states she is doing well.

## 2021-04-30 ENCOUNTER — Ambulatory Visit (INDEPENDENT_AMBULATORY_CARE_PROVIDER_SITE_OTHER): Payer: Medicare Other | Admitting: Urology

## 2021-04-30 ENCOUNTER — Encounter: Payer: Self-pay | Admitting: Urology

## 2021-04-30 ENCOUNTER — Other Ambulatory Visit: Payer: Self-pay

## 2021-04-30 VITALS — BP 163/76 | HR 62 | Ht 64.0 in | Wt 120.0 lb

## 2021-04-30 DIAGNOSIS — R339 Retention of urine, unspecified: Secondary | ICD-10-CM | POA: Diagnosis not present

## 2021-04-30 DIAGNOSIS — N3281 Overactive bladder: Secondary | ICD-10-CM

## 2021-04-30 MED ORDER — TAMSULOSIN HCL 0.4 MG PO CAPS: 0.4000 mg | ORAL_CAPSULE | Freq: Every day | ORAL | 11 refills | Status: DC

## 2021-04-30 NOTE — Progress Notes (Signed)
04/30/2021 9:37 AM   [name] July 16, 1928 [id number]  Referring provider: Olin Hauser, DO [address] [place],  [place] [address]  Chief Complaint  Patient presents with   Follow-up    HPI: Consulted to assess the patient for possible bladder infection and incontinence.  History was limited and patient's daughter was helpful.  Patient is hard of hearing.  It appeared she was wearing 2 depends a day in the last 2 weeks she is not leaking but has trouble to go.  She will feel an urge and then she cannot go.  She voids 4-5 times a day.  She will then double void and strain.  She says she is not getting up at night to urinate   Patient likely having obstructive voiding secondary to overactive bladder.  Her referring doctor wanted to place her on mirabegron which I think is a good choice but apparently it was expensive.  Because of the obstructive component I decided to give her Flomax and perform cystoscopy in the next 3 weeks and proceed accordingly.   Last culture negative Patient clinically not infected today with little bit of burning yesterday.  Urine sent for culture.  Urine looks good She did not want the cystoscopy because the Flomax has dramatically improved her symptoms.  Flow much better and she feels empty  Today [place] improved.  Flow improved.  Little bit of burning today.   PMH: Past Medical History:  Diagnosis Date   Anxiety    Arthritis    Back ache    Hiatal hernia    Hyperlipidemia    Hypertension    Hypothyroid    Osteopenia    Thyroid disease    UTI (urinary tract infection)     Surgical History: Past Surgical History:  Procedure Laterality Date   CHOLECYSTECTOMY     HEMORRHOID SURGERY     HERNIA REPAIR     HERNIA REPAIR     KYPHOPLASTY N/A 01/23/2017   Procedure: [id number];  Surgeon: Hessie Knows, MD;  Location: [facility];  Service: Orthopedics;  Laterality: N/A;   KYPHOPLASTY N/A 01/25/2020   Procedure: L4 KYPHOPLASTY;   Surgeon: Hessie Knows, MD;  Location: [facility];  Service: Orthopedics;  Laterality: N/A;    Home Medications:  Allergies as of 04/30/2021   No Known Allergies      Medication List        Accurate as of April 30, 2021  9:37 AM. If you have any questions, ask your nurse or doctor.          acetaminophen 500 MG tablet Commonly known as: TYLENOL Take 1,000 mg by mouth daily as needed for moderate pain or headache.   amLODipine 5 MG tablet Commonly known as: NORVASC Take 1 tablet (5 mg total) by mouth daily with lunch.   clonazePAM 0.5 MG tablet Commonly known as: KLONOPIN Take 0.5 tablets (0.25 mg total) by mouth 2 (two) times daily.   Cranberry 500 MG Caps Take 1,000 mg by mouth daily with supper.   furosemide 20 MG tablet Commonly known as: LASIX TAKE 1 and 1/2 TABLETS BY MOUTH ONCE DAILY   ibuprofen 200 MG tablet Commonly known as: Motrin IB Take 1 tablet (200 mg total) by mouth every 6 (six) hours as needed.   latanoprost 0.005 % ophthalmic solution Commonly known as: XALATAN Place 1 drop into both eyes at bedtime.   levothyroxine 112 MCG tablet Commonly known as: SYNTHROID Take 1 tablet (112 mcg total) by mouth  daily before breakfast.   lisinopril 40 MG tablet Commonly known as: ZESTRIL Take 1 tablet (40 mg total) by mouth daily. What changed: when to take this   Melatonin 10 MG Tabs Take 10 mg by mouth at bedtime.   metoprolol succinate 100 MG 24 hr tablet Commonly known as: TOPROL-XL TAKE 1 TABLET BY MOUTH ONCE DAILY WITH FOOD   omeprazole 20 MG capsule Commonly known as: PRILOSEC Take 1 capsule (20 mg total) by mouth daily before breakfast.   simethicone 125 MG chewable tablet Commonly known as: MYLICON Chew 588 mg by mouth daily with supper.   sulfamethoxazole-trimethoprim 400-80 MG tablet Commonly known as: BACTRIM Take 1 tablet by mouth every evening.   tamsulosin 0.4 MG Caps capsule Commonly known as: FLOMAX Take 1 capsule (0.4 mg  total) by mouth daily.   traMADol 50 MG tablet Commonly known as: ULTRAM Take 1 tablet (50 mg total) by mouth 3 (three) times daily. Each refill must last 30 days.   traZODone 50 MG tablet Commonly known as: DESYREL Take 1 tablet (50 mg total) by mouth at bedtime.   Vitamin D3 50 MCG (2000 UT) Tabs Take 2,000 Units by mouth daily with lunch.        Allergies: No Known Allergies  Family History: Family History  Problem Relation Age of Onset   Stroke Mother    Cancer Father     Social History:  reports that she has never smoked. She has never used smokeless tobacco. She reports that she does not drink alcohol and does not use drugs.  ROS:                                        Physical Exam: Ht 5\' 4"  (1.626 m)   BMI 20.60 kg/m   Constitutional:  Alert and oriented, No acute distress.   Laboratory Data: Lab Results  Component Value Date   WBC 5.3 03/07/2021   HGB 11.3 (L) 03/07/2021   HCT 37.0 03/07/2021   MCV 79.9 (L) 03/07/2021   PLT 238 03/07/2021    Lab Results  Component Value Date   CREATININE 0.75 03/07/2021    No results found for: PSA  No results found for: TESTOSTERONE  Lab Results  Component Value Date   HGBA1C 5.2 05/16/2020    Urinalysis    Component Value Date/Time   COLORURINE YELLOW (A) 03/20/2021 1536   APPEARANCEUR CLEAR (A) 03/20/2021 1536   APPEARANCEUR Clear 10/23/2020 1028   LABSPEC 1.011 03/20/2021 1536   LABSPEC 1.003 07/08/2014 0907   PHURINE 6.0 03/20/2021 1536   GLUCOSEU NEGATIVE 03/20/2021 1536   GLUCOSEU Negative 07/08/2014 0907   HGBUR NEGATIVE 03/20/2021 1536   BILIRUBINUR NEGATIVE 03/20/2021 1536   BILIRUBINUR Negative 10/23/2020 1028   BILIRUBINUR Negative 07/08/2014 0907   KETONESUR NEGATIVE 03/20/2021 1536   PROTEINUR NEGATIVE 03/20/2021 1536   NITRITE NEGATIVE 03/20/2021 1536   LEUKOCYTESUR NEGATIVE 03/20/2021 1536   LEUKOCYTESUR Negative 07/08/2014 0907    Pertinent  Imaging:   Assessment & Plan: 30x11 Flomax sent.  See in 1 year.  Send urine for culture.  There are no diagnoses linked to this encounter.  No follow-ups on file.  Reece Packer, MD  [facility] [address], [place], [place] [address] [phone]

## 2021-04-30 NOTE — Addendum Note (Signed)
Addended by: Verlene Mayer A on: 04/30/2021 10:08 AM   Modules accepted: Orders

## 2021-05-01 LAB — URINALYSIS, COMPLETE
Bilirubin, UA: NEGATIVE
Glucose, UA: NEGATIVE
Ketones, UA: NEGATIVE
Leukocytes,UA: NEGATIVE
Nitrite, UA: NEGATIVE
Protein,UA: NEGATIVE
RBC, UA: NEGATIVE
Specific Gravity, UA: 1.015 (ref 1.005–1.030)
Urobilinogen, Ur: 0.2 mg/dL (ref 0.2–1.0)
pH, UA: 7 (ref 5.0–7.5)

## 2021-05-01 LAB — MICROSCOPIC EXAMINATION
Bacteria, UA: NONE SEEN
RBC, Urine: NONE SEEN /hpf (ref 0–2)

## 2021-05-04 LAB — CULTURE, URINE COMPREHENSIVE

## 2021-05-10 ENCOUNTER — Ambulatory Visit: Payer: Medicare Other | Attending: Pain Medicine | Admitting: Pain Medicine

## 2021-05-10 ENCOUNTER — Other Ambulatory Visit: Payer: Self-pay

## 2021-05-10 DIAGNOSIS — G8929 Other chronic pain: Secondary | ICD-10-CM | POA: Diagnosis not present

## 2021-05-10 DIAGNOSIS — M5416 Radiculopathy, lumbar region: Secondary | ICD-10-CM

## 2021-05-10 DIAGNOSIS — M48062 Spinal stenosis, lumbar region with neurogenic claudication: Secondary | ICD-10-CM

## 2021-05-10 DIAGNOSIS — M48061 Spinal stenosis, lumbar region without neurogenic claudication: Secondary | ICD-10-CM

## 2021-05-10 DIAGNOSIS — S22080S Wedge compression fracture of T11-T12 vertebra, sequela: Secondary | ICD-10-CM | POA: Diagnosis not present

## 2021-05-10 DIAGNOSIS — M5135 Other intervertebral disc degeneration, thoracolumbar region: Secondary | ICD-10-CM

## 2021-05-10 DIAGNOSIS — M545 Low back pain, unspecified: Secondary | ICD-10-CM | POA: Diagnosis not present

## 2021-05-10 DIAGNOSIS — M79604 Pain in right leg: Secondary | ICD-10-CM | POA: Diagnosis not present

## 2021-05-10 NOTE — Progress Notes (Signed)
Patient: Brittney Simpson  Service Category: E/M  Provider: Gaspar Cola, MD  DOB: 10-Nov-1927  DOS: 05/10/2021  Location: Office  MRN: [id number]  Setting: Ambulatory outpatient  Referring Provider: Nobie Putnam *  Type: Established Patient  Specialty: Interventional Pain Management  PCP: Olin Hauser, DO  Location: Remote location  Delivery: TeleHealth     Virtual Encounter - Pain Management PROVIDER NOTE: Information contained herein reflects review and annotations entered in association with encounter. Interpretation of such information and data should be left to medically-trained personnel. Information provided to patient can be located elsewhere in the medical record under "Patient Instructions". Document created using STT-dictation technology, any transcriptional errors that may result from process are unintentional.    Contact & Pharmacy Preferred: [place]: [phone] (home) Mobile: [phone] (mobile) E-mail: [email or web address]  [place], [address] [place] [address] Phone: [phone] Fax: [phone]   Pre-screening  Brittney Simpson offered "in-person" vs "virtual" encounter. She indicated preferring virtual for this encounter.   Reason COVID-19*  Social distancing based on CDC and AMA recommendations.   I contacted Brittney Simpson on 05/10/2021 via telephone.      I clearly identified myself as Gaspar Cola, MD. I verified that I was speaking with the correct person using two identifiers (Name: Brittney Simpson, and date of birth: Sep 24, 1927).  Consent I sought verbal advanced consent from Brittney Simpson for virtual visit interactions. I informed Brittney Simpson of possible security and privacy concerns, risks, and limitations associated with providing "not-in-person" medical evaluation and management services. I also informed Brittney Simpson of the availability of "in-person"  appointments. Finally, I informed her that there would be a charge for the virtual visit and that she could be  personally, fully or partially, financially responsible for it. [name] expressed understanding and agreed to proceed.   Historic Elements   Brittney Simpson is a 87 y.o. year old, female patient evaluated today after our last contact on 04/24/2021. Brittney Simpson  has a past medical history of Anxiety, Arthritis, Back ache, Hiatal hernia, Hyperlipidemia, Hypertension, Hypothyroid, Osteopenia, Thyroid disease, and UTI (urinary tract infection). She also  has a past surgical history that includes Hernia repair; Hernia repair; Hemorrhoid surgery; Kyphoplasty (N/A, 01/23/2017); Kyphoplasty (N/A, 01/25/2020); and Cholecystectomy. Brittney Simpson has a current medication list which includes the following prescription(s): acetaminophen, amlodipine, amoxicillin, vitamin d3, clonazepam, cranberry, furosemide, ibuprofen, latanoprost, levothyroxine, lisinopril, melatonin, metoprolol succinate, omeprazole, sulfamethoxazole-trimethoprim, tamsulosin, tramadol, and trazodone. She  reports that she has never smoked. She has never used smokeless tobacco. She reports that she does not drink alcohol and does not use drugs. Brittney Simpson has No Known Allergies.   HPI  Today, she is being contacted for a post-procedure assessment.  As usual, the patient continues to get excellent benefit from this palliative injections.  The duration of the benefit is depending on the level of activity and what may be causing the recurrence of the inflammatory process.  However, so far we are getting at least 2 to 3 months in between treatments.  She does have some serious pathology in her spine, but at age 87 y/o with osteoporosis, understandably they are trying to avoid any type of major spinal surgery.  Post-Procedure Evaluation  Procedure (04/24/2021):  Procedure:           Anesthesia, Analgesia, Anxiolysis:  Type:  Therapeutic Inter-Laminar Epidural Steroid Injection  Region: Lumbar Level:  T12-L1  Level. Laterality: Right          Type: Local Anesthesia Local Anesthetic: Lidocaine 1-2% Sedation: None  Indication(s):  Analgesia Route: Infiltration ([place]/IM) IV Access: Declined     Position: Prone with head of the table was raised to facilitate breathing.    Indications: 1. DDD (degenerative disc disease), thoracolumbar   2. Chronic low back pain (1ry area of Pain) (Bilateral) (R>L) w/o sciatica   3. Chronic lumbar radicular pain (Right)   4. Lumbar foraminal stenosis   5. Lumbar lateral recess stenosis   6. Spinal stenosis of lumbar region with neurogenic claudication   7. T12 compression fracture, sequela   8. Spondylolisthesis of lumbosacral region (L2-3 and L5-S1)     Pain Score: Pre-procedure: 6 /10 Post-procedure: 4 /10   Anxiolysis: none.  Effectiveness during initial hour after procedure (Ultra-Short Term Relief): 75 %.  Local anesthetic used: Long-acting (4-6 hours) Effectiveness: Defined as any analgesic benefit obtained secondary to the administration of local anesthetics. This carries significant diagnostic value as to the etiological location, or anatomical origin, of the pain. Duration of benefit is expected to coincide with the duration of the local anesthetic used.  Effectiveness during initial 4-6 hours after procedure (Short-Term Relief): 75 %.  Long-term benefit: Defined as any relief past the pharmacologic duration of the local anesthetics.  Effectiveness past the initial 6 hours after procedure (Long-Term Relief): 75 % (patients daughter states that she feels that the facets gave a longer lasting benefit.  She feels like the LESI last approx 45 - 60 days.).  Benefits, current: Defined as benefit present at the time of this evaluation.   Analgesia: According to the patient's daughter she has attained an ongoing 75 to 80% relief of the pain which we hope will last  for quite some time.  She had some questions for me as to how often she can have this repeated and I have informed her that I rather space them as far apart as we can, but if we had to repeated, I would definitely prefer it to be no sooner than every other month.  She understood and accepted. Function: Brittney Simpson reports improvement in function ROM: Brittney Simpson reports improvement in ROM   Pharmacotherapy Assessment   Analgesic: Tramadol 50 mg, 1 tab PO q 8 hrs (150 mg/day of tramadol) MME/day: 15 mg/day.   Monitoring: [place] PMP: PDMP reviewed during this encounter.       Pharmacotherapy: No side-effects or adverse reactions reported. Compliance: No problems identified. Effectiveness: Clinically acceptable. Plan: Refer to "POC". UDS:  Summary  Date Value Ref Range Status  01/24/2021 Note  Final    Comment:    ==================================================================== [employer] Select 13 (MW) ==================================================================== Specimen Alert Note: Urinary creatinine is low; ability to detect some drugs may be compromised. Interpret results with caution. (Creatinine) ==================================================================== Test                             Result       Flag       Units  Drug Present and Declared for Prescription Verification   7-aminoclonazepam              287          EXPECTED   ng/mg creat    7-aminoclonazepam is an expected metabolite of clonazepam. Source of    clonazepam is a scheduled prescription medication.    Tramadol                       >  29937       EXPECTED   ng/mg creat   O-Desmethyltramadol            21013        EXPECTED   ng/mg creat   N-Desmethyltramadol            7287         EXPECTED   ng/mg creat    Source of tramadol is a prescription medication. O-desmethyltramadol    and N-desmethyltramadol are expected metabolites of  tramadol.  ==================================================================== Test                      Result    Flag   Units      Ref Range   Creatinine              15        LL     mg/dL      >=20 ==================================================================== Declared Medications:  The flagging and interpretation on this report are based on the  following declared medications.  Unexpected results may arise from  inaccuracies in the declared medications.   **Note: The testing scope of this panel includes these medications:   Clonazepam (Klonopin)  Tramadol (Ultram)   **Note: The testing scope of this panel does not include the  following reported medications:   Acetaminophen (Tylenol)  Amlodipine  Cranberry  Furosemide  Latanoprost (Xalatan)  Levothyroxine  Lisinopril  Melatonin  Metoprolol  Omeprazole  Sulfamethoxazole (Bactrim)  Tamsulosin (Flomax)  Trazodone  Trimethoprim (Bactrim)  Vitamin D2 ==================================================================== For clinical consultation, please call [phone]. ====================================================================      Laboratory Chemistry Profile   Renal Lab Results  Component Value Date   BUN 8 03/07/2021   CREATININE 0.75 01/75/1025   BCR NOT APPLICABLE 85/27/7824   GFRAA 76 05/16/2020   GFRNONAA 66 05/16/2020    Hepatic Lab Results  Component Value Date   AST 10 03/07/2021   ALT 7 03/07/2021   ALBUMIN 3.6 11/22/2016   ALKPHOS 59 11/22/2016   LIPASE 21 06/14/2016    Electrolytes Lab Results  Component Value Date   NA 135 03/07/2021   K 3.9 03/07/2021   CL 95 (L) 03/07/2021   CALCIUM 9.6 03/07/2021   MG 2.1 01/14/2014    Bone No results found for: VD25OH, VD125OH2TOT, MP5361WE3, XV4008QP6, 25OHVITD1, 25OHVITD2, 25OHVITD3, TESTOFREE, TESTOSTERONE  Inflammation (CRP: Acute Phase) (ESR: Chronic Phase) No results found for: CRP, ESRSEDRATE, LATICACIDVEN        Note: Above Lab results reviewed.  Imaging  DG PAIN CLINIC C-ARM 1-60 MIN NO REPORT Fluoro was used, but no Radiologist interpretation will be provided.  Please refer to "NOTES" tab for provider progress note.  Assessment  The primary encounter diagnosis was Chronic low back pain (1ry area of Pain) (Bilateral) (R>L) w/o sciatica. Diagnoses of Chronic lumbar radicular pain (Right), DDD (degenerative disc disease), thoracolumbar, Lumbar foraminal stenosis, Lumbar lateral recess stenosis, Spinal stenosis of lumbar region with neurogenic claudication, T12 compression fracture, sequela, and Chronic lower extremity pain (3ry area of Pain) (Right) were also pertinent to this visit.  Plan of Care  Problem-specific:  No problem-specific Assessment & Plan notes found for this encounter.  Brittney Simpson has a current medication list which includes the following long-term medication(s): amlodipine, clonazepam, furosemide, levothyroxine, lisinopril, metoprolol succinate, omeprazole, tramadol, and trazodone.  Pharmacotherapy (Medications Ordered): No orders of the defined types were placed in this encounter.  Orders:  No orders of the defined  types were placed in this encounter.  Follow-up plan:   No follow-ups on file.     Interventional Therapies  Risk  Complexity Considerations:   Estimated body mass index is 20.19 kg/m as calculated from the following:   Height as of this encounter: _0  (1.6 m).   Weight as of this encounter: 114 lb (51.7 kg). Note: Hard of hearing   Planned  Pending:   Pending further evaluation   Under consideration:   Possible bilateral lumbar facet RFA. (Offerred several times. Pt. chooses to hold.) Diagnostic right hip joint injection  Diagnostic right L3 TFESI  Diagnostic right trochanteric bursa injection  Diagnostic right SI joint block  Diagnostic midline caudal ESI (for the tailbone pain)   Completed:   Palliative right T12-L1 LESI x3  (11/28/2020)  Therapeutic right L2-3 LESI x1 (03/07/2020)  Diagnostic/therapeutic bilateral SI joint injection x1 (02/10/2020)  Palliative right lumbar facet block x13 (01/04/2020)  Palliative left lumbar facet block x11 (01/04/2020)    Therapeutic  Palliative (PRN) options:   Palliative left T12-L1 LESI #4  Palliative right lumbar facet block #14  Palliative left lumbar facet block #12         Recent Visits Date Type Provider Dept  04/24/21 Procedure visit Milinda Pointer, MD [facility]  04/18/21 Office Visit Milinda Pointer, MD [facility]  02/20/21 Telemedicine Milinda Pointer, MD [facility]  Showing recent visits within past 90 days and meeting all other requirements Today's Visits Date Type Provider Dept  05/10/21 Office Visit Milinda Pointer, MD [facility]  Showing today's visits and meeting all other requirements Future Appointments No visits were found meeting these conditions. Showing future appointments within next 90 days and meeting all other requirements I discussed the assessment and treatment plan with the patient. The patient was provided an opportunity to ask questions and all were answered. The patient agreed with the plan and demonstrated an understanding of the instructions.  Patient advised to call back or seek an in-person evaluation if the symptoms or condition worsens.  Duration of encounter: 12 minutes.  Note by: Gaspar Cola, MD Date: 05/10/2021; Time: 3:30 PM

## 2021-05-14 ENCOUNTER — Other Ambulatory Visit (INDEPENDENT_AMBULATORY_CARE_PROVIDER_SITE_OTHER): Payer: Self-pay | Admitting: Nurse Practitioner

## 2021-05-14 DIAGNOSIS — I714 Abdominal aortic aneurysm, without rupture, unspecified: Secondary | ICD-10-CM

## 2021-05-15 ENCOUNTER — Ambulatory Visit (INDEPENDENT_AMBULATORY_CARE_PROVIDER_SITE_OTHER): Payer: Medicare Other

## 2021-05-15 ENCOUNTER — Other Ambulatory Visit: Payer: Self-pay

## 2021-05-15 ENCOUNTER — Encounter (INDEPENDENT_AMBULATORY_CARE_PROVIDER_SITE_OTHER): Payer: Self-pay | Admitting: Vascular Surgery

## 2021-05-15 ENCOUNTER — Ambulatory Visit (INDEPENDENT_AMBULATORY_CARE_PROVIDER_SITE_OTHER): Payer: Medicare Other | Admitting: Vascular Surgery

## 2021-05-15 VITALS — BP 136/68 | HR 60 | Resp 16 | Wt 117.0 lb

## 2021-05-15 DIAGNOSIS — I714 Abdominal aortic aneurysm, without rupture, unspecified: Secondary | ICD-10-CM | POA: Diagnosis not present

## 2021-05-15 DIAGNOSIS — M533 Sacrococcygeal disorders, not elsewhere classified: Secondary | ICD-10-CM | POA: Diagnosis not present

## 2021-05-15 DIAGNOSIS — I10 Essential (primary) hypertension: Secondary | ICD-10-CM | POA: Diagnosis not present

## 2021-05-15 DIAGNOSIS — N1831 Chronic kidney disease, stage 3a: Secondary | ICD-10-CM

## 2021-05-15 DIAGNOSIS — N183 Chronic kidney disease, stage 3 unspecified: Secondary | ICD-10-CM

## 2021-05-15 DIAGNOSIS — G8929 Other chronic pain: Secondary | ICD-10-CM | POA: Diagnosis not present

## 2021-05-15 DIAGNOSIS — I129 Hypertensive chronic kidney disease with stage 1 through stage 4 chronic kidney disease, or unspecified chronic kidney disease: Secondary | ICD-10-CM | POA: Diagnosis not present

## 2021-05-15 DIAGNOSIS — I7143 Infrarenal abdominal aortic aneurysm, without rupture: Secondary | ICD-10-CM

## 2021-05-15 DIAGNOSIS — E782 Mixed hyperlipidemia: Secondary | ICD-10-CM | POA: Diagnosis not present

## 2021-05-15 NOTE — Assessment & Plan Note (Signed)
Duplex today demonstrates a stable 4.5 cm infrarenal abdominal aortic aneurysm without growth from previous studies.  No role for intervention at this size.  Recheck in 6 months.

## 2021-05-15 NOTE — Progress Notes (Signed)
MRN : [id number]  Brittney Simpson is a 87 y.o. (May 22, 1928) female who presents with chief complaint of  Chief Complaint  Patient presents with   Follow-up    Ultrasound follow up  .  History of Present Illness: Patient returns today in follow up of her abdominal aortic aneurysm.  She is doing well.  Since her last visit, she has had cholecystectomy and did well from this.  No current aneurysm related symptoms. Specifically, the patient denies new back or abdominal pain, or signs of peripheral embolization. Duplex today demonstrates a stable 4.5 cm infrarenal abdominal aortic aneurysm without growth from previous studies  Current Outpatient Medications  Medication Sig Dispense Refill   acetaminophen (TYLENOL) 500 MG tablet Take 1,000 mg by mouth daily as needed for moderate pain or headache.     amLODipine (NORVASC) 5 MG tablet Take 1 tablet (5 mg total) by mouth daily with lunch. 90 tablet 3   amoxicillin (AMOXIL) 250 MG capsule Take 250 mg by mouth 3 (three) times daily.     Cholecalciferol (VITAMIN D3) 50 MCG (2000 UT) TABS Take 2,000 Units by mouth daily with lunch.     clonazePAM (KLONOPIN) 0.5 MG tablet Take 0.5 tablets (0.25 mg total) by mouth 2 (two) times daily. 30 tablet 2   Cranberry 500 MG CAPS Take 1,000 mg by mouth daily with supper.     furosemide (LASIX) 20 MG tablet TAKE 1 and 1/2 TABLETS BY MOUTH ONCE DAILY 45 tablet 5   ibuprofen (MOTRIN IB) 200 MG tablet Take 1 tablet (200 mg total) by mouth every 6 (six) hours as needed. 100 tablet 2   latanoprost (XALATAN) 0.005 % ophthalmic solution Place 1 drop into both eyes at bedtime.      levothyroxine (SYNTHROID) 112 MCG tablet Take 1 tablet (112 mcg total) by mouth daily before breakfast. 90 tablet 3   lisinopril (ZESTRIL) 40 MG tablet Take 1 tablet (40 mg total) by mouth daily. (Patient taking differently: Take 40 mg by mouth daily with lunch.) 90 tablet 3   Melatonin 10 MG TABS Take 10 mg by mouth at bedtime.      metoprolol succinate (TOPROL-XL) 100 MG 24 hr tablet TAKE 1 TABLET BY MOUTH ONCE DAILY WITH FOOD 90 tablet 0   omeprazole (PRILOSEC) 20 MG capsule Take 1 capsule (20 mg total) by mouth daily before breakfast. 90 capsule 3   sulfamethoxazole-trimethoprim (BACTRIM) 400-80 MG tablet Take 1 tablet by mouth every evening.     tamsulosin (FLOMAX) 0.4 MG CAPS capsule Take 1 capsule (0.4 mg total) by mouth daily. 30 capsule 11   traMADol (ULTRAM) 50 MG tablet Take 1 tablet (50 mg total) by mouth 3 (three) times daily. Each refill must last 30 days. 90 tablet 5   traZODone (DESYREL) 50 MG tablet Take 1 tablet (50 mg total) by mouth at bedtime. 90 tablet 3   No current facility-administered medications for this visit.    Past Medical History:  Diagnosis Date   Anxiety    Arthritis    Back ache    Hiatal hernia    Hyperlipidemia    Hypertension    Hypothyroid    Osteopenia    Thyroid disease    UTI (urinary tract infection)     Past Surgical History:  Procedure Laterality Date   CHOLECYSTECTOMY     HEMORRHOID SURGERY     HERNIA REPAIR     HERNIA REPAIR     KYPHOPLASTY N/A 01/23/2017   Procedure:  [id number];  Surgeon: Hessie Knows, MD;  Location: [facility] ORS;  Service: Orthopedics;  Laterality: N/A;   KYPHOPLASTY N/A 01/25/2020   Procedure: L4 KYPHOPLASTY;  Surgeon: Hessie Knows, MD;  Location: [facility] OR[facility];  Service: Orthopedics;  Laterality: N/A;    Social History             Tobacco Use   Smoking status: Never Smoker   Smokeless tobacco: Never Used  Substance Use Topics   Alcohol use: No      Alcohol/week: 0.0 standard drinks   Drug use: No               Family History  Problem Relation Age of Onset   Stroke Mother     Cancer Father    No bleeding or clotting disorders.  No aneurysms.     No Known Allergies   REVIEW OF SYSTEMS (Negative unless checked)   Constitutional: '[]' Weight loss  '[]' Fever  '[]' Chills Cardiac: '[]' Chest pain   '[]' Chest pressure   '[]' Palpitations    '[]' Shortness of breath when laying flat   '[]' Shortness of breath at rest   '[]' Shortness of breath with exertion. Vascular:  '[]' Pain in legs with walking   '[]' Pain in legs at rest   '[]' Pain in legs when laying flat   '[]' Claudication   '[]' Pain in feet when walking  '[]' Pain in feet at rest  '[]' Pain in feet when laying flat   '[]' History of DVT   '[]' Phlebitis   '[]' Swelling in legs   '[]' Varicose veins   '[]' Non-healing ulcers Pulmonary:   '[]' Uses home oxygen   '[]' Productive cough   '[]' Hemoptysis   '[]' Wheeze  '[]' COPD   '[]' Asthma Neurologic:  '[]' Dizziness  '[]' Blackouts   '[]' Seizures   '[]' History of stroke   '[]' History of TIA  '[]' Aphasia   '[]' Temporary blindness   '[]' Dysphagia   '[]' Weakness or numbness in arms   '[]' Weakness or numbness in legs Musculoskeletal:  '[x]' Arthritis   '[]' Joint swelling   '[]' Joint pain   '[x]' Low back pain Hematologic:  '[]' Easy bruising  '[]' Easy bleeding   '[]' Hypercoagulable state   '[]' Anemic  '[]' Hepatitis Gastrointestinal:  '[]' Blood in stool   '[]' Vomiting blood  '[]' Gastroesophageal reflux/heartburn   '[]' Abdominal pain Genitourinary:  '[]' Chronic kidney disease   '[]' Difficult urination  '[x]' Frequent urination  '[]' Burning with urination   '[]' Hematuria Skin:  '[]' Rashes   '[]' Ulcers   '[]' Wounds Psychological:  '[x]' History of anxiety   '[]'  History of major depression     Physical Examination  BP 136/68 (BP Location: Right Arm)   Pulse 60   Resp 16   Wt 117 lb (53.1 kg)   BMI 20.08 kg/m  Gen:  WD/WN, NAD. Appears younger than stated age. Head: [place]/AT, No temporalis wasting. Ear/Nose/Throat: Hearing diminished, nares w/o erythema or drainage Eyes: Conjunctiva clear. Sclera non-icteric Neck: Supple.  Trachea midline Pulmonary:  Good air movement, no use of accessory muscles.  Cardiac: RRR, no JVD Vascular:  Vessel Right Left  Radial Palpable Palpable           Musculoskeletal: M/S 5/5 throughout.  No deformity or atrophy. Walks with a cane. No edema. Neurologic: Sensation grossly intact in extremities.  Symmetrical.  Speech is  fluent.  Psychiatric: Judgment intact, Mood & affect appropriate for pt's clinical situation. Dermatologic: No rashes or ulcers noted.  No cellulitis or open wounds.      Labs Recent Results (from the past 2160 hour(s))  COMPLETE METABOLIC PANEL WITH GFR     Status: Abnormal   Collection Time: 03/07/21 11:17 AM  Result Value Ref Range  Glucose, Bld 97 65 - 99 mg/dL    Comment: .            Fasting reference interval .    BUN 8 7 - 25 mg/dL   Creat 0.75 0.60 - 0.95 mg/dL   eGFR 75 > OR = 60 mL/min/1.52m    Comment: The eGFR is based on the CKD-EPI 2021 equation. To calculate  the new eGFR from a previous Creatinine or Cystatin C result, go to https://www.kidney.org/professionals/ kdoqi/gfr%5Fcalculator    BUN/Creatinine Ratio NOT APPLICABLE 6 - 22 (calc)   Sodium 135 135 - 146 mmol/L   Potassium 3.9 3.5 - 5.3 mmol/L   Chloride 95 (L) 98 - 110 mmol/L   CO2 31 20 - 32 mmol/L   Calcium 9.6 8.6 - 10.4 mg/dL   Total Protein 6.7 6.1 - 8.1 g/dL   Albumin 4.0 3.6 - 5.1 g/dL   Globulin 2.7 1.9 - 3.7 g/dL (calc)   AG Ratio 1.5 1.0 - 2.5 (calc)   Total Bilirubin 0.3 0.2 - 1.2 mg/dL   Alkaline phosphatase (APISO) 79 37 - 153 U/L   AST 10 10 - 35 U/L   ALT 7 6 - 29 U/L  CBC with Differential/Platelet     Status: Abnormal   Collection Time: 03/07/21 11:17 AM  Result Value Ref Range   WBC 5.3 3.8 - 10.8 Thousand/uL   RBC 4.63 3.80 - 5.10 Million/uL   Hemoglobin 11.3 (L) 11.7 - 15.5 g/dL   HCT 37.0 35.0 - 45.0 %   MCV 79.9 (L) 80.0 - 100.0 fL   MCH 24.4 (L) 27.0 - 33.0 pg   MCHC 30.5 (L) 32.0 - 36.0 g/dL   RDW 14.5 11.0 - 15.0 %   Platelets 238 140 - 400 Thousand/uL   MPV 9.5 7.5 - 12.5 fL   Neutro Abs 2,984 1,500 - 7,800 cells/uL   Lymphs Abs 1,712 850 - 3,900 cells/uL   Absolute Monocytes 583 200 - 950 cells/uL   Eosinophils Absolute 11 (L) 15 - 500 cells/uL   Basophils Absolute 11 0 - 200 cells/uL   Neutrophils Relative % 56.3 %   Total Lymphocyte 32.3 %   Monocytes  Relative 11.0 %   Eosinophils Relative 0.2 %   Basophils Relative 0.2 %  Urinalysis, Routine w reflex microscopic     Status: Abnormal   Collection Time: 03/20/21  3:36 PM  Result Value Ref Range   Color, Urine YELLOW (A) YELLOW   APPearance CLEAR (A) CLEAR   Specific Gravity, Urine 1.011 1.005 - 1.030   pH 6.0 5.0 - 8.0   Glucose, UA NEGATIVE NEGATIVE mg/dL   Hgb urine dipstick NEGATIVE NEGATIVE   Bilirubin Urine NEGATIVE NEGATIVE   Ketones, ur NEGATIVE NEGATIVE mg/dL   Protein, ur NEGATIVE NEGATIVE mg/dL   Nitrite NEGATIVE NEGATIVE   Leukocytes,Ua NEGATIVE NEGATIVE    Comment: [facility]formed at [facility] [address], [place] [address] Surgical pathology     Status: None   Collection Time: 03/23/21  7:19 AM  Result Value Ref Range   SURGICAL PATHOLOGY      SURGICAL PATHOLOGY CASE: [id number]: DLynnell GrainSurgical Pathology Report     Specimen Submitted: A. Gallbladder  Clinical History: Biliary calculus      DIAGNOSIS: A. GALLBLADDER, CHOLECYSTECTOMY: - CHRONIC CHOLECYSTITIS WITH CHOLELITHIASIS. - NEGATIVE FOR MALIGNANCY.   GROSS DESCRIPTION: A. Labeled: Gallbladder Received: Formalin Collection time: 8:30 AM on 03/23/2021 Placed into formalin time: 8:35 AM on 03/23/2021 Size  of specimen: 9.5 x 3.2 x 2.2 cm Specimen integrity: Intact External surface: The serosa is green-pink, smooth, and glistening with a roughened hepatic bed. Wall thickness: Range from 0.1-0.3 cm Mucosa: The mucosa is tan-green and velvety.  Cholesterolosis is absent. Cystic duct: The cystic duct is 1.7 x 0.7 x 0.5 cm.  The duct is patent and grossly unremarkable. Bile present: There is green-tan viscous bile. Stones present: There are multiple black, firm, lobulated stones and stone fragments, 1.8 x 1.7 x 0.7 cm in  aggregate. Other findings: None grossly identified.  Block summary: 1 - cystic duct resection margin, en face and inked blue,  with representative wall  RB 03/23/2021  Final Diagnosis performed by Betsy Pries, MD.   Electronically signed 03/26/2021 9:36:31AM The electronic signature indicates that the named Attending Pathologist has evaluated the specimen Technical component performed at [facility], [address], [place], [place] [address] Lab: [phone] Dir: Rush Farmer, MD, MMM  Professional component performed at [facility], [facility], [place], [place], [place] [address] Lab: [phone] Dir: Dellia Nims. Rubinas, MD   Urinalysis, Complete     Status: None   Collection Time: 04/30/21  9:45 AM  Result Value Ref Range   Specific Gravity, UA 1.015 1.005 - 1.030   pH, UA 7.0 5.0 - 7.5   Color, UA Yellow Yellow   Appearance Ur Clear Clear   Leukocytes,UA Negative Negative   Protein,UA Negative Negative/Trace   Glucose, UA Negative Negative   Ketones, UA Negative Negative   RBC, UA Negative Negative   Bilirubin, UA Negative Negative   Urobilinogen, Ur 0.2 0.2 - 1.0 mg/dL   Nitrite, UA Negative Negative   Microscopic Examination See below:   Microscopic Examination     Status: None   Collection Time: 04/30/21  9:45 AM   Urine  Result Value Ref Range   WBC, UA 0-5 0 - 5 /hpf   RBC None seen 0 - 2 /hpf   Epithelial Cells (non renal) 0-10 0 - 10 /hpf   Bacteria, UA None seen None seen/Few  CULTURE, URINE COMPREHENSIVE     Status: None   Collection Time: 04/30/21 10:55 AM   Specimen: Urine   UR  Result Value Ref Range   Urine Culture, Comprehensive Final report    Organism ID, Bacteria Comment     Comment: Mixed urogenital flora 10,000-25,000 colony forming units per mL     Radiology DG PAIN CLINIC C-ARM 1-60 MIN NO REPORT  Result Date: 04/24/2021 Fluoro was used, but no Radiologist interpretation will be provided. Please refer to "NOTES" tab for provider progress note.   Assessment/Plan Essential hypertension blood pressure control important in reducing the  progression of atherosclerotic disease and aneurysmal degeneration. On appropriate oral medications.     Hyperlipidemia lipid control important in reducing the progression of atherosclerotic disease. Continue statin therapy     Chronic low back pain (Bilateral) (R>L) This not likely from her aneurysm   CKD (chronic kidney disease) stage 3, GFR 30-59 ml/min Sees nephrology. We will try to avoid any contrast administration unless the aneurysm is reaching a threshold size for consideration for repair.   AAA (abdominal aortic aneurysm) without rupture (HCC) Duplex today demonstrates a stable 4.5 cm infrarenal abdominal aortic aneurysm without growth from previous studies.  No role for intervention at this size.  Recheck in 6 months.    Leotis Pain, MD  05/15/2021 9:47 AM    This note was created with Dragon medical transcription system.  Any errors from dictation are purely unintentional

## 2021-05-18 ENCOUNTER — Other Ambulatory Visit: Payer: Self-pay | Admitting: Family Medicine

## 2021-05-18 DIAGNOSIS — F419 Anxiety disorder, unspecified: Secondary | ICD-10-CM

## 2021-05-18 DIAGNOSIS — N183 Chronic kidney disease, stage 3 unspecified: Secondary | ICD-10-CM

## 2021-05-18 DIAGNOSIS — I129 Hypertensive chronic kidney disease with stage 1 through stage 4 chronic kidney disease, or unspecified chronic kidney disease: Secondary | ICD-10-CM
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
## 2021-05-24 ENCOUNTER — Other Ambulatory Visit: Payer: Self-pay | Admitting: Family Medicine

## 2021-05-24 ENCOUNTER — Telehealth: Payer: Self-pay

## 2021-05-24 DIAGNOSIS — F5101 Primary insomnia: Secondary | ICD-10-CM

## 2021-05-24 NOTE — Telephone Encounter (Signed)
Copied from [place] [id number]. Topic: General - Other >> May 24, 2021  4:29 PM Pawlus, Brayton Layman A wrote: Reason for CRM: Pt wanted to make sure she was not getting the flu shot too early, please advise if the pt should go ahead with the flu shot on 11/2.   I informed her that it is not to early to come in to get her influenza vaccine.
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
## 2021-05-29 MED ORDER — TRAZODONE HCL 50 MG PO TABS: 50.0000 mg | ORAL_TABLET | Freq: Every day | ORAL | 3 refills | Status: DC

## 2021-05-29 NOTE — Addendum Note (Signed)
Addended by: Matilde Sprang on: 05/29/2021 12:02 PM   Modules accepted: Orders

## 2021-05-29 NOTE — Telephone Encounter (Signed)
Pt didn't pick up last refill in time and it was expired so pharmacy needs a refill for Trazodone/ please advise
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
## 2021-05-30 ENCOUNTER — Ambulatory Visit (INDEPENDENT_AMBULATORY_CARE_PROVIDER_SITE_OTHER): Payer: Medicare Other

## 2021-05-30 ENCOUNTER — Other Ambulatory Visit: Payer: Self-pay

## 2021-05-30 DIAGNOSIS — Z23 Encounter for immunization: Secondary | ICD-10-CM | POA: Diagnosis not present

## 2021-06-06 DIAGNOSIS — N183 Chronic kidney disease, stage 3 unspecified: Secondary | ICD-10-CM | POA: Diagnosis not present

## 2021-06-06 DIAGNOSIS — N390 Urinary tract infection, site not specified: Secondary | ICD-10-CM | POA: Diagnosis not present

## 2021-06-06 DIAGNOSIS — E871 Hypo-osmolality and hyponatremia: Secondary | ICD-10-CM | POA: Diagnosis not present

## 2021-06-06 DIAGNOSIS — I10 Essential (primary) hypertension: Secondary | ICD-10-CM | POA: Diagnosis not present

## 2021-07-03 NOTE — Progress Notes (Signed)
PROVIDER NOTE: Information contained herein reflects review and annotations entered in association with encounter. Interpretation of such information and data should be left to medically-trained personnel. Information provided to patient can be located elsewhere in the medical record under "Patient Instructions". Document created using STT-dictation technology, any transcriptional errors that may result from process are unintentional.    Patient: Brittney Simpson  Service Category: E/M  Provider: Gaspar Cola, MD  DOB: 04/25/1928  DOS: 07/04/2021  Specialty: Interventional Pain Management  MRN: [id number]  Setting: Ambulatory outpatient  PCP: Olin Hauser, DO  Type: Established Patient    Referring Provider: Nobie Putnam *  Location: Office  Delivery: Face-to-face     HPI  [name], a 87 y.o. year old female, is here today because of her Chronic bilateral low back pain without sciatica [M54.50, G89.29]. [name] primary complain today is Back Pain (Lumbar right ) Last encounter: My last encounter with her was on 05/10/2021. Pertinent problems: [name] has Lumbar facet syndrome (Bilateral) (R>L); Chronic low back pain (1ry area of Pain) (Bilateral) (R>L) w/o sciatica; Spondylolisthesis of lumbosacral region (L2-3 and L5-S1); Lumbar spinal stenosis (9 mm at L3-4); Trochanteric bursitis (Right); Chronic hip pain (2ry area of Pain) (Right); Osteoarthritis of hip (Right); Chronic sacroiliac joint pain (Bilateral) (R>L); Chronic lumbar radicular pain (Right); Chronic lower extremity pain (3ry area of Pain) (Right); Myofascial pain; Spondylosis without myelopathy or radiculopathy, lumbosacral region; Ankle edema, bilateral; Abnormal MRI, lumbar spine (01/10/2017); T12 compression fracture, sequela; Lumbar facet hypertrophy; Lumbar lateral recess stenosis; Lumbar foraminal stenosis; Non-traumatic compression fracture of T7 thoracic vertebra, sequela; Chronic  pain syndrome; DDD (degenerative disc disease), lumbosacral; Edema of lower extremity; Somatic dysfunction of sacroiliac joints (Bilateral); Other spondylosis, sacral and sacrococcygeal region; Spasm of back muscles; and DDD (degenerative disc disease), thoracolumbar on their pertinent problem list. Pain Assessment: Severity of Chronic pain is reported as a 8 /10. Location: Back Lower, Right/into right hip. Onset: More than a month ago. Quality: Discomfort, Constant. Timing: Constant. Modifying factor(s): ice. Vitals:  height is '5\' 4"'  (1.626 m) and weight is 117 lb (53.1 kg). Her temporal temperature is 97.1 F (36.2 C) (abnormal). Her blood pressure is 149/62 (abnormal) and her pulse is 57 (abnormal). Her respiration is 16 and oxygen saturation is 94%.   Reason for encounter: worsening of previously known (established) problem.  Today the patient returns accompanied by her daughter with recurrence of the right-sided low back pain.  She denies any lower extremity pain.  Today we had a long conversation regarding when we should use a Lumbar epidural steroid injection versus the lumbar facets.  They indicated having the impression that the lumbar facet blocks tend to last longer.  I pointed out that indeed that was the case until she had her T12 compression fracture after which she returned having right-sided low back pain and when we did the lumbar facets, they did not last 30 days and she had to come back at which time we did not did the T12-L1 lumbar epidural steroid injection which did control her low back pain.  This was likely due to irritation of the T12-L2 nerve roots on the right side as a consequence of the chemicals released by the T12 fracture.  From that point on, we continue doing the lumbar epidural steroid injections since they all provide her with good relief of the pain.  The question at this point is not whether or not she gets good relief with the injections since  she always gets relief from  both of them, the question is which 1 tends to provide her with longer lasting benefit.  I did explain to them that that he is dependent on what is responsible for the pain in that area, at that time.  At this point, there is no question in my mind that she has lumbar facet pain and she also probably has a contribution of low back pain from the upper lumbar region however, the question reality is which 1 is contributing more to her current pain.  At this point, we will go ahead and plan on bringing her back for the Lumbar facet block since today I have talked to them about the possibility of radiofrequency ablation and in order to see if this would be effective in controlling her pain long-term, we would need to do a diagnostic lumbar facet block at this time.  She understood the plan and accepted.  Pharmacotherapy Assessment  Analgesic: Tramadol 50 mg, 1 tab PO q 8 hrs (150 mg/day of tramadol) MME/day: 15 mg/day.   Monitoring: [place] PMP: PDMP reviewed during this encounter.       Pharmacotherapy: No side-effects or adverse reactions reported. Compliance: No problems identified. Effectiveness: Clinically acceptable.  Janett Billow, RN  07/04/2021 11:28 AM  Sign when Signing Visit Safety precautions to be maintained throughout the outpatient stay will include: orient to surroundings, keep bed in low position, maintain call bell within reach at all times, provide assistance with transfer out of bed and ambulation.     UDS:  Summary  Date Value Ref Range Status  01/24/2021 Note  Final    Comment:    ==================================================================== [employer] 13 (MW) ==================================================================== Specimen Alert Note: Urinary creatinine is low; ability to detect some drugs may be compromised. Interpret results with caution. (Creatinine) ==================================================================== Test                              Result       Flag       Units  Drug Present and Declared for Prescription Verification   7-aminoclonazepam              287          EXPECTED   ng/mg creat    7-aminoclonazepam is an expected metabolite of clonazepam. Source of    clonazepam is a scheduled prescription medication.    Tramadol                       >33333       EXPECTED   ng/mg creat   O-Desmethyltramadol            21013        EXPECTED   ng/mg creat   N-Desmethyltramadol            7287         EXPECTED   ng/mg creat    Source of tramadol is a prescription medication. O-desmethyltramadol    and N-desmethyltramadol are expected metabolites of tramadol.  ==================================================================== Test                      Result    Flag   Units      Ref Range   Creatinine              15        LL  mg/dL      >=20 ==================================================================== Declared Medications:  The flagging and interpretation on this report are based on the  following declared medications.  Unexpected results may arise from  inaccuracies in the declared medications.   **Note: The testing scope of this panel includes these medications:   Clonazepam (Klonopin)  Tramadol (Ultram)   **Note: The testing scope of this panel does not include the  following reported medications:   Acetaminophen (Tylenol)  Amlodipine  Cranberry  Furosemide  Latanoprost (Xalatan)  Levothyroxine  Lisinopril  Melatonin  Metoprolol  Omeprazole  Sulfamethoxazole (Bactrim)  Tamsulosin (Flomax)  Trazodone  Trimethoprim (Bactrim)  Vitamin D2 ==================================================================== For clinical consultation, please call [phone]. ====================================================================      ROS  Constitutional: Denies any fever or chills Gastrointestinal: No reported hemesis, hematochezia, vomiting, or acute GI distress Musculoskeletal:  Denies any acute onset joint swelling, redness, loss of ROM, or weakness Neurological: No reported episodes of acute onset apraxia, aphasia, dysarthria, agnosia, amnesia, paralysis, loss of coordination, or loss of consciousness  Medication Review  Cranberry, Melatonin, Vitamin D3, acetaminophen, amLODipine, clonazePAM, furosemide, ibuprofen, latanoprost, levothyroxine, lisinopril, metoprolol succinate, omeprazole, sulfamethoxazole-trimethoprim, tamsulosin, traMADol, and traZODone  History Review  Allergy: [name] has No Known Allergies. Drug: [name]  reports no history of drug use. Alcohol:  reports no history of alcohol use. Tobacco:  reports that she has never smoked. She has never used smokeless tobacco. Social: [name]  reports that she has never smoked. She has never used smokeless tobacco. She reports that she does not drink alcohol and does not use drugs. Medical:  has a past medical history of Anxiety, Arthritis, Back ache, Hiatal hernia, Hyperlipidemia, Hypertension, Hypothyroid, Osteopenia, Thyroid disease, and UTI (urinary tract infection). Surgical: [name]  has a past surgical history that includes Hernia repair; Hernia repair; Hemorrhoid surgery; Kyphoplasty (N/A, 01/23/2017); Kyphoplasty (N/A, 01/25/2020); and Cholecystectomy. Family: family history includes Cancer in her father; Stroke in her mother.  Laboratory Chemistry Profile   Renal Lab Results  Component Value Date   BUN 8 03/07/2021   CREATININE 0.75 53/66/4403   BCR NOT APPLICABLE 47/42/5956   GFRAA 76 05/16/2020   GFRNONAA 66 05/16/2020    Hepatic Lab Results  Component Value Date   AST 10 03/07/2021   ALT 7 03/07/2021   ALBUMIN 3.6 11/22/2016   ALKPHOS 59 11/22/2016   LIPASE 21 06/14/2016    Electrolytes Lab Results  Component Value Date   NA 135 03/07/2021   K 3.9 03/07/2021   CL 95 (L) 03/07/2021   CALCIUM 9.6 03/07/2021   MG 2.1 01/14/2014    Bone No results found for:  VD25OH, VD125OH2TOT, LO7564PP2, [id number], 25OHVITD1, 25OHVITD2, 25OHVITD3, TESTOFREE, TESTOSTERONE  Inflammation (CRP: Acute Phase) (ESR: Chronic Phase) No results found for: CRP, ESRSEDRATE, LATICACIDVEN       Note: Above Lab results reviewed.  Recent Imaging Review  VAS [place] AAA DUPLEX ABDOMINAL AORTA STUDY  Patient Name:  Brittney Simpson  Date of Exam:   05/15/2021 Medical Rec #: [id number]           Accession #:    [id number] Date of Birth: 1928-07-20           Patient Gender: F Patient Age:   28 years Exam Location:  [facility] Procedure:      VAS [place] AAA DUPLEX Referring Phys: Arna Medici BROWN  --------------------------------------------------------------------------------   Limitations: Air/bowel gas.    Comparison Study: 11/22/2020  Performing Technologist: Charlane Ferretti RT (R)(VS)  Examination Guidelines: A complete evaluation includes B-mode imaging, spectral Doppler, color Doppler, and power Doppler as needed of all accessible portions of each vessel. Bilateral testing is considered an integral part of a complete examination. Limited examinations for reoccurring indications may be performed as noted.    Abdominal Aorta Findings: +-----------+-------+----------+----------+--------+--------+--------+ Location   AP (cm)Trans (cm)PSV (cm/s)WaveformThrombusComments +-----------+-------+----------+----------+--------+--------+--------+ Proximal   3.42   4.35      34                                 +-----------+-------+----------+----------+--------+--------+--------+ Mid        3.90   4.46      125                                +-----------+-------+----------+----------+--------+--------+--------+ Distal     3.25   3.30      132                                +-----------+-------+----------+----------+--------+--------+--------+ RT CIA Prox1.3    1.3       159                                 +-----------+-------+----------+----------+--------+--------+--------+ LT CIA Prox1.3    1.2       124                                +-----------+-------+----------+----------+--------+--------+--------+    Summary: Abdominal Aorta: There is evidence of abnormal dilatation of the proximal, mid and distal Abdominal aorta. The largest aortic measurement is 4.5 cm. The largest aortic diameter remains essentially unchanged compared to prior exam. Previous diameter  measurement was 4.5 cm obtained on 11/22/2020.   *See table(s) above for measurements and observations.   Electronically signed by Leotis Pain MD on 05/22/2021 at 12:29:09 PM.    Final   Note: Reviewed        Physical Exam  General appearance: Well nourished, well developed, and well hydrated. In no apparent acute distress Mental status: Alert, oriented x 3 (person, place, & time)       Respiratory: No evidence of acute respiratory distress Eyes: PERLA Vitals: BP (!) 149/62 (BP Location: Right Arm, Patient Position: Sitting, Cuff Size: Normal)   Pulse (!) 57   Temp (!) 97.1 F (36.2 C) (Temporal)   Resp 16   Ht '5\' 4"'  (1.626 m)   Wt 117 lb (53.1 kg)   SpO2 94%   BMI 20.08 kg/m  BMI: Estimated body mass index is 20.08 kg/m as calculated from the following:   Height as of this encounter: '5\' 4"'  (1.626 m).   Weight as of this encounter: 117 lb (53.1 kg). Ideal: Ideal body weight: 54.7 kg (120 lb 9.5 oz)  Assessment   Status Diagnosis  Controlled Controlled Controlled 1. Chronic low back pain (1ry area of Pain) (Bilateral) (R>L) w/o sciatica   2. Lumbar facet syndrome (Bilateral) (R>L)   3. Lumbar facet hypertrophy   4. Chronic lower extremity pain (3ry area of Pain) (Right)   5. DDD (degenerative disc disease), lumbosacral   6. T12 compression fracture, sequela   7. Chronic pain syndrome      Updated  Problems: No problems updated.  Plan of Care  Problem-specific:  No problem-specific Assessment &  Plan notes found for this encounter.  [name] has a current medication list which includes the following long-term medication(s): amlodipine, clonazepam, furosemide, levothyroxine, lisinopril, metoprolol succinate, omeprazole, tramadol, and trazodone.  Pharmacotherapy (Medications Ordered): No orders of the defined types were placed in this encounter.  Orders:  Orders Placed This Encounter  Procedures   Lumbar Epidural Injection    Standing Status:   Standing    Number of Occurrences:   1    Standing Expiration Date:   10/02/2021    Scheduling Instructions:     Procedure: Interlaminar Lumbar Epidural Steroid injection (LESI)  T12-L1     Laterality: Right-sided     Sedation: None required.     Timeframe: ASAA    Order Specific Question:   Where will this procedure be performed?    Answer:   [facility]   LUMBAR FACET(MEDIAL BRANCH NERVE BLOCK) MBNB    Standing Status:   Future    Standing Expiration Date:   10/02/2021    Scheduling Instructions:     Procedure: Lumbar facet block (AKA.: Lumbosacral medial branch nerve block)     Side: Right-sided     Level: L3-4, L4-5, & L5-S1 Facets (L2, L3, L4, L5, & S1 Medial Branch Nerves)     Sedation: No Sedation.     Timeframe: ASAP    Order Specific Question:   Where will this procedure be performed?    Answer:   [facility]    Follow-up plan:   Return for (Clinic) procedure: (R) L-FCT BLK, (NS).     Interventional Therapies  Risk  Complexity Considerations:   Estimated body mass index is 20.19 kg/m as calculated from the following:   Height as of this encounter: '5\' 3"'  (1.6 m).   Weight as of this encounter: 114 lb (51.7 kg). Note: Hard of hearing   Planned  Pending:      Under consideration:   Diagnostic right hip joint injection  Diagnostic right trochanteric bursa injection  Diagnostic midline caudal ESI (for the tailbone pain)   Completed:   Palliative right T12-L1 LESI x5 (04/24/2021)   Therapeutic right L2-3 LESI x1 (03/07/2020)  Diagnostic/therapeutic bilateral SI joint injection x1 (02/10/2020)  Palliative right lumbar facet block x13 (01/04/2020)  Palliative left lumbar facet block x11 (01/04/2020)    Therapeutic  Palliative (PRN) options:   Palliative T12-L1 LESI     Recent Visits Date Type Provider Dept  05/10/21 Office Visit Milinda Pointer, MD [facility]  04/24/21 Procedure visit Milinda Pointer, MD [facility]  04/18/21 Office Visit Milinda Pointer, MD [facility]  Showing recent visits within past 90 days and meeting all other requirements Today's Visits Date Type Provider Dept  07/04/21 Office Visit Milinda Pointer, MD [facility]  Showing today's visits and meeting all other requirements Future Appointments Date Type Provider Dept  07/10/21 Appointment Milinda Pointer, MD [facility]  Showing future appointments within next 90 days and meeting all other requirements I discussed the assessment and treatment plan with the patient. The patient was provided an opportunity to ask questions and all were answered. The patient agreed with the plan and demonstrated an understanding of the instructions.  Patient advised to call back or seek an in-person evaluation if the symptoms or condition worsens.  Duration of encounter: 35 minutes.  Note by: Gaspar Cola, MD Date: 07/04/2021; Time:  12:01 PM

## 2021-07-04 ENCOUNTER — Encounter: Payer: Self-pay | Admitting: Pain Medicine

## 2021-07-04 ENCOUNTER — Ambulatory Visit: Payer: Medicare Other | Attending: Pain Medicine | Admitting: Pain Medicine

## 2021-07-04 ENCOUNTER — Other Ambulatory Visit: Payer: Self-pay

## 2021-07-04 VITALS — BP 149/62 | HR 57 | Temp 97.1°F | Resp 16 | Ht 64.0 in | Wt 117.0 lb

## 2021-07-04 DIAGNOSIS — M79604 Pain in right leg: Secondary | ICD-10-CM | POA: Insufficient documentation

## 2021-07-04 DIAGNOSIS — G8929 Other chronic pain: Secondary | ICD-10-CM | POA: Diagnosis not present

## 2021-07-04 DIAGNOSIS — G894 Chronic pain syndrome: Secondary | ICD-10-CM | POA: Diagnosis not present

## 2021-07-04 DIAGNOSIS — M545 Low back pain, unspecified: Secondary | ICD-10-CM | POA: Diagnosis not present

## 2021-07-04 DIAGNOSIS — S22080S Wedge compression fracture of T11-T12 vertebra, sequela: Secondary | ICD-10-CM | POA: Insufficient documentation

## 2021-07-04 DIAGNOSIS — M47816 Spondylosis without myelopathy or radiculopathy, lumbar region: Secondary | ICD-10-CM | POA: Diagnosis not present

## 2021-07-04 DIAGNOSIS — M5137 Other intervertebral disc degeneration, lumbosacral region: Secondary | ICD-10-CM | POA: Insufficient documentation

## 2021-07-04 NOTE — Patient Instructions (Addendum)
____________________________________________________________________________________________  Preparing for your procedure (without sedation)  Procedure appointments are limited to planned procedures: No Prescription Refills. No disability issues will be discussed. No medication changes will be discussed.  Instructions: Oral Intake: Do not eat or drink anything for at least 6 hours prior to your procedure. (Exception: Blood Pressure Medication. See below.) Transportation: Unless otherwise stated by your physician, you may drive yourself after the procedure. Blood Pressure Medicine: Do not forget to take your blood pressure medicine with a sip of water the morning of the procedure. If your Diastolic (lower reading)is above 100 mmHg, elective cases will be cancelled/rescheduled. Blood thinners: These will need to be stopped for procedures. Notify our staff if you are taking any blood thinners. Depending on which one you take, there will be specific instructions on how and when to stop it. Diabetics on insulin: Notify the staff so that you can be scheduled 1st case in the morning. If your diabetes requires high dose insulin, take only  of your normal insulin dose the morning of the procedure and notify the staff that you have done so. Preventing infections: Shower with an antibacterial soap the morning of your procedure.  Build-up your immune system: Take 1000 mg of Vitamin C with every meal (3 times a day) the day prior to your procedure. Antibiotics: Inform the staff if you have a condition or reason that requires you to take antibiotics before dental procedures. Pregnancy: If you are pregnant, call and cancel the procedure. Sickness: If you have a cold, fever, or any active infections, call and cancel the procedure. Arrival: You must be in the facility at least 30 minutes prior to your scheduled procedure. Children: Do not bring any children with you. Dress appropriately: Bring dark clothing  that you would not mind if they get stained. Valuables: Do not bring any jewelry or valuables.  Reasons to call and reschedule or cancel your procedure: (Following these recommendations will minimize the risk of a serious complication.) Surgeries: Avoid having procedures within 2 weeks of any surgery. (Avoid for 2 weeks before or after any surgery). Flu Shots: Avoid having procedures within 2 weeks of a flu shots or . (Avoid for 2 weeks before or after immunizations). Barium: Avoid having a procedure within 7-10 days after having had a radiological study involving the use of radiological contrast. (Myelograms, Barium swallow or enema study). Heart attacks: Avoid any elective procedures or surgeries for the initial 6 months after a "Myocardial Infarction" (Heart Attack). Blood thinners: It is imperative that you stop these medications before procedures. Let us know if you if you take any blood thinner.  Infection: Avoid procedures during or within two weeks of an infection (including chest colds or gastrointestinal problems). Symptoms associated with infections include: Localized redness, fever, chills, night sweats or profuse sweating, burning sensation when voiding, cough, congestion, stuffiness, runny nose, sore throat, diarrhea, nausea, vomiting, cold or Flu symptoms, recent or current infections. It is specially important if the infection is over the area that we intend to treat. Heart and lung problems: Symptoms that may suggest an active cardiopulmonary problem include: cough, chest pain, breathing difficulties or shortness of breath, dizziness, ankle swelling, uncontrolled high or unusually low blood pressure, and/or palpitations. If you are experiencing any of these symptoms, cancel your procedure and contact your primary care physician for an evaluation.  Remember:  Regular Business hours are:  Monday to Thursday 8:00 AM to 4:00 PM  Provider's Schedule: Francisco Naveira, MD:  Procedure  days: Tuesday and   Thursday 7:30 AM to 4:00 PM  Bilal Lateef, MD:  Procedure days: Monday and Wednesday 7:30 AM to 4:00 PM ____________________________________________________________________________________________  ____________________________________________________________________________________________  General Risks and Possible Complications  Patient Responsibilities: It is important that you read this as it is part of your informed consent. It is our duty to inform you of the risks and possible complications associated with treatments offered to you. It is your responsibility as a patient to read this and to ask questions about anything that is not clear or that you believe was not covered in this document.  Patient's Rights: You have the right to refuse treatment. You also have the right to change your mind, even after initially having agreed to have the treatment done. However, under this last option, if you wait until the last second to change your mind, you may be charged for the materials used up to that point.  Introduction: Medicine is not an exact science. Everything in Medicine, including the lack of treatment(s), carries the potential for danger, harm, or loss (which is by definition: Risk). In Medicine, a complication is a secondary problem, condition, or disease that can aggravate an already existing one. All treatments carry the risk of possible complications. The fact that a side effects or complications occurs, does not imply that the treatment was conducted incorrectly. It must be clearly understood that these can happen even when everything is done following the highest safety standards.  No treatment: You can choose not to proceed with the proposed treatment alternative. The "PRO(s)" would include: avoiding the risk of complications associated with the therapy. The "CON(s)" would include: not getting any of the treatment benefits. These benefits fall under one of three  categories: diagnostic; therapeutic; and/or palliative. Diagnostic benefits include: getting information which can ultimately lead to improvement of the disease or symptom(s). Therapeutic benefits are those associated with the successful treatment of the disease. Finally, palliative benefits are those related to the decrease of the primary symptoms, without necessarily curing the condition (example: decreasing the pain from a flare-up of a chronic condition, such as incurable terminal cancer).  General Risks and Complications: These are associated to most interventional treatments. They can occur alone, or in combination. They fall under one of the following six (6) categories: no benefit or worsening of symptoms; bleeding; infection; nerve damage; allergic reactions; and/or death. No benefits or worsening of symptoms: In Medicine there are no guarantees, only probabilities. No healthcare provider can ever guarantee that a medical treatment will work, they can only state the probability that it may. Furthermore, there is always the possibility that the condition may worsen, either directly, or indirectly, as a consequence of the treatment. Bleeding: This is more common if the patient is taking a blood thinner, either prescription or over the counter (example: Goody Powders, Fish oil, Aspirin, Garlic, etc.), or if suffering a condition associated with impaired coagulation (example: Hemophilia, cirrhosis of the liver, low platelet counts, etc.). However, even if you do not have one on these, it can still happen. If you have any of these conditions, or take one of these drugs, make sure to notify your treating physician. Infection: This is more common in patients with a compromised immune system, either due to disease (example: diabetes, cancer, human immunodeficiency virus [HIV], etc.), or due to medications or treatments (example: therapies used to treat cancer and rheumatological diseases). However, even if you  do not have one on these, it can still happen. If you have any of these conditions, or   take one of these drugs, make sure to notify your treating physician. Nerve Damage: This is more common when the treatment is an invasive one, but it can also happen with the use of medications, such as those used in the treatment of cancer. The damage can occur to small secondary nerves, or to large primary ones, such as those in the spinal cord and brain. This damage may be temporary or permanent and it may lead to impairments that can range from temporary numbness to permanent paralysis and/or brain death. Allergic Reactions: Any time a substance or material comes in contact with our body, there is the possibility of an allergic reaction. These can range from a mild skin rash (contact dermatitis) to a severe systemic reaction (anaphylactic reaction), which can result in death. Death: In general, any medical intervention can result in death, most of the time due to an unforeseen complication. ____________________________________________________________________________________________ Facet Blocks Patient Information  Description: The facets are joints in the spine between the vertebrae.  Like any joints in the body, facets can become irritated and painful.  Arthritis can also effect the facets.  By injecting steroids and local anesthetic in and around these joints, we can temporarily block the nerve supply to them.  Steroids act directly on irritated nerves and tissues to reduce selling and inflammation which often leads to decreased pain.  Facet blocks may be done anywhere along the spine from the neck to the low back depending upon the location of your pain.   After numbing the skin with local anesthetic (like Novocaine), a small needle is passed onto the facet joints under x-ray guidance.  You may experience a sensation of pressure while this is being done.  The entire block usually lasts about 15-25 minutes.    Conditions which may be treated by facet blocks:  Low back/buttock pain Neck/shoulder pain Certain types of headaches  Preparation for the injection:  Do not eat any solid food or dairy products within 8 hours of your appointment. You may drink clear liquid up to 3 hours before appointment.  Clear liquids include water, black coffee, juice or soda.  No milk or cream please. You may take your regular medication, including pain medications, with a sip of water before your appointment.  Diabetics should hold regular insulin (if taken separately) and take 1/2 normal NPH dose the morning of the procedure.  Carry some sugar containing items with you to your appointment. A driver must accompany you and be prepared to drive you home after your procedure. Bring all your current medications with you. An IV may be inserted and sedation may be given at the discretion of the physician. A blood pressure cuff, EKG and other monitors will often be applied during the procedure.  Some patients may need to have extra oxygen administered for a short period. You will be asked to provide medical information, including your allergies and medications, prior to the procedure.  We must know immediately if you are taking blood thinners (like Coumadin/Warfarin) or if you are allergic to IV iodine contrast (dye).  We must know if you could possible be pregnant.  Possible side-effects:  Bleeding from needle site Infection (rare, may require surgery) Nerve injury (rare) Numbness & tingling (temporary) Difficulty urinating (rare, temporary) Spinal headache (a headache worse with upright posture) Light-headedness (temporary) Pain at injection site (serveral days) Decreased blood pressure (rare, temporary) Weakness in arm/leg (temporary) Pressure sensation in back/neck (temporary)   Call if you experience:  Fever/chills associated with headache   or increased back/neck pain Headache worsened by an upright  position New onset, weakness or numbness of an extremity below the injection site Hives or difficulty breathing (go to the emergency room) Inflammation or drainage at the injection site(s) Severe back/neck pain greater than usual New symptoms which are concerning to you  Please note:  Although the local anesthetic injected can often make your back or neck feel good for several hours after the injection, the pain will likely return. It takes 3-7 days for steroids to work.  You may not notice any pain relief for at least one week.  If effective, we will often do a series of 2-3 injections spaced 3-6 weeks apart to maximally decrease your pain.  After the initial series, you may be a candidate for a more permanent nerve block of the facets.  If you have any questions, please call [phone] [facility] 

## 2021-07-04 NOTE — Progress Notes (Signed)
Safety precautions to be maintained throughout the outpatient stay will include: orient to surroundings, keep bed in low position, maintain call bell within reach at all times, provide assistance with transfer out of bed and ambulation.  

## 2021-07-10 ENCOUNTER — Other Ambulatory Visit: Payer: Self-pay

## 2021-07-10 ENCOUNTER — Ambulatory Visit
Admission: RE | Admit: 2021-07-10 | Discharge: 2021-07-10 | Disposition: A | Discharge status: Home / Self Care | Payer: Medicare Other | Source: Ambulatory Visit | Attending: Pain Medicine | Admitting: Pain Medicine

## 2021-07-10 ENCOUNTER — Encounter: Payer: Self-pay | Admitting: Pain Medicine

## 2021-07-10 ENCOUNTER — Ambulatory Visit (HOSPITAL_BASED_OUTPATIENT_CLINIC_OR_DEPARTMENT_OTHER): Payer: Medicare Other | Admitting: Pain Medicine

## 2021-07-10 VITALS — BP 176/69 | HR 63 | Temp 96.7°F | Resp 14 | Ht 64.0 in | Wt 117.0 lb

## 2021-07-10 DIAGNOSIS — M47817 Spondylosis without myelopathy or radiculopathy, lumbosacral region: Secondary | ICD-10-CM | POA: Insufficient documentation

## 2021-07-10 DIAGNOSIS — M47816 Spondylosis without myelopathy or radiculopathy, lumbar region: Secondary | ICD-10-CM

## 2021-07-10 DIAGNOSIS — G8929 Other chronic pain: Secondary | ICD-10-CM

## 2021-07-10 DIAGNOSIS — M4317 Spondylolisthesis, lumbosacral region: Secondary | ICD-10-CM

## 2021-07-10 DIAGNOSIS — M545 Low back pain, unspecified: Secondary | ICD-10-CM | POA: Diagnosis not present

## 2021-07-10 DIAGNOSIS — M5137 Other intervertebral disc degeneration, lumbosacral region: Secondary | ICD-10-CM | POA: Diagnosis not present

## 2021-07-10 MED ORDER — ROPIVACAINE HCL 2 MG/ML IJ SOLN
INTRAMUSCULAR | Status: AC
  Filled 2021-07-10: qty 20

## 2021-07-10 MED ORDER — PENTAFLUOROPROP-TETRAFLUOROETH EX AERO
INHALATION_SPRAY | Freq: Once | CUTANEOUS | Status: DC
  Filled 2021-07-10: qty 116

## 2021-07-10 MED ORDER — ROPIVACAINE HCL 2 MG/ML IJ SOLN
9.0000 mL | Freq: Once | INTRAMUSCULAR | Status: AC
  Administered 2021-07-10: 9 mL via PERINEURAL

## 2021-07-10 MED ORDER — TRIAMCINOLONE ACETONIDE 40 MG/ML IJ SUSP
INTRAMUSCULAR | Status: AC
  Filled 2021-07-10: qty 1

## 2021-07-10 MED ORDER — LIDOCAINE HCL 2 % IJ SOLN
INTRAMUSCULAR | Status: AC
  Filled 2021-07-10: qty 20

## 2021-07-10 MED ORDER — TRIAMCINOLONE ACETONIDE 40 MG/ML IJ SUSP
40.0000 mg | Freq: Once | INTRAMUSCULAR | Status: AC
  Administered 2021-07-10: 40 mg

## 2021-07-10 MED ORDER — LIDOCAINE HCL 2 % IJ SOLN
20.0000 mL | Freq: Once | INTRAMUSCULAR | Status: AC
  Administered 2021-07-10: 200 mg

## 2021-07-10 NOTE — Progress Notes (Signed)
Safety precautions to be maintained throughout the outpatient stay will include: orient to surroundings, keep bed in low position, maintain call bell within reach at all times, provide assistance with transfer out of bed and ambulation.  

## 2021-07-10 NOTE — Patient Instructions (Addendum)
____________________________________________________________________________________________  Post-Procedure Discharge Instructions  Instructions: Apply ice:  Purpose: This will minimize any swelling and discomfort after procedure.  When: Day of procedure, as soon as you get home. How: Fill a plastic sandwich bag with crushed ice. Cover it with a small towel and apply to injection site. How long: (15 min on, 15 min off) Apply for 15 minutes then remove x 15 minutes.  Repeat sequence on day of procedure, until you go to bed. Apply heat:  Purpose: To treat any soreness and discomfort from the procedure. When: Starting the next day after the procedure. How: Apply heat to procedure site starting the day following the procedure. How long: May continue to repeat daily, until discomfort goes away. Food intake: Start with clear liquids (like water) and advance to regular food, as tolerated.  Physical activities: Keep activities to a minimum for the first 8 hours after the procedure. After that, then as tolerated. Driving: If you have received any sedation, be responsible and do not drive. You are not allowed to drive for 24 hours after having sedation. Blood thinner: (Applies only to those taking blood thinners) You may restart your blood thinner 6 hours after your procedure. Insulin: (Applies only to Diabetic patients taking insulin) As soon as you can eat, you may resume your normal dosing schedule. Infection prevention: Keep procedure site clean and dry. Shower daily and clean area with soap and water. Post-procedure Pain Diary: Extremely important that this be done correctly and accurately. Recorded information will be used to determine the next step in treatment. For the purpose of accuracy, follow these rules: Evaluate only the area treated. Do not report or include pain from an untreated area. For the purpose of this evaluation, ignore all other areas of pain, except for the treated area. After  your procedure, avoid taking a long nap and attempting to complete the pain diary after you wake up. Instead, set your alarm clock to go off every hour, on the hour, for the initial 8 hours after the procedure. Document the duration of the numbing medicine, and the relief you are getting from it. Do not go to sleep and attempt to complete it later. It will not be accurate. If you received sedation, it is likely that you were given a medication that may cause amnesia. Because of this, completing the diary at a later time may cause the information to be inaccurate. This information is needed to plan your care. Follow-up appointment: Keep your post-procedure follow-up evaluation appointment after the procedure (usually 2 weeks for most procedures, 6 weeks for radiofrequencies). DO NOT FORGET to bring you pain diary with you.   Expect: (What should I expect to see with my procedure?) From numbing medicine (AKA: Local Anesthetics): Numbness or decrease in pain. You may also experience some weakness, which if present, could last for the duration of the local anesthetic. Onset: Full effect within 15 minutes of injected. Duration: It will depend on the type of local anesthetic used. On the average, 1 to 8 hours.  From steroids (Applies only if steroids were used): Decrease in swelling or inflammation. Once inflammation is improved, relief of the pain will follow. Onset of benefits: Depends on the amount of swelling present. The more swelling, the longer it will take for the benefits to be seen. In some cases, up to 10 days. Duration: Steroids will stay in the system x 2 weeks. Duration of benefits will depend on multiple posibilities including persistent irritating factors. Side-effects: If present, they  may typically last 2 weeks (the duration of the steroids). Frequent: Cramps (if they occur, drink Gatorade and take over-the-counter Magnesium 450-500 mg once to twice a day); water retention with temporary  weight gain; increases in blood sugar; decreased immune system response; increased appetite. Occasional: Facial flushing (red, warm cheeks); mood swings; menstrual changes. Uncommon: Long-term decrease or suppression of natural hormones; bone thinning. (These are more common with higher doses or more frequent use. This is why we prefer that our patients avoid having any injection therapies in other practices.)  Very Rare: Severe mood changes; psychosis; aseptic necrosis. From procedure: Some discomfort is to be expected once the numbing medicine wears off. This should be minimal if ice and heat are applied as instructed.  Call if: (When should I call?) You experience numbness and weakness that gets worse with time, as opposed to wearing off. New onset bowel or bladder incontinence. (Applies only to procedures done in the spine)  Emergency Numbers: Durning business hours (Monday - Thursday, 8:00 AM - 4:00 PM) (Friday, 9:00 AM - 12:00 Noon): [phone] After hours: [phone] NOTE: If you are having a problem and are unable connect with, or to talk to a provider, then go to your nearest urgent care or emergency department. If the problem is serious and urgent, please call 911. ____________________________________________________________________________________________  ____________________________________________________________________________________________  Muscle Spasms & Cramps  Cause:  The most common cause of muscle spasms and cramps is vitamin and/or electrolyte (calcium, potassium, sodium, etc.) deficiencies.  Possible triggers: Sweating - causes loss of electrolytes thru the skin. Steroids - causes loss of electrolytes thru the urine.  Treatment: Gatorade (or any other electrolyte-replenishing drink) - Take 1, 8 oz glass with each meal (3 times a day). OTC (over-the-counter) Magnesium 400 to 500 mg - Take 1 tablet twice a day (one with breakfast and one before bedtime).  If you have kidney problems, talk to your primary care physician before taking any Magnesium. Tonic Water with quinine - Take 1, 8 oz glass before bedtime.   ____________________________________________________________________________________________

## 2021-07-10 NOTE — Progress Notes (Signed)
PROVIDER NOTE: Information contained herein reflects review and annotations entered in association with encounter. Interpretation of such information and data should be left to medically-trained personnel. Information provided to patient can be located elsewhere in the medical record under "Patient Instructions". Document created using STT-dictation technology, any transcriptional errors that may result from process are unintentional.    Patient: Brittney Simpson  Service Category: Procedure Provider: Gaspar Cola, MD DOB: 18-[id number]-1927 DOS: 07/10/2021 Location: [facility] MRN: [id number] Setting: Ambulatory - outpatient Referring Provider: Nobie Putnam * Type: Established Patient Specialty: Interventional Pain Management PCP: Olin Hauser, DO  Primary Reason for Visit: Interventional Pain Management Treatment. CC: Back Pain (lower)    Procedure:          Type: Lumbar Facet, Medial Branch Block(s)          Primary Purpose: Diagnostic/Therapeutic Region: Posterolateral Lumbosacral Spine Level: L2, L3, L4, L5, & S1 Medial Branch Level(s). Injecting these levels blocks the L3-4, L4-5, and L5-S1 lumbar facet joints. Laterality: Right Anesthesia: Local (1-2% Lidocaine)  Anxiolysis: None  Sedation: None  Guidance: Fluoroscopy          Indications: 1. Lumbar facet syndrome (Bilateral) (R>L)   2. Spondylosis without myelopathy or radiculopathy, lumbosacral region   3. Chronic low back pain (1ry area of Pain) (Bilateral) (R>L) w/o sciatica   4. DDD (degenerative disc disease), lumbosacral   5. Lumbar facet hypertrophy   6. Spondylolisthesis of lumbosacral region (L2-3 and L5-S1)    Pain Score: Pre-procedure: 8 /10 Post-procedure: 3 /10     Position: Prone  Pre-op H&P Assessment:  Brittney Simpson is a 87 y.o. (year old), female patient, seen today for interventional treatment. She  has a past surgical history that includes Hernia repair; Hernia  repair; Hemorrhoid surgery; Kyphoplasty (N/A, 01/23/2017); Kyphoplasty (N/A, 01/25/2020); and Cholecystectomy. Brittney Simpson has a current medication list which includes the following prescription(s): acetaminophen, amlodipine, vitamin d3, clonazepam, cranberry, furosemide, ibuprofen, latanoprost, levothyroxine, lisinopril, melatonin, metoprolol succinate, omeprazole, sulfamethoxazole-trimethoprim, tamsulosin, tramadol, and trazodone, and the following Facility-Administered Medications: pentafluoroprop-tetrafluoroeth. Her primarily concern today is the Back Pain (lower)  Initial Vital Signs:  Pulse/HCG Rate: 63ECG Heart Rate: 65 Temp: (!) 96.7 F (35.9 C) Resp: 20 BP: (!) 176/79 SpO2: 98 %  BMI: Estimated body mass index is 20.08 kg/m as calculated from the following:   Height as of this encounter: 5\' 4"  (1.626 m).   Weight as of this encounter: 117 lb (53.1 kg).  Risk Assessment: Allergies: Reviewed. She has No Known Allergies.  Allergy Precautions: None required Coagulopathies: Reviewed. None identified.  Blood-thinner therapy: None at this time Active Infection(s): Reviewed. None identified. Brittney Simpson is afebrile  Site Confirmation: Brittney Simpson was asked to confirm the procedure and laterality before marking the site Procedure checklist: Completed Consent: Before the procedure and under the influence of no sedative(s), amnesic(s), or anxiolytics, the patient was informed of the treatment options, risks and possible complications. To fulfill our ethical and legal obligations, as recommended by the American Medical Association's Code of Ethics, I have informed the patient of my clinical impression; the nature and purpose of the treatment or procedure; the risks, benefits, and possible complications of the intervention; the alternatives, including doing nothing; the risk(s) and benefit(s) of the alternative treatment(s) or procedure(s); and the risk(s) and benefit(s) of doing nothing. The  patient was provided information about the general risks and possible complications associated with the procedure. These may include, but are not limited to: failure to achieve desired  goals, infection, bleeding, organ or nerve damage, allergic reactions, paralysis, and death. In addition, the patient was informed of those risks and complications associated to Spine-related procedures, such as failure to decrease pain; infection (i.e.: Meningitis, epidural or intraspinal abscess); bleeding (i.e.: epidural hematoma, subarachnoid hemorrhage, or any other type of intraspinal or peri-dural bleeding); organ or nerve damage (i.e.: Any type of peripheral nerve, nerve root, or spinal cord injury) with subsequent damage to sensory, motor, and/or autonomic systems, resulting in permanent pain, numbness, and/or weakness of one or several areas of the body; allergic reactions; (i.e.: anaphylactic reaction); and/or death. Furthermore, the patient was informed of those risks and complications associated with the medications. These include, but are not limited to: allergic reactions (i.e.: anaphylactic or anaphylactoid reaction(s)); adrenal axis suppression; blood sugar elevation that in diabetics may result in ketoacidosis or comma; water retention that in patients with history of congestive heart failure may result in shortness of breath, pulmonary edema, and decompensation with resultant heart failure; weight gain; swelling or edema; medication-induced neural toxicity; particulate matter embolism and blood vessel occlusion with resultant organ, and/or nervous system infarction; and/or aseptic necrosis of one or more joints. Finally, the patient was informed that Medicine is not an exact science; therefore, there is also the possibility of unforeseen or unpredictable risks and/or possible complications that may result in a catastrophic outcome. The patient indicated having understood very clearly. We have given the patient no  guarantees and we have made no promises. Enough time was given to the patient to ask questions, all of which were answered to the patient's satisfaction. Brittney Simpson has indicated that she wanted to continue with the procedure. Attestation: I, the ordering provider, attest that I have discussed with the patient the benefits, risks, side-effects, alternatives, likelihood of achieving goals, and potential problems during recovery for the procedure that I have provided informed consent. Date  Time: 07/10/2021  8:39 AM  Pre-Procedure Preparation:  Monitoring: As per clinic protocol. Respiration, ETCO2, SpO2, BP, heart rate and rhythm monitor placed and checked for adequate function Safety Precautions: Patient was assessed for positional comfort and pressure points before starting the procedure. Time-out: I initiated and conducted the "Time-out" before starting the procedure, as per protocol. The patient was asked to participate by confirming the accuracy of the "Time Out" information. Verification of the correct person, site, and procedure were performed and confirmed by me, the nursing staff, and the patient. "Time-out" conducted as per Joint Commission's Universal Protocol (UP.01.01.01). Time: 0912  Description of Procedure:          Laterality: Right Levels:  L2, L3, L4, L5, & S1 Medial Branch Level(s) Area Prepped: Posterior Lumbosacral Region DuraPrep (Iodine Povacrylex [0.7% available iodine] and Isopropyl Alcohol, 74% w/w) Safety Precautions: Aspiration looking for blood return was conducted prior to all injections. At no point did we inject any substances, as a needle was being advanced. Before injecting, the patient was told to immediately notify me if she was experiencing any new onset of "ringing in the ears, or metallic taste in the mouth". No attempts were made at seeking any paresthesias. Safe injection practices and needle disposal techniques used. Medications properly checked for  expiration dates. SDV (single dose vial) medications used. After the completion of the procedure, all disposable equipment used was discarded in the proper designated medical waste containers. Local Anesthesia: Protocol guidelines were followed. The patient was positioned over the fluoroscopy table. The area was prepped in the usual manner. The time-out was completed. The target  area was identified using fluoroscopy. A 12-in long, straight, sterile hemostat was used with fluoroscopic guidance to locate the targets for each level blocked. Once located, the skin was marked with an approved surgical skin marker. Once all sites were marked, the skin (epidermis, dermis, and hypodermis), as well as deeper tissues (fat, connective tissue and muscle) were infiltrated with a small amount of a short-acting local anesthetic, loaded on a 10cc syringe with a 25G, 1.5-in  Needle. An appropriate amount of time was allowed for local anesthetics to take effect before proceeding to the next step. Local Anesthetic: Lidocaine 2.0% The unused portion of the local anesthetic was discarded in the proper designated containers. Technical explanation of process:  L2 Medial Branch Nerve Block (MBB): The target area for the L2 medial branch is at the junction of the postero-lateral aspect of the superior articular process and the superior, posterior, and medial edge of the transverse process of L3. Under fluoroscopic guidance, a Quincke needle was inserted until contact was made with os over the superior postero-lateral aspect of the pedicular shadow (target area). After negative aspiration for blood, 0.5 mL of the nerve block solution was injected without difficulty or complication. The needle was removed intact. L3 Medial Branch Nerve Block (MBB): The target area for the L3 medial branch is at the junction of the postero-lateral aspect of the superior articular process and the superior, posterior, and medial edge of the transverse  process of L4. Under fluoroscopic guidance, a Quincke needle was inserted until contact was made with os over the superior postero-lateral aspect of the pedicular shadow (target area). After negative aspiration for blood, 0.5 mL of the nerve block solution was injected without difficulty or complication. The needle was removed intact. L4 Medial Branch Nerve Block (MBB): The target area for the L4 medial branch is at the junction of the postero-lateral aspect of the superior articular process and the superior, posterior, and medial edge of the transverse process of L5. Under fluoroscopic guidance, a Quincke needle was inserted until contact was made with os over the superior postero-lateral aspect of the pedicular shadow (target area). After negative aspiration for blood, 0.5 mL of the nerve block solution was injected without difficulty or complication. The needle was removed intact. L5 Medial Branch Nerve Block (MBB): The target area for the L5 medial branch is at the junction of the postero-lateral aspect of the superior articular process and the superior, posterior, and medial edge of the sacral ala. Under fluoroscopic guidance, a Quincke needle was inserted until contact was made with os over the superior postero-lateral aspect of the pedicular shadow (target area). After negative aspiration for blood, 0.5 mL of the nerve block solution was injected without difficulty or complication. The needle was removed intact. S1 Medial Branch Nerve Block (MBB): The target area for the S1 medial branch is at the posterior and inferior 6 o'clock position of the L5-S1 facet joint. Under fluoroscopic guidance, the Quincke needle inserted for the L5 MBB was redirected until contact was made with os over the inferior and postero aspect of the sacrum, at the 6 o' clock position under the L5-S1 facet joint (Target area). After negative aspiration for blood, 0.5 mL of the nerve block solution was injected without difficulty or  complication. The needle was removed intact.  Nerve block solution: 0.2% PF-Ropivacaine + Triamcinolone (40 mg/mL) diluted to a final concentration of 4 mg of Triamcinolone/mL of Ropivacaine The unused portion of the solution was discarded in the proper designated containers.  Procedural Needles: 22-gauge, 3.5-inch, Quincke needles used for all levels.  Once the entire procedure was completed, the treated area was cleaned, making sure to leave some of the prepping solution back to take advantage of its long term bactericidal properties.      Illustration of the posterior view of the lumbar spine and the posterior neural structures. Laminae of L2 through S1 are labeled. DPRL5, dorsal primary ramus of L5; DPRS1, dorsal primary ramus of S1; DPR3, dorsal primary ramus of L3; FJ, facet (zygapophyseal) joint L3-L4; I, inferior articular process of L4; LB1, lateral branch of dorsal primary ramus of L1; IAB, inferior articular branches from L3 medial branch (supplies L4-L5 facet joint); IBP, intermediate branch plexus; MB3, medial branch of dorsal primary ramus of L3; NR3, third lumbar nerve root; S, superior articular process of L5; SAB, superior articular branches from L4 (supplies L4-5 facet joint also); TP3, transverse process of L3.  Vitals:   07/10/21 0835 07/10/21 0909 07/10/21 0914 07/10/21 0916  BP: (!) 176/79 (!) 189/67 (!) 181/71 (!) 176/69  Pulse: 63     Resp: 20 13 13 14   Temp: (!) 96.7 F (35.9 C)     SpO2: 98% 95% 95% 94%  Weight: 117 lb (53.1 kg)     Height: 5\' 4"  (1.626 m)        Start Time: 0912 hrs. End Time: 0916 hrs.  Imaging Guidance (Spinal):          Type of Imaging Technique: Fluoroscopy Guidance (Spinal) Indication(s): Assistance in needle guidance and placement for procedures requiring needle placement in or near specific anatomical locations not easily accessible without such assistance. Exposure Time: Please see nurses notes. Contrast: None used. Fluoroscopic  Guidance: I was personally present during the use of fluoroscopy. "Tunnel Vision Technique" used to obtain the best possible view of the target area. Parallax error corrected before commencing the procedure. "Direction-depth-direction" technique used to introduce the needle under continuous pulsed fluoroscopy. Once target was reached, antero-posterior, oblique, and lateral fluoroscopic projection used confirm needle placement in all planes. Images permanently stored in EMR. Interpretation: No contrast injected. I personally interpreted the imaging intraoperatively. Adequate needle placement confirmed in multiple planes. Permanent images saved into the patient's record.  Antibiotic Prophylaxis:   Anti-infectives (From admission, onward)    None      Indication(s): None identified  Post-operative Assessment:  Post-procedure Vital Signs:  Pulse/HCG Rate: 6361 Temp: (!) 96.7 F (35.9 C) Resp: 14 BP: (!) 176/69 SpO2: 94 %  EBL: None  Complications: No immediate post-treatment complications observed by team, or reported by patient.  Note: The patient tolerated the entire procedure well. A repeat set of vitals were taken after the procedure and the patient was kept under observation following institutional policy, for this type of procedure. Post-procedural neurological assessment was performed, showing return to baseline, prior to discharge. The patient was provided with post-procedure discharge instructions, including a section on how to identify potential problems. Should any problems arise concerning this procedure, the patient was given instructions to immediately contact us, at any time, without hesitation. In any case, we plan to contact the patient by telephone for a follow-up status report regarding this interventional procedure.  Comments:  No additional relevant information.  Plan of Care  Orders:  Orders Placed This Encounter  Procedures   LUMBAR FACET(MEDIAL BRANCH NERVE  BLOCK) MBNB    Scheduling Instructions:     Procedure: Lumbar facet block (AKA.: Lumbosacral medial branch nerve block)     Side: Right-sided  Level: L3-4, L4-5, & L5-S1 Facets (L2, L3, L4, L5, & S1 Medial Branch Nerves)     Sedation: Patient's choice.     Timeframe: Today    Order Specific Question:   Where will this procedure be performed?    Answer:   [facility]   DG [facility] C-ARM 1-60 MIN NO REPORT    Intraoperative interpretation by procedural physician at [facility].    Standing Status:   Standing    Number of Occurrences:   1    Order Specific Question:   Reason for exam:    Answer:   Assistance in needle guidance and placement for procedures requiring needle placement in or near specific anatomical locations not easily accessible without such assistance.   Informed Consent Details: Physician/Practitioner Attestation; Transcribe to consent form and obtain patient signature    Nursing Order: Transcribe to consent form and obtain patient signature. Note: Always confirm laterality of pain with Ms. Linford, before procedure.    Order Specific Question:   Physician/Practitioner attestation of informed consent for procedure/surgical case    Answer:   I, the physician/practitioner, attest that I have discussed with the patient the benefits, risks, side effects, alternatives, likelihood of achieving goals and potential problems during recovery for the procedure that I have provided informed consent.    Order Specific Question:   Procedure    Answer:   Lumbar Facet Block  under fluoroscopic guidance    Order Specific Question:   Physician/Practitioner performing the procedure    Answer:   Drenda Sobecki A. Dossie Arbour MD    Order Specific Question:   Indication/Reason    Answer:   Low Back Pain, with our without leg pain, due to Facet Joint Arthralgia (Joint Pain) Spondylosis (Arthritis of the Spine), without myelopathy or radiculopathy (Nerve Damage).   Provide equipment  / supplies at bedside    "Block Tray" (Disposable  single use) Needle type: SpinalSpinal Amount/quantity: 4 Size: Regular (3.5-inch) Gauge: 22G    Standing Status:   Standing    Number of Occurrences:   1    Order Specific Question:   Specify    Answer:   Block Tray    Chronic Opioid Analgesic:  Tramadol 50 mg, 1 tab PO q 8 hrs (150 mg/day of tramadol) MME/day: 15 mg/day.   Medications ordered for procedure: Meds ordered this encounter  Medications   lidocaine (XYLOCAINE) 2 % (with pres) injection 400 mg   pentafluoroprop-tetrafluoroeth (GEBAUERS) aerosol   ropivacaine (PF) 2 mg/mL (0.2%) (NAROPIN) injection 9 mL   triamcinolone acetonide (KENALOG-40) injection 40 mg    Medications administered: We administered lidocaine, ropivacaine (PF) 2 mg/mL (0.2%), and triamcinolone acetonide.  See the medical record for exact dosing, route, and time of administration.  Follow-up plan:   Return in about 2 weeks (around 07/24/2021) for Proc-day (T,Th), (VV), (PPE).       Interventional Therapies  Risk  Complexity Considerations:   Estimated body mass index is 20.19 kg/m as calculated from the following:   Height as of this encounter: 5\' 3"  (1.6 m).   Weight as of this encounter: 114 lb (51.7 kg). Note: Hard of hearing   Planned  Pending:      Under consideration:   Diagnostic right hip joint injection  Diagnostic right trochanteric bursa injection  Diagnostic midline caudal ESI (for the tailbone pain)   Completed:   Palliative right T12-L1 LESI x5 (04/24/2021)  Therapeutic right L2-3 LESI x1 (03/07/2020)  Diagnostic/therapeutic bilateral SI joint injection  x1 (02/10/2020)  Palliative right lumbar facet block x13 (01/04/2020)  Palliative left lumbar facet block x11 (01/04/2020)    Therapeutic  Palliative (PRN) options:   Palliative T12-L1 LESI      Recent Visits Date Type Provider Dept  07/04/21 Office Visit Milinda Pointer, MD [facility]  05/10/21 Office  Visit Milinda Pointer, MD [facility]  04/24/21 Procedure visit Milinda Pointer, MD [facility]  04/18/21 Office Visit Milinda Pointer, MD [facility]  Showing recent visits within past 90 days and meeting all other requirements Today's Visits Date Type Provider Dept  07/10/21 Procedure visit Milinda Pointer, MD [facility]  Showing today's visits and meeting all other requirements Future Appointments Date Type Provider Dept  07/26/21 Appointment Milinda Pointer, MD [facility]  Showing future appointments within next 90 days and meeting all other requirements Disposition: Discharge home  Discharge (Date  Time): 07/10/2021; 0921 hrs.   Primary Care Physician: Olin Hauser, DO Location: [facility] Note by: Gaspar Cola, MD Date: 07/10/2021; Time: 10:03 AM  Disclaimer:  Medicine is not an Chief Strategy Officer. The only guarantee in medicine is that nothing is guaranteed. It is important to note that the decision to proceed with this intervention was based on the information collected from the patient. The Data and conclusions were drawn from the patient's questionnaire, the interview, and the physical examination. Because the information was provided in large part by the patient, it cannot be guaranteed that it has not been purposely or unconsciously manipulated. Every effort has been made to obtain as much relevant data as possible for this evaluation. It is important to note that the conclusions that lead to this procedure are derived in large part from the available data. Always take into account that the treatment will also be dependent on availability of resources and existing treatment guidelines, considered by other Pain Management Practitioners as being common knowledge and practice, at the time of the intervention. For Medico-Legal purposes, it is also important to point out that  variation in procedural techniques and pharmacological choices are the acceptable norm. The indications, contraindications, technique, and results of the above procedure should only be interpreted and judged by a Board-Certified Interventional Pain Specialist with extensive familiarity and expertise in the same exact procedure and technique.

## 2021-07-11 ENCOUNTER — Telehealth: Payer: Self-pay | Admitting: *Deleted

## 2021-07-11 NOTE — Telephone Encounter (Signed)
Called daughter for post procedure check. No answer. LVM.

## 2021-07-12 DIAGNOSIS — H401133 Primary open-angle glaucoma, bilateral, severe stage: Secondary | ICD-10-CM | POA: Diagnosis not present

## 2021-07-25 ENCOUNTER — Telehealth: Payer: Self-pay

## 2021-07-25 NOTE — Progress Notes (Signed)
Patient: Brittney Simpson  Service Category: E/M  Provider: Gaspar Cola, MD  DOB: 04-07-1928  DOS: 07/26/2021  Location: Office  MRN: [id number]  Setting: Ambulatory outpatient  Referring Provider: Nobie Putnam *  Type: Established Patient  Specialty: Interventional Pain Management  PCP: Olin Hauser, DO  Location: Remote location  Delivery: TeleHealth     Virtual Encounter - Pain Management PROVIDER NOTE: Information contained herein reflects review and annotations entered in association with encounter. Interpretation of such information and data should be left to medically-trained personnel. Information provided to patient can be located elsewhere in the medical record under "Patient Instructions". Document created using STT-dictation technology, any transcriptional errors that may result from process are unintentional.    Contact & Pharmacy Preferred: [place]: [phone] (home) Mobile: [phone] (mobile) E-mail: [email or web address]  [place], [place]. [place] [address] Phone: [phone] Fax: [phone]   Pre-screening  Ms. Yu offered "in-person" vs "virtual" encounter. She indicated preferring virtual for this encounter.   Reason COVID-19*   Social distancing based on CDC and AMA recommendations.   I contacted Brittney Simpson on 07/26/2021 via telephone.      I clearly identified myself as Gaspar Cola, MD. I verified that I was speaking with the correct person using two identifiers (Name: Brittney Simpson, and date of birth: 01-01-1928).  Consent I sought verbal advanced consent from Brittney Simpson for virtual visit interactions. I informed Brittney Simpson of possible security and privacy concerns, risks, and limitations associated with providing "not-in-person" medical evaluation and management services. I also informed Brittney Simpson of the availability of "in-person"  appointments. Finally, I informed her that there would be a charge for the virtual visit and that she could be  personally, fully or partially, financially responsible for it. Brittney Simpson expressed understanding and agreed to proceed.   Historic Elements   Brittney Simpson is a 87 y.o. year old, female patient evaluated today after our last contact on 07/10/2021. Brittney Simpson  has a past medical history of Anxiety, Arthritis, Back ache, Hiatal hernia, Hyperlipidemia, Hypertension, Hypothyroid, Osteopenia, Thyroid disease, and UTI (urinary tract infection). She also  has a past surgical history that includes Hernia repair; Hernia repair; Hemorrhoid surgery; Kyphoplasty (N/A, 01/23/2017); Kyphoplasty (N/A, 01/25/2020); and Cholecystectomy. Brittney Simpson has a current medication list which includes the following prescription(s): acetaminophen, amlodipine, vitamin d3, clonazepam, cranberry, furosemide, ibuprofen, latanoprost, levothyroxine, lisinopril, melatonin, metoprolol succinate, omeprazole, sulfamethoxazole-trimethoprim, tamsulosin, tramadol, methocarbamol, and trazodone. She  reports that she has never smoked. She has never used smokeless tobacco. She reports that she does not drink alcohol and does not use drugs. Brittney Simpson has No Known Allergies.   HPI  Today, she is being contacted for a post-procedure assessment.  The patient indicates having attained 100% relief of the pain for the duration of the local anesthetic, but once the local anesthetic started wearing off the relief went down to about a 75% benefit that lasted only for about 7 days.  On the patient's last visit to our clinics on 07/04/2021, we had had a long conversation with the daughter thought that perhaps going back to the lumbar facet blocks would provide her with better relief of the pain then the epidural steroid injection.  She had indicated being under the impression that the epidural steroid injections were not lasting as long  as the facet blocks.  However, I reminded her that this was before she had  fractured her lumbar spine and had to undergo the kyphoplasty.  After that, we attempted doing a lumbar facet block, I did not provide her with significant benefit and therefore we had to look at the possible reasons for this and I had determined that there was the possibility that she was having some pain coming from the area of the fracture.  For this reason we went ahead and did a lumbar epidural steroid injection in that region and it did provide her with significant and longer lasting benefit.  However, it has been sometime since that fracture occurred and there was also a possibility that the area might have healed and perhaps her primary pain was in the Lumbar region, but instead of being primarily radicular, he could be more from the facet joints.  Because the daughter had some doubts with regards to how the lumbar epidural steroid injection effectiveness versus the facet block, she asked if it would be unreasonable to do a diagnostic bilateral lumbar facet block.  This is the primary reason why we did this last injection and it has proven to have given the patient a lot lesser duration of benefit than that of the epidural.  In talking to the patient's daughter today, she recognizes that this is the case and she agrees that we may have to bring her mother back to do the T12 epidural steroid injection.  Because the duration of these procedures has changed while the technique has not, I get the impression that her condition is progressing.  For this reason I recommended doing a lumbar and thoracic x-ray to compare with the available ones done on 01/19/2020 and 01/25/2020.  [name] indicated that she had recently gone back to the orthopedic surgeons and that they had done some x-rays at their practice and that they indicated that there is seem to be no problems.  Unfortunately, neither the x-ray images nor the official  reports are available for [place] to examine.  In looking at care everywhere, I see a reference of some x-rays of the thoracic spine done on 03/06/2021, but it says that this is for reference only and he has an attachment that its not available.  Images are also not available.  For the time being, we have decided to simply wait and see how long it takes for the pain to get to the point where she begins to request another injection.  At that time, we will probably go with the epidural steroid injection and consider repeating the lumbar and thoracic spine x-rays.  The plan was shared with the patient's daughter who agreed with that.  [name] also indicated that her mother is beginning to experience some discomfort in the cervical region for which we had provided her with some Robaxin 500 mg pills in the past.  She refers that although I had given them 2 refills, there were using a PRN and the refills expired.  They requested that I send a prescription to renew those.  They indicated that not only did help cervical region but they also seems to help with the lower back pain.  Post-procedure evaluation     Procedure:          Type: Lumbar Facet, Medial Branch Block(s)          Primary Purpose: Diagnostic/Therapeutic Region: Posterolateral Lumbosacral Spine Level: L2, L3, L4, L5, & S1 Medial Branch Level(s). Injecting these levels blocks the L3-4, L4-5, and L5-S1 lumbar facet joints. Laterality: Right Anesthesia: Local (  1-2% Lidocaine)  Anxiolysis: None  Sedation: None  Guidance: Fluoroscopy          Indications: 1. Lumbar facet syndrome (Bilateral) (R>L)   2. Spondylosis without myelopathy or radiculopathy, lumbosacral region   3. Chronic low back pain (1ry area of Pain) (Bilateral) (R>L) w/o sciatica   4. DDD (degenerative disc disease), lumbosacral   5. Lumbar facet hypertrophy   6. Spondylolisthesis of lumbosacral region (L2-3 and L5-S1)    Pain Score: Pre-procedure: 8  /10 Post-procedure: 3 /10     Effectiveness:  Initial hour after procedure: 100 %. Subsequent 4-6 hours post-procedure: 100 %. Analgesia past initial 6 hours: 75 % (lasting 7 days). Ongoing improvement:  Analgesic: The patient is apparently having around 50% ongoing relief of the pain, but this is slowly going away. Function: Somewhat improved ROM: Somewhat improved  Pharmacotherapy Assessment   Opioid Analgesic: Tramadol 50 mg, 1 tab PO q 8 hrs (150 mg/day of tramadol) MME/day: 15 mg/day.   Monitoring: [place] PMP: PDMP reviewed during this encounter.       Pharmacotherapy: No side-effects or adverse reactions reported. Compliance: No problems identified. Effectiveness: Clinically acceptable. Plan: Refer to "POC". UDS:  Summary  Date Value Ref Range Status  01/24/2021 Note  Final    Comment:    ==================================================================== [employer] Select 13 (MW) ==================================================================== Specimen Alert Note: Urinary creatinine is low; ability to detect some drugs may be compromised. Interpret results with caution. (Creatinine) ==================================================================== Test                             Result       Flag       Units  Drug Present and Declared for Prescription Verification   7-aminoclonazepam              287          EXPECTED   ng/mg creat    7-aminoclonazepam is an expected metabolite of clonazepam. Source of    clonazepam is a scheduled prescription medication.    Tramadol                       >33333       EXPECTED   ng/mg creat   O-Desmethyltramadol            21013        EXPECTED   ng/mg creat   N-Desmethyltramadol            7287         EXPECTED   ng/mg creat    Source of tramadol is a prescription medication. O-desmethyltramadol    and N-desmethyltramadol are expected metabolites of  tramadol.  ==================================================================== Test                      Result    Flag   Units      Ref Range   Creatinine              15        LL     mg/dL      >=20 ==================================================================== Declared Medications:  The flagging and interpretation on this report are based on the  following declared medications.  Unexpected results may arise from  inaccuracies in the declared medications.   **Note: The testing scope of this panel includes these medications:   Clonazepam (Klonopin)  Tramadol (Ultram)   **Note: The testing scope of  this panel does not include the  following reported medications:   Acetaminophen (Tylenol)  Amlodipine  Cranberry  Furosemide  Latanoprost (Xalatan)  Levothyroxine  Lisinopril  Melatonin  Metoprolol  Omeprazole  Sulfamethoxazole (Bactrim)  Tamsulosin (Flomax)  Trazodone  Trimethoprim (Bactrim)  Vitamin D2 ==================================================================== For clinical consultation, please call [phone]. ====================================================================      Laboratory Chemistry Profile   Renal Lab Results  Component Value Date   BUN 8 03/07/2021   CREATININE 0.75 89/[birth year]/1941   BCR NOT APPLICABLE 74/02/1447   GFRAA 76 05/16/2020   GFRNONAA 66 05/16/2020    Hepatic Lab Results  Component Value Date   AST 10 03/07/2021   ALT 7 03/07/2021   ALBUMIN 3.6 11/22/2016   ALKPHOS 59 11/22/2016   LIPASE 21 06/14/2016    Electrolytes Lab Results  Component Value Date   NA 135 03/07/2021   K 3.9 03/07/2021   CL 95 (L) 03/07/2021   CALCIUM 9.6 03/07/2021   MG 2.1 01/14/2014    Bone No results found for: VD25OH, VD125OH2TOT, JE5631SH7, WY6378HY8, 25OHVITD1, 25OHVITD2, 25OHVITD3, TESTOFREE, TESTOSTERONE  Inflammation (CRP: Acute Phase) (ESR: Chronic Phase) No results found for: CRP, ESRSEDRATE, LATICACIDVEN        Note: Above Lab results reviewed.  Imaging  DG PAIN CLINIC C-ARM 1-60 MIN NO REPORT Fluoro was used, but no Radiologist interpretation will be provided.  Please refer to "NOTES" tab for provider progress note.  Assessment  The primary encounter diagnosis was Chronic low back pain (1ry area of Pain) (Bilateral) (R>L) w/o sciatica. Diagnoses of Spasm of back muscles, Chronic lumbar radicular pain (Right), DDD (degenerative disc disease), thoracolumbar, Lumbar lateral recess stenosis, Non-traumatic compression fracture of T7 thoracic vertebra, sequela, Spondylolisthesis of lumbosacral region (L2-3 and L5-S1), and T12 compression fracture, sequela were also pertinent to this visit.  Plan of Care  Problem-specific:  No problem-specific Assessment & Plan notes found for this encounter.  Brittney Simpson has a current medication list which includes the following long-term medication(s): amlodipine, clonazepam, furosemide, levothyroxine, lisinopril, metoprolol succinate, omeprazole, tramadol, methocarbamol, and trazodone.  Pharmacotherapy (Medications Ordered): Meds ordered this encounter  Medications   methocarbamol (ROBAXIN) 500 MG tablet    Sig: Take 1 tablet (500 mg total) by mouth daily as needed for muscle spasms.    Dispense:  30 tablet    Refill:  0    Do not place this medication, or any other prescription from our practice, on "Automatic Refill". Patient may have prescription filled one day early if pharmacy is closed on scheduled refill date.   Orders:  No orders of the defined types were placed in this encounter.  Follow-up plan:   No follow-ups on file.     Interventional Therapies  Risk   Complexity Considerations:   Estimated body mass index is 20.19 kg/m as calculated from the following:   Height as of this encounter: _0  (1.6 m).   Weight as of this encounter: 114 lb (51.7 kg). Note: Hard of hearing   Planned   Pending:      Under consideration:    Diagnostic right hip joint injection  Diagnostic right trochanteric bursa injection  Diagnostic midline caudal ESI (for the tailbone pain)   Completed:   Palliative right T12-L1 LESI x5 (04/24/2021)  Therapeutic right L2-3 LESI x1 (03/07/2020)  Diagnostic/therapeutic bilateral SI joint injection x1 (02/10/2020)  Palliative right lumbar facet block x13 (01/04/2020)  Palliative left lumbar facet block x11 (01/04/2020)    Therapeutic   Palliative (PRN)  options:   Palliative T12-L1 LESI     Recent Visits Date Type Provider Dept  07/10/21 Procedure visit Milinda Pointer, MD [facility]  07/04/21 Office Visit Milinda Pointer, MD [facility]  05/10/21 Office Visit Milinda Pointer, MD [facility]  Showing recent visits within past 90 days and meeting all other requirements Today's Visits Date Type Provider Dept  07/26/21 Office Visit Milinda Pointer, MD [facility]  Showing today's visits and meeting all other requirements Future Appointments Date Type Provider Dept  10/10/21 Appointment Milinda Pointer, MD [facility]  Showing future appointments within next 90 days and meeting all other requirements  I discussed the assessment and treatment plan with the patient. The patient was provided an opportunity to ask questions and all were answered. The patient agreed with the plan and demonstrated an understanding of the instructions.  Patient advised to call back or seek an in-person evaluation if the symptoms or condition worsens.  Duration of encounter: 16 minutes.  Note by: Gaspar Cola, MD Date: 07/26/2021; Time: 3:08 PM

## 2021-07-26 ENCOUNTER — Other Ambulatory Visit: Payer: Self-pay

## 2021-07-26 ENCOUNTER — Ambulatory Visit: Payer: Medicare Other | Attending: Pain Medicine | Admitting: Pain Medicine

## 2021-07-26 DIAGNOSIS — M4317 Spondylolisthesis, lumbosacral region: Secondary | ICD-10-CM | POA: Diagnosis not present

## 2021-07-26 DIAGNOSIS — G8929 Other chronic pain: Secondary | ICD-10-CM | POA: Diagnosis not present

## 2021-07-26 DIAGNOSIS — M5135 Other intervertebral disc degeneration, thoracolumbar region: Secondary | ICD-10-CM

## 2021-07-26 DIAGNOSIS — M545 Low back pain, unspecified: Secondary | ICD-10-CM | POA: Diagnosis not present

## 2021-07-26 DIAGNOSIS — M4854XS Collapsed vertebra, not elsewhere classified, thoracic region, sequela of fracture: Secondary | ICD-10-CM | POA: Diagnosis not present

## 2021-07-26 DIAGNOSIS — M62830 Muscle spasm of back: Secondary | ICD-10-CM

## 2021-07-26 DIAGNOSIS — M5416 Radiculopathy, lumbar region: Secondary | ICD-10-CM

## 2021-07-26 DIAGNOSIS — M48061 Spinal stenosis, lumbar region without neurogenic claudication: Secondary | ICD-10-CM

## 2021-07-26 DIAGNOSIS — S22080S Wedge compression fracture of T11-T12 vertebra, sequela: Secondary | ICD-10-CM | POA: Diagnosis not present

## 2021-07-26 MED ORDER — METHOCARBAMOL 500 MG PO TABS: 500.0000 mg | ORAL_TABLET | Freq: Every day | ORAL | 0 refills | Status: DC | PRN

## 2021-08-02 ENCOUNTER — Other Ambulatory Visit: Payer: Self-pay | Admitting: Family Medicine

## 2021-08-02 DIAGNOSIS — N183 Chronic kidney disease, stage 3 unspecified: Secondary | ICD-10-CM

## 2021-08-02 NOTE — Telephone Encounter (Signed)
Caller states can PCP expedite metoprolol succinate (TOPROL-XL) 100 MG 24 hr tablet request and please send it in today.   [place], [place]. Phone:  [phone]  Fax:  [phone]

## 2021-08-09 ENCOUNTER — Other Ambulatory Visit: Payer: Self-pay | Admitting: Family Medicine

## 2021-08-09 DIAGNOSIS — F419 Anxiety disorder, unspecified: Secondary | ICD-10-CM
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
## 2021-08-21 DIAGNOSIS — H401133 Primary open-angle glaucoma, bilateral, severe stage: Secondary | ICD-10-CM | POA: Diagnosis not present

## 2021-09-17 NOTE — Progress Notes (Signed)
PROVIDER NOTE: Interpretation of information contained herein should be left to medically-trained personnel. Specific patient instructions are provided elsewhere under "Patient Instructions" section of medical record. This document was created in part using STT-dictation technology, any transcriptional errors that may result from this process are unintentional.  Patient: Brittney Simpson Type: Established DOB: 04-09-1928 MRN: [id number] PCP: Olin Hauser, DO  Service: Procedure DOS: 09/18/2021 Setting: Ambulatory Location: Ambulatory outpatient facility Delivery: Face-to-face Provider: Gaspar Cola, MD Specialty: Interventional Pain Management Specialty designation: 09 Location: Outpatient facility Ref. Prov.: Nobie Putnam *    Primary Reason for Visit: Interventional Pain Management Treatment. CC: Back Pain   Procedure:           Type: Lumbar epidural steroid injection (LESI) (interlaminar)          Laterality: Right   Level:   T12-L1  Level.  Imaging: Fluoroscopic guidance Anesthesia: Local anesthesia (1-2% Lidocaine) Anxiolysis: None                 Sedation: None. DOS: 09/18/2021  Performed by: Gaspar Cola, MD  Purpose: Diagnostic/Therapeutic Indications: Lumbar radicular pain of intraspinal etiology of more than 4 weeks that has failed to respond to conservative therapy and is severe enough to impact quality of life or function. 1. Chronic low back pain (1ry area of Pain) (Bilateral) (R>L) w/o sciatica   2. Chronic lumbar radicular pain (Right)   3. DDD (degenerative disc disease), lumbosacral   4. Lumbar foraminal stenosis   5. Lumbar lateral recess stenosis   6. Spinal stenosis of lumbar region with neurogenic claudication   7. Spondylolisthesis of lumbosacral region (L2-3 and L5-S1)   8. T12 compression fracture, sequela    NAS-11 Pain score:   Pre-procedure: 8 /10   Post-procedure: 5 /10     Position / Prep / Materials:   Position: Prone w/ head of the table raised (slight reverse trendelenburg) to facilitate breathing.  Prep solution: DuraPrep (Iodine Povacrylex [0.7% available iodine] and Isopropyl Alcohol, 74% w/w) Prep Area: Entire Posterior Lumbar Region from lower scapular tip down to mid buttocks area and from flank to flank. Materials:  Tray: Epidural tray Needle(s):  Type: Epidural needle          Gauge (G):  17 Length: Regular (3.5-in) Qty: 1  Pre-op H&P Assessment:  Brittney Simpson is a 88 y.o. (year old), female patient, seen today for interventional treatment. She  has a past surgical history that includes Hernia repair; Hernia repair; Hemorrhoid surgery; Kyphoplasty (N/A, 01/23/2017); Kyphoplasty (N/A, 01/25/2020); and Cholecystectomy. Brittney Simpson has a current medication list which includes the following prescription(s): acetaminophen, amlodipine, vitamin d3, clonazepam, cranberry, furosemide, ibuprofen, latanoprost, levothyroxine, lisinopril, melatonin, methocarbamol, metoprolol succinate, omeprazole, sulfamethoxazole-trimethoprim, tamsulosin, tramadol, and trazodone. Her primarily concern today is the Back Pain  Initial Vital Signs:  Pulse/HCG Rate: 64ECG Heart Rate: (!) 59 Temp: (!) 97.2 F (36.2 C) Resp: 18 BP: (!) 141/81 SpO2: 100 %  BMI: Estimated body mass index is 20.37 kg/m as calculated from the following:   Height as of this encounter: 5\' 3"  (1.6 m).   Weight as of this encounter: 115 lb (52.2 kg).  Risk Assessment: Allergies: Reviewed. She has No Known Allergies.  Allergy Precautions: None required Coagulopathies: Reviewed. None identified.  Blood-thinner therapy: None at this time Active Infection(s): Reviewed. None identified. Brittney Simpson is afebrile  Site Confirmation: Brittney Simpson was asked to confirm the procedure and laterality before marking the site Procedure checklist: Completed Consent: Before the procedure and under  the influence of no sedative(s), amnesic(s),  or anxiolytics, the patient was informed of the treatment options, risks and possible complications. To fulfill our ethical and legal obligations, as recommended by the American Medical Association's Code of Ethics, I have informed the patient of my clinical impression; the nature and purpose of the treatment or procedure; the risks, benefits, and possible complications of the intervention; the alternatives, including doing nothing; the risk(s) and benefit(s) of the alternative treatment(s) or procedure(s); and the risk(s) and benefit(s) of doing nothing. The patient was provided information about the general risks and possible complications associated with the procedure. These may include, but are not limited to: failure to achieve desired goals, infection, bleeding, organ or nerve damage, allergic reactions, paralysis, and death. In addition, the patient was informed of those risks and complications associated to Spine-related procedures, such as failure to decrease pain; infection (i.e.: Meningitis, epidural or intraspinal abscess); bleeding (i.e.: epidural hematoma, subarachnoid hemorrhage, or any other type of intraspinal or peri-dural bleeding); organ or nerve damage (i.e.: Any type of peripheral nerve, nerve root, or spinal cord injury) with subsequent damage to sensory, motor, and/or autonomic systems, resulting in permanent pain, numbness, and/or weakness of one or several areas of the body; allergic reactions; (i.e.: anaphylactic reaction); and/or death. Furthermore, the patient was informed of those risks and complications associated with the medications. These include, but are not limited to: allergic reactions (i.e.: anaphylactic or anaphylactoid reaction(s)); adrenal axis suppression; blood sugar elevation that in diabetics may result in ketoacidosis or comma; water retention that in patients with history of congestive heart failure may result in shortness of breath, pulmonary edema, and  decompensation with resultant heart failure; weight gain; swelling or edema; medication-induced neural toxicity; particulate matter embolism and blood vessel occlusion with resultant organ, and/or nervous system infarction; and/or aseptic necrosis of one or more joints. Finally, the patient was informed that Medicine is not an exact science; therefore, there is also the possibility of unforeseen or unpredictable risks and/or possible complications that may result in a catastrophic outcome. The patient indicated having understood very clearly. We have given the patient no guarantees and we have made no promises. Enough time was given to the patient to ask questions, all of which were answered to the patient's satisfaction. Ms. Colberg has indicated that she wanted to continue with the procedure. Attestation: I, the ordering provider, attest that I have discussed with the patient the benefits, risks, side-effects, alternatives, likelihood of achieving goals, and potential problems during recovery for the procedure that I have provided informed consent. Date   Time: 09/18/2021 10:38 AM  Pre-Procedure Preparation:  Monitoring: As per clinic protocol. Respiration, ETCO2, SpO2, BP, heart rate and rhythm monitor placed and checked for adequate function Safety Precautions: Patient was assessed for positional comfort and pressure points before starting the procedure. Time-out: I initiated and conducted the "Time-out" before starting the procedure, as per protocol. The patient was asked to participate by confirming the accuracy of the "Time Out" information. Verification of the correct person, site, and procedure were performed and confirmed by me, the nursing staff, and the patient. "Time-out" conducted as per Joint Commission's Universal Protocol (UP.01.01.01). Time: 1054  Description/Narrative of Procedure:          Target: Epidural space via interlaminar opening, initially targeting the lower laminar border of  the superior vertebral body. Region: Lumbar Approach: Percutaneous paravertebral  Rationale (medical necessity): procedure needed and proper for the diagnosis and/or treatment of the patient's medical symptoms and needs. Procedural  Technique Safety Precautions: Aspiration looking for blood return was conducted prior to all injections. At no point did we inject any substances, as a needle was being advanced. No attempts were made at seeking any paresthesias. Safe injection practices and needle disposal techniques used. Medications properly checked for expiration dates. SDV (single dose vial) medications used. Description of the Procedure: Protocol guidelines were followed. The procedure needle was introduced through the skin, ipsilateral to the reported pain, and advanced to the target area. Bone was contacted and the needle walked caudad, until the lamina was cleared. The epidural space was identified using loss-of-resistance technique with 2-3 ml of PF-NaCl (0.9% NSS), in a 5cc LOR glass syringe.  Vitals:   09/18/21 1037 09/18/21 1038 09/18/21 1055 09/18/21 1100  BP:  (!) 141/81 (!) 178/79 (!) 165/82  Pulse:  64    Resp:  18 13 13   Temp: (!) 97.2 F (36.2 C)     SpO2:  100% 95% 93%  Weight:  115 lb (52.2 kg)    Height:  5\' 3"  (1.6 m)      Start Time: 1054 hrs. End Time: 1100 hrs.  Imaging Guidance (Spinal):          Type of Imaging Technique: Fluoroscopy Guidance (Spinal) Indication(s): Assistance in needle guidance and placement for procedures requiring needle placement in or near specific anatomical locations not easily accessible without such assistance. Exposure Time: Please see nurses notes. Contrast: Before injecting any contrast, we confirmed that the patient did not have an allergy to iodine, shellfish, or radiological contrast. Once satisfactory needle placement was completed at the desired level, radiological contrast was injected. Contrast injected under live fluoroscopy. No  contrast complications. See chart for type and volume of contrast used. Fluoroscopic Guidance: I was personally present during the use of fluoroscopy. "Tunnel Vision Technique" used to obtain the best possible view of the target area. Parallax error corrected before commencing the procedure. "Direction-depth-direction" technique used to introduce the needle under continuous pulsed fluoroscopy. Once target was reached, antero-posterior, oblique, and lateral fluoroscopic projection used confirm needle placement in all planes. Images permanently stored in EMR. Interpretation: I personally interpreted the imaging intraoperatively. Adequate needle placement confirmed in multiple planes. Appropriate spread of contrast into desired area was observed. No evidence of afferent or efferent intravascular uptake. No intrathecal or subarachnoid spread observed. Permanent images saved into the patient's record.  Antibiotic Prophylaxis:   Anti-infectives (From admission, onward)    None      Indication(s): None identified  Post-operative Assessment:  Post-procedure Vital Signs:  Pulse/HCG Rate: 64(!) 59 Temp: (!) 97.2 F (36.2 C) Resp: 13 BP: (!) 165/82 SpO2: 93 %  EBL: None  Complications: No immediate post-treatment complications observed by team, or reported by patient.  Note: The patient tolerated the entire procedure well. A repeat set of vitals were taken after the procedure and the patient was kept under observation following institutional policy, for this type of procedure. Post-procedural neurological assessment was performed, showing return to baseline, prior to discharge. The patient was provided with post-procedure discharge instructions, including a section on how to identify potential problems. Should any problems arise concerning this procedure, the patient was given instructions to immediately contact us, at any time, without hesitation. In any case, we plan to contact the patient by  telephone for a follow-up status report regarding this interventional procedure.  Comments:  No additional relevant information.  Plan of Care  Orders:  Orders Placed This Encounter  Procedures   Lumbar Epidural Injection  Scheduling Instructions:     Procedure: Interlaminar LESI T12-L1     Laterality: Right     Sedation: No Sedation     Timeframe: Today    Order Specific Question:   Where will this procedure be performed?    Answer:   [facility]   DG [facility] C-ARM 1-60 MIN NO REPORT    Intraoperative interpretation by procedural physician at [facility].    Standing Status:   Standing    Number of Occurrences:   1    Order Specific Question:   Reason for exam:    Answer:   Assistance in needle guidance and placement for procedures requiring needle placement in or near specific anatomical locations not easily accessible without such assistance.   Informed Consent Details: Physician/Practitioner Attestation; Transcribe to consent form and obtain patient signature    Note: Always confirm laterality of pain with Ms. Sudol, before procedure. Transcribe to consent form and obtain patient signature.    Order Specific Question:   Physician/Practitioner attestation of informed consent for procedure/surgical case    Answer:   I, the physician/practitioner, attest that I have discussed with the patient the benefits, risks, side effects, alternatives, likelihood of achieving goals and potential problems during recovery for the procedure that I have provided informed consent.    Order Specific Question:   Procedure    Answer:   Lumbar epidural steroid injection under fluoroscopic guidance    Order Specific Question:   Physician/Practitioner performing the procedure    Answer:   Oluchi Pucci A. Dossie Arbour, MD    Order Specific Question:   Indication/Reason    Answer:   Low back and/or lower extremity pain secondary to lumbar radiculitis   Provide equipment / supplies at  bedside    "Epidural Tray" (Disposable   single use) Catheter: NOT required    Standing Status:   Standing    Number of Occurrences:   1    Order Specific Question:   Specify    Answer:   Epidural Tray   Chronic Opioid Analgesic:  Tramadol 50 mg, 1 tab PO q 8 hrs (150 mg/day of tramadol) MME/day: 15 mg/day.   Medications ordered for procedure: Meds ordered this encounter  Medications   iohexol (OMNIPAQUE) 180 MG/ML injection 10 mL    Must be Myelogram-compatible. If not available, you may substitute with a water-soluble, non-ionic, hypoallergenic, myelogram-compatible radiological contrast medium.   lidocaine (XYLOCAINE) 2 % (with pres) injection 400 mg   pentafluoroprop-tetrafluoroeth (GEBAUERS) aerosol   sodium chloride flush (NS) 0.9 % injection 2 mL   ropivacaine (PF) 2 mg/mL (0.2%) (NAROPIN) injection 2 mL   triamcinolone acetonide (KENALOG-40) injection 40 mg   Medications administered: We administered iohexol, lidocaine, pentafluoroprop-tetrafluoroeth, sodium chloride flush, ropivacaine (PF) 2 mg/mL (0.2%), and triamcinolone acetonide.  See the medical record for exact dosing, route, and time of administration.  Follow-up plan:   Return in about 2 weeks (around 10/02/2021) for Proc-day (T,Th), (VV), (PPE).        Interventional Therapies  Risk   Complexity Considerations:   Estimated body mass index is 20.19 kg/m as calculated from the following:   Height as of this encounter: 5\' 3"  (1.6 m).   Weight as of this encounter: 114 lb (51.7 kg). Note: Hard of hearing   Planned   Pending:      Under consideration:   Diagnostic right hip joint injection  Diagnostic right trochanteric bursa injection  Diagnostic midline caudal ESI (for the tailbone pain)  Completed:   Palliative right T12-L1 LESI x5 (04/24/2021)  Therapeutic right L2-3 LESI x1 (03/07/2020)  Diagnostic/therapeutic bilateral SI joint injection x1 (02/10/2020)  Palliative right lumbar facet block x13  (01/04/2020)  Palliative left lumbar facet block x11 (01/04/2020)    Therapeutic   Palliative (PRN) options:   Palliative T12-L1 LESI      Recent Visits Date Type Provider Dept  07/26/21 Office Visit Milinda Pointer, MD [facility]  07/10/21 Procedure visit Milinda Pointer, MD [facility]  07/04/21 Office Visit Milinda Pointer, MD [facility]  Showing recent visits within past 90 days and meeting all other requirements Today's Visits Date Type Provider Dept  09/18/21 Procedure visit Milinda Pointer, MD [facility]  Showing today's visits and meeting all other requirements Future Appointments Date Type Provider Dept  10/03/21 Appointment Milinda Pointer, MD [facility]  Showing future appointments within next 90 days and meeting all other requirements  Disposition: Discharge home  Discharge (Date   Time): 09/18/2021;   hrs.   Primary Care Physician: Olin Hauser, DO Location: [facility] Note by: Gaspar Cola, MD Date: 09/18/2021; Time: 11:05 AM  Disclaimer:  Medicine is not an Chief Strategy Officer. The only guarantee in medicine is that nothing is guaranteed. It is important to note that the decision to proceed with this intervention was based on the information collected from the patient. The Data and conclusions were drawn from the patient's questionnaire, the interview, and the physical examination. Because the information was provided in large part by the patient, it cannot be guaranteed that it has not been purposely or unconsciously manipulated. Every effort has been made to obtain as much relevant data as possible for this evaluation. It is important to note that the conclusions that lead to this procedure are derived in large part from the available data. Always take into account that the treatment will also be dependent on availability of resources and existing treatment  guidelines, considered by other Pain Management Practitioners as being common knowledge and practice, at the time of the intervention. For Medico-Legal purposes, it is also important to point out that variation in procedural techniques and pharmacological choices are the acceptable norm. The indications, contraindications, technique, and results of the above procedure should only be interpreted and judged by a Board-Certified Interventional Pain Specialist with extensive familiarity and expertise in the same exact procedure and technique.

## 2021-09-18 ENCOUNTER — Other Ambulatory Visit: Payer: Self-pay

## 2021-09-18 ENCOUNTER — Encounter: Payer: Self-pay | Admitting: Pain Medicine

## 2021-09-18 ENCOUNTER — Ambulatory Visit (HOSPITAL_BASED_OUTPATIENT_CLINIC_OR_DEPARTMENT_OTHER): Payer: Medicare Other | Admitting: Pain Medicine

## 2021-09-18 ENCOUNTER — Ambulatory Visit
Admission: RE | Admit: 2021-09-18 | Discharge: 2021-09-18 | Disposition: A | Discharge status: Home / Self Care | Payer: Medicare Other | Source: Ambulatory Visit | Attending: Pain Medicine | Admitting: Pain Medicine

## 2021-09-18 VITALS — BP 165/82 | HR 64 | Temp 97.2°F | Resp 13 | Ht 63.0 in | Wt 115.0 lb

## 2021-09-18 DIAGNOSIS — M5416 Radiculopathy, lumbar region: Secondary | ICD-10-CM | POA: Insufficient documentation

## 2021-09-18 DIAGNOSIS — G8929 Other chronic pain: Secondary | ICD-10-CM

## 2021-09-18 DIAGNOSIS — M48061 Spinal stenosis, lumbar region without neurogenic claudication: Secondary | ICD-10-CM

## 2021-09-18 DIAGNOSIS — M5137 Other intervertebral disc degeneration, lumbosacral region: Secondary | ICD-10-CM

## 2021-09-18 DIAGNOSIS — M545 Low back pain, unspecified: Secondary | ICD-10-CM | POA: Diagnosis not present

## 2021-09-18 DIAGNOSIS — M48062 Spinal stenosis, lumbar region with neurogenic claudication: Secondary | ICD-10-CM | POA: Insufficient documentation

## 2021-09-18 DIAGNOSIS — M4317 Spondylolisthesis, lumbosacral region: Secondary | ICD-10-CM

## 2021-09-18 DIAGNOSIS — S22080S Wedge compression fracture of T11-T12 vertebra, sequela: Secondary | ICD-10-CM | POA: Insufficient documentation

## 2021-09-18 MED ORDER — ROPIVACAINE HCL 2 MG/ML IJ SOLN
INTRAMUSCULAR | Status: AC
  Filled 2021-09-18: qty 20

## 2021-09-18 MED ORDER — IOHEXOL 180 MG/ML  SOLN
10.0000 mL | Freq: Once | INTRAMUSCULAR | Status: AC
  Administered 2021-09-18: 5 mL via EPIDURAL

## 2021-09-18 MED ORDER — PENTAFLUOROPROP-TETRAFLUOROETH EX AERO
INHALATION_SPRAY | Freq: Once | CUTANEOUS | Status: AC
  Administered 2021-09-18: 30 via TOPICAL
  Filled 2021-09-18: qty 116

## 2021-09-18 MED ORDER — SODIUM CHLORIDE 0.9% FLUSH
2.0000 mL | Freq: Once | INTRAVENOUS | Status: AC
  Administered 2021-09-18: 10 mL

## 2021-09-18 MED ORDER — SODIUM CHLORIDE (PF) 0.9 % IJ SOLN
INTRAMUSCULAR | Status: AC
  Filled 2021-09-18: qty 10

## 2021-09-18 MED ORDER — TRIAMCINOLONE ACETONIDE 40 MG/ML IJ SUSP
40.0000 mg | Freq: Once | INTRAMUSCULAR | Status: AC
  Administered 2021-09-18: 40 mg

## 2021-09-18 MED ORDER — ROPIVACAINE HCL 2 MG/ML IJ SOLN
2.0000 mL | Freq: Once | INTRAMUSCULAR | Status: AC
  Administered 2021-09-18: 20 mL via EPIDURAL

## 2021-09-18 MED ORDER — TRIAMCINOLONE ACETONIDE 40 MG/ML IJ SUSP
INTRAMUSCULAR | Status: AC
  Filled 2021-09-18: qty 1

## 2021-09-18 MED ORDER — LIDOCAINE HCL 2 % IJ SOLN
20.0000 mL | Freq: Once | INTRAMUSCULAR | Status: AC
  Administered 2021-09-18: 400 mg
  Filled 2021-09-18: qty 20

## 2021-09-18 NOTE — Patient Instructions (Signed)
____________________________________________________________________________________________  Virtual Visits   What is a "Virtual Visit"? It is a healthcare communication encounter (medical visit) that takes place on real time (NOT TEXT or E-MAIL) over the telephone or computer device (desktop, laptop, tablet, smart phone, etc.). It allows for more location flexibility between the patient and the healthcare provider.  Who decides when these types of visits will be used? The physician.  Who is eligible for these types of visits? Only those patients that can be reliably reached over the telephone.  What do you mean by reliably? We do not have time to call everyone multiple times, therefore those that tend to screen calls and then call back later are not suitable candidates for this system. We understand how people are reluctant to pickup on "unknown" calls, therefore, we suggest adding our telephone numbers to your list of "CONTACT(s)". This way, you should be able to readily identify our calls when you receive one. All of our numbers are available below.   Who is not eligible? This option is not available for medication management encounters, specially for controlled substances. Patients on pain medications that fall under the category of controlled substances have to come in for "Face-to-Face" encounters. This is required for mandatory monitoring of these substances. You may be asked to provide a sample for an unannounced urine drug screening test (UDS), and we will need to count your pain pills. Not bringing your pills to be counted may result in no refill. Obviously, neither one of these can be done over the phone.  When will this type of visits be used? You can request a virtual visit whenever you are physically unable to attend a regular appointment. The decision will be made by the physician (or healthcare provider) on a case by case basis.   At what time will I be called? This is an  excellent question. The providers will try to call you whenever they have time available. Do not expect to be called at any specific time. The secretaries will assign you a time for your virtual visit appointment, but this is done simply to keep a list of those patients that need to be called, but not for the purpose of keeping a time schedule. Be advised that the call may come in anytime during the day, between the hours of 8:00 AM and 8::00 PM, depending on provider availability. We do understand that the system is not perfect. If you are unable to be available that day on a moments notice, then request an "in-person" appointment rather than a "virtual visit".  Can I request my medication visits to be "Virtual"? Yes you may request it, but the decision is entirely up to the healthcare provider. Control substances require specific monitoring that requires Face-to-Face encounters. The number of encounters  and the extent of the monitoring is determined on a case by case basis.  Add a new contact to your smart phone and label it "PAIN CLINIC" Under this contact add the following numbers: Main: [phone] (Official Contact Number) Nurses: [phone] (These are outgoing only calling systems. Do not call this number.) Dr. Fynley Chrystal: [phone] or [phone] (Outgoing calls only. Do not call this number.)  ____________________________________________________________________________________________   ____________________________________________________________________________________________  Post-Procedure Discharge Instructions  Instructions: Apply ice:  Purpose: This will minimize any swelling and discomfort after procedure.  When: Day of procedure, as soon as you get home. How: Fill a plastic sandwich bag with crushed ice. Cover it with a small towel and apply to   injection site. How long: (15 min on, 15 min off) Apply for 15 minutes then remove x 15 minutes.  Repeat sequence on day  of procedure, until you go to bed. Apply heat:  Purpose: To treat any soreness and discomfort from the procedure. When: Starting the next day after the procedure. How: Apply heat to procedure site starting the day following the procedure. How long: May continue to repeat daily, until discomfort goes away. Food intake: Start with clear liquids (like water) and advance to regular food, as tolerated.  Physical activities: Keep activities to a minimum for the first 8 hours after the procedure. After that, then as tolerated. Driving: If you have received any sedation, be responsible and do not drive. You are not allowed to drive for 24 hours after having sedation. Blood thinner: (Applies only to those taking blood thinners) You may restart your blood thinner 6 hours after your procedure. Insulin: (Applies only to Diabetic patients taking insulin) As soon as you can eat, you may resume your normal dosing schedule. Infection prevention: Keep procedure site clean and dry. Shower daily and clean area with soap and water. Post-procedure Pain Diary: Extremely important that this be done correctly and accurately. Recorded information will be used to determine the next step in treatment. For the purpose of accuracy, follow these rules: Evaluate only the area treated. Do not report or include pain from an untreated area. For the purpose of this evaluation, ignore all other areas of pain, except for the treated area. After your procedure, avoid taking a long nap and attempting to complete the pain diary after you wake up. Instead, set your alarm clock to go off every hour, on the hour, for the initial 8 hours after the procedure. Document the duration of the numbing medicine, and the relief you are getting from it. Do not go to sleep and attempt to complete it later. It will not be accurate. If you received sedation, it is likely that you were given a medication that may cause amnesia. Because of this, completing  the diary at a later time may cause the information to be inaccurate. This information is needed to plan your care. Follow-up appointment: Keep your post-procedure follow-up evaluation appointment after the procedure (usually 2 weeks for most procedures, 6 weeks for radiofrequencies). DO NOT FORGET to bring you pain diary with you.   Expect: (What should I expect to see with my procedure?) From numbing medicine (AKA: Local Anesthetics): Numbness or decrease in pain. You may also experience some weakness, which if present, could last for the duration of the local anesthetic. Onset: Full effect within 15 minutes of injected. Duration: It will depend on the type of local anesthetic used. On the average, 1 to 8 hours.  From steroids (Applies only if steroids were used): Decrease in swelling or inflammation. Once inflammation is improved, relief of the pain will follow. Onset of benefits: Depends on the amount of swelling present. The more swelling, the longer it will take for the benefits to be seen. In some cases, up to 10 days. Duration: Steroids will stay in the system x 2 weeks. Duration of benefits will depend on multiple posibilities including persistent irritating factors. Side-effects: If present, they may typically last 2 weeks (the duration of the steroids). Frequent: Cramps (if they occur, drink Gatorade and take over-the-counter Magnesium 450-500 mg once to twice a day); water retention with temporary weight gain; increases in blood sugar; decreased immune system response; increased appetite. Occasional: Facial flushing (red,   warm cheeks); mood swings; menstrual changes. Uncommon: Long-term decrease or suppression of natural hormones; bone thinning. (These are more common with higher doses or more frequent use. This is why we prefer that our patients avoid having any injection therapies in other practices.)  Very Rare: Severe mood changes; psychosis; aseptic necrosis. From procedure: Some  discomfort is to be expected once the numbing medicine wears off. This should be minimal if ice and heat are applied as instructed.  Call if: (When should I call?) You experience numbness and weakness that gets worse with time, as opposed to wearing off. New onset bowel or bladder incontinence. (Applies only to procedures done in the spine)  Emergency Numbers: Durning business hours (Monday - Thursday, 8:00 AM - 4:00 PM) (Friday, 9:00 AM - 12:00 Noon): [phone] After hours: [phone] NOTE: If you are having a problem and are unable connect with, or to talk to a provider, then go to your nearest urgent care or emergency department. If the problem is serious and urgent, please call 911. ____________________________________________________________________________________________   

## 2021-09-18 NOTE — Progress Notes (Signed)
Safety precautions to be maintained throughout the outpatient stay will include: orient to surroundings, keep bed in low position, maintain call bell within reach at all times, provide assistance with transfer out of bed and ambulation.  

## 2021-09-19 ENCOUNTER — Telehealth: Payer: Self-pay

## 2021-09-19 NOTE — Telephone Encounter (Signed)
Post procedure phone call. Patients daughter will call her and check on her and clal [place] back if needed.

## 2021-09-20 ENCOUNTER — Other Ambulatory Visit: Payer: Self-pay | Admitting: Pain Medicine

## 2021-09-20 DIAGNOSIS — Z79891 Long term (current) use of opiate analgesic: Secondary | ICD-10-CM

## 2021-09-20 DIAGNOSIS — M533 Sacrococcygeal disorders, not elsewhere classified: Secondary | ICD-10-CM

## 2021-09-20 DIAGNOSIS — S22080S Wedge compression fracture of T11-T12 vertebra, sequela: Secondary | ICD-10-CM

## 2021-09-20 DIAGNOSIS — Z79899 Other long term (current) drug therapy: Secondary | ICD-10-CM

## 2021-09-20 DIAGNOSIS — M47816 Spondylosis without myelopathy or radiculopathy, lumbar region: Secondary | ICD-10-CM

## 2021-09-20 DIAGNOSIS — M5135 Other intervertebral disc degeneration, thoracolumbar region: Secondary | ICD-10-CM

## 2021-09-20 DIAGNOSIS — M51379 Other intervertebral disc degeneration, lumbosacral region without mention of lumbar back pain or lower extremity pain: Secondary | ICD-10-CM

## 2021-09-20 DIAGNOSIS — M25551 Pain in right hip: Secondary | ICD-10-CM

## 2021-09-20 DIAGNOSIS — M5137 Other intervertebral disc degeneration, lumbosacral region: Secondary | ICD-10-CM

## 2021-09-20 DIAGNOSIS — M79604 Pain in right leg: Secondary | ICD-10-CM

## 2021-09-20 DIAGNOSIS — M4854XS Collapsed vertebra, not elsewhere classified, thoracic region, sequela of fracture: Secondary | ICD-10-CM

## 2021-09-20 DIAGNOSIS — G894 Chronic pain syndrome: Secondary | ICD-10-CM

## 2021-09-20 DIAGNOSIS — G8929 Other chronic pain: Secondary | ICD-10-CM

## 2021-09-20 DIAGNOSIS — M545 Low back pain, unspecified: Secondary | ICD-10-CM

## 2021-10-02 ENCOUNTER — Telehealth: Payer: Medicare Other | Admitting: Pain Medicine

## 2021-10-02 NOTE — Progress Notes (Signed)
PROVIDER NOTE: Information contained herein reflects review and annotations entered in association with encounter. Interpretation of such information and data should be left to medically-trained personnel. Information provided to patient can be located elsewhere in the medical record under "Patient Instructions". Document created using STT-dictation technology, any transcriptional errors that may result from process are unintentional.    Patient: Brittney Simpson  Service Category: E/M  Provider: Gaspar Cola, MD  DOB: Jan 15, 1928  DOS: 10/03/2021  Specialty: Interventional Pain Management  MRN: [id number]  Setting: Ambulatory outpatient  PCP: Olin Hauser, DO  Type: Established Patient    Referring Provider: Nobie Putnam *  Location: Office  Delivery: Face-to-face     HPI  Brittney Simpson, a 88 y.o. year old female, is here today because of her No primary diagnosis found.. Brittney Simpson's primary complain today is Back Pain and Hip Pain (right) Last encounter: My last encounter with her was on 09/20/2021. Pertinent problems: Brittney Simpson has Lumbar facet syndrome (Bilateral) (R>L); Chronic low back pain (1ry area of Pain) (Bilateral) (R>L) w/o sciatica; Spondylolisthesis of lumbosacral region (L2-3 and L5-S1); Lumbar spinal stenosis (9 mm at L3-4); Trochanteric bursitis (Right); Chronic hip pain (2ry area of Pain) (Right); Osteoarthritis of hip (Right); Chronic sacroiliac joint pain (Bilateral) (R>L); Chronic lumbar radicular pain (Right); Chronic lower extremity pain (3ry area of Pain) (Right); Myofascial pain; Spondylosis without myelopathy or radiculopathy, lumbosacral region; Ankle edema, bilateral; Abnormal MRI, lumbar spine (01/10/2017); T12 compression fracture, sequela; Lumbar facet hypertrophy; Lumbar lateral recess stenosis; Lumbar foraminal stenosis; Non-traumatic compression fracture of T7 thoracic vertebra, sequela; Chronic pain syndrome; DDD (degenerative disc  disease), lumbosacral; Edema of lower extremity; Somatic dysfunction of sacroiliac joints (Bilateral); Other spondylosis, sacral and sacrococcygeal region; Spasm of back muscles; and DDD (degenerative disc disease), thoracolumbar on their pertinent problem list. Pain Assessment: Severity of Chronic pain is reported as a 4 /10. Location: Back Lower/radiates into right hip. Onset: More than a month ago. Quality: Aching. Timing: Constant. Modifying factor(s): procedures, meds. Vitals:  height is '5\' 3"'  (1.6 m) and weight is 115 lb (52.2 kg). Her temperature is 96.7 F (35.9 C) (abnormal). Her blood pressure is 168/81 (abnormal) and her pulse is 64. Her respiration is 18 and oxygen saturation is 99%.   Reason for encounter: medication management.   The patient indicates doing well with the current medication regimen. No adverse reactions or side effects reported to the medications.   Regarding her recent lumbar epidural steroid injection he seems to have completely eliminated the pain on the left lower back, but she still has about 50% of her original pain on the right side.  She refers that is still much better than it was and currently she is not needed any type of interventional therapies.  RTCB: 04/19/2022 Nonopioids transferred 04/11/2020: Robaxin  Post-procedure evaluation    Type: Lumbar epidural steroid injection (LESI) (interlaminar)          Laterality: Right   Level:   T12-L1  Level.  Imaging: Fluoroscopic guidance Anesthesia: Local anesthesia (1-2% Lidocaine) Anxiolysis: None                 Sedation: None. DOS: 09/18/2021  Performed by: Gaspar Cola, MD  Purpose: Diagnostic/Therapeutic Indications: Lumbar radicular pain of intraspinal etiology of more than 4 weeks that has failed to respond to conservative therapy and is severe enough to impact quality of life or function. 1. Chronic low back pain (1ry area of Pain) (Bilateral) (R>L) w/o sciatica  2. Chronic lumbar radicular  pain (Right)   3. DDD (degenerative disc disease), lumbosacral   4. Lumbar foraminal stenosis   5. Lumbar lateral recess stenosis   6. Spinal stenosis of lumbar region with neurogenic claudication   7. Spondylolisthesis of lumbosacral region (L2-3 and L5-S1)   8. T12 compression fracture, sequela    NAS-11 Pain score:   Pre-procedure: 8 /10   Post-procedure: 5 /10      Effectiveness:  Initial hour after procedure: 50 %. Subsequent 4-6 hours post-procedure: 75 %. Analgesia past initial 6 hours: 50 % (left hip pain is gone.  Continues to have back and right hip pain). Ongoing improvement:  Analgesic: She refers having attained an ongoing 100% relief of her "left hip pain".  In reality when she says that her hip is hurting, she points that the area of the lower back (left PSIS region).  In the case of the right side, she still having pain that she refers goes from the center towards the right PSIS.  She refers that currently is still much better than it was before and she does not need any interventions at this time. Function: Brittney Simpson reports improvement in function ROM: Brittney Simpson reports improvement in ROM  Pharmacotherapy Assessment  Analgesic: Tramadol 50 mg, 1 tab PO q 8 hrs (150 mg/day of tramadol) MME/day: 15 mg/day.   Monitoring: [place] PMP: PDMP reviewed during this encounter.       Pharmacotherapy: No side-effects or adverse reactions reported. Compliance: No problems identified. Effectiveness: Clinically acceptable.  Dewayne Shorter, RN  10/03/2021 10:57 AM  Sign when Signing Visit Nursing Pain Medication Assessment:  Safety precautions to be maintained throughout the outpatient stay will include: orient to surroundings, keep bed in low position, maintain call bell within reach at all times, provide assistance with transfer out of bed and ambulation.  Medication Inspection Compliance: Pill count conducted under aseptic conditions, in front of the patient. Neither the pills  nor the bottle was removed from the patient's sight at any time. Once count was completed pills were immediately returned to the patient in their original bottle.  Medication: Tramadol (Ultram) Pill/Patch Count:  47 of 90 pills remain Pill/Patch Appearance: Markings consistent with prescribed medication Bottle Appearance: Standard pharmacy container. Clearly labeled. Filled Date: 02 / 23 / 2023 Last Medication intake:  Today    UDS:  Summary  Date Value Ref Range Status  01/24/2021 Note  Final    Comment:    ==================================================================== [employer] Select 13 (MW) ==================================================================== Specimen Alert Note: Urinary creatinine is low; ability to detect some drugs may be compromised. Interpret results with caution. (Creatinine) ==================================================================== Test                             Result       Flag       Units  Drug Present and Declared for Prescription Verification   7-aminoclonazepam              287          EXPECTED   ng/mg creat    7-aminoclonazepam is an expected metabolite of clonazepam. Source of    clonazepam is a scheduled prescription medication.    Tramadol                       >33333       EXPECTED   ng/mg creat   O-Desmethyltramadol  21013        EXPECTED   ng/mg creat   N-Desmethyltramadol            7287         EXPECTED   ng/mg creat    Source of tramadol is a prescription medication. O-desmethyltramadol    and N-desmethyltramadol are expected metabolites of tramadol.  ==================================================================== Test                      Result    Flag   Units      Ref Range   Creatinine              15        LL     mg/dL      >=20 ==================================================================== Declared Medications:  The flagging and interpretation on this report are based on the  following  declared medications.  Unexpected results may arise from  inaccuracies in the declared medications.   **Note: The testing scope of this panel includes these medications:   Clonazepam (Klonopin)  Tramadol (Ultram)   **Note: The testing scope of this panel does not include the  following reported medications:   Acetaminophen (Tylenol)  Amlodipine  Cranberry  Furosemide  Latanoprost (Xalatan)  Levothyroxine  Lisinopril  Melatonin  Metoprolol  Omeprazole  Sulfamethoxazole (Bactrim)  Tamsulosin (Flomax)  Trazodone  Trimethoprim (Bactrim)  Vitamin D2 ==================================================================== For clinical consultation, please call [phone]. ====================================================================      ROS  Constitutional: Denies any fever or chills Gastrointestinal: No reported hemesis, hematochezia, vomiting, or acute GI distress Musculoskeletal: Denies any acute onset joint swelling, redness, loss of ROM, or weakness Neurological: No reported episodes of acute onset apraxia, aphasia, dysarthria, agnosia, amnesia, paralysis, loss of coordination, or loss of consciousness  Medication Review  Cranberry, Melatonin, Vitamin D3, acetaminophen, amLODipine, clonazePAM, furosemide, ibuprofen, latanoprost, levothyroxine, lisinopril, methocarbamol, metoprolol succinate, omeprazole, sulfamethoxazole-trimethoprim, tamsulosin, traMADol, and traZODone  History Review  Allergy: Ms. Niday has No Known Allergies. Drug: Ms. Spadoni  reports no history of drug use. Alcohol:  reports no history of alcohol use. Tobacco:  reports that she has never smoked. She has never used smokeless tobacco. Social: Ms. Minogue  reports that she has never smoked. She has never used smokeless tobacco. She reports that she does not drink alcohol and does not use drugs. Medical:  has a past medical history of Anxiety, Arthritis, Back ache, Hiatal hernia,  Hyperlipidemia, Hypertension, Hypothyroid, Osteopenia, Thyroid disease, and UTI (urinary tract infection). Surgical: Ms. Maudlin  has a past surgical history that includes Hernia repair; Hernia repair; Hemorrhoid surgery; Kyphoplasty (N/A, 01/23/2017); Kyphoplasty (N/A, 01/25/2020); and Cholecystectomy. Family: family history includes Cancer in her father; Stroke in her mother.  Laboratory Chemistry Profile   Renal Lab Results  Component Value Date   BUN 8 03/07/2021   CREATININE 0.75 54/65/6812   BCR NOT APPLICABLE 75/17/0017   GFRAA 76 05/16/2020   GFRNONAA 66 05/16/2020    Hepatic Lab Results  Component Value Date   AST 10 03/07/2021   ALT 7 03/07/2021   ALBUMIN 3.6 11/22/2016   ALKPHOS 59 11/22/2016   LIPASE 21 06/14/2016    Electrolytes Lab Results  Component Value Date   NA 135 03/07/2021   K 3.9 03/07/2021   CL 95 (L) 03/07/2021   CALCIUM 9.6 03/07/2021   MG 2.1 01/14/2014    Bone No results found for: VD25OH, VD125OH2TOT, CB4496PR9, FM3846KZ9, 25OHVITD1, 25OHVITD2, 25OHVITD3, TESTOFREE, TESTOSTERONE  Inflammation (CRP: Acute Phase) (ESR:  Chronic Phase) No results found for: CRP, ESRSEDRATE, LATICACIDVEN       Note: Above Lab results reviewed.  Recent Imaging Review  DG PAIN CLINIC C-ARM 1-60 MIN NO REPORT Fluoro was used, but no Radiologist interpretation will be provided.  Please refer to "NOTES" tab for provider progress note. Note: Reviewed        Physical Exam  General appearance: Well nourished, well developed, and well hydrated. In no apparent acute distress Mental status: Alert, oriented x 3 (person, place, & time)       Respiratory: No evidence of acute respiratory distress Eyes: PERLA Vitals: BP (!) 168/81    Pulse 64    Temp (!) 96.7 F (35.9 C)    Resp 18    Ht '5\' 3"'  (1.6 m)    Wt 115 lb (52.2 kg)    SpO2 99%    BMI 20.37 kg/m  BMI: Estimated body mass index is 20.37 kg/m as calculated from the following:   Height as of this encounter: '5\' 3"'   (1.6 m).   Weight as of this encounter: 115 lb (52.2 kg). Ideal: Ideal body weight: 52.4 kg (115 lb 8.3 oz)  Assessment   Status Diagnosis  Controlled Controlled Controlled 1. Chronic low back pain (1ry area of Pain) (Bilateral) (R>L) w/o sciatica   2. Chronic hip pain (2ry area of Pain) (Right)   3. Chronic lower extremity pain (3ry area of Pain) (Right)   4. DDD (degenerative disc disease), lumbosacral   5. Chronic sacroiliac joint pain (Bilateral) (R>L)   6. Lumbar facet syndrome (Bilateral) (R>L)   7. Non-traumatic compression fracture of T7 thoracic vertebra, sequela   8. T12 compression fracture, sequela   9. DDD (degenerative disc disease), thoracolumbar   10. Chronic pain syndrome   11. Pharmacologic therapy   12. Chronic use of opiate for therapeutic purpose   13. Encounter for chronic pain management   14. Encounter for medication management      Updated Problems: No problems updated.  Plan of Care  Problem-specific:  No problem-specific Assessment & Plan notes found for this encounter.  Ms. OLUKEMI PANCHAL has a current medication list which includes the following long-term medication(s): amlodipine, clonazepam, furosemide, levothyroxine, lisinopril, methocarbamol, metoprolol succinate, omeprazole, [START ON 10/21/2021] tramadol, and trazodone.  Pharmacotherapy (Medications Ordered): Meds ordered this encounter  Medications   traMADol (ULTRAM) 50 MG tablet    Sig: Take 1 tablet (50 mg total) by mouth 3 (three) times daily. Each refill must last 30 days.    Dispense:  90 tablet    Refill:  5    DO NOT: delete (not duplicate); no partial-fill (will deny script to complete), no refill request (F/U required). DISPENSE: 1 day early if closed on fill date. WARN: No CNS-depressants within 8 hrs of med.   Orders:  No orders of the defined types were placed in this encounter.  Follow-up plan:   Return in about 7 months (around 04/19/2022) for Eval-day (M,W), (F2F),  (MM).     Interventional Therapies  Risk   Complexity Considerations:   Estimated body mass index is 20.19 kg/m as calculated from the following:   Height as of this encounter: '5\' 3"'  (1.6 m).   Weight as of this encounter: 114 lb (51.7 kg). Note: Hard of hearing   Planned   Pending:      Under consideration:   Diagnostic right hip joint injection  Diagnostic right trochanteric bursa injection  Diagnostic midline caudal ESI (for the tailbone  pain)   Completed:   Palliative right T12-L1 LESI x5 (04/24/2021) (50/75/50/R: 50%   L: 100%)  Therapeutic right L2-3 LESI x1 (03/07/2020) (100/100/25/<50)  Diagnostic/therapeutic bilateral SI joint injection x1 (02/10/2020) (100/100/100 x2 days/0)  Palliative right lumbar facet MBB x13 (01/04/2020) (8 to 0) (100/100/100)  Palliative left lumbar facet MBB x11 (01/04/2020) (8 to 0) (100/100/100)    Therapeutic   Palliative (PRN) options:   Palliative T12-L1 LESI (seems to be better for the left LBP)  Therapeutic/palliative lumbar facet MBB       Recent Visits Date Type Provider Dept  09/18/21 Procedure visit Milinda Pointer, MD [facility]  07/26/21 Office Visit Milinda Pointer, MD [facility]  07/10/21 Procedure visit Milinda Pointer, MD [facility]  Showing recent visits within past 90 days and meeting all other requirements Today's Visits Date Type Provider Dept  10/03/21 Office Visit Milinda Pointer, MD [facility]  Showing today's visits and meeting all other requirements Future Appointments No visits were found meeting these conditions. Showing future appointments within next 90 days and meeting all other requirements  I discussed the assessment and treatment plan with the patient. The patient was provided an opportunity to ask questions and all were answered. The patient agreed with the plan and demonstrated an understanding of the instructions.  Patient advised to call back or seek  an in-person evaluation if the symptoms or condition worsens.  Duration of encounter: 30 minutes.  Note by: Gaspar Cola, MD Date: 10/03/2021; Time: 11:29 AM

## 2021-10-03 ENCOUNTER — Ambulatory Visit: Payer: Medicare Other | Attending: Pain Medicine | Admitting: Pain Medicine

## 2021-10-03 ENCOUNTER — Encounter: Payer: Self-pay | Admitting: Pain Medicine

## 2021-10-03 ENCOUNTER — Other Ambulatory Visit: Payer: Self-pay

## 2021-10-03 DIAGNOSIS — M79604 Pain in right leg: Secondary | ICD-10-CM | POA: Insufficient documentation

## 2021-10-03 DIAGNOSIS — M5137 Other intervertebral disc degeneration, lumbosacral region: Secondary | ICD-10-CM | POA: Diagnosis not present

## 2021-10-03 DIAGNOSIS — M51379 Other intervertebral disc degeneration, lumbosacral region without mention of lumbar back pain or lower extremity pain: Secondary | ICD-10-CM

## 2021-10-03 DIAGNOSIS — Z79891 Long term (current) use of opiate analgesic: Secondary | ICD-10-CM

## 2021-10-03 DIAGNOSIS — M533 Sacrococcygeal disorders, not elsewhere classified: Secondary | ICD-10-CM | POA: Diagnosis not present

## 2021-10-03 DIAGNOSIS — M545 Low back pain, unspecified: Secondary | ICD-10-CM | POA: Diagnosis not present

## 2021-10-03 DIAGNOSIS — M5135 Other intervertebral disc degeneration, thoracolumbar region: Secondary | ICD-10-CM

## 2021-10-03 DIAGNOSIS — G8929 Other chronic pain: Secondary | ICD-10-CM | POA: Diagnosis not present

## 2021-10-03 DIAGNOSIS — M25551 Pain in right hip: Secondary | ICD-10-CM | POA: Diagnosis not present

## 2021-10-03 DIAGNOSIS — S22080S Wedge compression fracture of T11-T12 vertebra, sequela: Secondary | ICD-10-CM

## 2021-10-03 DIAGNOSIS — G894 Chronic pain syndrome: Secondary | ICD-10-CM | POA: Diagnosis not present

## 2021-10-03 DIAGNOSIS — Z79899 Other long term (current) drug therapy: Secondary | ICD-10-CM

## 2021-10-03 DIAGNOSIS — M47816 Spondylosis without myelopathy or radiculopathy, lumbar region: Secondary | ICD-10-CM

## 2021-10-03 DIAGNOSIS — M4854XS Collapsed vertebra, not elsewhere classified, thoracic region, sequela of fracture: Secondary | ICD-10-CM

## 2021-10-03 MED ORDER — TRAMADOL HCL 50 MG PO TABS: 50.0000 mg | ORAL_TABLET | Freq: Three times a day (TID) | ORAL | 5 refills | Status: DC

## 2021-10-03 NOTE — Patient Instructions (Signed)
____________________________________________________________________________________________ ° °Medication Rules ° °Purpose: To inform patients, and their family members, of our rules and regulations. ° °Applies to: All patients receiving prescriptions (written or electronic). ° °Pharmacy of record: Pharmacy where electronic prescriptions will be sent. If written prescriptions are taken to a different pharmacy, please inform the nursing staff. The pharmacy listed in the electronic medical record should be the one where you would like electronic prescriptions to be sent. ° °Electronic prescriptions: In compliance with the [employer] Strengthen Opioid Misuse Prevention (STOP) Act of 2017 (Session Law 2017-74/H243), effective July 29, 2018, all controlled substances must be electronically prescribed. Calling prescriptions to the pharmacy will cease to exist. ° °Prescription refills: Only during scheduled appointments. Applies to all prescriptions. ° °NOTE: The following applies primarily to controlled substances (Opioid* Pain Medications).  ° °Type of encounter (visit): For patients receiving controlled substances, face-to-face visits are required. (Not an option or up to the patient.) ° °Patient's responsibilities: °Pain Pills: Bring all pain pills to every appointment (except for procedure appointments). °Pill Bottles: Bring pills in original pharmacy bottle. Always bring the newest bottle. Bring bottle, even if empty. °Medication refills: You are responsible for knowing and keeping track of what medications you take and those you need refilled. °The day before your appointment: write a list of all prescriptions that need to be refilled. °The day of the appointment: give the list to the admitting nurse. Prescriptions will be written only during appointments. No prescriptions will be written on procedure days. °If you forget a medication: it will not be "Called in", "Faxed", or "electronically sent". You will  need to get another appointment to get these prescribed. °No early refills. Do not call asking to have your prescription filled early. °Prescription Accuracy: You are responsible for carefully inspecting your prescriptions before leaving our office. Have the discharge nurse carefully go over each prescription with you, before taking them home. Make sure that your name is accurately spelled, that your address is correct. Check the name and dose of your medication to make sure it is accurate. Check the number of pills, and the written instructions to make sure they are clear and accurate. Make sure that you are given enough medication to last until your next medication refill appointment. °Taking Medication: Take medication as prescribed. When it comes to controlled substances, taking less pills or less frequently than prescribed is permitted and encouraged. °Never take more pills than instructed. °Never take medication more frequently than prescribed.  °Inform other Doctors: Always inform, all of your healthcare providers, of all the medications you take. °Pain Medication from other Providers: You are not allowed to accept any additional pain medication from any other Doctor or Healthcare provider. There are two exceptions to this rule. (see below) In the event that you require additional pain medication, you are responsible for notifying us, as stated below. °Cough Medicine: Often these contain an opioid, such as codeine or hydrocodone. Never accept or take cough medicine containing these opioids if you are already taking an opioid* medication. The combination may cause respiratory failure and death. °Medication Agreement: You are responsible for carefully reading and following our Medication Agreement. This must be signed before receiving any prescriptions from our practice. Safely store a copy of your signed Agreement. Violations to the Agreement will result in no further prescriptions. (Additional copies of our  Medication Agreement are available upon request.) °Laws, Rules, & Regulations: All patients are expected to follow all Federal and State Laws, Statutes, Rules, & Regulations. Ignorance of   the Laws does not constitute a valid excuse.  °Illegal drugs and Controlled Substances: The use of illegal substances (including, but not limited to marijuana and its derivatives) and/or the illegal use of any controlled substances is strictly prohibited. Violation of this rule may result in the immediate and permanent discontinuation of any and all prescriptions being written by our practice. The use of any illegal substances is prohibited. °Adopted CDC guidelines & recommendations: Target dosing levels will be at or below 60 MME/day. Use of benzodiazepines** is not recommended. ° °Exceptions: There are only two exceptions to the rule of not receiving pain medications from other Healthcare Providers. °Exception #1 (Emergencies): In the event of an emergency (i.e.: accident requiring emergency care), you are allowed to receive additional pain medication. However, you are responsible for: As soon as you are able, call our office [phone], at any time of the day or night, and leave a message stating your name, the date and nature of the emergency, and the name and dose of the medication prescribed. In the event that your call is answered by a member of our staff, make sure to document and save the date, time, and the name of the person that took your information.  °Exception #2 (Planned Surgery): In the event that you are scheduled by another doctor or dentist to have any type of surgery or procedure, you are allowed (for a period no longer than 30 days), to receive additional pain medication, for the acute post-op pain. However, in this case, you are responsible for picking up a copy of our "Post-op Pain Management for Surgeons" handout, and giving it to your surgeon or dentist. This document is available at our office, and  does not require an appointment to obtain it. Simply go to our office during business hours (Monday-Thursday from 8:00 AM to 4:00 PM) (Friday 8:00 AM to 12:00 Noon) or if you have a scheduled appointment with us, prior to your surgery, and ask for it by name. In addition, you are responsible for: calling our office [phone], at any time of the day or night, and leaving a message stating your name, name of your surgeon, type of surgery, and date of procedure or surgery. Failure to comply with your responsibilities may result in termination of therapy involving the controlled substances. °Medication Agreement Violation. Following the above rules, including your responsibilities will help you in avoiding a Medication Agreement Violation (“Breaking your Pain Medication Contract”). ° °*Opioid medications include: morphine, codeine, oxycodone, oxymorphone, hydrocodone, hydromorphone, meperidine, tramadol, tapentadol, buprenorphine, fentanyl, methadone. °**Benzodiazepine medications include: diazepam (Valium), alprazolam (Xanax), clonazepam (Klonopine), lorazepam (Ativan), clorazepate (Tranxene), chlordiazepoxide (Librium), estazolam (Prosom), oxazepam (Serax), temazepam (Restoril), triazolam (Halcion) °(Last updated: 04/25/2021) °____________________________________________________________________________________________ ° ____________________________________________________________________________________________ ° °Medication Recommendations and Reminders ° °Applies to: All patients receiving prescriptions (written and/or electronic). ° °Medication Rules & Regulations: These rules and regulations exist for your safety and that of others. They are not flexible and neither are we. Dismissing or ignoring them will be considered "non-compliance" with medication therapy, resulting in complete and irreversible termination of such therapy. (See document titled "Medication Rules" for more details.) In all conscience,  because of safety reasons, we cannot continue providing a therapy where the patient does not follow instructions. ° °Pharmacy of record:  °Definition: This is the pharmacy where your electronic prescriptions will be sent.  °We do not endorse any particular pharmacy, however, we have experienced problems with [facility] not securing enough medication supply for the community. °We do not restrict you   in your choice of pharmacy. However, once we write for your prescriptions, we will NOT be re-sending more prescriptions to fix restricted supply problems created by your pharmacy, or your insurance.  °The pharmacy listed in the electronic medical record should be the one where you want electronic prescriptions to be sent. °If you choose to change pharmacy, simply notify our nursing staff. ° °Recommendations: °Keep all of your pain medications in a safe place, under lock and key, even if you live alone. We will NOT replace lost, stolen, or damaged medication. °After you fill your prescription, take 1 week's worth of pills and put them away in a safe place. You should keep a separate, properly labeled bottle for this purpose. The remainder should be kept in the original bottle. Use this as your primary supply, until it runs out. Once it's gone, then you know that you have 1 week's worth of medicine, and it is time to come in for a prescription refill. If you do this correctly, it is unlikely that you will ever run out of medicine. °To make sure that the above recommendation works, it is very important that you make sure your medication refill appointments are scheduled at least 1 week before you run out of medicine. To do this in an effective manner, make sure that you do not leave the office without scheduling your next medication management appointment. Always ask the nursing staff to show you in your prescription , when your medication will be running out. Then arrange for the receptionist to get you a return appointment,  at least 7 days before you run out of medicine. Do not wait until you have 1 or 2 pills left, to come in. This is very poor planning and does not take into consideration that we may need to cancel appointments due to bad weather, sickness, or emergencies affecting our staff. °DO NOT ACCEPT A "Partial Fill": If for any reason your pharmacy does not have enough pills/tablets to completely fill or refill your prescription, do not allow for a "partial fill". The law allows the pharmacy to complete that prescription within 72 hours, without requiring a new prescription. If they do not fill the rest of your prescription within those 72 hours, you will need a separate prescription to fill the remaining amount, which we will NOT provide. If the reason for the partial fill is your insurance, you will need to talk to the pharmacist about payment alternatives for the remaining tablets, but again, DO NOT ACCEPT A PARTIAL FILL, unless you can trust your pharmacist to obtain the remainder of the pills within 72 hours. ° °Prescription refills and/or changes in medication(s):  °Prescription refills, and/or changes in dose or medication, will be conducted only during scheduled medication management appointments. (Applies to both, written and electronic prescriptions.) °No refills on procedure days. No medication will be changed or started on procedure days. No changes, adjustments, and/or refills will be conducted on a procedure day. Doing so will interfere with the diagnostic portion of the procedure. °No phone refills. No medications will be "called into the pharmacy". °No Fax refills. °No weekend refills. °No Holliday refills. °No after hours refills. ° °Remember:  °Business hours are:  °Monday to Thursday 8:00 AM to 4:00 PM °Provider's Schedule: °Janyra Barillas, MD - Appointments are:  °Medication management: Monday and Wednesday 8:00 AM to 4:00 PM °Procedure day: Tuesday and Thursday 7:30 AM to 4:00 PM °Bilal Lateef, MD -  Appointments are:  °Medication management: Tuesday and Thursday 8:00   AM to 4:00 PM °Procedure day: Monday and Wednesday 7:30 AM to 4:00 PM °(Last update: 02/16/2020) °____________________________________________________________________________________________ ° ____________________________________________________________________________________________ ° °CBD (cannabidiol) & Delta-8 (Delta-8 tetrahydrocannabinol) WARNING ° °Intro: Cannabidiol (CBD) and tetrahydrocannabinol (THC), are two natural compounds found in plants of the Cannabis genus. They can both be extracted from hemp or cannabis. Hemp and cannabis come from the Cannabis sativa plant. Both compounds interact with your body’s endocannabinoid system, but they have very different effects. CBD does not produce the high sensation associated with cannabis. Delta-8 tetrahydrocannabinol, also known as delta-8 THC, is a psychoactive substance found in the Cannabis sativa plant, of which marijuana and hemp are two varieties. THC is responsible for the high associated with the illicit use of marijuana. ° °Applicable to: All individuals currently taking or considering taking CBD (cannabidiol) and, more important, all patients taking opioid analgesic controlled substances (pain medication). (Example: oxycodone; oxymorphone; hydrocodone; hydromorphone; morphine; methadone; tramadol; tapentadol; fentanyl; buprenorphine; butorphanol; dextromethorphan; meperidine; codeine; etc.) ° °Legal status: CBD remains a Schedule I drug prohibited for any use. CBD is illegal with one exception. In the [place], CBD has a limited Food and Drug Administration (FDA) approval for the treatment of two specific types of epilepsy disorders. Only one CBD product has been approved by the FDA for this purpose: "Epidiolex". FDA is aware that some companies are marketing products containing cannabis and cannabis-derived compounds in ways that violate the Federal Food, Drug and Cosmetic Act  (FD&C Act) and that may put the health and safety of consumers at risk. The FDA, a Federal agency, has not enforced the CBD status since 2018. UPDATE: (09/14/2021) The Drug Enforcement Agency (DEA) issued a letter stating that "delta" cannabinoids, including Delta-8-THCO and Delta-9-THCO, synthetically derived from hemp do not qualify as hemp and will be viewed as Schedule I drugs. (Schedule I drugs, substances, or chemicals are defined as drugs with no currently accepted medical use and a high potential for abuse. Some examples of Schedule I drugs are: heroin, lysergic acid diethylamide (LSD), marijuana (cannabis), 3,4-methylenedioxymethamphetamine (ecstasy), methaqualone, and peyote.) (https://www.dea.gov) ° °Legality: Some manufacturers ship CBD products nationally, which is illegal. Often such products are sold online and are therefore available throughout the country. CBD is openly sold in head shops and health food stores in some states where such sales have not been explicitly legalized. Selling unapproved products with unsubstantiated therapeutic claims is not only a violation of the law, but also can put patients at risk, as these products have not been proven to be safe or effective. Federal illegality makes it difficult to conduct research on CBD. ° °Reference: "FDA Regulation of Cannabis and Cannabis-Derived Products, Including Cannabidiol (CBD)" - https://www.fda.gov/news-events/public-health-focus/fda-regulation-cannabis-and-cannabis-derived-products-including-cannabidiol-cbd ° °Warning: CBD is not FDA approved and has not undergo the same manufacturing controls as prescription drugs.  This means that the purity and safety of available CBD may be questionable. Most of the time, despite manufacturer's claims, it is contaminated with THC (delta-9-tetrahydrocannabinol - the chemical in marijuana responsible for the "HIGH").  When this is the case, the THC contaminant will trigger a positive urine drug  screen (UDS) test for Marijuana (carboxy-THC). Because a positive UDS for any illicit substance is a violation of our medication agreement, your opioid analgesics (pain medicine) may be permanently discontinued. °The FDA recently put out a warning about 5 things that everyone should be aware of regarding Delta-8 THC: °Delta-8 THC products have not been evaluated or approved by the FDA for safe use and may be marketed in ways that put the   public health at risk. °The FDA has received adverse event reports involving delta-8 THC-containing products. °Delta-8 THC has psychoactive and intoxicating effects. °Delta-8 THC manufacturing often involve use of potentially harmful chemicals to create the concentrations of delta-8 THC claimed in the marketplace. The final delta-8 THC product may have potentially harmful by-products (contaminants) due to the chemicals used in the process. Manufacturing of delta-8 THC products may occur in uncontrolled or unsanitary settings, which may lead to the presence of unsafe contaminants or other potentially harmful substances. °Delta-8 THC products should be kept out of the reach of children and pets. ° °MORE ABOUT CBD ° °General Information: CBD was discovered in 1940 and it is a derivative of the cannabis sativa genus plants (Marijuana and Hemp). It is one of the 113 identified substances found in Marijuana. It accounts for up to 40% of the plant's extract. As of 2018, preliminary clinical studies on CBD included research for the treatment of anxiety, movement disorders, and pain. CBD is available and consumed in multiple forms, including inhalation of smoke or vapor, as an aerosol spray, and by mouth. It may be supplied as an oil containing CBD, capsules, dried cannabis, or as a liquid solution. CBD is thought not to be as psychoactive as THC (delta-9-tetrahydrocannabinol - the chemical in marijuana responsible for the "HIGH"). Studies suggest that CBD may interact with different  biological target receptors in the body, including cannabinoid and other neurotransmitter receptors. As of 2018 the mechanism of action for its biological effects has not been determined. ° °Side-effects   Adverse reactions: Dry mouth, diarrhea, decreased appetite, fatigue, drowsiness, malaise, weakness, sleep disturbances, and others. ° °Drug interactions: CBC may interact with other medications such as blood-thinners. Because CBD causes drowsiness on its own, it also increases the drowsiness caused by other medications, including antihistamines (such as Benadryl), benzodiazepines (Xanax, Ativan, Valium), antipsychotics, antidepressants and opioids, as well as alcohol and supplements such as kava, melatonin and St. John's Wort. Be cautious with the following combinations:  ° °Brivaracetam (Briviact) °Brivaracetam is changed and broken down by the body. CBD might decrease how quickly the body breaks down brivaracetam. This might increase levels of brivaracetam in the body. ° °Caffeine °Caffeine is changed and broken down by the body. CBD might decrease how quickly the body breaks down caffeine. This might increase levels of caffeine in the body. ° °Carbamazepine (Tegretol) °Carbamazepine is changed and broken down by the body. CBD might decrease how quickly the body breaks down carbamazepine. This might increase levels of carbamazepine in the body and increase its side effects. ° °Citalopram (Celexa) °Citalopram is changed and broken down by the body. CBD might decrease how quickly the body breaks down citalopram. This might increase levels of citalopram in the body and increase its side effects. ° °Clobazam (Onfi) °Clobazam is changed and broken down by the liver. CBD might decrease how quickly the liver breaks down clobazam. This might increase the effects and side effects of clobazam. ° °Eslicarbazepine (Aptiom) °Eslicarbazepine is changed and broken down by the body. CBD might decrease how quickly the body  breaks down eslicarbazepine. This might increase levels of eslicarbazepine in the body by a small amount. ° °Everolimus (Zostress) °Everolimus is changed and broken down by the body. CBD might decrease how quickly the body breaks down everolimus. This might increase levels of everolimus in the body. ° °Lithium °Taking higher doses of CBD might increase levels of lithium. This can increase the risk of lithium toxicity. ° °Medications changed by the   liver (Cytochrome P450 1A1 (CYP1A1) substrates) °Some medications are changed and broken down by the liver. CBD might change how quickly the liver breaks down these medications. This could change the effects and side effects of these medications. ° °Medications changed by the liver (Cytochrome P450 1A2 (CYP1A2) substrates) °Some medications are changed and broken down by the liver. CBD might change how quickly the liver breaks down these medications. This could change the effects and side effects of these medications. ° °Medications changed by the liver (Cytochrome P450 1B1 (CYP1B1) substrates) °Some medications are changed and broken down by the liver. CBD might change how quickly the liver breaks down these medications. This could change the effects and side effects of these medications. ° °Medications changed by the liver (Cytochrome P450 2A6 (CYP2A6) substrates) °Some medications are changed and broken down by the liver. CBD might change how quickly the liver breaks down these medications. This could change the effects and side effects of these medications. ° °Medications changed by the liver (Cytochrome P450 2B6 (CYP2B6) substrates) °Some medications are changed and broken down by the liver. CBD might change how quickly the liver breaks down these medications. This could change the effects and side effects of these medications. ° °Medications changed by the liver (Cytochrome P450 2C19 (CYP2C19) substrates) °Some medications are changed and broken down by the liver.  CBD might change how quickly the liver breaks down these medications. This could change the effects and side effects of these medications. ° °Medications changed by the liver (Cytochrome P450 2C8 (CYP2C8) substrates) °Some medications are changed and broken down by the liver. CBD might change how quickly the liver breaks down these medications. This could change the effects and side effects of these medications. ° °Medications changed by the liver (Cytochrome P450 2C9 (CYP2C9) substrates) °Some medications are changed and broken down by the liver. CBD might change how quickly the liver breaks down these medications. This could change the effects and side effects of these medications. ° °Medications changed by the liver (Cytochrome P450 2D6 (CYP2D6) substrates) °Some medications are changed and broken down by the liver. CBD might change how quickly the liver breaks down these medications. This could change the effects and side effects of these medications. ° °Medications changed by the liver (Cytochrome P450 2E1 (CYP2E1) substrates) °Some medications are changed and broken down by the liver. CBD might change how quickly the liver breaks down these medications. This could change the effects and side effects of these medications. ° °Medications changed by the liver (Cytochrome P450 3A4 (CYP3A4) substrates) °Some medications are changed and broken down by the liver. CBD might change how quickly the liver breaks down these medications. This could change the effects and side effects of these medications. ° °Medications changed by the liver (Glucuronidated drugs) °Some medications are changed and broken down by the liver. CBD might change how quickly the liver breaks down these medications. This could change the effects and side effects of these medications. ° °Medications that decrease the breakdown of other medications by the liver (Cytochrome P450 2C19 (CYP2C19) inhibitors) °CBD is changed and broken down by the liver.  Some drugs decrease how quickly the liver changes and breaks down CBD. This could change the effects and side effects of CBD. ° °Medications that decrease the breakdown of other medications in the liver (Cytochrome P450 3A4 (CYP3A4) inhibitors) °CBD is changed and broken down by the liver. Some drugs decrease how quickly the liver changes and breaks down CBD. This could change the   effects and side effects of CBD. ° °Medications that increase breakdown of other medications by the liver (Cytochrome P450 3A4 (CYP3A4) inducers) °CBD is changed and broken down by the liver. Some drugs increase how quickly the liver changes and breaks down CBD. This could change the effects and side effects of CBD. ° °Medications that increase the breakdown of other medications by the liver (Cytochrome P450 2C19 (CYP2C19) inducers) °CBD is changed and broken down by the liver. Some drugs increase how quickly the liver changes and breaks down CBD. This could change the effects and side effects of CBD. ° °Methadone (Dolophine) °Methadone is broken down by the liver. CBD might decrease how quickly the liver breaks down methadone. Taking cannabidiol along with methadone might increase the effects and side effects of methadone. ° °Rufinamide (Banzel) °Rufinamide is changed and broken down by the body. CBD might decrease how quickly the body breaks down rufinamide. This might increase levels of rufinamide in the body by a small amount. ° °Sedative medications (CNS depressants) °CBD might cause sleepiness and slowed breathing. Some medications, called sedatives, can also cause sleepiness and slowed breathing. Taking CBD with sedative medications might cause breathing problems and/or too much sleepiness. ° °Sirolimus (Rapamune) °Sirolimus is changed and broken down by the body. CBD might decrease how quickly the body breaks down sirolimus. This might increase levels of sirolimus in the body. ° °Stiripentol (Diacomit) °Stiripentol is changed and  broken down by the body. CBD might decrease how quickly the body breaks down stiripentol. This might increase levels of stiripentol in the body and increase its side effects. ° °Tacrolimus (Prograf) °Tacrolimus is changed and broken down by the body. CBD might decrease how quickly the body breaks down tacrolimus. This might increase levels of tacrolimus in the body. ° °Tamoxifen (Soltamox) °Tamoxifen is changed and broken down by the body. CBD might affect how quickly the body breaks down tamoxifen. This might affect levels of tamoxifen in the body. ° °Topiramate (Topamax) °Topiramate is changed and broken down by the body. CBD might decrease how quickly the body breaks down topiramate. This might increase levels of topiramate in the body by a small amount. ° °Valproate °Valproic acid can cause liver injury. Taking cannabidiol with valproic acid might increase the chance of liver injury. CBD and/or valproic acid might need to be stopped, or the dose might need to be reduced. ° °Warfarin (Coumadin) °CBD might increase levels of warfarin, which can increase the risk for bleeding. CBD and/or warfarin might need to be stopped, or the dose might need to be reduced. ° °Zonisamide °Zonisamide is changed and broken down by the body. CBD might decrease how quickly the body breaks down zonisamide. This might increase levels of zonisamide in the body by a small amount. °(Last update: 09/26/2021) °____________________________________________________________________________________________ ° ____________________________________________________________________________________________ ° °Drug Holidays (Slow) ° °What is a "Drug Holiday"? °Drug Holiday: is the name given to the period of time during which a patient stops taking a medication(s) for the purpose of eliminating tolerance to the drug. ° °Benefits °Improved effectiveness of opioids. °Decreased opioid dose needed to achieve benefits. °Improved pain with lesser  dose. ° °What is tolerance? °Tolerance: is the progressive decreased in effectiveness of a drug due to its repetitive use. With repetitive use, the body gets use to the medication and as a consequence, it loses its effectiveness. This is a common problem seen with opioid pain medications. As a result, a larger dose of the drug is needed to achieve the same effect   that used to be obtained with a smaller dose. ° °How long should a "Drug Holiday" last? °You should stay off of the pain medicine for at least 14 consecutive days. (2 weeks) ° °Should I stop the medicine "cold turkey"? °No. You should always coordinate with your Pain Specialist so that he/she can provide you with the correct medication dose to make the transition as smoothly as possible. ° °How do I stop the medicine? °Slowly. You will be instructed to decrease the daily amount of pills that you take by one (1) pill every seven (7) days. This is called a "slow downward taper" of your dose. For example: if you normally take four (4) pills per day, you will be asked to drop this dose to three (3) pills per day for seven (7) days, then to two (2) pills per day for seven (7) days, then to one (1) per day for seven (7) days, and at the end of those last seven (7) days, this is when the "Drug Holiday" would start.  ° °Will I have withdrawals? °By doing a "slow downward taper" like this one, it is unlikely that you will experience any significant withdrawal symptoms. Typically, what triggers withdrawals is the sudden stop of a high dose opioid therapy. Withdrawals can usually be avoided by slowly decreasing the dose over a prolonged period of time. If you do not follow these instructions and decide to stop your medication abruptly, withdrawals may be possible. ° °What are withdrawals? °Withdrawals: refers to the wide range of symptoms that occur after stopping or dramatically reducing opiate drugs after heavy and prolonged use. Withdrawal symptoms do not occur to  patients that use low dose opioids, or those who take the medication sporadically. Contrary to benzodiazepine (example: Valium, Xanax, etc.) or alcohol withdrawals (“Delirium Tremens”), opioid withdrawals are not lethal. Withdrawals are the physical manifestation of the body getting rid of the excess receptors. ° °Expected Symptoms °Early symptoms of withdrawal may include: °Agitation °Anxiety °Muscle aches °Increased tearing °Insomnia °Runny nose °Sweating °Yawning ° °Late symptoms of withdrawal may include: °Abdominal cramping °Diarrhea °Dilated pupils °Goose bumps °Nausea °Vomiting ° °Will I experience withdrawals? °Due to the slow nature of the taper, it is very unlikely that you will experience any. ° °What is a slow taper? °Taper: refers to the gradual decrease in dose.  °(Last update: 02/16/2020) °____________________________________________________________________________________________ ° °  °

## 2021-10-03 NOTE — Progress Notes (Signed)
Nursing Pain Medication Assessment:  ?Safety precautions to be maintained throughout the outpatient stay will include: orient to surroundings, keep bed in low position, maintain call bell within reach at all times, provide assistance with transfer out of bed and ambulation.  ?Medication Inspection Compliance: Pill count conducted under aseptic conditions, in front of the patient. Neither the pills nor the bottle was removed from the patient's sight at any time. Once count was completed pills were immediately returned to the patient in their original bottle. ? ?Medication: Tramadol (Ultram) ?Pill/Patch Count:  47 of 90 pills remain ?Pill/Patch Appearance: Markings consistent with prescribed medication ?Bottle Appearance: Standard pharmacy container. Clearly labeled. ?Filled Date: 02 / 23 / 2023 ?Last Medication intake:  Today ?

## 2021-10-10 ENCOUNTER — Encounter: Payer: Medicare Other | Admitting: Pain Medicine

## 2021-10-18 ENCOUNTER — Telehealth: Payer: Self-pay | Admitting: Pain Medicine

## 2021-10-18 ENCOUNTER — Other Ambulatory Visit: Payer: Self-pay | Admitting: Pain Medicine

## 2021-10-18 DIAGNOSIS — Z79899 Other long term (current) drug therapy: Secondary | ICD-10-CM

## 2021-10-18 DIAGNOSIS — M5137 Other intervertebral disc degeneration, lumbosacral region: Secondary | ICD-10-CM

## 2021-10-18 DIAGNOSIS — Z79891 Long term (current) use of opiate analgesic: Secondary | ICD-10-CM

## 2021-10-18 DIAGNOSIS — M47816 Spondylosis without myelopathy or radiculopathy, lumbar region: Secondary | ICD-10-CM

## 2021-10-18 DIAGNOSIS — M545 Low back pain, unspecified: Secondary | ICD-10-CM

## 2021-10-18 DIAGNOSIS — M4854XS Collapsed vertebra, not elsewhere classified, thoracic region, sequela of fracture: Secondary | ICD-10-CM

## 2021-10-18 DIAGNOSIS — M5135 Other intervertebral disc degeneration, thoracolumbar region: Secondary | ICD-10-CM

## 2021-10-18 DIAGNOSIS — G894 Chronic pain syndrome: Secondary | ICD-10-CM

## 2021-10-18 DIAGNOSIS — G8929 Other chronic pain: Secondary | ICD-10-CM

## 2021-10-18 DIAGNOSIS — S22080S Wedge compression fracture of T11-T12 vertebra, sequela: Secondary | ICD-10-CM

## 2021-10-18 NOTE — Telephone Encounter (Signed)
Last Rx for Tramadol began on 9-27 22, had 5 refills. Calculating 30 days per refill, the next one is due on 10-21-21. I called Arbie Cookey and explained this to her, advised her to tell Brittney Simpson to make them last until 10-21-21 because there are strict regulations with scheduled medications. ?

## 2021-10-25 ENCOUNTER — Other Ambulatory Visit: Payer: Self-pay | Admitting: Family Medicine

## 2021-10-25 DIAGNOSIS — N183 Chronic kidney disease, stage 3 unspecified: Secondary | ICD-10-CM

## 2021-10-25 DIAGNOSIS — F419 Anxiety disorder, unspecified: Secondary | ICD-10-CM
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
## 2021-11-05 ENCOUNTER — Ambulatory Visit (INDEPENDENT_AMBULATORY_CARE_PROVIDER_SITE_OTHER): Payer: Medicare Other | Admitting: Family Medicine

## 2021-11-05 ENCOUNTER — Encounter: Payer: Self-pay | Admitting: Family Medicine

## 2021-11-05 VITALS — BP 126/60 | HR 56 | Ht 63.0 in | Wt 122.4 lb

## 2021-11-05 DIAGNOSIS — I129 Hypertensive chronic kidney disease with stage 1 through stage 4 chronic kidney disease, or unspecified chronic kidney disease: Secondary | ICD-10-CM

## 2021-11-05 DIAGNOSIS — G2581 Restless legs syndrome: Secondary | ICD-10-CM | POA: Diagnosis not present

## 2021-11-05 DIAGNOSIS — N183 Chronic kidney disease, stage 3 unspecified: Secondary | ICD-10-CM

## 2021-11-05 DIAGNOSIS — E034 Atrophy of thyroid (acquired): Secondary | ICD-10-CM | POA: Diagnosis not present

## 2021-11-05 MED ORDER — GABAPENTIN 100 MG PO CAPS: 100.0000 mg | ORAL_CAPSULE | Freq: Every day | ORAL | 1 refills | Status: DC

## 2021-11-05 MED ORDER — FUROSEMIDE 20 MG PO TABS: 30.0000 mg | ORAL_TABLET | Freq: Every day | ORAL | 5 refills | Status: DC

## 2021-11-05 NOTE — Patient Instructions (Addendum)
Thank you for coming to the office today. ? ?Urinary infection UTI is common cause of tremors. ? ?Can get the Shingles vaccine at pharmacy or here if interested, but may not be necessary. ? ?Start gabapentin at night for restless leg and tremors. ? ?Start Gabapentin '100mg'$  capsules, take at night for 2-3 nights only, and then increase to 2 times a day for a few days, and then may increase to 2 to 3 pills at night ? ?I am concerned about anemia as a cause of feeling tired, we will check the blood also for thyroid. ? ?Refilled Furosemide. ? ?Did not refill Clonazepam because it was already done on 10/26/21. ? ? ?Please schedule a Follow-up Appointment to: Return in about 6 weeks (around 12/17/2021) for 6 week follow-up if not improved, RLS, tremors.. ? ?If you have any other questions or concerns, please feel free to call the office or send a message through [place]. You may also schedule an earlier appointment if necessary. ? ?Additionally, you may be receiving a survey about your experience at our office within a few days to 1 week by e-mail or mail. We value your feedback. ? ?Nobie Putnam, DO ?[place] ?

## 2021-11-05 NOTE — Progress Notes (Signed)
? ?Subjective:  ? ? Patient ID: Brittney Simpson, female    DOB: 09-07-1927, 88 y.o.   MRN: [id number] ? ?Brittney Simpson is a 88 y.o. female presenting on 11/05/2021 for Hypertension ? ? ?HPI ? ?HTN ?CKD ?Recurrent UTI ?Followed by Dr Juleen China ?On antibiotic prophylaxis still with bactrim daily also now on PRN UTI rx per Nephrology Amoxicillin ? ?Fall backwards, had issue with Right Hip Pain. ?Following with Pain Management. ? ?RLS ?Essential Temors ?Describes whole body quivering and tremor. ?Keeping up at night with RLS ?Episodes of sweating, perspiring at times. ? ?Currently eating well and sleeping well. ?Less active due to worsening functional pain on her back. ? ?Anemia ?Prior lab with anemia, Microcytic low Hgb. ?Due for labs. ?Admits fatigue and tired. ? ?Anxiety ?Improved managed on Clonazepam ?Not due for refill, has on file. ? ? ?Health Maintenance: ?Discussed Shingles vaccine, no documentation. ? ? ?  10/03/2021  ? 10:54 AM 09/18/2021  ? 10:42 AM 04/24/2021  ? 10:38 AM  ?Depression screen PHQ 2/9  ?Decreased Interest 0 0 0  ?Down, Depressed, Hopeless 0 0 0  ?PHQ - 2 Score 0 0 0  ? ? ?Social History  ? ?Tobacco Use  ? Smoking status: Never  ? Smokeless tobacco: Never  ?Vaping Use  ? Vaping Use: Never used  ?Substance Use Topics  ? Alcohol use: No  ?  Alcohol/week: 0.0 standard drinks  ? Drug use: No  ? ? ?Review of Systems ?Per HPI unless specifically indicated above ? ?   ?Objective:  ?  ?BP 126/60   Pulse (!) 56   Ht '5\' 3"'$  (1.6 m)   Wt 122 lb 6.4 oz (55.5 kg)   SpO2 99%   BMI 21.68 kg/m?   ?Wt Readings from Last 3 Encounters:  ?11/05/21 122 lb 6.4 oz (55.5 kg)  ?10/03/21 115 lb (52.2 kg)  ?09/18/21 115 lb (52.2 kg)  ?  ?Physical Exam ?Vitals and nursing note reviewed.  ?Constitutional:   ?   General: She is not in acute distress. ?   Appearance: She is well-developed. She is not diaphoretic.  ?   Comments: Well-appearing thin elderly 88 year old female, comfortable, cooperative  ?HENT:  ?   Head:  Normocephalic and atraumatic.  ?   Comments: Hard of hearing but able to follow conversation ?Eyes:  ?   General:     ?   Right eye: No discharge.     ?   Left eye: No discharge.  ?   Conjunctiva/sclera: Conjunctivae normal.  ?Neck:  ?   Thyroid: No thyromegaly.  ?Cardiovascular:  ?   Rate and Rhythm: Normal rate and regular rhythm.  ?   Heart sounds: Normal heart sounds. No murmur heard. ?Pulmonary:  ?   Effort: Pulmonary effort is normal. No respiratory distress.  ?   Breath sounds: Normal breath sounds. No wheezing or rales.  ?Abdominal:  ?   General: Bowel sounds are normal. There is no distension.  ?   Palpations: Abdomen is soft.  ?   Tenderness: There is no abdominal tenderness.  ?Musculoskeletal:     ?   General: Normal range of motion.  ?   Cervical back: Normal range of motion and neck supple.  ?   Right lower leg: Edema (trace, non pitting) present.  ?   Left lower leg: Edema (trace non pitting) present.  ?Lymphadenopathy:  ?   Cervical: No cervical adenopathy.  ?Skin: ?   General: Skin is warm  and dry.  ?   Findings: No erythema or rash.  ?Neurological:  ?   Mental Status: She is alert and oriented to person, place, and time.  ?Psychiatric:     ?   Behavior: Behavior normal.  ?   Comments: Well groomed, good eye contact, normal speech and thoughts  ? ? ? ?Results for orders placed or performed in visit on 04/30/21  ?CULTURE, URINE COMPREHENSIVE  ? Specimen: Urine  ? UR  ?Result Value Ref Range  ? Urine Culture, Comprehensive Final report   ? Organism ID, Bacteria Comment   ?Microscopic Examination  ? Urine  ?Result Value Ref Range  ? WBC, UA 0-5 0 - 5 /hpf  ? RBC None seen 0 - 2 /hpf  ? Epithelial Cells (non renal) 0-10 0 - 10 /hpf  ? Bacteria, UA None seen None seen/Few  ?Urinalysis, Complete  ?Result Value Ref Range  ? Specific Gravity, UA 1.015 1.005 - 1.030  ? pH, UA 7.0 5.0 - 7.5  ? Color, UA Yellow Yellow  ? Appearance Ur Clear Clear  ? Leukocytes,UA Negative Negative  ? Protein,UA Negative  Negative/Trace  ? Glucose, UA Negative Negative  ? Ketones, UA Negative Negative  ? RBC, UA Negative Negative  ? Bilirubin, UA Negative Negative  ? Urobilinogen, Ur 0.2 0.2 - 1.0 mg/dL  ? Nitrite, UA Negative Negative  ? Microscopic Examination See below:   ? ?   ?Assessment & Plan:  ? ?Problem List Items Addressed This Visit   ? ? Hypothyroidism  ? Relevant Orders  ? TSH  ? T4, free  ? Benign hypertension with CKD (chronic kidney disease) stage III (HCC) - Primary  ?  Controlled BP ?Complicated by CKD-III ? ?Plan: ?1. Continue current regimen - Amlodipine '5mg'$  daily, Lisinopril '40mg'$  daily, Metoprolol XL '100mg'$  daily ?- May continue Lasix '30mg'$  daily now per Nephrology - refilled ?2. Encourage to stay active, low sodium diet, improve hydration with water ?  ?  ? Relevant Medications  ? furosemide (LASIX) 20 MG tablet  ? Other Relevant Orders  ? CBC with Differential/Platelet  ? BASIC METABOLIC PANEL WITH GFR  ? ?Other Visit Diagnoses   ? ? RLS (restless legs syndrome)      ? Relevant Medications  ? gabapentin (NEURONTIN) 100 MG capsule  ? ?  ?  ?Urinary infection UTI is common cause of tremors. ? ?Can get the Shingles vaccine at pharmacy or here if interested, but may not be necessary. ? ?Start gabapentin at night for restless leg and tremors. ? ?Start Gabapentin '100mg'$  capsules, take at night for 2-3 nights only, and then increase to 2 times a day for a few days, and then may increase to 2 to 3 pills at night ? ?I am concerned about anemia as a cause of feeling tired, we will check the blood also for thyroid. ? ?Refilled Furosemide. ? ?Did not refill Clonazepam because it was already done on 10/26/21. ? ? ?Meds ordered this encounter  ?Medications  ? gabapentin (NEURONTIN) 100 MG capsule  ?  Sig: Take 1 capsule (100 mg total) by mouth at bedtime. May increase dose up to max 3 capsules at night if needed.  ?  Dispense:  90 capsule  ?  Refill:  1  ? furosemide (LASIX) 20 MG tablet  ?  Sig: Take 1.5 tablets (30 mg total)  by mouth daily.  ?  Dispense:  45 tablet  ?  Refill:  5  ? ? ? ? ?Follow  up plan: ?Return in about 6 weeks (around 12/17/2021) for 6 week follow-up if not improved, RLS, tremors.. ? ?Nobie Putnam, DO ?[facility] ?[facility] ?11/05/2021, 11:40 AM ?

## 2021-11-05 NOTE — Assessment & Plan Note (Signed)
Controlled BP Complicated by CKD-III  Plan: 1. Continue current regimen - Amlodipine 5mg daily, Lisinopril 40mg daily, Metoprolol XL 100mg daily - May continue Lasix 30mg daily now per Nephrology - refilled 2. Encourage to stay active, low sodium diet, improve hydration with water 

## 2021-11-06 LAB — CBC WITH DIFFERENTIAL/PLATELET
Absolute Monocytes: 693 cells/uL (ref 200–950)
Basophils Absolute: 21 cells/uL (ref 0–200)
Basophils Relative: 0.3 %
Eosinophils Absolute: 21 cells/uL (ref 15–500)
Eosinophils Relative: 0.3 %
HCT: 35.2 % (ref 35.0–45.0)
Hemoglobin: 10.5 g/dL — ABNORMAL LOW (ref 11.7–15.5)
Lymphs Abs: 1743 cells/uL (ref 850–3900)
MCH: 23.6 pg — ABNORMAL LOW (ref 27.0–33.0)
MCHC: 29.8 g/dL — ABNORMAL LOW (ref 32.0–36.0)
MCV: 79.1 fL — ABNORMAL LOW (ref 80.0–100.0)
MPV: 9.8 fL (ref 7.5–12.5)
Monocytes Relative: 9.9 %
Neutro Abs: 4522 cells/uL (ref 1500–7800)
Neutrophils Relative %: 64.6 %
Platelets: 346 10*3/uL (ref 140–400)
RBC: 4.45 10*6/uL (ref 3.80–5.10)
RDW: 14.4 % (ref 11.0–15.0)
Total Lymphocyte: 24.9 %
WBC: 7 10*3/uL (ref 3.8–10.8)

## 2021-11-06 LAB — BASIC METABOLIC PANEL WITH GFR
BUN: 10 mg/dL (ref 7–25)
CO2: 27 mmol/L (ref 20–32)
Calcium: 9 mg/dL (ref 8.6–10.4)
Chloride: 96 mmol/L — ABNORMAL LOW (ref 98–110)
Creat: 0.9 mg/dL (ref 0.60–0.95)
Glucose, Bld: 97 mg/dL (ref 65–139)
Potassium: 4.7 mmol/L (ref 3.5–5.3)
Sodium: 133 mmol/L — ABNORMAL LOW (ref 135–146)
eGFR: 60 mL/min/{1.73_m2} (ref >=60)

## 2021-11-06 LAB — TSH: TSH: 3.79 mIU/L (ref 0.40–4.50)

## 2021-11-06 LAB — T4, FREE: Free T4: 1.4 ng/dL (ref 0.8–1.8)

## 2021-11-13 ENCOUNTER — Encounter (INDEPENDENT_AMBULATORY_CARE_PROVIDER_SITE_OTHER): Payer: Self-pay | Admitting: Vascular Surgery

## 2021-11-13 ENCOUNTER — Ambulatory Visit (INDEPENDENT_AMBULATORY_CARE_PROVIDER_SITE_OTHER): Payer: Medicare Other

## 2021-11-13 ENCOUNTER — Ambulatory Visit (INDEPENDENT_AMBULATORY_CARE_PROVIDER_SITE_OTHER): Payer: Medicare Other | Admitting: Vascular Surgery

## 2021-11-13 ENCOUNTER — Telehealth: Payer: Self-pay | Admitting: Family Medicine

## 2021-11-13 VITALS — BP 162/75 | HR 56 | Resp 14 | Ht 63.0 in | Wt 121.0 lb

## 2021-11-13 DIAGNOSIS — E782 Mixed hyperlipidemia: Secondary | ICD-10-CM

## 2021-11-13 DIAGNOSIS — N1831 Chronic kidney disease, stage 3a: Secondary | ICD-10-CM | POA: Diagnosis not present

## 2021-11-13 DIAGNOSIS — I7143 Infrarenal abdominal aortic aneurysm, without rupture: Secondary | ICD-10-CM

## 2021-11-13 DIAGNOSIS — I10 Essential (primary) hypertension: Secondary | ICD-10-CM

## 2021-11-13 DIAGNOSIS — M545 Low back pain, unspecified: Secondary | ICD-10-CM

## 2021-11-13 DIAGNOSIS — G8929 Other chronic pain: Secondary | ICD-10-CM

## 2021-11-13 NOTE — Telephone Encounter (Signed)
Patient's daughter called. She stated she could see lab results in [place] but not the provider's comments. Read provider's comments, gave specific levels she asked about, answered questions.  ?

## 2021-11-13 NOTE — Assessment & Plan Note (Signed)
Her duplex today shows a smaller aorta than previously seen on duplex measuring just less than 4 cm in maximal diameter.  Her previous studies have been in the 4.4-4.5 range so this may be an anomalously low, but it clearly has not grown in size.  In a 88 year old, with an aneurysm less than 5 cm we will continue to monitor this and not plan repair.  Follow-up in 6 months with duplex. ?

## 2021-11-13 NOTE — Progress Notes (Signed)
? ? ?MRN : [id number] ? ?Brittney Simpson is a 88 y.o. ([birth year]) female who presents with chief complaint of  ?Chief Complaint  ?Patient presents with  ? Follow-up  ?  ultrasound  ?. ? ?History of Present Illness: Patient returns today in follow up of her abdominal aortic aneurysm.  She is doing well today without specific complaints.  She has no aneurysm related symptoms. Specifically, the patient denies new back or abdominal pain, or signs of peripheral embolization.  Her duplex today shows a smaller aorta than previously seen on duplex measuring just less than 4 cm in maximal diameter.  Her previous studies have been in the 4.4-4.5 range so this may be an anomalously low, but it clearly has not grown in size. ? ?Current Outpatient Medications  ?Medication Sig Dispense Refill  ? acetaminophen (TYLENOL) 500 MG tablet Take 1,000 mg by mouth daily as needed for moderate pain or headache.    ? amLODipine (NORVASC) 5 MG tablet Take 1 tablet (5 mg total) by mouth daily with lunch. 90 tablet 3  ? Cholecalciferol (VITAMIN D3) 50 MCG (2000 UT) TABS Take 2,000 Units by mouth daily with lunch.    ? clonazePAM (KLONOPIN) 0.5 MG tablet TAKE 1/2 TABLET BY MOUTH TWICE DAILY 30 tablet 2  ? Cranberry 500 MG CAPS Take 1,000 mg by mouth daily with supper.    ? furosemide (LASIX) 20 MG tablet Take 1.5 tablets (30 mg total) by mouth daily. 45 tablet 5  ? gabapentin (NEURONTIN) 100 MG capsule Take 1 capsule (100 mg total) by mouth at bedtime. May increase dose up to max 3 capsules at night if needed. 90 capsule 1  ? ibuprofen (MOTRIN IB) 200 MG tablet Take 1 tablet (200 mg total) by mouth every 6 (six) hours as needed. 100 tablet 2  ? latanoprost (XALATAN) 0.005 % ophthalmic solution Place 1 drop into both eyes at bedtime.     ? levothyroxine (SYNTHROID) 112 MCG tablet Take 1 tablet (112 mcg total) by mouth daily before breakfast. 90 tablet 3  ? lisinopril (ZESTRIL) 40 MG tablet Take 1 tablet (40 mg total) by mouth daily with  lunch. 90 tablet 3  ? Melatonin 10 MG TABS Take 10 mg by mouth at bedtime.    ? metoprolol succinate (TOPROL-XL) 100 MG 24 hr tablet TAKE 1 TABLET BY MOUTH ONCE DAILY WITH FOOD 90 tablet 3  ? omeprazole (PRILOSEC) 20 MG capsule Take 1 capsule (20 mg total) by mouth daily before breakfast. 90 capsule 3  ? sulfamethoxazole-trimethoprim (BACTRIM) 400-80 MG tablet Take 1 tablet by mouth every evening.    ? tamsulosin (FLOMAX) 0.4 MG CAPS capsule Take 1 capsule (0.4 mg total) by mouth daily. 30 capsule 11  ? traMADol (ULTRAM) 50 MG tablet Take 1 tablet (50 mg total) by mouth 3 (three) times daily. Each refill must last 30 days. 90 tablet 5  ? traZODone (DESYREL) 50 MG tablet Take 1 tablet (50 mg total) by mouth at bedtime. 90 tablet 3  ? amoxicillin (AMOXIL) 250 MG capsule Take 250 mg by mouth 3 (three) times daily. (Patient not taking: Reported on 11/13/2021)    ? methocarbamol (ROBAXIN) 500 MG tablet Take 1 tablet (500 mg total) by mouth daily as needed for muscle spasms. 30 tablet 0  ? ?No current facility-administered medications for this visit.  ? ? ?Past Medical History:  ?Diagnosis Date  ? Anxiety   ? Arthritis   ? Back ache   ? Hiatal hernia   ?  Hyperlipidemia   ? Hypertension   ? Hypothyroid   ? Osteopenia   ? Thyroid disease   ? UTI (urinary tract infection)   ? ? ?Past Surgical History:  ?Procedure Laterality Date  ? CHOLECYSTECTOMY    ? HEMORRHOID SURGERY    ? HERNIA REPAIR    ? HERNIA REPAIR    ? KYPHOPLASTY N/A 01/23/2017  ? Procedure: [id number];  Surgeon: Hessie Knows, MD;  Location: [facility] ORS;  Service: Orthopedics;  Laterality: N/A;  ? KYPHOPLASTY N/A 01/25/2020  ? Procedure: L4 KYPHOPLASTY;  Surgeon: Hessie Knows, MD;  Location: [facility] ORS;  Service: Orthopedics;  Laterality: N/A;  ? ? ?Social History  ?  ?         ?Tobacco Use  ? Smoking status: Never Smoker  ? Smokeless tobacco: Never Used  ?Substance Use Topics  ? Alcohol use: No  ?    Alcohol/week: 0.0 standard drinks  ? Drug use: No  ?  ?  ?          ?Family History  ?Problem Relation Age of Onset  ? Stroke Mother    ? Cancer Father    ?No bleeding or clotting disorders.  No aneurysms. ?  ?  ?No Known Allergies ?  ?REVIEW OF SYSTEMS (Negative unless checked) ?  ?Constitutional: '[]' Weight loss  '[]' Fever  '[]' Chills ?Cardiac: '[]' Chest pain   '[]' Chest pressure   '[]' Palpitations   '[]' Shortness of breath when laying flat   '[]' Shortness of breath at rest   '[]' Shortness of breath with exertion. ?Vascular:  '[]' Pain in legs with walking   '[]' Pain in legs at rest   '[]' Pain in legs when laying flat   '[]' Claudication   '[]' Pain in feet when walking  '[]' Pain in feet at rest  '[]' Pain in feet when laying flat   '[]' History of DVT   '[]' Phlebitis   '[]' Swelling in legs   '[]' Varicose veins   '[]' Non-healing ulcers ?Pulmonary:   '[]' Uses home oxygen   '[]' Productive cough   '[]' Hemoptysis   '[]' Wheeze  '[]' COPD   '[]' Asthma ?Neurologic:  '[]' Dizziness  '[]' Blackouts   '[]' Seizures   '[]' History of stroke   '[]' History of TIA  '[]' Aphasia   '[]' Temporary blindness   '[]' Dysphagia   '[]' Weakness or numbness in arms   '[]' Weakness or numbness in legs ?Musculoskeletal:  '[x]' Arthritis   '[]' Joint swelling   '[]' Joint pain   '[x]' Low back pain ?Hematologic:  '[]' Easy bruising  '[]' Easy bleeding   '[]' Hypercoagulable state   '[]' Anemic  '[]' Hepatitis ?Gastrointestinal:  '[]' Blood in stool   '[]' Vomiting blood  '[]' Gastroesophageal reflux/heartburn   '[]' Abdominal pain ?Genitourinary:  '[]' Chronic kidney disease   '[]' Difficult urination  '[x]' Frequent urination  '[]' Burning with urination   '[]' Hematuria ?Skin:  '[]' Rashes   '[]' Ulcers   '[]' Wounds ?Psychological:  '[x]' History of anxiety   '[]'  History of major depression ? ? ?Physical Examination ? ?BP (!) 162/75 (BP Location: Left Arm)   Pulse (!) 56   Resp 14   Ht '5\' 3"'  (1.6 m)   Wt 121 lb (54.9 kg)   BMI 21.43 kg/m?  ?Gen:  WD/WN, NAD.  Appears younger than stated age ?Head: [place]/AT, No temporalis wasting. ?Ear/Nose/Throat: Hearing diminished, nares w/o erythema or drainage ?Eyes: Conjunctiva clear. Sclera non-icteric ?Neck:  Supple.  Trachea midline ?Pulmonary:  Good air movement, no use of accessory muscles.  ?Cardiac: RRR, no JVD ?Vascular:  ?Vessel Right Left  ?Radial Palpable Palpable  ?    ?    ?    ?    ?    ?    ?    ?    ? ?  Gastrointestinal: soft, non-tender/non-distended. No guarding/reflex.  Mildly increased aortic impulse ?Musculoskeletal: M/S 5/5 throughout.  No deformity or atrophy.  No lower extremity edema. ?Neurologic: Sensation grossly intact in extremities.  Symmetrical.  Speech is fluent.  ?Psychiatric: Judgment intact, Mood & affect appropriate for pt's clinical situation. ?Dermatologic: No rashes or ulcers noted.  No cellulitis or open wounds. ? ? ? ? ? ?Labs ?Recent Results (from the past 2160 hour(s))  ?TSH     Status: None  ? Collection Time: 11/05/21 11:59 AM  ?Result Value Ref Range  ? TSH 3.79 0.40 - 4.50 mIU/L  ?T4, free     Status: None  ? Collection Time: 11/05/21 11:59 AM  ?Result Value Ref Range  ? Free T4 1.4 0.8 - 1.8 ng/dL  ?CBC with Differential/Platelet     Status: Abnormal  ? Collection Time: 11/05/21 11:59 AM  ?Result Value Ref Range  ? WBC 7.0 3.8 - 10.8 Thousand/uL  ? RBC 4.45 3.80 - 5.10 Million/uL  ? Hemoglobin 10.5 (L) 11.7 - 15.5 g/dL  ? HCT 35.2 35.0 - 45.0 %  ? MCV 79.1 (L) 80.0 - 100.0 fL  ? MCH 23.6 (L) 27.0 - 33.0 pg  ? MCHC 29.8 (L) 32.0 - 36.0 g/dL  ? RDW 14.4 11.0 - 15.0 %  ? Platelets 346 140 - 400 Thousand/uL  ? MPV 9.8 7.5 - 12.5 fL  ? Neutro Abs 4,522 1,500 - 7,800 cells/uL  ? Lymphs Abs 1,743 850 - 3,900 cells/uL  ? Absolute Monocytes 693 200 - 950 cells/uL  ? Eosinophils Absolute 21 15 - 500 cells/uL  ? Basophils Absolute 21 0 - 200 cells/uL  ? Neutrophils Relative % 64.6 %  ? Total Lymphocyte 24.9 %  ? Monocytes Relative 9.9 %  ? Eosinophils Relative 0.3 %  ? Basophils Relative 0.3 %  ?BASIC METABOLIC PANEL WITH GFR     Status: Abnormal  ? Collection Time: 11/05/21 11:59 AM  ?Result Value Ref Range  ? Glucose, Bld 97 65 - 139 mg/dL  ?  Comment: . ?       Non-fasting reference  interval ?. ?  ? BUN 10 7 - 25 mg/dL  ? Creat 0.90 0.60 - 0.95 mg/dL  ? eGFR 60 > OR = 60 mL/min/1.77m  ?  Comment: The eGFR is based on the CKD-EPI 2021 equation. To calculate  ?the new eGFR from a previous C

## 2021-11-15 ENCOUNTER — Telehealth: Payer: Self-pay

## 2021-11-15 NOTE — Telephone Encounter (Signed)
Copied from [place] [id number]. Topic: General - Other ?>> Nov 09, 2021  3:55 PM Tessa Lerner A wrote: ?Reason for CRM: [name] would like to be contacted to review the patient's recent labwork from 11/05/21 ? ?Please contact further when available ?

## 2021-12-17 ENCOUNTER — Encounter: Payer: Self-pay | Admitting: Family Medicine

## 2021-12-17 ENCOUNTER — Ambulatory Visit (INDEPENDENT_AMBULATORY_CARE_PROVIDER_SITE_OTHER): Payer: Medicare Other | Admitting: Family Medicine

## 2021-12-17 DIAGNOSIS — G2581 Restless legs syndrome: Secondary | ICD-10-CM

## 2021-12-17 NOTE — Progress Notes (Unsigned)
Subjective:    Patient ID: Brittney Simpson, female    DOB: 1927/12/12, 88 y.o.   MRN: [id number]  Brittney Simpson is a 88 y.o. female presenting on 12/17/2021 for restless legs and Follow-up   HPI  HTN CKD Recurrent UTI Followed by Dr Juleen China On antibiotic prophylaxis still with bactrim daily also now on PRN UTI rx per Nephrology Amoxicillin   Fall backwards, had issue with Right Hip Pain. Following with Pain Management.   RLS Essential Temors Describes whole body quivering and tremor. Keeping up at night with RLS Episodes of sweating, perspiring at times.  ***gabapentin, helpful - but dizzy in am, try every other night, helps   Currently eating well and sleeping well. Less active due to worsening functional pain on her back.   Anemia Prior lab with anemia, Microcytic low Hgb. Due for labs. Admits fatigue and tired.   Anxiety Improved managed on Clonazepam Not due for refill, has on file.   Health Maintenance: ***     10/03/2021   10:54 AM 09/18/2021   10:42 AM 04/24/2021   10:38 AM  Depression screen PHQ 2/9  Decreased Interest 0 0 0  Down, Depressed, Hopeless 0 0 0  PHQ - 2 Score 0 0 0    Social History   Tobacco Use   Smoking status: Never   Smokeless tobacco: Never  Vaping Use   Vaping Use: Never used  Substance Use Topics   Alcohol use: No    Alcohol/week: 0.0 standard drinks   Drug use: No    Review of Systems Per HPI unless specifically indicated above     Objective:    BP 128/64   Pulse 62   Ht 5' 3" (1.6 m)   Wt 121 lb (54.9 kg)   SpO2 97%   BMI 21.43 kg/m   Wt Readings from Last 3 Encounters:  12/17/21 121 lb (54.9 kg)  11/13/21 121 lb (54.9 kg)  11/05/21 122 lb 6.4 oz (55.5 kg)    Physical Exam Results for orders placed or performed in visit on 11/05/21  TSH  Result Value Ref Range   TSH 3.79 0.40 - 4.50 mIU/L  T4, free  Result Value Ref Range   Free T4 1.4 0.8 - 1.8 ng/dL  CBC with Differential/Platelet   Result Value Ref Range   WBC 7.0 3.8 - 10.8 Thousand/uL   RBC 4.45 3.80 - 5.10 Million/uL   Hemoglobin 10.5 (L) 11.7 - 15.5 g/dL   HCT 35.2 35.0 - 45.0 %   MCV 79.1 (L) 80.0 - 100.0 fL   MCH 23.6 (L) 27.0 - 33.0 pg   MCHC 29.8 (L) 32.0 - 36.0 g/dL   RDW 14.4 11.0 - 15.0 %   Platelets 346 140 - 400 Thousand/uL   MPV 9.8 7.5 - 12.5 fL   Neutro Abs 4,522 1,500 - 7,800 cells/uL   Lymphs Abs 1,743 850 - 3,900 cells/uL   Absolute Monocytes 693 200 - 950 cells/uL   Eosinophils Absolute 21 15 - 500 cells/uL   Basophils Absolute 21 0 - 200 cells/uL   Neutrophils Relative % 64.6 %   Total Lymphocyte 24.9 %   Monocytes Relative 9.9 %   Eosinophils Relative 0.3 %   Basophils Relative 0.3 %  BASIC METABOLIC PANEL WITH GFR  Result Value Ref Range   Glucose, Bld 97 65 - 139 mg/dL   BUN 10 7 - 25 mg/dL   Creat 0.90 0.60 - 0.95 mg/dL   eGFR 60 >  OR = 60 mL/min/1.73m2   BUN/Creatinine Ratio NOT APPLICABLE 6 - 22 (calc)   Sodium 133 (L) 135 - 146 mmol/L   Potassium 4.7 3.5 - 5.3 mmol/L   Chloride 96 (L) 98 - 110 mmol/L   CO2 27 20 - 32 mmol/L   Calcium 9.0 8.6 - 10.4 mg/dL      Assessment & Plan:   Problem List Items Addressed This Visit   None Visit Diagnoses     RLS (restless legs syndrome)       Relevant Medications   gabapentin (NEURONTIN) 100 MG capsule       No orders of the defined types were placed in this encounter.     Follow up plan: Return in about 6 months (around 06/19/2022) for 6 month Yearly Checkup, AM apt can do labs after.  ***Future labs ordered for ***  A total of *** (lvl 3, 4, 5) 15, 25, 40 minutes was spent face-to-face with this patient. Greater than 50% (approximately *** minutes) of this time was spent in counseling and coordination of care with the patient. ***   , DO [facility] 12/17/2021, 9:41 AM 

## 2021-12-17 NOTE — Patient Instructions (Addendum)
Thank you for coming to the office today.  Keep on Gabapentin every other night '100mg'$   All other meds look good. Will refill when ready, send to [facility]  Please schedule a Follow-up Appointment to: Return in about 6 months (around 06/19/2022) for 6 month Yearly Checkup, AM apt can do labs after.  If you have any other questions or concerns, please feel free to call the office or send a message through [facility]. You may also schedule an earlier appointment if necessary.  Additionally, you may be receiving a survey about your experience at our office within a few days to 1 week by e-mail or mail. We value your feedback.  Nobie Putnam, DO [place]

## 2022-01-16 DIAGNOSIS — I129 Hypertensive chronic kidney disease with stage 1 through stage 4 chronic kidney disease, or unspecified chronic kidney disease: Secondary | ICD-10-CM | POA: Diagnosis not present

## 2022-01-16 DIAGNOSIS — N390 Urinary tract infection, site not specified: Secondary | ICD-10-CM | POA: Diagnosis not present

## 2022-01-16 DIAGNOSIS — N189 Chronic kidney disease, unspecified: Secondary | ICD-10-CM | POA: Diagnosis not present

## 2022-01-16 DIAGNOSIS — E871 Hypo-osmolality and hyponatremia: Secondary | ICD-10-CM | POA: Diagnosis not present

## 2022-01-18 ENCOUNTER — Telehealth: Payer: Self-pay

## 2022-01-21 ENCOUNTER — Other Ambulatory Visit: Payer: Self-pay | Admitting: Pain Medicine

## 2022-01-21 DIAGNOSIS — S22080S Wedge compression fracture of T11-T12 vertebra, sequela: Secondary | ICD-10-CM

## 2022-01-21 DIAGNOSIS — G8929 Other chronic pain: Secondary | ICD-10-CM

## 2022-01-21 DIAGNOSIS — M51379 Other intervertebral disc degeneration, lumbosacral region without mention of lumbar back pain or lower extremity pain: Secondary | ICD-10-CM

## 2022-01-21 DIAGNOSIS — M5137 Other intervertebral disc degeneration, lumbosacral region: Secondary | ICD-10-CM

## 2022-01-22 ENCOUNTER — Ambulatory Visit: Payer: Medicare Other | Attending: Pain Medicine | Admitting: Pain Medicine

## 2022-01-22 ENCOUNTER — Ambulatory Visit
Admission: RE | Admit: 2022-01-22 | Discharge: 2022-01-22 | Disposition: A | Discharge status: Home / Self Care | Payer: Medicare Other | Source: Ambulatory Visit | Attending: Pain Medicine | Admitting: Pain Medicine

## 2022-01-22 ENCOUNTER — Encounter: Payer: Self-pay | Admitting: Pain Medicine

## 2022-01-22 VITALS — BP 167/79 | HR 58 | Temp 97.2°F | Resp 16 | Ht 63.0 in | Wt 121.0 lb

## 2022-01-22 DIAGNOSIS — M48062 Spinal stenosis, lumbar region with neurogenic claudication: Secondary | ICD-10-CM | POA: Insufficient documentation

## 2022-01-22 DIAGNOSIS — G8929 Other chronic pain: Secondary | ICD-10-CM | POA: Insufficient documentation

## 2022-01-22 DIAGNOSIS — M5416 Radiculopathy, lumbar region: Secondary | ICD-10-CM | POA: Insufficient documentation

## 2022-01-22 DIAGNOSIS — M4317 Spondylolisthesis, lumbosacral region: Secondary | ICD-10-CM | POA: Insufficient documentation

## 2022-01-22 DIAGNOSIS — M5135 Other intervertebral disc degeneration, thoracolumbar region: Secondary | ICD-10-CM | POA: Diagnosis not present

## 2022-01-22 DIAGNOSIS — S22080S Wedge compression fracture of T11-T12 vertebra, sequela: Secondary | ICD-10-CM | POA: Insufficient documentation

## 2022-01-22 DIAGNOSIS — M545 Low back pain, unspecified: Secondary | ICD-10-CM | POA: Insufficient documentation

## 2022-01-22 MED ORDER — TRIAMCINOLONE ACETONIDE 40 MG/ML IJ SUSP
40.0000 mg | Freq: Once | INTRAMUSCULAR | Status: AC
  Administered 2022-01-22: 40 mg
  Filled 2022-01-22: qty 1

## 2022-01-22 MED ORDER — IOHEXOL 180 MG/ML  SOLN
10.0000 mL | Freq: Once | INTRAMUSCULAR | Status: AC
  Administered 2022-01-22: 10 mL via EPIDURAL
  Filled 2022-01-22: qty 20

## 2022-01-22 MED ORDER — SODIUM CHLORIDE 0.9% FLUSH
2.0000 mL | Freq: Once | INTRAVENOUS | Status: AC
  Administered 2022-01-22: 2 mL

## 2022-01-22 MED ORDER — PENTAFLUOROPROP-TETRAFLUOROETH EX AERO
INHALATION_SPRAY | Freq: Once | CUTANEOUS | Status: AC
  Administered 2022-01-22: 30 via TOPICAL
  Filled 2022-01-22: qty 116

## 2022-01-22 MED ORDER — ROPIVACAINE HCL 2 MG/ML IJ SOLN
2.0000 mL | Freq: Once | INTRAMUSCULAR | Status: AC
  Administered 2022-01-22: 2 mL via EPIDURAL
  Filled 2022-01-22: qty 20

## 2022-01-22 MED ORDER — LIDOCAINE HCL 2 % IJ SOLN
20.0000 mL | Freq: Once | INTRAMUSCULAR | Status: AC
  Administered 2022-01-22: 400 mg
  Filled 2022-01-22: qty 40

## 2022-01-23 ENCOUNTER — Telehealth: Payer: Self-pay

## 2022-01-23 NOTE — Telephone Encounter (Signed)
Post procedure phone call.  Spoke to daughter.  She states she will call her mom in an hour and let us know if there are any problems.

## 2022-02-03 NOTE — Progress Notes (Signed)
Patient: Brittney Simpson  Service Category: E/M  Provider: Gaspar Cola, MD  DOB: [birth year]  DOS: 02/05/2022  Location: Office  MRN: [id number]  Setting: Ambulatory outpatient  Referring Provider: Nobie Putnam *  Type: Established Patient  Specialty: Interventional Pain Management  PCP: Olin Hauser, DO  Location: Remote location  Delivery: TeleHealth     Virtual Encounter - Pain Management PROVIDER NOTE: Information contained herein reflects review and annotations entered in association with encounter. Interpretation of such information and data should be left to medically-trained personnel. Information provided to patient can be located elsewhere in the medical record under "Patient Instructions". Document created using STT-dictation technology, any transcriptional errors that may result from process are unintentional.    Contact & Pharmacy Preferred: [facility]: [phone] (home) Mobile: [phone] (mobile) E-mail: [email or web address]  [place], [place]. [place] [address] Phone: [phone] Fax: [phone]   Pre-screening  Brittney Simpson offered "in-person" vs "virtual" encounter. She indicated preferring virtual for this encounter.   Reason COVID-19*  Social distancing based on CDC and AMA recommendations.   I contacted Brittney Simpson on 02/05/2022 via telephone.      I clearly identified myself as Gaspar Cola, MD. I verified that I was speaking with the correct person using two identifiers (Name: Brittney Simpson, and date of birth: 1928-03-01).  Consent I sought verbal advanced consent from Brittney Simpson for virtual visit interactions. I informed Brittney Simpson of possible security and privacy concerns, risks, and limitations associated with providing "not-in-person" medical evaluation and management services. I also informed Brittney Simpson of the availability of "in-person"  appointments. Finally, I informed her that there would be a charge for the virtual visit and that she could be  personally, fully or partially, financially responsible for it. Ms. Coller expressed understanding and agreed to proceed.   Historic Elements   Brittney Simpson is a 88 y.o. year old, female patient evaluated today after our last contact on 01/22/2022. Brittney Simpson  has a past medical history of Anxiety, Arthritis, Back ache, Hiatal hernia, Hyperlipidemia, Hypertension, Hypothyroid, Osteopenia, Thyroid disease, and UTI (urinary tract infection). She also  has a past surgical history that includes Hernia repair; Hernia repair; Hemorrhoid surgery; Kyphoplasty (N/A, 01/23/2017); Kyphoplasty (N/A, 01/25/2020); and Cholecystectomy. Brittney Simpson has a current medication list which includes the following prescription(s): acetaminophen, amlodipine, vitamin d3, clonazepam, cranberry, furosemide, gabapentin, ibuprofen, latanoprost, levothyroxine, lisinopril, melatonin, metoprolol succinate, omeprazole, sulfamethoxazole-trimethoprim, tamsulosin, tramadol, trazodone, and methocarbamol. She  reports that she has never smoked. She has never used smokeless tobacco. She reports that she does not drink alcohol and does not use drugs. Brittney Simpson has No Known Allergies.   HPI  Today, she is being contacted for a post-procedure assessment.  Post-procedure evaluation   Type: Lumbar epidural steroid injection (LESI) (interlaminar) #6    Laterality: Right   Level:   T12-L1  Level.  Imaging: Fluoroscopic guidance Anesthesia: Local anesthesia (1-2% Lidocaine) Anxiolysis: None                 Sedation: None. DOS: 01/22/2022  Performed by: Gaspar Cola, MD  Purpose: Diagnostic/Therapeutic Indications: Lumbar radicular pain of intraspinal etiology of more than 4 weeks that has failed to respond to conservative therapy and is severe enough to impact quality of life or function. 1. DDD (degenerative  disc disease), thoracolumbar   2. Spinal stenosis of lumbar region with neurogenic claudication   3. T12  compression fracture, sequela   4. Spondylolisthesis of lumbosacral region (L2-3 and L5-S1)   5. Chronic low back pain (1ry area of Pain) (Bilateral) (R>L) w/o sciatica   6. Chronic lumbar radicular pain (Right)    NAS-11 Pain score:   Pre-procedure: 9 /10   Post-procedure: 5 /10      Effectiveness:  Initial hour after procedure: 100 %. Subsequent 4-6 hours post-procedure: 100 %. Analgesia past initial 6 hours: 80 % (ongoing). Ongoing improvement:  Analgesic: The patient indicates having attained an ongoing 80% relief of her low back and lower extremity pain. Function: Brittney Simpson reports improvement in function ROM: Brittney Simpson reports improvement in ROM  Pharmacotherapy Assessment   Opioid Analgesic: Tramadol 50 mg, 1 tab PO q 8 hrs (150 mg/day of tramadol) MME/day: 15 mg/day.   Monitoring: [place] PMP: PDMP reviewed during this encounter.       Pharmacotherapy: No side-effects or adverse reactions reported. Compliance: No problems identified. Effectiveness: Clinically acceptable. Plan: Refer to "POC". UDS:  Summary  Date Value Ref Range Status  01/24/2021 Note  Final    Comment:    ==================================================================== [employer]ox[employer] Select 13 (MW) ==================================================================== Specimen Alert Note: Urinary creatinine is low; ability to detect some drugs may be compromised. Interpret results with caution. (Creatinine) ==================================================================== Test                             Result       Flag       Units  Drug Present and Declared for Prescription Verification   7-aminoclonazepam              287          EXPECTED   ng/mg creat    7-aminoclonazepam is an expected metabolite of clonazepam. Source of    clonazepam is a scheduled prescription medication.     Tramadol                       >33333       EXPECTED   ng/mg creat   O-Desmethyltramadol            21013        EXPECTED   ng/mg creat   N-Desmethyltramadol            7287         EXPECTED   ng/mg creat    Source of tramadol is a prescription medication. O-desmethyltramadol    and N-desmethyltramadol are expected metabolites of tramadol.  ==================================================================== Test                      Result    Flag   Units      Ref Range   Creatinine              15        LL     mg/dL      >=20 ==================================================================== Declared Medications:  The flagging and interpretation on this report are based on the  following declared medications.  Unexpected results may arise from  inaccuracies in the declared medications.   **Note: The testing scope of this panel includes these medications:   Clonazepam (Klonopin)  Tramadol (Ultram)   **Note: The testing scope of this panel does not include the  following reported medications:   Acetaminophen (Tylenol)  Amlodipine  Cranberry  Furosemide  Latanoprost (Xalatan)  Levothyroxine  Lisinopril  Melatonin  Metoprolol  Omeprazole  Sulfamethoxazole (Bactrim)  Tamsulosin (Flomax)  Trazodone  Trimethoprim (Bactrim)  Vitamin D2 ==================================================================== For clinical consultation, please call [phone]. ====================================================================      Laboratory Chemistry Profile   Renal Lab Results  Component Value Date   BUN 10 11/05/2021   CREATININE 0.90 29/47/6546   BCR NOT APPLICABLE 50/35/4656   GFRAA 76 05/16/2020   GFRNONAA 66 05/16/2020    Hepatic Lab Results  Component Value Date   AST 10 03/07/2021   ALT 7 03/07/2021   ALBUMIN 3.6 11/22/2016   ALKPHOS 59 11/22/2016   LIPASE 21 06/14/2016    Electrolytes Lab Results  Component Value Date   NA 133 (L) 11/05/2021    K 4.7 11/05/2021   CL 96 (L) 11/05/2021   CALCIUM 9.0 11/05/2021   MG 2.1 01/14/2014    Bone No results found for: "VD25OH", "VD125OH2TOT", "CL2751ZG0", "FV4944HQ7", "25OHVITD1", "25OHVITD2", "25OHVITD3", "TESTOFREE", "TESTOSTERONE"  Inflammation (CRP: Acute Phase) (ESR: Chronic Phase) No results found for: "CRP", "ESRSEDRATE", "LATICACIDVEN"       Note: Above Lab results reviewed.  Imaging  DG PAIN CLINIC C-ARM 1-60 MIN NO REPORT Fluoro was used, but no Radiologist interpretation will be provided.  Please refer to "NOTES" tab for provider progress note.  Assessment  The primary encounter diagnosis was Chronic low back pain (1ry area of Pain) (Bilateral) (R>L) w/o sciatica. Diagnoses of Chronic lower extremity pain (3ry area of Pain) (Right), Chronic lumbar radicular pain (Right), DDD (degenerative disc disease), thoracolumbar, T12 compression fracture, sequela, and Chronic pain syndrome were also pertinent to this visit.  Plan of Care  Problem-specific:  No problem-specific Assessment & Plan notes found for this encounter.  Brittney Simpson has a current medication list which includes the following long-term medication(s): amlodipine, clonazepam, furosemide, gabapentin, levothyroxine, lisinopril, metoprolol succinate, omeprazole, tramadol, trazodone, and methocarbamol.  Pharmacotherapy (Medications Ordered): No orders of the defined types were placed in this encounter.  Orders:  Orders Placed This Encounter  Procedures   Lumbar Epidural Injection    Standing Status:   Standing    Number of Occurrences:   6    Standing Expiration Date:   02/06/2023    Scheduling Instructions:     Procedure: Interlaminar Lumbar Epidural Steroid injection (LESI)  T12-L1     Laterality: Right-sided     Sedation: None required.     Timeframe: PRN    Order Specific Question:   Where will this procedure be performed?    Answer:   [facility]   Follow-up plan:   Return if symptoms  worsen or fail to improve, for (Clinic) procedure: (R) T12-L1 LESI (PRN).     Interventional Therapies  Risk  Complexity Considerations:   Estimated body mass index is 20.19 kg/m as calculated from the following:   Height as of this encounter: _0  (1.6 m).   Weight as of this encounter: 114 lb (51.7 kg). Note: Hard of hearing   Planned  Pending:      Under consideration:   Diagnostic right hip joint injection  Diagnostic right trochanteric bursa injection  Diagnostic midline caudal ESI (for the tailbone pain)   Completed:   Palliative right T12-L1 LESI x6 (01/22/2022) (100/100/80/80)  Therapeutic right L2-3 LESI x1 (03/07/2020) (100/100/25/<50)  Diagnostic/therapeutic bilateral SI joint injection x1 (02/10/2020) (100/100/100 x2 days/0)  Palliative right lumbar facet MBB x13 (01/04/2020) (8 to 0) (100/100/100)  Palliative left lumbar facet MBB x11 (01/04/2020) (8 to 0) (100/100/100)    Therapeutic  Palliative (  PRN) options:   Palliative T12-L1 LESI     Recent Visits Date Type Provider Dept  01/22/22 Procedure visit Milinda Pointer, MD [facility]  Showing recent visits within past 90 days and meeting all other requirements Today's Visits Date Type Provider Dept  02/05/22 Office Visit Milinda Pointer, MD [facility]  Showing today's visits and meeting all other requirements Future Appointments Date Type Provider Dept  04/10/22 Appointment Milinda Pointer, MD [facility]  Showing future appointments within next 90 days and meeting all other requirements  I discussed the assessment and treatment plan with the patient. The patient was provided an opportunity to ask questions and all were answered. The patient agreed with the plan and demonstrated an understanding of the instructions.  Patient advised to call back or seek an in-person evaluation if the symptoms or condition worsens.  Duration of encounter: 15 minutes.  Note by: Gaspar Cola, MD Date: 02/05/2022; Time: 3:50 PM

## 2022-02-04 ENCOUNTER — Telehealth: Payer: Self-pay

## 2022-02-04 NOTE — Telephone Encounter (Signed)
LM for patients daughter to call office for pre virtual appointment  questions

## 2022-02-05 ENCOUNTER — Ambulatory Visit: Payer: Medicare Other | Attending: Pain Medicine | Admitting: Pain Medicine

## 2022-02-05 DIAGNOSIS — M5416 Radiculopathy, lumbar region: Secondary | ICD-10-CM | POA: Diagnosis not present

## 2022-02-05 DIAGNOSIS — M5135 Other intervertebral disc degeneration, thoracolumbar region: Secondary | ICD-10-CM

## 2022-02-05 DIAGNOSIS — M79604 Pain in right leg: Secondary | ICD-10-CM

## 2022-02-05 DIAGNOSIS — G894 Chronic pain syndrome: Secondary | ICD-10-CM | POA: Diagnosis not present

## 2022-02-05 DIAGNOSIS — G8929 Other chronic pain: Secondary | ICD-10-CM

## 2022-02-05 DIAGNOSIS — M545 Low back pain, unspecified: Secondary | ICD-10-CM | POA: Diagnosis not present

## 2022-02-05 DIAGNOSIS — S22080S Wedge compression fracture of T11-T12 vertebra, sequela: Secondary | ICD-10-CM | POA: Diagnosis not present

## 2022-02-06 ENCOUNTER — Other Ambulatory Visit: Payer: Self-pay | Admitting: Family Medicine

## 2022-02-06 DIAGNOSIS — F419 Anxiety disorder, unspecified: Secondary | ICD-10-CM

## 2022-02-06 NOTE — Telephone Encounter (Signed)
Requested medication (s) are due for refill today: yes  Requested medication (s) are on the active medication list: yes    Last refill: 10/26/21  #30  2 refills  Future visit scheduled With nurse  03/19/22  Notes to clinic:Not delegated,please review.  Requested Prescriptions  Pending Prescriptions Disp Refills   clonazePAM (KLONOPIN) 0.5 MG tablet [Pharmacy Med Name: CLONAZEPAM 0.5 MG TAB] 30 tablet     Sig: TAKE 1/2 TABLET BY MOUTH TWICE DAILY     Not Delegated - Psychiatry: Anxiolytics/Hypnotics 2 Failed - 02/06/2022 11:09 AM      Failed - This refill cannot be delegated      Failed - Urine Drug Screen completed in last 360 days      Passed - Patient is not pregnant      Passed - Valid encounter within last 6 months    Recent Outpatient Visits           1 month ago RLS (restless legs syndrome)   [facility], Devonne Doughty, DO   3 months ago Benign hypertension with CKD (chronic kidney disease) stage III [facility])   [place], [place]   10 months ago Benign hypertension with CKD (chronic kidney disease) stage III [facility])   [facility] Olin Hauser, DO   11 months ago RUQ abdominal pain   [facility] Olin Hauser, DO   1 year ago OAB (overactive bladder)   [facility] [place], Devonne Doughty, DO       Future Appointments             In 1 month  [facility], [place]   In 2 months MacDiarmid, Nicki Reaper, MD [place]   In 4 months [place], Devonne Doughty, [facility], [facility]

## 2022-02-25 DIAGNOSIS — H401133 Primary open-angle glaucoma, bilateral, severe stage: Secondary | ICD-10-CM | POA: Diagnosis not present

## 2022-02-28 ENCOUNTER — Other Ambulatory Visit: Payer: Self-pay | Admitting: Family Medicine

## 2022-02-28 DIAGNOSIS — E034 Atrophy of thyroid (acquired): Secondary | ICD-10-CM

## 2022-03-01 NOTE — Telephone Encounter (Signed)
Requested Prescriptions  Pending Prescriptions Disp Refills  . levothyroxine (SYNTHROID) 112 MCG tablet [Pharmacy Med Name: LEVOTHYROXINE SODIUM 112 MCG TAB] 90 tablet 2    Sig: TAKE 1 TABLET BY MOUTH ONCE DAILY ON AN EMPTY STOMACH. WAIT 30 MINUTES BEFORE TAKING OTHER MEDS.     Endocrinology:  Hypothyroid Agents Passed - 02/28/2022  3:22 PM      Passed - TSH in normal range and within 360 days    TSH  Date Value Ref Range Status  11/05/2021 3.79 0.40 - 4.50 mIU/L Final         Passed - Valid encounter within last 12 months    Recent Outpatient Visits          2 months ago RLS (restless legs syndrome)   [facility], Devonne Doughty, DO   3 months ago Benign hypertension with CKD (chronic kidney disease) stage III [facility])   [place], DO   11 months ago Benign hypertension with CKD (chronic kidney disease) stage III [facility])   [facility] Olin Hauser, DO   11 months ago RUQ abdominal pain   [place], DO   1 year ago OAB (overactive bladder)   [place], DO      Future Appointments            In 2 weeks  [facility], [place]   In 2 months MacDiarmid, Nicki Reaper, MD [place]   In 3 months [place], Devonne Doughty, [facility], [facility]

## 2022-03-07 ENCOUNTER — Other Ambulatory Visit: Payer: Self-pay | Admitting: Pain Medicine

## 2022-03-07 ENCOUNTER — Other Ambulatory Visit: Payer: Self-pay | Admitting: Family Medicine

## 2022-03-07 DIAGNOSIS — Z79891 Long term (current) use of opiate analgesic: Secondary | ICD-10-CM

## 2022-03-07 DIAGNOSIS — Z79899 Other long term (current) drug therapy: Secondary | ICD-10-CM

## 2022-03-07 DIAGNOSIS — G894 Chronic pain syndrome: Secondary | ICD-10-CM

## 2022-03-07 DIAGNOSIS — G8929 Other chronic pain: Secondary | ICD-10-CM

## 2022-03-07 DIAGNOSIS — M5135 Other intervertebral disc degeneration, thoracolumbar region: Secondary | ICD-10-CM

## 2022-03-07 DIAGNOSIS — I129 Hypertensive chronic kidney disease with stage 1 through stage 4 chronic kidney disease, or unspecified chronic kidney disease: Secondary | ICD-10-CM

## 2022-03-07 DIAGNOSIS — M545 Low back pain, unspecified: Secondary | ICD-10-CM

## 2022-03-07 DIAGNOSIS — M4854XS Collapsed vertebra, not elsewhere classified, thoracic region, sequela of fracture: Secondary | ICD-10-CM

## 2022-03-07 DIAGNOSIS — M47816 Spondylosis without myelopathy or radiculopathy, lumbar region: Secondary | ICD-10-CM

## 2022-03-07 DIAGNOSIS — M5137 Other intervertebral disc degeneration, lumbosacral region: Secondary | ICD-10-CM

## 2022-03-07 DIAGNOSIS — F5101 Primary insomnia: Secondary | ICD-10-CM

## 2022-03-07 DIAGNOSIS — S22080S Wedge compression fracture of T11-T12 vertebra, sequela: Secondary | ICD-10-CM

## 2022-03-07 NOTE — Telephone Encounter (Signed)
Requested Prescriptions  Pending Prescriptions Disp Refills  . traZODone (DESYREL) 50 MG tablet [Pharmacy Med Name: TRAZODONE HCL 50 MG TAB] 90 tablet 3    Sig: TAKE 1 TABLET BY MOUTH AT BEDTIME     Psychiatry: Antidepressants - Serotonin Modulator Passed - 03/07/2022 10:14 AM      Passed - Valid encounter within last 6 months    Recent Outpatient Visits          2 months ago RLS (restless legs syndrome)   [facility], Devonne Doughty, DO   4 months ago Benign hypertension with CKD (chronic kidney disease) stage III [facility])   [facility], Devonne Doughty, DO   11 months ago Benign hypertension with CKD (chronic kidney disease) stage III ([place])   [facility] Olin Hauser, DO   1 year ago RUQ abdominal pain   [facility] Olin Hauser, DO   1 year ago OAB (overactive bladder)   [place], Devonne Doughty, DO      Future Appointments            In 1 week  [facility], [place]   In 2 months MacDiarmid, Nicki Reaper, MD [place]   In 3 months [place], Devonne Doughty, DO [facility], [facility]           . amLODipine ([place]) 5 MG tablet [Pharmacy Med Name: AMLODIPINE BESYLATE 5 MG TAB] 90 tablet 3    Sig: TAKE 1 TABLET BY MOUTH ONCE DAILY WITH LUNCH     Cardiovascular: Calcium Channel Blockers 2 Failed - 03/07/2022 10:14 AM      Failed - Last BP in normal range    BP Readings from Last 1 Encounters:  01/22/22 (!) 167/79         Passed - Last Heart Rate in normal range    Pulse Readings from Last 1 Encounters:  01/22/22 (!) 58         Passed - Valid encounter within last 6 months    Recent Outpatient Visits          2 months ago RLS (restless legs syndrome)   [place], DO   4 months ago Benign hypertension with CKD (chronic kidney disease) stage III [facility])   [place], DO   11 months ago Benign hypertension with CKD (chronic kidney disease) stage III [facility])   [place], DO   1 year ago RUQ abdominal pain   [facility] Olin Hauser, DO   1 year ago OAB (overactive bladder)   [facility] [place], Devonne Doughty, DO      Future Appointments            In 1 week  [facility], [place]   In 2 months MacDiarmid, Nicki Reaper, MD [place]   In 3 months [place], Devonne Doughty, [facility], [facility]

## 2022-03-19 ENCOUNTER — Ambulatory Visit: Payer: Medicare Other

## 2022-03-21 ENCOUNTER — Other Ambulatory Visit: Payer: Self-pay | Admitting: Family Medicine

## 2022-03-21 DIAGNOSIS — K219 Gastro-esophageal reflux disease without esophagitis: Secondary | ICD-10-CM

## 2022-03-21 NOTE — Telephone Encounter (Signed)
Requested Prescriptions  Pending Prescriptions Disp Refills  . omeprazole (PRILOSEC) 20 MG capsule [Pharmacy Med Name: OMEPRAZOLE DR 20 MG CAP] 90 capsule 0    Sig: TAKE 1 CAPSULE BY MOUTH ONCE DAILY BEFORE BREAKFAST     Gastroenterology: Proton Pump Inhibitors Passed - 03/21/2022 10:50 AM      Passed - Valid encounter within last 12 months    Recent Outpatient Visits          3 months ago RLS (restless legs syndrome)   [facility], Devonne Doughty, DO   4 months ago Benign hypertension with CKD (chronic kidney disease) stage III [facility])   [place], DO   11 months ago Benign hypertension with CKD (chronic kidney disease) stage III [facility])   [place], DO   1 year ago RUQ abdominal pain   [place], DO   1 year ago OAB (overactive bladder)   [facility] Olin Hauser, DO      Future Appointments            In 1 month MacDiarmid, Nicki Reaper, MD [place]   In 3 months [place], Devonne Doughty, [facility], [facility]

## 2022-04-09 NOTE — Progress Notes (Unsigned)
PROVIDER NOTE: Information contained herein reflects review and annotations entered in association with encounter. Interpretation of such information and data should be left to medically-trained personnel. Information provided to patient can be located elsewhere in the medical record under "Patient Instructions". Document created using STT-dictation technology, any transcriptional errors that may result from process are unintentional.    Patient: Brittney Simpson  Service Category: E/M  Provider: Gaspar Cola, MD  DOB: 1928/06/23  DOS: 04/10/2022  Referring Provider: Nobie Putnam *  MRN: [id number]  Specialty: Interventional Pain Management  PCP: Olin Hauser, DO  Type: Established Patient  Setting: Ambulatory outpatient    Location: Office  Delivery: Face-to-face     HPI  [name], a 88 y.o. year old female, is here today because of her No primary diagnosis found.. Brittney Simpson's primary complain today is No chief complaint on file. Last encounter: My last encounter with her was on 03/07/2022. Pertinent problems: Brittney Simpson has Lumbar facet syndrome (Bilateral) (R>L); Chronic low back pain (1ry area of Pain) (Bilateral) (R>L) w/o sciatica; Spondylolisthesis of lumbosacral region (L2-3 and L5-S1); Lumbar spinal stenosis (9 mm at L3-4); Trochanteric bursitis (Right); Chronic hip pain (2ry area of Pain) (Right); Osteoarthritis of hip (Right); Chronic sacroiliac joint pain (Bilateral) (R>L); Chronic lumbar radicular pain (Right); Chronic lower extremity pain (3ry area of Pain) (Right); Myofascial pain; Spondylosis without myelopathy or radiculopathy, lumbosacral region; Ankle edema, bilateral; Abnormal MRI, lumbar spine (01/10/2017); T12 compression fracture, sequela; Lumbar facet hypertrophy; Lumbar lateral recess stenosis; Lumbar foraminal stenosis; Non-traumatic compression fracture of T7 thoracic vertebra, sequela; Chronic pain syndrome; DDD (degenerative disc  disease), lumbosacral; Edema of lower extremity; Somatic dysfunction of sacroiliac joints (Bilateral); Other spondylosis, sacral and sacrococcygeal region; Spasm of back muscles; and DDD (degenerative disc disease), thoracolumbar on their pertinent problem list. Pain Assessment: Severity of   is reported as a  /10. Location:    / . Onset:  . Quality:  . Timing:  . Modifying factor(s):  Marland Kitchen Vitals:  vitals were not taken for this visit.   Reason for encounter: medication management. ***  Pharmacotherapy Assessment  Analgesic: Tramadol 50 mg, 1 tab PO q 8 hrs (150 mg/day of tramadol) MME/day: 15 mg/day.   Monitoring: [place] PMP: PDMP reviewed during this encounter.       Pharmacotherapy: No side-effects or adverse reactions reported. Compliance: No problems identified. Effectiveness: Clinically acceptable.  No notes on file  No results found for: "CBDTHCR" No results found for: "D8THCCBX" No results found for: "D9THCCBX"  UDS:  Summary  Date Value Ref Range Status  01/24/2021 Note  Final    Comment:    ==================================================================== [employer] [facility] (MW) ==================================================================== Specimen Alert Note: Urinary creatinine is low; ability to detect some drugs may be compromised. Interpret results with caution. (Creatinine) ==================================================================== Test                             Result       Flag       Units  Drug Present and Declared for Prescription Verification   7-aminoclonazepam              287          EXPECTED   ng/mg creat    7-aminoclonazepam is an expected metabolite of clonazepam. Source of    clonazepam is a scheduled prescription medication.    Tramadol                       >  12197       EXPECTED   ng/mg creat   O-Desmethyltramadol            21013        EXPECTED   ng/mg creat   N-Desmethyltramadol            7287         EXPECTED   ng/mg creat     Source of tramadol is a prescription medication. O-desmethyltramadol    and N-desmethyltramadol are expected metabolites of tramadol.  ==================================================================== Test                      Result    Flag   Units      Ref Range   Creatinine              15        LL     mg/dL      >=20 ==================================================================== Declared Medications:  The flagging and interpretation on this report are based on the  following declared medications.  Unexpected results may arise from  inaccuracies in the declared medications.   **Note: The testing scope of this panel includes these medications:   Clonazepam (Klonopin)  Tramadol (Ultram)   **Note: The testing scope of this panel does not include the  following reported medications:   Acetaminophen (Tylenol)  Amlodipine  Cranberry  Furosemide  Latanoprost (Xalatan)  Levothyroxine  Lisinopril  Melatonin  Metoprolol  Omeprazole  Sulfamethoxazole (Bactrim)  Tamsulosin (Flomax)  Trazodone  Trimethoprim (Bactrim)  Vitamin D2 ==================================================================== For clinical consultation, please call [phone]. ====================================================================       ROS  Constitutional: Denies any fever or chills Gastrointestinal: No reported hemesis, hematochezia, vomiting, or acute GI distress Musculoskeletal: Denies any acute onset joint swelling, redness, loss of ROM, or weakness Neurological: No reported episodes of acute onset apraxia, aphasia, dysarthria, agnosia, amnesia, paralysis, loss of coordination, or loss of consciousness  Medication Review  Cranberry, Melatonin, Vitamin D3, acetaminophen, amLODipine, clonazePAM, furosemide, gabapentin, latanoprost, levothyroxine, lisinopril, methocarbamol, metoprolol succinate, omeprazole, sulfamethoxazole-trimethoprim, tamsulosin, traMADol, and  traZODone  History Review  Allergy: Brittney Simpson has No Known Allergies. Drug: Brittney Simpson  reports no history of drug use. Alcohol:  reports no history of alcohol use. Tobacco:  reports that she has never smoked. She has never used smokeless tobacco. Social: Brittney Simpson  reports that she has never smoked. She has never used smokeless tobacco. She reports that she does not drink alcohol and does not use drugs. Medical:  has a past medical history of Anxiety, Arthritis, Back ache, Hiatal hernia, Hyperlipidemia, Hypertension, Hypothyroid, Osteopenia, Thyroid disease, and UTI (urinary tract infection). Surgical: Brittney Simpson  has a past surgical history that includes Hernia repair; Hernia repair; Hemorrhoid surgery; Kyphoplasty (N/A, 01/23/2017); Kyphoplasty (N/A, 01/25/2020); and Cholecystectomy. Family: family history includes Cancer in her father; Stroke in her mother.  Laboratory Chemistry Profile   Renal Lab Results  Component Value Date   BUN 10 11/05/2021   CREATININE 0.90 64/15/8309   BCR NOT APPLICABLE 40/76/8088   GFRAA 76 05/16/2020   GFRNONAA 66 05/16/2020    Hepatic Lab Results  Component Value Date   AST 10 03/07/2021   ALT 7 03/07/2021   ALBUMIN 3.6 11/22/2016   ALKPHOS 59 11/22/2016   LIPASE 21 06/14/2016    Electrolytes Lab Results  Component Value Date   NA 133 (L) 11/05/2021   K 4.7 11/05/2021   CL 96 (L) 11/05/2021   CALCIUM 9.0 11/05/2021  MG 2.1 01/14/2014    Bone No results found for: "VD25OH", "VD125OH2TOT", "AS3419QQ2", "WL7989QJ1", "25OHVITD1", "25OHVITD2", "25OHVITD3", "TESTOFREE", "TESTOSTERONE"  Inflammation (CRP: Acute Phase) (ESR: Chronic Phase) No results found for: "CRP", "ESRSEDRATE", "LATICACIDVEN"       Note: Above Lab results reviewed.  Recent Imaging Review  DG PAIN CLINIC C-ARM 1-60 MIN NO REPORT Fluoro was used, but no Radiologist interpretation will be provided.  Please refer to "NOTES" tab for provider progress note. Note:  Reviewed        Physical Exam  General appearance: Well nourished, well developed, and well hydrated. In no apparent acute distress Mental status: Alert, oriented x 3 (person, place, & time)       Respiratory: No evidence of acute respiratory distress Eyes: PERLA Vitals: There were no vitals taken for this visit. BMI: Estimated body mass index is 21.43 kg/m as calculated from the following:   Height as of 01/22/22: '5\' 3"'  (1.6 m).   Weight as of 01/22/22: 121 lb (54.9 kg). Ideal: Patient weight not recorded  Assessment   Diagnosis Status  No diagnosis found. Controlled Controlled Controlled   Updated Problems: No problems updated.  Plan of Care  Problem-specific:  No problem-specific Assessment & Plan notes found for this encounter.  Brittney Simpson has a current medication list which includes the following long-term medication(s): amlodipine, clonazepam, furosemide, gabapentin, levothyroxine, lisinopril, methocarbamol, metoprolol succinate, omeprazole, tramadol, and trazodone.  Pharmacotherapy (Medications Ordered): No orders of the defined types were placed in this encounter.  Orders:  No orders of the defined types were placed in this encounter.  Follow-up plan:   No follow-ups on file.     Interventional Therapies  Risk  Complexity Considerations:   Estimated body mass index is 20.19 kg/m as calculated from the following:   Height as of this encounter: '5\' 3"'  (1.6 m).   Weight as of this encounter: 114 lb (51.7 kg). Note: Hard of hearing   Planned  Pending:      Under consideration:   Diagnostic right hip joint injection  Diagnostic right trochanteric bursa injection  Diagnostic midline caudal ESI (for the tailbone pain)   Completed:   Palliative right T12-L1 LESI x6 (01/22/2022) (100/100/80/80)  Therapeutic right L2-3 LESI x1 (03/07/2020) (100/100/25/<50)  Diagnostic/therapeutic bilateral SI joint injection x1 (02/10/2020) (100/100/100 x2 days/0)   Palliative right lumbar facet MBB x13 (01/04/2020) (8 to 0) (100/100/100)  Palliative left lumbar facet MBB x11 (01/04/2020) (8 to 0) (100/100/100)    Therapeutic  Palliative (PRN) options:   Palliative T12-L1 LESI      Recent Visits Date Type Provider Dept  02/05/22 Office Visit Milinda Pointer, MD [facility]  01/22/22 Procedure visit Milinda Pointer, MD [facility]  Showing recent visits within past 90 days and meeting all other requirements Future Appointments Date Type Provider Dept  04/10/22 Appointment Milinda Pointer, MD [facility]  Showing future appointments within next 90 days and meeting all other requirements  I discussed the assessment and treatment plan with the patient. The patient was provided an opportunity to ask questions and all were answered. The patient agreed with the plan and demonstrated an understanding of the instructions.  Patient advised to call back or seek an in-person evaluation if the symptoms or condition worsens.  Duration of encounter: *** minutes.  Total time on encounter, as per AMA guidelines included both the face-to-face and non-face-to-face time personally spent by the physician and/or other qualified health care professional(s) on the day of the  encounter (includes time in activities that require the physician or other qualified health care professional and does not include time in activities normally performed by clinical staff). Physician's time may include the following activities when performed: preparing to see the patient (eg, review of tests, pre-charting review of records) obtaining and/or reviewing separately obtained history performing a medically appropriate examination and/or evaluation counseling and educating the patient/family/caregiver ordering medications, tests, or procedures referring and communicating with other health care professionals (when not separately reported) documenting  clinical information in the electronic or other health record independently interpreting results (not separately reported) and communicating results to the patient/ family/caregiver care coordination (not separately reported)  Note by: Gaspar Cola, MD Date: 04/10/2022; Time: 4:40 PM

## 2022-04-10 ENCOUNTER — Encounter: Payer: Self-pay | Admitting: Pain Medicine

## 2022-04-10 ENCOUNTER — Ambulatory Visit: Payer: Medicare Other | Attending: Pain Medicine | Admitting: Pain Medicine

## 2022-04-10 VITALS — BP 168/76 | HR 56 | Temp 97.2°F | Resp 16 | Ht 63.0 in | Wt 115.0 lb

## 2022-04-10 DIAGNOSIS — M533 Sacrococcygeal disorders, not elsewhere classified: Secondary | ICD-10-CM | POA: Insufficient documentation

## 2022-04-10 DIAGNOSIS — M545 Low back pain, unspecified: Secondary | ICD-10-CM | POA: Diagnosis not present

## 2022-04-10 DIAGNOSIS — S22080S Wedge compression fracture of T11-T12 vertebra, sequela: Secondary | ICD-10-CM | POA: Insufficient documentation

## 2022-04-10 DIAGNOSIS — Z79891 Long term (current) use of opiate analgesic: Secondary | ICD-10-CM | POA: Diagnosis not present

## 2022-04-10 DIAGNOSIS — M5137 Other intervertebral disc degeneration, lumbosacral region: Secondary | ICD-10-CM | POA: Insufficient documentation

## 2022-04-10 DIAGNOSIS — M5135 Other intervertebral disc degeneration, thoracolumbar region: Secondary | ICD-10-CM | POA: Diagnosis not present

## 2022-04-10 DIAGNOSIS — Z79899 Other long term (current) drug therapy: Secondary | ICD-10-CM | POA: Insufficient documentation

## 2022-04-10 DIAGNOSIS — G894 Chronic pain syndrome: Secondary | ICD-10-CM | POA: Insufficient documentation

## 2022-04-10 DIAGNOSIS — G8929 Other chronic pain: Secondary | ICD-10-CM | POA: Diagnosis not present

## 2022-04-10 DIAGNOSIS — M47816 Spondylosis without myelopathy or radiculopathy, lumbar region: Secondary | ICD-10-CM | POA: Insufficient documentation

## 2022-04-10 DIAGNOSIS — M79604 Pain in right leg: Secondary | ICD-10-CM | POA: Insufficient documentation

## 2022-04-10 DIAGNOSIS — M25551 Pain in right hip: Secondary | ICD-10-CM | POA: Insufficient documentation

## 2022-04-10 DIAGNOSIS — M4854XS Collapsed vertebra, not elsewhere classified, thoracic region, sequela of fracture: Secondary | ICD-10-CM | POA: Insufficient documentation

## 2022-04-10 MED ORDER — TRAMADOL HCL 50 MG PO TABS: 50.0000 mg | ORAL_TABLET | Freq: Three times a day (TID) | ORAL | 5 refills | Status: DC

## 2022-04-10 NOTE — Patient Instructions (Signed)

## 2022-04-10 NOTE — Progress Notes (Signed)
Nursing Pain Medication Assessment:  Safety precautions to be maintained throughout the outpatient stay will include: orient to surroundings, keep bed in low position, maintain call bell within reach at all times, provide assistance with transfer out of bed and ambulation.  Medication Inspection Compliance: Brittney Simpson did not comply with our request to bring her pills to be counted. She was reminded that bringing the medication bottles, even when empty, is a requirement.  Medication: None brought in. Pill/Patch Count: None available to be counted. Bottle Appearance: No container available. Did not bring bottle(s) to appointment. Filled Date: 03/07/22 per PMP Last Medication intake:  Brittney Simpson

## 2022-04-13 LAB — TOXASSURE SELECT 13 (MW), URINE

## 2022-04-22 ENCOUNTER — Telehealth: Payer: Self-pay | Admitting: Pain Medicine

## 2022-04-22 NOTE — Telephone Encounter (Signed)
Patient says Dr Dossie Arbour was supposed to put in an order for LESI. She wants to schedule this appt.

## 2022-04-22 NOTE — Telephone Encounter (Signed)
Will speak to Dr Dossie Arbour today

## 2022-04-30 ENCOUNTER — Ambulatory Visit
Admission: RE | Admit: 2022-04-30 | Discharge: 2022-04-30 | Disposition: A | Discharge status: Home / Self Care | Payer: Medicare Other | Source: Ambulatory Visit | Attending: Pain Medicine | Admitting: Pain Medicine

## 2022-04-30 ENCOUNTER — Encounter: Payer: Self-pay | Admitting: Pain Medicine

## 2022-04-30 ENCOUNTER — Ambulatory Visit: Payer: Medicare Other | Attending: Pain Medicine | Admitting: Pain Medicine

## 2022-04-30 VITALS — BP 179/75 | HR 55 | Temp 97.2°F | Resp 16 | Ht 64.0 in | Wt 120.0 lb

## 2022-04-30 DIAGNOSIS — M545 Low back pain, unspecified: Secondary | ICD-10-CM | POA: Diagnosis not present

## 2022-04-30 DIAGNOSIS — M5135 Other intervertebral disc degeneration, thoracolumbar region: Secondary | ICD-10-CM | POA: Insufficient documentation

## 2022-04-30 DIAGNOSIS — M4317 Spondylolisthesis, lumbosacral region: Secondary | ICD-10-CM | POA: Diagnosis not present

## 2022-04-30 DIAGNOSIS — G8929 Other chronic pain: Secondary | ICD-10-CM | POA: Diagnosis not present

## 2022-04-30 DIAGNOSIS — S22080S Wedge compression fracture of T11-T12 vertebra, sequela: Secondary | ICD-10-CM | POA: Insufficient documentation

## 2022-04-30 DIAGNOSIS — M48061 Spinal stenosis, lumbar region without neurogenic claudication: Secondary | ICD-10-CM | POA: Diagnosis not present

## 2022-04-30 DIAGNOSIS — M5416 Radiculopathy, lumbar region: Secondary | ICD-10-CM | POA: Insufficient documentation

## 2022-04-30 MED ORDER — LIDOCAINE HCL (PF) 2 % IJ SOLN
INTRAMUSCULAR | Status: AC
  Filled 2022-04-30: qty 10

## 2022-04-30 MED ORDER — TRIAMCINOLONE ACETONIDE 40 MG/ML IJ SUSP
INTRAMUSCULAR | Status: AC
  Filled 2022-04-30: qty 1

## 2022-04-30 MED ORDER — IOHEXOL 180 MG/ML  SOLN
INTRAMUSCULAR | Status: AC
  Filled 2022-04-30: qty 20

## 2022-04-30 MED ORDER — LIDOCAINE HCL 2 % IJ SOLN
20.0000 mL | Freq: Once | INTRAMUSCULAR | Status: AC
  Administered 2022-04-30: 100 mg

## 2022-04-30 MED ORDER — TRIAMCINOLONE ACETONIDE 40 MG/ML IJ SUSP
40.0000 mg | Freq: Once | INTRAMUSCULAR | Status: AC
  Administered 2022-04-30: 40 mg

## 2022-04-30 MED ORDER — SODIUM CHLORIDE 0.9% FLUSH
2.0000 mL | Freq: Once | INTRAVENOUS | Status: AC
  Administered 2022-04-30: 2 mL

## 2022-04-30 MED ORDER — PENTAFLUOROPROP-TETRAFLUOROETH EX AERO
INHALATION_SPRAY | Freq: Once | CUTANEOUS | Status: AC
  Administered 2022-04-30: 30 via TOPICAL
  Filled 2022-04-30: qty 116

## 2022-04-30 MED ORDER — ROPIVACAINE HCL 2 MG/ML IJ SOLN
INTRAMUSCULAR | Status: AC
  Filled 2022-04-30: qty 20

## 2022-04-30 MED ORDER — SODIUM CHLORIDE (PF) 0.9 % IJ SOLN
INTRAMUSCULAR | Status: AC
  Filled 2022-04-30: qty 10

## 2022-04-30 MED ORDER — IOHEXOL 180 MG/ML  SOLN
10.0000 mL | Freq: Once | INTRAMUSCULAR | Status: AC
  Administered 2022-04-30: 10 mL via EPIDURAL

## 2022-04-30 MED ORDER — ROPIVACAINE HCL 2 MG/ML IJ SOLN
2.0000 mL | Freq: Once | INTRAMUSCULAR | Status: AC
  Administered 2022-04-30: 2 mL via EPIDURAL

## 2022-04-30 NOTE — Progress Notes (Signed)
Safety precautions to be maintained throughout the outpatient stay will include: orient to surroundings, keep bed in low position, maintain call bell within reach at all times, provide assistance with transfer out of bed and ambulation.  

## 2022-04-30 NOTE — Patient Instructions (Addendum)
____________________________________________________________________________________________  Virtual Visits   What is a "Virtual Visit"? It is a Metallurgist (medical visit) that takes place on real time (NOT TEXT or E-MAIL) over the telephone or computer device (desktop, laptop, tablet, smart phone, etc.). It allows for more location flexibility between the patient and the healthcare provider.  Who decides when these types of visits will be used? The physician.  Who is eligible for these types of visits? Only those patients that can be reliably reached over the telephone.  What do you mean by reliably? We do not have time to call everyone multiple times, therefore those that tend to screen calls and then call back later are not suitable candidates for this system. We understand how people are reluctant to pickup on "unknown" calls, therefore, we suggest adding our telephone numbers to your list of "CONTACT(s)". This way, you should be able to readily identify our calls when you receive one. All of our numbers are available below.   Who is not eligible? This option is not available for medication management encounters, specially for controlled substances. Patients on pain medications that fall under the category of controlled substances have to come in for "Face-to-Face" encounters. This is required for mandatory monitoring of these substances. You may be asked to provide a sample for an unannounced urine drug screening test (UDS), and we will need to count your pain pills. Not bringing your pills to be counted may result in no refill. Obviously, neither one of these can be done over the phone.  When will this type of visits be used? You can request a virtual visit whenever you are physically unable to attend a regular appointment. The decision will be made by the physician (or healthcare provider) on a case by case basis.   At what time will I be called? This is an  excellent question. The providers will try to call you whenever they have time available. Do not expect to be called at any specific time. The secretaries will assign you a time for your virtual visit appointment, but this is done simply to keep a list of those patients that need to be called, but not for the purpose of keeping a time schedule. Be advised that the call may come in anytime during the day, between the hours of 8:00 AM and 8::00 PM, depending on provider availability. We do understand that the system is not perfect. If you are unable to be available that day on a moments notice, then request an "in-person" appointment rather than a "virtual visit".  Can I request my medication visits to be "Virtual"? Yes you may request it, but the decision is entirely up to the healthcare provider. Control substances require specific monitoring that requires Face-to-Face encounters. The number of encounters  and the extent of the monitoring is determined on a case by case basis.  Add a new contact to your smart phone and label it "PAIN CLINIC" Under this contact add the following numbers: Main: [phone] (Official Contact Number) Nurses: [phone] (These are outgoing only calling systems. Do not call this number.) Dr. Dossie Arbour: [phone] or [phone] (Outgoing calls only. Do not call this number.)  ____________________________________________________________________________________________  ____________________________________________________________________________________________  Post-Procedure Discharge Instructions  Instructions: Apply ice:  Purpose: This will minimize any swelling and discomfort after procedure.  When: Day of procedure, as soon as you get home. How: Fill a plastic sandwich bag with crushed ice. Cover it with a small towel and apply to injection  site. How long: (15 min on, 15 min off) Apply for 15 minutes then remove x 15 minutes.  Repeat sequence on day of  procedure, until you go to bed. Apply heat:  Purpose: To treat any soreness and discomfort from the procedure. When: Starting the next day after the procedure. How: Apply heat to procedure site starting the day following the procedure. How long: May continue to repeat daily, until discomfort goes away. Food intake: Start with clear liquids (like water) and advance to regular food, as tolerated.  Physical activities: Keep activities to a minimum for the first 8 hours after the procedure. After that, then as tolerated. Driving: If you have received any sedation, be responsible and do not drive. You are not allowed to drive for 24 hours after having sedation. Blood thinner: (Applies only to those taking blood thinners) You may restart your blood thinner 6 hours after your procedure. Insulin: (Applies only to Diabetic patients taking insulin) As soon as you can eat, you may resume your normal dosing schedule. Infection prevention: Keep procedure site clean and dry. Shower daily and clean area with soap and water. Post-procedure Pain Diary: Extremely important that this be done correctly and accurately. Recorded information will be used to determine the next step in treatment. For the purpose of accuracy, follow these rules: Evaluate only the area treated. Do not report or include pain from an untreated area. For the purpose of this evaluation, ignore all other areas of pain, except for the treated area. After your procedure, avoid taking a long nap and attempting to complete the pain diary after you wake up. Instead, set your alarm clock to go off every hour, on the hour, for the initial 8 hours after the procedure. Document the duration of the numbing medicine, and the relief you are getting from it. Do not go to sleep and attempt to complete it later. It will not be accurate. If you received sedation, it is likely that you were given a medication that may cause amnesia. Because of this, completing the  diary at a later time may cause the information to be inaccurate. This information is needed to plan your care. Follow-up appointment: Keep your post-procedure follow-up evaluation appointment after the procedure (usually 2 weeks for most procedures, 6 weeks for radiofrequencies). DO NOT FORGET to bring you pain diary with you.   Expect: (What should I expect to see with my procedure?) From numbing medicine (AKA: Local Anesthetics): Numbness or decrease in pain. You may also experience some weakness, which if present, could last for the duration of the local anesthetic. Onset: Full effect within 15 minutes of injected. Duration: It will depend on the type of local anesthetic used. On the average, 1 to 8 hours.  From steroids (Applies only if steroids were used): Decrease in swelling or inflammation. Once inflammation is improved, relief of the pain will follow. Onset of benefits: Depends on the amount of swelling present. The more swelling, the longer it will take for the benefits to be seen. In some cases, up to 10 days. Duration: Steroids will stay in the system x 2 weeks. Duration of benefits will depend on multiple posibilities including persistent irritating factors. Side-effects: If present, they may typically last 2 weeks (the duration of the steroids). Frequent: Cramps (if they occur, drink Gatorade and take over-the-counter Magnesium 450-500 mg once to twice a day); water retention with temporary weight gain; increases in blood sugar; decreased immune system response; increased appetite. Occasional: Facial flushing (red, warm  cheeks); mood swings; menstrual changes. Uncommon: Long-term decrease or suppression of natural hormones; bone thinning. (These are more common with higher doses or more frequent use. This is why we prefer that our patients avoid having any injection therapies in other practices.)  Very Rare: Severe mood changes; psychosis; aseptic necrosis. From procedure: Some  discomfort is to be expected once the numbing medicine wears off. This should be minimal if ice and heat are applied as instructed.  Call if: (When should I call?) You experience numbness and weakness that gets worse with time, as opposed to wearing off. New onset bowel or bladder incontinence. (Applies only to procedures done in the spine)  Emergency Numbers: Durning business hours (Monday - Thursday, 8:00 AM - 4:00 PM) (Friday, 9:00 AM - 12:00 Noon): [phone] After hours: [phone] NOTE: If you are having a problem and are unable connect with, or to talk to a provider, then go to your nearest urgent care or emergency department. If the problem is serious and urgent, please call 911. ____________________________________________________________________________________________  Pain Management Discharge Instructions  General Discharge Instructions :  If you need to reach your doctor call: Monday-Friday 8:00 am - 4:00 pm at [phone] or toll free [phone].  After clinic hours [phone] to have operator reach doctor.  Bring all of your medication bottles to all your appointments in the pain clinic.  To cancel or reschedule your appointment with Pain Management please remember to call 24 hours in advance to avoid a fee.  Refer to the educational materials which you have been given on: General Risks, I had my Procedure. Discharge Instructions, Post Sedation.  Post Procedure Instructions:  The drugs you were given will stay in your system until tomorrow, so for the next 24 hours you should not drive, make any legal decisions or drink any alcoholic beverages.  You may eat anything you prefer, but it is better to start with liquids then soups and crackers, and gradually work up to solid foods.  Please notify your doctor immediately if you have any unusual bleeding, trouble breathing or pain that is not related to your normal pain.  Depending on the type of procedure that  was done, some parts of your body may feel week and/or numb.  This usually clears up by tonight or the next day.  Walk with the use of an assistive device or accompanied by an adult for the 24 hours.  You may use ice on the affected area for the first 24 hours.  Put ice in a Ziploc bag and cover with a towel and place against area 15 minutes on 15 minutes off.  You may switch to heat after 24 hours.Epidural Steroid Injection Patient Information  Description: The epidural space surrounds the nerves as they exit the spinal cord.  In some patients, the nerves can be compressed and inflamed by a bulging disc or a tight spinal canal (spinal stenosis).  By injecting steroids into the epidural space, we can bring irritated nerves into direct contact with a potentially helpful medication.  These steroids act directly on the irritated nerves and can reduce swelling and inflammation which often leads to decreased pain.  Epidural steroids may be injected anywhere along the spine and from the neck to the low back depending upon the location of your pain.   After numbing the skin with local anesthetic (like Novocaine), a small needle is passed into the epidural space slowly.  You may experience a sensation of pressure while this is being  done.  The entire block usually last less than 10 minutes.  Conditions which may be treated by epidural steroids:  Low back and leg pain Neck and arm pain Spinal stenosis Post-laminectomy syndrome Herpes zoster (shingles) pain Pain from compression fractures  Preparation for the injection:  Do not eat any solid food or dairy products within 8 hours of your appointment.  You may drink clear liquids up to 3 hours before appointment.  Clear liquids include water, black coffee, juice or soda.  No milk or cream please. You may take your regular medication, including pain medications, with a sip of water before your appointment  Diabetics should hold regular insulin (if taken  separately) and take 1/2 normal NPH dos the morning of the procedure.  Carry some sugar containing items with you to your appointment. A driver must accompany you and be prepared to drive you home after your procedure.  Bring all your current medications with your. An IV may be inserted and sedation may be given at the discretion of the physician.   A blood pressure cuff, EKG and other monitors will often be applied during the procedure.  Some patients may need to have extra oxygen administered for a short period. You will be asked to provide medical information, including your allergies, prior to the procedure.  We must know immediately if you are taking blood thinners (like Coumadin/Warfarin)  Or if you are allergic to IV iodine contrast (dye). We must know if you could possible be pregnant.  Possible side-effects: Bleeding from needle site Infection (rare, may require surgery) Nerve injury (rare) Numbness & tingling (temporary) Difficulty urinating (rare, temporary) Spinal headache ( a headache worse with upright posture) Light -headedness (temporary) Pain at injection site (several days) Decreased blood pressure (temporary) Weakness in arm/leg (temporary) Pressure sensation in back/neck (temporary)  Call if you experience: Fever/chills associated with headache or increased back/neck pain. Headache worsened by an upright position. New onset weakness or numbness of an extremity below the injection site Hives or difficulty breathing (go to the emergency room) Inflammation or drainage at the infection site Severe back/neck pain Any new symptoms which are concerning to you  Please note:  Although the local anesthetic injected can often make your back or neck feel good for several hours after the injection, the pain will likely return.  It takes 3-7 days for steroids to work in the epidural space.  You may not notice any pain relief for at least that one week.  If effective, we will  often do a series of three injections spaced 3-6 weeks apart to maximally decrease your pain.  After the initial series, we generally will wait several months before considering a repeat injection of the same type.  If you have any questions, please call [phone] [facility]

## 2022-04-30 NOTE — Progress Notes (Signed)
PROVIDER NOTE: Interpretation of information contained herein should be left to medically-trained personnel. Specific patient instructions are provided elsewhere under "Patient Instructions" section of medical record. This document was created in part using STT-dictation technology, any transcriptional errors that may result from this process are unintentional.  Patient: Brittney Simpson Type: Established DOB: March 07, 1928 MRN: [id number] PCP: Olin Hauser, DO  Service: Procedure DOS: 04/30/2022 Setting: Ambulatory Location: Ambulatory outpatient facility Delivery: Face-to-face Provider: Gaspar Cola, MD Specialty: Interventional Pain Management Specialty designation: 09 Location: Outpatient facility Ref. Prov.: Nobie Putnam *    Primary Reason for Visit: Interventional Pain Management Treatment. CC: Back Pain (low)   Procedure:           Type: Lumbar epidural steroid injection (LESI) (interlaminar) #7    Laterality: Right   Level:  T12-L1 Level.  Imaging: Fluoroscopic guidance         Anesthesia: Local anesthesia (1-2% Lidocaine) Anxiolysis: None                 Sedation: No Sedation                       DOS: 04/30/2022  Performed by: Gaspar Cola, MD  Purpose: Diagnostic/Therapeutic Indications: Lumbar radicular pain of intraspinal etiology of more than 4 weeks that has failed to respond to conservative therapy and is severe enough to impact quality of life or function. 1. DDD (degenerative disc disease), thoracolumbar   2. T12 compression fracture, sequela   3. Spondylolisthesis of lumbosacral region (L2-3 and L5-S1)   4. Spinal stenosis of lumbar region without neurogenic claudication   5. Chronic lumbar radicular pain (Right)   6. Chronic low back pain (1ry area of Pain) (Bilateral) (R>L) w/o sciatica   7. Acute exacerbation of chronic low back pain    NAS-11 Pain score:   Pre-procedure: 9 /10   Post-procedure: 6 /10      Position /  Prep / Materials:  Position: Prone w/ head of the table raised (slight reverse trendelenburg) to facilitate breathing.  Prep solution: DuraPrep (Iodine Povacrylex [0.7% available iodine] and Isopropyl Alcohol, 74% w/w) Prep Area: Entire Posterior Lumbar Region from lower scapular tip down to mid buttocks area and from flank to flank. Materials:  Tray: Epidural tray Needle(s):  Type: Epidural needle (Tuohy) Gauge (G):  17 Length: Regular (3.5-in) Qty: 1  Pre-op H&P Assessment:  Brittney Simpson is a 88 y.o. (year old), female patient, seen today for interventional treatment. She  has a past surgical history that includes Hernia repair; Hernia repair; Hemorrhoid surgery; Kyphoplasty (N/A, 01/23/2017); Kyphoplasty (N/A, 01/25/2020); and Cholecystectomy. Brittney Simpson has a current medication list which includes the following prescription(s): acetaminophen, amlodipine, brimonidine, vitamin d3, clonazepam, cranberry, furosemide, latanoprost, levothyroxine, lisinopril, melatonin, metoprolol succinate, omeprazole, tamsulosin, tramadol, trazodone, amoxicillin, methocarbamol, and sulfamethoxazole-trimethoprim. Her primarily concern today is the Back Pain (low)  Initial Vital Signs:  Pulse/HCG Rate: (!) 55ECG Heart Rate: 62 Temp: (!) 97.2 F (36.2 C) Resp: 16 BP: (!) 154/73 SpO2: 97 %  BMI: Estimated body mass index is 20.6 kg/m as calculated from the following:   Height as of this encounter: '5\' 4"'$  (1.626 m).   Weight as of this encounter: 120 lb (54.4 kg).  Risk Assessment: Allergies: Reviewed. She has No Known Allergies.  Allergy Precautions: None required Coagulopathies: Reviewed. None identified.  Blood-thinner therapy: None at this time Active Infection(s): Reviewed. None identified. Brittney Simpson is afebrile  Site Confirmation: Brittney Simpson was asked to confirm  the procedure and laterality before marking the site Procedure checklist: Completed Consent: Before the procedure and under the  influence of no sedative(s), amnesic(s), or anxiolytics, the patient was informed of the treatment options, risks and possible complications. To fulfill our ethical and legal obligations, as recommended by the American Medical Association's Code of Ethics, I have informed the patient of my clinical impression; the nature and purpose of the treatment or procedure; the risks, benefits, and possible complications of the intervention; the alternatives, including doing nothing; the risk(s) and benefit(s) of the alternative treatment(s) or procedure(s); and the risk(s) and benefit(s) of doing nothing. The patient was provided information about the general risks and possible complications associated with the procedure. These may include, but are not limited to: failure to achieve desired goals, infection, bleeding, organ or nerve damage, allergic reactions, paralysis, and death. In addition, the patient was informed of those risks and complications associated to Spine-related procedures, such as failure to decrease pain; infection (i.e.: Meningitis, epidural or intraspinal abscess); bleeding (i.e.: epidural hematoma, subarachnoid hemorrhage, or any other type of intraspinal or peri-dural bleeding); organ or nerve damage (i.e.: Any type of peripheral nerve, nerve root, or spinal cord injury) with subsequent damage to sensory, motor, and/or autonomic systems, resulting in permanent pain, numbness, and/or weakness of one or several areas of the body; allergic reactions; (i.e.: anaphylactic reaction); and/or death. Furthermore, the patient was informed of those risks and complications associated with the medications. These include, but are not limited to: allergic reactions (i.e.: anaphylactic or anaphylactoid reaction(s)); adrenal axis suppression; blood sugar elevation that in diabetics may result in ketoacidosis or comma; water retention that in patients with history of congestive heart failure may result in shortness of  breath, pulmonary edema, and decompensation with resultant heart failure; weight gain; swelling or edema; medication-induced neural toxicity; particulate matter embolism and blood vessel occlusion with resultant organ, and/or nervous system infarction; and/or aseptic necrosis of one or more joints. Finally, the patient was informed that Medicine is not an exact science; therefore, there is also the possibility of unforeseen or unpredictable risks and/or possible complications that may result in a catastrophic outcome. The patient indicated having understood very clearly. We have given the patient no guarantees and we have made no promises. Enough time was given to the patient to ask questions, all of which were answered to the patient's satisfaction. Ms. Rochon has indicated that she wanted to continue with the procedure. Attestation: I, the ordering provider, attest that I have discussed with the patient the benefits, risks, side-effects, alternatives, likelihood of achieving goals, and potential problems during recovery for the procedure that I have provided informed consent. Date  Time: 04/30/2022 10:34 AM  Pre-Procedure Preparation:  Monitoring: As per clinic protocol. Respiration, ETCO2, SpO2, BP, heart rate and rhythm monitor placed and checked for adequate function Safety Precautions: Patient was assessed for positional comfort and pressure points before starting the procedure. Time-out: I initiated and conducted the "Time-out" before starting the procedure, as per protocol. The patient was asked to participate by confirming the accuracy of the "Time Out" information. Verification of the correct person, site, and procedure were performed and confirmed by me, the nursing staff, and the patient. "Time-out" conducted as per Joint Commission's Universal Protocol (UP.01.01.01). Time: 1050  Description/Narrative of Procedure:          Target: Epidural space via interlaminar opening, initially  targeting the lower laminar border of the superior vertebral body. Region: Lumbar Approach: Percutaneous paravertebral  Rationale (medical necessity): procedure  needed and proper for the diagnosis and/or treatment of the patient's medical symptoms and needs. Procedural Technique Safety Precautions: Aspiration looking for blood return was conducted prior to all injections. At no point did we inject any substances, as a needle was being advanced. No attempts were made at seeking any paresthesias. Safe injection practices and needle disposal techniques used. Medications properly checked for expiration dates. SDV (single dose vial) medications used. Description of the Procedure: Protocol guidelines were followed. The procedure needle was introduced through the skin, ipsilateral to the reported pain, and advanced to the target area. Bone was contacted and the needle walked caudad, until the lamina was cleared. The epidural space was identified using "loss-of-resistance technique" with 2-3 ml of PF-NaCl (0.9% NSS), in a 5cc LOR glass syringe.  Vitals:   04/30/22 1033 04/30/22 1034 04/30/22 1050 04/30/22 1055  BP:  (!) 154/73 (!) 175/75 (!) 179/75  Pulse:  (!) 55    Resp:  '16 18 16  '$ Temp:  (!) 97.2 F (36.2 C)    SpO2:  97% 97% 97%  Weight: 120 lb (54.4 kg)     Height: '5\' 4"'$  (1.626 m)       Start Time: 1050 hrs. End Time: 1055 hrs.  Imaging Guidance (Spinal):          Type of Imaging Technique: Fluoroscopy Guidance (Spinal) Indication(s): Assistance in needle guidance and placement for procedures requiring needle placement in or near specific anatomical locations not easily accessible without such assistance. Exposure Time: Please see nurses notes. Contrast: Before injecting any contrast, we confirmed that the patient did not have an allergy to iodine, shellfish, or radiological contrast. Once satisfactory needle placement was completed at the desired level, radiological contrast was injected.  Contrast injected under live fluoroscopy. No contrast complications. See chart for type and volume of contrast used. Fluoroscopic Guidance: I was personally present during the use of fluoroscopy. "Tunnel Vision Technique" used to obtain the best possible view of the target area. Parallax error corrected before commencing the procedure. "Direction-depth-direction" technique used to introduce the needle under continuous pulsed fluoroscopy. Once target was reached, antero-posterior, oblique, and lateral fluoroscopic projection used confirm needle placement in all planes. Images permanently stored in EMR. Interpretation: I personally interpreted the imaging intraoperatively. Adequate needle placement confirmed in multiple planes. Appropriate spread of contrast into desired area was observed. No evidence of afferent or efferent intravascular uptake. No intrathecal or subarachnoid spread observed. Permanent images saved into the patient's record.  Antibiotic Prophylaxis:   Anti-infectives (From admission, onward)    None      Indication(s): None identified  Post-operative Assessment:  Post-procedure Vital Signs:  Pulse/HCG Rate: (!) 5560 Temp: (!) 97.2 F (36.2 C) Resp: 16 BP: (!) 179/75 SpO2: 97 %  EBL: None  Complications: No immediate post-treatment complications observed by team, or reported by patient.  Note: The patient tolerated the entire procedure well. A repeat set of vitals were taken after the procedure and the patient was kept under observation following institutional policy, for this type of procedure. Post-procedural neurological assessment was performed, showing return to baseline, prior to discharge. The patient was provided with post-procedure discharge instructions, including a section on how to identify potential problems. Should any problems arise concerning this procedure, the patient was given instructions to immediately contact us, at any time, without hesitation. In any  case, we plan to contact the patient by telephone for a follow-up status report regarding this interventional procedure.  Comments:  No additional relevant information.  Plan of Care  Orders:  Orders Placed This Encounter  Procedures   Lumbar Epidural Injection    Scheduling Instructions:     Procedure: Interlaminar LESI T12-L1     Laterality: Right     Sedation: Patient's choice     Timeframe: Today    Order Specific Question:   Where will this procedure be performed?    Answer:   [facility]   DG [facility] C-ARM 1-60 MIN NO REPORT    Intraoperative interpretation by procedural physician at [place].    Standing Status:   Standing    Number of Occurrences:   1    Order Specific Question:   Reason for exam:    Answer:   Assistance in needle guidance and placement for procedures requiring needle placement in or near specific anatomical locations not easily accessible without such assistance.   Informed Consent Details: Physician/Practitioner Attestation; Transcribe to consent form and obtain patient signature    Note: Always confirm laterality of pain with Ms. Sweis, before procedure. Transcribe to consent form and obtain patient signature.    Order Specific Question:   Physician/Practitioner attestation of informed consent for procedure/surgical case    Answer:   I, the physician/practitioner, attest that I have discussed with the patient the benefits, risks, side effects, alternatives, likelihood of achieving goals and potential problems during recovery for the procedure that I have provided informed consent.    Order Specific Question:   Procedure    Answer:   Lumbar epidural steroid injection under fluoroscopic guidance    Order Specific Question:   Physician/Practitioner performing the procedure    Answer:   Parker Wherley A. Dossie Arbour, MD    Order Specific Question:   Indication/Reason    Answer:   Low back and/or lower extremity pain secondary to lumbar  radiculitis   Provide equipment / supplies at bedside    "Epidural Tray" (Disposable  single use) Catheter: NOT required    Standing Status:   Standing    Number of Occurrences:   1    Order Specific Question:   Specify    Answer:   Epidural Tray   Chronic Opioid Analgesic:  Tramadol 50 mg, 1 tab PO q 8 hrs (150 mg/day of tramadol) MME/day: 15 mg/day.   Medications ordered for procedure: Meds ordered this encounter  Medications   iohexol (OMNIPAQUE) 180 MG/ML injection 10 mL    Must be Myelogram-compatible. If not available, you may substitute with a water-soluble, non-ionic, hypoallergenic, myelogram-compatible radiological contrast medium.   lidocaine (XYLOCAINE) 2 % (with pres) injection 400 mg   pentafluoroprop-tetrafluoroeth (GEBAUERS) aerosol   sodium chloride flush (NS) 0.9 % injection 2 mL   ropivacaine (PF) 2 mg/mL (0.2%) (NAROPIN) injection 2 mL   triamcinolone acetonide (KENALOG-40) injection 40 mg   Medications administered: We administered iohexol, lidocaine, pentafluoroprop-tetrafluoroeth, sodium chloride flush, ropivacaine (PF) 2 mg/mL (0.2%), and triamcinolone acetonide.  See the medical record for exact dosing, route, and time of administration.  Follow-up plan:   Return in about 2 weeks (around 05/14/2022) for Proc-day (T,Th), (VV), (PPE).       Interventional Therapies  Risk  Complexity Considerations:   Estimated body mass index is 20.19 kg/m as calculated from the following:   Height as of this encounter: '5\' 3"'$  (1.6 m).   Weight as of this encounter: 114 lb (51.7 kg). Note: Hard of hearing   Planned  Pending:      Under consideration:   Diagnostic right hip joint  injection  Diagnostic right trochanteric bursa injection  Diagnostic midline caudal ESI (for the tailbone pain)   Completed:   Palliative right T12-L1 LESI x6 (01/22/2022) (100/100/80/80)  Therapeutic right L2-3 LESI x1 (03/07/2020) (100/100/25/<50)  Diagnostic/therapeutic bilateral SI  joint injection x1 (02/10/2020) (100/100/100 x2 days/0)  Palliative right lumbar facet MBB x13 (01/04/2020) (8 to 0) (100/100/100)  Palliative left lumbar facet MBB x11 (01/04/2020) (8 to 0) (100/100/100)    Therapeutic  Palliative (PRN) options:   Palliative T12-L1 LESI     Recent Visits Date Type Provider Dept  04/10/22 Office Visit Milinda Pointer, MD [facility]  02/05/22 Office Visit Milinda Pointer, MD [facility]  Showing recent visits within past 90 days and meeting all other requirements Today's Visits Date Type Provider Dept  04/30/22 Procedure visit Milinda Pointer, MD [facility]  Showing today's visits and meeting all other requirements Future Appointments Date Type Provider Dept  05/16/22 Appointment Milinda Pointer, MD [facility]  Showing future appointments within next 90 days and meeting all other requirements  Disposition: Discharge home  Discharge (Date  Time): 04/30/2022; 1105 hrs.   Primary Care Physician: Olin Hauser, DO Location: [facility] Note by: Gaspar Cola, MD Date: 04/30/2022; Time: 11:52 AM  Disclaimer:  Medicine is not an Chief Strategy Officer. The only guarantee in medicine is that nothing is guaranteed. It is important to note that the decision to proceed with this intervention was based on the information collected from the patient. The Data and conclusions were drawn from the patient's questionnaire, the interview, and the physical examination. Because the information was provided in large part by the patient, it cannot be guaranteed that it has not been purposely or unconsciously manipulated. Every effort has been made to obtain as much relevant data as possible for this evaluation. It is important to note that the conclusions that lead to this procedure are derived in large part from the available data. Always take into account that the treatment will also be  dependent on availability of resources and existing treatment guidelines, considered by other Pain Management Practitioners as being common knowledge and practice, at the time of the intervention. For Medico-Legal purposes, it is also important to point out that variation in procedural techniques and pharmacological choices are the acceptable norm. The indications, contraindications, technique, and results of the above procedure should only be interpreted and judged by a Board-Certified Interventional Pain Specialist with extensive familiarity and expertise in the same exact procedure and technique.

## 2022-05-01 ENCOUNTER — Telehealth: Payer: Self-pay | Admitting: *Deleted

## 2022-05-01 NOTE — Telephone Encounter (Signed)
Spoke with patient's daughter, no problems post procedure.

## 2022-05-03 ENCOUNTER — Ambulatory Visit (INDEPENDENT_AMBULATORY_CARE_PROVIDER_SITE_OTHER): Payer: Medicare Other | Admitting: Vascular Surgery

## 2022-05-03 ENCOUNTER — Encounter (INDEPENDENT_AMBULATORY_CARE_PROVIDER_SITE_OTHER): Payer: Self-pay | Admitting: Vascular Surgery

## 2022-05-03 ENCOUNTER — Ambulatory Visit (INDEPENDENT_AMBULATORY_CARE_PROVIDER_SITE_OTHER): Payer: Medicare Other

## 2022-05-03 VITALS — BP 165/79 | HR 69 | Resp 16 | Wt 119.8 lb

## 2022-05-03 DIAGNOSIS — N1831 Chronic kidney disease, stage 3a: Secondary | ICD-10-CM

## 2022-05-03 DIAGNOSIS — I7133 Infrarenal abdominal aortic aneurysm, ruptured: Secondary | ICD-10-CM

## 2022-05-03 DIAGNOSIS — E782 Mixed hyperlipidemia: Secondary | ICD-10-CM | POA: Diagnosis not present

## 2022-05-03 DIAGNOSIS — M545 Low back pain, unspecified: Secondary | ICD-10-CM

## 2022-05-03 DIAGNOSIS — I7143 Infrarenal abdominal aortic aneurysm, without rupture: Secondary | ICD-10-CM

## 2022-05-03 DIAGNOSIS — I10 Essential (primary) hypertension: Secondary | ICD-10-CM

## 2022-05-03 DIAGNOSIS — G8929 Other chronic pain: Secondary | ICD-10-CM | POA: Diagnosis not present

## 2022-05-03 NOTE — Progress Notes (Signed)
MRN : [id number]  Brittney Simpson is a 88 y.o. (1928/06/15) female who presents with chief complaint of  Chief Complaint  Patient presents with   Follow-up    Ultrasound follow up  .  History of Present Illness: Patient returns today in follow up of her abdominal aortic aneurysm.  She is doing well today.  She denies any symptoms or problems.  She denies any aneurysm related symptoms. Specifically, the patient denies new back or abdominal pain, or signs of peripheral embolization. Duplex today shows significant growth of her abdominal aortic aneurysm now measuring 4.2 cm in maximal diameter.   Current Outpatient Medications  Medication Sig Dispense Refill   acetaminophen (TYLENOL) 500 MG tablet Take 1,000 mg by mouth daily as needed for moderate pain or headache.     amLODipine (NORVASC) 5 MG tablet TAKE 1 TABLET BY MOUTH ONCE DAILY WITH LUNCH 90 tablet 0   brimonidine (ALPHAGAN) 0.2 % ophthalmic solution Place 1 drop into the left eye 2 (two) times daily.     Cholecalciferol (VITAMIN D3) 50 MCG (2000 UT) TABS Take 2,000 Units by mouth daily with lunch.     clonazePAM (KLONOPIN) 0.5 MG tablet TAKE 1/2 TABLET BY MOUTH TWICE DAILY 30 tablet 2   Cranberry 500 MG CAPS Take 1,000 mg by mouth daily with supper.     furosemide (LASIX) 20 MG tablet Take 1.5 tablets (30 mg total) by mouth daily. 45 tablet 5   latanoprost (XALATAN) 0.005 % ophthalmic solution Place 1 drop into both eyes at bedtime.      levothyroxine (SYNTHROID) 112 MCG tablet TAKE 1 TABLET BY MOUTH ONCE DAILY ON AN EMPTY STOMACH. WAIT 30 MINUTES BEFORE TAKING OTHER MEDS. 90 tablet 2   lisinopril (ZESTRIL) 40 MG tablet Take 1 tablet (40 mg total) by mouth daily with lunch. 90 tablet 3   Melatonin 10 MG TABS Take 10 mg by mouth at bedtime.     metoprolol succinate (TOPROL-XL) 100 MG 24 hr tablet TAKE 1 TABLET BY MOUTH ONCE DAILY WITH FOOD 90 tablet 3   omeprazole (PRILOSEC) 20 MG capsule TAKE 1 CAPSULE BY MOUTH ONCE DAILY BEFORE  BREAKFAST 90 capsule 0   tamsulosin (FLOMAX) 0.4 MG CAPS capsule Take 1 capsule (0.4 mg total) by mouth daily. 30 capsule 11   traMADol (ULTRAM) 50 MG tablet Take 1 tablet (50 mg total) by mouth 3 (three) times daily. Each refill must last 30 days. 90 tablet 5   traZODone (DESYREL) 50 MG tablet TAKE 1 TABLET BY MOUTH AT BEDTIME 90 tablet 0   amoxicillin (AMOXIL) 250 MG capsule Take 250 mg by mouth 3 (three) times daily. (Patient not taking: Reported on 04/30/2022)     methocarbamol (ROBAXIN) 500 MG tablet Take 1 tablet (500 mg total) by mouth daily as needed for muscle spasms. 30 tablet 0   sulfamethoxazole-trimethoprim (BACTRIM) 400-80 MG tablet Take 1 tablet by mouth every evening. (Patient not taking: Reported on 04/30/2022)     No current facility-administered medications for this visit.    Past Medical History:  Diagnosis Date   Anxiety    Arthritis    Back ache    Hiatal hernia    Hyperlipidemia    Hypertension    Hypothyroid    Osteopenia    Thyroid disease    UTI (urinary tract infection)     Past Surgical History:  Procedure Laterality Date   CHOLECYSTECTOMY     HEMORRHOID SURGERY     HERNIA REPAIR  HERNIA REPAIR     KYPHOPLASTY N/A 01/23/2017   Procedure: [id number];  Surgeon: Hessie Knows, MD;  Location: [facility];  Service: Orthopedics;  Laterality: N/A;   KYPHOPLASTY N/A 01/25/2020   Procedure: L4 KYPHOPLASTY;  Surgeon: Hessie Knows, MD;  Location: [facility];  Service: Orthopedics;  Laterality: N/A;     Social History   Tobacco Use   Smoking status: Never   Smokeless tobacco: Never  Vaping Use   Vaping Use: Never used  Substance Use Topics   Alcohol use: No    Alcohol/week: 0.0 standard drinks of alcohol   Drug use: No      Family History  Problem Relation Age of Onset   Stroke Mother    Cancer Father   No bleeding or clotting disorders  No Known Allergies  REVIEW OF SYSTEMS (Negative unless checked)   Constitutional: '[]'$ Weight loss   '[]'$ Fever  '[]'$ Chills Cardiac: '[]'$ Chest pain   '[]'$ Chest pressure   '[]'$ Palpitations   '[]'$ Shortness of breath when laying flat   '[]'$ Shortness of breath at rest   '[]'$ Shortness of breath with exertion. Vascular:  '[]'$ Pain in legs with walking   '[]'$ Pain in legs at rest   '[]'$ Pain in legs when laying flat   '[]'$ Claudication   '[]'$ Pain in feet when walking  '[]'$ Pain in feet at rest  '[]'$ Pain in feet when laying flat   '[]'$ History of DVT   '[]'$ Phlebitis   '[]'$ Swelling in legs   '[]'$ Varicose veins   '[]'$ Non-healing ulcers Pulmonary:   '[]'$ Uses home oxygen   '[]'$ Productive cough   '[]'$ Hemoptysis   '[]'$ Wheeze  '[]'$ COPD   '[]'$ Asthma Neurologic:  '[]'$ Dizziness  '[]'$ Blackouts   '[]'$ Seizures   '[]'$ History of stroke   '[]'$ History of TIA  '[]'$ Aphasia   '[]'$ Temporary blindness   '[]'$ Dysphagia   '[]'$ Weakness or numbness in arms   '[]'$ Weakness or numbness in legs Musculoskeletal:  '[x]'$ Arthritis   '[]'$ Joint swelling   '[]'$ Joint pain   '[x]'$ Low back pain Hematologic:  '[]'$ Easy bruising  '[]'$ Easy bleeding   '[]'$ Hypercoagulable state   '[]'$ Anemic  '[]'$ Hepatitis Gastrointestinal:  '[]'$ Blood in stool   '[]'$ Vomiting blood  '[]'$ Gastroesophageal reflux/heartburn   '[]'$ Abdominal pain Genitourinary:  '[]'$ Chronic kidney disease   '[]'$ Difficult urination  '[x]'$ Frequent urination  '[]'$ Burning with urination   '[]'$ Hematuria Skin:  '[]'$ Rashes   '[]'$ Ulcers   '[]'$ Wounds Psychological:  '[x]'$ History of anxiety   '[]'$  History of major depression  Physical Examination  BP (!) 165/79 (BP Location: Right Arm)   Pulse 69   Resp 16   Wt 119 lb 12.8 oz (54.3 kg)   BMI 20.56 kg/m  Gen:  WD/WN, NAD. Appears younger than stated age. Head: [place]/AT, No temporalis wasting. Ear/Nose/Throat: Hearing grossly intact, nares w/o erythema or drainage Eyes: Conjunctiva clear. Sclera non-icteric Neck: Supple.  Trachea midline Pulmonary:  Good air movement, no use of accessory muscles.  Cardiac: RRR, no JVD Vascular:  Vessel Right Left  Radial Palpable Palpable                                   Gastrointestinal: soft, non-tender/non-distended. No  guarding/reflex. Increased aortic impulse Musculoskeletal: M/S 5/5 throughout.  No deformity or atrophy. No edema. Neurologic: Sensation grossly intact in extremities.  Symmetrical.  Speech is fluent.  Psychiatric: Judgment intact, Mood & affect appropriate for pt's clinical situation. Dermatologic: No rashes or ulcers noted.  No cellulitis or open wounds.      Labs Recent Results (from the past 2160 hour(s))  [employer] Select  13 (MW), Urine     Status: None   Collection Time: 04/10/22 11:25 AM  Result Value Ref Range   Summary Note     Comment: ==================================================================== [employer] Select 13 (MW) ==================================================================== Specimen Alert Note: Urinary creatinine is low; ability to detect some drugs may be compromised. Interpret results with caution. (Creatinine) ==================================================================== Test                             Result       Flag       Units  Drug Present and Declared for Prescription Verification   7-aminoclonazepam              310          EXPECTED   ng/mg creat    7-aminoclonazepam is an expected metabolite of clonazepam. Source of    clonazepam is a scheduled prescription medication.    Tramadol                       >50000       EXPECTED   ng/mg creat   O-Desmethyltramadol            30510        EXPECTED   ng/mg creat   N-Desmethyltramadol            7260         EXPECTED   ng/mg creat    Source of tramadol is a prescription medication. O-desmethyltramadol    and N-desm ethyltramadol are expected metabolites of tramadol.  ==================================================================== Test                      Result    Flag   Units      Ref Range   Creatinine              10        LL     mg/dL      >=20 ==================================================================== Declared Medications:  The flagging and interpretation on  this report are based on the  following declared medications.  Unexpected results may arise from  inaccuracies in the declared medications.   **Note: The testing scope of this panel includes these medications:   Clonazepam (Klonopin)  Tramadol (Ultram)   **Note: The testing scope of this panel does not include the  following reported medications:   Acetaminophen (Tylenol)  Amlodipine (Norvasc)  Amoxicillin (Amoxil)  Brimonidine (Alphagan)  Cranberry  Eye Drop  Furosemide (Lasix)  Levothyroxine (Synthroid)  Lisinopril (Zestril)  Melatonin  Methocarbamol (Robaxin)  Metoprolol (Toprol)  Omepra zole (Prilosec)  Sulfamethoxazole (Bactrim)  Tamsulosin (Flomax)  Trazodone (Desyrel)  Trimethoprim (Bactrim)  Vitamin D3 ==================================================================== For clinical consultation, please call [phone]. ====================================================================     Radiology DG PAIN CLINIC C-ARM 1-60 MIN NO REPORT  Result Date: 04/30/2022 Fluoro was used, but no Radiologist interpretation will be provided. Please refer to "NOTES" tab for provider progress note.   Assessment/Plan Essential hypertension blood pressure control important in reducing the progression of atherosclerotic disease and aneurysmal degeneration. On appropriate oral medications.     Hyperlipidemia lipid control important in reducing the progression of atherosclerotic disease. Continue statin therapy     Chronic low back pain (Bilateral) (R>L) This not likely from her aneurysm   CKD (chronic kidney disease) stage 3, GFR 30-59 ml/min Sees nephrology.  With significant aneurysmal growth, this now has to be considered for  repair and a CT scan is prudent and worth the risk of contrast.  AAA (abdominal aortic aneurysm) without rupture ([place]) Duplex today shows significant growth of her abdominal aortic aneurysm now measuring 4.2 cm in maximal diameter.  That  would be an 8 mm growth in 6 months which would be a rapid growth and consideration for repair if the ultrasounds are accurate.  At this point, I think it would be prudent to get a CT scan in the next month or so to evaluate this.  Despite her advanced age, she is quite functional and consideration for repair would be given.  We will see her back following the CT scan.    Leotis Pain, MD  05/03/2022 10:13 AM    This note was created with Dragon medical transcription system.  Any errors from dictation are purely unintentional

## 2022-05-03 NOTE — Assessment & Plan Note (Signed)
Duplex today shows significant growth of her abdominal aortic aneurysm now measuring 4.2 cm in maximal diameter.  That would be an 8 mm growth in 6 months which would be a rapid growth and consideration for repair if the ultrasounds are accurate.  At this point, I think it would be prudent to get a CT scan in the next month or so to evaluate this.  Despite her advanced age, she is quite functional and consideration for repair would be given.  We will see her back following the CT scan.

## 2022-05-06 ENCOUNTER — Ambulatory Visit: Payer: Medicare Other | Admitting: Urology

## 2022-05-06 ENCOUNTER — Telehealth (INDEPENDENT_AMBULATORY_CARE_PROVIDER_SITE_OTHER): Payer: Self-pay | Admitting: Vascular Surgery

## 2022-05-06 NOTE — Telephone Encounter (Signed)
Spoke with pt's daughter and gave information regarding no prior auth required for CT scan ordered by Dr. Lucky Cowboy. She will call and schedule the CT and call to [facility] to schedule CT results appt. Nothing further is needed at this time.

## 2022-05-11 NOTE — Progress Notes (Unsigned)
Patient: Brittney Simpson  Service Category: E/M  Provider: Gaspar Cola, MD  DOB: October 22, 1927  DOS: 05/16/2022  Location: Office  MRN: [id number]  Setting: Ambulatory outpatient  Referring Provider: Nobie Putnam *  Type: Established Patient  Specialty: Interventional Pain Management  PCP: Olin Hauser, DO  Location: Remote location  Delivery: TeleHealth     Virtual Encounter - Pain Management PROVIDER NOTE: Information contained herein reflects review and annotations entered in association with encounter. Interpretation of such information and data should be left to medically-trained personnel. Information provided to patient can be located elsewhere in the medical record under "Patient Instructions". Document created using STT-dictation technology, any transcriptional errors that may result from process are unintentional.    Contact & Pharmacy Preferred: [place]: [phone] (home) Mobile: [phone] (mobile) E-mail: [email or web address]  [place], [place] [place] [address] Phone: [phone] Fax: [phone]   Pre-screening  Brittney Simpson offered "in-person" vs "virtual" encounter. She indicated preferring virtual for this encounter.   Reason COVID-19*  Social distancing based on CDC and AMA recommendations.   I contacted [employer] on 05/16/2022 via telephone.      I clearly identified myself as Gaspar Cola, MD. I verified that I was speaking with the correct person using two identifiers (Name: Brittney Simpson, and date of birth: 01-11-1928).  Consent I sought verbal advanced consent from Brittney Simpson for virtual visit interactions. I informed Brittney Simpson of possible security and privacy concerns, risks, and limitations associated with providing "not-in-person" medical evaluation and management services. I also informed Brittney Simpson of the availability of "in-person"  appointments. Finally, I informed her that there would be a charge for the virtual visit and that she could be  personally, fully or partially, financially responsible for it. Brittney Simpson expressed understanding and agreed to proceed.   Historic Elements   Brittney Simpson is a 88 y.o. year old, female patient evaluated today after our last contact on 04/30/2022. Brittney Simpson  has a past medical history of Anxiety, Arthritis, Back ache, Hiatal hernia, Hyperlipidemia, Hypertension, Hypothyroid, Osteopenia, Thyroid disease, and UTI (urinary tract infection). She also  has a past surgical history that includes Hernia repair; Hernia repair; Hemorrhoid surgery; Kyphoplasty (N/A, 01/23/2017); Kyphoplasty (N/A, 01/25/2020); and Cholecystectomy. Ms. Kirn has a current medication list which includes the following prescription(s): acetaminophen, amlodipine, brimonidine, vitamin d3, clonazepam, cranberry, latanoprost, levothyroxine, lisinopril, melatonin, metoprolol succinate, omeprazole, tamsulosin, tramadol, trazodone, furosemide, and methocarbamol. She  reports that she has never smoked. She has never used smokeless tobacco. She reports that she does not drink alcohol and does not use drugs. Ms. Kutter has No Known Allergies.   HPI  Today, she is being contacted for a post-procedure assessment.  Post-procedure evaluation   Type: Lumbar epidural steroid injection (LESI) (interlaminar) #7    Laterality: Right   Level:  T12-L1 Level.  Imaging: Fluoroscopic guidance         Anesthesia: Local anesthesia (1-2% Lidocaine) Anxiolysis: None                 Sedation: No Sedation                       DOS: 04/30/2022  Performed by: Gaspar Cola, MD  Purpose: Diagnostic/Therapeutic Indications: Lumbar radicular pain of intraspinal etiology of more than 4 weeks that has failed to respond to conservative therapy and is severe enough to impact  quality of life or function. 1. DDD (degenerative disc  disease), thoracolumbar   2. T12 compression fracture, sequela   3. Spondylolisthesis of lumbosacral region (L2-3 and L5-S1)   4. Spinal stenosis of lumbar region without neurogenic claudication   5. Chronic lumbar radicular pain (Right)   6. Chronic low back pain (1ry area of Pain) (Bilateral) (R>L) w/o sciatica   7. Acute exacerbation of chronic low back pain    NAS-11 Pain score:   Pre-procedure: 9 /10   Post-procedure: 6 /10     Effectiveness:  Initial hour after procedure: 100 %. Subsequent 4-6 hours post-procedure: 100 %. Analgesia past initial 6 hours: 80 % (ongoing per daughter). Ongoing improvement:  Analgesic: They report an ongoing 80% improvement in the low back pain. Function: Brittney Simpson reports improvement in function ROM: Brittney Simpson reports improvement in ROM  Pharmacotherapy Assessment   Opioid Analgesic: Tramadol 50 mg, 1 tab PO q 8 hrs (150 mg/day of tramadol) MME/day: 15 mg/day.   Monitoring: [place] PMP: PDMP reviewed during this encounter.       Pharmacotherapy: No side-effects or adverse reactions reported. Compliance: No problems identified. Effectiveness: Clinically acceptable. Plan: Refer to "POC". UDS:  Summary  Date Value Ref Range Status  04/10/2022 Note  Final    Comment:    ==================================================================== [employer] Select 13 (MW) ==================================================================== Specimen Alert Note: Urinary creatinine is low; ability to detect some drugs may be compromised. Interpret results with caution. (Creatinine) ==================================================================== Test                             Result       Flag       Units  Drug Present and Declared for Prescription Verification   7-aminoclonazepam              310          EXPECTED   ng/mg creat    7-aminoclonazepam is an expected metabolite of clonazepam. Source of    clonazepam is a scheduled prescription  medication.    Tramadol                       >50000       EXPECTED   ng/mg creat   O-Desmethyltramadol            30510        EXPECTED   ng/mg creat   N-Desmethyltramadol            7260         EXPECTED   ng/mg creat    Source of tramadol is a prescription medication. O-desmethyltramadol    and N-desmethyltramadol are expected metabolites of tramadol.  ==================================================================== Test                      Result    Flag   Units      Ref Range   Creatinine              10        LL     mg/dL      >=20 ==================================================================== Declared Medications:  The flagging and interpretation on this report are based on the  following declared medications.  Unexpected results may arise from  inaccuracies in the declared medications.   **Note: The testing scope of this panel includes these medications:   Clonazepam (Klonopin)  Tramadol (Ultram)   **Note: The testing  scope of this panel does not include the  following reported medications:   Acetaminophen (Tylenol)  Amlodipine (Norvasc)  Amoxicillin (Amoxil)  Brimonidine (Alphagan)  Cranberry  Eye Drop  Furosemide (Lasix)  Levothyroxine (Synthroid)  Lisinopril (Zestril)  Melatonin  Methocarbamol (Robaxin)  Metoprolol (Toprol)  Omeprazole (Prilosec)  Sulfamethoxazole (Bactrim)  Tamsulosin (Flomax)  Trazodone (Desyrel)  Trimethoprim (Bactrim)  Vitamin D3 ==================================================================== For clinical consultation, please call [phone]. ====================================================================    No results found for: "CBDTHCR", "D8THCCBX", "D9THCCBX"   Laboratory Chemistry Profile   Renal Lab Results  Component Value Date   BUN 10 11/05/2021   CREATININE 1.00 32/67/1245   BCR NOT APPLICABLE 80/99/8338   GFRAA 76 05/16/2020   GFRNONAA 66 05/16/2020    Hepatic Lab Results  Component  Value Date   AST 10 03/07/2021   ALT 7 03/07/2021   ALBUMIN 3.6 11/22/2016   ALKPHOS 59 11/22/2016   LIPASE 21 06/14/2016    Electrolytes Lab Results  Component Value Date   NA 133 (L) 11/05/2021   K 4.7 11/05/2021   CL 96 (L) 11/05/2021   CALCIUM 9.0 11/05/2021   MG 2.1 01/14/2014    Bone No results found for: "VD25OH", "VD125OH2TOT", "SN0539JQ7", "HA1937TK2", "25OHVITD1", "25OHVITD2", "25OHVITD3", "TESTOFREE", "TESTOSTERONE"  Inflammation (CRP: Acute Phase) (ESR: Chronic Phase) No results found for: "CRP", "ESRSEDRATE", "LATICACIDVEN"       Note: Above Lab results reviewed.  Imaging  CT Angio Abd/Pel w/ and/or w/o CLINICAL DATA:  88 year old female with history of abdominal aortic aneurysm. Preoperative planning.  EXAM: CT ANGIOGRAPHY ABDOMEN AND PELVIS WITH CONTRAST AND WITHOUT CONTRAST  TECHNIQUE: Multidetector CT imaging of the abdomen and pelvis was performed using the standard protocol during bolus administration of intravenous contrast. Multiplanar reconstructed images and MIPs were obtained and reviewed to evaluate the vascular anatomy.  RADIATION DOSE REDUCTION: This exam was performed according to the departmental dose-optimization program which includes automated exposure control, adjustment of the mA and/or kV according to patient size and/or use of iterative reconstruction technique.  CONTRAST:  45m OMNIPAQUE IOHEXOL 350 MG/ML SOLN  COMPARISON:  03/20/2021  FINDINGS: VASCULAR  Aorta: Diffuse fibrofatty and calcific atherosclerotic changes. Tortuosity of the distal descending thoracic aorta. At the diaphragmatic hiatus the aorta measures up to 33 mm in greatest short axis dimension. At the level of the renal arteries, the aorta measures up to 45 mm in greatest right axis dimension. The infrarenal abdominal aorta is tortuous with fusiform aneurysmal dilation just proximal to the aortic bifurcation measuring up to 38 mm in greatest short axis  dimension.  Celiac: Patent without evidence of aneurysm, dissection, vasculitis or significant stenosis.  SMA: Patent without evidence of aneurysm, dissection, vasculitis or significant stenosis.  Renals: Single bilateral renal arteries are patent without evidence of aneurysm, dissection, vasculitis, fibromuscular dysplasia or significant stenosis.  IMA: Patent without evidence of aneurysm, dissection, vasculitis or significant stenosis.  Inflow: Patent without evidence of aneurysm, dissection, vasculitis or significant stenosis. Coarse atherosclerotic calcifications about the common iliac arteries bilaterally.  Proximal Outflow: Bilateral common femoral and visualized portions of the superficial and profunda femoral arteries are patent without evidence of aneurysm, dissection, vasculitis or significant stenosis.  Veins: No obvious venous abnormality within the limitations of this arterial phase study.  Review of the MIP images confirms the above findings.  NON-VASCULAR  Lower chest: Left dominant biatrial cardiomegaly. No acute abnormality.  Hepatobiliary: No focal liver abnormality is seen. Status post cholecystectomy. No biliary dilatation.  Pancreas: Unremarkable. No pancreatic ductal dilatation or surrounding  inflammatory changes.  Spleen: Normal in size without focal abnormality.  Adrenals/Urinary Tract: Adrenal glands are unremarkable. Multifocal bilateral peripelvic renal cysts and extrarenal pelves. Mild diffuse cortical thinning. Kidneys are otherwise normal, without renal calculi, focal lesion, or hydronephrosis. Bladder is distended without mural thickening. An extrarenal pelves.  Stomach/Bowel: Moderate hiatal hernia. Stomach is otherwise within normal limits. Appendix appears normal. No evidence of bowel wall thickening, distention, or inflammatory changes.  Lymphatic: No abdominopelvic lymphadenopathy.  Reproductive: Uterus and bilateral adnexa are  unremarkable.  Other: No abdominal wall hernia or abnormality. No abdominopelvic ascites.  Musculoskeletal: No acute osseous abnormality. Status post T12 and L4 vertebral body cement augmentation, unchanged from comparison. Moderate multilevel degenerative changes of the visualized thoracolumbar spine with unchanged moderate anterolisthesis of L5 on S1 secondary to bilateral pars interarticularis defects. Diffuse osteopenia.  IMPRESSION: VASCULAR  1. Pararenal fusiform abdominal aortic aneurysm measuring up to 4.5 cm. Recommend follow-up CT/MR every 6 months and vascular consultation if not already obtained. This recommendation follows ACR consensus guidelines: White Paper of the ACR Incidental Findings Committee II on Vascular Findings. J Am Coll Radiol 2013; 10:789-794. 2. Infrarenal fusiform abdominal aortic aneurysm measuring up to 3.8 cm. 3.  Aortic Atherosclerosis (ICD10-I70.0). 4. Left dominant biatrial cardiomegaly.  NON-VASCULAR  1. No acute abdominopelvic abnormality. 2. Moderate hiatal hernia. 3. Bilateral multifocal peripelvic cysts and extrarenal pelves.  Ruthann Cancer, MD  [facility]  [facility]  Electronically Signed   By: Ruthann Cancer M.D.   On: 05/15/2022 12:35  Assessment  The primary encounter diagnosis was DDD (degenerative disc disease), thoracolumbar. Diagnoses of T12 compression fracture, sequela, Spondylolisthesis of lumbosacral region (L2-3 and L5-S1), Spinal stenosis of lumbar region without neurogenic claudication, Chronic lumbar radicular pain (Right), Chronic low back pain (1ry area of Pain) (Bilateral) (R>L) w/o sciatica, and Chronic pain syndrome were also pertinent to this visit.  Plan of Care  Problem-specific:  No problem-specific Assessment & Plan notes found for this encounter.  Brittney Simpson has a current medication list which includes the following long-term medication(s):  amlodipine, clonazepam, levothyroxine, lisinopril, metoprolol succinate, omeprazole, tramadol, trazodone, furosemide, and methocarbamol.  Pharmacotherapy (Medications Ordered): No orders of the defined types were placed in this encounter.  Orders:  Orders Placed This Encounter  Procedures   Lumbar Epidural Injection    Standing Status:   Standing    Number of Occurrences:   1    Standing Expiration Date:   08/16/2022    Scheduling Instructions:     Procedure: Interlaminar Lumbar Epidural Steroid injection (LESI)  T12-L1     Laterality: Right-sided     Sedation: None required.     Timeframe: PRN    Order Specific Question:   Where will this procedure be performed?    Answer:   [facility]   Follow-up plan:   Return if symptoms worsen or fail to improve.     Interventional Therapies  Risk  Complexity Considerations:   Estimated body mass index is 20.19 kg/m as calculated from the following:   Height as of this encounter: _0  (1.6 m).   Weight as of this encounter: 114 lb (51.7 kg). Note: Hard of hearing   Planned  Pending:      Under consideration:   Diagnostic right hip joint injection  Diagnostic right trochanteric bursa injection  Diagnostic midline caudal ESI (for the tailbone pain)   Completed:   Palliative right T12-L1 LESI x6 (01/22/2022) (100/100/80/80)  Therapeutic right L2-3 LESI  x1 (03/07/2020) (100/100/25/<50)  Diagnostic/therapeutic bilateral SI joint injection x1 (02/10/2020) (100/100/100 x2 days/0)  Palliative right lumbar facet MBB x13 (01/04/2020) (8 to 0) (100/100/100)  Palliative left lumbar facet MBB x11 (01/04/2020) (8 to 0) (100/100/100)    Therapeutic  Palliative (PRN) options:   Palliative right T12-L1 LESI (PRN)    Recent Visits Date Type Provider Dept  04/30/22 Procedure visit Milinda Pointer, MD [facility]  04/10/22 Office Visit Milinda Pointer, MD [facility]  Showing recent visits within past 90 days and  meeting all other requirements Today's Visits Date Type Provider Dept  05/16/22 Office Visit Milinda Pointer, MD [facility]  Showing today's visits and meeting all other requirements Future Appointments No visits were found meeting these conditions. Showing future appointments within next 90 days and meeting all other requirements  I discussed the assessment and treatment plan with the patient. The patient was provided an opportunity to ask questions and all were answered. The patient agreed with the plan and demonstrated an understanding of the instructions.  Patient advised to call back or seek an in-person evaluation if the symptoms or condition worsens.  Duration of encounter: 12 minutes.  Note by: Gaspar Cola, MD Date: 05/16/2022; Time: 2:46 PM

## 2022-05-14 ENCOUNTER — Ambulatory Visit
Admission: RE | Admit: 2022-05-14 | Discharge: 2022-05-14 | Disposition: A | Discharge status: Home / Self Care | Payer: Medicare Other | Source: Ambulatory Visit | Attending: Vascular Surgery | Admitting: Vascular Surgery

## 2022-05-14 DIAGNOSIS — I7133 Infrarenal abdominal aortic aneurysm, ruptured: Secondary | ICD-10-CM

## 2022-05-14 LAB — POCT I-STAT CREATININE: Creatinine, Ser: 1 mg/dL (ref 0.44–1.00)

## 2022-05-14 MED ORDER — IOHEXOL 350 MG/ML SOLN
75.0000 mL | Freq: Once | INTRAVENOUS | Status: AC | PRN
  Administered 2022-05-14: 75 mL via INTRAVENOUS

## 2022-05-15 ENCOUNTER — Other Ambulatory Visit (INDEPENDENT_AMBULATORY_CARE_PROVIDER_SITE_OTHER): Payer: Medicare Other

## 2022-05-15 ENCOUNTER — Ambulatory Visit (INDEPENDENT_AMBULATORY_CARE_PROVIDER_SITE_OTHER): Payer: Medicare Other | Admitting: Nurse Practitioner

## 2022-05-15 ENCOUNTER — Other Ambulatory Visit: Payer: Self-pay | Admitting: Family Medicine

## 2022-05-15 DIAGNOSIS — N183 Chronic kidney disease, stage 3 unspecified: Secondary | ICD-10-CM

## 2022-05-15 NOTE — Telephone Encounter (Signed)
Requested Prescriptions  Pending Prescriptions Disp Refills  . furosemide (LASIX) 20 MG tablet [Pharmacy Med Name: FUROSEMIDE 20 MG TAB] 45 tablet 2    Sig: TAKE 1 and 1/2 TABLETS BY MOUTH ONCE DAILY     Cardiovascular:  Diuretics - Loop Failed - 05/15/2022  8:53 AM      Failed - K in normal range and within 180 days    Potassium  Date Value Ref Range Status  11/05/2021 4.7 3.5 - 5.3 mmol/L Final  11/24/2014 4.2 mmol/L Final    Comment:    3.5-5.1 NOTE: New Reference Range  10/04/14          Failed - Ca in normal range and within 180 days    Calcium  Date Value Ref Range Status  11/05/2021 9.0 8.6 - 10.4 mg/dL Final   Calcium, Total  Date Value Ref Range Status  11/24/2014 8.9 mg/dL Final    Comment:    8.9-10.3 NOTE: New Reference Range  10/04/14          Failed - Na in normal range and within 180 days    Sodium  Date Value Ref Range Status  11/05/2021 133 (L) 135 - 146 mmol/L Final  11/24/2014 131 (L) mmol/L Final    Comment:    135-145 NOTE: New Reference Range  10/04/14          Failed - Cl in normal range and within 180 days    Chloride  Date Value Ref Range Status  11/05/2021 96 (L) 98 - 110 mmol/L Final  11/24/2014 97 (L) mmol/L Final    Comment:    101-111 NOTE: New Reference Range  10/04/14          Failed - Mg Level in normal range and within 180 days    Magnesium  Date Value Ref Range Status  01/14/2014 2.1 mg/dL Final    Comment:    1.8-2.4 THERAPEUTIC RANGE: 4-7 mg/dL TOXIC: > 10 mg/dL  -----------------------          Failed - Last BP in normal range    BP Readings from Last 1 Encounters:  05/03/22 (!) 165/79         Passed - Cr in normal range and within 180 days    Creat  Date Value Ref Range Status  11/05/2021 0.90 0.60 - 0.95 mg/dL Final   Creatinine, Ser  Date Value Ref Range Status  05/14/2022 1.00 0.44 - 1.00 mg/dL Final         Passed - Valid encounter within last 6 months    Recent Outpatient Visits           4 months ago RLS (restless legs syndrome)   [facility] Olin Hauser, DO   6 months ago Benign hypertension with CKD (chronic kidney disease) stage III [facility])   [place], DO   1 year ago Benign hypertension with CKD (chronic kidney disease) stage III [facility])   [place], DO   1 year ago RUQ abdominal pain   [place], DO   1 year ago OAB (overactive bladder)   [facility] Olin Hauser, DO      Future Appointments            In 3 weeks MacDiarmid, Nicki Reaper, MD [place]   In 1 month [place], [facility], [facility]

## 2022-05-16 ENCOUNTER — Ambulatory Visit: Payer: Medicare Other | Attending: Pain Medicine | Admitting: Pain Medicine

## 2022-05-16 DIAGNOSIS — M5135 Other intervertebral disc degeneration, thoracolumbar region: Secondary | ICD-10-CM

## 2022-05-16 DIAGNOSIS — M48061 Spinal stenosis, lumbar region without neurogenic claudication: Secondary | ICD-10-CM

## 2022-05-16 DIAGNOSIS — S22080S Wedge compression fracture of T11-T12 vertebra, sequela: Secondary | ICD-10-CM | POA: Diagnosis not present

## 2022-05-16 DIAGNOSIS — M4317 Spondylolisthesis, lumbosacral region: Secondary | ICD-10-CM | POA: Diagnosis not present

## 2022-05-16 DIAGNOSIS — M545 Low back pain, unspecified: Secondary | ICD-10-CM | POA: Diagnosis not present

## 2022-05-16 DIAGNOSIS — M5416 Radiculopathy, lumbar region: Secondary | ICD-10-CM | POA: Diagnosis not present

## 2022-05-16 DIAGNOSIS — G8929 Other chronic pain: Secondary | ICD-10-CM

## 2022-05-16 DIAGNOSIS — G894 Chronic pain syndrome: Secondary | ICD-10-CM

## 2022-05-17 ENCOUNTER — Telehealth: Payer: Self-pay

## 2022-05-20 ENCOUNTER — Telehealth: Payer: Self-pay | Admitting: Urology

## 2022-05-20 MED ORDER — TAMSULOSIN HCL 0.4 MG PO CAPS: 0.4000 mg | ORAL_CAPSULE | Freq: Every day | ORAL | 11 refills | Status: DC

## 2022-05-20 NOTE — Telephone Encounter (Signed)
Spoke with daughter and advised results. rx sent to pharmacy by e-script  

## 2022-05-20 NOTE — Telephone Encounter (Signed)
Pt daughter called, pt only has enough RX of Flomax til this Wednesday.  Please auth RX. Pharmacy is [facility] in [place]. Daughter said that pt didn't fill it earlier because pt did not feel the need to take it on a regular basis.

## 2022-05-21 ENCOUNTER — Other Ambulatory Visit (INDEPENDENT_AMBULATORY_CARE_PROVIDER_SITE_OTHER): Payer: Medicare Other

## 2022-05-21 ENCOUNTER — Ambulatory Visit (INDEPENDENT_AMBULATORY_CARE_PROVIDER_SITE_OTHER): Payer: Medicare Other | Admitting: Vascular Surgery

## 2022-05-22 ENCOUNTER — Other Ambulatory Visit: Payer: Self-pay | Admitting: Family Medicine

## 2022-05-22 DIAGNOSIS — F5101 Primary insomnia: Secondary | ICD-10-CM

## 2022-05-22 DIAGNOSIS — F419 Anxiety disorder, unspecified: Secondary | ICD-10-CM

## 2022-05-22 NOTE — Telephone Encounter (Signed)
Pts daughter called to confirm a request from the pharmacy was sent to office for refills for traZODone (DESYREL) 50 MG tablet and   clonazePAM (KLONOPIN) 0.5 MG tablet   lisinopril (ZESTRIL) 40 MG tablet / please send to [facility]

## 2022-05-23 ENCOUNTER — Telehealth: Payer: Self-pay | Admitting: Family Medicine

## 2022-05-23 NOTE — Telephone Encounter (Signed)
Requested medication (s) are due for refill today:yes  Requested medication (s) are on the active medication list: yes  Last refill:  02/06/22   Future visit scheduled: yes  Notes to clinic:  Unable to refill per protocol, cannot delegate.      Requested Prescriptions  Pending Prescriptions Disp Refills   clonazePAM (KLONOPIN) 0.5 MG tablet [Pharmacy Med Name: CLONAZEPAM 0.5 MG TAB] 30 tablet     Sig: TAKE 1/2 TABLET BY MOUTH TWICE DAILY     Not Delegated - Psychiatry: Anxiolytics/Hypnotics 2 Failed - 05/22/2022  9:22 AM      Failed - This refill cannot be delegated      Failed - Urine Drug Screen completed in last 360 days      Passed - Patient is not pregnant      Passed - Valid encounter within last 6 months    Recent Outpatient Visits           5 months ago RLS (restless legs syndrome)   [facility], Devonne Doughty, DO   6 months ago Benign hypertension with CKD (chronic kidney disease) stage III ([place])   [facility], Devonne Doughty, DO   1 year ago Benign hypertension with CKD (chronic kidney disease) stage III [facility])   [facility] Olin Hauser, DO   1 year ago RUQ abdominal pain   [place], Devonne Doughty, DO   1 year ago OAB (overactive bladder)   [facility], Devonne Doughty, DO       Future Appointments             In 2 weeks MacDiarmid, Nicki Reaper, MD [facility] [place]   In 1 month [place], [facility], [facility]            Signed Prescriptions Disp Refills   traZODone (DESYREL) 50 MG tablet 90 tablet 0    Sig: TAKE 1 TABLET BY MOUTH AT BEDTIME     Psychiatry: Antidepressants - Serotonin Modulator Passed - 05/22/2022  9:22 AM      Passed - Valid encounter within last 6 months    Recent Outpatient Visits           5 months ago RLS (restless legs syndrome)   [place], DO   6 months ago Benign hypertension with CKD (chronic kidney disease) stage III [facility])   [name] Olin Hauser, DO   1 year ago Benign hypertension with CKD (chronic kidney disease) stage III [facility])   [place], DO   1 year ago RUQ abdominal pain   [place], DO   1 year ago OAB (overactive bladder)   [place], Devonne Doughty, DO       Future Appointments             In 2 weeks MacDiarmid, Nicki Reaper, MD [place]   In 1 month [place], Devonne Doughty, [facility], [facility]

## 2022-05-23 NOTE — Telephone Encounter (Signed)
Refill has been sent.  Nobie Putnam, [facility] 05/23/2022, 1:33 PM

## 2022-05-23 NOTE — Telephone Encounter (Signed)
Requested Prescriptions  Pending Prescriptions Disp Refills  . clonazePAM (KLONOPIN) 0.5 MG tablet [Pharmacy Med Name: CLONAZEPAM 0.5 MG TAB] 30 tablet     Sig: TAKE 1/2 TABLET BY MOUTH TWICE DAILY     Not Delegated - Psychiatry: Anxiolytics/Hypnotics 2 Failed - 05/22/2022  9:22 AM      Failed - This refill cannot be delegated      Failed - Urine Drug Screen completed in last 360 days      Passed - Patient is not pregnant      Passed - Valid encounter within last 6 months    Recent Outpatient Visits          5 months ago RLS (restless legs syndrome)   [facility], Devonne Doughty, DO   6 months ago Benign hypertension with CKD (chronic kidney disease) stage III ([place])   [facility] Olin Hauser, DO   1 year ago Benign hypertension with CKD (chronic kidney disease) stage III [facility])   [facility] Olin Hauser, DO   1 year ago RUQ abdominal pain   [place], Devonne Doughty, DO   1 year ago OAB (overactive bladder)   [place], Devonne Doughty, DO      Future Appointments            In 2 weeks MacDiarmid, Nicki Reaper, MD [facility] [place]   In 1 month [place], Devonne Doughty, DO [facility], [place]           . traZODone ([place]) 50 MG tablet [Pharmacy Med Name: TRAZODONE HCL 50 MG TAB] 90 tablet 0    Sig: TAKE 1 TABLET BY MOUTH AT BEDTIME     Psychiatry: Antidepressants - Serotonin Modulator Passed - 05/22/2022  9:22 AM      Passed - Valid encounter within last 6 months    Recent Outpatient Visits          5 months ago RLS (restless legs syndrome)   [place], DO   6 months ago Benign hypertension with CKD (chronic kidney disease) stage III [facility])   [facility] Olin Hauser, DO   1 year ago Benign hypertension with CKD (chronic kidney disease) stage III  [facility])   [place], DO   1 year ago RUQ abdominal pain   [place], DO   1 year ago OAB (overactive bladder)   [place], Devonne Doughty, DO      Future Appointments            In 2 weeks MacDiarmid, Nicki Reaper, MD [place]   In 1 month [place], Devonne Doughty, [facility], [facility]

## 2022-05-23 NOTE — Telephone Encounter (Signed)
Requested medication (s) are due for refill today: yes  Requested medication (s) are on the active medication list: yes  Last refill:  02/06/22  Future visit scheduled: yes  Notes to clinic:  Unable to refill per protocol, cannot delegate.      Requested Prescriptions  Pending Prescriptions Disp Refills   clonazePAM (KLONOPIN) 0.5 MG tablet [Pharmacy Med Name: CLONAZEPAM 0.5 MG TAB] 30 tablet     Sig: TAKE 1/2 TABLET BY MOUTH TWICE DAILY     Not Delegated - Psychiatry: Anxiolytics/Hypnotics 2 Failed - 05/22/2022  9:22 AM      Failed - This refill cannot be delegated      Failed - Urine Drug Screen completed in last 360 days      Passed - Patient is not pregnant      Passed - Valid encounter within last 6 months    Recent Outpatient Visits           5 months ago RLS (restless legs syndrome)   [facility], Devonne Doughty, DO   6 months ago Benign hypertension with CKD (chronic kidney disease) stage III ([place])   [facility], Devonne Doughty, DO   1 year ago Benign hypertension with CKD (chronic kidney disease) stage III [facility])   [facility] Olin Hauser, DO   1 year ago RUQ abdominal pain   [place], Devonne Doughty, DO   1 year ago OAB (overactive bladder)   [place] [place], Devonne Doughty, DO       Future Appointments             In 2 weeks MacDiarmid, Nicki Reaper, MD [facility] [place]   In 1 month [place], [facility], [facility]            Signed Prescriptions Disp Refills   traZODone (DESYREL) 50 MG tablet 90 tablet 0    Sig: TAKE 1 TABLET BY MOUTH AT BEDTIME     Psychiatry: Antidepressants - Serotonin Modulator Passed - 05/22/2022  9:22 AM      Passed - Valid encounter within last 6 months    Recent Outpatient Visits           5 months ago RLS (restless legs syndrome)   [place], DO   6 months ago Benign hypertension with CKD (chronic kidney disease) stage III [facility])   [facility] Olin Hauser, DO   1 year ago Benign hypertension with CKD (chronic kidney disease) stage III [facility])   [place], DO   1 year ago RUQ abdominal pain   [place], DO   1 year ago OAB (overactive bladder)   [place], Devonne Doughty, DO       Future Appointments             In 2 weeks MacDiarmid, Nicki Reaper, MD [place]   In 1 month [place], Devonne Doughty, [facility], [facility]

## 2022-05-23 NOTE — Telephone Encounter (Signed)
Pt is requesting a  refill on  clonazepam

## 2022-05-23 NOTE — Telephone Encounter (Signed)
Caller checking status of refill of clonazePAM (KLONOPIN) 0.5 MG tablet  Advised caller request may take up to 3 bd  Caller requested to send  message to provider stating she is inquiring about refill request

## 2022-05-27 ENCOUNTER — Encounter (INDEPENDENT_AMBULATORY_CARE_PROVIDER_SITE_OTHER): Payer: Self-pay

## 2022-05-28 ENCOUNTER — Ambulatory Visit (INDEPENDENT_AMBULATORY_CARE_PROVIDER_SITE_OTHER): Payer: Medicare Other | Admitting: Vascular Surgery

## 2022-05-28 ENCOUNTER — Encounter (INDEPENDENT_AMBULATORY_CARE_PROVIDER_SITE_OTHER): Payer: Self-pay | Admitting: Vascular Surgery

## 2022-05-28 VITALS — BP 149/69 | HR 60 | Resp 18 | Wt 119.8 lb

## 2022-05-28 DIAGNOSIS — N183 Chronic kidney disease, stage 3 unspecified: Secondary | ICD-10-CM | POA: Diagnosis not present

## 2022-05-28 DIAGNOSIS — E782 Mixed hyperlipidemia: Secondary | ICD-10-CM | POA: Diagnosis not present

## 2022-05-28 DIAGNOSIS — I129 Hypertensive chronic kidney disease with stage 1 through stage 4 chronic kidney disease, or unspecified chronic kidney disease: Secondary | ICD-10-CM | POA: Diagnosis not present

## 2022-05-28 DIAGNOSIS — I7141 Pararenal abdominal aortic aneurysm, without rupture: Secondary | ICD-10-CM | POA: Diagnosis not present

## 2022-05-28 DIAGNOSIS — G8929 Other chronic pain: Secondary | ICD-10-CM | POA: Diagnosis not present

## 2022-05-28 DIAGNOSIS — N1831 Chronic kidney disease, stage 3a: Secondary | ICD-10-CM

## 2022-05-28 DIAGNOSIS — M5416 Radiculopathy, lumbar region: Secondary | ICD-10-CM

## 2022-05-28 NOTE — Assessment & Plan Note (Signed)
I have independently reviewed her CT angiogram of the abdomen pelvis that was performed to evaluate her aneurysm.  All in all, there has not been significant change from her CT scan last year.  She has a pararenal portion of the aneurysm which is stable at 4.5 cm this likely not well seen on duplex.  The infrarenal portion of her aneurysm measured approximately 3.8 cm in maximal diameter. Given the size ranges, we will not likely even consider repair until she reaches closer to 5.5 cm in the perirenal and suprarenal aorta and closer to 5 cm in the infrarenal aorta.  Fortunately, that is not the case.  Given her advanced age continuing to monitor this would be reasonable.  Going to plan on doing uninfused CT scans to monitor this going forward as it is a difficult location to follow-up with duplex

## 2022-05-28 NOTE — Progress Notes (Signed)
MRN : [id number]  Brittney Simpson is a 88 y.o. (02/19/1928) female who presents with chief complaint of No chief complaint on file. Marland Kitchen  History of Present Illness: Patient returns today in follow up of her aneurysm.  She has no new symptoms or problems related to her aneurysm since her last visit.  She is having more urinary retention and is previously seen urologist and this may need to occur again.  She is taking Flomax.  I have independently reviewed her CT angiogram of the abdomen pelvis that was performed to evaluate her aneurysm.  All in all, there has not been significant change from her CT scan last year.  She has a pararenal portion of the aneurysm which is stable at 4.5 cm this likely not well seen on duplex.  The infrarenal portion of her aneurysm measured approximately 3.8 cm in maximal diameter.  Current Outpatient Medications  Medication Sig Dispense Refill   acetaminophen (TYLENOL) 500 MG tablet Take 1,000 mg by mouth daily as needed for moderate pain or headache.     amLODipine (NORVASC) 5 MG tablet TAKE 1 TABLET BY MOUTH ONCE DAILY WITH LUNCH 90 tablet 0   brimonidine (ALPHAGAN) 0.2 % ophthalmic solution Place 1 drop into the left eye 2 (two) times daily.     Cholecalciferol (VITAMIN D3) 50 MCG (2000 UT) TABS Take 2,000 Units by mouth daily with lunch.     clonazePAM (KLONOPIN) 0.5 MG tablet TAKE 1/2 TABLET BY MOUTH TWICE DAILY 30 tablet 2   Cranberry 500 MG CAPS Take 1,000 mg by mouth daily with supper.     furosemide (LASIX) 20 MG tablet TAKE 1 and 1/2 TABLETS BY MOUTH ONCE DAILY 45 tablet 2   latanoprost (XALATAN) 0.005 % ophthalmic solution Place 1 drop into both eyes at bedtime.      levothyroxine (SYNTHROID) 112 MCG tablet TAKE 1 TABLET BY MOUTH ONCE DAILY ON AN EMPTY STOMACH. WAIT 30 MINUTES BEFORE TAKING OTHER MEDS. 90 tablet 2   lisinopril (ZESTRIL) 40 MG tablet Take 1 tablet (40 mg total) by mouth daily with lunch. 90 tablet 3   Melatonin 10 MG TABS Take 10 mg by  mouth at bedtime.     metoprolol succinate (TOPROL-XL) 100 MG 24 hr tablet TAKE 1 TABLET BY MOUTH ONCE DAILY WITH FOOD 90 tablet 3   omeprazole (PRILOSEC) 20 MG capsule TAKE 1 CAPSULE BY MOUTH ONCE DAILY BEFORE BREAKFAST 90 capsule 0   tamsulosin (FLOMAX) 0.4 MG CAPS capsule Take 1 capsule (0.4 mg total) by mouth daily. 30 capsule 11   traMADol (ULTRAM) 50 MG tablet Take 1 tablet (50 mg total) by mouth 3 (three) times daily. Each refill must last 30 days. 90 tablet 5   traZODone (DESYREL) 50 MG tablet TAKE 1 TABLET BY MOUTH AT BEDTIME 90 tablet 0   methocarbamol (ROBAXIN) 500 MG tablet Take 1 tablet (500 mg total) by mouth daily as needed for muscle spasms. 30 tablet 0   No current facility-administered medications for this visit.    Past Medical History:  Diagnosis Date   Anxiety    Arthritis    Back ache    Hiatal hernia    Hyperlipidemia    Hypertension    Hypothyroid    Osteopenia    Thyroid disease    UTI (urinary tract infection)     Past Surgical History:  Procedure Laterality Date   CHOLECYSTECTOMY     HEMORRHOID SURGERY     HERNIA REPAIR  HERNIA REPAIR     KYPHOPLASTY N/A 01/23/2017   Procedure: [id number];  Surgeon: Hessie Knows, MD;  Location: [facility] ORS;  Service: Orthopedics;  Laterality: N/A;   KYPHOPLASTY N/A 01/25/2020   Procedure: L4 KYPHOPLASTY;  Surgeon: Hessie Knows, MD;  Location: [facility] ORS;  Service: Orthopedics;  Laterality: N/A;     Social History   Tobacco Use   Smoking status: Never   Smokeless tobacco: Never  Vaping Use   Vaping Use: Never used  Substance Use Topics   Alcohol use: No    Alcohol/week: 0.0 standard drinks of alcohol   Drug use: No      Family History  Problem Relation Age of Onset   Stroke Mother    Cancer Father      No Known Allergies  REVIEW OF SYSTEMS (Negative unless checked)   Constitutional: '[]'$ Weight loss  '[]'$ Fever  '[]'$ Chills Cardiac: '[]'$ Chest pain   '[]'$ Chest pressure   '[]'$ Palpitations   '[]'$ Shortness of  breath when laying flat   '[]'$ Shortness of breath at rest   '[]'$ Shortness of breath with exertion. Vascular:  '[]'$ Pain in legs with walking   '[]'$ Pain in legs at rest   '[]'$ Pain in legs when laying flat   '[]'$ Claudication   '[]'$ Pain in feet when walking  '[]'$ Pain in feet at rest  '[]'$ Pain in feet when laying flat   '[]'$ History of DVT   '[]'$ Phlebitis   '[]'$ Swelling in legs   '[]'$ Varicose veins   '[]'$ Non-healing ulcers Pulmonary:   '[]'$ Uses home oxygen   '[]'$ Productive cough   '[]'$ Hemoptysis   '[]'$ Wheeze  '[]'$ COPD   '[]'$ Asthma Neurologic:  '[]'$ Dizziness  '[]'$ Blackouts   '[]'$ Seizures   '[]'$ History of stroke   '[]'$ History of TIA  '[]'$ Aphasia   '[]'$ Temporary blindness   '[]'$ Dysphagia   '[]'$ Weakness or numbness in arms   '[]'$ Weakness or numbness in legs Musculoskeletal:  '[x]'$ Arthritis   '[]'$ Joint swelling   '[]'$ Joint pain   '[x]'$ Low back pain Hematologic:  '[]'$ Easy bruising  '[]'$ Easy bleeding   '[]'$ Hypercoagulable state   '[]'$ Anemic  '[]'$ Hepatitis Gastrointestinal:  '[]'$ Blood in stool   '[]'$ Vomiting blood  '[]'$ Gastroesophageal reflux/heartburn   '[]'$ Abdominal pain Genitourinary:  '[]'$ Chronic kidney disease   '[]'$ Difficult urination  '[x]'$ Frequent urination  '[]'$ Burning with urination   '[]'$ Hematuria Skin:  '[]'$ Rashes   '[]'$ Ulcers   '[]'$ Wounds Psychological:  '[x]'$ History of anxiety   '[]'$  History of major depression  Physical Examination  BP (!) 149/69 (BP Location: Right Arm)   Pulse 60   Resp 18   Wt 119 lb 12.8 oz (54.3 kg)   BMI 20.56 kg/m  Gen:  WD/WN, NAD.  Appears younger than stated age Head: [place]/AT, No temporalis wasting. Ear/Nose/Throat: Hearing markedly diminished, nares w/o erythema or drainage Eyes: Conjunctiva clear. Sclera non-icteric Neck: Supple.  Trachea midline Pulmonary:  Good air movement, no use of accessory muscles.  Cardiac: RRR, no JVD Vascular:  Vessel Right Left  Radial Palpable Palpable               Musculoskeletal: M/S 5/5 throughout.  No deformity or atrophy.  No significant lower extremity edema. Neurologic: Sensation grossly intact in extremities.   Symmetrical.  Speech is fluent.  Psychiatric: Judgment intact, Mood & affect appropriate for pt's clinical situation. Dermatologic: No rashes or ulcers noted.  No cellulitis or open wounds.      Labs Recent Results (from the past 2160 hour(s))  ToxASSU[employer] Select 13 (MW), Urine     Status: None   Collection Time: 04/10/22 11:25 AM  Result Value Ref Range   Summary Note  Comment: ==================================================================== ToxASSURE Select 13 (MW) ==================================================================== Specimen Alert Note: Urinary creatinine is low; ability to detect some drugs may be compromised. Interpret results with caution. (Creatinine) ==================================================================== Test                             Result       Flag       Units  Drug Present and Declared for Prescription Verification   7-aminoclonazepam              310          EXPECTED   ng/mg creat    7-aminoclonazepam is an expected metabolite of clonazepam. Source of    clonazepam is a scheduled prescription medication.    Tramadol                       >50000       EXPECTED   ng/mg creat   O-Desmethyltramadol            30510        EXPECTED   ng/mg creat   N-Desmethyltramadol            7260         EXPECTED   ng/mg creat    Source of tramadol is a prescription medication. O-desmethyltramadol    and N-desm ethyltramadol are expected metabolites of tramadol.  ==================================================================== Test                      Result    Flag   Units      Ref Range   Creatinine              10        LL     mg/dL      >=20 ==================================================================== Declared Medications:  The flagging and interpretation on this report are based on the  following declared medications.  Unexpected results may arise from  inaccuracies in the declared medications.   **Note: The testing scope  of this panel includes these medications:   Clonazepam (Klonopin)  Tramadol (Ultram)   **Note: The testing scope of this panel does not include the  following reported medications:   Acetaminophen (Tylenol)  Amlodipine (Norvasc)  Amoxicillin (Amoxil)  Brimonidine (Alphagan)  Cranberry  Eye Drop  Furosemide (Lasix)  Levothyroxine (Synthroid)  Lisinopril (Zestril)  Melatonin  Methocarbamol (Robaxin)  Metoprolol (Toprol)  Omepra zole (Prilosec)  Sulfamethoxazole (Bactrim)  Tamsulosin (Flomax)  Trazodone (Desyrel)  Trimethoprim (Bactrim)  Vitamin D3 ==================================================================== For clinical consultation, please call [phone]. ====================================================================   I-STAT creatinine     Status: None   Collection Time: 05/14/22  8:38 AM  Result Value Ref Range   Creatinine, Ser 1.00 0.44 - 1.00 mg/dL    Radiology CT Angio Abd/Pel w/ and/or w/o  Result Date: 05/15/2022 CLINICAL DATA:  88 year old female with history of abdominal aortic aneurysm. Preoperative planning. EXAM: CT ANGIOGRAPHY ABDOMEN AND PELVIS WITH CONTRAST AND WITHOUT CONTRAST TECHNIQUE: Multidetector CT imaging of the abdomen and pelvis was performed using the standard protocol during bolus administration of intravenous contrast. Multiplanar reconstructed images and MIPs were obtained and reviewed to evaluate the vascular anatomy. RADIATION DOSE REDUCTION: This exam was performed according to the departmental dose-optimization program which includes automated exposure control, adjustment of the mA and/or kV according to patient size and/or use of iterative reconstruction technique. CONTRAST:  52m OMNIPAQUE IOHEXOL 350 MG/ML SOLN  COMPARISON:  03/20/2021 FINDINGS: VASCULAR Aorta: Diffuse fibrofatty and calcific atherosclerotic changes. Tortuosity of the distal descending thoracic aorta. At the diaphragmatic hiatus the aorta measures up to 33  mm in greatest short axis dimension. At the level of the renal arteries, the aorta measures up to 45 mm in greatest right axis dimension. The infrarenal abdominal aorta is tortuous with fusiform aneurysmal dilation just proximal to the aortic bifurcation measuring up to 38 mm in greatest short axis dimension. Celiac: Patent without evidence of aneurysm, dissection, vasculitis or significant stenosis. SMA: Patent without evidence of aneurysm, dissection, vasculitis or significant stenosis. Renals: Single bilateral renal arteries are patent without evidence of aneurysm, dissection, vasculitis, fibromuscular dysplasia or significant stenosis. IMA: Patent without evidence of aneurysm, dissection, vasculitis or significant stenosis. Inflow: Patent without evidence of aneurysm, dissection, vasculitis or significant stenosis. Coarse atherosclerotic calcifications about the common iliac arteries bilaterally. Proximal Outflow: Bilateral common femoral and visualized portions of the superficial and profunda femoral arteries are patent without evidence of aneurysm, dissection, vasculitis or significant stenosis. Veins: No obvious venous abnormality within the limitations of this arterial phase study. Review of the MIP images confirms the above findings. NON-VASCULAR Lower chest: Left dominant biatrial cardiomegaly. No acute abnormality. Hepatobiliary: No focal liver abnormality is seen. Status post cholecystectomy. No biliary dilatation. Pancreas: Unremarkable. No pancreatic ductal dilatation or surrounding inflammatory changes. Spleen: Normal in size without focal abnormality. Adrenals/Urinary Tract: Adrenal glands are unremarkable. Multifocal bilateral peripelvic renal cysts and extrarenal pelves. Mild diffuse cortical thinning. Kidneys are otherwise normal, without renal calculi, focal lesion, or hydronephrosis. Bladder is distended without mural thickening. An extrarenal pelves. Stomach/Bowel: Moderate hiatal hernia.  Stomach is otherwise within normal limits. Appendix appears normal. No evidence of bowel wall thickening, distention, or inflammatory changes. Lymphatic: No abdominopelvic lymphadenopathy. Reproductive: Uterus and bilateral adnexa are unremarkable. Other: No abdominal wall hernia or abnormality. No abdominopelvic ascites. Musculoskeletal: No acute osseous abnormality. Status post T12 and L4 vertebral body cement augmentation, unchanged from comparison. Moderate multilevel degenerative changes of the visualized thoracolumbar spine with unchanged moderate anterolisthesis of L5 on S1 secondary to bilateral pars interarticularis defects. Diffuse osteopenia. IMPRESSION: VASCULAR 1. Pararenal fusiform abdominal aortic aneurysm measuring up to 4.5 cm. Recommend follow-up CT/MR every 6 months and vascular consultation if not already obtained. This recommendation follows ACR consensus guidelines: White Paper of the ACR Incidental Findings Committee II on Vascular Findings. J Am Coll Radiol 2013; 10:789-794. 2. Infrarenal fusiform abdominal aortic aneurysm measuring up to 3.8 cm. 3.  Aortic Atherosclerosis (ICD10-I70.0). 4. Left dominant biatrial cardiomegaly. NON-VASCULAR 1. No acute abdominopelvic abnormality. 2. Moderate hiatal hernia. 3. Bilateral multifocal peripelvic cysts and extrarenal pelves. Ruthann Cancer, MD [facility] Electronically Signed   By: Ruthann Cancer M.D.   On: 05/15/2022 12:35   AAA Duplex  Result Date: 05/06/2022 ABDOMINAL AORTA STUDY Patient Name:  Brittney Simpson  Date of Exam:   05/03/2022 Medical Rec #: [id number]           Accession #:    [id number] Date of Birth: 06-07-1928           Patient Gender: F Patient Age:   63 years Exam Location:  [facility] Procedure:      VAS [place] AAA DUPLEX Referring Phys: Leotis Pain --------------------------------------------------------------------------------  Indications: Follow up exam for  known AAA.  Performing Technologist: Concha [place] RVT  Examination Guidelines: A complete evaluation includes B-mode imaging, spectral Doppler, color Doppler, and power Doppler as needed  of all accessible portions of each vessel. Bilateral testing is considered an integral part of a complete examination. Limited examinations for reoccurring indications may be performed as noted.  Abdominal Aorta Findings: +-----------+-------+----------+----------+--------+--------+--------+ Location   AP (cm)Trans (cm)PSV (cm/s)WaveformThrombusComments +-----------+-------+----------+----------+--------+--------+--------+ Proximal   2.70   2.75      59                                 +-----------+-------+----------+----------+--------+--------+--------+ Mid        4.01   4.18      30                                 +-----------+-------+----------+----------+--------+--------+--------+ Distal     3.14   3.37      34                                 +-----------+-------+----------+----------+--------+--------+--------+ RT CIA Prox1.2    1.3       83                                 +-----------+-------+----------+----------+--------+--------+--------+ LT CIA Prox1.0    1.3       105                                +-----------+-------+----------+----------+--------+--------+--------+  Summary: Abdominal Aorta: There is evidence of abnormal dilatation of the mid and distal Abdominal aorta. Moderately tortuous abdominal aorta with the largest aneurysmal area in the mid abdominal aorta. The distal aorta also shows an increased dilitation just proximal to bifurcation. Previous dilitation of the distal aorta was 2.9cm. The largest aortic diameter has increased compared to prior exam. Previous diameter measurement was 3.4 cm obtained on 10/2021.  *See table(s) above for measurements and observations.  Electronically signed by Leotis Pain MD on 05/06/2022 at 8:40:06 AM.    Final    DG PAIN CLINIC  C-ARM 1-60 MIN NO REPORT  Result Date: 04/30/2022 Fluoro was used, but no Radiologist interpretation will be provided. Please refer to "NOTES" tab for provider progress note.   Assessment/Plan Essential hypertension blood pressure control important in reducing the progression of atherosclerotic disease and aneurysmal degeneration. On appropriate oral medications.     Hyperlipidemia lipid control important in reducing the progression of atherosclerotic disease. Continue statin therapy     Chronic low back pain (Bilateral) (R>L) This not likely from her aneurysm   CKD (chronic kidney disease) stage 3, GFR 30-59 ml/min Sees nephrology.    AAA (abdominal aortic aneurysm) without rupture ([place]) I have independently reviewed her CT angiogram of the abdomen pelvis that was performed to evaluate her aneurysm.  All in all, there has not been significant change from her CT scan last year.  She has a pararenal portion of the aneurysm which is stable at 4.5 cm this likely not well seen on duplex.  The infrarenal portion of her aneurysm measured approximately 3.8 cm in maximal diameter. Given the size ranges, we will not likely even consider repair until she reaches closer to 5.5 cm in the perirenal and suprarenal aorta and closer to 5 cm in the infrarenal aorta.  Fortunately, that is not the case.  Given  her advanced age continuing to monitor this would be reasonable.  Going to plan on doing uninfused CT scans to monitor this going forward as it is a difficult location to follow-up with duplex    Leotis Pain, MD  05/28/2022 3:56 PM    This note was created with Dragon medical transcription system.  Any errors from dictation are purely unintentional

## 2022-06-10 ENCOUNTER — Ambulatory Visit (INDEPENDENT_AMBULATORY_CARE_PROVIDER_SITE_OTHER): Payer: Medicare Other | Admitting: Urology

## 2022-06-10 VITALS — BP 128/73 | HR 62 | Wt 117.0 lb

## 2022-06-10 DIAGNOSIS — R339 Retention of urine, unspecified: Secondary | ICD-10-CM

## 2022-06-10 MED ORDER — TAMSULOSIN HCL 0.4 MG PO CAPS: 0.8000 mg | ORAL_CAPSULE | Freq: Every day | ORAL | 11 refills | Status: DC

## 2022-06-10 NOTE — Progress Notes (Signed)
06/10/2022 10:11 AM   Brittney Simpson 1928-05-03 [id number]  Referring provider: Olin Hauser, DO [address] [place],  [place] [address]  Chief Complaint  Patient presents with   Over Active Bladder   Follow-up    1 year follow up    HPI: Consulted to assess the patient for possible bladder infection and incontinence.  History was limited and patient's daughter was helpful.  Patient is hard of hearing.  It appeared she was wearing 2 depends a day in the last 2 weeks she is not leaking but has trouble to go.  She will feel an urge and then she cannot go.  She voids 4-5 times a day.  She will then double void and strain.  She says she is not getting up at night to urinate   Patient likely having obstructive voiding secondary to overactive bladder.  Her referring doctor wanted to place her on mirabegron which I think is a good choice but apparently it was expensive.  Because of the obstructive component I decided to give her Flomax and perform cystoscopy in the next 3 weeks and proceed accordingly.   Last culture negative Patient clinically not infected today with little bit of burning yesterday.  Urine sent for culture.  Urine looks good She did not want the cystoscopy because the Flomax has dramatically improved her symptoms.  Flow much better and she feels empty   Today Frequency improved with Flomax.  Flow improved.  Little bit of burning today.  Flomax renewed  Today Patient was having trouble to void and family doctor increase it to 0.8 mg of Flomax he thought it worked better.  She has some incontinence but overall is doing well and likes the higher dose.       PMH: Past Medical History:  Diagnosis Date   Anxiety    Arthritis    Back ache    Hiatal hernia    Hyperlipidemia    Hypertension    Hypothyroid    Osteopenia    Thyroid disease    UTI (urinary tract infection)     Surgical History: Past Surgical History:  Procedure Laterality Date    CHOLECYSTECTOMY     HEMORRHOID SURGERY     HERNIA REPAIR     HERNIA REPAIR     KYPHOPLASTY N/A 01/23/2017   Procedure: [id number];  Surgeon: Hessie Knows, MD;  Location: [facility] ORS;  Service: Orthopedics;  Laterality: N/A;   KYPHOPLASTY N/A 01/25/2020   Procedure: L4 KYPHOPLASTY;  Surgeon: Hessie Knows, MD;  Location: [facility] ORS;  Service: Orthopedics;  Laterality: N/A;    Home Medications:  Allergies as of 06/10/2022   No Known Allergies      Medication List        Accurate as of June 10, 2022 10:11 AM. If you have any questions, ask your nurse or doctor.          acetaminophen 500 MG tablet Commonly known as: TYLENOL Take 1,000 mg by mouth daily as needed for moderate pain or headache.   amLODipine 5 MG tablet Commonly known as: NORVASC TAKE 1 TABLET BY MOUTH ONCE DAILY WITH LUNCH   brimonidine 0.2 % ophthalmic solution Commonly known as: ALPHAGAN Place 1 drop into the left eye 2 (two) times daily.   clonazePAM 0.5 MG tablet Commonly known as: KLONOPIN TAKE 1/2 TABLET BY MOUTH TWICE DAILY   Cranberry 500 MG Caps Take 1,000 mg by mouth daily with supper.   furosemide 20 MG tablet Commonly  known as: LASIX TAKE 1 and 1/2 TABLETS BY MOUTH ONCE DAILY   latanoprost 0.005 % ophthalmic solution Commonly known as: XALATAN Place 1 drop into both eyes at bedtime.   levothyroxine 112 MCG tablet Commonly known as: SYNTHROID TAKE 1 TABLET BY MOUTH ONCE DAILY ON AN EMPTY STOMACH. WAIT 30 MINUTES BEFORE TAKING OTHER MEDS.   lisinopril 40 MG tablet Commonly known as: ZESTRIL Take 1 tablet (40 mg total) by mouth daily with lunch.   Melatonin 10 MG Tabs Take 10 mg by mouth at bedtime.   methocarbamol 500 MG tablet Commonly known as: ROBAXIN Take 1 tablet (500 mg total) by mouth daily as needed for muscle spasms.   metoprolol succinate 100 MG 24 hr tablet Commonly known as: TOPROL-XL TAKE 1 TABLET BY MOUTH ONCE DAILY WITH FOOD   omeprazole 20 MG  capsule Commonly known as: PRILOSEC TAKE 1 CAPSULE BY MOUTH ONCE DAILY BEFORE BREAKFAST   tamsulosin 0.4 MG Caps capsule Commonly known as: FLOMAX Take 1 capsule (0.4 mg total) by mouth daily.   traMADol 50 MG tablet Commonly known as: ULTRAM Take 1 tablet (50 mg total) by mouth 3 (three) times daily. Each refill must last 30 days.   traZODone 50 MG tablet Commonly known as: DESYREL TAKE 1 TABLET BY MOUTH AT BEDTIME   Vitamin D3 50 MCG (2000 UT) Tabs Take 2,000 Units by mouth daily with lunch.        Allergies: No Known Allergies  Family History: Family History  Problem Relation Age of Onset   Stroke Mother    Cancer Father     Social History:  reports that she has never smoked. She has never used smokeless tobacco. She reports that she does not drink alcohol and does not use drugs.  ROS:                                        Physical Exam: There were no vitals taken for this visit.  Constitutional:  Alert and oriented, No acute distress. HEENT: [place] AT, moist mucus membranes.  Trachea midline, no masses.  Laboratory Data: Lab Results  Component Value Date   WBC 7.0 11/05/2021   HGB 10.5 (L) 11/05/2021   HCT 35.2 11/05/2021   MCV 79.1 (L) 11/05/2021   PLT 346 11/05/2021    Lab Results  Component Value Date   CREATININE 1.00 05/14/2022    No results found for: "PSA"  No results found for: "TESTOSTERONE"  Lab Results  Component Value Date   HGBA1C 5.2 05/16/2020    Urinalysis    Component Value Date/Time   COLORURINE YELLOW (A) 03/20/2021 1536   APPEARANCEUR Clear 04/30/2021 0945   LABSPEC 1.011 03/20/2021 1536   LABSPEC 1.003 07/08/2014 0907   PHURINE 6.0 03/20/2021 1536   GLUCOSEU Negative 04/30/2021 0945   GLUCOSEU Negative 07/08/2014 0907   HGBUR NEGATIVE 03/20/2021 1536   BILIRUBINUR Negative 04/30/2021 0945   BILIRUBINUR Negative 07/08/2014 [place] 03/20/2021 1536   PROTEINUR Negative  04/30/2021 0945   PROTEINUR NEGATIVE 03/20/2021 1536   NITRITE Negative 04/30/2021 0945   NITRITE NEGATIVE 03/20/2021 1536   LEUKOCYTESUR Negative 04/30/2021 0945   LEUKOCYTESUR NEGATIVE 03/20/2021 1536   LEUKOCYTESUR Negative 07/08/2014 0907    Pertinent Imaging:   Assessment & Plan: Flomax 60 tablets and 0.8 mg a day with 11 refills given.  Watchful waiting for incontinence.  She  was having a lot of flow symptoms a year ago.  I will see her in a year  1. Incomplete bladder emptying  - Urinalysis, Complete   No follow-ups on file.  Reece Packer, MD  [facility] [address], [place], [place] [address] [phone]

## 2022-06-24 ENCOUNTER — Ambulatory Visit (INDEPENDENT_AMBULATORY_CARE_PROVIDER_SITE_OTHER): Payer: Medicare Other | Admitting: Family Medicine

## 2022-06-24 ENCOUNTER — Encounter: Payer: Self-pay | Admitting: Family Medicine

## 2022-06-24 ENCOUNTER — Other Ambulatory Visit: Payer: Self-pay | Admitting: Family Medicine

## 2022-06-24 ENCOUNTER — Ambulatory Visit: Payer: Self-pay | Admitting: *Deleted

## 2022-06-24 VITALS — BP 144/64 | HR 60 | Ht 64.0 in | Wt 121.0 lb

## 2022-06-24 DIAGNOSIS — E034 Atrophy of thyroid (acquired): Secondary | ICD-10-CM | POA: Diagnosis not present

## 2022-06-24 DIAGNOSIS — N183 Chronic kidney disease, stage 3 unspecified: Secondary | ICD-10-CM | POA: Diagnosis not present

## 2022-06-24 DIAGNOSIS — R7309 Other abnormal glucose: Secondary | ICD-10-CM

## 2022-06-24 DIAGNOSIS — G894 Chronic pain syndrome: Secondary | ICD-10-CM

## 2022-06-24 DIAGNOSIS — I129 Hypertensive chronic kidney disease with stage 1 through stage 4 chronic kidney disease, or unspecified chronic kidney disease: Secondary | ICD-10-CM

## 2022-06-24 DIAGNOSIS — Z23 Encounter for immunization: Secondary | ICD-10-CM

## 2022-06-24 DIAGNOSIS — G2581 Restless legs syndrome: Secondary | ICD-10-CM | POA: Diagnosis not present

## 2022-06-24 DIAGNOSIS — H9193 Unspecified hearing loss, bilateral: Secondary | ICD-10-CM | POA: Diagnosis not present

## 2022-06-24 DIAGNOSIS — N3281 Overactive bladder: Secondary | ICD-10-CM | POA: Diagnosis not present

## 2022-06-24 DIAGNOSIS — Z0000 Encounter for general adult medical examination without abnormal findings: Secondary | ICD-10-CM | POA: Diagnosis not present

## 2022-06-24 MED ORDER — METOPROLOL SUCCINATE ER 100 MG PO TB24: 100.0000 mg | ORAL_TABLET | Freq: Every day | ORAL | 3 refills | Status: DC

## 2022-06-24 NOTE — Assessment & Plan Note (Signed)
Recommend Audiology for hearing aid, gave them contact info # and they will call If need referral, let me know.

## 2022-06-24 NOTE — Telephone Encounter (Signed)
Message from Marlis Edelson sent at 06/24/2022  3:25 PM EST  Summary: abx concern / flu shot follow up   [name] would like to discuss how soon the patient can take antibiotics after getting their flu shot  Please contact the patient's daughter further when possible  ----- Message from Marlis Edelson sent at 06/24/2022  3:25 PM EST ----- [name] would like to discuss how soon the patient can take antibiotics after getting their flu shot  Please contact the patient's daughter further when possible          Call History   Type Contact Phone/Fax User  06/24/2022 03:19 PM EST Phone (Incoming) Hoggard,Carol (Emergency Contact) [phone] Tessa Lerner A   Reason for Disposition  Health Information question, no triage required and triager able to answer question  Answer Assessment - Initial Assessment Questions 1. REASON FOR CALL or QUESTION: "What is your reason for calling today?" or "How can I best help you?" or "What question do you have that I can help answer?"     I returned the call to daughter [name].   She wanted to know how soon after her mother getting a flu shot (which she got today) could she take an antibiotic if needed.  I let her know she could take an antibiotic any time after having the flu shot as long as she wasn't sick.  They usually advise you wait to get a flu shot until you are over the illness before taking the shot.    She gets frequent UTIs so they keep antibiotics on stand by for her mother so wanted to be sure it was ok to start the antibiotic if she started having UTI symptoms in the next few days even though she got a flu shot today.    I let her know yes, the antibiotics can be started in that situation.  Protocols used: Information Only Call - No Triage-A-AH

## 2022-06-24 NOTE — Patient Instructions (Addendum)
Thank you for coming to the office today.  Mild elevated BP, improved on repeat check. Okay with 140 / 60  Flu Shot today, high dose  Metoprolol medication refilled.  For hearing aid - please check with this location to see what they recommend on how to proceed  If you need a referral, please let me know.  [facility] Address: [address] [place], [place], [place] [address] Phone: [phone]  DUE for FASTING BLOOD WORK (no food or drink after midnight before the lab appointment, only water or coffee without cream/sugar on the morning of)  SCHEDULE "Lab Only" visit in the morning at the clinic for lab draw on 06/28/22 at 8am  - Make sure Lab Only appointment is at about 1 week before your next appointment, so that results will be available  For Lab Results, once available within 2-3 days of blood draw, you can can log in to MyChart online to view your results and a brief explanation. Also, we can discuss results at next follow-up visit.   Please schedule a Follow-up Appointment to: Return in about 4 days (around 06/28/2022) for Return 4 days Fri 12/1 fasting lab only 8am.  If you have any other questions or concerns, please feel free to call the office or send a message through [place]. You may also schedule an earlier appointment if necessary.  Additionally, you may be receiving a survey about your experience at our office within a few days to 1 week by e-mail or mail. We value your feedback.  Nobie Putnam, DO [place]

## 2022-06-24 NOTE — Assessment & Plan Note (Signed)
Previously controlled Return Friday 12/1 labs Keep current levothyroxine 169mg daily for now

## 2022-06-24 NOTE — Assessment & Plan Note (Signed)
Controlled BP Complicated by CKD-III  Plan: 1. Continue current regimen - Amlodipine '5mg'$  daily, Lisinopril '40mg'$  daily, Metoprolol XL '100mg'$  daily - May continue Lasix '30mg'$  daily now per Nephrology - refilled 2. Encourage to stay active, low sodium diet, improve hydration with water

## 2022-06-24 NOTE — Progress Notes (Signed)
Subjective:    Patient ID: Brittney Simpson, female    DOB: 09-May-1928, 88 y.o.   MRN: [id number]  Brittney Simpson is a 88 y.o. female presenting on 06/24/2022 for Annual Exam   HPI  Here for Annual Physical not ready for fasting lab. She ate this AM. Here with family caregiver  HTN CKD Recurrent UTI Followed by Dr Juleen China Previously on antibiotic prophylaxis for UTI   Fall backwards, had issue with Right Hip Pain. Following with Pain Management.   RLS Essential Temors Describes whole body quivering and tremor previously Keeping up at night with RLS Episodes of sweating, perspiring at times. Now improved on Gabapentin '100mg'$  every other night due to dizziness   Currently eating well and sleeping well.   Anemia Prior lab with anemia, Microcytic low Hgb. Due for labs. Admits fatigue and tired.   Anxiety Improved managed on Clonazepam Not due for refill, has on file.   Health Maintenance: Flu shot today     06/24/2022    8:51 AM 04/30/2022   10:37 AM 12/17/2021   10:59 AM  Depression screen PHQ 2/9  Decreased Interest 0 0 0  Down, Depressed, Hopeless 0 0 0  PHQ - 2 Score 0 0 0  Altered sleeping 0  0  Tired, decreased energy 0  1  Change in appetite 0  1  Feeling bad or failure about yourself  0  0  Trouble concentrating 0  0  Moving slowly or fidgety/restless 0  0  Suicidal thoughts 0  0  PHQ-9 Score 0  2  Difficult doing work/chores Not difficult at all  Somewhat difficult    Past Medical History:  Diagnosis Date   Anxiety    Arthritis    Back ache    Hiatal hernia    Hyperlipidemia    Hypertension    Hypothyroid    Osteopenia    Thyroid disease    UTI (urinary tract infection)    Past Surgical History:  Procedure Laterality Date   CHOLECYSTECTOMY     HEMORRHOID SURGERY     HERNIA REPAIR     HERNIA REPAIR     KYPHOPLASTY N/A 01/23/2017   Procedure: [id number];  Surgeon: Hessie Knows, MD;  Location: [facility] ORS;  Service:  Orthopedics;  Laterality: N/A;   KYPHOPLASTY N/A 01/25/2020   Procedure: L4 KYPHOPLASTY;  Surgeon: Hessie Knows, MD;  Location: [facility] OR[facility];  Service: Orthopedics;  Laterality: N/A;   Social History   Socioeconomic History   Marital status: Widowed    Spouse name: Not on file   Number of children: Not on file   Years of education: Not on file   Highest education level: Not on file  Occupational History   Not on file  Tobacco Use   Smoking status: Never   Smokeless tobacco: Never  Vaping Use   Vaping Use: Never used  Substance and Sexual Activity   Alcohol use: No    Alcohol/week: 0.0 standard drinks of alcohol   Drug use: No   Sexual activity: Not Currently    Birth control/protection: Injection  Other Topics Concern   Not on file  Social History Narrative   Not on file   Social Determinants of Health   Financial Resource Strain: Low Risk  (03/13/2021)   Overall Financial Resource Strain (CARDIA)    Difficulty of Paying Living Expenses: Not hard at all  Food Insecurity: No Food Insecurity (03/13/2021)   Hunger Vital Sign    Worried About Running  Out of Food in the Last Year: Never true    Ran Out of Food in the Last Year: Never true  Transportation Needs: No Transportation Needs (03/13/2021)   PRAPARE - Hydrologist (Medical): No    Lack of Transportation (Non-Medical): No  Physical Activity: Inactive (03/13/2021)   Exercise Vital Sign    Days of Exercise per Week: 0 days    Minutes of Exercise per Session: 0 min  Stress: No Stress Concern Present (03/13/2021)   [place]    Feeling of Stress : Not at all  Social Connections: Not on file  Intimate Partner Violence: Not on file   Family History  Problem Relation Age of Onset   Stroke Mother    Cancer Father    Current Outpatient Medications on File Prior to Visit  Medication Sig   acetaminophen (TYLENOL) 500 MG tablet  Take 1,000 mg by mouth daily as needed for moderate pain or headache.   amLODipine (NORVASC) 5 MG tablet TAKE 1 TABLET BY MOUTH ONCE DAILY WITH LUNCH   brimonidine (ALPHAGAN) 0.2 % ophthalmic solution Place 1 drop into the left eye 2 (two) times daily.   Cholecalciferol (VITAMIN D3) 50 MCG (2000 UT) TABS Take 2,000 Units by mouth daily with lunch.   clonazePAM (KLONOPIN) 0.5 MG tablet TAKE 1/2 TABLET BY MOUTH TWICE DAILY   Cranberry 500 MG CAPS Take 1,000 mg by mouth daily with supper.   furosemide (LASIX) 20 MG tablet TAKE 1 and 1/2 TABLETS BY MOUTH ONCE DAILY   latanoprost (XALATAN) 0.005 % ophthalmic solution Place 1 drop into both eyes at bedtime.    levothyroxine (SYNTHROID) 112 MCG tablet TAKE 1 TABLET BY MOUTH ONCE DAILY ON AN EMPTY STOMACH. WAIT 30 MINUTES BEFORE TAKING OTHER MEDS.   lisinopril (ZESTRIL) 40 MG tablet Take 1 tablet (40 mg total) by mouth daily with lunch.   Melatonin 10 MG TABS Take 10 mg by mouth at bedtime.   omeprazole (PRILOSEC) 20 MG capsule TAKE 1 CAPSULE BY MOUTH ONCE DAILY BEFORE BREAKFAST   tamsulosin (FLOMAX) 0.4 MG CAPS capsule Take 2 capsules (0.8 mg total) by mouth daily.   traMADol (ULTRAM) 50 MG tablet Take 1 tablet (50 mg total) by mouth 3 (three) times daily. Each refill must last 30 days.   traZODone (DESYREL) 50 MG tablet TAKE 1 TABLET BY MOUTH AT BEDTIME   methocarbamol (ROBAXIN) 500 MG tablet Take 1 tablet (500 mg total) by mouth daily as needed for muscle spasms.   No current facility-administered medications on file prior to visit.    Review of Systems Per HPI unless specifically indicated above      Objective:    BP (!) 144/64 (BP Location: Left Arm, Cuff Size: Normal)   Pulse 60   Ht '5\' 4"'$  (1.626 m)   Wt 121 lb (54.9 kg)   SpO2 98%   BMI 20.77 kg/m   Wt Readings from Last 3 Encounters:  06/24/22 121 lb (54.9 kg)  06/10/22 117 lb (53.1 kg)  05/28/22 119 lb 12.8 oz (54.3 kg)    Physical Exam Vitals and nursing note reviewed.   Constitutional:      General: She is not in acute distress.    Appearance: She is well-developed. She is not diaphoretic.     Comments: Well-appearing, comfortable, cooperative  HENT:     Head: Normocephalic and atraumatic.  Eyes:     General:  Right eye: No discharge.        Left eye: No discharge.     Conjunctiva/sclera: Conjunctivae normal.  Neck:     Thyroid: No thyromegaly.  Cardiovascular:     Rate and Rhythm: Normal rate and regular rhythm.     Heart sounds: Normal heart sounds. No murmur heard. Pulmonary:     Effort: Pulmonary effort is normal. No respiratory distress.     Breath sounds: Normal breath sounds. No wheezing or rales.  Musculoskeletal:        General: Normal range of motion.     Cervical back: Normal range of motion and neck supple.  Lymphadenopathy:     Cervical: No cervical adenopathy.  Skin:    General: Skin is warm and dry.     Findings: No erythema or rash.  Neurological:     Mental Status: She is alert and oriented to person, place, and time.  Psychiatric:        Behavior: Behavior normal.     Comments: Well groomed, good eye contact, normal speech and thoughts      Results for orders placed or performed during the hospital encounter of 05/14/22  I-STAT creatinine  Result Value Ref Range   Creatinine, Ser 1.00 0.44 - 1.00 mg/dL      Assessment & Plan:   Problem List Items Addressed This Visit     Bilateral hearing loss (Chronic)    Recommend Audiology for hearing aid, gave them contact info # and they will call If need referral, let me know.      Chronic pain syndrome (Chronic)   Benign hypertension with CKD (chronic kidney disease) stage III (HCC)    Controlled BP Complicated by CKD-III  Plan: 1. Continue current regimen - Amlodipine '5mg'$  daily, Lisinopril '40mg'$  daily, Metoprolol XL '100mg'$  daily - May continue Lasix '30mg'$  daily now per Nephrology - refilled 2. Encourage to stay active, low sodium diet, improve hydration with  water      Relevant Medications   metoprolol succinate (TOPROL-XL) 100 MG 24 hr tablet   Hypothyroidism    Previously controlled Return Friday 12/1 labs Keep current levothyroxine 166mg daily for now      Relevant Medications   metoprolol succinate (TOPROL-XL) 100 MG 24 hr tablet   Other Visit Diagnoses     Annual physical exam    -  Primary   Needs flu shot       Relevant Orders   Flu Vaccine QUAD High Dose(Fluad) (Completed)   OAB (overactive bladder)       RLS (restless legs syndrome)           Updated Health Maintenance information Fasting lab on Friday 12/1 Encouraged improvement to lifestyle with diet and exercise Goal of weight loss  Mild elevated BP, improved on repeat check. Okay with 140 / 60  Flu Shot today, high dose  Metoprolol medication refilled.  For hearing aid - please check with this location to see what they recommend on how to proceed  If you need a referral, please let me know.  [facility] Address: [address] [place] [address]Phone: [phone] Followed by Urology/Nephrology     Meds ordered this encounter  Medications   metoprolol succinate (TOPROL-XL) 100 MG 24 hr tablet    Sig: Take 1 tablet (100 mg total) by mouth daily. with food    Dispense:  90 tablet    Refill:  3  Follow up plan: Return in about 4 days (around 06/28/2022) for Return 4 days Fri 12/1 fasting lab only 8am.  Future labs ordered for Friday 12/1 at [place] 06/24/2022, 8:16 AM

## 2022-06-27 ENCOUNTER — Other Ambulatory Visit: Payer: Self-pay

## 2022-06-27 DIAGNOSIS — N183 Chronic kidney disease, stage 3 unspecified: Secondary | ICD-10-CM

## 2022-06-27 DIAGNOSIS — R7309 Other abnormal glucose: Secondary | ICD-10-CM

## 2022-06-27 DIAGNOSIS — E034 Atrophy of thyroid (acquired): Secondary | ICD-10-CM

## 2022-06-28 ENCOUNTER — Other Ambulatory Visit: Payer: Medicare Other

## 2022-06-28 DIAGNOSIS — I129 Hypertensive chronic kidney disease with stage 1 through stage 4 chronic kidney disease, or unspecified chronic kidney disease: Secondary | ICD-10-CM | POA: Diagnosis not present

## 2022-06-28 DIAGNOSIS — N183 Chronic kidney disease, stage 3 unspecified: Secondary | ICD-10-CM | POA: Diagnosis not present

## 2022-06-28 DIAGNOSIS — R7309 Other abnormal glucose: Secondary | ICD-10-CM | POA: Diagnosis not present

## 2022-06-28 DIAGNOSIS — E034 Atrophy of thyroid (acquired): Secondary | ICD-10-CM | POA: Diagnosis not present

## 2022-06-29 LAB — CBC WITH DIFFERENTIAL/PLATELET
Absolute Monocytes: 410 cells/uL (ref 200–950)
Basophils Absolute: 19 cells/uL (ref 0–200)
Basophils Relative: 0.5 %
Eosinophils Absolute: 42 cells/uL (ref 15–500)
Eosinophils Relative: 1.1 %
HCT: 35 % (ref 35.0–45.0)
Hemoglobin: 10.8 g/dL — ABNORMAL LOW (ref 11.7–15.5)
Lymphs Abs: 1387 cells/uL (ref 850–3900)
MCH: 24.3 pg — ABNORMAL LOW (ref 27.0–33.0)
MCHC: 30.9 g/dL — ABNORMAL LOW (ref 32.0–36.0)
MCV: 78.7 fL — ABNORMAL LOW (ref 80.0–100.0)
MPV: 11 fL (ref 7.5–12.5)
Monocytes Relative: 10.8 %
Neutro Abs: 1942 cells/uL (ref 1500–7800)
Neutrophils Relative %: 51.1 %
Platelets: 197 10*3/uL (ref 140–400)
RBC: 4.45 10*6/uL (ref 3.80–5.10)
RDW: 13.8 % (ref 11.0–15.0)
Total Lymphocyte: 36.5 %
WBC: 3.8 10*3/uL (ref 3.8–10.8)

## 2022-06-29 LAB — LIPID PANEL
Cholesterol: 214 mg/dL — ABNORMAL HIGH (ref <200)
HDL: 43 mg/dL — ABNORMAL LOW (ref >=50)
LDL Cholesterol (Calc): 145 mg/dL (calc) — ABNORMAL HIGH
Non-HDL Cholesterol (Calc): 171 mg/dL (calc) — ABNORMAL HIGH (ref <130)
Total CHOL/HDL Ratio: 5 (calc) — ABNORMAL HIGH (ref <5.0)
Triglycerides: 133 mg/dL (ref <150)

## 2022-06-29 LAB — COMPLETE METABOLIC PANEL WITH GFR
AG Ratio: 1.5 (calc) (ref 1.0–2.5)
ALT: 7 U/L (ref 6–29)
AST: 9 U/L — ABNORMAL LOW (ref 10–35)
Albumin: 4.1 g/dL (ref 3.6–5.1)
Alkaline phosphatase (APISO): 61 U/L (ref 37–153)
BUN/Creatinine Ratio: 13 (calc) (ref 6–22)
BUN: 13 mg/dL (ref 7–25)
CO2: 27 mmol/L (ref 20–32)
Calcium: 9.3 mg/dL (ref 8.6–10.4)
Chloride: 100 mmol/L (ref 98–110)
Creat: 1.01 mg/dL — ABNORMAL HIGH (ref 0.60–0.95)
Globulin: 2.7 g/dL (calc) (ref 1.9–3.7)
Glucose, Bld: 98 mg/dL (ref 65–99)
Potassium: 4.2 mmol/L (ref 3.5–5.3)
Sodium: 137 mmol/L (ref 135–146)
Total Bilirubin: 0.3 mg/dL (ref 0.2–1.2)
Total Protein: 6.8 g/dL (ref 6.1–8.1)
eGFR: 52 mL/min/{1.73_m2} — ABNORMAL LOW (ref >=60)

## 2022-06-29 LAB — HEMOGLOBIN A1C
Hgb A1c MFr Bld: 5.8 % of total Hgb — ABNORMAL HIGH (ref <5.7)
Mean Plasma Glucose: 120 mg/dL
eAG (mmol/L): 6.6 mmol/L

## 2022-06-29 LAB — TSH: TSH: 2.77 mIU/L (ref 0.40–4.50)

## 2022-06-29 LAB — T4, FREE: Free T4: 1.3 ng/dL (ref 0.8–1.8)

## 2022-07-03 ENCOUNTER — Other Ambulatory Visit: Payer: Self-pay | Admitting: Family Medicine

## 2022-07-03 DIAGNOSIS — K219 Gastro-esophageal reflux disease without esophagitis: Secondary | ICD-10-CM

## 2022-07-03 NOTE — Telephone Encounter (Signed)
Requested Prescriptions  Pending Prescriptions Disp Refills   omeprazole (PRILOSEC) 20 MG capsule [Pharmacy Med Name: OMEPRAZOLE DR 20 MG CAP] 90 capsule 1    Sig: TAKE 1 CAPSULE BY MOUTH ONCE DAILY BEFORE BREAKFAST     Gastroenterology: Proton Pump Inhibitors Passed - 07/03/2022 12:29 PM      Passed - Valid encounter within last 12 months    Recent Outpatient Visits           1 week ago Annual physical exam   [place], DO   6 months ago RLS (restless legs syndrome)   [facility] Olin Hauser, DO   8 months ago Benign hypertension with CKD (chronic kidney disease) stage III [facility])   [facility] Olin Hauser, DO   1 year ago Benign hypertension with CKD (chronic kidney disease) stage III [facility])   [place], DO   1 year ago RUQ abdominal pain   [facility] Olin Hauser, DO       Future Appointments             In 11 months MacDiarmid, Nicki Reaper, [place]

## 2022-07-08 DIAGNOSIS — N390 Urinary tract infection, site not specified: Secondary | ICD-10-CM | POA: Diagnosis not present

## 2022-07-08 DIAGNOSIS — N1831 Chronic kidney disease, stage 3a: Secondary | ICD-10-CM | POA: Diagnosis not present

## 2022-07-08 DIAGNOSIS — I10 Essential (primary) hypertension: Secondary | ICD-10-CM | POA: Diagnosis not present

## 2022-07-16 ENCOUNTER — Other Ambulatory Visit: Payer: Self-pay | Admitting: Family Medicine

## 2022-07-16 DIAGNOSIS — N183 Chronic kidney disease, stage 3 unspecified: Secondary | ICD-10-CM

## 2022-07-16 NOTE — Telephone Encounter (Signed)
Requested Prescriptions  Pending Prescriptions Disp Refills   amLODipine (NORVASC) 5 MG tablet [Pharmacy Med Name: AMLODIPINE BESYLATE 5 MG TAB] 90 tablet 0    Sig: TAKE 1 TABLET BY MOUTH ONCE DAILY WITH LUNCH     Cardiovascular: Calcium Channel Blockers 2 Failed - 07/16/2022 10:41 AM      Failed - Last BP in normal range    BP Readings from Last 1 Encounters:  06/24/22 (!) 144/64         Passed - Last Heart Rate in normal range    Pulse Readings from Last 1 Encounters:  06/24/22 60         Passed - Valid encounter within last 6 months    Recent Outpatient Visits           3 weeks ago Annual physical exam   [place], DO   7 months ago RLS (restless legs syndrome)   [facility] Olin Hauser, DO   8 months ago Benign hypertension with CKD (chronic kidney disease) stage III [facility])   [facility] Olin Hauser, DO   1 year ago Benign hypertension with CKD (chronic kidney disease) stage III [facility])   [place], DO   1 year ago RUQ abdominal pain   [facility] [place], Devonne Doughty, DO       Future Appointments             In 10 months MacDiarmid, Nicki Reaper, [place]

## 2022-07-25 ENCOUNTER — Other Ambulatory Visit: Payer: Self-pay | Admitting: Family Medicine

## 2022-07-25 DIAGNOSIS — I129 Hypertensive chronic kidney disease with stage 1 through stage 4 chronic kidney disease, or unspecified chronic kidney disease: Secondary | ICD-10-CM

## 2022-07-27 NOTE — Telephone Encounter (Signed)
Requested Prescriptions  Pending Prescriptions Disp Refills   lisinopril (ZESTRIL) 40 MG tablet [Pharmacy Med Name: LISINOPRIL 40 MG TAB] 90 tablet 0    Sig: TAKE 1 TABLET BY MOUTH ONCE DAILY     Cardiovascular:  ACE Inhibitors Failed - 07/25/2022  1:25 PM      Failed - Cr in normal range and within 180 days    Creat  Date Value Ref Range Status  06/28/2022 1.01 (H) 0.60 - 0.95 mg/dL Final         Failed - Last BP in normal range    BP Readings from Last 1 Encounters:  06/24/22 (!) 144/64         Passed - K in normal range and within 180 days    Potassium  Date Value Ref Range Status  06/28/2022 4.2 3.5 - 5.3 mmol/L Final  11/24/2014 4.2 mmol/L Final    Comment:    3.5-5.1 NOTE: New Reference Range  10/04/14          Passed - Patient is not pregnant      Passed - Valid encounter within last 6 months    Recent Outpatient Visits           1 month ago Annual physical exam   [place], DO   7 months ago RLS (restless legs syndrome)   [facility] Olin Hauser, DO   8 months ago Benign hypertension with CKD (chronic kidney disease) stage III [facility])   [facility] Olin Hauser, DO   1 year ago Benign hypertension with CKD (chronic kidney disease) stage III [facility])   [place], DO   1 year ago RUQ abdominal pain   [facility] [place], Devonne Doughty, DO       Future Appointments             In 10 months MacDiarmid, Nicki Reaper, [place]

## 2022-07-31 ENCOUNTER — Telehealth (INDEPENDENT_AMBULATORY_CARE_PROVIDER_SITE_OTHER): Payer: Self-pay | Admitting: Vascular Surgery

## 2022-07-31 NOTE — Telephone Encounter (Signed)
LVM for daughter of pt to give [place] a call at the office to get a CT results appt scheduled with Dr. Lucky Cowboy after the CT exam.   CT results. See JD. (CT scheduled for 4.30.24)

## 2022-08-07 ENCOUNTER — Other Ambulatory Visit: Payer: Self-pay | Admitting: Family Medicine

## 2022-08-07 DIAGNOSIS — F5101 Primary insomnia: Secondary | ICD-10-CM

## 2022-08-07 DIAGNOSIS — F419 Anxiety disorder, unspecified: Secondary | ICD-10-CM

## 2022-08-08 NOTE — Telephone Encounter (Signed)
Requested medication (s) are due for refill today:   Provider to review  Requested medication (s) are on the active medication list:   Yes  Future visit scheduled:   No   Had physical a mo. ago   Last ordered: 05/23/2022 #30, 2 refills  Non delegated refill   Requested Prescriptions  Pending Prescriptions Disp Refills   clonazePAM (KLONOPIN) 0.5 MG tablet [Pharmacy Med Name: CLONAZEPAM 0.5 MG TAB] 30 tablet     Sig: TAKE 1/2 TABLET BY MOUTH TWICE DAILY     Not Delegated - Psychiatry: Anxiolytics/Hypnotics 2 Failed - 08/07/2022  4:58 PM      Failed - This refill cannot be delegated      Failed - Urine Drug Screen completed in last 360 days      Passed - Patient is not pregnant      Passed - Valid encounter within last 6 months    Recent Outpatient Visits           1 month ago Annual physical exam   [place], [place]   7 months ago RLS (restless legs syndrome)   [facility], Devonne Doughty, DO   9 months ago Benign hypertension with CKD (chronic kidney disease) stage III [facility])   [facility] Olin Hauser, DO   1 year ago Benign hypertension with CKD (chronic kidney disease) stage III [facility])   [place], DO   1 year ago RUQ abdominal pain   [facility], Devonne Doughty, DO       Future Appointments             In 10 months MacDiarmid, Nicki Reaper, [place]

## 2022-08-09 ENCOUNTER — Other Ambulatory Visit: Payer: Self-pay | Admitting: Family Medicine

## 2022-08-09 DIAGNOSIS — N183 Chronic kidney disease, stage 3 unspecified: Secondary | ICD-10-CM

## 2022-08-09 NOTE — Telephone Encounter (Signed)
Requested Prescriptions  Pending Prescriptions Disp Refills   furosemide (LASIX) 20 MG tablet [Pharmacy Med Name: FUROSEMIDE 20 MG TAB] 45 tablet 2    Sig: TAKE 1 and 1/2 TABLETS BY MOUTH ONCE DAILY     Cardiovascular:  Diuretics - Loop Failed - 08/09/2022  9:11 AM      Failed - Cr in normal range and within 180 days    Creat  Date Value Ref Range Status  06/28/2022 1.01 (H) 0.60 - 0.95 mg/dL Final         Failed - Mg Level in normal range and within 180 days    Magnesium  Date Value Ref Range Status  01/14/2014 2.1 mg/dL Final    Comment:    1.8-2.4 THERAPEUTIC RANGE: 4-7 mg/dL TOXIC: > 10 mg/dL  -----------------------          Failed - Last BP in normal range    BP Readings from Last 1 Encounters:  06/24/22 (!) 144/64         Passed - K in normal range and within 180 days    Potassium  Date Value Ref Range Status  06/28/2022 4.2 3.5 - 5.3 mmol/L Final  11/24/2014 4.2 mmol/L Final    Comment:    3.5-5.1 NOTE: New Reference Range  10/04/14          Passed - Ca in normal range and within 180 days    Calcium  Date Value Ref Range Status  06/28/2022 9.3 8.6 - 10.4 mg/dL Final   Calcium, Total  Date Value Ref Range Status  11/24/2014 8.9 mg/dL Final    Comment:    8.9-10.3 NOTE: New Reference Range  10/04/14          Passed - Na in normal range and within 180 days    Sodium  Date Value Ref Range Status  06/28/2022 137 135 - 146 mmol/L Final  11/24/2014 131 (L) mmol/L Final    Comment:    135-145 NOTE: New Reference Range  10/04/14          Passed - Cl in normal range and within 180 days    Chloride  Date Value Ref Range Status  06/28/2022 100 98 - 110 mmol/L Final  11/24/2014 97 (L) mmol/L Final    Comment:    101-111 NOTE: New Reference Range  10/04/14          Passed - Valid encounter within last 6 months    Recent Outpatient Visits           1 month ago Annual physical exam   [place], DO    7 months ago RLS (restless legs syndrome)   [facility] Olin Hauser, DO   9 months ago Benign hypertension with CKD (chronic kidney disease) stage III [facility])   [facility] Olin Hauser, DO   1 year ago Benign hypertension with CKD (chronic kidney disease) stage III [facility])   [place], DO   1 year ago RUQ abdominal pain   [facility] Olin Hauser, DO       Future Appointments             In 10 months MacDiarmid, Nicki Reaper, [place]

## 2022-08-26 NOTE — Progress Notes (Unsigned)
PROVIDER NOTE: Interpretation of information contained herein should be left to medically-trained personnel. Specific patient instructions are provided elsewhere under "Patient Instructions" section of medical record. This document was created in part using STT-dictation technology, any transcriptional errors that may result from this process are unintentional.  Patient: Brittney Simpson Type: Established DOB: [birth year] MRN: [id number] PCP: Olin Hauser, DO  Service: Procedure DOS: 08/27/2022 Setting: Ambulatory Location: Ambulatory outpatient facility Delivery: Face-to-face Provider: Gaspar Cola, MD Specialty: Interventional Pain Management Specialty designation: 09 Location: Outpatient facility Ref. Prov.: Nobie Putnam *    Primary Reason for Visit: Interventional Pain Management Treatment. CC: No chief complaint on file.   Procedure:           Type: Lumbar epidural steroid injection (LESI) (interlaminar)  #8     Laterality: Right   Level:  T12-L1 Level.  Imaging: Fluoroscopic guidance         Anesthesia: Local anesthesia (1-2% Lidocaine) Anxiolysis: None                 Sedation:                         DOS: 08/27/2022  Performed by: Gaspar Cola, MD  Purpose: Diagnostic/Therapeutic Indications: Lumbar radicular pain of intraspinal etiology of more than 4 weeks that has failed to respond to conservative therapy and is severe enough to impact quality of life or function. No diagnosis found. NAS-11 Pain score:   Pre-procedure:  /10   Post-procedure:  /10      Position / Prep / Materials:  Position: Prone w/ head of the table raised (slight reverse trendelenburg) to facilitate breathing.  Prep solution: DuraPrep (Iodine Povacrylex [0.7% available iodine] and Isopropyl Alcohol, 74% w/w) Prep Area: Entire Posterior Lumbar Region from lower scapular tip down to mid buttocks area and from flank to flank. Materials:  Tray: Epidural  tray Needle(s):  Type: Epidural needle (Tuohy) Gauge (G):  17 Length: Regular (3.5-in) Qty: 1  Pre-op H&P Assessment:  [name] is a 89 y.o. (year old), female patient, seen today for interventional treatment. She  has a past surgical history that includes Hernia repair; Hernia repair; Hemorrhoid surgery; Kyphoplasty (N/A, 01/23/2017); Kyphoplasty (N/A, 01/25/2020); and Cholecystectomy. [name] has a current medication list which includes the following prescription(s): acetaminophen, amlodipine, brimonidine, vitamin d3, clonazepam, cranberry, furosemide, latanoprost, levothyroxine, lisinopril, melatonin, methocarbamol, metoprolol succinate, omeprazole, tamsulosin, tramadol, and trazodone. Her primarily concern today is the No chief complaint on file.  Initial Vital Signs:  Pulse/HCG Rate:    Temp:   Resp:   BP:   SpO2:    BMI: Estimated body mass index is 20.77 kg/m as calculated from the following:   Height as of 06/24/22: '5\' 4"'$  (1.626 m).   Weight as of 06/24/22: 121 lb (54.9 kg).  Risk Assessment: Allergies: Reviewed. She has No Known Allergies.  Allergy Precautions: None required Coagulopathies: Reviewed. None identified.  Blood-thinner therapy: None at this time Active Infection(s): Reviewed. None identified. [name] is afebrile  Site Confirmation: [name] was asked to confirm the procedure and laterality before marking the site Procedure checklist: Completed Consent: Before the procedure and under the influence of no sedative(s), amnesic(s), or anxiolytics, the patient was informed of the treatment options, risks and possible complications. To fulfill our ethical and legal obligations, as recommended by the American Medical Association's Code of Ethics, I have informed the patient of my clinical impression; the nature and purpose of the  treatment or procedure; the risks, benefits, and possible complications of the intervention; the alternatives, including doing  nothing; the risk(s) and benefit(s) of the alternative treatment(s) or procedure(s); and the risk(s) and benefit(s) of doing nothing. The patient was provided information about the general risks and possible complications associated with the procedure. These may include, but are not limited to: failure to achieve desired goals, infection, bleeding, organ or nerve damage, allergic reactions, paralysis, and death. In addition, the patient was informed of those risks and complications associated to Spine-related procedures, such as failure to decrease pain; infection (i.e.: Meningitis, epidural or intraspinal abscess); bleeding (i.e.: epidural hematoma, subarachnoid hemorrhage, or any other type of intraspinal or peri-dural bleeding); organ or nerve damage (i.e.: Any type of peripheral nerve, nerve root, or spinal cord injury) with subsequent damage to sensory, motor, and/or autonomic systems, resulting in permanent pain, numbness, and/or weakness of one or several areas of the body; allergic reactions; (i.e.: anaphylactic reaction); and/or death. Furthermore, the patient was informed of those risks and complications associated with the medications. These include, but are not limited to: allergic reactions (i.e.: anaphylactic or anaphylactoid reaction(s)); adrenal axis suppression; blood sugar elevation that in diabetics may result in ketoacidosis or comma; water retention that in patients with history of congestive heart failure may result in shortness of breath, pulmonary edema, and decompensation with resultant heart failure; weight gain; swelling or edema; medication-induced neural toxicity; particulate matter embolism and blood vessel occlusion with resultant organ, and/or nervous system infarction; and/or aseptic necrosis of one or more joints. Finally, the patient was informed that Medicine is not an exact science; therefore, there is also the possibility of unforeseen or unpredictable risks and/or possible  complications that may result in a catastrophic outcome. The patient indicated having understood very clearly. We have given the patient no guarantees and we have made no promises. Enough time was given to the patient to ask questions, all of which were answered to the patient's satisfaction. Ms. Reitan has indicated that she wanted to continue with the procedure. Attestation: I, the ordering provider, attest that I have discussed with the patient the benefits, risks, side-effects, alternatives, likelihood of achieving goals, and potential problems during recovery for the procedure that I have provided informed consent. Date  Time: {CHL [facility]-PAIN TIME CHOICES:21018001}  Pre-Procedure Preparation:  Monitoring: As per clinic protocol. Respiration, ETCO2, SpO2, BP, heart rate and rhythm monitor placed and checked for adequate function Safety Precautions: Patient was assessed for positional comfort and pressure points before starting the procedure. Time-out: I initiated and conducted the "Time-out" before starting the procedure, as per protocol. The patient was asked to participate by confirming the accuracy of the "Time Out" information. Verification of the correct person, site, and procedure were performed and confirmed by me, the nursing staff, and the patient. "Time-out" conducted as per Joint Commission's Universal Protocol (UP.01.01.01). Time:    Description/Narrative of Procedure:          Target: Epidural space via interlaminar opening, initially targeting the lower laminar border of the superior vertebral body. Region: Lumbar Approach: Percutaneous paravertebral  Rationale (medical necessity): procedure needed and proper for the diagnosis and/or treatment of the patient's medical symptoms and needs. Procedural Technique Safety Precautions: Aspiration looking for blood return was conducted prior to all injections. At no point did we inject any substances, as a needle was being advanced. No  attempts were made at seeking any paresthesias. Safe injection practices and needle disposal techniques used. Medications properly checked for expiration dates. SDV (  single dose vial) medications used. Description of the Procedure: Protocol guidelines were followed. The procedure needle was introduced through the skin, ipsilateral to the reported pain, and advanced to the target area. Bone was contacted and the needle walked caudad, until the lamina was cleared. The epidural space was identified using "loss-of-resistance technique" with 2-3 ml of PF-NaCl (0.9% NSS), in a 5cc LOR glass syringe.  There were no vitals filed for this visit.  Start Time:   hrs. End Time:   hrs.  Imaging Guidance (Spinal):          Type of Imaging Technique: Fluoroscopy Guidance (Spinal) Indication(s): Assistance in needle guidance and placement for procedures requiring needle placement in or near specific anatomical locations not easily accessible without such assistance. Exposure Time: Please see nurses notes. Contrast: Before injecting any contrast, we confirmed that the patient did not have an allergy to iodine, shellfish, or radiological contrast. Once satisfactory needle placement was completed at the desired level, radiological contrast was injected. Contrast injected under live fluoroscopy. No contrast complications. See chart for type and volume of contrast used. Fluoroscopic Guidance: I was personally present during the use of fluoroscopy. "Tunnel Vision Technique" used to obtain the best possible view of the target area. Parallax error corrected before commencing the procedure. "Direction-depth-direction" technique used to introduce the needle under continuous pulsed fluoroscopy. Once target was reached, antero-posterior, oblique, and lateral fluoroscopic projection used confirm needle placement in all planes. Images permanently stored in EMR. Interpretation: I personally interpreted the imaging intraoperatively.  Adequate needle placement confirmed in multiple planes. Appropriate spread of contrast into desired area was observed. No evidence of afferent or efferent intravascular uptake. No intrathecal or subarachnoid spread observed. Permanent images saved into the patient's record.  Antibiotic Prophylaxis:   Anti-infectives (From admission, onward)    None      Indication(s): None identified  Post-operative Assessment:  Post-procedure Vital Signs:  Pulse/HCG Rate:    Temp:   Resp:   BP:   SpO2:    EBL: None  Complications: No immediate post-treatment complications observed by team, or reported by patient.  Note: The patient tolerated the entire procedure well. A repeat set of vitals were taken after the procedure and the patient was kept under observation following institutional policy, for this type of procedure. Post-procedural neurological assessment was performed, showing return to baseline, prior to discharge. The patient was provided with post-procedure discharge instructions, including a section on how to identify potential problems. Should any problems arise concerning this procedure, the patient was given instructions to immediately contact us, at any time, without hesitation. In any case, we plan to contact the patient by telephone for a follow-up status report regarding this interventional procedure.  Comments:  No additional relevant information.  Plan of Care  Orders:  No orders of the defined types were placed in this encounter.  Chronic Opioid Analgesic:  Tramadol 50 mg, 1 tab PO q 8 hrs (150 mg/day of tramadol) MME/day: 15 mg/day.   Medications ordered for procedure: No orders of the defined types were placed in this encounter.  Medications administered: [name] had no medications administered during this visit.  See the medical record for exact dosing, route, and time of administration.  Follow-up plan:   No follow-ups on file.       Interventional  Therapies  Risk  Complexity Considerations:   Estimated body mass index is 20.19 kg/m as calculated from the following:   Height as of this encounter: '5\' 3"'$  (1.6  m).   Weight as of this encounter: 114 lb (51.7 kg). Note: Hard of hearing   Planned  Pending:      Under consideration:   Diagnostic right hip joint injection  Diagnostic right trochanteric bursa injection  Diagnostic midline caudal ESI (for the tailbone pain)   Completed:   Palliative right T12-L1 LESI x6 (01/22/2022) (100/100/80/80)  Therapeutic right L2-3 LESI x1 (03/07/2020) (100/100/25/<50)  Diagnostic/therapeutic bilateral SI joint injection x1 (02/10/2020) (100/100/100 x2 days/0)  Palliative right lumbar facet MBB x13 (01/04/2020) (8 to 0) (100/100/100)  Palliative left lumbar facet MBB x11 (01/04/2020) (8 to 0) (100/100/100)    Therapeutic  Palliative (PRN) options:   Palliative right T12-L1 LESI (PRN)     Recent Visits No visits were found meeting these conditions. Showing recent visits within past 90 days and meeting all other requirements Future Appointments Date Type Provider Dept  08/27/22 Appointment Milinda Pointer, MD [facility]  Showing future appointments within next 90 days and meeting all other requirements  Disposition: Discharge home  Discharge (Date  Time): 08/27/2022;   hrs.   Primary Care Physician: Olin Hauser, DO Location: [facility] Note by: Gaspar Cola, MD Date: 08/27/2022; Time: 11:23 AM  Disclaimer:  Medicine is not an Chief Strategy Officer. The only guarantee in medicine is that nothing is guaranteed. It is important to note that the decision to proceed with this intervention was based on the information collected from the patient. The Data and conclusions were drawn from the patient's questionnaire, the interview, and the physical examination. Because the information was provided in large part by the patient, it cannot be  guaranteed that it has not been purposely or unconsciously manipulated. Every effort has been made to obtain as much relevant data as possible for this evaluation. It is important to note that the conclusions that lead to this procedure are derived in large part from the available data. Always take into account that the treatment will also be dependent on availability of resources and existing treatment guidelines, considered by other Pain Management Practitioners as being common knowledge and practice, at the time of the intervention. For Medico-Legal purposes, it is also important to point out that variation in procedural techniques and pharmacological choices are the acceptable norm. The indications, contraindications, technique, and results of the above procedure should only be interpreted and judged by a Board-Certified Interventional Pain Specialist with extensive familiarity and expertise in the same exact procedure and technique.

## 2022-08-27 ENCOUNTER — Ambulatory Visit
Admission: RE | Admit: 2022-08-27 | Discharge: 2022-08-27 | Disposition: A | Discharge status: Home / Self Care | Payer: Medicare Other | Source: Ambulatory Visit | Attending: Pain Medicine | Admitting: Pain Medicine

## 2022-08-27 ENCOUNTER — Encounter: Payer: Self-pay | Admitting: Pain Medicine

## 2022-08-27 ENCOUNTER — Ambulatory Visit: Payer: Medicare Other | Attending: Pain Medicine | Admitting: Pain Medicine

## 2022-08-27 VITALS — BP 175/77 | HR 63 | Temp 97.7°F | Resp 16 | Ht 63.0 in | Wt 115.0 lb

## 2022-08-27 DIAGNOSIS — M4317 Spondylolisthesis, lumbosacral region: Secondary | ICD-10-CM | POA: Diagnosis not present

## 2022-08-27 DIAGNOSIS — S22080S Wedge compression fracture of T11-T12 vertebra, sequela: Secondary | ICD-10-CM

## 2022-08-27 DIAGNOSIS — H900 Conductive hearing loss, bilateral: Secondary | ICD-10-CM | POA: Diagnosis not present

## 2022-08-27 DIAGNOSIS — M5441 Lumbago with sciatica, right side: Secondary | ICD-10-CM | POA: Diagnosis not present

## 2022-08-27 DIAGNOSIS — M48062 Spinal stenosis, lumbar region with neurogenic claudication: Secondary | ICD-10-CM

## 2022-08-27 DIAGNOSIS — M5416 Radiculopathy, lumbar region: Secondary | ICD-10-CM | POA: Insufficient documentation

## 2022-08-27 DIAGNOSIS — M5135 Other intervertebral disc degeneration, thoracolumbar region: Secondary | ICD-10-CM

## 2022-08-27 DIAGNOSIS — R937 Abnormal findings on diagnostic imaging of other parts of musculoskeletal system: Secondary | ICD-10-CM | POA: Diagnosis not present

## 2022-08-27 DIAGNOSIS — M48061 Spinal stenosis, lumbar region without neurogenic claudication: Secondary | ICD-10-CM

## 2022-08-27 DIAGNOSIS — G8929 Other chronic pain: Secondary | ICD-10-CM

## 2022-08-27 MED ORDER — SODIUM CHLORIDE (PF) 0.9 % IJ SOLN
INTRAMUSCULAR | Status: AC
  Filled 2022-08-27: qty 10

## 2022-08-27 MED ORDER — ROPIVACAINE HCL 2 MG/ML IJ SOLN
INTRAMUSCULAR | Status: AC
  Filled 2022-08-27: qty 20

## 2022-08-27 MED ORDER — PENTAFLUOROPROP-TETRAFLUOROETH EX AERO
INHALATION_SPRAY | Freq: Once | CUTANEOUS | Status: AC
  Administered 2022-08-27: 30 via TOPICAL
  Filled 2022-08-27: qty 116

## 2022-08-27 MED ORDER — SODIUM CHLORIDE 0.9% FLUSH
2.0000 mL | Freq: Once | INTRAVENOUS | Status: AC
  Administered 2022-08-27: 2 mL

## 2022-08-27 MED ORDER — TRIAMCINOLONE ACETONIDE 40 MG/ML IJ SUSP
INTRAMUSCULAR | Status: AC
  Filled 2022-08-27: qty 1

## 2022-08-27 MED ORDER — LIDOCAINE HCL 2 % IJ SOLN
20.0000 mL | Freq: Once | INTRAMUSCULAR | Status: AC
  Administered 2022-08-27: 400 mg

## 2022-08-27 MED ORDER — LIDOCAINE HCL 2 % IJ SOLN
INTRAMUSCULAR | Status: AC
  Filled 2022-08-27: qty 20

## 2022-08-27 MED ORDER — TRIAMCINOLONE ACETONIDE 40 MG/ML IJ SUSP
40.0000 mg | Freq: Once | INTRAMUSCULAR | Status: AC
  Administered 2022-08-27: 40 mg

## 2022-08-27 MED ORDER — IOHEXOL 180 MG/ML  SOLN
INTRAMUSCULAR | Status: AC
  Filled 2022-08-27: qty 20

## 2022-08-27 MED ORDER — ROPIVACAINE HCL 2 MG/ML IJ SOLN
2.0000 mL | Freq: Once | INTRAMUSCULAR | Status: AC
  Administered 2022-08-27: 2 mL via EPIDURAL

## 2022-08-27 MED ORDER — IOHEXOL 180 MG/ML  SOLN
10.0000 mL | Freq: Once | INTRAMUSCULAR | Status: AC
  Administered 2022-08-27: 10 mL via EPIDURAL

## 2022-08-27 NOTE — Patient Instructions (Signed)
  ____________________________________________________________________________________________  Patient Information update  To: All of our patients.  Re: Name change.  It has been made official that our current name, "[facility]"   will soon be changed to "[facility]".   The purpose of this change is to eliminate any confusion created by the concept of our practice being a "Medication Management Pain Clinic". In the past this has led to the misconception that we treat pain primarily by the use of prescription medications.  Nothing can be farther from the truth.   Understanding PAIN MANAGEMENT: To further understand what our practice does, you first have to understand that "Pain Management" is a subspecialty that requires additional training once a physician has completed their specialty training, which can be in either Anesthesia, Neurology, Psychiatry, or Physical Medicine and Rehabilitation (PMR). Each one of these contributes to the final approach taken by each physician to the management of their patient's pain. To be a "Pain Management Specialist" you must have first completed one of the specialty trainings below.  Anesthesiologists - trained in clinical pharmacology and interventional techniques such as nerve blockade and regional as well as central neuroanatomy. They are trained to block pain before, during, and after surgical interventions.  Neurologists - trained in the diagnosis and pharmacological treatment of complex neurological conditions, such as Multiple Sclerosis, Parkinson's, spinal cord injuries, and other systemic conditions that may be associated with symptoms that may include but are not limited to pain. They tend to rely primarily on the treatment of chronic pain using prescription medications.  Psychiatrist - trained in conditions affecting the psychosocial  wellbeing of patients including but not limited to depression, anxiety, schizophrenia, personality disorders, addiction, and other substance use disorders that may be associated with chronic pain. They tend to rely primarily on the treatment of chronic pain using prescription medications.   Physical Medicine and Rehabilitation (PMR) physicians, also known as physiatrists - trained to treat a wide variety of medical conditions affecting the brain, spinal cord, nerves, bones, joints, ligaments, muscles, and tendons. Their training is primarily aimed at treating patients that have suffered injuries that have caused severe physical impairment. Their training is primarily aimed at the physical therapy and rehabilitation of those patients. They may also work alongside orthopedic surgeons or neurosurgeons using their expertise in assisting surgical patients to recover after their surgeries.  INTERVENTIONAL PAIN MANAGEMENT is sub-subspecialty of Pain Management.  Our physicians are Board-certified in Anesthesia, Pain Management, and Interventional Pain Management.  This meaning that not only have they been trained and Board-certified in their specialty of Anesthesia, and subspecialty of Pain Management, but they have also received further training in the sub-subspecialty of Interventional Pain Management, in order to become Board-certified as INTERVENTIONAL PAIN MANAGEMENT SPECIALIST.    Mission: Our goal is to use our skills in  INTERVENTIONAL PAIN MANAGEMENT as alternatives to the chronic use of prescription opioid medications for the treatment of pain. To make this more clear, we have changed our name to reflect what we do and offer. We will continue to offer medication management assessment and recommendations, but we will not be taking over any patient's medication management.  ____________________________________________________________________________________________       cheeks); mood swings; menstrual changes. Uncommon: Long-term decrease or suppression of natural hormones; bone thinning. (These are more common with higher doses or more frequent use. This is why we prefer that our patients avoid having any injection therapies in other practices.)  Very Rare: Severe mood changes; psychosis; aseptic necrosis. From procedure: Some discomfort is to be expected once  the numbing medicine wears off. This should be minimal if ice and heat are applied as instructed.  Call if: (When should I call?) You experience numbness and weakness that gets worse with time, as opposed to wearing off. New onset bowel or bladder incontinence. (Applies only to procedures done in the spine)  Emergency Numbers: Durning business hours (Monday - Thursday, 8:00 AM - 4:00 PM) (Friday, 9:00 AM - 12:00 Noon): [phone] After hours: [phone] NOTE: If you are having a problem and are unable connect with, or to talk to a provider, then go to your nearest urgent care or emergency department. If the problem is serious and urgent, please call 911. ____________________________________________________________________________________________    

## 2022-08-27 NOTE — Progress Notes (Signed)
Safety precautions to be maintained throughout the outpatient stay will include: orient to surroundings, keep bed in low position, maintain call bell within reach at all times, provide assistance with transfer out of bed and ambulation.  

## 2022-08-28 ENCOUNTER — Telehealth: Payer: Self-pay

## 2022-08-28 NOTE — Telephone Encounter (Signed)
Post procedure follow up.  LM with daughter to call us if she has any problems.

## 2022-08-30 DIAGNOSIS — H18513 Endothelial corneal dystrophy, bilateral: Secondary | ICD-10-CM | POA: Diagnosis not present

## 2022-08-30 DIAGNOSIS — H401133 Primary open-angle glaucoma, bilateral, severe stage: Secondary | ICD-10-CM | POA: Diagnosis not present

## 2022-08-30 DIAGNOSIS — Z961 Presence of intraocular lens: Secondary | ICD-10-CM | POA: Diagnosis not present

## 2022-09-12 ENCOUNTER — Encounter: Payer: Self-pay | Admitting: Pain Medicine

## 2022-09-12 ENCOUNTER — Ambulatory Visit: Payer: Medicare Other | Attending: Pain Medicine | Admitting: Pain Medicine

## 2022-09-12 DIAGNOSIS — M5416 Radiculopathy, lumbar region: Secondary | ICD-10-CM

## 2022-09-12 DIAGNOSIS — M5135 Other intervertebral disc degeneration, thoracolumbar region: Secondary | ICD-10-CM

## 2022-09-12 DIAGNOSIS — G8929 Other chronic pain: Secondary | ICD-10-CM

## 2022-09-12 DIAGNOSIS — S22080S Wedge compression fracture of T11-T12 vertebra, sequela: Secondary | ICD-10-CM | POA: Diagnosis not present

## 2022-09-12 DIAGNOSIS — M5441 Lumbago with sciatica, right side: Secondary | ICD-10-CM | POA: Diagnosis not present

## 2022-09-12 DIAGNOSIS — M4317 Spondylolisthesis, lumbosacral region: Secondary | ICD-10-CM | POA: Diagnosis not present

## 2022-09-12 DIAGNOSIS — R937 Abnormal findings on diagnostic imaging of other parts of musculoskeletal system: Secondary | ICD-10-CM

## 2022-09-12 DIAGNOSIS — M48062 Spinal stenosis, lumbar region with neurogenic claudication: Secondary | ICD-10-CM | POA: Diagnosis not present

## 2022-09-12 DIAGNOSIS — M48061 Spinal stenosis, lumbar region without neurogenic claudication: Secondary | ICD-10-CM

## 2022-09-12 NOTE — Patient Instructions (Signed)

## 2022-09-12 NOTE — Progress Notes (Signed)
Patient: Brittney Simpson  Service Category: E/M  Provider: Gaspar Cola, MD  DOB: 04-Aug-1927  DOS: 09/12/2022  Location: Office  MRN: [id number]  Setting: Ambulatory outpatient  Referring Provider: Nobie Putnam *  Type: Established Patient  Specialty: Interventional Pain Management  PCP: Olin Hauser, DO  Location: Remote location  Delivery: TeleHealth     Virtual Encounter - Pain Management PROVIDER NOTE: Information contained herein reflects review and annotations entered in association with encounter. Interpretation of such information and data should be left to medically-trained personnel. Information provided to patient can be located elsewhere in the medical record under "Patient Instructions". Document created using STT-dictation technology, any transcriptional errors that may result from process are unintentional.    Contact & Pharmacy Preferred: [place]: [phone] (home) Mobile: [phone] (mobile) E-mail: [email or web address]  [place], [place]. [employer] [address] [place] Phone: [phone] Fax: [phone]   Pre-screening  Brittney Simpson offered "in-person" vs "virtual" encounter. She indicated preferring virtual for this encounter.   Reason COVID-19*  Social distancing based on CDC and AMA recommendations.   I contacted ALKA GUTTIERREZ on 09/12/2022 via telephone.      I clearly identified myself as 09/25/2022, MD. I verified that I was speaking with the correct person using two identifiers (Name: Brittney Simpson, and date of birth: [birth year]).  Consent I sought verbal advanced consent from 01/08/1928 for virtual visit interactions. I informed [name] of possible security and privacy concerns, risks, and limitations associated with providing "not-in-person" medical evaluation and management services. I also informed [name] of the availability of "in-person"  appointments. Finally, I informed her that there would be a charge for the virtual visit and that she could be  personally, fully or partially, financially responsible for it. Ms. Eernisse expressed understanding and agreed to proceed.   Historic Elements   Brittney Simpson is a 89 y.o. year old, female patient evaluated today after our last contact on 08/27/2022. Brittney Simpson  has a past medical history of Anxiety, Arthritis, Back ache, Hiatal hernia, Hyperlipidemia, Hypertension, Hypothyroid, Osteopenia, Thyroid disease, and UTI (urinary tract infection). She also  has a past surgical history that includes Hernia repair; Hernia repair; Hemorrhoid surgery; Kyphoplasty (N/A, 01/23/2017); Kyphoplasty (N/A, 01/25/2020); and Cholecystectomy. [name] has a current medication list which includes the following prescription(s): acetaminophen, amlodipine, brimonidine, vitamin d3, clonazepam, cranberry, furosemide, latanoprost, levothyroxine, lisinopril, melatonin, metoprolol succinate, omeprazole, tamsulosin, tramadol, trazodone, and methocarbamol. She  reports that she has never smoked. She has never used smokeless tobacco. She reports that she does not drink alcohol and does not use drugs. Brittney Simpson has No Known Allergies.  Estimated body mass index is 20.37 kg/m as calculated from the following:   Height as of 08/27/22: 5' 3"$  (1.6 m).   Weight as of 08/27/22: 115 lb (52.2 kg).  HPI  Today, she is being contacted for a post-procedure assessment.  The patient again reported excellent benefits from the palliative thoracolumbar epidural steroid injection.  She indicates having an ongoing 80% benefit.  Today I spoke to the patient's daughter, 09/09/22, who indicated that she is doing much better.  She is walking around and being a lot more active without any complaints of pain.  She has requested that we enter a PRN order for these procedures since they work extremely well for her.  Post-procedure  evaluation   Type: Lumbar epidural steroid injection (LESI) (interlaminar)  #8  Laterality: Right   Level:  T12-L1 Level.  Imaging: Fluoroscopic guidance         Anesthesia: Local anesthesia (1-2% Lidocaine) Anxiolysis: None                 Sedation: No Sedation                       DOS: 08/27/2022  Performed by: Gaspar Cola, MD  Purpose: Diagnostic/Therapeutic Indications: Lumbar radicular pain of intraspinal etiology of more than 4 weeks that has failed to respond to conservative therapy and is severe enough to impact quality of life or function. 1. Chronic low back pain (Right) w/ radicular pain (Right)   2. Chronic lumbar radicular pain (Right)   3. T12 compression fracture, sequela   4. DDD (degenerative disc disease), thoracolumbar   5. Lumbar foraminal stenosis   6. Lumbar lateral recess stenosis   7. Spinal stenosis of lumbar region with neurogenic claudication   8. Spondylolisthesis of lumbosacral region (L2-3 and L5-S1)   9. Abnormal MRI, lumbar spine (01/10/2017)    NAS-11 Pain score:   Pre-procedure: 9 /10   Post-procedure: 9 /10      Effectiveness:  Initial hour after procedure: 100 %. Subsequent 4-6 hours post-procedure: 100 %. Analgesia past initial 6 hours: 80 %. Ongoing improvement:  Analgesic: The patient indicates having attained an ongoing 80% improvement of her low back pain. Function: Brittney Simpson reports improvement in function ROM: Brittney Simpson reports improvement in ROM  Pharmacotherapy Assessment   Opioid Analgesic: Tramadol 50 mg, 1 tab PO q 8 hrs (150 mg/day of tramadol) MME/day: 15 mg/day.   Monitoring: [place] PMP: PDMP reviewed during this encounter.       Pharmacotherapy: No side-effects or adverse reactions reported. Compliance: No problems identified. Effectiveness: Clinically acceptable. Plan: Refer to "POC". UDS:  Summary  Date Value Ref Range Status  04/10/2022 Note  Final    Comment:     ==================================================================== [employer] Select 13 (MW) ==================================================================== Specimen Alert Note: Urinary creatinine is low; ability to detect some drugs may be compromised. Interpret results with caution. (Creatinine) ==================================================================== Test                             Result       Flag       Units  Drug Present and Declared for Prescription Verification   7-aminoclonazepam              310          EXPECTED   ng/mg creat    7-aminoclonazepam is an expected metabolite of clonazepam. Source of    clonazepam is a scheduled prescription medication.    Tramadol                       >50000       EXPECTED   ng/mg creat   O-Desmethyltramadol            30510        EXPECTED   ng/mg creat   N-Desmethyltramadol            7260         EXPECTED   ng/mg creat    Source of tramadol is a prescription medication. O-desmethyltramadol    and N-desmethyltramadol are expected metabolites of tramadol.  ==================================================================== Test  Result    Flag   Units      Ref Range   Creatinine              10        LL     mg/dL      >=20 ==================================================================== Declared Medications:  The flagging and interpretation on this report are based on the  following declared medications.  Unexpected results may arise from  inaccuracies in the declared medications.   **Note: The testing scope of this panel includes these medications:   Clonazepam (Klonopin)  Tramadol (Ultram)   **Note: The testing scope of this panel does not include the  following reported medications:   Acetaminophen (Tylenol)  Amlodipine (Norvasc)  Amoxicillin (Amoxil)  Brimonidine (Alphagan)  Cranberry  Eye Drop  Furosemide (Lasix)  Levothyroxine (Synthroid)  Lisinopril (Zestril)  Melatonin   Methocarbamol (Robaxin)  Metoprolol (Toprol)  Omeprazole (Prilosec)  Sulfamethoxazole (Bactrim)  Tamsulosin (Flomax)  Trazodone (Desyrel)  Trimethoprim (Bactrim)  Vitamin D3 ==================================================================== For clinical consultation, please call [phone]. ====================================================================    No results found for: "CBDTHCR", "D8THCCBX", "D9THCCBX"   Laboratory Chemistry Profile   Renal Lab Results  Component Value Date   BUN 13 06/28/2022   CREATININE 1.01 (H) 06/28/2022   BCR 13 06/28/2022   GFRAA 76 05/16/2020   GFRNONAA 66 05/16/2020    Hepatic Lab Results  Component Value Date   AST 9 (L) 06/28/2022   ALT 7 06/28/2022   ALBUMIN 3.6 11/22/2016   ALKPHOS 59 11/22/2016   LIPASE 21 06/14/2016    Electrolytes Lab Results  Component Value Date   NA 137 06/28/2022   K 4.2 06/28/2022   CL 100 06/28/2022   CALCIUM 9.3 06/28/2022   MG 2.1 01/14/2014    Bone No results found for: "VD25OH", "VD125OH2TOT", "[id number]", "UK:060616", "25OHVITD1", "25OHVITD2", "25OHVITD3", "TESTOFREE", "TESTOSTERONE"  Inflammation (CRP: Acute Phase) (ESR: Chronic Phase) No results found for: "CRP", "ESRSEDRATE", "LATICACIDVEN"       Note: Above Lab results reviewed.  Imaging  DG PAIN CLINIC C-ARM 1-60 MIN NO REPORT Fluoro was used, but no Radiologist interpretation will be provided.  Please refer to "NOTES" tab for provider progress note.  Assessment  The primary encounter diagnosis was Chronic low back pain (Right) w/ radicular pain (Right). Diagnoses of Chronic lumbar radicular pain (Right), T12 compression fracture, sequela, DDD (degenerative disc disease), thoracolumbar, Lumbar foraminal stenosis, Lumbar lateral recess stenosis, Spinal stenosis of lumbar region with neurogenic claudication, Spondylolisthesis of lumbosacral region (L2-3 and L5-S1), and Abnormal MRI, lumbar spine (01/10/2017) were also pertinent  to this visit.  Plan of Care  Problem-specific:  No problem-specific Assessment & Plan notes found for this encounter.  Ms. DEOVEON SPINOLA has a current medication list which includes the following long-term medication(s): amlodipine, clonazepam, furosemide, levothyroxine, lisinopril, metoprolol succinate, omeprazole, tramadol, trazodone, and methocarbamol.  Pharmacotherapy (Medications Ordered): No orders of the defined types were placed in this encounter.  Orders:  Orders Placed This Encounter  Procedures   Lumbar Epidural Injection    Standing Status:   Standing    Number of Occurrences:   6    Standing Expiration Date:   09/13/2023    Scheduling Instructions:     Procedure: Interlaminar Lumbar Epidural Steroid injection (LESI)  T12-L1     Laterality: Right-sided     Sedation: None required.     Timeframe: PRN    Order Specific Question:   Where will this procedure be performed?    Answer:  [facility]   Follow-up plan:   Return if symptoms worsen, for (Clinic): (R) T12-L1 LESI, (PRN).      Interventional Therapies  Risk  Complexity Considerations:   Estimated body mass index is 20.19 kg/m as calculated from the following:   Height as of this encounter: 5' 3"$  (1.6 m).   Weight as of this encounter: 114 lb (51.7 kg). Note: Hard of hearing   Planned  Pending:      Under consideration:   Diagnostic right hip joint injection  Diagnostic right trochanteric bursa injection  Diagnostic midline caudal ESI (for the tailbone pain)   Completed:   Palliative right T12-L1 LESI x6 (01/22/2022) (100/100/80/80)  Therapeutic right L2-3 LESI x1 (03/07/2020) (100/100/25/<50)  Diagnostic/therapeutic bilateral SI joint injection x1 (02/10/2020) (100/100/100 x2 days/0)  Palliative right lumbar facet MBB x13 (01/04/2020) (8 to 0) (100/100/100)  Palliative left lumbar facet MBB x11 (01/04/2020) (8 to 0) (100/100/100)    Therapeutic  Palliative (PRN) options:   Palliative  right T12-L1 LESI (PRN)       Recent Visits Date Type Provider Dept  08/27/22 Procedure visit Milinda Pointer, MD [facility]  Showing recent visits within past 90 days and meeting all other requirements Today's Visits Date Type Provider Dept  09/12/22 Office Visit Milinda Pointer, MD [facility]  Showing today's visits and meeting all other requirements Future Appointments No visits were found meeting these conditions. Showing future appointments within next 90 days and meeting all other requirements  I discussed the assessment and treatment plan with the patient. The patient was provided an opportunity to ask questions and all were answered. The patient agreed with the plan and demonstrated an understanding of the instructions.  Patient advised to call back or seek an in-person evaluation if the symptoms or condition worsens.  Duration of encounter: 12 minutes.  Note by: Gaspar Cola, MD Date: 09/12/2022; Time: 11:54 AM

## 2022-10-09 ENCOUNTER — Other Ambulatory Visit: Payer: Self-pay | Admitting: Pain Medicine

## 2022-10-09 ENCOUNTER — Telehealth: Payer: Self-pay | Admitting: Pain Medicine

## 2022-10-09 DIAGNOSIS — M4854XS Collapsed vertebra, not elsewhere classified, thoracic region, sequela of fracture: Secondary | ICD-10-CM

## 2022-10-09 DIAGNOSIS — M5137 Other intervertebral disc degeneration, lumbosacral region: Secondary | ICD-10-CM

## 2022-10-09 DIAGNOSIS — Z79891 Long term (current) use of opiate analgesic: Secondary | ICD-10-CM

## 2022-10-09 DIAGNOSIS — G8929 Other chronic pain: Secondary | ICD-10-CM

## 2022-10-09 DIAGNOSIS — M5135 Other intervertebral disc degeneration, thoracolumbar region: Secondary | ICD-10-CM

## 2022-10-09 DIAGNOSIS — Z79899 Other long term (current) drug therapy: Secondary | ICD-10-CM

## 2022-10-09 DIAGNOSIS — M47816 Spondylosis without myelopathy or radiculopathy, lumbar region: Secondary | ICD-10-CM

## 2022-10-09 DIAGNOSIS — G894 Chronic pain syndrome: Secondary | ICD-10-CM

## 2022-10-09 DIAGNOSIS — S22080S Wedge compression fracture of T11-T12 vertebra, sequela: Secondary | ICD-10-CM

## 2022-10-09 NOTE — Telephone Encounter (Signed)
Daughter called stated that she called pharmacy and was told that her mother didn't have another refill on the Tramadol to be filled. PT daughter stated that her mother will be out and her appt isn't until next Wednesday. Daughter wanted to see if Dr. Dossie Arbour will call in some to last her until her appt. Please give daughter a call. TY

## 2022-10-10 ENCOUNTER — Ambulatory Visit: Payer: Medicare Other | Attending: Pain Medicine | Admitting: Pain Medicine

## 2022-10-10 ENCOUNTER — Encounter: Payer: Self-pay | Admitting: Pain Medicine

## 2022-10-10 VITALS — BP 140/72 | HR 78 | Temp 97.2°F | Ht 63.0 in | Wt 115.0 lb

## 2022-10-10 DIAGNOSIS — M4854XS Collapsed vertebra, not elsewhere classified, thoracic region, sequela of fracture: Secondary | ICD-10-CM | POA: Insufficient documentation

## 2022-10-10 DIAGNOSIS — M5135 Other intervertebral disc degeneration, thoracolumbar region: Secondary | ICD-10-CM | POA: Diagnosis not present

## 2022-10-10 DIAGNOSIS — G8929 Other chronic pain: Secondary | ICD-10-CM | POA: Insufficient documentation

## 2022-10-10 DIAGNOSIS — M533 Sacrococcygeal disorders, not elsewhere classified: Secondary | ICD-10-CM | POA: Diagnosis not present

## 2022-10-10 DIAGNOSIS — G894 Chronic pain syndrome: Secondary | ICD-10-CM | POA: Insufficient documentation

## 2022-10-10 DIAGNOSIS — Z79891 Long term (current) use of opiate analgesic: Secondary | ICD-10-CM | POA: Insufficient documentation

## 2022-10-10 DIAGNOSIS — M25551 Pain in right hip: Secondary | ICD-10-CM | POA: Diagnosis not present

## 2022-10-10 DIAGNOSIS — M545 Low back pain, unspecified: Secondary | ICD-10-CM | POA: Insufficient documentation

## 2022-10-10 DIAGNOSIS — M5137 Other intervertebral disc degeneration, lumbosacral region: Secondary | ICD-10-CM | POA: Insufficient documentation

## 2022-10-10 DIAGNOSIS — Z79899 Other long term (current) drug therapy: Secondary | ICD-10-CM | POA: Diagnosis not present

## 2022-10-10 DIAGNOSIS — S22080S Wedge compression fracture of T11-T12 vertebra, sequela: Secondary | ICD-10-CM | POA: Diagnosis not present

## 2022-10-10 DIAGNOSIS — M79604 Pain in right leg: Secondary | ICD-10-CM | POA: Diagnosis not present

## 2022-10-10 DIAGNOSIS — M47816 Spondylosis without myelopathy or radiculopathy, lumbar region: Secondary | ICD-10-CM | POA: Insufficient documentation

## 2022-10-10 MED ORDER — TRAMADOL HCL 50 MG PO TABS: 50.0000 mg | ORAL_TABLET | Freq: Three times a day (TID) | ORAL | 5 refills | Status: DC

## 2022-10-10 NOTE — Progress Notes (Signed)
PROVIDER NOTE: Information contained herein reflects review and annotations entered in association with encounter. Interpretation of such information and data should be left to medically-trained personnel. Information provided to patient can be located elsewhere in the medical record under "Patient Instructions". Document created using STT-dictation technology, any transcriptional errors that may result from process are unintentional.    Patient: Brittney Simpson  Service Category: E/M  Provider: Gaspar Cola, MD  DOB: Jun 20, 1928  DOS: 10/10/2022  Referring Provider: Nobie Putnam *  MRN: [id number]  Specialty: Interventional Pain Management  PCP: Olin Hauser, DO  Type: Established Patient  Setting: Ambulatory outpatient    Location: Office  Delivery: Face-to-face     HPI  Brittney Simpson, a 89 y.o. year old female, is here today because of her Chronic pain syndrome [G89.4]. Brittney Simpson primary complain today is Back Pain (lower)  Pertinent problems: Brittney Simpson has Lumbar facet syndrome (Bilateral) (R>L); Chronic low back pain (1ry area of Pain) (Bilateral) (R>L) w/o sciatica; Spondylolisthesis of lumbosacral region (L2-3 and L5-S1); Lumbar spinal stenosis (9 mm at L3-4); Trochanteric bursitis (Right); Chronic hip pain (2ry area of Pain) (Right); Osteoarthritis of hip (Right); Chronic sacroiliac joint pain (Bilateral) (R>L); Chronic lumbar radicular pain (Right); Chronic lower extremity pain (3ry area of Pain) (Right); Myofascial pain; Spondylosis without myelopathy or radiculopathy, lumbosacral region; Ankle edema, bilateral; Abnormal MRI, lumbar spine (01/10/2017); T12 compression fracture, sequela; Lumbar facet hypertrophy; Lumbar lateral recess stenosis; Lumbar foraminal stenosis; Non-traumatic compression fracture of T7 thoracic vertebra, sequela; Chronic pain syndrome; DDD (degenerative disc disease), lumbosacral; Edema of lower extremity; Somatic dysfunction  of sacroiliac joints (Bilateral); Other spondylosis, sacral and sacrococcygeal region; Spasm of back muscles; DDD (degenerative disc disease), thoracolumbar; and Chronic low back pain (Right) w/ radicular pain (Right) on their pertinent problem list. Pain Assessment: Severity of Chronic pain is reported as a 1 /10. Location: Back Left, Right/Denies. Onset: More than a month ago. Quality: Aching. Timing: Constant. Modifying factor(s): Meds and laying down. Vitals:  height is '5\' 3"'$  (1.6 m) and weight is 115 lb (52.2 kg). Her temperature is 97.2 F (36.2 C) (abnormal). Her blood pressure is 140/72 (abnormal) and her pulse is 78. Her oxygen saturation is 99%.  BMI: Estimated body mass index is 20.37 kg/m as calculated from the following:   Height as of this encounter: '5\' 3"'$  (1.6 m).   Weight as of this encounter: 115 lb (52.2 kg). Last encounter: 09/12/2022. Last procedure: 08/27/2022.  Reason for encounter: medication management.  The patient indicates doing well with the current medication regimen. No adverse reactions or side effects reported to the medications.   RTCB: 04/12/2023    Pharmacotherapy Assessment  Analgesic: Tramadol 50 mg, 1 tab PO q 8 hrs (150 mg/day of tramadol) MME/day: 15 mg/day.   Monitoring: [place] PMP: PDMP reviewed during this encounter.       Pharmacotherapy: No side-effects or adverse reactions reported. Compliance: No problems identified. Effectiveness: Clinically acceptable.  Chauncey Fischer, RN  10/10/2022  1:18 PM  Sign when Signing Visit Nursing Pain Medication Assessment:  Safety precautions to be maintained throughout the outpatient stay will include: orient to surroundings, keep bed in low position, maintain call bell within reach at all times, provide assistance with transfer out of bed and ambulation.  Medication Inspection Compliance: Pill count conducted under aseptic conditions, in front of the patient. Neither the pills nor the bottle was removed from the  patient's sight at any time. Once count was completed pills were  immediately returned to the patient in their original bottle.  Medication: Tramadol (Ultram) Pill/Patch Count:  13 of 90 pills remain Pill/Patch Appearance: Markings consistent with prescribed medication Bottle Appearance: Standard pharmacy container. Clearly labeled. Filled Date: 2 / 7/ 2024 Last Medication intake:  TodaySafety precautions to be maintained throughout the outpatient stay will include: orient to surroundings, keep bed in low position, maintain call bell within reach at all times, provide assistance with transfer out of bed and ambulation.     No results found for: "CBDTHCR" No results found for: "D8THCCBX" No results found for: "D9THCCBX"  UDS:  Summary  Date Value Ref Range Status  04/10/2022 Note  Final    Comment:    ==================================================================== [employer] Select 13 (MW) ==================================================================== Specimen Alert Note: Urinary creatinine is low; ability to detect some drugs may be compromised. Interpret results with caution. (Creatinine) ==================================================================== Test                             Result       Flag       Units  Drug Present and Declared for Prescription Verification   7-aminoclonazepam              310          EXPECTED   ng/mg creat    7-aminoclonazepam is an expected metabolite of clonazepam. Source of    clonazepam is a scheduled prescription medication.    Tramadol                       >50000       EXPECTED   ng/mg creat   O-Desmethyltramadol            30510        EXPECTED   ng/mg creat   N-Desmethyltramadol            7260         EXPECTED   ng/mg creat    Source of tramadol is a prescription medication. O-desmethyltramadol    and N-desmethyltramadol are expected metabolites of  tramadol.  ==================================================================== Test                      Result    Flag   Units      Ref Range   Creatinine              10        LL     mg/dL      >=20 ==================================================================== Declared Medications:  The flagging and interpretation on this report are based on the  following declared medications.  Unexpected results may arise from  inaccuracies in the declared medications.   **Note: The testing scope of this panel includes these medications:   Clonazepam (Klonopin)  Tramadol (Ultram)   **Note: The testing scope of this panel does not include the  following reported medications:   Acetaminophen (Tylenol)  Amlodipine (Norvasc)  Amoxicillin (Amoxil)  Brimonidine (Alphagan)  Cranberry  Eye Drop  Furosemide (Lasix)  Levothyroxine (Synthroid)  Lisinopril (Zestril)  Melatonin  Methocarbamol (Robaxin)  Metoprolol (Toprol)  Omeprazole (Prilosec)  Sulfamethoxazole (Bactrim)  Tamsulosin (Flomax)  Trazodone (Desyrel)  Trimethoprim (Bactrim)  Vitamin D3 ==================================================================== For clinical consultation, please call [phone]. ====================================================================       ROS  Constitutional: Denies any fever or chills Gastrointestinal: No reported hemesis, hematochezia, vomiting, or acute GI distress Musculoskeletal: Denies any acute  onset joint swelling, redness, loss of ROM, or weakness Neurological: No reported episodes of acute onset apraxia, aphasia, dysarthria, agnosia, amnesia, paralysis, loss of coordination, or loss of consciousness  Medication Review  Cholecalciferol, Cranberry, Melatonin, acetaminophen, amLODipine, brimonidine, clonazePAM, furosemide, latanoprost, levothyroxine, lisinopril, methocarbamol, metoprolol succinate, omeprazole, tamsulosin, traMADol, and traZODone  History Review   Allergy: Brittney Simpson has No Known Allergies. Drug: Brittney Simpson  reports no history of drug use. Alcohol:  reports no history of alcohol use. Tobacco:  reports that she has never smoked. She has never used smokeless tobacco. Social: Brittney Simpson  reports that she has never smoked. She has never used smokeless tobacco. She reports that she does not drink alcohol and does not use drugs. Medical:  has a past medical history of Anxiety, Arthritis, Back ache, Hiatal hernia, Hyperlipidemia, Hypertension, Hypothyroid, Osteopenia, Thyroid disease, and UTI (urinary tract infection). Surgical: Brittney Simpson  has a past surgical history that includes Hernia repair; Hernia repair; Hemorrhoid surgery; Kyphoplasty (N/A, 01/23/2017); Kyphoplasty (N/A, 01/25/2020); and Cholecystectomy. Family: family history includes Cancer in her father; Stroke in her mother.  Laboratory Chemistry Profile   Renal Lab Results  Component Value Date   BUN 13 06/28/2022   CREATININE 1.01 (H) 06/28/2022   BCR 13 06/28/2022   GFRAA 76 05/16/2020   GFRNONAA 66 05/16/2020    Hepatic Lab Results  Component Value Date   AST 9 (L) 06/28/2022   ALT 7 06/28/2022   ALBUMIN 3.6 11/22/2016   ALKPHOS 59 11/22/2016   LIPASE 21 06/14/2016    Electrolytes Lab Results  Component Value Date   NA 137 06/28/2022   K 4.2 06/28/2022   CL 100 06/28/2022   CALCIUM 9.3 06/28/2022   MG 2.1 01/14/2014    Bone No results found for: "VD25OH", "VD125OH2TOT", "[id number]", "UK:[id number]", "25OHVITD1", "25OHVITD2", "25OHVITD3", "TESTOFREE", "TESTOSTERONE"  Inflammation (CRP: Acute Phase) (ESR: Chronic Phase) No results found for: "CRP", "ESRSEDRATE", "LATICACIDVEN"       Note: Above Lab results reviewed.  Recent Imaging Review  DG PAIN CLINIC C-ARM 1-60 MIN NO REPORT Fluoro was used, but no Radiologist interpretation will be provided.  Please refer to "NOTES" tab for provider progress note. Note: Reviewed        Physical Exam  General  appearance: Well nourished, well developed, and well hydrated. In no apparent acute distress Mental status: Alert, oriented x 3 (person, place, & time)       Respiratory: No evidence of acute respiratory distress Eyes: PERLA Vitals: BP (!) 140/72   Pulse 78   Temp (!) 97.2 F (36.2 C)   Ht '5\' 3"'$  (1.6 m)   Wt 115 lb (52.2 kg)   SpO2 99%   BMI 20.37 kg/m  BMI: Estimated body mass index is 20.37 kg/m as calculated from the following:   Height as of this encounter: '5\' 3"'$  (1.6 m).   Weight as of this encounter: 115 lb (52.2 kg). Ideal: Ideal body weight: 52.4 kg (115 lb 8.3 oz)  Assessment   Diagnosis Status  1. Chronic pain syndrome   2. Chronic low back pain (1ry area of Pain) (Bilateral) (R>L) w/o sciatica   3. Chronic hip pain (2ry area of Pain) (Right)   4. Chronic lower extremity pain (3ry area of Pain) (Right)   5. DDD (degenerative disc disease), thoracolumbar   6. DDD (degenerative disc disease), lumbosacral   7. Chronic sacroiliac joint pain (Bilateral) (R>L)   8. Lumbar facet syndrome (Bilateral) (R>L)   9. T12 compression fracture, sequela  10. Non-traumatic compression fracture of T7 thoracic vertebra, sequela   11. Pharmacologic therapy   12. Chronic use of opiate for therapeutic purpose   13. Encounter for medication management   14. Encounter for chronic pain management    Controlled Controlled Controlled   Updated Problems: No problems updated.  Plan of Care  Problem-specific:  No problem-specific Assessment & Plan notes found for this encounter.  Brittney Simpson has a current medication list which includes the following long-term medication(s): amlodipine, clonazepam, furosemide, levothyroxine, lisinopril, metoprolol succinate, omeprazole, [START ON 10/14/2022] tramadol, trazodone, and methocarbamol.  Pharmacotherapy (Medications Ordered): Meds ordered this encounter  Medications   traMADol (ULTRAM) 50 MG tablet    Sig: Take 1 tablet (50 mg  total) by mouth 3 (three) times daily. Each refill must last 30 days.    Dispense:  90 tablet    Refill:  5    DO NOT: delete (not duplicate); no partial-fill (will deny script to complete), no renewal request (F/U required). DISPENSE: 1 day early if closed on fill date. WARN: No CNS-depressants within 8 hrs of med.   Orders:  No orders of the defined types were placed in this encounter.  Follow-up plan:   Return in about 6 months (around 04/12/2023) for Eval-day (M,W), (F2F), (MM).      Interventional Therapies  Risk  Complexity Considerations:   Estimated body mass index is 20.19 kg/m as calculated from the following:   Height as of this encounter: '5\' 3"'$  (1.6 m).   Weight as of this encounter: 114 lb (51.7 kg). Note: Hard of hearing   Planned  Pending:      Under consideration:   Diagnostic right hip joint injection  Diagnostic right trochanteric bursa injection  Diagnostic midline caudal ESI (for the tailbone pain)   Completed:   Palliative right T12-L1 LESI x6 (01/22/2022) (100/100/80/80)  Therapeutic right L2-3 LESI x1 (03/07/2020) (100/100/25/<50)  Diagnostic/therapeutic bilateral SI joint injection x1 (02/10/2020) (100/100/100 x2 days/0)  Palliative right lumbar facet MBB x13 (01/04/2020) (8 to 0) (100/100/100)  Palliative left lumbar facet MBB x11 (01/04/2020) (8 to 0) (100/100/100)    Therapeutic  Palliative (PRN) options:   Palliative right T12-L1 LESI (PRN)   Pharmacotherapy  Nonopioids transferred 04/11/2020: Robaxin       Recent Visits Date Type Provider Dept  09/12/22 Office Visit Milinda Pointer, MD [facility]  08/27/22 Procedure visit Milinda Pointer, MD [facility]  Showing recent visits within past 90 days and meeting all other requirements Today's Visits Date Type Provider Dept  10/10/22 Office Visit Milinda Pointer, MD [facility]  Showing today's visits and meeting all other requirements Future  Appointments No visits were found meeting these conditions. Showing future appointments within next 90 days and meeting all other requirements  I discussed the assessment and treatment plan with the patient. The patient was provided an opportunity to ask questions and all were answered. The patient agreed with the plan and demonstrated an understanding of the instructions.  Patient advised to call back or seek an in-person evaluation if the symptoms or condition worsens.  Duration of encounter: 30 minutes.  Total time on encounter, as per AMA guidelines included both the face-to-face and non-face-to-face time personally spent by the physician and/or other qualified health care professional(s) on the day of the encounter (includes time in activities that require the physician or other qualified health care professional and does not include time in activities normally performed by clinical staff). Physician's time may  include the following activities when performed: Preparing to see the patient (e.g., pre-charting review of records, searching for previously ordered imaging, lab work, and nerve conduction tests) Review of prior analgesic pharmacotherapies. Reviewing PMP Interpreting ordered tests (e.g., lab work, imaging, nerve conduction tests) Performing post-procedure evaluations, including interpretation of diagnostic procedures Obtaining and/or reviewing separately obtained history Performing a medically appropriate examination and/or evaluation Counseling and educating the patient/family/caregiver Ordering medications, tests, or procedures Referring and communicating with other health care professionals (when not separately reported) Documenting clinical information in the electronic or other health record Independently interpreting results (not separately reported) and communicating results to the patient/ family/caregiver Care coordination (not separately reported)  Note by: Gaspar Cola, MD Date: 10/10/2022; Time: 1:18 PM

## 2022-10-10 NOTE — Patient Instructions (Signed)
____________________________________________________________________________________________  Opioid Pain Medication Update  To: All patients taking opioid pain medications. (I.e.: hydrocodone, hydromorphone, oxycodone, oxymorphone, morphine, codeine, methadone, tapentadol, tramadol, buprenorphine, fentanyl, etc.)  Re: Updated review of side effects and adverse reactions of opioid analgesics, as well as new information about long term effects of this class of medications.  Direct risks of long-term opioid therapy are not limited to opioid addiction and overdose. Potential medical risks include serious fractures, breathing problems during sleep, hyperalgesia, immunosuppression, chronic constipation, bowel obstruction, myocardial infarction, and tooth decay secondary to xerostomia.  Unpredictable adverse effects that can occur even if you take your medication correctly: Cognitive impairment, respiratory depression, and death. Most people think that if they take their medication "correctly", and "as instructed", that they will be safe. Nothing could be farther from the truth. In reality, a significant amount of recorded deaths associated with the use of opioids has occurred in individuals that had taken the medication for a long time, and were taking their medication correctly. The following are examples of how this can happen: Patient taking his/her medication for a long time, as instructed, without any side effects, is given a certain antibiotic or another unrelated medication, which in turn triggers a "Drug-to-drug interaction" leading to disorientation, cognitive impairment, impaired reflexes, respiratory depression or an untoward event leading to serious bodily harm or injury, including death.  Patient taking his/her medication for a long time, as instructed, without any side effects, develops an acute impairment of liver and/or kidney function. This will lead to a rapid inability of the body to  breakdown and eliminate their pain medication, which will result in effects similar to an "overdose", but with the same medicine and dose that they had always taken. This again may lead to disorientation, cognitive impairment, impaired reflexes, respiratory depression or an untoward event leading to serious bodily harm or injury, including death.  A similar problem will occur with patients as they grow older and their liver and kidney function begins to decrease as part of the aging process.  Background information: Historically, the original case for using long-term opioid therapy to treat chronic noncancer pain was based on safety assumptions that subsequent experience has called into question. In 1996, the American Pain Society and the [place] Academy [employer] issued a consensus statement supporting long-term opioid therapy. This statement acknowledged the dangers of opioid prescribing but concluded that the risk for addiction was low; respiratory depression induced by opioids was short-lived, occurred mainly in opioid-naive patients, and was antagonized by pain; tolerance was not a common problem; and efforts to control diversion should not constrain opioid prescribing. This has now proven to be wrong. Experience regarding the risks for opioid addiction, misuse, and overdose in community practice has failed to support these assumptions.  According to the Centers for Disease Control and Prevention, fatal overdoses involving opioid analgesics have increased sharply over the past decade. Currently, more than 96,700 people die from drug overdoses every year. Opioids are a factor in 7 out of every 10 overdose deaths. Deaths from drug overdose have surpassed motor vehicle accidents as the leading cause of death for individuals between the ages of 1 and 15.  Clinical data suggest that neuroendocrine dysfunction may be very common in both men and women, potentially causing hypogonadism, erectile  dysfunction, infertility, decreased libido, osteoporosis, and depression. Recent studies linked higher opioid dose to increased opioid-related mortality. Controlled observational studies reported that long-term opioid therapy may be associated with increased risk for cardiovascular events. Subsequent meta-analysis concluded  that the safety of long-term opioid therapy in elderly patients has not been proven.   Side Effects and adverse reactions: Common side effects: Drowsiness (sedation). Dizziness. Nausea and vomiting. Constipation. Physical dependence -- Dependence often manifests with withdrawal symptoms when opioids are discontinued or decreased. Tolerance -- As you take repeated doses of opioids, you require increased medication to experience the same effect of pain relief. Respiratory depression -- This can occur in healthy people, especially with higher doses. However, people with COPD, asthma or other lung conditions may be even more susceptible to fatal respiratory impairment.  Uncommon side effects: An increased sensitivity to feeling pain and extreme response to pain (hyperalgesia). Chronic use of opioids can lead to this. Delayed gastric emptying (the process by which the contents of your stomach are moved into your small intestine). Muscle rigidity. Immune system and hormonal dysfunction. Quick, involuntary muscle jerks (myoclonus). Arrhythmia. Itchy skin (pruritus). Dry mouth (xerostomia).  Long-term side effects: Chronic constipation. Sleep-disordered breathing (SDB). Increased risk of bone fractures. Hypothalamic-pituitary-adrenal dysregulation. Increased risk of overdose.  RISKS: Fractures and Falls:  Opioids increase the risk and incidence of falls. This is of particular importance in elderly patients.  Endocrine System:  Long-term administration is associated with endocrine abnormalities (endocrinopathies). (Also known as Opioid-induced Endocrinopathy) Influences  on both the hypothalamic-pituitary-adrenal axis?and the hypothalamic-pituitary-gonadal axis have been demonstrated with consequent hypogonadism and adrenal insufficiency in both sexes. Hypogonadism and decreased levels of dehydroepiandrosterone sulfate have been reported in men and women. Endocrine effects include: Amenorrhoea in women (abnormal absence of menstruation) Reduced libido in both sexes Decreased sexual function Erectile dysfunction in men Hypogonadisms (decreased testicular function with shrinkage of testicles) Infertility Depression and fatigue Loss of muscle mass Anxiety Depression Immune suppression Hyperalgesia Weight gain Anemia Osteoporosis Patients (particularly women of childbearing age) should avoid opioids. There is insufficient evidence to recommend routine monitoring of asymptomatic patients taking opioids in the long-term for hormonal deficiencies.  Immune System: Human studies have demonstrated that opioids have an immunomodulating effect. These effects are mediated via opioid receptors both on immune effector cells and in the central nervous system. Opioids have been demonstrated to have adverse effects on antimicrobial response and anti-tumour surveillance. Buprenorphine has been demonstrated to have no impact on immune function.  Opioid Induced Hyperalgesia: Human studies have demonstrated that prolonged use of opioids can lead to a state of abnormal pain sensitivity, sometimes called opioid induced hyperalgesia (OIH). Opioid induced hyperalgesia is not usually seen in the absence of tolerance to opioid analgesia. Clinically, hyperalgesia may be diagnosed if the patient on long-term opioid therapy presents with increased pain. This might be qualitatively and anatomically distinct from pain related to disease progression or to breakthrough pain resulting from development of opioid tolerance. Pain associated with hyperalgesia tends to be more diffuse than the  pre-existing pain and less defined in quality. Management of opioid induced hyperalgesia requires opioid dose reduction.  Cancer: Chronic opioid therapy has been associated with an increased risk of cancer among noncancer patients with chronic pain. This association was more evident in chronic strong opioid users. Chronic opioid consumption causes significant pathological changes in the small intestine and colon. Epidemiological studies have found that there is a link between opium dependence and initiation of gastrointestinal cancers. Cancer is the second leading cause of death after cardiovascular disease. Chronic use of opioids can cause multiple conditions such as GERD, immunosuppression and renal damage as well as carcinogenic effects, which are associated with the incidence of cancers.   Mortality: Long-term opioid use  has been associated with increased mortality among patients with chronic non-cancer pain (CNCP).  Prescription of long-acting opioids for chronic noncancer pain was associated with a significantly increased risk of all-cause mortality, including deaths from causes other than overdose.  Reference: Von Korff M, Kolodny A, Deyo RA, Chou R. Long-term opioid therapy reconsidered. Ann Intern Med. 2011 Sep 6;155(5):325-8. doi: 10.7326/[id number]. PMID: [id number]; PMCIDLD:501236. Morley Kos, Hayward RA, Dunn KM, [place] KP. Risk of adverse events in patients prescribed long-term opioids: A cohort study in the [place] Clinical Practice Research Datalink. Eur J Pain. 2019 May;23(5):908-922. doi: 10.1002/ejp.1357. Epub 2019 Jan 31. PMID: [id number]. Colameco S, Coren JS, Ciervo CA. Continuous opioid treatment for chronic noncancer pain: a time for moderation in prescribing. Postgrad Med. 2009 Jul;121(4):61-6. doi: 10.3810/pgm.2009.07.2032. PMID: [id number]. Heywood Bene RN, [place] SD, Blazina I, Rosalio Loud, Bougatsos C, Deyo RA. The  effectiveness and risks of long-term opioid therapy for chronic pain: a systematic review for a [employer] of Health Pathways to [employer]. Ann Intern Med. 2015 Feb 17;162(4):276-86. doi: [id number]. PMID: [id number]. Marjory Sneddon [facility], [place] DM. NCHS Data Brief No. 22. [place]: Centers for Disease Control and Prevention; 2009. Sep, Increase in Fatal Poisonings Involving Opioid Analgesics in the [place], 1999-2006. Song IA, Choi HR, Oh TK. Long-term opioid use and mortality in patients with chronic non-cancer pain: Ten-year follow-up study in [place] from 2010 through 2019. EClinicalMedicine. 2022 Jul 18;51:101558. doi: 10.1016/j.eclinm.2022.YY:5197838. PMID: [id number]; PMCIDYT:2540545. Huser, W., Schubert, T., Vogelmann, T. et al. All-cause mortality in patients with long-term opioid therapy compared with non-opioid analgesics for chronic non-cancer pain: a database study. Flora Med 18, 162 (2020). [email or web address] Rashidian H, Roxy Cedar, Malekzadeh R, Haghdoost AA. An Ecological Study of the Association between Opiate Use and Incidence of Cancers. Addict Health. 2016 Fall;8(4):252-260. PMID: [id number]; PMCIDYE:622990.  Our Goal: Our goal is to control your pain with means other than the use of opioid pain medications.  Our Recommendation: Talk to your physician about coming off of these medications. We can assist you with the tapering down and stopping these medicines. Based on the new information, even if you cannot completely stop the medication, a decrease in the dose may be associated with a lesser risk. Ask for other means of controlling the pain. Decrease or eliminate those factors that significantly contribute to your pain such as smoking, obesity, and a diet heavily tilted towards "inflammatory" nutrients.  Last Updated: 09/25/2022    ____________________________________________________________________________________________     ____________________________________________________________________________________________  National Pain Medication Shortage  The [place] is experiencing worsening drug shortages. These have had a negative widespread effect on patient care and treatment. Not expected to improve any time soon. Predicted to last past 2029.   Drug shortage list (generic names) Oxycodone IR Oxycodone/APAP Oxymorphone IR Hydromorphone Hydrocodone/APAP Morphine  Where is the problem?  Manufacturing and supply level.  Will this shortage affect you?  Only if you take any of the above pain medications.  How? You may be unable to fill your prescription.  Your pharmacist may offer a "partial fill" of your prescription. (Warning: Do not accept partial fills.) Prescriptions partially filled cannot be transferred to another pharmacy. Read our Medication Rules and Regulation. Depending on how much medicine you are dependent on, you may experience withdrawals when unable to get the medication.  Recommendations: Consider ending your dependence on opioid pain medications. Ask your pain specialist to assist you with the process. Consider switching to  a medication currently not in shortage, such as Buprenorphine. Talk to your pain specialist about this option. Consider decreasing your pain medication requirements by managing tolerance thru "Drug Holidays". This may help minimize withdrawals, should you run out of medicine. Control your pain thru the use of non-pharmacological interventional therapies.   Your prescriber: Prescribers cannot be blamed for shortages. Medication manufacturing and supply issues cannot be fixed by the prescriber.   NOTE: The prescriber is not responsible for supplying the medication, or solving supply issues. Work with your pharmacist to solve it. The patient is responsible for the decision  to take or continue taking the medication and for identifying and securing a legal supply source. By law, supplying the medication is the job and responsibility of the pharmacy. The prescriber is responsible for the evaluation, monitoring, and prescribing of these medications.   Prescribers will NOT: Re-issue prescriptions that have been partially filled. Re-issue prescriptions already sent to a pharmacy.  Re-send prescriptions to a different pharmacy because yours did not have your medication. Ask pharmacist to order more medicine or transfer the prescription to another pharmacy. (Read below.)  New 2023 regulation: "March 29, 2022 Revised Regulation Allows DEA-Registered Pharmacies to Transfer Electronic Prescriptions at a Patient's Request [place] Patients now have the ability to request their electronic prescription be transferred to another pharmacy without having to go back to their practitioner to initiate the request. This revised regulation went into effect on Monday, March 25, 2022.     At a patient's request, a DEA-registered retail pharmacy can now transfer an electronic prescription for a controlled substance (schedules II-V) to another DEA-registered retail pharmacy. Prior to this change, patients would have to go through their practitioner to cancel their prescription and have it re-issued to a different pharmacy. The process was taxing and time consuming for both patients and practitioners.    The Drug Enforcement Administration [facility]) published its intent to revise the process for transferring electronic prescriptions on June 16, 2020.  The final rule was published in the federal register on February 21, 2022 and went into effect 30 days later.  Under the final rule, a prescription can only be transferred once between pharmacies, and only if allowed under existing state or other applicable law. The prescription must remain in its  electronic form; may not be altered in any way; and the transfer must be communicated directly between two licensed pharmacists. It's important to note, any authorized refills transfer with the original prescription, which means the entire prescription will be filled at the same pharmacy".  Reference: [email or web address] [facility] website announcement)  [email or web address] ([place])   [employer] / Vol. 88, No. 143 / Thursday, February 21, 2022 / Rules and Regulations DEPARTMENT OF JUSTICE  Drug Enforcement Administration  21 CFR Part 1306  [Docket No. [id number]]  RIN [id number] Transfer of Electronic Prescriptions for Schedules II-V Controlled Substances Between Pharmacies for Initial Filling  ____________________________________________________________________________________________     ____________________________________________________________________________________________  Transfer of Pain Medication between Pharmacies  Re: 2023 DEA Clarification on existing regulation  Published on DEA Website: March 29, 2022  Title: Revised Regulation Allows DEA-Registered Pharmacies to Conservator, museum/gallery Prescriptions at a Patient's Request [place]  "Patients now have the ability to request their electronic prescription be transferred to another pharmacy without having to go back to their practitioner to initiate the request. This revised regulation went into effect on Monday, March 25, 2022.  At a patient's request, a DEA-registered retail pharmacy can now transfer an electronic prescription for a controlled substance (schedules II-V) to another DEA-registered retail pharmacy. Prior to this change, patients would have to go through their practitioner to cancel their  prescription and have it re-issued to a different pharmacy. The process was taxing and time consuming for both patients and practitioners.    The Drug Enforcement Administration [facility]) published its intent to revise the process for transferring electronic prescriptions on June 16, 2020.  The final rule was published in the federal register on February 21, 2022 and went into effect 30 days later.  Under the final rule, a prescription can only be transferred once between pharmacies, and only if allowed under existing state or other applicable law. The prescription must remain in its electronic form; may not be altered in any way; and the transfer must be communicated directly between two licensed pharmacists. It's important to note, any authorized refills transfer with the original prescription, which means the entire prescription will be filled at the same pharmacy."    REFERENCES: 1. DEA website announcement [email or web address]  2. Department of Justice website  [email or web address]  3. DEPARTMENT OF JUSTICE Drug Enforcement Administration 21 CFR Part 1306 [Docket No. [id number]] RIN [id number] "Transfer of Electronic Prescriptions for Schedules II-V Controlled Substances Between Pharmacies for Initial Filling"  ____________________________________________________________________________________________     _______________________________________________________________________  Medication Rules  Purpose: To inform patients, and their family members, of our medication rules and regulations.  Applies to: All patients receiving prescriptions from our practice (written or electronic).  Pharmacy of record: This is the pharmacy where your electronic prescriptions will be sent. Make sure we have the correct one.  Electronic prescriptions: In compliance  with the [place] (STOP) Act of 2017 (Session Lanny Cramp [phone]), effective July 29, 2018, all controlled substances must be electronically prescribed. Written prescriptions, faxing, or calling prescriptions to a pharmacy will no longer be done.  Prescription refills: These will be provided only during in-person appointments. No medications will be renewed without a "face-to-face" evaluation with your provider. Applies to all prescriptions.  NOTE: The following applies primarily to controlled substances (Opioid* Pain Medications).   Type of encounter (visit): For patients receiving controlled substances, face-to-face visits are required. (Not an option and not up to the patient.)  Patient's responsibilities: Pain Pills: Bring all pain pills to every appointment (except for procedure appointments). Pill Bottles: Bring pills in original pharmacy bottle. Bring bottle, even if empty. Always bring the bottle of the most recent fill.  Medication refills: You are responsible for knowing and keeping track of what medications you are taking and when is it that you will need a refill. The day before your appointment: write a list of all prescriptions that need to be refilled. The day of the appointment: give the list to the admitting nurse. Prescriptions will be written only during appointments. No prescriptions will be written on procedure days. If you forget a medication: it will not be "Called in", "Faxed", or "electronically sent". You will need to get another appointment to get these prescribed. No early refills. Do not call asking to have your prescription filled early. Partial  or short prescriptions: Occasionally your pharmacy may not have enough pills to fill your prescription.  NEVER ACCEPT a partial fill or a prescription that is short of the total amount of pills that you were prescribed.  With controlled substances the law allows 72 hours for the pharmacy  to complete the  prescription.  If the prescription is not completed within 72 hours, the pharmacist will require a new prescription to be written. This means that you will be short on your medicine and we WILL NOT send another prescription to complete your original prescription.  Instead, request the pharmacy to send a carrier to a nearby branch to get enough medication to provide you with your full prescription. Prescription Accuracy: You are responsible for carefully inspecting your prescriptions before leaving our office. Have the discharge nurse carefully go over each prescription with you, before taking them home. Make sure that your name is accurately spelled, that your address is correct. Check the name and dose of your medication to make sure it is accurate. Check the number of pills, and the written instructions to make sure they are clear and accurate. Make sure that you are given enough medication to last until your next medication refill appointment. Taking Medication: Take medication as prescribed. When it comes to controlled substances, taking less pills or less frequently than prescribed is permitted and encouraged. Never take more pills than instructed. Never take the medication more frequently than prescribed.  Inform other Doctors: Always inform, all of your healthcare providers, of all the medications you take. Pain Medication from other Providers: You are not allowed to accept any additional pain medication from any other Doctor or Healthcare provider. There are two exceptions to this rule. (see below) In the event that you require additional pain medication, you are responsible for notifying us, as stated below. Cough Medicine: Often these contain an opioid, such as codeine or hydrocodone. Never accept or take cough medicine containing these opioids if you are already taking an opioid* medication. The combination may cause respiratory failure and death. Medication Agreement: You are  responsible for carefully reading and following our Medication Agreement. This must be signed before receiving any prescriptions from our practice. Safely store a copy of your signed Agreement. Violations to the Agreement will result in no further prescriptions. (Additional copies of our Medication Agreement are available upon request.) Laws, Rules, & Regulations: All patients are expected to follow all Federal and [employer], [employer], Rules, [employer]. Ignorance of the Laws does not constitute a valid excuse.  Illegal drugs and Controlled Substances: The use of illegal substances (including, but not limited to marijuana and its derivatives) and/or the illegal use of any controlled substances is strictly prohibited. Violation of this rule may result in the immediate and permanent discontinuation of any and all prescriptions being written by our practice. The use of any illegal substances is prohibited. Adopted CDC guidelines & recommendations: Target dosing levels will be at or below 60 MME/day. Use of benzodiazepines** is not recommended.  Exceptions: There are only two exceptions to the rule of not receiving pain medications from other Healthcare Providers. Exception #1 (Emergencies): In the event of an emergency (i.e.: accident requiring emergency care), you are allowed to receive additional pain medication. However, you are responsible for: As soon as you are able, call our office [phone], at any time of the day or night, and leave a message stating your name, the date and nature of the emergency, and the name and dose of the medication prescribed. In the event that your call is answered by a member of our staff, make sure to document and save the date, time, and the name of the person that took your information.  Exception #2 (Planned Surgery): In the event that you are scheduled by another doctor or dentist to  have any type of surgery or procedure, you are allowed (for a period no longer  than 30 days), to receive additional pain medication, for the acute post-op pain. However, in this case, you are responsible for picking up a copy of our "Post-op Pain Management for Surgeons" handout, and giving it to your surgeon or dentist. This document is available at our office, and does not require an appointment to obtain it. Simply go to our office during business hours (Monday-Thursday from 8:00 AM to 4:00 PM) (Friday 8:00 AM to 12:00 Noon) or if you have a scheduled appointment with [place], prior to your surgery, and ask for it by name. In addition, you are responsible for: calling our office [phone], at any time of the day or night, and leaving a message stating your name, name of your surgeon, type of surgery, and date of procedure or surgery. Failure to comply with your responsibilities may result in termination of therapy involving the controlled substances. Medication Agreement Violation. Following the above rules, including your responsibilities will help you in avoiding a Medication Agreement Violation ("Breaking your Pain Medication Contract").  Consequences:  Not following the above rules may result in permanent discontinuation of medication prescription therapy.  *Opioid medications include: morphine, codeine, oxycodone, oxymorphone, hydrocodone, hydromorphone, meperidine, tramadol, tapentadol, buprenorphine, fentanyl, methadone. **Benzodiazepine medications include: diazepam (Valium), alprazolam (Xanax), clonazepam (Klonopine), lorazepam (Ativan), clorazepate (Tranxene), chlordiazepoxide (Librium), estazolam (Prosom), oxazepam (Serax), temazepam (Restoril), triazolam (Halcion) (Last updated: 05/21/2022) ______________________________________________________________________    ______________________________________________________________________  Medication Recommendations and Reminders  Applies to: All patients receiving prescriptions (written and/or  electronic).  Medication Rules & Regulations: You are responsible for reading, knowing, and following our "Medication Rules" document. These exist for your safety and that of others. They are not flexible and neither are we. Dismissing or ignoring them is an act of "non-compliance" that may result in complete and irreversible termination of such medication therapy. For safety reasons, "non-compliance" will not be tolerated. As with the [place] fundamental legal principle of "ignorance of the law is no defense", we will accept no excuses for not having read and knowing the content of documents provided to you by our practice.  Pharmacy of record:  Definition: This is the pharmacy where your electronic prescriptions will be sent.  We do not endorse any particular pharmacy. It is up to you and your insurance to decide what pharmacy to use.  We do not restrict you in your choice of pharmacy. However, once we write for your prescriptions, we will NOT be re-sending more prescriptions to fix restricted supply problems created by your pharmacy, or your insurance.  The pharmacy listed in the electronic medical record should be the one where you want electronic prescriptions to be sent. If you choose to change pharmacy, simply notify our nursing staff. Changes will be made only during your regular appointments and not over the phone.  Recommendations: Keep all of your pain medications in a safe place, under lock and key, even if you live alone. We will NOT replace lost, stolen, or damaged medication. We do not accept "Police Reports" as proof of medications having been stolen. After you fill your prescription, take 1 week's worth of pills and put them away in a safe place. You should keep a separate, properly labeled bottle for this purpose. The remainder should be kept in the original bottle. Use this as your primary supply, until it runs out. Once it's gone, then you know that you have 1 week's worth of medicine,  and it is time to come in for a prescription refill. If you do this correctly, it is unlikely that you will ever run out of medicine. To make sure that the above recommendation works, it is very important that you make sure your medication refill appointments are scheduled at least 1 week before you run out of medicine. To do this in an effective manner, make sure that you do not leave the office without scheduling your next medication management appointment. Always ask the nursing staff to show you in your prescription , when your medication will be running out. Then arrange for the receptionist to get you a return appointment, at least 7 days before you run out of medicine. Do not wait until you have 1 or 2 pills left, to come in. This is very poor planning and does not take into consideration that we may need to cancel appointments due to bad weather, sickness, or emergencies affecting our staff. DO NOT ACCEPT A "Partial Fill": If for any reason your pharmacy does not have enough pills/tablets to completely fill or refill your prescription, do not allow for a "partial fill". The law allows the pharmacy to complete that prescription within 72 hours, without requiring a new prescription. If they do not fill the rest of your prescription within those 72 hours, you will need a separate prescription to fill the remaining amount, which we will NOT provide. If the reason for the partial fill is your insurance, you will need to talk to the pharmacist about payment alternatives for the remaining tablets, but again, DO NOT ACCEPT A PARTIAL FILL, unless you can trust your pharmacist to obtain the remainder of the pills within 72 hours.  Prescription refills and/or changes in medication(s):  Prescription refills, and/or changes in dose or medication, will be conducted only during scheduled medication management appointments. (Applies to both, written and electronic prescriptions.) No refills on procedure days. No  medication will be changed or started on procedure days. No changes, adjustments, and/or refills will be conducted on a procedure day. Doing so will interfere with the diagnostic portion of the procedure. No phone refills. No medications will be "called into the pharmacy". No Fax refills. No weekend refills. No Holliday refills. No after hours refills.  Remember:  Business hours are:  Monday to Thursday 8:00 AM to 4:00 PM Provider's Schedule: Milinda Pointer, MD - Appointments are:  Medication management: Monday and Wednesday 8:00 AM to 4:00 PM Procedure day: Tuesday and Thursday 7:30 AM to 4:00 PM Gillis Santa, MD - Appointments are:  Medication management: Tuesday and Thursday 8:00 AM to 4:00 PM Procedure day: Monday and Wednesday 7:30 AM to 4:00 PM (Last update: 05/21/2022) ______________________________________________________________________    ____________________________________________________________________________________________  Drug Holidays  What is a "Drug Holiday"? Drug Holiday: is the name given to the process of slowly tapering down and temporarily stopping the pain medication for the purpose of decreasing or eliminating tolerance to the drug.  Benefits Improved effectiveness Decreased required effective dose Improved pain control End dependence on high dose therapy Decrease cost of therapy Uncovering "opioid-induced hyperalgesia". (OIH)  What is "opioid hyperalgesia"? It is a paradoxical increase in pain caused by exposure to opioids. Stopping the opioid pain medication, contrary to the expected, it actually decreases or completely eliminates the pain. Ref.: "A comprehensive review of opioid-induced hyperalgesia". Brion Aliment, et.al. Pain Physician. 2011 Mar-Apr;14(2):145-61.  What is tolerance? Tolerance: the progressive loss of effectiveness of a pain medicine due to repetitive use. A common problem of opioid  pain medications.  How long should a "Drug  Holiday" last? Effectiveness depends on the patient staying off all opioid pain medicines for a minimum of 14 consecutive days. (2 weeks)  How about just taking less of the medicine? Does not work. Will not accomplish goal of eliminating the excess receptors.  How about switching to a different pain medicine? (AKA. "Opioid rotation") Does not work. Creates the illusion of effectiveness by taking advantage of inaccurate equivalent dose calculations between different opioids. -This "technique" was promoted by studies funded by [employer], such as [employer], creators of "OxyContin".  Can I stop the medicine "cold [place]"? Depends. You should always coordinate with your Pain Specialist to make the transition as smoothly as possible. Avoid stopping the medicine abruptly without consulting. We recommend a "slow taper".  What is a slow taper? Taper: refers to the gradual decrease in dose.   How do I stop/taper the dose? Slowly. Decrease the daily amount of pills that you take by one (1) pill every seven (7) days. This is called a "slow downward taper". Example: if you normally take four (4) pills per day, drop it to three (3) pills per day for seven (7) days, then to two (2) pills per day for seven (7) days, then to one (1) per day for seven (7) days, and then stop the medicine. The 14 day "Drug Holiday" starts on the first day without medicine.   Will I experience withdrawals? Unlikely with a slow taper.  What triggers withdrawals? Withdrawals are triggered by the sudden/abrupt stop of high dose opioids. Withdrawals can be avoided by slowly decreasing the dose over a prolonged period of time.  What are withdrawals? Symptoms associated with sudden/abrupt reduction/stopping of high-dose, long-term use of pain medication. Withdrawal are seldom seen on low dose therapy, or patients rarely taking opioid medication.  Early Withdrawal Symptoms may include: Agitation Anxiety Muscle  aches Increased tearing Insomnia Runny nose Sweating Yawning  Late symptoms may include: Abdominal cramping Diarrhea Dilated pupils Goose bumps Nausea Vomiting  (Last update: 07/07/2022) ____________________________________________________________________________________________   ____________________________________________________________________________________________  Naloxone Nasal Spray  Why am I receiving this medication? [place] STOP ACT requires that all patients taking high dose opioids or at risk of opioids respiratory depression, be prescribed an opioid reversal agent, such as Naloxone (AKA: Narcan).  What is this medication? NALOXONE (nal OX one) treats opioid overdose, which causes slow or shallow breathing, severe drowsiness, or trouble staying awake. Call emergency services after using this medication. You may need additional treatment. Naloxone works by reversing the effects of opioids. It belongs to a group of medications called opioid blockers.  COMMON BRAND NAME(S): Kloxxado, Narcan  What should I tell my care team before I take this medication? They need to know if you have any of these conditions: Heart disease Substance use disorder An unusual or allergic reaction to naloxone, other medications, foods, dyes, or preservatives Pregnant or trying to get pregnant Breast-feeding  When to use this medication? This medication is to be used for the treatment of respiratory depression (less than 8 breaths per minute) secondary to opioid overdose.   How to use this medication? This medication is for use in the nose. Lay the person on their back. Support their neck with your hand and allow the head to tilt back before giving the medication. The nasal spray should be given into 1 nostril. After giving the medication, move the person onto their side. Do not remove or test the nasal spray until ready  to use. Get emergency medical help right away after  giving the first dose of this medication, even if the person wakes up. You should be familiar with how to recognize the signs and symptoms of a narcotic overdose. If more doses are needed, give the additional dose in the other nostril. Talk to your care team about the use of this medication in children. While this medication may be prescribed for children as young as newborns for selected conditions, precautions do apply.  Naloxone Overdosage: If you think you have taken too much of this medicine contact a poison control center or emergency room at once.  NOTE: This medicine is only for you. Do not share this medicine with others.  What if I miss a dose? This does not apply.  What may interact with this medication? This is only used during an emergency. No interactions are expected during emergency use. This list may not describe all possible interactions. Give your health care provider a list of all the medicines, herbs, non-prescription drugs, or dietary supplements you use. Also tell them if you smoke, drink alcohol, or use illegal drugs. Some items may interact with your medicine.  What should I watch for while using this medication? Keep this medication ready for use in the case of an opioid overdose. Make sure that you have the phone number of your care team and local hospital ready. You may need to have additional doses of this medication. Each nasal spray contains a single dose. Some emergencies may require additional doses. After use, bring the treated person to the nearest hospital or call 911. Make sure the treating care team knows that the person has received a dose of this medication. You will receive additional instructions on what to do during and after use of this medication before an emergency occurs.  What side effects may I notice from receiving this medication? Side effects that you should report to your care team as soon as possible: Allergic reactions--skin rash, itching,  hives, swelling of the face, lips, tongue, or throat Side effects that usually do not require medical attention (report these to your care team if they continue or are bothersome): Constipation Dryness or irritation inside the nose Headache Increase in blood pressure Muscle spasms Stuffy nose Toothache This list may not describe all possible side effects. Call your doctor for medical advice about side effects. You may report side effects to FDA at 1-800-FDA-1088.  Where should I keep my medication? Because this is an emergency medication, you should keep it with you at all times.  Keep out of the reach of children and pets. Store between 20 and 25 degrees C (68 and 77 degrees F). Do not freeze. Throw away any unused medication after the expiration date. Keep in original box until ready to use.  NOTE: This sheet is a summary. It may not cover all possible information. If you have questions about this medicine, talk to your doctor, pharmacist, or health care provider.   2023 Elsevier/Gold Standard (2021-03-23 00:00:00)  ____________________________________________________________________________________________

## 2022-10-10 NOTE — Progress Notes (Signed)
Nursing Pain Medication Assessment:  Safety precautions to be maintained throughout the outpatient stay will include: orient to surroundings, keep bed in low position, maintain call bell within reach at all times, provide assistance with transfer out of bed and ambulation.  Medication Inspection Compliance: Pill count conducted under aseptic conditions, in front of the patient. Neither the pills nor the bottle was removed from the patient's sight at any time. Once count was completed pills were immediately returned to the patient in their original bottle.  Medication: Tramadol (Ultram) Pill/Patch Count:  13 of 90 pills remain Pill/Patch Appearance: Markings consistent with prescribed medication Bottle Appearance: Standard pharmacy container. Clearly labeled. Filled Date: 2 / 7/ 2024 Last Medication intake:  TodaySafety precautions to be maintained throughout the outpatient stay will include: orient to surroundings, keep bed in low position, maintain call bell within reach at all times, provide assistance with transfer out of bed and ambulation.

## 2022-10-16 ENCOUNTER — Encounter: Payer: Medicare Other | Admitting: Pain Medicine

## 2022-10-22 DIAGNOSIS — C44729 Squamous cell carcinoma of skin of left lower limb, including hip: Secondary | ICD-10-CM | POA: Diagnosis not present

## 2022-10-22 DIAGNOSIS — D485 Neoplasm of uncertain behavior of skin: Secondary | ICD-10-CM | POA: Diagnosis not present

## 2022-10-24 ENCOUNTER — Other Ambulatory Visit: Payer: Self-pay | Admitting: Family Medicine

## 2022-10-24 DIAGNOSIS — F419 Anxiety disorder, unspecified: Secondary | ICD-10-CM

## 2022-10-24 DIAGNOSIS — N183 Chronic kidney disease, stage 3 unspecified: Secondary | ICD-10-CM

## 2022-10-24 NOTE — Telephone Encounter (Signed)
Requested medication (s) are due for refill today: yes  Requested medication (s) are on the active medication list: yes  Last refill:  08/08/22  Future visit scheduled: yes  Notes to clinic:  Unable to refill per protocol, cannot delegate.      Requested Prescriptions  Pending Prescriptions Disp Refills   clonazePAM (KLONOPIN) 0.5 MG tablet [Pharmacy Med Name: CLONAZEPAM 0.5 MG TAB] 30 tablet     Sig: TAKE 1/2 TABLET BY MOUTH TWICE DAILY     Not Delegated - Psychiatry: Anxiolytics/Hypnotics 2 Failed - 10/24/2022 11:00 AM      Failed - This refill cannot be delegated      Failed - Urine Drug Screen completed in last 360 days      Passed - Patient is not pregnant      Passed - Valid encounter within last 6 months    Recent Outpatient Visits           4 months ago Annual physical exam   [place], DO   10 months ago RLS (restless legs syndrome)   [facility] [place], Devonne Doughty, DO   11 months ago Benign hypertension with CKD (chronic kidney disease) stage III [facility])   [facility] [place], Devonne Doughty, DO   1 year ago Benign hypertension with CKD (chronic kidney disease) stage III [facility])   [facility] O[place] Hauser, DO   1 year ago RUQ abdominal pain   [place], DO       Future Appointments             In 7 months MacDiarmid, Nicki Reaper, MD [facility] [place]            Signed Prescriptions Disp Refills   amLODipine (NORVASC) 5 MG tablet 90 tablet 0    Sig: TAKE 1 TABLET BY MOUTH ONCE DAILY WITH LUNCH     Cardiovascular: Calcium Channel Blockers 2 Failed - 10/24/2022 11:00 AM      Failed - Last BP in normal range    BP Readings from Last 1 Encounters:  10/10/22 (!) 140/72         Passed - Last Heart Rate in normal range    Pulse Readings from  Last 1 Encounters:  10/10/22 78         Passed - Valid encounter within last 6 months    Recent Outpatient Visits           4 months ago Annual physical exam   [facility] [place], Devonne Doughty, DO   10 months ago RLS (restless legs syndrome)   [facility] [place], Devonne Doughty, DO   11 months ago Benign hypertension with CKD (chronic kidney disease) stage III [facility])   [facility] [place], Devonne Doughty, DO   1 year ago Benign hypertension with CKD (chronic kidney disease) stage III [facility])   [place], DO   1 year ago RUQ abdominal pain   [facility] Olin Hauser, DO       Future Appointments             In 7 months MacDiarmid, Nicki Reaper, [place]  lisinopril (ZESTRIL) 40 MG tablet 90 tablet 0    Sig: TAKE 1 TABLET BY MOUTH ONCE DAILY     Cardiovascular:  ACE Inhibitors Failed - 10/24/2022 11:00 AM      Failed - Cr in normal range and within 180 days    Creat  Date Value Ref Range Status  06/28/2022 1.01 (H) 0.60 - 0.95 mg/dL Final         Failed - Last BP in normal range    BP Readings from Last 1 Encounters:  10/10/22 (!) 140/72         Passed - K in normal range and within 180 days    Potassium  Date Value Ref Range Status  06/28/2022 4.2 3.5 - 5.3 mmol/L Final  11/24/2014 4.2 mmol/L Final    Comment:    3.5-5.1 NOTE: New Reference Range  10/04/14          Passed - Patient is not pregnant      Passed - Valid encounter within last 6 months    Recent Outpatient Visits           4 months ago Annual physical exam   [facility] Olin Hauser, DO   10 months ago RLS (restless legs syndrome)   [facility] [place], Devonne Doughty, DO   11 months ago Benign hypertension with  CKD (chronic kidney disease) stage III [facility])   [facility] [place], Devonne Doughty, DO   1 year ago Benign hypertension with CKD (chronic kidney disease) stage III [facility])   [place], DO   1 year ago RUQ abdominal pain   [facility] Olin Hauser, DO       Future Appointments             In 7 months MacDiarmid, Nicki Reaper, [place]

## 2022-10-24 NOTE — Telephone Encounter (Signed)
Requested Prescriptions  Pending Prescriptions Disp Refills   amLODipine (NORVASC) 5 MG tablet [Pharmacy Med Name: AMLODIPINE BESYLATE 5 MG TAB] 90 tablet 0    Sig: TAKE 1 TABLET BY MOUTH ONCE DAILY WITH LUNCH     Cardiovascular: Calcium Channel Blockers 2 Failed - 10/24/2022 11:00 AM      Failed - Last BP in normal range    BP Readings from Last 1 Encounters:  10/10/22 (!) 140/72         Passed - Last Heart Rate in normal range    Pulse Readings from Last 1 Encounters:  10/10/22 78         Passed - Valid encounter within last 6 months    Recent Outpatient Visits           4 months ago Annual physical exam   [facility] [place], Devonne Doughty, DO   10 months ago RLS (restless legs syndrome)   [facility] [place], Devonne Doughty, DO   11 months ago Benign hypertension with CKD (chronic kidney disease) stage III [facility])   [facility] [place], Devonne Doughty, DO   1 year ago Benign hypertension with CKD (chronic kidney disease) stage III [facility])   [facility] [place], Devonne Doughty, DO   1 year ago RUQ abdominal pain   [place], DO       Future Appointments             In 7 months MacDiarmid, Nicki Reaper, MD [facility] [place]             lisinopril (ZESTRIL) 40 MG tablet [Pharmacy Med Name: LISINOPRIL 40 MG TAB] 90 tablet 0    Sig: TAKE 1 TABLET BY MOUTH ONCE DAILY     Cardiovascular:  ACE Inhibitors Failed - 10/24/2022 11:00 AM      Failed - Cr in normal range and within 180 days    Creat  Date Value Ref Range Status  06/28/2022 1.01 (H) 0.60 - 0.95 mg/dL Final         Failed - Last BP in normal range    BP Readings from Last 1 Encounters:  10/10/22 (!) 140/72         Passed - K in normal range and within 180 days    Potassium  Date Value Ref Range Status  06/28/2022 4.2 3.5 - 5.3  mmol/L Final  11/24/2014 4.2 mmol/L Final    Comment:    3.5-5.1 NOTE: New Reference Range  10/04/14          Passed - Patient is not pregnant      Passed - Valid encounter within last 6 months    Recent Outpatient Visits           4 months ago Annual physical exam   [facility], DO   10 months ago RLS (restless legs syndrome)   [facility] [place], Devonne Doughty, DO   11 months ago Benign hypertension with CKD (chronic kidney disease) stage III [facility])   [facility] [place], Devonne Doughty, DO   1 year ago Benign hypertension with CKD (chronic kidney disease) stage III [facility])   [place], DO   1 year ago RUQ abdominal pain   [place], DO       Future Appointments             In 7 months MacDiarmid, Nicki Reaper, MD [facility] [place]             clonazePAM [facility]) 0.5 MG tablet [Pharmacy Med Name: CLONAZEPAM 0.5 MG TAB] 30 tablet     Sig: TAKE 1/2 TABLET BY MOUTH TWICE DAILY     Not Delegated - Psychiatry: Anxiolytics/Hypnotics 2 Failed - 10/24/2022 11:00 AM      Failed - This refill cannot be delegated      Failed - Urine Drug Screen completed in last 360 days      Passed - Patient is not pregnant      Passed - Valid encounter within last 6 months    Recent Outpatient Visits           4 months ago Annual physical exam   [facility], DO   10 months ago RLS (restless legs syndrome)   [facility], DO   11 months ago Benign hypertension with CKD (chronic kidney disease) stage III [facility])   [facility] [place], Devonne Doughty, DO   1 year ago Benign hypertension with CKD (chronic kidney disease) stage III  [facility])   [place], DO   1 year ago RUQ abdominal pain   [place], DO       Future Appointments             In 7 months MacDiarmid, Nicki Reaper, [place]

## 2022-10-30 ENCOUNTER — Other Ambulatory Visit: Payer: Self-pay | Admitting: Family Medicine

## 2022-10-30 DIAGNOSIS — F5101 Primary insomnia: Secondary | ICD-10-CM

## 2022-10-30 DIAGNOSIS — I129 Hypertensive chronic kidney disease with stage 1 through stage 4 chronic kidney disease, or unspecified chronic kidney disease: Secondary | ICD-10-CM

## 2022-10-30 NOTE — Telephone Encounter (Signed)
Requested Prescriptions  Pending Prescriptions Disp Refills   traZODone (DESYREL) 50 MG tablet [Pharmacy Med Name: TRAZODONE HCL 50 MG TAB] 90 tablet 0    Sig: TAKE 1 TABLET BY MOUTH AT BEDTIME     Psychiatry: Antidepressants - Serotonin Modulator Passed - 10/30/2022  1:36 PM      Passed - Valid encounter within last 6 months    Recent Outpatient Visits           4 months ago Annual physical exam   [facility] [place], Devonne Doughty, DO   10 months ago RLS (restless legs syndrome)   [facility] [place], Devonne Doughty, DO   11 months ago Benign hypertension with CKD (chronic kidney disease) stage III [facility])   [facility] [place], Devonne Doughty, DO   1 year ago Benign hypertension with CKD (chronic kidney disease) stage III [facility])   [facility] [place], Devonne Doughty, DO   1 year ago RUQ abdominal pain   [facility] [place], Devonne Doughty, DO       Future Appointments             In 7 months MacDiarmid, Nicki Reaper, MD [facility]              furosemide (LASIX) 20 MG tablet [Pharmacy Med Name: FUROSEMIDE 20 MG TAB] 45 tablet 2    Sig: TAKE 1 and 1/2 TABLETS BY MOUTH ONCE DAILY     Cardiovascular:  Diuretics - Loop Failed - 10/30/2022  1:36 PM      Failed - Cr in normal range and within 180 days    Creat  Date Value Ref Range Status  06/28/2022 1.01 (H) 0.60 - 0.95 mg/dL Final         Failed - Mg Level in normal range and within 180 days    Magnesium  Date Value Ref Range Status  01/14/2014 2.1 mg/dL Final    Comment:    1.8-2.4 THERAPEUTIC RANGE: 4-7 mg/dL TOXIC: > 10 mg/dL  -----------------------          Failed - Last BP in normal range    BP Readings from Last 1 Encounters:  10/10/22 (!) 140/72         Passed - K in normal range and within 180 days    Potassium  Date Value Ref Range Status   06/28/2022 4.2 3.5 - 5.3 mmol/L Final  11/24/2014 4.2 mmol/L Final    Comment:    3.5-5.1 NOTE: New Reference Range  10/04/14          Passed - Ca in normal range and within 180 days    Calcium  Date Value Ref Range Status  06/28/2022 9.3 8.6 - 10.4 mg/dL Final   Calcium, Total  Date Value Ref Range Status  11/24/2014 8.9 mg/dL Final    Comment:    8.9-10.3 NOTE: New Reference Range  10/04/14          Passed - Na in normal range and within 180 days    Sodium  Date Value Ref Range Status  06/28/2022 137 135 - 146 mmol/L Final  11/24/2014 131 (L) mmol/L Final    Comment:    135-145 NOTE: New Reference Range  10/04/14          Passed - Cl in normal range and within 180 days    Chloride  Date Value Ref Range  Status  06/28/2022 100 98 - 110 mmol/L Final  11/24/2014 97 (L) mmol/L Final    Comment:    101-111 NOTE: New Reference Range  10/04/14          Passed - Valid encounter within last 6 months    Recent Outpatient Visits           4 months ago Annual physical exam   [facility], DO   10 months ago RLS (restless legs syndrome)   [facility], DO   11 months ago Benign hypertension with CKD (chronic kidney disease) stage III [facility])   [facility], DO   1 year ago Benign hypertension with CKD (chronic kidney disease) stage III [facility])   [place], DO   1 year ago RUQ abdominal pain   [facility] O[facility] Haus[facility], DO       Future Appointments             In 7 months MacDiarmid, Nicki Reaper, MD [place]

## 2022-11-15 DIAGNOSIS — C44729 Squamous cell carcinoma of skin of left lower limb, including hip: Secondary | ICD-10-CM | POA: Diagnosis not present

## 2022-11-26 ENCOUNTER — Ambulatory Visit
Admission: RE | Admit: 2022-11-26 | Discharge: 2022-11-26 | Disposition: A | Discharge status: Home / Self Care | Payer: Medicare Other | Source: Ambulatory Visit | Attending: Vascular Surgery | Admitting: Vascular Surgery

## 2022-11-26 DIAGNOSIS — I7141 Pararenal abdominal aortic aneurysm, without rupture: Secondary | ICD-10-CM | POA: Diagnosis not present

## 2022-11-26 DIAGNOSIS — I700 Atherosclerosis of aorta: Secondary | ICD-10-CM | POA: Diagnosis not present

## 2022-12-03 ENCOUNTER — Encounter (INDEPENDENT_AMBULATORY_CARE_PROVIDER_SITE_OTHER): Payer: Self-pay | Admitting: Vascular Surgery

## 2022-12-03 ENCOUNTER — Ambulatory Visit (INDEPENDENT_AMBULATORY_CARE_PROVIDER_SITE_OTHER): Payer: Medicare Other | Admitting: Vascular Surgery

## 2022-12-03 VITALS — BP 159/77 | HR 56 | Resp 16 | Ht 64.0 in | Wt 120.0 lb

## 2022-12-03 DIAGNOSIS — I10 Essential (primary) hypertension: Secondary | ICD-10-CM

## 2022-12-03 DIAGNOSIS — N183 Chronic kidney disease, stage 3 unspecified: Secondary | ICD-10-CM | POA: Diagnosis not present

## 2022-12-03 DIAGNOSIS — I7141 Pararenal abdominal aortic aneurysm, without rupture: Secondary | ICD-10-CM

## 2022-12-03 DIAGNOSIS — I129 Hypertensive chronic kidney disease with stage 1 through stage 4 chronic kidney disease, or unspecified chronic kidney disease: Secondary | ICD-10-CM | POA: Diagnosis not present

## 2022-12-03 DIAGNOSIS — E782 Mixed hyperlipidemia: Secondary | ICD-10-CM

## 2022-12-03 NOTE — Progress Notes (Signed)
MRN : [id number]  Brittney Simpson is a 89 y.o. (1928-07-23) female who presents with chief complaint of No chief complaint on file. Marland Kitchen  History of Present Illness: Patient returns today in follow up of her abdominal aortic aneurysm.  She has undergone a CT scan of the abdomen and pelvis which I have independently reviewed.  This shows a stable perirenal aortic aneurysm with a 4.5 to 4.6 cm in maximal diameter range.  There is then a narrowing in the immediate infrarenal aorta and then the mid to distal infrarenal aorta is stable just less than 4 cm in maximal diameter.  There is marked calcific atherosclerotic changes throughout her aorta and iliac segments.  Current Outpatient Medications  Medication Sig Dispense Refill   acetaminophen (TYLENOL) 500 MG tablet Take 1,000 mg by mouth daily as needed for moderate pain or headache.     amLODipine (NORVASC) 5 MG tablet TAKE 1 TABLET BY MOUTH ONCE DAILY WITH LUNCH 90 tablet 0   brimonidine (ALPHAGAN) 0.2 % ophthalmic solution Place 1 drop into the left eye 2 (two) times daily.     Cholecalciferol (VITAMIN D3) 50 MCG (2000 UT) TABS Take 2,000 Units by mouth daily with lunch.     clonazePAM (KLONOPIN) 0.5 MG tablet TAKE 1/2 TABLET BY MOUTH TWICE DAILY 30 tablet 2   Cranberry 500 MG CAPS Take 1,000 mg by mouth daily with supper.     furosemide (LASIX) 20 MG tablet TAKE 1 and 1/2 TABLETS BY MOUTH ONCE DAILY 45 tablet 2   latanoprost (XALATAN) 0.005 % ophthalmic solution Place 1 drop into both eyes at bedtime.      levothyroxine (SYNTHROID) 112 MCG tablet TAKE 1 TABLET BY MOUTH ONCE DAILY ON AN EMPTY STOMACH. WAIT 30 MINUTES BEFORE TAKING OTHER MEDS. 90 tablet 2   lisinopril (ZESTRIL) 40 MG tablet TAKE 1 TABLET BY MOUTH ONCE DAILY 90 tablet 0   Melatonin 10 MG TABS Take 10 mg by mouth at bedtime.     methocarbamol (ROBAXIN) 500 MG tablet Take 1 tablet (500 mg total) by mouth daily as needed for muscle spasms. 30 tablet 0   metoprolol succinate  (TOPROL-XL) 100 MG 24 hr tablet Take 1 tablet (100 mg total) by mouth daily. with food 90 tablet 3   omeprazole (PRILOSEC) 20 MG capsule TAKE 1 CAPSULE BY MOUTH ONCE DAILY BEFORE BREAKFAST 90 capsule 1   tamsulosin (FLOMAX) 0.4 MG CAPS capsule Take 2 capsules (0.8 mg total) by mouth daily. 60 capsule 11   traMADol (ULTRAM) 50 MG tablet Take 1 tablet (50 mg total) by mouth 3 (three) times daily. Each refill must last 30 days. 90 tablet 5   traZODone (DESYREL) 50 MG tablet TAKE 1 TABLET BY MOUTH AT BEDTIME 90 tablet 0   No current facility-administered medications for this visit.    Past Medical History:  Diagnosis Date   Anxiety    Arthritis    Back ache    Hiatal hernia    Hyperlipidemia    Hypertension    Hypothyroid    Osteopenia    Thyroid disease    UTI (urinary tract infection)     Past Surgical History:  Procedure Laterality Date   CHOLECYSTECTOMY     HEMORRHOID SURGERY     HERNIA REPAIR     HERNIA REPAIR     KYPHOPLASTY N/A 01/23/2017   Procedure: [id number];  Surgeon: Kennedy Bucker, MD;  Location: [facility] ORS;  Service: Orthopedics;  Laterality: N/A;   KYPHOPLASTY N/A  01/25/2020   Procedure: L4 KYPHOPLASTY;  Surgeon: Kennedy Bucker, MD;  Location: [facility] ORS;  Service: Orthopedics;  Laterality: N/A;     Social History   Tobacco Use   Smoking status: Never   Smokeless tobacco: Never  Vaping Use   Vaping Use: Never used  Substance Use Topics   Alcohol use: No    Alcohol/week: 0.0 standard drinks of alcohol   Drug use: No       Family History  Problem Relation Age of Onset   Stroke Mother    Cancer Father      No Known Allergies  REVIEW OF SYSTEMS (Negative unless checked)   Constitutional: [] Weight loss  [] Fever  [] Chills Cardiac: [] Chest pain   [] Chest pressure   [] Palpitations   [] Shortness of breath when laying flat   [] Shortness of breath at rest   [] Shortness of breath with exertion. Vascular:  [] Pain in legs with walking   [] Pain in legs at  rest   [] Pain in legs when laying flat   [] Claudication   [] Pain in feet when walking  [] Pain in feet at rest  [] Pain in feet when laying flat   [] History of DVT   [] Phlebitis   [] Swelling in legs   [] Varicose veins   [] Non-healing ulcers Pulmonary:   [] Uses home oxygen   [] Productive cough   [] Hemoptysis   [] Wheeze  [] COPD   [] Asthma Neurologic:  [] Dizziness  [] Blackouts   [] Seizures   [] History of stroke   [] History of TIA  [] Aphasia   [] Temporary blindness   [] Dysphagia   [] Weakness or numbness in arms   [] Weakness or numbness in legs Musculoskeletal:  [x] Arthritis   [] Joint swelling   [] Joint pain   [x] Low back pain Hematologic:  [] Easy bruising  [] Easy bleeding   [] Hypercoagulable state   [] Anemic  [] Hepatitis Gastrointestinal:  [] Blood in stool   [] Vomiting blood  [] Gastroesophageal reflux/heartburn   [] Abdominal pain Genitourinary:  [] Chronic kidney disease   [] Difficult urination  [x] Frequent urination  [] Burning with urination   [] Hematuria Skin:  [] Rashes   [] Ulcers   [] Wounds Psychological:  [x] History of anxiety   []  History of major depression  Physical Examination  There were no vitals taken for this visit. Gen:  WD/WN, NAD. Appears younger than stated age Head: [place]/AT, No temporalis wasting. Ear/Nose/Throat: Hearing grossly intact, nares w/o erythema or drainage Eyes: Conjunctiva clear. Sclera non-icteric Neck: Supple.  Trachea midline Pulmonary:  Good air movement, no use of accessory muscles.  Cardiac: RRR, no JVD Vascular:  Vessel Right Left  Radial Palpable Palpable           Musculoskeletal: M/S 5/5 throughout.  No deformity or atrophy. No edema. Neurologic: Sensation grossly intact in extremities.  Symmetrical.  Speech is fluent.  Psychiatric: Judgment intact, Mood & affect appropriate for pt's clinical situation. Dermatologic: No rashes or ulcers noted.  No cellulitis or open wounds.      Labs No results found for this or any previous visit (from the past 2160  hour(s)).  Radiology CT ABDOMEN PELVIS WO CONTRAST  Result Date: 11/29/2022 CLINICAL DATA:  Abdominal aortic aneurysm. EXAM: CT ABDOMEN AND PELVIS WITHOUT CONTRAST TECHNIQUE: Multidetector CT imaging of the abdomen and pelvis was performed following the standard protocol without IV contrast. RADIATION DOSE REDUCTION: This exam was performed according to the departmental dose-optimization program which includes automated exposure control, adjustment of the mA and/or kV according to patient size and/or use of iterative reconstruction technique. COMPARISON:  05/14/2022 and 03/20/2021 FINDINGS: Lower chest:  Chest heart is enlarged. Subsegmental atelectasis/linear scarring noted in the lung bases. Hepatobiliary: No suspicious focal abnormality in the liver on this study without intravenous contrast. Nonvisualization of the gallbladder suggest cholecystectomy. No intrahepatic or extrahepatic biliary dilation. Pancreas: No focal mass lesion. No dilatation of the main duct. No intraparenchymal cyst. No peripancreatic edema. Spleen: No splenomegaly. No focal mass lesion. Adrenals/Urinary Tract: No adrenal nodule or mass. Similar appearance of collecting system fullness in both kidneys with patulous renal pelvis bilaterally. No hydroureter. Bladder is distended. Stomach/Bowel: Moderate hiatal hernia. Stomach otherwise unremarkable. Duodenum is normally positioned as is the ligament of Treitz. No small bowel wall thickening. No small bowel dilatation. The appendix is normal. No gross colonic mass. No colonic wall thickening. Vascular/Lymphatic: Markedly tortuous abdominal aorta again noted with juxtarenal hourglass type abdominal aortic aneurysm. Proximal component measures 4.6 cm diameter just below the diaphragmatic hiatus, at the same level it measured 4.5 cm diameter on 05/14/2022. Distal infrarenal component just proximal to the bifurcation measures 3.8 cm maximum diameter, stable. There is no gastrohepatic or  hepatoduodenal ligament lymphadenopathy. No retroperitoneal or mesenteric lymphadenopathy. No pelvic sidewall lymphadenopathy. Reproductive: Unremarkable. Other: No intraperitoneal free fluid. Musculoskeletal: No worrisome lytic or sclerotic osseous abnormality. Marked compression deformity at T12 is stable with evidence of vertebral augmentation. Patient is status post augmentation at the L4 level is well. Advanced degenerative disc disease again noted lumbar spine. IMPRESSION: 1. Stable exam. No acute findings in the abdomen or pelvis. 2. Markedly tortuous abdominal aorta with juxtarenal hourglass type abdominal aortic aneurysm. Proximal component measures 4.6 cm diameter just below the diaphragmatic hiatus, at the same level it measured 4.5 cm diameter on 05/14/2022. Distal infrarenal component just proximal to the bifurcation measures 3.8 cm maximum diameter, stable. Recommend follow-up CT/MR every 6 months and vascular consultation. This recommendation follows ACR consensus guidelines: White Paper of the ACR Incidental Findings Committee II on Vascular Findings. J Am Coll Radiol 2013; 10:789-794. 3. Similar appearance of collecting system fullness in both kidneys with patulous renal pelvis bilaterally. No hydroureter. 4. Moderate hiatal hernia. 5.  Aortic Atherosclerois (ICD10-170.0) Electronically Signed   By: Kennith Center M.D.   On: 11/29/2022 07:12    Assessment/Plan  Essential hypertension blood pressure control important in reducing the progression of atherosclerotic disease and aneurysmal degeneration. On appropriate oral medications.     Hyperlipidemia lipid control important in reducing the progression of atherosclerotic disease. Continue statin therapy     Chronic low back pain (Bilateral) (R>L) This not likely from her aneurysm   CKD (chronic kidney disease) stage 3, GFR 30-59 ml/min Sees nephrology.     AAA (abdominal aortic aneurysm) without rupture (HCC) I have independently  reviewed her CT angiogram of the abdomen pelvis that was performed to evaluate her aneurysm.  All in all, there has not been significant change from her CT scan six months ago  She has a pararenal portion of the aneurysm which is stable at 4.5-4.6 cm this likely not well seen on duplex.  The infrarenal portion of her aneurysm measured approximately 3.9 cm in maximal diameter. Given the size ranges, we will not likely even consider repair until she reaches closer to 5.5 cm in the perirenal and suprarenal aorta and closer to 5 cm in the infrarenal aorta.  Fortunately, that is not the case.  Given her advanced age continuing to monitor this would be reasonable.  Going to plan on doing uninfused CT scans to monitor this going forward as it is a difficult location  to follow-up with duplex. She wants to do this annually which is fine.   Festus Barren, MD  12/03/2022 10:24 AM    This note was created with Dragon medical transcription system.  Any errors from dictation are purely unintentional

## 2022-12-12 ENCOUNTER — Other Ambulatory Visit: Payer: Self-pay | Admitting: Family Medicine

## 2022-12-12 DIAGNOSIS — K219 Gastro-esophageal reflux disease without esophagitis: Secondary | ICD-10-CM

## 2022-12-12 DIAGNOSIS — I129 Hypertensive chronic kidney disease with stage 1 through stage 4 chronic kidney disease, or unspecified chronic kidney disease: Secondary | ICD-10-CM

## 2022-12-12 NOTE — Telephone Encounter (Signed)
Requested Prescriptions  Pending Prescriptions Disp Refills   omeprazole (PRILOSEC) 20 MG capsule [Pharmacy Med Name: OMEPRAZOLE DR 20 MG CAP] 90 capsule 1    Sig: TAKE 1 CAPSULE BY MOUTH ONCE DAILY BEFORE BREAKFAST     Gastroenterology: Proton Pump Inhibitors Passed - 12/12/2022 11:38 AM      Passed - Valid encounter within last 12 months    Recent Outpatient Visits           5 months ago Annual physical exam   [place] [facility] Smitty Cords, DO   12 months ago RLS (restless legs syndrome)   [place] [facility] [place], Netta Neat, DO   1 year ago Benign hypertension with CKD (chronic kidney disease) stage III [facility])   [place] [facility] [place], Netta Neat, DO   1 year ago Benign hypertension with CKD (chronic kidney disease) stage III [facility])   [place] [facility] Smitty Cords, DO   1 year ago RUQ abdominal pain   [place] [facility] [place], Netta Neat, DO       Future Appointments             In 5 months MacDiarmid, Lorin Picket, MD [facility] [place]             lisinopril (ZESTRIL) 40 MG tablet [Pharmacy Med Name: LISINOPRIL 40 MG TAB] 90 tablet 0    Sig: TAKE 1 TABLET BY MOUTH ONCE DAILY     Cardiovascular:  ACE Inhibitors Failed - 12/12/2022 11:38 AM      Failed - Cr in normal range and within 180 days    Creat  Date Value Ref Range Status  06/28/2022 1.01 (H) 0.60 - 0.95 mg/dL Final         Failed - Last BP in normal range    BP Readings from Last 1 Encounters:  12/03/22 (!) 159/77         Passed - K in normal range and within 180 days    Potassium  Date Value Ref Range Status  06/28/2022 4.2 3.5 - 5.3 mmol/L Final  11/24/2014 4.2 mmol/L Final    Comment:    3.5-5.1 NOTE: New Reference Range  10/04/14          Passed - Patient is not pregnant      Passed - Valid encounter within last 6 months    Recent  Outpatient Visits           5 months ago Annual physical exam   [place] [facility] Smitty Cords, DO   12 months ago RLS (restless legs syndrome)   [place] [facility] [place], Netta Neat, DO   1 year ago Benign hypertension with CKD (chronic kidney disease) stage III [facility])   [place] [facility] [place], Netta Neat, DO   1 year ago Benign hypertension with CKD (chronic kidney disease) stage III [facility])   [place] [facility] Smitty Cords, DO   1 year ago RUQ abdominal pain   [place] [facility] Smitty Cords, DO       Future Appointments             In 5 months MacDiarmid, Lorin Picket, MD [facility] [place]             amLODipine (NORVASC) 5 MG tablet [facility] Med Name: AMLODIPINE  BESYLATE 5 MG TAB] 90 tablet 0    Sig: TAKE 1 TABLET BY MOUTH ONCE DAILY WITH LUNCH     Cardiovascular: Calcium Channel Blockers 2 Failed - 12/12/2022 11:38 AM      Failed - Last BP in normal range    BP Readings from Last 1 Encounters:  12/03/22 (!) 159/77         Passed - Last Heart Rate in normal range    Pulse Readings from Last 1 Encounters:  12/03/22 (!) 56         Passed - Valid encounter within last 6 months    Recent Outpatient Visits           5 months ago Annual physical exam   [place] [facility] Smitty Cords, DO   12 months ago RLS (restless legs syndrome)   [place] [facility] Smitty Cords, DO   1 year ago Benign hypertension with CKD (chronic kidney disease) stage III [facility])   [place] [facility] Smitty Cords, DO   1 year ago Benign hypertension with CKD (chronic kidney disease) stage III [facility])   [place] [facility] Smitty Cords, DO   1 year ago RUQ abdominal pain   [place] [facility] Smitty Cords, DO       Future Appointments             In 5 months MacDiarmid, Lorin Picket, MD [facility]

## 2022-12-16 ENCOUNTER — Other Ambulatory Visit: Payer: Self-pay | Admitting: Family Medicine

## 2022-12-16 DIAGNOSIS — K219 Gastro-esophageal reflux disease without esophagitis: Secondary | ICD-10-CM

## 2022-12-17 NOTE — Telephone Encounter (Signed)
Requested Prescriptions  Pending Prescriptions Disp Refills   omeprazole (PRILOSEC) 20 MG capsule [Pharmacy Med Name: OMEPRAZOLE DR 20 MG CAP] 90 capsule 0    Sig: TAKE 1 CAPSULE BY MOUTH ONCE DAILY BEFORE BREAKFAST     Gastroenterology: Proton Pump Inhibitors Passed - 12/16/2022 12:50 PM      Passed - Valid encounter within last 12 months    Recent Outpatient Visits           5 months ago Annual physical exam   [place] [facility] Smitty Cords, DO   1 year ago RLS (restless legs syndrome)   [place] [facility] Smitty Cords, DO   1 year ago Benign hypertension with CKD (chronic kidney disease) stage III [facility])   [place] [facility] Smitty Cords, DO   1 year ago Benign hypertension with CKD (chronic kidney disease) stage III [facility])   [place] [facility] Smitty Cords, DO   1 year ago RUQ abdominal pain   [place] [facility] Smitty Cords, DO       Future Appointments             In 5 months MacDiarmid, Lorin Picket, MD [facility]

## 2022-12-24 ENCOUNTER — Other Ambulatory Visit: Payer: Self-pay | Admitting: Family Medicine

## 2022-12-24 DIAGNOSIS — E034 Atrophy of thyroid (acquired): Secondary | ICD-10-CM

## 2022-12-25 NOTE — Telephone Encounter (Signed)
Requested Prescriptions  Pending Prescriptions Disp Refills   levothyroxine (SYNTHROID) 112 MCG tablet [Pharmacy Med Name: LEVOTHYROXINE SODIUM 112 MCG TAB] 90 tablet 1    Sig: TAKE 1 TABLET BY MOUTH ONCE DAILY ON AN EMPTY STOMACH. WAIT 30 MINUTES BEFORE TAKING OTHER MEDS.     Endocrinology:  Hypothyroid Agents Passed - 12/24/2022  3:22 PM      Passed - TSH in normal range and within 360 days    TSH  Date Value Ref Range Status  06/28/2022 2.77 0.40 - 4.50 mIU/L Final         Passed - Valid encounter within last 12 months    Recent Outpatient Visits           6 months ago Annual physical exam   [place] [facility] Smitty Cords, DO   1 year ago RLS (restless legs syndrome)   [place] [facility] Smitty Cords, DO   1 year ago Benign hypertension with CKD (chronic kidney disease) stage III [facility])   [place] [facility] Smitty Cords, DO   1 year ago Benign hypertension with CKD (chronic kidney disease) stage III [facility])   [place] [facility] Smitty Cords, DO   1 year ago RUQ abdominal pain   [place] [facility] Smitty Cords, DO       Future Appointments             In 5 months MacDiarmid, Lorin Picket, MD [facility]

## 2023-01-01 DIAGNOSIS — I10 Essential (primary) hypertension: Secondary | ICD-10-CM | POA: Diagnosis not present

## 2023-01-01 DIAGNOSIS — N390 Urinary tract infection, site not specified: Secondary | ICD-10-CM | POA: Diagnosis not present

## 2023-01-01 DIAGNOSIS — N1831 Chronic kidney disease, stage 3a: Secondary | ICD-10-CM | POA: Diagnosis not present

## 2023-01-08 ENCOUNTER — Other Ambulatory Visit: Payer: Self-pay | Admitting: Family Medicine

## 2023-01-08 DIAGNOSIS — E871 Hypo-osmolality and hyponatremia: Secondary | ICD-10-CM | POA: Diagnosis not present

## 2023-01-08 DIAGNOSIS — N390 Urinary tract infection, site not specified: Secondary | ICD-10-CM | POA: Diagnosis not present

## 2023-01-08 DIAGNOSIS — N183 Chronic kidney disease, stage 3 unspecified: Secondary | ICD-10-CM | POA: Diagnosis not present

## 2023-01-08 DIAGNOSIS — F419 Anxiety disorder, unspecified: Secondary | ICD-10-CM

## 2023-01-08 DIAGNOSIS — I129 Hypertensive chronic kidney disease with stage 1 through stage 4 chronic kidney disease, or unspecified chronic kidney disease: Secondary | ICD-10-CM

## 2023-01-08 DIAGNOSIS — I10 Essential (primary) hypertension: Secondary | ICD-10-CM | POA: Diagnosis not present

## 2023-01-08 DIAGNOSIS — R600 Localized edema: Secondary | ICD-10-CM | POA: Diagnosis not present

## 2023-01-09 NOTE — Telephone Encounter (Signed)
Requested medication (s) are due for refill today: yes  Requested medication (s) are on the active medication list: yes  Last refill:  10/24/22  Future visit scheduled: no  Notes to clinic:  Unable to refill per protocol, cannot delegate.      Requested Prescriptions  Pending Prescriptions Disp Refills   clonazePAM (KLONOPIN) 0.5 MG tablet [Pharmacy Med Name: CLONAZEPAM 0.5 MG TAB] 30 tablet     Sig: TAKE 1/2 TABLET BY MOUTH TWICE DAILY     Not Delegated - Psychiatry: Anxiolytics/Hypnotics 2 Failed - 01/08/2023 11:54 AM      Failed - This refill cannot be delegated      Failed - Urine Drug Screen completed in last 360 days      Failed - Valid encounter within last 6 months    Recent Outpatient Visits           6 months ago Annual physical exam   [place] [facility] Smitty Cords, DO   1 year ago RLS (restless legs syndrome)   [place] [facility] Smitty Cords, DO   1 year ago Benign hypertension with CKD (chronic kidney disease) stage III [facility])   [place] [facility] [place], Netta Neat, DO   1 year ago Benign hypertension with CKD (chronic kidney disease) stage III [facility])   [place] [facility], Netta Neat, DO   1 year ago RUQ abdominal pain   [place] [facility] [place], Netta Neat, DO       Future Appointments             In 5 months MacDiarmid, Lorin Picket, MD [facility] - Patient is not pregnant      Signed Prescriptions Disp Refills   lisinopril (ZESTRIL) 40 MG tablet 90 tablet 0    Sig: TAKE 1 TABLET BY MOUTH ONCE DAILY     Cardiovascular:  ACE Inhibitors Failed - 01/08/2023 11:54 AM      Failed - Cr in normal range and within 180 days    Creat  Date Value Ref Range Status  06/28/2022 1.01 (H) 0.60 - 0.95 mg/dL Final         Failed - K in normal range and within 180 days     Potassium  Date Value Ref Range Status  06/28/2022 4.2 3.5 - 5.3 mmol/L Final  11/24/2014 4.2 mmol/L Final    Comment:    3.5-5.1 NOTE: New Reference Range  10/04/14          Failed - Last BP in normal range    BP Readings from Last 1 Encounters:  12/03/22 (!) 159/77         Failed - Valid encounter within last 6 months    Recent Outpatient Visits           6 months ago Annual physical exam   [place] [facility] Smitty Cords, DO   1 year ago RLS (restless legs syndrome)   [place] [facility] Smitty Cords, DO   1 year ago Benign hypertension with CKD (chronic kidney disease) stage III [facility])   [place] [facility] [place], Netta Neat, DO   1 year ago Benign hypertension with CKD (chronic kidney disease) stage III [facility])    [facility] [place], Netta Neat, [place]  1 year ago RUQ abdominal pain   [place] [facility] [place], Netta Neat, DO       Future Appointments             In 5 months MacDiarmid, Lorin Picket, MD [facility] - Patient is not pregnant       amLODipine (NORVASC) 5 MG tablet 90 tablet 0    Sig: TAKE 1 TABLET BY MOUTH ONCE DAILY WITH LUNCH     Cardiovascular: Calcium Channel Blockers 2 Failed - 01/08/2023 11:54 AM      Failed - Last BP in normal range    BP Readings from Last 1 Encounters:  12/03/22 (!) 159/77         Failed - Valid encounter within last 6 months    Recent Outpatient Visits           6 months ago Annual physical exam   [place] [facility] Smitty Cords, DO   1 year ago RLS (restless legs syndrome)   [place] [facility] Smitty Cords, DO   1 year ago Benign hypertension with CKD (chronic kidney disease) stage III [facility])   [place] [facility] Smitty Cords, DO   1  year ago Benign hypertension with CKD (chronic kidney disease) stage III [facility])   [place] [facility] Smitty Cords, DO   1 year ago RUQ abdominal pain   [place] [facility] Smitty Cords, DO       Future Appointments             In 5 months MacDiarmid, Lorin Picket, MD [facility] [place]            Passed - Last Heart Rate in normal range    Pulse Readings from Last 1 Encounters:  12/03/22 (!) 56

## 2023-01-09 NOTE — Telephone Encounter (Signed)
Patient needs OV for additional refills.  Requested Prescriptions  Pending Prescriptions Disp Refills   lisinopril (ZESTRIL) 40 MG tablet [Pharmacy Med Name: LISINOPRIL 40 MG TAB] 90 tablet 0    Sig: TAKE 1 TABLET BY MOUTH ONCE DAILY     Cardiovascular:  ACE Inhibitors Failed - 01/08/2023 11:54 AM      Failed - Cr in normal range and within 180 days    Creat  Date Value Ref Range Status  06/28/2022 1.01 (H) 0.60 - 0.95 mg/dL Final         Failed - K in normal range and within 180 days    Potassium  Date Value Ref Range Status  06/28/2022 4.2 3.5 - 5.3 mmol/L Final  11/24/2014 4.2 mmol/L Final    Comment:    3.5-5.1 NOTE: New Reference Range  10/04/14          Failed - Last BP in normal range    BP Readings from Last 1 Encounters:  12/03/22 (!) 159/77         Failed - Valid encounter within last 6 months    Recent Outpatient Visits           6 months ago Annual physical exam   [place] [facility] Smitty C[facility], DO   1 year ago RLS (restless legs syndrome)   [place] [facility] Smitty Cords, DO   1 year ago Benign hypertension with CKD (chronic kidney disease) stage III [facility])   [place] [facility] [place], Netta Neat, DO   1 year ago Benign hypertension with CKD (chronic kidney disease) stage III [facility])   [place] [facility] Smitty Cords, DO   1 year ago RUQ abdominal pain   [place] [facility] Smitty Cords, DO       Future Appointments             In 5 months MacDiarmid, Lorin Picket, MD [facility] - Patient is not pregnant       amLODipine (NORVASC) 5 MG tablet [Pharmacy Med Name: AMLODIPINE BESYLATE 5 MG TAB] 90 tablet 0    Sig: TAKE 1 TABLET BY MOUTH ONCE DAILY WITH LUNCH     Cardiovascular: Calcium Channel Blockers 2 Failed - 01/08/2023 11:54 AM      Failed - Last BP in  normal range    BP Readings from Last 1 Encounters:  12/03/22 (!) 159/77         Failed - Valid encounter within last 6 months    Recent Outpatient Visits           6 months ago Annual physical exam   [place] [facility] Smitty Cords, DO   1 year ago RLS (restless legs syndrome)   [place] [facility] Smitty Cords, DO   1 year ago Benign hypertension with CKD (chronic kidney disease) stage III [facility])   [place] [facility] [place], Netta Neat, DO   1 year ago Benign hypertension with CKD (chronic kidney disease) stage III [facility])   [place] [facility] Smitty Cords, DO   1 year ago RUQ abdominal pain    [facility] Smitty Cords, DO       Future Appointments  In 5 months MacDiarmid, Lorin Picket, MD [facility] [place]            Passed - Last Heart Rate in normal range    Pulse Readings from Last 1 Encounters:  12/03/22 (!) 56          clonazePAM (KLONOPIN) 0.5 MG tablet [Pharmacy Med Name: CLONAZEPAM 0.5 MG TAB] 30 tablet     Sig: TAKE 1/2 TABLET BY MOUTH TWICE DAILY     Not Delegated - Psychiatry: Anxiolytics/Hypnotics 2 Failed - 01/08/2023 11:54 AM      Failed - This refill cannot be delegated      Failed - Urine Drug Screen completed in last 360 days      Failed - Valid encounter within last 6 months    Recent Outpatient Visits           6 months ago Annual physical exam   [place] [facility] Smitty Cords, DO   1 year ago RLS (restless legs syndrome)   [place] [facility] Smitty Cords, DO   1 year ago Benign hypertension with CKD (chronic kidney disease) stage III [facility])   [place] [facility] Smitty Cords, DO   1 year ago Benign hypertension with CKD (chronic kidney disease) stage III [facility])   [facility] Smitty Cords, DO   1 year ago RUQ abdominal pain   [place] [facility] Smitty Cords, DO       Future Appointments             In 5 months MacDiarmid, Lorin Picket, MD [facility] - Patient is not pregnant

## 2023-01-14 ENCOUNTER — Telehealth: Payer: Self-pay | Admitting: Pain Medicine

## 2023-01-14 NOTE — Telephone Encounter (Signed)
Daughter wants to know if Dr. Laban Emperor place an prn order foe her mother to come in to get another injection. She stated the last time she spoke with him that he was suppose to put order in. Daughter will like for mother to come in next week. Please give daughter a call. TY

## 2023-01-20 NOTE — Progress Notes (Unsigned)
PROVIDER NOTE: Interpretation of information contained herein should be left to medically-trained personnel. Specific patient instructions are provided elsewhere under "Patient Instructions" section of medical record. This document was created in part using STT-dictation technology, any transcriptional errors that may result from this process are unintentional.  Patient: Brittney Simpson Type: Established DOB: [birth year] MRN: [id number] PCP: Smitty Cords, DO  Service: Procedure DOS: 01/21/2023 Setting: Ambulatory Location: Ambulatory outpatient facility Delivery: Face-to-face Provider: Oswaldo Done, MD Specialty: Interventional Pain Management Specialty designation: 09 Location: Outpatient facility Ref. Prov.: Saralyn Pilar *       Interventional Therapy   Procedure: Lumbar epidural steroid injection (LESI) (interlaminar)  #9     Laterality: Right   Level:  T12-L1 Level.  Imaging: Fluoroscopic guidance         Anesthesia: Local anesthesia (1-2% Lidocaine) Anxiolysis: None                 Sedation:                         DOS: 01/21/2023  Performed by: Oswaldo Done, MD  Purpose: Diagnostic/Therapeutic Indications: Lumbar radicular pain of intraspinal etiology of more than 4 weeks that has failed to respond to conservative therapy and is severe enough to impact quality of life or function. 1. DDD (degenerative disc disease), thoracolumbar   2. Chronic low back pain (Right) w/ radicular pain (Right)   3. Chronic lower extremity pain (3ry area of Pain) (Right)   4. Chronic lumbar radicular pain (Right)   5. Abnormal MRI, lumbar spine (01/10/2017)   6. Spinal stenosis of lumbar region with neurogenic claudication   7. Spondylolisthesis of lumbosacral region (L2-3 and L5-S1)   8. T12 compression fracture, sequela    NAS-11 Pain score:   Pre-procedure: 9 /10   Post-procedure: 0-No pain/10      Position / Prep / Materials:  Position: Prone w/ head  of the table raised (slight reverse trendelenburg) to facilitate breathing.  Prep solution: DuraPrep (Iodine Povacrylex [0.7% available iodine] and Isopropyl Alcohol, 74% w/w) Prep Area: Entire Posterior Lumbar Region from lower scapular tip down to mid buttocks area and from flank to flank. Materials:  Tray: Epidural tray Needle(s):  Type: Epidural needle (Tuohy) Gauge (G):  17 Length: Regular (3.5-in) Qty: 1   Pre-op H&P Assessment:  Brittney Simpson is a 89 y.o. (year old), female patient, seen today for interventional treatment. She  has a past surgical history that includes Hernia repair; Hernia repair; Hemorrhoid surgery; Kyphoplasty (N/A, 01/23/2017); Kyphoplasty (N/A, 01/25/2020); and Cholecystectomy. Brittney Simpson has a current medication list which includes the following prescription(s): acetaminophen, amlodipine, brimonidine, vitamin d3, clonazepam, cranberry, furosemide, latanoprost, levothyroxine, lisinopril, melatonin, metoprolol succinate, omeprazole, tamsulosin, tramadol, trazodone, and methocarbamol. Her primarily concern today is the Back Pain (lower)  Initial Vital Signs:  Pulse/HCG Rate: (!) 56ECG Heart Rate: 60 Temp: (!) 97.5 F (36.4 C) Resp: 18 BP: (!) 144/66 SpO2: 96 %  BMI: Estimated body mass index is 20.6 kg/m as calculated from the following:   Height as of this encounter: 5\' 4"  (1.626 m).   Weight as of this encounter: 120 lb (54.4 kg).  Risk Assessment: Allergies: Reviewed. She has No Known Allergies.  Allergy Precautions: None required Coagulopathies: Reviewed. None identified.  Blood-thinner therapy: None at this time Active Infection(s): Reviewed. None identified. Brittney Simpson is afebrile  Site Confirmation: Brittney Simpson was asked to confirm the procedure and laterality before marking the site Procedure checklist:  Completed Consent: Before the procedure and under the influence of no sedative(s), amnesic(s), or anxiolytics, the patient was informed of  the treatment options, risks and possible complications. To fulfill our ethical and legal obligations, as recommended by the American Medical Association's Code of Ethics, I have informed the patient of my clinical impression; the nature and purpose of the treatment or procedure; the risks, benefits, and possible complications of the intervention; the alternatives, including doing nothing; the risk(s) and benefit(s) of the alternative treatment(s) or procedure(s); and the risk(s) and benefit(s) of doing nothing. The patient was provided information about the general risks and possible complications associated with the procedure. These may include, but are not limited to: failure to achieve desired goals, infection, bleeding, organ or nerve damage, allergic reactions, paralysis, and death. In addition, the patient was informed of those risks and complications associated to Spine-related procedures, such as failure to decrease pain; infection (i.e.: Meningitis, epidural or intraspinal abscess); bleeding (i.e.: epidural hematoma, subarachnoid hemorrhage, or any other type of intraspinal or peri-dural bleeding); organ or nerve damage (i.e.: Any type of peripheral nerve, nerve root, or spinal cord injury) with subsequent damage to sensory, motor, and/or autonomic systems, resulting in permanent pain, numbness, and/or weakness of one or several areas of the body; allergic reactions; (i.e.: anaphylactic reaction); and/or death. Furthermore, the patient was informed of those risks and complications associated with the medications. These include, but are not limited to: allergic reactions (i.e.: anaphylactic or anaphylactoid reaction(s)); adrenal axis suppression; blood sugar elevation that in diabetics may result in ketoacidosis or comma; water retention that in patients with history of congestive heart failure may result in shortness of breath, pulmonary edema, and decompensation with resultant heart failure; weight  gain; swelling or edema; medication-induced neural toxicity; particulate matter embolism and blood vessel occlusion with resultant organ, and/or nervous system infarction; and/or aseptic necrosis of one or more joints. Finally, the patient was informed that Medicine is not an exact science; therefore, there is also the possibility of unforeseen or unpredictable risks and/or possible complications that may result in a catastrophic outcome. The patient indicated having understood very clearly. We have given the patient no guarantees and we have made no promises. Enough time was given to the patient to ask questions, all of which were answered to the patient's satisfaction. Ms. Tisdell has indicated that she wanted to continue with the procedure. Attestation: I, the ordering provider, attest that I have discussed with the patient the benefits, risks, side-effects, alternatives, likelihood of achieving goals, and potential problems during recovery for the procedure that I have provided informed consent. Date  Time: 01/21/2023 10:27 AM   Pre-Procedure Preparation:  Monitoring: As per clinic protocol. Respiration, ETCO2, SpO2, BP, heart rate and rhythm monitor placed and checked for adequate function Safety Precautions: Patient was assessed for positional comfort and pressure points before starting the procedure. Time-out: I initiated and conducted the "Time-out" before starting the procedure, as per protocol. The patient was asked to participate by confirming the accuracy of the "Time Out" information. Verification of the correct person, site, and procedure were performed and confirmed by me, the nursing staff, and the patient. "Time-out" conducted as per Joint Commission's Universal Protocol (UP.01.01.01). Time: 1043 Start Time: 1043 hrs.  Description/Narrative of Procedure:          Target: Epidural space via interlaminar opening, initially targeting the lower laminar border of the superior vertebral  body. Region: Lumbar Approach: Percutaneous paravertebral  Rationale (medical necessity): procedure needed and proper for the  diagnosis and/or treatment of the patient's medical symptoms and needs. Procedural Technique Safety Precautions: Aspiration looking for blood return was conducted prior to all injections. At no point did we inject any substances, as a needle was being advanced. No attempts were made at seeking any paresthesias. Safe injection practices and needle disposal techniques used. Medications properly checked for expiration dates. SDV (single dose vial) medications used. Description of the Procedure: Protocol guidelines were followed. The procedure needle was introduced through the skin, ipsilateral to the reported pain, and advanced to the target area. Bone was contacted and the needle walked caudad, until the lamina was cleared. The epidural space was identified using "loss-of-resistance technique" with 2-3 ml of PF-NaCl (0.9% NSS), in a 5cc LOR glass syringe.  Vitals:   01/21/23 1025 01/21/23 1043 01/21/23 1050  BP: (!) 144/66 (!) 171/104 (!) 179/87  Pulse: (!) 56    Resp:  18 18  Temp: (!) 97.5 F (36.4 C)    SpO2: 96% 96% 95%  Weight: 120 lb (54.4 kg)    Height: 5\' 4"  (1.626 m)      Start Time: 1043 hrs. End Time: 1049 hrs.  Imaging Guidance (Spinal):          Type of Imaging Technique: Fluoroscopy Guidance (Spinal) Indication(s): Assistance in needle guidance and placement for procedures requiring needle placement in or near specific anatomical locations not easily accessible without such assistance. Exposure Time: Please see nurses notes. Contrast: Before injecting any contrast, we confirmed that the patient did not have an allergy to iodine, shellfish, or radiological contrast. Once satisfactory needle placement was completed at the desired level, radiological contrast was injected. Contrast injected under live fluoroscopy. No contrast complications. See chart for  type and volume of contrast used. Fluoroscopic Guidance: I was personally present during the use of fluoroscopy. "Tunnel Vision Technique" used to obtain the best possible view of the target area. Parallax error corrected before commencing the procedure. "Direction-depth-direction" technique used to introduce the needle under continuous pulsed fluoroscopy. Once target was reached, antero-posterior, oblique, and lateral fluoroscopic projection used confirm needle placement in all planes. Images permanently stored in EMR. Interpretation: I personally interpreted the imaging intraoperatively. Adequate needle placement confirmed in multiple planes. Appropriate spread of contrast into desired area was observed. No evidence of afferent or efferent intravascular uptake. No intrathecal or subarachnoid spread observed. Permanent images saved into the patient's record.  Antibiotic Prophylaxis:   Anti-infectives (From admission, onward)    None      Indication(s): None identified  Post-operative Assessment:  Post-procedure Vital Signs:  Pulse/HCG Rate: (!) 5662 Temp: (!) 97.5 F (36.4 C) Resp: 18 BP: (!) 179/87 SpO2: 95 %  EBL: None  Complications: No immediate post-treatment complications observed by team, or reported by patient.  Note: The patient tolerated the entire procedure well. A repeat set of vitals were taken after the procedure and the patient was kept under observation following institutional policy, for this type of procedure. Post-procedural neurological assessment was performed, showing return to baseline, prior to discharge. The patient was provided with post-procedure discharge instructions, including a section on how to identify potential problems. Should any problems arise concerning this procedure, the patient was given instructions to immediately contact us, at any time, without hesitation. In any case, we plan to contact the patient by telephone for a follow-up status report  regarding this interventional procedure.  Comments:  No additional relevant information.  Plan of Care (POC)  Orders:  Orders Placed This Encounter  Procedures  Lumbar Epidural Injection    Scheduling Instructions:     Procedure: Interlaminar LESI T12-L1     Laterality: Right     Sedation: No Sedation     Timeframe: Today    Order Specific Question:   Where will this procedure be performed?    Answer:   [facility]   DG [facility] C-ARM 1-60 MIN NO REPORT    Intraoperative interpretation by procedural physician at [facility].    Standing Status:   Standing    Number of Occurrences:   1    Order Specific Question:   Reason for exam:    Answer:   Assistance in needle guidance and placement for procedures requiring needle placement in or near specific anatomical locations not easily accessible without such assistance.   Informed Consent Details: Physician/Practitioner Attestation; Transcribe to consent form and obtain patient signature    Note: Always confirm laterality of pain with Ms. Cadavid, before procedure. Transcribe to consent form and obtain patient signature.    Order Specific Question:   Physician/Practitioner attestation of informed consent for procedure/surgical case    Answer:   I, the physician/practitioner, attest that I have discussed with the patient the benefits, risks, side effects, alternatives, likelihood of achieving goals and potential problems during recovery for the procedure that I have provided informed consent.    Order Specific Question:   Procedure    Answer:   Lumbar epidural steroid injection under fluoroscopic guidance    Order Specific Question:   Physician/Practitioner performing the procedure    Answer:   Dessie Tatem A. Laban Emperor, MD    Order Specific Question:   Indication/Reason    Answer:   Low back and/or lower extremity pain secondary to lumbar radiculitis   Provide equipment / supplies at bedside    Procedural tray:  Epidural Tray (Disposable  single use) Skin infiltration needle: Regular 1.5-in, 25-G, (x1) Block needle size: Regular standard Catheter: No catheter required    Standing Status:   Standing    Number of Occurrences:   1    Order Specific Question:   Specify    Answer:   Epidural Tray   Chronic Opioid Analgesic:  Tramadol 50 mg, 1 tab PO q 8 hrs (150 mg/day of tramadol) MME/day: 15 mg/day.   Medications ordered for procedure: Meds ordered this encounter  Medications   iohexol (OMNIPAQUE) 180 MG/ML injection 10 mL    Must be Myelogram-compatible. If not available, you may substitute with a water-soluble, non-ionic, hypoallergenic, myelogram-compatible radiological contrast medium.   lidocaine (XYLOCAINE) 2 % (with pres) injection 400 mg   pentafluoroprop-tetrafluoroeth (GEBAUERS) aerosol   sodium chloride flush (NS) 0.9 % injection 2 mL   ropivacaine (PF) 2 mg/mL (0.2%) (NAROPIN) injection 2 mL   triamcinolone acetonide (KENALOG-40) injection 40 mg   Medications administered: We administered iohexol, lidocaine, pentafluoroprop-tetrafluoroeth, sodium chloride flush, ropivacaine (PF) 2 mg/mL (0.2%), and triamcinolone acetonide.  See the medical record for exact dosing, route, and time of administration.  Follow-up plan:   Return in about 2 weeks (around 02/04/2023) for Proc-day (T,Th), (Face2F), (PPE).       Interventional Therapies  Risk  Complexity Considerations:   Advanced age  HOH  AAA  HTN  Stage3 CKD  GERD   Note: Hard of hearing   Planned  Pending:      Under consideration:   Palliative right T12-L1 LESI #10    Completed:   Palliative right T12-L1 LESI x9 (01/21/2023) (100/100/80/80)  Therapeutic right L2-3  LESI x1 (03/07/2020) (100/100/25/<50)  Diagnostic/therapeutic bilateral SI joint injection x1 (02/10/2020) (100/100/100 x2 days/0)  Palliative right lumbar facet MBB x13 (01/04/2020) (8 to 0) (100/100/100)  Palliative left lumbar facet MBB x11 (01/04/2020) (8 to  0) (100/100/100)    Therapeutic  Palliative (PRN) options:   Palliative right T12-L1 LESI (PRN)   Pharmacotherapy  Nonopioids transferred 04/11/2020: Robaxin       Recent Visits No visits were found meeting these conditions. Showing recent visits within past 90 days and meeting all other requirements Today's Visits Date Type Provider Dept  01/21/23 Procedure visit Delano Metz, MD [facility]  Showing today's visits and meeting all other requirements Future Appointments Date Type Provider Dept  02/18/23 Appointment Delano Metz, MD [facility]  04/07/23 Appointment Delano Metz, MD [facility]  Showing future appointments within next 90 days and meeting all other requirements  Disposition: Discharge home  Discharge (Date  Time): 01/21/2023; 1100 hrs.   Primary Care Physician: Smitty Cords, DO Location: [facility] Note by: Oswaldo Done, MD (TTS technology used. I apologize for any typographical errors that were not detected and corrected.) Date: 01/21/2023; Time: 11:00 AM  Disclaimer:  Medicine is not an Visual merchandiser. The only guarantee in medicine is that nothing is guaranteed. It is important to note that the decision to proceed with this intervention was based on the information collected from the patient. The Data and conclusions were drawn from the patient's questionnaire, the interview, and the physical examination. Because the information was provided in large part by the patient, it cannot be guaranteed that it has not been purposely or unconsciously manipulated. Every effort has been made to obtain as much relevant data as possible for this evaluation. It is important to note that the conclusions that lead to this procedure are derived in large part from the available data. Always take into account that the treatment will also be dependent on availability of resources and existing  treatment guidelines, considered by other Pain Management Practitioners as being common knowledge and practice, at the time of the intervention. For Medico-Legal purposes, it is also important to point out that variation in procedural techniques and pharmacological choices are the acceptable norm. The indications, contraindications, technique, and results of the above procedure should only be interpreted and judged by a Board-Certified Interventional Pain Specialist with extensive familiarity and expertise in the same exact procedure and technique.

## 2023-01-21 ENCOUNTER — Ambulatory Visit
Admission: RE | Admit: 2023-01-21 | Discharge: 2023-01-21 | Disposition: A | Discharge status: Home / Self Care | Payer: Medicare Other | Source: Ambulatory Visit | Attending: Pain Medicine | Admitting: Pain Medicine

## 2023-01-21 ENCOUNTER — Ambulatory Visit: Payer: Medicare Other | Attending: Pain Medicine | Admitting: Pain Medicine

## 2023-01-21 ENCOUNTER — Encounter: Payer: Self-pay | Admitting: Pain Medicine

## 2023-01-21 VITALS — BP 179/87 | HR 56 | Temp 97.5°F | Resp 18 | Ht 64.0 in | Wt 120.0 lb

## 2023-01-21 DIAGNOSIS — M79604 Pain in right leg: Secondary | ICD-10-CM | POA: Insufficient documentation

## 2023-01-21 DIAGNOSIS — R937 Abnormal findings on diagnostic imaging of other parts of musculoskeletal system: Secondary | ICD-10-CM | POA: Insufficient documentation

## 2023-01-21 DIAGNOSIS — M48062 Spinal stenosis, lumbar region with neurogenic claudication: Secondary | ICD-10-CM | POA: Insufficient documentation

## 2023-01-21 DIAGNOSIS — M5416 Radiculopathy, lumbar region: Secondary | ICD-10-CM | POA: Diagnosis not present

## 2023-01-21 DIAGNOSIS — M4317 Spondylolisthesis, lumbosacral region: Secondary | ICD-10-CM | POA: Diagnosis not present

## 2023-01-21 DIAGNOSIS — M5441 Lumbago with sciatica, right side: Secondary | ICD-10-CM | POA: Insufficient documentation

## 2023-01-21 DIAGNOSIS — S22080S Wedge compression fracture of T11-T12 vertebra, sequela: Secondary | ICD-10-CM | POA: Diagnosis not present

## 2023-01-21 DIAGNOSIS — G8929 Other chronic pain: Secondary | ICD-10-CM | POA: Diagnosis not present

## 2023-01-21 DIAGNOSIS — M5135 Other intervertebral disc degeneration, thoracolumbar region: Secondary | ICD-10-CM | POA: Insufficient documentation

## 2023-01-21 MED ORDER — LIDOCAINE HCL (PF) 1 % IJ SOLN
INTRAMUSCULAR | Status: AC
  Filled 2023-01-21: qty 10

## 2023-01-21 MED ORDER — TRIAMCINOLONE ACETONIDE 40 MG/ML IJ SUSP
INTRAMUSCULAR | Status: AC
  Filled 2023-01-21: qty 1

## 2023-01-21 MED ORDER — ROPIVACAINE HCL 2 MG/ML IJ SOLN
INTRAMUSCULAR | Status: AC
  Filled 2023-01-21: qty 20

## 2023-01-21 MED ORDER — TRIAMCINOLONE ACETONIDE 40 MG/ML IJ SUSP
40.0000 mg | Freq: Once | INTRAMUSCULAR | Status: AC
  Administered 2023-01-21: 40 mg

## 2023-01-21 MED ORDER — ROPIVACAINE HCL 2 MG/ML IJ SOLN
2.0000 mL | Freq: Once | INTRAMUSCULAR | Status: AC
  Administered 2023-01-21: 2 mL via EPIDURAL

## 2023-01-21 MED ORDER — IOHEXOL 180 MG/ML  SOLN
10.0000 mL | Freq: Once | INTRAMUSCULAR | Status: AC
  Administered 2023-01-21: 10 mL via EPIDURAL

## 2023-01-21 MED ORDER — SODIUM CHLORIDE 0.9% FLUSH
2.0000 mL | Freq: Once | INTRAVENOUS | Status: AC
  Administered 2023-01-21: 2 mL

## 2023-01-21 MED ORDER — IOHEXOL 180 MG/ML  SOLN
INTRAMUSCULAR | Status: AC
  Filled 2023-01-21: qty 20

## 2023-01-21 MED ORDER — SODIUM CHLORIDE (PF) 0.9 % IJ SOLN
INTRAMUSCULAR | Status: AC
  Filled 2023-01-21: qty 10

## 2023-01-21 MED ORDER — PENTAFLUOROPROP-TETRAFLUOROETH EX AERO
INHALATION_SPRAY | Freq: Once | CUTANEOUS | Status: AC
  Administered 2023-01-21: 30 via TOPICAL
  Filled 2023-01-21: qty 30

## 2023-01-21 MED ORDER — LIDOCAINE HCL 2 % IJ SOLN
20.0000 mL | Freq: Once | INTRAMUSCULAR | Status: AC
  Administered 2023-01-21: 100 mg

## 2023-01-21 NOTE — Patient Instructions (Signed)
Pain Management Discharge Instructions  General Discharge Instructions :  If you need to reach your doctor call: Monday-Friday 8:00 am - 4:00 pm at [phone] or toll free [phone].  After clinic hours [phone] to have operator reach doctor.  Bring all of your medication bottles to all your appointments in the pain clinic.  To cancel or reschedule your appointment with Pain Management please remember to call 24 hours in advance to avoid a fee.  Refer to the educational materials which you have been given on: General Risks, I had my Procedure. Discharge Instructions, Post Sedation.  Post Procedure Instructions:  The drugs you were given will stay in your system until tomorrow, so for the next 24 hours you should not drive, make any legal decisions or drink any alcoholic beverages.  You may eat anything you prefer, but it is better to start with liquids then soups and crackers, and gradually work up to solid foods.  Please notify your doctor immediately if you have any unusual bleeding, trouble breathing or pain that is not related to your normal pain.  Depending on the type of procedure that was done, some parts of your body may feel week and/or numb.  This usually clears up by tonight or the next day.  Walk with the use of an assistive device or accompanied by an adult for the 24 hours.  You may use ice on the affected area for the first 24 hours.  Put ice in a Ziploc bag and cover with a towel and place against area 15 minutes on 15 minutes off.  You may switch to heat after 24 hours.Epidural Steroid Injection Patient Information  Description: The epidural space surrounds the nerves as they exit the spinal cord.  In some patients, the nerves can be compressed and inflamed by a bulging disc or a tight spinal canal (spinal stenosis).  By injecting steroids into the epidural space, we can bring irritated nerves into direct contact with a potentially helpful medication.  These  steroids act directly on the irritated nerves and can reduce swelling and inflammation which often leads to decreased pain.  Epidural steroids may be injected anywhere along the spine and from the neck to the low back depending upon the location of your pain.   After numbing the skin with local anesthetic (like Novocaine), a small needle is passed into the epidural space slowly.  You may experience a sensation of pressure while this is being done.  The entire block usually last less than 10 minutes.  Conditions which may be treated by epidural steroids:  Low back and leg pain Neck and arm pain Spinal stenosis Post-laminectomy syndrome Herpes zoster (shingles) pain Pain from compression fractures  Preparation for the injection:  Do not eat any solid food or dairy products within 8 hours of your appointment.  You may drink clear liquids up to 3 hours before appointment.  Clear liquids include water, black coffee, juice or soda.  No milk or cream please. You may take your regular medication, including pain medications, with a sip of water before your appointment  Diabetics should hold regular insulin (if taken separately) and take 1/2 normal NPH dos the morning of the procedure.  Carry some sugar containing items with you to your appointment. A driver must accompany you and be prepared to drive you home after your procedure.  Bring all your current medications with your. An IV may be inserted and sedation may be given at the discretion of the physician.     A blood pressure cuff, EKG and other monitors will often be applied during the procedure.  Some patients may need to have extra oxygen administered for a short period. You will be asked to provide medical information, including your allergies, prior to the procedure.  We must know immediately if you are taking blood thinners (like Coumadin/Warfarin)  Or if you are allergic to IV iodine contrast (dye). We must know if you could possible be  pregnant.  Possible side-effects: Bleeding from needle site Infection (rare, may require surgery) Nerve injury (rare) Numbness & tingling (temporary) Difficulty urinating (rare, temporary) Spinal headache ( a headache worse with upright posture) Light -headedness (temporary) Pain at injection site (several days) Decreased blood pressure (temporary) Weakness in arm/leg (temporary) Pressure sensation in back/neck (temporary)  Call if you experience: Fever/chills associated with headache or increased back/neck pain. Headache worsened by an upright position. New onset weakness or numbness of an extremity below the injection site Hives or difficulty breathing (go to the emergency room) Inflammation or drainage at the infection site Severe back/neck pain Any new symptoms which are concerning to you  Please note:  Although the local anesthetic injected can often make your back or neck feel good for several hours after the injection, the pain will likely return.  It takes 3-7 days for steroids to work in the epidural space.  You may not notice any pain relief for at least that one week.  If effective, we will often do a series of three injections spaced 3-6 weeks apart to maximally decrease your pain.  After the initial series, we generally will wait several months before considering a repeat injection of the same type.  If you have any questions, please call [phone]  [facility] 

## 2023-01-21 NOTE — Addendum Note (Signed)
Addended by: Delano Metz A on: 01/21/2023 11:00 AM   Modules accepted: Orders

## 2023-01-22 ENCOUNTER — Telehealth: Payer: Self-pay

## 2023-01-22 NOTE — Telephone Encounter (Signed)
Post procedure follow up.  Spoke with daughter.  She states that she will be calling [facility] in a little while and will call us for any questions or concerns.

## 2023-02-05 ENCOUNTER — Other Ambulatory Visit: Payer: Self-pay | Admitting: Family Medicine

## 2023-02-05 DIAGNOSIS — N183 Chronic kidney disease, stage 3 unspecified: Secondary | ICD-10-CM

## 2023-02-06 NOTE — Telephone Encounter (Signed)
OV needed for additional refills.  Requested Prescriptions  Pending Prescriptions Disp Refills   furosemide (LASIX) 20 MG tablet [Pharmacy Med Name: FUROSEMIDE 20 MG TAB] 45 tablet 0    Sig: TAKE 1 and 1/2 TABLETS BY MOUTH ONCE DAILY     Cardiovascular:  Diuretics - Loop Failed - 02/05/2023  3:47 PM      Failed - K in normal range and within 180 days    Potassium  Date Value Ref Range Status  06/28/2022 4.2 3.5 - 5.3 mmol/L Final  11/24/2014 4.2 mmol/L Final    Comment:    3.5-5.1 NOTE: New Reference Range  10/04/14          Failed - Ca in normal range and within 180 days    Calcium  Date Value Ref Range Status  06/28/2022 9.3 8.6 - 10.4 mg/dL Final   Calcium, Total  Date Value Ref Range Status  11/24/2014 8.9 mg/dL Final    Comment:    4.0-98.1 NOTE: New Reference Range  10/04/14          Failed - Na in normal range and within 180 days    Sodium  Date Value Ref Range Status  06/28/2022 137 135 - 146 mmol/L Final  11/24/2014 131 (L) mmol/L Final    Comment:    135-145 NOTE: New Reference Range  10/04/14          Failed - Cr in normal range and within 180 days    Creat  Date Value Ref Range Status  06/28/2022 1.01 (H) 0.60 - 0.95 mg/dL Final         Failed - Cl in normal range and within 180 days    Chloride  Date Value Ref Range Status  06/28/2022 100 98 - 110 mmol/L Final  11/24/2014 97 (L) mmol/L Final    Comment:    101-111 NOTE: New Reference Range  10/04/14          Failed - Mg Level in normal range and within 180 days    Magnesium  Date Value Ref Range Status  01/14/2014 2.1 mg/dL Final    Comment:    1.9-1.4 THERAPEUTIC RANGE: 4-7 mg/dL TOXIC: > 10 mg/dL  -----------------------          Failed - Last BP in normal range    BP Readings from Last 1 Encounters:  01/21/23 (!) 179/87         Failed - Valid encounter within last 6 months    Recent Outpatient Visits           7 months ago Annual physical exam   [place] [facility] Smitty Cords, DO   1 year ago RLS (restless legs syndrome)   [place] [facility] Smitty Cords, DO   1 year ago Benign hypertension with CKD (chronic kidney disease) stage III [facility])   [place] [facility] Smitty Cords, DO   1 year ago Benign hypertension with CKD (chronic kidney disease) stage III [facility])   [place] [facility] Smitty Cords, DO   1 year ago RUQ abdominal pain   [place] [facility] Smitty Cords, DO       Future Appointments             In 4 months MacDiarmid, Lorin Picket, MD [facility] Urology [facility]

## 2023-02-17 NOTE — Progress Notes (Unsigned)
PROVIDER NOTE: Information contained herein reflects review and annotations entered in association with encounter. Interpretation of such information and data should be left to medically-trained personnel. Information provided to patient can be located elsewhere in the medical record under "Patient Instructions". Document created using STT-dictation technology, any transcriptional errors that may result from process are unintentional.    Patient: Brittney Simpson  Service Category: E/M  Provider: Oswaldo Done, MD  DOB: 1927/09/01  DOS: 02/18/2023  Referring Provider: Saralyn Pilar *  MRN: [id number]  Specialty: Interventional Pain Management  PCP: Smitty Cords, DO  Type: Established Patient  Setting: Ambulatory outpatient    Location: Office  Delivery: Face-to-face     HPI  [name], a 89 y.o. year old female, is here today because of her No primary diagnosis found.. [name] primary complain today is No chief complaint on file.  Pertinent problems: [name] has Lumbar facet syndrome (Bilateral) (R>L); Chronic low back pain (1ry area of Pain) (Bilateral) (R>L) w/o sciatica; Spondylolisthesis of lumbosacral region (L2-3 and L5-S1); Lumbar spinal stenosis (9 mm at L3-4); Trochanteric bursitis (Right); Chronic hip pain (2ry area of Pain) (Right); Osteoarthritis of hip (Right); Chronic sacroiliac joint pain (Bilateral) (R>L); Chronic lumbar radicular pain (Right); Chronic lower extremity pain (3ry area of Pain) (Right); Myofascial pain; Spondylosis without myelopathy or radiculopathy, lumbosacral region; Ankle edema, bilateral; Abnormal MRI, lumbar spine (01/10/2017); T12 compression fracture, sequela; Lumbar facet hypertrophy; Lumbar lateral recess stenosis; Lumbar foraminal stenosis; Non-traumatic compression fracture of T7 thoracic vertebra, sequela; Chronic pain syndrome; DDD (degenerative disc disease), lumbosacral; Edema of lower extremity; Somatic  dysfunction of sacroiliac joints (Bilateral); Other spondylosis, sacral and sacrococcygeal region; Spasm of back muscles; DDD (degenerative disc disease), thoracolumbar; and Chronic low back pain (Right) w/ radicular pain (Right) on their pertinent problem list. Pain Assessment: Severity of   is reported as a  /10. Location:    / . Onset:  . Quality:  . Timing:  . Modifying factor(s):  Marland Kitchen Vitals:  vitals were not taken for this visit.  BMI: Estimated body mass index is 20.6 kg/m as calculated from the following:   Height as of 01/21/23: 5\' 4"  (1.626 m).   Weight as of 01/21/23: 120 lb (54.4 kg). Last encounter: 10/10/2022. Last procedure: 01/21/2023.  Reason for encounter: post-procedure evaluation and assessment. ***  Post-procedure evaluation   Procedure: Lumbar epidural steroid injection (LESI) (interlaminar)  #9     Laterality: Right   Level:  T12-L1 Level.  Imaging: Fluoroscopic guidance         Anesthesia: Local anesthesia (1-2% Lidocaine) Anxiolysis: None                 Sedation:                         DOS: 01/21/2023  Performed by: Oswaldo Done, MD  Purpose: Diagnostic/Therapeutic Indications: Lumbar radicular pain of intraspinal etiology of more than 4 weeks that has failed to respond to conservative therapy and is severe enough to impact quality of life or function. 1. DDD (degenerative disc disease), thoracolumbar   2. Chronic low back pain (Right) w/ radicular pain (Right)   3. Chronic lower extremity pain (3ry area of Pain) (Right)   4. Chronic lumbar radicular pain (Right)   5. Abnormal MRI, lumbar spine (01/10/2017)   6. Spinal stenosis of lumbar region with neurogenic claudication   7. Spondylolisthesis of lumbosacral region (L2-3 and L5-S1)   8.  T12 compression fracture, sequela    NAS-11 Pain score:   Pre-procedure: 9 /10   Post-procedure: 0-No pain/10      Effectiveness:  Initial hour after procedure:   ***. Subsequent 4-6 hours post-procedure:    ***. Analgesia past initial 6 hours:   ***. Ongoing improvement:  Analgesic:  *** Function:    ***    ROM:    ***     Pharmacotherapy Assessment  Analgesic: Tramadol 50 mg, 1 tab PO q 8 hrs (150 mg/day of tramadol) MME/day: 15 mg/day.   Monitoring: [place] PMP: PDMP reviewed during this encounter.       Pharmacotherapy: No side-effects or adverse reactions reported. Compliance: No problems identified. Effectiveness: Clinically acceptable.  No notes on file  No results found for: "CBDTHCR" No results found for: "D8THCCBX" No results found for: "D9THCCBX"  UDS:  Summary  Date Value Ref Range Status  04/10/2022 Note  Final    Comment:    ==================================================================== [employer] (MW) ==================================================================== Specimen Alert Note: Urinary creatinine is low; ability to detect some drugs may be compromised. Interpret results with caution. (Creatinine) ==================================================================== Test                             Result       Flag       Units  Drug Present and Declared for Prescription Verification   7-aminoclonazepam              310          EXPECTED   ng/mg creat    7-aminoclonazepam is an expected metabolite of clonazepam. Source of    clonazepam is a scheduled prescription medication.    Tramadol                       >50000       EXPECTED   ng/mg creat   O-Desmethyltramadol            30510        EXPECTED   ng/mg creat   N-Desmethyltramadol            7260         EXPECTED   ng/mg creat    Source of tramadol is a prescription medication. O-desmethyltramadol    and N-desmethyltramadol are expected metabolites of tramadol.  ==================================================================== Test                      Result    Flag   Units      Ref Range   Creatinine              10        LL     mg/dL       >=16 ==================================================================== Declared Medications:  The flagging and interpretation on this report are based on the  following declared medications.  Unexpected results may arise from  inaccuracies in the declared medications.   **Note: The testing scope of this panel includes these medications:   Clonazepam (Klonopin)  Tramadol (Ultram)   **Note: The testing scope of this panel does not include the  following reported medications:   Acetaminophen (Tylenol)  Amlodipine (Norvasc)  Amoxicillin (Amoxil)  Brimonidine (Alphagan)  Cranberry  Eye Drop  Furosemide (Lasix)  Levothyroxine (Synthroid)  Lisinopril (Zestril)  Melatonin  Methocarbamol (Robaxin)  Metoprolol (Toprol)  Omeprazole (Prilosec)  Sulfamethoxazole (Bactrim)  Tamsulosin (Flomax)  Trazodone (  Desyrel)  Trimethoprim (Bactrim)  Vitamin D3 ==================================================================== For clinical consultation, please call [phone]. ====================================================================       ROS  Constitutional: Denies any fever or chills Gastrointestinal: No reported hemesis, hematochezia, vomiting, or acute GI distress Musculoskeletal: Denies any acute onset joint swelling, redness, loss of ROM, or weakness Neurological: No reported episodes of acute onset apraxia, aphasia, dysarthria, agnosia, amnesia, paralysis, loss of coordination, or loss of consciousness  Medication Review  Cranberry, Melatonin, Vitamin D3, acetaminophen, amLODipine, brimonidine, clonazePAM, furosemide, latanoprost, levothyroxine, lisinopril, methocarbamol, metoprolol succinate, omeprazole, tamsulosin, traMADol, and traZODone  History Review  Allergy: Ms. Radu has No Known Allergies. Drug: Ms. Shackleford  reports no history of drug use. Alcohol:  reports no history of alcohol use. Tobacco:  reports that she has never smoked. She has never used  smokeless tobacco. Social: Ms. Fussner  reports that she has never smoked. She has never used smokeless tobacco. She reports that she does not drink alcohol and does not use drugs. Medical:  has a past medical history of Anxiety, Arthritis, Back ache, Hiatal hernia, Hyperlipidemia, Hypertension, Hypothyroid, Osteopenia, Thyroid disease, and UTI (urinary tract infection). Surgical: Ms. Cozza  has a past surgical history that includes Hernia repair; Hernia repair; Hemorrhoid surgery; Kyphoplasty (N/A, 01/23/2017); Kyphoplasty (N/A, 01/25/2020); and Cholecystectomy. Family: family history includes Cancer in her father; Stroke in her mother.  Laboratory Chemistry Profile   Renal Lab Results  Component Value Date   BUN 13 06/28/2022   CREATININE 1.01 (H) 06/28/2022   BCR 13 06/28/2022   GFRAA 76 05/16/2020   GFRNONAA 66 05/16/2020    Hepatic Lab Results  Component Value Date   AST 9 (L) 06/28/2022   ALT 7 06/28/2022   ALBUMIN 3.6 11/22/2016   ALKPHOS 59 11/22/2016   LIPASE 21 06/14/2016    Electrolytes Lab Results  Component Value Date   NA 137 06/28/2022   K 4.2 06/28/2022   CL 100 06/28/2022   CALCIUM 9.3 06/28/2022   MG 2.1 01/14/2014    Bone No results found for: "VD25OH", "VD125OH2TOT", "BJ4782NF6", "OZ3086VH8", "25OHVITD1", "25OHVITD2", "25OHVITD3", "TESTOFREE", "TESTOSTERONE"  Inflammation (CRP: Acute Phase) (ESR: Chronic Phase) No results found for: "CRP", "ESRSEDRATE", "LATICACIDVEN"       Note: Above Lab results reviewed.  Recent Imaging Review  DG PAIN CLINIC C-ARM 1-60 MIN NO REPORT Fluoro was used, but no Radiologist interpretation will be provided.  Please refer to "NOTES" tab for provider progress note. Note: Reviewed        Physical Exam  General appearance: Well nourished, well developed, and well hydrated. In no apparent acute distress Mental status: Alert, oriented x 3 (person, place, & time)       Respiratory: No evidence of acute respiratory  distress Eyes: PERLA Vitals: There were no vitals taken for this visit. BMI: Estimated body mass index is 20.6 kg/m as calculated from the following:   Height as of 01/21/23: 5\' 4"  (1.626 m).   Weight as of 01/21/23: 120 lb (54.4 kg). Ideal: Patient weight not recorded  Assessment   Diagnosis Status  No diagnosis found. Controlled Controlled Controlled   Updated Problems: No problems updated.  Plan of Care  Problem-specific:  No problem-specific Assessment & Plan notes found for this encounter.  Ms. UCHECHI DENISON has a current medication list which includes the following long-term medication(s): amlodipine, clonazepam, furosemide, levothyroxine, lisinopril, methocarbamol, metoprolol succinate, omeprazole, tramadol, and trazodone.  Pharmacotherapy (Medications Ordered): No orders of the defined types were placed in this encounter.  Orders:  No orders of the defined types were placed in this encounter.  Follow-up plan:   No follow-ups on file.      Interventional Therapies  Risk  Complexity Considerations:   Advanced age  HOH  AAA  HTN  Stage3 CKD  GERD   Note: Hard of hearing   Planned  Pending:      Under consideration:   Palliative right T12-L1 LESI #10    Completed:   Palliative right T12-L1 LESI x9 (01/21/2023) (100/100/80/80)  Therapeutic right L2-3 LESI x1 (03/07/2020) (100/100/25/<50)  Diagnostic/therapeutic bilateral SI joint injection x1 (02/10/2020) (100/100/100 x2 days/0)  Palliative right lumbar facet MBB x13 (01/04/2020) (8 to 0) (100/100/100)  Palliative left lumbar facet MBB x11 (01/04/2020) (8 to 0) (100/100/100)    Therapeutic  Palliative (PRN) options:   Palliative right T12-L1 LESI (PRN)   Pharmacotherapy  Nonopioids transferred 04/11/2020: Robaxin        Recent Visits Date Type Provider Dept  01/21/23 Procedure visit Delano Metz, MD [facility]  Showing recent visits within past 90 days and meeting all other  requirements Future Appointments Date Type Provider Dept  02/18/23 Appointment Delano Metz, MD [facility]  04/07/23 Appointment Delano Metz, MD [facility]  Showing future appointments within next 90 days and meeting all other requirements  I discussed the assessment and treatment plan with the patient. The patient was provided an opportunity to ask questions and all were answered. The patient agreed with the plan and demonstrated an understanding of the instructions.  Patient advised to call back or seek an in-person evaluation if the symptoms or condition worsens.  Duration of encounter: *** minutes.  Total time on encounter, as per AMA guidelines included both the face-to-face and non-face-to-face time personally spent by the physician and/or other qualified health care professional(s) on the day of the encounter (includes time in activities that require the physician or other qualified health care professional and does not include time in activities normally performed by clinical staff). Physician's time may include the following activities when performed: Preparing to see the patient (e.g., pre-charting review of records, searching for previously ordered imaging, lab work, and nerve conduction tests) Review of prior analgesic pharmacotherapies. Reviewing PMP Interpreting ordered tests (e.g., lab work, imaging, nerve conduction tests) Performing post-procedure evaluations, including interpretation of diagnostic procedures Obtaining and/or reviewing separately obtained history Performing a medically appropriate examination and/or evaluation Counseling and educating the patient/family/caregiver Ordering medications, tests, or procedures Referring and communicating with other health care professionals (when not separately reported) Documenting clinical information in the electronic or other health record Independently interpreting results (not separately  reported) and communicating results to the patient/ family/caregiver Care coordination (not separately reported)  Note by: Oswaldo Done, MD Date: 02/18/2023; Time: 4:41 PM

## 2023-02-18 ENCOUNTER — Ambulatory Visit (HOSPITAL_BASED_OUTPATIENT_CLINIC_OR_DEPARTMENT_OTHER): Payer: BLUE CROSS/BLUE SHIELD | Admitting: Pain Medicine

## 2023-02-18 DIAGNOSIS — Z09 Encounter for follow-up examination after completed treatment for conditions other than malignant neoplasm: Secondary | ICD-10-CM

## 2023-02-18 DIAGNOSIS — Z91199 Patient's noncompliance with other medical treatment and regimen due to unspecified reason: Secondary | ICD-10-CM

## 2023-02-19 ENCOUNTER — Other Ambulatory Visit: Payer: Self-pay | Admitting: Family Medicine

## 2023-02-19 DIAGNOSIS — K219 Gastro-esophageal reflux disease without esophagitis: Secondary | ICD-10-CM

## 2023-02-19 DIAGNOSIS — F5101 Primary insomnia: Secondary | ICD-10-CM

## 2023-02-20 NOTE — Telephone Encounter (Signed)
Requested Prescriptions  Pending Prescriptions Disp Refills   traZODone (DESYREL) 50 MG tablet [Pharmacy Med Name: TRAZODONE HCL 50 MG TAB] 90 tablet 0    Sig: TAKE 1 TABLET BY MOUTH AT BEDTIME     Psychiatry: Antidepressants - Serotonin Modulator Failed - 02/19/2023  1:22 PM      Failed - Valid encounter within last 6 months    Recent Outpatient Visits           8 months ago Annual physical exam   [place] [facility] Smitty Cords, DO   1 year ago RLS (restless legs syndrome)   [place] [facility] Smitty Cords, DO   1 year ago Benign hypertension with CKD (chronic kidney disease) stage III [facility])   [place] [facility] [place], Netta Neat, DO   1 year ago Benign hypertension with CKD (chronic kidney disease) stage III [facility])   [place] [facility] Smitty Cords, DO   1 year ago RUQ abdominal pain   [place] [facility] Smitty Cords, DO       Future Appointments             In 3 months MacDiarmid, Lorin Picket, MD [facility] Urology [place]             omeprazole (PRILOSEC) 20 MG capsule [Pharmacy Med Name: OMEPRAZOLE DR 20 MG CAP] 90 capsule 1    Sig: TAKE 1 CAPSULE BY MOUTH ONCE DAILY BEFORE BREAKFAST     Gastroenterology: Proton Pump Inhibitors Passed - 02/19/2023  1:22 PM      Passed - Valid encounter within last 12 months    Recent Outpatient Visits           8 months ago Annual physical exam   [place] [facility] Smitty Cords, DO   1 year ago RLS (restless legs syndrome)   [place] [facility] Smitty Cords, DO   1 year ago Benign hypertension with CKD (chronic kidney disease) stage III [facility])   [place] [facility] [place], Netta Neat, DO   1 year ago Benign hypertension with CKD (chronic kidney disease) stage III [facility])   [place]  [facility] Smitty Cords, DO   1 year ago RUQ abdominal pain   [place] [facility] Smitty Cords, DO       Future Appointments             In 3 months MacDiarmid, Lorin Picket, MD [facility] Urology [facility]

## 2023-02-20 NOTE — Telephone Encounter (Signed)
Requested medications are due for refill today.  yes  Requested medications are on the active medications list.  yes  Last refill. 10/30/2022 #90 0 rf  Future visit scheduled.   no  Notes to clinic.  Unclear from OV note of 06/24/2022 if pt is to rtc in 6 months or 1 year.    Requested Prescriptions  Pending Prescriptions Disp Refills   traZODone (DESYREL) 50 MG tablet [Pharmacy Med Name: TRAZODONE HCL 50 MG TAB] 90 tablet 0    Sig: TAKE 1 TABLET BY MOUTH AT BEDTIME     Psychiatry: Antidepressants - Serotonin Modulator Failed - 02/19/2023  1:22 PM      Failed - Valid encounter within last 6 months    Recent Outpatient Visits           8 months ago Annual physical exam   [place] [facility] Smitty Cords, DO   1 year ago RLS (restless legs syndrome)   [place] [facility] Smitty Cords, DO   1 year ago Benign hypertension with CKD (chronic kidney disease) stage III [facility])   [place] [facility] [place], Netta Neat, DO   1 year ago Benign hypertension with CKD (chronic kidney disease) stage III [facility])   [place] [facility] Smitty Cords, DO   1 year ago RUQ abdominal pain   [place] [facility] Smitty Cords, DO       Future Appointments             In 3 months MacDiarmid, Lorin Picket, MD [facility] Urology             Signed Prescriptions Disp Refills   omeprazole (PRILOSEC) 20 MG capsule 90 capsule 1    Sig: TAKE 1 CAPSULE BY MOUTH ONCE DAILY BEFORE BREAKFAST     Gastroenterology: Proton Pump Inhibitors Passed - 02/19/2023  1:22 PM      Passed - Valid encounter within last 12 months    Recent Outpatient Visits           8 months ago Annual physical exam   [place] [facility] Smitty Cords, DO   1 year ago RLS (restless legs syndrome)   [place] [facility]  Smitty Cords, DO   1 year ago Benign hypertension with CKD (chronic kidney disease) stage III [facility])   [place] [facility] [place], Netta Neat, DO   1 year ago Benign hypertension with CKD (chronic kidney disease) stage III [facility])   [place] [facility] Smitty Cords, DO   1 year ago RUQ abdominal pain   [place] [facility] Smitty Cords, DO       Future Appointments             In 3 months MacDiarmid, Lorin Picket, MD [facility] Urology [facility]

## 2023-03-03 NOTE — Progress Notes (Unsigned)
PROVIDER NOTE: Information contained herein reflects review and annotations entered in association with encounter. Interpretation of such information and data should be left to medically-trained personnel. Information provided to patient can be located elsewhere in the medical record under "Patient Instructions". Document created using STT-dictation technology, any transcriptional errors that may result from process are unintentional.    Patient: Brittney Simpson  Service Category: E/M  Provider: Oswaldo Done, MD  DOB: [birth year]  DOS: 03/04/2023  Referring Provider: Saralyn Pilar *  MRN: [id number]  Specialty: Interventional Pain Management  PCP: Smitty Cords, DO  Type: Established Patient  Setting: Ambulatory outpatient    Location: Office  Delivery: Face-to-face     HPI  Brittney Simpson, a 89 y.o. year old female, is here today because of her Chronic right-sided low back pain with right-sided sciatica [M54.41, G89.29]. Ms. Peaches primary complain today is No chief complaint on file.  Pertinent problems: Brittney Simpson has Lumbar facet syndrome (Bilateral) (R>L); Chronic low back pain (1ry area of Pain) (Bilateral) (R>L) w/o sciatica; Spondylolisthesis of lumbosacral region (L2-3 and L5-S1); Lumbar spinal stenosis (9 mm at L3-4); Trochanteric bursitis (Right); Chronic hip pain (2ry area of Pain) (Right); Osteoarthritis of hip (Right); Chronic sacroiliac joint pain (Bilateral) (R>L); Chronic lumbar radicular pain (Right); Chronic lower extremity pain (3ry area of Pain) (Right); Myofascial pain; Spondylosis without myelopathy or radiculopathy, lumbosacral region; Ankle edema, bilateral; Abnormal MRI, lumbar spine (01/10/2017); T12 compression fracture, sequela; Lumbar facet hypertrophy; Lumbar lateral recess stenosis; Lumbar foraminal stenosis; Non-traumatic compression fracture of T7 thoracic vertebra, sequela; Chronic pain syndrome; DDD (degenerative disc disease),  lumbosacral; Edema of lower extremity; Somatic dysfunction of sacroiliac joints (Bilateral); Other spondylosis, sacral and sacrococcygeal region; Spasm of back muscles; DDD (degenerative disc disease), thoracolumbar; and Chronic low back pain (Right) w/ radicular pain (Right) on their pertinent problem list. Pain Assessment: Severity of   is reported as a  /10. Location:    / . Onset:  . Quality:  . Timing:  . Modifying factor(s):  Marland Kitchen Vitals:  vitals were not taken for this visit.  BMI: Estimated body mass index is 20.6 kg/m as calculated from the following:   Height as of 01/21/23: 5\' 4"  (1.626 m).   Weight as of 01/21/23: 120 lb (54.4 kg). Last encounter: 02/18/2023. Last procedure: 01/21/2023.  Reason for encounter: post-procedure evaluation and assessment. ***  Post-procedure evaluation   Procedure: Lumbar epidural steroid injection (LESI) (interlaminar)  #9     Laterality: Right   Level:  T12-L1 Level.  Imaging: Fluoroscopic guidance         Anesthesia: Local anesthesia (1-2% Lidocaine) Anxiolysis: None                 Sedation:                         DOS: 01/21/2023  Performed by: Oswaldo Done, MD  Purpose: Diagnostic/Therapeutic Indications: Lumbar radicular pain of intraspinal etiology of more than 4 weeks that has failed to respond to conservative therapy and is severe enough to impact quality of life or function. 1. DDD (degenerative disc disease), thoracolumbar   2. Chronic low back pain (Right) w/ radicular pain (Right)   3. Chronic lower extremity pain (3ry area of Pain) (Right)   4. Chronic lumbar radicular pain (Right)   5. Abnormal MRI, lumbar spine (01/10/2017)   6. Spinal stenosis of lumbar region with neurogenic claudication   7. Spondylolisthesis of lumbosacral region (  L2-3 and L5-S1)   8. T12 compression fracture, sequela    NAS-11 Pain score:   Pre-procedure: 9 /10   Post-procedure: 0-No pain/10      Effectiveness:  Initial hour after procedure:    ***. Subsequent 4-6 hours post-procedure:   ***. Analgesia past initial 6 hours:   ***. Ongoing improvement:  Analgesic:  *** Function:    ***    ROM:    ***     Pharmacotherapy Assessment  Analgesic: Tramadol 50 mg, 1 tab PO q 8 hrs (150 mg/day of tramadol) MME/day: 15 mg/day.   Monitoring: [place] PMP: PDMP reviewed during this encounter.       Pharmacotherapy: No side-effects or adverse reactions reported. Compliance: No problems identified. Effectiveness: Clinically acceptable.  No notes on file  No results found for: "CBDTHCR" No results found for: "D8THCCBX" No results found for: "D9THCCBX"  UDS:  Summary  Date Value Ref Range Status  04/10/2022 Note  Final    Comment:    ==================================================================== [employer] Select 13 (MW) ==================================================================== Specimen Alert Note: Urinary creatinine is low; ability to detect some drugs may be compromised. Interpret results with caution. (Creatinine) ==================================================================== Test                             Result       Flag       Units  Drug Present and Declared for Prescription Verification   7-aminoclonazepam              310          EXPECTED   ng/mg creat    7-aminoclonazepam is an expected metabolite of clonazepam. Source of    clonazepam is a scheduled prescription medication.    Tramadol                       >50000       EXPECTED   ng/mg creat   O-Desmethyltramadol            30510        EXPECTED   ng/mg creat   N-Desmethyltramadol            7260         EXPECTED   ng/mg creat    Source of tramadol is a prescription medication. O-desmethyltramadol    and N-desmethyltramadol are expected metabolites of tramadol.  ==================================================================== Test                      Result    Flag   Units      Ref Range   Creatinine              10        LL     mg/dL       >=43 ==================================================================== Declared Medications:  The flagging and interpretation on this report are based on the  following declared medications.  Unexpected results may arise from  inaccuracies in the declared medications.   **Note: The testing scope of this panel includes these medications:   Clonazepam (Klonopin)  Tramadol (Ultram)   **Note: The testing scope of this panel does not include the  following reported medications:   Acetaminophen (Tylenol)  Amlodipine (Norvasc)  Amoxicillin (Amoxil)  Brimonidine (Alphagan)  Cranberry  Eye Drop  Furosemide (Lasix)  Levothyroxine (Synthroid)  Lisinopril (Zestril)  Melatonin  Methocarbamol (Robaxin)  Metoprolol (Toprol)  Omeprazole (Prilosec)  Sulfamethoxazole (  Bactrim)  Tamsulosin (Flomax)  Trazodone (Desyrel)  Trimethoprim (Bactrim)  Vitamin D3 ==================================================================== For clinical consultation, please call [phone]. ====================================================================       ROS  Constitutional: Denies any fever or chills Gastrointestinal: No reported hemesis, hematochezia, vomiting, or acute GI distress Musculoskeletal: Denies any acute onset joint swelling, redness, loss of ROM, or weakness Neurological: No reported episodes of acute onset apraxia, aphasia, dysarthria, agnosia, amnesia, paralysis, loss of coordination, or loss of consciousness  Medication Review  Cranberry, Melatonin, Vitamin D3, acetaminophen, amLODipine, brimonidine, clonazePAM, furosemide, latanoprost, levothyroxine, lisinopril, methocarbamol, metoprolol succinate, omeprazole, tamsulosin, traMADol, and traZODone  History Review  Allergy: Brittney Simpson has No Known Allergies. Drug: Brittney Simpson  reports no history of drug use. Alcohol:  reports no history of alcohol use. Tobacco:  reports that she has never smoked. She has never  used smokeless tobacco. Social: Brittney Simpson  reports that she has never smoked. She has never used smokeless tobacco. She reports that she does not drink alcohol and does not use drugs. Medical:  has a past medical history of Anxiety, Arthritis, Back ache, Hiatal hernia, Hyperlipidemia, Hypertension, Hypothyroid, Osteopenia, Thyroid disease, and UTI (urinary tract infection). Surgical: Brittney Simpson  has a past surgical history that includes Hernia repair; Hernia repair; Hemorrhoid surgery; Kyphoplasty (N/A, 01/23/2017); Kyphoplasty (N/A, 01/25/2020); and Cholecystectomy. Family: family history includes Cancer in her father; Stroke in her mother.  Laboratory Chemistry Profile   Renal Lab Results  Component Value Date   BUN 13 06/28/2022   CREATININE 1.01 (H) 06/28/2022   BCR 13 06/28/2022   GFRAA 76 05/16/2020   GFRNONAA 66 05/16/2020    Hepatic Lab Results  Component Value Date   AST 9 (L) 06/28/2022   ALT 7 06/28/2022   ALBUMIN 3.6 11/22/2016   ALKPHOS 59 11/22/2016   LIPASE 21 06/14/2016    Electrolytes Lab Results  Component Value Date   NA 137 06/28/2022   K 4.2 06/28/2022   CL 100 06/28/2022   CALCIUM 9.3 06/28/2022   MG 2.1 01/14/2014    Bone No results found for: "VD25OH", "VD125OH2TOT", "IH4742VZ5", "GL8756EP3", "25OHVITD1", "25OHVITD2", "25OHVITD3", "TESTOFREE", "TESTOSTERONE"  Inflammation (CRP: Acute Phase) (ESR: Chronic Phase) No results found for: "CRP", "ESRSEDRATE", "LATICACIDVEN"       Note: Above Lab results reviewed.  Recent Imaging Review  DG PAIN CLINIC C-ARM 1-60 MIN NO REPORT Fluoro was used, but no Radiologist interpretation will be provided.  Please refer to "NOTES" tab for provider progress note. Note: Reviewed        Physical Exam  General appearance: Well nourished, well developed, and well hydrated. In no apparent acute distress Mental status: Alert, oriented x 3 (person, place, & time)       Respiratory: No evidence of acute respiratory  distress Eyes: PERLA Vitals: There were no vitals taken for this visit. BMI: Estimated body mass index is 20.6 kg/m as calculated from the following:   Height as of 01/21/23: 5\' 4"  (1.626 m).   Weight as of 01/21/23: 120 lb (54.4 kg). Ideal: Patient weight not recorded  Assessment   Diagnosis Status  1. Chronic low back pain (Right) w/ radicular pain (Right)   2. Chronic lower extremity pain (3ry area of Pain) (Right)   3. Chronic lumbar radicular pain (Right)   4. Postop check    Controlled Controlled Controlled   Updated Problems: No problems updated.  Plan of Care  Problem-specific:  No problem-specific Assessment & Plan notes found for this encounter.  Brittney Simpson  has a current medication list which includes the following long-term medication(s): amlodipine, clonazepam, furosemide, levothyroxine, lisinopril, methocarbamol, metoprolol succinate, omeprazole, tramadol, and trazodone.  Pharmacotherapy (Medications Ordered): No orders of the defined types were placed in this encounter.  Orders:  No orders of the defined types were placed in this encounter.  Follow-up plan:   No follow-ups on file.      Interventional Therapies  Risk  Complexity Considerations:   Advanced age  HOH  AAA  HTN  Stage3 CKD  GERD   Note: Hard of hearing   Planned  Pending:      Under consideration:   Palliative right T12-L1 LESI #10    Completed:   Palliative right T12-L1 LESI x9 (01/21/2023) (100/100/80/80)  Therapeutic right L2-3 LESI x1 (03/07/2020) (100/100/25/<50)  Diagnostic/therapeutic bilateral SI joint injection x1 (02/10/2020) (100/100/100 x2 days/0)  Palliative right lumbar facet MBB x13 (01/04/2020) (8 to 0) (100/100/100)  Palliative left lumbar facet MBB x11 (01/04/2020) (8 to 0) (100/100/100)    Therapeutic  Palliative (PRN) options:   Palliative right T12-L1 LESI (PRN)   Pharmacotherapy  Nonopioids transferred 04/11/2020: Robaxin        Recent  Visits Date Type Provider Dept  01/21/23 Procedure visit Delano Metz, MD [facility]  Showing recent visits within past 90 days and meeting all other requirements Future Appointments Date Type Provider Dept  03/04/23 Appointment Delano Metz, MD [facility]  04/07/23 Appointment Delano Metz, MD [facility]  Showing future appointments within next 90 days and meeting all other requirements  I discussed the assessment and treatment plan with the patient. The patient was provided an opportunity to ask questions and all were answered. The patient agreed with the plan and demonstrated an understanding of the instructions.  Patient advised to call back or seek an in-person evaluation if the symptoms or condition worsens.  Duration of encounter: *** minutes.  Total time on encounter, as per AMA guidelines included both the face-to-face and non-face-to-face time personally spent by the physician and/or other qualified health care professional(s) on the day of the encounter (includes time in activities that require the physician or other qualified health care professional and does not include time in activities normally performed by clinical staff). Physician's time may include the following activities when performed: Preparing to see the patient (e.g., pre-charting review of records, searching for previously ordered imaging, lab work, and nerve conduction tests) Review of prior analgesic pharmacotherapies. Reviewing PMP Interpreting ordered tests (e.g., lab work, imaging, nerve conduction tests) Performing post-procedure evaluations, including interpretation of diagnostic procedures Obtaining and/or reviewing separately obtained history Performing a medically appropriate examination and/or evaluation Counseling and educating the patient/family/caregiver Ordering medications, tests, or procedures Referring and communicating with other health care  professionals (when not separately reported) Documenting clinical information in the electronic or other health record Independently interpreting results (not separately reported) and communicating results to the patient/ family/caregiver Care coordination (not separately reported)  Note by: Oswaldo Done, MD Date: 03/04/2023; Time: 8:17 PM

## 2023-03-04 ENCOUNTER — Ambulatory Visit: Payer: Medicare Other | Attending: Pain Medicine | Admitting: Pain Medicine

## 2023-03-04 ENCOUNTER — Encounter: Payer: Self-pay | Admitting: Pain Medicine

## 2023-03-04 VITALS — BP 106/60 | HR 59 | Temp 94.3°F | Ht 63.0 in | Wt 120.0 lb

## 2023-03-04 DIAGNOSIS — M79604 Pain in right leg: Secondary | ICD-10-CM | POA: Diagnosis not present

## 2023-03-04 DIAGNOSIS — Z09 Encounter for follow-up examination after completed treatment for conditions other than malignant neoplasm: Secondary | ICD-10-CM | POA: Diagnosis not present

## 2023-03-04 DIAGNOSIS — M5441 Lumbago with sciatica, right side: Secondary | ICD-10-CM

## 2023-03-04 DIAGNOSIS — G8929 Other chronic pain: Secondary | ICD-10-CM

## 2023-03-04 DIAGNOSIS — M5416 Radiculopathy, lumbar region: Secondary | ICD-10-CM | POA: Diagnosis not present

## 2023-03-04 NOTE — Progress Notes (Unsigned)
Safety precautions to be maintained throughout the outpatient stay will include: orient to surroundings, keep bed in low position, maintain call bell within reach at all times, provide assistance with transfer out of bed and ambulation.  

## 2023-03-04 NOTE — Patient Instructions (Signed)
______________________________________________________________________  Procedure instructions  Do not eat or drink fluids (other than water) for 6 hours before your procedure  No water for 2 hours before your procedure  Take your blood pressure medicine with a sip of water  Arrive 30 minutes before your appointment  Carefully read the "Preparing for your procedure" detailed instructions  If you have questions call us at [phone]  _____________________________________________________________________    ______________________________________________________________________  Preparing for your procedure  Appointments: If you think you may not be able to keep your appointment, call 24-48 hours in advance to cancel. We need time to make it available to others.  During your procedure appointment there will be: No Prescription Refills. No disability issues to discussed. No medication changes or discussions.  Instructions: Food intake: Avoid eating anything solid for at least 8 hours prior to your procedure. Clear liquid intake: You may take clear liquids such as water up to 2 hours prior to your procedure. (No carbonated drinks. No soda.) Transportation: Unless otherwise stated by your physician, bring a driver. Morning Medicines: Except for blood thinners, take all of your other morning medications with a sip of water. Make sure to take your heart and blood pressure medicines. If your blood pressure's lower number is above 100, the case will be rescheduled. Blood thinners: Make sure to stop your blood thinners as instructed.  If you take a blood thinner, but were not instructed to stop it, call our office [phone] and ask to talk to a nurse. Not stopping a blood thinner prior to certain procedures could lead to serious complications. Diabetics on insulin: Notify the staff so that you can be scheduled 1st case in the morning. If your diabetes requires high dose insulin,  take only  of your normal insulin dose the morning of the procedure and notify the staff that you have done so. Preventing infections: Shower with an antibacterial soap the morning of your procedure.  Build-up your immune system: Take 1000 mg of Vitamin C with every meal (3 times a day) the day prior to your procedure. Antibiotics: Inform the nursing staff if you are taking any antibiotics or if you have any conditions that may require antibiotics prior to procedures. (Example: recent joint implants)   Pregnancy: If you are pregnant make sure to notify the nursing staff. Not doing so may result in injury to the fetus, including death.  Sickness: If you have a cold, fever, or any active infections, call and cancel or reschedule your procedure. Receiving steroids while having an infection may result in complications. Arrival: You must be in the facility at least 30 minutes prior to your scheduled procedure. Tardiness: Your scheduled time is also the cutoff time. If you do not arrive at least 15 minutes prior to your procedure, you will be rescheduled.  Children: Do not bring any children with you. Make arrangements to keep them home. Dress appropriately: There is always a possibility that your clothing may get soiled. Avoid long dresses. Valuables: Do not bring any jewelry or valuables.  Reasons to call and reschedule or cancel your procedure: (Following these recommendations will minimize the risk of a serious complication.) Surgeries: Avoid having procedures within 2 weeks of any surgery. (Avoid for 2 weeks before or after any surgery). Flu Shots: Avoid having procedures within 2 weeks of a flu shots or . (Avoid for 2 weeks before or after immunizations). Barium: Avoid having a procedure within 7-10 days after having had a radiological study involving the use of   radiological contrast. (Myelograms, Barium swallow or enema study). Heart attacks: Avoid any elective procedures or surgeries for the  initial 6 months after a "Myocardial Infarction" (Heart Attack). Blood thinners: It is imperative that you stop these medications before procedures. Let us know if you if you take any blood thinner.  Infection: Avoid procedures during or within two weeks of an infection (including chest colds or gastrointestinal problems). Symptoms associated with infections include: Localized redness, fever, chills, night sweats or profuse sweating, burning sensation when voiding, cough, congestion, stuffiness, runny nose, sore throat, diarrhea, nausea, vomiting, cold or Flu symptoms, recent or current infections. It is specially important if the infection is over the area that we intend to treat. Heart and lung problems: Symptoms that may suggest an active cardiopulmonary problem include: cough, chest pain, breathing difficulties or shortness of breath, dizziness, ankle swelling, uncontrolled high or unusually low blood pressure, and/or palpitations. If you are experiencing any of these symptoms, cancel your procedure and contact your primary care physician for an evaluation.  Remember:  Regular Business hours are:  Monday to Thursday 8:00 AM to 4:00 PM  Provider's Schedule:  , MD:  Procedure days: Tuesday and Thursday 7:30 AM to 4:00 PM  Bilal Lateef, MD:  Procedure days: Monday and Wednesday 7:30 AM to 4:00 PM  ______________________________________________________________________    ____________________________________________________________________________________________  General Risks and Possible Complications  Patient Responsibilities: It is important that you read this as it is part of your informed consent. It is our duty to inform you of the risks and possible complications associated with treatments offered to you. It is your responsibility as a patient to read this and to ask questions about anything that is not clear or that you believe was not covered in this  document.  Patient's Rights: You have the right to refuse treatment. You also have the right to change your mind, even after initially having agreed to have the treatment done. However, under this last option, if you wait until the last second to change your mind, you may be charged for the materials used up to that point.  Introduction: Medicine is not an exact science. Everything in Medicine, including the lack of treatment(s), carries the potential for danger, harm, or loss (which is by definition: Risk). In Medicine, a complication is a secondary problem, condition, or disease that can aggravate an already existing one. All treatments carry the risk of possible complications. The fact that a side effects or complications occurs, does not imply that the treatment was conducted incorrectly. It must be clearly understood that these can happen even when everything is done following the highest safety standards.  No treatment: You can choose not to proceed with the proposed treatment alternative. The "PRO(s)" would include: avoiding the risk of complications associated with the therapy. The "CON(s)" would include: not getting any of the treatment benefits. These benefits fall under one of three categories: diagnostic; therapeutic; and/or palliative. Diagnostic benefits include: getting information which can ultimately lead to improvement of the disease or symptom(s). Therapeutic benefits are those associated with the successful treatment of the disease. Finally, palliative benefits are those related to the decrease of the primary symptoms, without necessarily curing the condition (example: decreasing the pain from a flare-up of a chronic condition, such as incurable terminal cancer).  General Risks and Complications: These are associated to most interventional treatments. They can occur alone, or in combination. They fall under one of the following six (6) categories: no benefit or worsening of symptoms;    bleeding; infection; nerve damage; allergic reactions; and/or death. No benefits or worsening of symptoms: In Medicine there are no guarantees, only probabilities. No healthcare provider can ever guarantee that a medical treatment will work, they can only state the probability that it may. Furthermore, there is always the possibility that the condition may worsen, either directly, or indirectly, as a consequence of the treatment. Bleeding: This is more common if the patient is taking a blood thinner, either prescription or over the counter (example: Goody Powders, Fish oil, Aspirin, Garlic, etc.), or if suffering a condition associated with impaired coagulation (example: Hemophilia, cirrhosis of the liver, low platelet counts, etc.). However, even if you do not have one on these, it can still happen. If you have any of these conditions, or take one of these drugs, make sure to notify your treating physician. Infection: This is more common in patients with a compromised immune system, either due to disease (example: diabetes, cancer, human immunodeficiency virus [HIV], etc.), or due to medications or treatments (example: therapies used to treat cancer and rheumatological diseases). However, even if you do not have one on these, it can still happen. If you have any of these conditions, or take one of these drugs, make sure to notify your treating physician. Nerve Damage: This is more common when the treatment is an invasive one, but it can also happen with the use of medications, such as those used in the treatment of cancer. The damage can occur to small secondary nerves, or to large primary ones, such as those in the spinal cord and brain. This damage may be temporary or permanent and it may lead to impairments that can range from temporary numbness to permanent paralysis and/or brain death. Allergic Reactions: Any time a substance or material comes in contact with our body, there is the possibility of an  allergic reaction. These can range from a mild skin rash (contact dermatitis) to a severe systemic reaction (anaphylactic reaction), which can result in death. Death: In general, any medical intervention can result in death, most of the time due to an unforeseen complication. ____________________________________________________________________________________________    ____________________________________________________________________________________________  Blood Thinners  IMPORTANT NOTICE:  If you take any of these, make sure to notify the nursing staff.  Failure to do so may result in injury.  Recommended time intervals to stop and restart blood-thinners, before & after invasive procedures  Generic Name Brand Name Pre-procedure. Stop this long before procedure. Post-procedure. Minimum waiting period before restarting.  Abciximab Reopro 15 days 2 hrs  Alteplase Activase 10 days 10 days  Anagrelide Agrylin    Apixaban Eliquis 3 days 6 hrs  Cilostazol Pletal 3 days 5 hrs  Clopidogrel Plavix 7-10 days 2 hrs  Dabigatran Pradaxa 5 days 6 hrs  Dalteparin Fragmin 24 hours 4 hrs  Dipyridamole Aggrenox 11days 2 hrs  Edoxaban Lixiana; Savaysa 3 days 2 hrs  Enoxaparin  Lovenox 24 hours 4 hrs  Eptifibatide Integrillin 8 hours 2 hrs  Fondaparinux  Arixtra 72 hours 12 hrs  Hydroxychloroquine Plaquenil 11 days   Prasugrel Effient 7-10 days 6 hrs  Reteplase Retavase 10 days 10 days  Rivaroxaban Xarelto 3 days 6 hrs  Ticagrelor Brilinta 5-7 days 6 hrs  Ticlopidine Ticlid 10-14 days 2 hrs  Tinzaparin Innohep 24 hours 4 hrs  Tirofiban Aggrastat 8 hours 2 hrs  Warfarin Coumadin 5 days 2 hrs   Other medications with blood-thinning effects  Product indications Generic (Brand) names Note  Cholesterol Lipitor Stop 4 days before procedure    Blood thinner (injectable) Heparin (LMW or LMWH Heparin) Stop 24 hours before procedure  Cancer Ibrutinib (Imbruvica) Stop 7 days before procedure   Malaria/Rheumatoid Hydroxychloroquine (Plaquenil) Stop 11 days before procedure  Thrombolytics  10 days before or after procedures   Over-the-counter (OTC) Products with blood-thinning effects  Product Common names Stop Time  Aspirin > 325 mg Goody Powders, Excedrin, etc. 11 days  Aspirin ? 81 mg  7 days  Fish oil  4 days  Garlic supplements  7 days  Ginkgo biloba  36 hours  Ginseng  24 hours  NSAIDs Ibuprofen, Naprosyn, etc. 3 days  Vitamin E  4 days   ____________________________________________________________________________________________    

## 2023-03-07 DIAGNOSIS — H401133 Primary open-angle glaucoma, bilateral, severe stage: Secondary | ICD-10-CM | POA: Diagnosis not present

## 2023-03-07 DIAGNOSIS — H18513 Endothelial corneal dystrophy, bilateral: Secondary | ICD-10-CM | POA: Diagnosis not present

## 2023-03-07 DIAGNOSIS — H35372 Puckering of macula, left eye: Secondary | ICD-10-CM | POA: Diagnosis not present

## 2023-03-07 DIAGNOSIS — Z961 Presence of intraocular lens: Secondary | ICD-10-CM | POA: Diagnosis not present

## 2023-04-02 ENCOUNTER — Other Ambulatory Visit: Payer: Self-pay | Admitting: Pain Medicine

## 2023-04-02 ENCOUNTER — Other Ambulatory Visit: Payer: Self-pay | Admitting: Family Medicine

## 2023-04-02 DIAGNOSIS — S22080S Wedge compression fracture of T11-T12 vertebra, sequela: Secondary | ICD-10-CM

## 2023-04-02 DIAGNOSIS — M47816 Spondylosis without myelopathy or radiculopathy, lumbar region: Secondary | ICD-10-CM

## 2023-04-02 DIAGNOSIS — Z79891 Long term (current) use of opiate analgesic: Secondary | ICD-10-CM

## 2023-04-02 DIAGNOSIS — G8929 Other chronic pain: Secondary | ICD-10-CM

## 2023-04-02 DIAGNOSIS — G894 Chronic pain syndrome: Secondary | ICD-10-CM

## 2023-04-02 DIAGNOSIS — M545 Low back pain, unspecified: Secondary | ICD-10-CM

## 2023-04-02 DIAGNOSIS — N183 Chronic kidney disease, stage 3 unspecified: Secondary | ICD-10-CM

## 2023-04-02 DIAGNOSIS — M5135 Other intervertebral disc degeneration, thoracolumbar region: Secondary | ICD-10-CM

## 2023-04-02 DIAGNOSIS — M4854XS Collapsed vertebra, not elsewhere classified, thoracic region, sequela of fracture: Secondary | ICD-10-CM

## 2023-04-02 DIAGNOSIS — Z79899 Other long term (current) drug therapy: Secondary | ICD-10-CM

## 2023-04-02 DIAGNOSIS — M5137 Other intervertebral disc degeneration, lumbosacral region: Secondary | ICD-10-CM

## 2023-04-03 NOTE — Progress Notes (Unsigned)
PROVIDER NOTE: Information contained herein reflects review and annotations entered in association with encounter. Interpretation of such information and data should be left to medically-trained personnel. Information provided to patient can be located elsewhere in the medical record under "Patient Instructions". Document created using STT-dictation technology, any transcriptional errors that may result from process are unintentional.    Patient: Brittney Simpson  Service Category: E/M  Provider: Oswaldo Done, MD  DOB: [birth year]  DOS: 04/07/2023  Referring Provider: Saralyn Pilar *  MRN: [id number]  Specialty: Interventional Pain Management  PCP: Smitty Cords, DO  Type: Established Patient  Setting: Ambulatory outpatient    Location: Office  Delivery: Face-to-face     HPI  [name], a 89 y.o. year old female, is here today because of her Chronic pain syndrome [G89.4]. Ms. Buckson primary complain today is No chief complaint on file.  Pertinent problems: Ms. Mooneyhan has Lumbar facet syndrome (Bilateral) (R>L); Chronic low back pain (1ry area of Pain) (Bilateral) (R>L) w/o sciatica; Spondylolisthesis of lumbosacral region (L2-3 and L5-S1); Lumbar spinal stenosis (9 mm at L3-4); Trochanteric bursitis (Right); Chronic hip pain (2ry area of Pain) (Right); Osteoarthritis of hip (Right); Chronic sacroiliac joint pain (Bilateral) (R>L); Chronic lumbar radicular pain (Right); Chronic lower extremity pain (3ry area of Pain) (Right); Myofascial pain; Spondylosis without myelopathy or radiculopathy, lumbosacral region; Ankle edema, bilateral; Abnormal MRI, lumbar spine (01/10/2017); T12 compression fracture, sequela; Lumbar facet hypertrophy; Lumbar lateral recess stenosis; Lumbar foraminal stenosis; Non-traumatic compression fracture of T7 thoracic vertebra, sequela; Chronic pain syndrome; DDD (degenerative disc disease), lumbosacral; Edema of lower extremity; Somatic  dysfunction of sacroiliac joints (Bilateral); Other spondylosis, sacral and sacrococcygeal region; Spasm of back muscles; DDD (degenerative disc disease), thoracolumbar; and Chronic low back pain (Right) w/ radicular pain (Right) on their pertinent problem list. Pain Assessment: Severity of   is reported as a  /10. Location:    / . Onset:  . Quality:  . Timing:  . Modifying factor(s):  Marland Kitchen Vitals:  vitals were not taken for this visit.  BMI: Estimated body mass index is 21.26 kg/m as calculated from the following:   Height as of 03/04/23: 5\' 3"  (1.6 m).   Weight as of 03/04/23: 120 lb (54.4 kg). Last encounter: 03/04/2023. Last procedure: 01/21/2023.  Reason for encounter: medication management. ***  Routine UDS ordered today.   RTCB: 10/09/2023   Pharmacotherapy Assessment  Analgesic: Tramadol 50 mg, 1 tab PO q 8 hrs (150 mg/day of tramadol) MME/day: 15 mg/day.   Monitoring: [place] PMP: PDMP reviewed during this encounter.       Pharmacotherapy: No side-effects or adverse reactions reported. Compliance: No problems identified. Effectiveness: Clinically acceptable.  No notes on file  No results found for: "CBDTHCR" No results found for: "D8THCCBX" No results found for: "D9THCCBX"  UDS:  Summary  Date Value Ref Range Status  04/10/2022 Note  Final    Comment:    ==================================================================== [employer] Select 13 (MW) ==================================================================== Specimen Alert Note: Urinary creatinine is low; ability to detect some drugs may be compromised. Interpret results with caution. (Creatinine) ==================================================================== Test                             Result       Flag       Units  Drug Present and Declared for Prescription Verification   7-aminoclonazepam              310  EXPECTED   ng/mg creat    7-aminoclonazepam is an expected metabolite of clonazepam. Source  of    clonazepam is a scheduled prescription medication.    Tramadol                       >50000       EXPECTED   ng/mg creat   O-Desmethyltramadol            30510        EXPECTED   ng/mg creat   N-Desmethyltramadol            7260         EXPECTED   ng/mg creat    Source of tramadol is a prescription medication. O-desmethyltramadol    and N-desmethyltramadol are expected metabolites of tramadol.  ==================================================================== Test                      Result    Flag   Units      Ref Range   Creatinine              10        LL     mg/dL      >=82 ==================================================================== Declared Medications:  The flagging and interpretation on this report are based on the  following declared medications.  Unexpected results may arise from  inaccuracies in the declared medications.   **Note: The testing scope of this panel includes these medications:   Clonazepam (Klonopin)  Tramadol (Ultram)   **Note: The testing scope of this panel does not include the  following reported medications:   Acetaminophen (Tylenol)  Amlodipine (Norvasc)  Amoxicillin (Amoxil)  Brimonidine (Alphagan)  Cranberry  Eye Drop  Furosemide (Lasix)  Levothyroxine (Synthroid)  Lisinopril (Zestril)  Melatonin  Methocarbamol (Robaxin)  Metoprolol (Toprol)  Omeprazole (Prilosec)  Sulfamethoxazole (Bactrim)  Tamsulosin (Flomax)  Trazodone (Desyrel)  Trimethoprim (Bactrim)  Vitamin D3 ==================================================================== For clinical consultation, please call [phone]. ====================================================================       ROS  Constitutional: Denies any fever or chills Gastrointestinal: No reported hemesis, hematochezia, vomiting, or acute GI distress Musculoskeletal: Denies any acute onset joint swelling, redness, loss of ROM, or weakness Neurological: No reported  episodes of acute onset apraxia, aphasia, dysarthria, agnosia, amnesia, paralysis, loss of coordination, or loss of consciousness  Medication Review  Cranberry, Melatonin, Vitamin D3, acetaminophen, amLODipine, brimonidine, clonazePAM, furosemide, latanoprost, levothyroxine, lisinopril, methocarbamol, metoprolol succinate, omeprazole, tamsulosin, traMADol, and traZODone  History Review  Allergy: Ms. Sobelman has No Known Allergies. Drug: Ms. Troxler  reports no history of drug use. Alcohol:  reports no history of alcohol use. Tobacco:  reports that she has never smoked. She has never used smokeless tobacco. Social: Ms. Kniceley  reports that she has never smoked. She has never used smokeless tobacco. She reports that she does not drink alcohol and does not use drugs. Medical:  has a past medical history of Anxiety, Arthritis, Back ache, Hiatal hernia, Hyperlipidemia, Hypertension, Hypothyroid, Osteopenia, Thyroid disease, and UTI (urinary tract infection). Surgical: Ms. Milnor  has a past surgical history that includes Hernia repair; Hernia repair; Hemorrhoid surgery; Kyphoplasty (N/A, 01/23/2017); Kyphoplasty (N/A, 01/25/2020); and Cholecystectomy. Family: family history includes Cancer in her father; Stroke in her mother.  Laboratory Chemistry Profile   Renal Lab Results  Component Value Date   BUN 13 06/28/2022   CREATININE 1.01 (H) 06/28/2022   BCR 13 06/28/2022   GFRAA 76 05/16/2020   GFRNONAA 66 05/16/2020  Hepatic Lab Results  Component Value Date   AST 9 (L) 06/28/2022   ALT 7 06/28/2022   ALBUMIN 3.6 11/22/2016   ALKPHOS 59 11/22/2016   LIPASE 21 06/14/2016    Electrolytes Lab Results  Component Value Date   NA 137 06/28/2022   K 4.2 06/28/2022   CL 100 06/28/2022   CALCIUM 9.3 06/28/2022   MG 2.1 01/14/2014    Bone No results found for: "VD25OH", "VD125OH2TOT", "UJ8119JY7", "[id number]", "25OHVITD1", "25OHVITD2", "25OHVITD3", "TESTOFREE", "TESTOSTERONE"   Inflammation (CRP: Acute Phase) (ESR: Chronic Phase) No results found for: "CRP", "ESRSEDRATE", "LATICACIDVEN"       Note: Above Lab results reviewed.  Recent Imaging Review  DG PAIN CLINIC C-ARM 1-60 MIN NO REPORT Fluoro was used, but no Radiologist interpretation will be provided.  Please refer to "NOTES" tab for provider progress note. Note: Reviewed        Physical Exam  General appearance: Well nourished, well developed, and well hydrated. In no apparent acute distress Mental status: Alert, oriented x 3 (person, place, & time)       Respiratory: No evidence of acute respiratory distress Eyes: PERLA Vitals: There were no vitals taken for this visit. BMI: Estimated body mass index is 21.26 kg/m as calculated from the following:   Height as of 03/04/23: 5\' 3"  (1.6 m).   Weight as of 03/04/23: 120 lb (54.4 kg). Ideal: Patient weight not recorded  Assessment   Diagnosis Status  1. Chronic pain syndrome   2. Chronic low back pain (1ry area of Pain) (Bilateral) (R>L) w/o sciatica   3. Chronic hip pain (2ry area of Pain) (Right)   4. Chronic lower extremity pain (3ry area of Pain) (Right)   5. Non-traumatic compression fracture of T7 thoracic vertebra, sequela   6. T12 compression fracture, sequela   7. DDD (degenerative disc disease), thoracolumbar   8. DDD (degenerative disc disease), lumbosacral   9. Chronic sacroiliac joint pain (Bilateral) (R>L)   10. Lumbar facet syndrome (Bilateral) (R>L)   11. Pharmacologic therapy   12. Chronic use of opiate for therapeutic purpose   13. Encounter for medication management   14. Encounter for chronic pain management    Controlled Controlled Controlled   Updated Problems: No problems updated.  Plan of Care  Problem-specific:  No problem-specific Assessment & Plan notes found for this encounter.  Ms. MYLENA BLECK has a current medication list which includes the following long-term medication(s): amlodipine, clonazepam,  furosemide, levothyroxine, lisinopril, methocarbamol, metoprolol succinate, omeprazole, tramadol, and trazodone.  Pharmacotherapy (Medications Ordered): No orders of the defined types were placed in this encounter.  Orders:  No orders of the defined types were placed in this encounter.  Follow-up plan:   No follow-ups on file.      Interventional Therapies  Risk  Complexity Considerations:   Advanced age  HOH  AAA  HTN  Stage3 CKD  GERD   Note: Hard of hearing   Planned  Pending:      Under consideration:   Palliative right T12-L1 LESI #10    Completed:   Palliative right T12-L1 LESI x9 (01/21/2023) (100/100/80/80)  Therapeutic right L2-3 LESI x1 (03/07/2020) (100/100/25/<50)  Diagnostic/therapeutic bilateral SI joint injection x1 (02/10/2020) (100/100/100 x2 days/0)  Palliative right lumbar facet MBB x13 (01/04/2020) (8 to 0) (100/100/100)  Palliative left lumbar facet MBB x11 (01/04/2020) (8 to 0) (100/100/100)    Therapeutic  Palliative (PRN) options:   Palliative right T12-L1 LESI (PRN)   Pharmacotherapy  Nonopioids transferred 04/11/2020:  Robaxin         Recent Visits Date Type Provider Dept  03/04/23 Office Visit Delano Metz, MD [facility]  01/21/23 Procedure visit Delano Metz, MD [facility]  Showing recent visits within past 90 days and meeting all other requirements Future Appointments Date Type Provider Dept  04/07/23 Appointment Delano Metz, MD [facility]  Showing future appointments within next 90 days and meeting all other requirements  I discussed the assessment and treatment plan with the patient. The patient was provided an opportunity to ask questions and all were answered. The patient agreed with the plan and demonstrated an understanding of the instructions.  Patient advised to call back or seek an in-person evaluation if the symptoms or condition worsens.  Duration of encounter: ***  minutes.  Total time on encounter, as per AMA guidelines included both the face-to-face and non-face-to-face time personally spent by the physician and/or other qualified health care professional(s) on the day of the encounter (includes time in activities that require the physician or other qualified health care professional and does not include time in activities normally performed by clinical staff). Physician's time may include the following activities when performed: Preparing to see the patient (e.g., pre-charting review of records, searching for previously ordered imaging, lab work, and nerve conduction tests) Review of prior analgesic pharmacotherapies. Reviewing PMP Interpreting ordered tests (e.g., lab work, imaging, nerve conduction tests) Performing post-procedure evaluations, including interpretation of diagnostic procedures Obtaining and/or reviewing separately obtained history Performing a medically appropriate examination and/or evaluation Counseling and educating the patient/family/caregiver Ordering medications, tests, or procedures Referring and communicating with other health care professionals (when not separately reported) Documenting clinical information in the electronic or other health record Independently interpreting results (not separately reported) and communicating results to the patient/ family/caregiver Care coordination (not separately reported)  Note by: Oswaldo Done, MD Date: 04/07/2023; Time: 4:58 PM

## 2023-04-07 ENCOUNTER — Ambulatory Visit: Payer: Medicare Other | Attending: Pain Medicine | Admitting: Pain Medicine

## 2023-04-07 ENCOUNTER — Encounter: Payer: Self-pay | Admitting: Pain Medicine

## 2023-04-07 VITALS — BP 146/70 | HR 59 | Temp 97.4°F | Ht 63.0 in | Wt 120.0 lb

## 2023-04-07 DIAGNOSIS — M545 Low back pain, unspecified: Secondary | ICD-10-CM | POA: Diagnosis present

## 2023-04-07 DIAGNOSIS — Z79899 Other long term (current) drug therapy: Secondary | ICD-10-CM | POA: Insufficient documentation

## 2023-04-07 DIAGNOSIS — G8929 Other chronic pain: Secondary | ICD-10-CM | POA: Diagnosis not present

## 2023-04-07 DIAGNOSIS — M533 Sacrococcygeal disorders, not elsewhere classified: Secondary | ICD-10-CM | POA: Insufficient documentation

## 2023-04-07 DIAGNOSIS — S22080S Wedge compression fracture of T11-T12 vertebra, sequela: Secondary | ICD-10-CM | POA: Insufficient documentation

## 2023-04-07 DIAGNOSIS — M4854XS Collapsed vertebra, not elsewhere classified, thoracic region, sequela of fracture: Secondary | ICD-10-CM | POA: Diagnosis not present

## 2023-04-07 DIAGNOSIS — G894 Chronic pain syndrome: Secondary | ICD-10-CM | POA: Insufficient documentation

## 2023-04-07 DIAGNOSIS — M5137 Other intervertebral disc degeneration, lumbosacral region: Secondary | ICD-10-CM | POA: Insufficient documentation

## 2023-04-07 DIAGNOSIS — Z79891 Long term (current) use of opiate analgesic: Secondary | ICD-10-CM | POA: Insufficient documentation

## 2023-04-07 DIAGNOSIS — M79604 Pain in right leg: Secondary | ICD-10-CM | POA: Diagnosis present

## 2023-04-07 DIAGNOSIS — M5135 Other intervertebral disc degeneration, thoracolumbar region: Secondary | ICD-10-CM | POA: Insufficient documentation

## 2023-04-07 DIAGNOSIS — M47816 Spondylosis without myelopathy or radiculopathy, lumbar region: Secondary | ICD-10-CM | POA: Insufficient documentation

## 2023-04-07 DIAGNOSIS — M25551 Pain in right hip: Secondary | ICD-10-CM | POA: Diagnosis present

## 2023-04-07 MED ORDER — NALOXONE HCL 4 MG/0.1ML NA LIQD
1.0000 | NASAL | 0 refills | Status: AC | PRN
Start: 2023-04-07 — End: 2024-04-06

## 2023-04-07 MED ORDER — TRAMADOL HCL 50 MG PO TABS: 50.0000 mg | ORAL_TABLET | Freq: Three times a day (TID) | ORAL | 5 refills | Status: DC

## 2023-04-07 NOTE — Patient Instructions (Addendum)
Preparing for your procedure (without sedation) Instructions: Oral Intake: Do not eat or drink anything for at least 3 hours prior to your procedure. Transportation: Unless otherwise stated by your physician, you may drive yourself after the procedure. Blood Pressure Medicine: Take your blood pressure medicine with a sip of water the morning of the procedure. Insulin: Take only  of your normal insulin dose. Preventing infections: Shower with an antibacterial soap the morning of your procedure. Build-up your immune system: Take 1000 mg of Vitamin C with every meal (3 times a day) the day prior to your procedure. Pregnancy: If you are pregnant, call and cancel the procedure. Sickness: If you have a cold, fever, or any active infections, call and cancel the procedure. Arrival: You must be in the facility at least 30 minutes prior to your scheduled procedure. Children: Do not bring any children with you. Dress appropriately: Bring dark clothing that you would not mind if they get stained. Valuables: Do not bring any jewelry or valuables. Procedure appointments are reserved for interventional treatments only. No Prescription Refills. No medication changes will be discussed during procedure appointments. No disability issues will be discussed.  ______________________________________________________________________    Opioid Pain Medication Update  To: All patients taking opioid pain medications. (I.e.: hydrocodone, hydromorphone, oxycodone, oxymorphone, morphine, codeine, methadone, tapentadol, tramadol, buprenorphine, fentanyl, etc.)  Re: Updated review of side effects and adverse reactions of opioid analgesics, as well as new information about long term effects of this class of medications.  Direct risks of long-term opioid therapy are not limited to opioid addiction and overdose. Potential medical risks include serious fractures, breathing problems during sleep, hyperalgesia,  immunosuppression, chronic constipation, bowel obstruction, myocardial infarction, and tooth decay secondary to xerostomia.  Unpredictable adverse effects that can occur even if you take your medication correctly: Cognitive impairment, respiratory depression, and death. Most people think that if they take their medication "correctly", and "as instructed", that they will be safe. Nothing could be farther from the truth. In reality, a significant amount of recorded deaths associated with the use of opioids has occurred in individuals that had taken the medication for a long time, and were taking their medication correctly. The following are examples of how this can happen: Patient taking his/her medication for a long time, as instructed, without any side effects, is given a certain antibiotic or another unrelated medication, which in turn triggers a "Drug-to-drug interaction" leading to disorientation, cognitive impairment, impaired reflexes, respiratory depression or an untoward event leading to serious bodily harm or injury, including death.  Patient taking his/her medication for a long time, as instructed, without any side effects, develops an acute impairment of liver and/or kidney function. This will lead to a rapid inability of the body to breakdown and eliminate their pain medication, which will result in effects similar to an "overdose", but with the same medicine and dose that they had always taken. This again may lead to disorientation, cognitive impairment, impaired reflexes, respiratory depression or an untoward event leading to serious bodily harm or injury, including death.  A similar problem will occur with patients as they grow older and their liver and kidney function begins to decrease as part of the aging process.  Background information: Historically, the original case for using long-term opioid therapy to treat chronic noncancer pain was based on safety assumptions that subsequent  experience has called into question. In 1996, the American Pain Society and the American Academy of Pain Medicine issued a consensus statement supporting long-term opioid therapy. This  statement acknowledged the dangers of opioid prescribing but concluded that the risk for addiction was low; respiratory depression induced by opioids was short-lived, occurred mainly in opioid-naive patients, and was antagonized by pain; tolerance was not a common problem; and efforts to control diversion should not constrain opioid prescribing. This has now proven to be wrong. Experience regarding the risks for opioid addiction, misuse, and overdose in community practice has failed to support these assumptions.  According to the Centers for Disease Control and Prevention, fatal overdoses involving opioid analgesics have increased sharply over the past decade. Currently, more than 96,700 people die from drug overdoses every year. Opioids are a factor in 7 out of every 10 overdose deaths. Deaths from drug overdose have surpassed motor vehicle accidents as the leading cause of death for individuals between the ages of 13 and 40.  Clinical data suggest that neuroendocrine dysfunction may be very common in both men and women, potentially causing hypogonadism, erectile dysfunction, infertility, decreased libido, osteoporosis, and depression. Recent studies linked higher opioid dose to increased opioid-related mortality. Controlled observational studies reported that long-term opioid therapy may be associated with increased risk for cardiovascular events. Subsequent meta-analysis concluded that the safety of long-term opioid therapy in elderly patients has not been proven.   Side Effects and adverse reactions: Common side effects: Drowsiness (sedation). Dizziness. Nausea and vomiting. Constipation. Physical dependence -- Dependence often manifests with withdrawal symptoms when opioids are discontinued or decreased. Tolerance --  As you take repeated doses of opioids, you require increased medication to experience the same effect of pain relief. Respiratory depression -- This can occur in healthy people, especially with higher doses. However, people with COPD, asthma or other lung conditions may be even more susceptible to fatal respiratory impairment.  Uncommon side effects: An increased sensitivity to feeling pain and extreme response to pain (hyperalgesia). Chronic use of opioids can lead to this. Delayed gastric emptying (the process by which the contents of your stomach are moved into your small intestine). Muscle rigidity. Immune system and hormonal dysfunction. Quick, involuntary muscle jerks (myoclonus). Arrhythmia. Itchy skin (pruritus). Dry mouth (xerostomia).  Long-term side effects: Chronic constipation. Sleep-disordered breathing (SDB). Increased risk of bone fractures. Hypothalamic-pituitary-adrenal dysregulation. Increased risk of overdose.  RISKS: Respiratory depression and death: Opioids increase the risk of respiratory depression and death.  Drug-to-drug interactions: Opioids are relatively contraindicated in combination with benzodiazepines, sleep inducers, and other central nervous system depressants. Other classes of medications (i.e.: certain antibiotics and even over-the-counter medications) may also trigger or induce respiratory depression in some patients.  Medical conditions: Patients with pre-existing respiratory problems are at higher risk of respiratory failure and/or depression when in combination with opioid analgesics. Opioids are relatively contraindicated in some medical conditions such as central sleep apnea.   Fractures and Falls:  Opioids increase the risk and incidence of falls. This is of particular importance in elderly patients.  Endocrine System:  Long-term administration is associated with endocrine abnormalities (endocrinopathies). (Also known as Opioid-induced  Endocrinopathy) Influences on both the hypothalamic-pituitary-adrenal axis?and the hypothalamic-pituitary-gonadal axis have been demonstrated with consequent hypogonadism and adrenal insufficiency in both sexes. Hypogonadism and decreased levels of dehydroepiandrosterone sulfate have been reported in men and women. Endocrine effects include: Amenorrhoea in women (abnormal absence of menstruation) Reduced libido in both sexes Decreased sexual function Erectile dysfunction in men Hypogonadisms (decreased testicular function with shrinkage of testicles) Infertility Depression and fatigue Loss of muscle mass Anxiety Depression Immune suppression Hyperalgesia Weight gain Anemia Osteoporosis Patients (particularly women of childbearing age)  should avoid opioids. There is insufficient evidence to recommend routine monitoring of asymptomatic patients taking opioids in the long-term for hormonal deficiencies.  Immune System: Human studies have demonstrated that opioids have an immunomodulating effect. These effects are mediated via opioid receptors both on immune effector cells and in the central nervous system. Opioids have been demonstrated to have adverse effects on antimicrobial response and anti-tumour surveillance. Buprenorphine has been demonstrated to have no impact on immune function.  Opioid Induced Hyperalgesia: Human studies have demonstrated that prolonged use of opioids can lead to a state of abnormal pain sensitivity, sometimes called opioid induced hyperalgesia (OIH). Opioid induced hyperalgesia is not usually seen in the absence of tolerance to opioid analgesia. Clinically, hyperalgesia may be diagnosed if the patient on long-term opioid therapy presents with increased pain. This might be qualitatively and anatomically distinct from pain related to disease progression or to breakthrough pain resulting from development of opioid tolerance. Pain associated with hyperalgesia tends  to be more diffuse than the pre-existing pain and less defined in quality. Management of opioid induced hyperalgesia requires opioid dose reduction.  Cancer: Chronic opioid therapy has been associated with an increased risk of cancer among noncancer patients with chronic pain. This association was more evident in chronic strong opioid users. Chronic opioid consumption causes significant pathological changes in the small intestine and colon. Epidemiological studies have found that there is a link between opium dependence and initiation of gastrointestinal cancers. Cancer is the second leading cause of death after cardiovascular disease. Chronic use of opioids can cause multiple conditions such as GERD, immunosuppression and renal damage as well as carcinogenic effects, which are associated with the incidence of cancers.   Mortality: Long-term opioid use has been associated with increased mortality among patients with chronic non-cancer pain (CNCP).  Prescription of long-acting opioids for chronic noncancer pain was associated with a significantly increased risk of all-cause mortality, including deaths from causes other than overdose.  Reference: Von Korff M, Kolodny A, Deyo RA, Chou R. Long-term opioid therapy reconsidered. Ann Intern Med. 2011 Sep 6;155(5):325-8. doi: 10.7326/[id number]. PMID: 09811914; PMCID: [id number]. Randon Goldsmith, Hayward RA, Dunn KM, [place] KP. Risk of adverse events in patients prescribed long-term opioids: A cohort study in the [place] [facility] Research Datalink. Eur J Pain. 2019 May;23(5):908-922. doi: 10.1002/ejp.1357. Epub 2019 Jan 31. PMID: 86578469. Colameco S, Coren JS, Ciervo CA. Continuous opioid treatment for chronic noncancer pain: a time for moderation in prescribing. Postgrad Med. 2009 Jul;121(4):61-6. doi: 10.3810/pgm.2009.07.2032. PMID: 62952841. William Hamburger RN, [place], Blazina I, Cristopher Peru,  Bougatsos C, Deyo RA. The effectiveness and risks of long-term opioid therapy for chronic pain: a systematic review for a [employer] of [employer]ways to [employer]. Ann Intern Med. 2015 Feb 17;162(4):276-86. doi: 10.7326/M14-2559. PMID: 32440102. Caryl Bis [facility], [place] DM. NCHS Data Brief No. 22. [place]: Centers for Disease Control and Prevention; 2009. Sep, Increase in Fatal Poisonings Involving Opioid Analgesics in the [place], 1999-2006. Song IA, Choi HR, Oh TK. Long-term opioid use and mortality in patients with chronic non-cancer pain: Ten-year follow-up study in [place] from 2010 through 2019. EClinicalMedicine. 2022 Jul 18;51:101558. doi: 10.1016/j.eclinm.2022.725366. PMID: 44034742; PMCID: [id number]. Huser, W., Schubert, T., Vogelmann, T. et al. All-cause mortality in patients with long-term opioid therapy compared with non-opioid analgesics for chronic non-cancer pain: a database study. BMC Med 18, 162 (2020). http://lester.info/ Rashidian H, Karie Kirks, Malekzadeh R, Haghdoost AA. An Ecological Study of the  Association between Opiate Use and Incidence of Cancers. Addict Health. 2016 Fall;8(4):252-260. PMID: 16109604; PMCID: [id number].  Our Goal: Our goal is to control your pain with means other than the use of opioid pain medications.  Our Recommendation: Talk to your physician about coming off of these medications. We can assist you with the tapering down and stopping these medicines. Based on the new information, even if you cannot completely stop the medication, a decrease in the dose may be associated with a lesser risk. Ask for other means of controlling the pain. Decrease or eliminate those factors that significantly contribute to your pain such as smoking, obesity, and a diet heavily tilted towards "inflammatory" nutrients.  Last Updated: 02/03/2023    ______________________________________________________________________       ______________________________________________________________________    National Pain Medication Shortage  The [place] is experiencing worsening drug shortages. These have had a negative widespread effect on patient care and treatment. Not expected to improve any time soon. Predicted to last past 2029.   Drug shortage list (generic names) Oxycodone IR Oxycodone/APAP Oxymorphone IR Hydromorphone Hydrocodone/APAP Morphine  Where is the problem?  Manufacturing and supply level.  Will this shortage affect you?  Only if you take any of the above pain medications.  How? You may be unable to fill your prescription.  Your pharmacist may offer a "partial fill" of your prescription. (Warning: Do not accept partial fills.) Prescriptions partially filled cannot be transferred to another pharmacy. Read our Medication Rules and Regulation. Depending on how much medicine you are dependent on, you may experience withdrawals when unable to get the medication.  Recommendations: Consider ending your dependence on opioid pain medications. Ask your pain specialist to assist you with the process. Consider switching to a medication currently not in shortage, such as Buprenorphine. Talk to your pain specialist about this option. Consider decreasing your pain medication requirements by managing tolerance thru "Drug Holidays". This may help minimize withdrawals, should you run out of medicine. Control your pain thru the use of non-pharmacological interventional therapies.   Your prescriber: Prescribers cannot be blamed for shortages. Medication manufacturing and supply issues cannot be fixed by the prescriber.   NOTE: The prescriber is not responsible for supplying the medication, or solving supply issues. Work with your pharmacist to solve it. The patient is responsible for the decision to take or continue taking the  medication and for identifying and securing a legal supply source. By law, supplying the medication is the job and responsibility of the pharmacy. The prescriber is responsible for the evaluation, monitoring, and prescribing of these medications.   Prescribers will NOT: Re-issue prescriptions that have been partially filled. Re-issue prescriptions already sent to a pharmacy.  Re-send prescriptions to a different pharmacy because yours did not have your medication. Ask pharmacist to order more medicine or transfer the prescription to another pharmacy. (Read below.)  New 2023 regulation: "March 29, 2022 Revised Regulation Allows DEA-Registered Pharmacies to Transfer Electronic Prescriptions at a Patient's Request DEA Headquarters Division - Public Information Office Patients now have the ability to request their electronic prescription be transferred to another pharmacy without having to go back to their practitioner to initiate the request. This revised regulation went into effect on Monday, March 25, 2022.     At a patient's request, a DEA-registered retail pharmacy can now transfer an electronic prescription for a controlled substance (schedules II-V) to another DEA-registered retail pharmacy. Prior to this change, patients would have to go through their practitioner to cancel their prescription and  have it re-issued to a different pharmacy. The process was taxing and time consuming for both patients and practitioners.    The Drug Enforcement Administration [facility]) published its intent to revise the process for transferring electronic prescriptions on June 16, 2020.  The final rule was published in the federal register on February 21, 2022 and went into effect 30 days later.  Under the final rule, a prescription can only be transferred once between pharmacies, and only if allowed under existing state or other applicable law. The prescription must remain in its electronic form; may not be altered  in any way; and the transfer must be communicated directly between two licensed pharmacists. It's important to note, any authorized refills transfer with the original prescription, which means the entire prescription will be filled at the same pharmacy".  Reference: [email or web address] [facility] website announcement)  [email or web address] Financial planner of Justice)   [employer] / Vol. 88, No. 143 / Thursday, February 21, 2022 / Rules and Regulations DEPARTMENT OF JUSTICE  Drug Enforcement Administration  21 CFR Part 1306  [Docket No. [id number]]  RIN [id number] Transfer of Electronic Prescriptions for Schedules II-V Controlled Substances Between Pharmacies for Initial Filling  ______________________________________________________________________       ______________________________________________________________________    Transfer of Pain Medication between Pharmacies  Re: 2023 DEA Clarification on existing regulation  Published on DEA Website: March 29, 2022  Title: Revised Regulation Allows DEA-Registered Pharmacies to Electrical engineer Prescriptions at a Patient's Request DEA Headquarters Division - [employer]  "Patients now have the ability to request their electronic prescription be transferred to another pharmacy without having to go back to their practitioner to initiate the request. This revised regulation went into effect on Monday, March 25, 2022.     At a patient's request, a DEA-registered retail pharmacy can now transfer an electronic prescription for a controlled substance (schedules II-V) to another DEA-registered retail pharmacy. Prior to this change, patients would have to go through their practitioner to cancel their prescription and have it re-issued to a different pharmacy. The process was  taxing and time consuming for both patients and practitioners.    The Drug Enforcement Administration [facility]) published its intent to revise the process for transferring electronic prescriptions on June 16, 2020.  The final rule was published in the federal register on February 21, 2022 and went into effect 30 days later.  Under the final rule, a prescription can only be transferred once between pharmacies, and only if allowed under existing state or other applicable law. The prescription must remain in its electronic form; may not be altered in any way; and the transfer must be communicated directly between two licensed pharmacists. It's important to note, any authorized refills transfer with the original prescription, which means the entire prescription will be filled at the same pharmacy."    REFERENCES: 1. DEA website announcement [email or web address]  2. Department of Justice website  [email or web address]  3. DEPARTMENT OF JUSTICE Drug Enforcement Administration 21 CFR Part 1306 [Docket No. [id number]] RIN [id number] "Transfer of Electronic Prescriptions for Schedules II-V Controlled Substances Between Pharmacies for Initial Filling"  ______________________________________________________________________       ______________________________________________________________________    Medication Rules  Purpose: To inform patients, and their family members, of our medication rules and regulations.  Applies to: All patients receiving prescriptions from our practice (written or electronic).  Pharmacy of record: This is the pharmacy where your electronic prescriptions will be sent. Make sure we have the correct one.  Electronic prescriptions: In  compliance with the [facility] Strengthen Opioid Misuse Prevention (STOP) Act of 2017 (Session Conni Elliot  [phone]), effective July 29, 2018, all controlled substances must be electronically prescribed. Written prescriptions, faxing, or calling prescriptions to a pharmacy will no longer be done.  Prescription refills: These will be provided only during in-person appointments. No medications will be renewed without a "face-to-face" evaluation with your provider. Applies to all prescriptions.  NOTE: The following applies primarily to controlled substances (Opioid* Pain Medications).   Type of encounter (visit): For patients receiving controlled substances, face-to-face visits are required. (Not an option and not up to the patient.)  Patient's responsibilities: Pain Pills: Bring all pain pills to every appointment (except for procedure appointments). Pill Bottles: Bring pills in original pharmacy bottle. Bring bottle, even if empty. Always bring the bottle of the most recent fill.  Medication refills: You are responsible for knowing and keeping track of what medications you are taking and when is it that you will need a refill. The day before your appointment: write a list of all prescriptions that need to be refilled. The day of the appointment: give the list to the admitting nurse. Prescriptions will be written only during appointments. No prescriptions will be written on procedure days. If you forget a medication: it will not be "Called in", "Faxed", or "electronically sent". You will need to get another appointment to get these prescribed. No early refills. Do not call asking to have your prescription filled early. Partial  or short prescriptions: Occasionally your pharmacy may not have enough pills to fill your prescription.  NEVER ACCEPT a partial fill or a prescription that is short of the total amount of pills that you were prescribed.  With controlled substances the law allows 72 hours for the pharmacy to complete the prescription.  If the prescription is not completed within 72 hours, the  pharmacist will require a new prescription to be written. This means that you will be short on your medicine and we WILL NOT send another prescription to complete your original prescription.  Instead, request the pharmacy to send a carrier to a nearby branch to get enough medication to provide you with your full prescription. Prescription Accuracy: You are responsible for carefully inspecting your prescriptions before leaving our office. Have the discharge nurse carefully go over each prescription with you, before taking them home. Make sure that your name is accurately spelled, that your address is correct. Check the name and dose of your medication to make sure it is accurate. Check the number of pills, and the written instructions to make sure they are clear and accurate. Make sure that you are given enough medication to last until your next medication refill appointment. Taking Medication: Take medication as prescribed. When it comes to controlled substances, taking less pills or less frequently than prescribed is permitted and encouraged. Never take more pills than instructed. Never take the medication more frequently than prescribed.  Inform other Doctors: Always inform, all of your healthcare providers, of all the medications you take. Pain Medication from other Providers: You are not allowed to accept any additional pain medication from any other Doctor or Healthcare provider. There are two exceptions to this rule. (see below) In the event that you require additional pain medication, you are responsible for notifying us, as stated below. Cough Medicine: Often these contain an opioid, such as codeine or hydrocodone. Never accept or take cough medicine containing these opioids if you are already taking an opioid* medication. The combination may cause  respiratory failure and death. Medication Agreement: You are responsible for carefully reading and following our Medication Agreement. This must be signed  before receiving any prescriptions from our practice. Safely store a copy of your signed Agreement. Violations to the Agreement will result in no further prescriptions. (Additional copies of our Medication Agreement are available upon request.) Laws, Rules, & Regulations: All patients are expected to follow all [address] and [employer], Rules, [employer]. Ignorance of the Laws does not constitute a valid excuse.  Illegal drugs and Controlled Substances: The use of illegal substances (including, but not limited to marijuana and its derivatives) and/or the illegal use of any controlled substances is strictly prohibited. Violation of this rule may result in the immediate and permanent discontinuation of any and all prescriptions being written by our practice. The use of any illegal substances is prohibited. Adopted CDC guidelines & recommendations: Target dosing levels will be at or below 60 MME/day. Use of benzodiazepines** is not recommended.  Exceptions: There are only two exceptions to the rule of not receiving pain medications from other Healthcare Providers. Exception #1 (Emergencies): In the event of an emergency (i.e.: accident requiring emergency care), you are allowed to receive additional pain medication. However, you are responsible for: As soon as you are able, call our office [phone], at any time of the day or night, and leave a message stating your name, the date and nature of the emergency, and the name and dose of the medication prescribed. In the event that your call is answered by a member of our staff, make sure to document and save the date, time, and the name of the person that took your information.  Exception #2 (Planned Surgery): In the event that you are scheduled by another doctor or dentist to have any type of surgery or procedure, you are allowed (for a period no longer than 30 days), to receive additional pain medication, for the acute post-op pain. However, in  this case, you are responsible for picking up a copy of our "Post-op Pain Management for Surgeons" handout, and giving it to your surgeon or dentist. This document is available at our office, and does not require an appointment to obtain it. Simply go to our office during business hours (Monday-Thursday from 8:00 AM to 4:00 PM) (Friday 8:00 AM to 12:00 Noon) or if you have a scheduled appointment with [place], prior to your surgery, and ask for it by name. In addition, you are responsible for: calling our office [phone], at any time of the day or night, and leaving a message stating your name, name of your surgeon, type of surgery, and date of procedure or surgery. Failure to comply with your responsibilities may result in termination of therapy involving the controlled substances. Medication Agreement Violation. Following the above rules, including your responsibilities will help you in avoiding a Medication Agreement Violation ("Breaking your Pain Medication Contract").  Consequences:  Not following the above rules may result in permanent discontinuation of medication prescription therapy.  *Opioid medications include: morphine, codeine, oxycodone, oxymorphone, hydrocodone, hydromorphone, meperidine, tramadol, tapentadol, buprenorphine, fentanyl, methadone. **Benzodiazepine medications include: diazepam (Valium), alprazolam (Xanax), clonazepam (Klonopine), lorazepam (Ativan), clorazepate (Tranxene), chlordiazepoxide (Librium), estazolam (Prosom), oxazepam (Serax), temazepam (Restoril), triazolam (Halcion) (Last updated: 05/21/2022) ______________________________________________________________________      ______________________________________________________________________    Medication Recommendations and Reminders  Applies to: All patients receiving prescriptions (written and/or electronic).  Medication Rules & Regulations: You are responsible for reading, knowing, and following our  "Medication Rules" document.  These exist for your safety and that of others. They are not flexible and neither are we. Dismissing or ignoring them is an act of "non-compliance" that may result in complete and irreversible termination of such medication therapy. For safety reasons, "non-compliance" will not be tolerated. As with the [place] fundamental legal principle of "ignorance of the law is no defense", we will accept no excuses for not having read and knowing the content of documents provided to you by our practice.  Pharmacy of record:  Definition: This is the pharmacy where your electronic prescriptions will be sent.  We do not endorse any particular pharmacy. It is up to you and your insurance to decide what pharmacy to use.  We do not restrict you in your choice of pharmacy. However, once we write for your prescriptions, we will NOT be re-sending more prescriptions to fix restricted supply problems created by your pharmacy, or your insurance.  The pharmacy listed in the electronic medical record should be the one where you want electronic prescriptions to be sent. If you choose to change pharmacy, simply notify our nursing staff. Changes will be made only during your regular appointments and not over the phone.  Recommendations: Keep all of your pain medications in a safe place, under lock and key, even if you live alone. We will NOT replace lost, stolen, or damaged medication. We do not accept "Police Reports" as proof of medications having been stolen. After you fill your prescription, take 1 week's worth of pills and put them away in a safe place. You should keep a separate, properly labeled bottle for this purpose. The remainder should be kept in the original bottle. Use this as your primary supply, until it runs out. Once it's gone, then you know that you have 1 week's worth of medicine, and it is time to come in for a prescription refill. If you do this correctly, it is unlikely that you will  ever run out of medicine. To make sure that the above recommendation works, it is very important that you make sure your medication refill appointments are scheduled at least 1 week before you run out of medicine. To do this in an effective manner, make sure that you do not leave the office without scheduling your next medication management appointment. Always ask the nursing staff to show you in your prescription , when your medication will be running out. Then arrange for the receptionist to get you a return appointment, at least 7 days before you run out of medicine. Do not wait until you have 1 or 2 pills left, to come in. This is very poor planning and does not take into consideration that we may need to cancel appointments due to bad weather, sickness, or emergencies affecting our staff. DO NOT ACCEPT A "Partial Fill": If for any reason your pharmacy does not have enough pills/tablets to completely fill or refill your prescription, do not allow for a "partial fill". The law allows the pharmacy to complete that prescription within 72 hours, without requiring a new prescription. If they do not fill the rest of your prescription within those 72 hours, you will need a separate prescription to fill the remaining amount, which we will NOT provide. If the reason for the partial fill is your insurance, you will need to talk to the pharmacist about payment alternatives for the remaining tablets, but again, DO NOT ACCEPT A PARTIAL FILL, unless you can trust your pharmacist to obtain the remainder of the pills within  72 hours.  Prescription refills and/or changes in medication(s):  Prescription refills, and/or changes in dose or medication, will be conducted only during scheduled medication management appointments. (Applies to both, written and electronic prescriptions.) No refills on procedure days. No medication will be changed or started on procedure days. No changes, adjustments, and/or refills will be  conducted on a procedure day. Doing so will interfere with the diagnostic portion of the procedure. No phone refills. No medications will be "called into the pharmacy". No Fax refills. No weekend refills. No Holliday refills. No after hours refills.  Remember:  Business hours are:  Monday to Thursday 8:00 AM to 4:00 PM Provider's Schedule: Delano Metz, MD - Appointments are:  Medication management: Monday and Wednesday 8:00 AM to 4:00 PM Procedure day: Tuesday and Thursday 7:30 AM to 4:00 PM Edward Jolly, MD - Appointments are:  Medication management: Tuesday and Thursday 8:00 AM to 4:00 PM Procedure day: Monday and Wednesday 7:30 AM to 4:00 PM (Last update: 05/21/2022) ______________________________________________________________________      ______________________________________________________________________     Naloxone Nasal Spray  Why am I receiving this medication? [place] STOP ACT requires that all patients taking high dose opioids or at risk of opioids respiratory depression, be prescribed an opioid reversal agent, such as Naloxone (AKA: Narcan).  What is this medication? NALOXONE (nal OX one) treats opioid overdose, which causes slow or shallow breathing, severe drowsiness, or trouble staying awake. Call emergency services after using this medication. You may need additional treatment. Naloxone works by reversing the effects of opioids. It belongs to a group of medications called opioid blockers.  COMMON BRAND NAME(S): Kloxxado, Narcan  What should I tell my care team before I take this medication? They need to know if you have any of these conditions: Heart disease Substance use disorder An unusual or allergic reaction to naloxone, other medications, foods, dyes, or preservatives Pregnant or trying to get pregnant Breast-feeding  When to use this medication? This medication is to be used for the treatment of respiratory depression (less than 8  breaths per minute) secondary to opioid overdose.   How to use this medication? This medication is for use in the nose. Lay the person on their back. Support their neck with your hand and allow the head to tilt back before giving the medication. The nasal spray should be given into 1 nostril. After giving the medication, move the person onto their side. Do not remove or test the nasal spray until ready to use. Get emergency medical help right away after giving the first dose of this medication, even if the person wakes up. You should be familiar with how to recognize the signs and symptoms of a narcotic overdose. If more doses are needed, give the additional dose in the other nostril. Talk to your care team about the use of this medication in children. While this medication may be prescribed for children as young as newborns for selected conditions, precautions do apply.  Naloxone Overdosage: If you think you have taken too much of this medicine contact a poison control center or emergency room at once.  NOTE: This medicine is only for you. Do not share this medicine with others.  What if I miss a dose? This does not apply.  What may interact with this medication? This is only used during an emergency. No interactions are expected during emergency use. This list may not describe all possible interactions. Give your health care provider a list of all the medicines, herbs,  non-prescription drugs, or dietary supplements you use. Also tell them if you smoke, drink alcohol, or use illegal drugs. Some items may interact with your medicine.  What should I watch for while using this medication? Keep this medication ready for use in the case of an opioid overdose. Make sure that you have the phone number of your care team and local hospital ready. You may need to have additional doses of this medication. Each nasal spray contains a single dose. Some emergencies may require additional doses. After use,  bring the treated person to the nearest hospital or call 911. Make sure the treating care team knows that the person has received a dose of this medication. You will receive additional instructions on what to do during and after use of this medication before an emergency occurs.  What side effects may I notice from receiving this medication? Side effects that you should report to your care team as soon as possible: Allergic reactions--skin rash, itching, hives, swelling of the face, lips, tongue, or throat Side effects that usually do not require medical attention (report these to your care team if they continue or are bothersome): Constipation Dryness or irritation inside the nose Headache Increase in blood pressure Muscle spasms Stuffy nose Toothache This list may not describe all possible side effects. Call your doctor for medical advice about side effects. You may report side effects to FDA at 1-800-FDA-1088.  Where should I keep my medication? Because this is an emergency medication, you should keep it with you at all times.  Keep out of the reach of children and pets. Store between 20 and 25 degrees C (68 and 77 degrees F). Do not freeze. Throw away any unused medication after the expiration date. Keep in original box until ready to use.  NOTE: This sheet is a summary. It may not cover all possible information. If you have questions about this medicine, talk to your doctor, pharmacist, or health care provider.   2023 Elsevier/Gold Standard (2021-03-23 00:00:00)  ______________________________________________________________________     ______________________________________________________________________    Procedure instructions  Stop blood-thinners  Do not eat or drink fluids (other than water) for 6 hours before your procedure  No water for 2 hours before your procedure  Take your blood pressure medicine with a sip of water  Arrive 30 minutes before your  appointment  If sedation is planned, bring suitable driver. Pennie Banter, Benedetto Goad, & public transportation are NOT APPROVED)  Carefully read the "Preparing for your procedure" detailed instructions  If you have questions call us at [phone]  ______________________________________________________________________      ______________________________________________________________________    Preparing for your procedure  Appointments: If you think you may not be able to keep your appointment, call 24-48 hours in advance to cancel. We need time to make it available to others.  During your procedure appointment there will be: No Prescription Refills. No disability issues to discussed. No medication changes or discussions.  Instructions: Food intake: Avoid eating anything solid for at least 8 hours prior to your procedure. Clear liquid intake: You may take clear liquids such as water up to 2 hours prior to your procedure. (No carbonated drinks. No soda.) Transportation: Unless otherwise stated by your physician, bring a driver. (Driver cannot be a Market researcher, Pharmacist, community, or any other form of public transportation.) Morning Medicines: Except for blood thinners, take all of your other morning medications with a sip of water. Make sure to take your heart and blood pressure medicines. If your blood pressure's  lower number is above 100, the case will be rescheduled. Blood thinners: Make sure to stop your blood thinners as instructed.  If you take a blood thinner, but were not instructed to stop it, call our office [phone] and ask to talk to a nurse. Not stopping a blood thinner prior to certain procedures could lead to serious complications. Diabetics on insulin: Notify the staff so that you can be scheduled 1st case in the morning. If your diabetes requires high dose insulin, take only  of your normal insulin dose the morning of the procedure and notify the staff that you have done so. Preventing  infections: Shower with an antibacterial soap the morning of your procedure.  Build-up your immune system: Take 1000 mg of Vitamin C with every meal (3 times a day) the day prior to your procedure. Antibiotics: Inform the nursing staff if you are taking any antibiotics or if you have any conditions that may require antibiotics prior to procedures. (Example: recent joint implants)   Pregnancy: If you are pregnant make sure to notify the nursing staff. Not doing so may result in injury to the fetus, including death.  Sickness: If you have a cold, fever, or any active infections, call and cancel or reschedule your procedure. Receiving steroids while having an infection may result in complications. Arrival: You must be in the facility at least 30 minutes prior to your scheduled procedure. Tardiness: Your scheduled time is also the cutoff time. If you do not arrive at least 15 minutes prior to your procedure, you will be rescheduled.  Children: Do not bring any children with you. Make arrangements to keep them home. Dress appropriately: There is always a possibility that your clothing may get soiled. Avoid long dresses. Valuables: Do not bring any jewelry or valuables.  Reasons to call and reschedule or cancel your procedure: (Following these recommendations will minimize the risk of a serious complication.) Surgeries: Avoid having procedures within 2 weeks of any surgery. (Avoid for 2 weeks before or after any surgery). Flu Shots: Avoid having procedures within 2 weeks of a flu shots or . (Avoid for 2 weeks before or after immunizations). Barium: Avoid having a procedure within 7-10 days after having had a radiological study involving the use of radiological contrast. (Myelograms, Barium swallow or enema study). Heart attacks: Avoid any elective procedures or surgeries for the initial 6 months after a "Myocardial Infarction" (Heart Attack). Blood thinners: It is imperative that you stop these medications  before procedures. Let us know if you if you take any blood thinner.  Infection: Avoid procedures during or within two weeks of an infection (including chest colds or gastrointestinal problems). Symptoms associated with infections include: Localized redness, fever, chills, night sweats or profuse sweating, burning sensation when voiding, cough, congestion, stuffiness, runny nose, sore throat, diarrhea, nausea, vomiting, cold or Flu symptoms, recent or current infections. It is specially important if the infection is over the area that we intend to treat. Heart and lung problems: Symptoms that may suggest an active cardiopulmonary problem include: cough, chest pain, breathing difficulties or shortness of breath, dizziness, ankle swelling, uncontrolled high or unusually low blood pressure, and/or palpitations. If you are experiencing any of these symptoms, cancel your procedure and contact your primary care physician for an evaluation.  Remember:  Regular Business hours are:  Monday to Thursday 8:00 AM to 4:00 PM  Provider's Schedule: Delano Metz, MD:  Procedure days: Tuesday and Thursday 7:30 AM to 4:00 PM  Edward Jolly, MD:  Procedure days: Monday and Wednesday 7:30 AM to 4:00 PM Last  Updated: 03/18/2023 ______________________________________________________________________      ______________________________________________________________________    General Risks and Possible Complications  Patient Responsibilities: It is important that you read this as it is part of your informed consent. It is our duty to inform you of the risks and possible complications associated with treatments offered to you. It is your responsibility as a patient to read this and to ask questions about anything that is not clear or that you believe was not covered in this document.  Patient's Rights: You have the right to refuse treatment. You also have the right to change your mind, even after initially  having agreed to have the treatment done. However, under this last option, if you wait until the last second to change your mind, you may be charged for the materials used up to that point.  Introduction: Medicine is not an Visual merchandiser. Everything in Medicine, including the lack of treatment(s), carries the potential for danger, harm, or loss (which is by definition: Risk). In Medicine, a complication is a secondary problem, condition, or disease that can aggravate an already existing one. All treatments carry the risk of possible complications. The fact that a side effects or complications occurs, does not imply that the treatment was conducted incorrectly. It must be clearly understood that these can happen even when everything is done following the highest safety standards.  No treatment: You can choose not to proceed with the proposed treatment alternative. The "PRO(s)" would include: avoiding the risk of complications associated with the therapy. The "CON(s)" would include: not getting any of the treatment benefits. These benefits fall under one of three categories: diagnostic; therapeutic; and/or palliative. Diagnostic benefits include: getting information which can ultimately lead to improvement of the disease or symptom(s). Therapeutic benefits are those associated with the successful treatment of the disease. Finally, palliative benefits are those related to the decrease of the primary symptoms, without necessarily curing the condition (example: decreasing the pain from a flare-up of a chronic condition, such as incurable terminal cancer).  General Risks and Complications: These are associated to most interventional treatments. They can occur alone, or in combination. They fall under one of the following six (6) categories: no benefit or worsening of symptoms; bleeding; infection; nerve damage; allergic reactions; and/or death. No benefits or worsening of symptoms: In Medicine there are no  guarantees, only probabilities. No healthcare provider can ever guarantee that a medical treatment will work, they can only state the probability that it may. Furthermore, there is always the possibility that the condition may worsen, either directly, or indirectly, as a consequence of the treatment. Bleeding: This is more common if the patient is taking a blood thinner, either prescription or over the counter (example: Goody Powders, Fish oil, Aspirin, Garlic, etc.), or if suffering a condition associated with impaired coagulation (example: Hemophilia, cirrhosis of the liver, low platelet counts, etc.). However, even if you do not have one on these, it can still happen. If you have any of these conditions, or take one of these drugs, make sure to notify your treating physician. Infection: This is more common in patients with a compromised immune system, either due to disease (example: diabetes, cancer, human immunodeficiency virus [HIV], etc.), or due to medications or treatments (example: therapies used to treat cancer and rheumatological diseases). However, even if you do not have one on these, it can still happen. If you have any of these conditions, or take  one of these drugs, make sure to notify your treating physician. Nerve Damage: This is more common when the treatment is an invasive one, but it can also happen with the use of medications, such as those used in the treatment of cancer. The damage can occur to small secondary nerves, or to large primary ones, such as those in the spinal cord and brain. This damage may be temporary or permanent and it may lead to impairments that can range from temporary numbness to permanent paralysis and/or brain death. Allergic Reactions: Any time a substance or material comes in contact with our body, there is the possibility of an allergic reaction. These can range from a mild skin rash (contact dermatitis) to a severe systemic reaction (anaphylactic reaction), which  can result in death. Death: In general, any medical intervention can result in death, most of the time due to an unforeseen complication. ______________________________________________________________________

## 2023-04-07 NOTE — Progress Notes (Signed)
Nursing Pain Medication Assessment:  Safety precautions to be maintained throughout the outpatient stay will include: orient to surroundings, keep bed in low position, maintain call bell within reach at all times, provide assistance with transfer out of bed and ambulation.  Medication Inspection Compliance: Pill count conducted under aseptic conditions, in front of the patient. Neither the pills nor the bottle was removed from the patient's sight at any time. Once count was completed pills were immediately returned to the patient in their original bottle.  Medication: Tramadol (Ultram) Pill/Patch Count:  38 of 90 pills remain Pill/Patch Appearance: Markings consistent with prescribed medication Bottle Appearance: Standard pharmacy container. Clearly labeled. Filled Date: 8 / 8 / 2024 Last Medication intake:  YesterdaySafety precautions to be maintained throughout the outpatient stay will include: orient to surroundings, keep bed in low position, maintain call bell within reach at all times, provide assistance with transfer out of bed and ambulation.

## 2023-04-09 ENCOUNTER — Other Ambulatory Visit: Payer: Self-pay | Admitting: Family Medicine

## 2023-04-09 DIAGNOSIS — I129 Hypertensive chronic kidney disease with stage 1 through stage 4 chronic kidney disease, or unspecified chronic kidney disease: Secondary | ICD-10-CM

## 2023-04-09 LAB — TOXASSURE SELECT 13 (MW), URINE

## 2023-04-10 ENCOUNTER — Telehealth: Payer: Self-pay

## 2023-04-10 DIAGNOSIS — I129 Hypertensive chronic kidney disease with stage 1 through stage 4 chronic kidney disease, or unspecified chronic kidney disease: Secondary | ICD-10-CM

## 2023-04-10 DIAGNOSIS — R7309 Other abnormal glucose: Secondary | ICD-10-CM

## 2023-04-10 DIAGNOSIS — E034 Atrophy of thyroid (acquired): Secondary | ICD-10-CM

## 2023-04-10 NOTE — Telephone Encounter (Signed)
Copied from CRM [id number]. Topic: Appointment Scheduling - Scheduling Inquiry for Clinic >> Apr 10, 2023 11:31 AM Marlow Baars wrote: Reason for CRM: The daughter of the patient called to make the patient a 6 month follow up because she hasn't done that yet from last year. She is also requesting to see if her provider would like to schedule lab work before he sees her on October 1st. If so please let them know

## 2023-04-11 ENCOUNTER — Other Ambulatory Visit: Payer: Self-pay | Admitting: Pain Medicine

## 2023-04-11 DIAGNOSIS — M5135 Other intervertebral disc degeneration, thoracolumbar region: Secondary | ICD-10-CM

## 2023-04-11 DIAGNOSIS — M4854XS Collapsed vertebra, not elsewhere classified, thoracic region, sequela of fracture: Secondary | ICD-10-CM

## 2023-04-11 DIAGNOSIS — Z79899 Other long term (current) drug therapy: Secondary | ICD-10-CM

## 2023-04-11 DIAGNOSIS — G8929 Other chronic pain: Secondary | ICD-10-CM

## 2023-04-11 DIAGNOSIS — Z79891 Long term (current) use of opiate analgesic: Secondary | ICD-10-CM

## 2023-04-11 DIAGNOSIS — M47816 Spondylosis without myelopathy or radiculopathy, lumbar region: Secondary | ICD-10-CM

## 2023-04-11 DIAGNOSIS — S22080S Wedge compression fracture of T11-T12 vertebra, sequela: Secondary | ICD-10-CM

## 2023-04-11 DIAGNOSIS — G894 Chronic pain syndrome: Secondary | ICD-10-CM

## 2023-04-11 DIAGNOSIS — M5137 Other intervertebral disc degeneration, lumbosacral region: Secondary | ICD-10-CM

## 2023-04-14 NOTE — Telephone Encounter (Signed)
Please contact patient's daughter and schedule apt for Lab Only whenever they request, anytime within 1 week of the apt 04/29/23.  I will place orders for labs.  Saralyn Pilar, DO [facility] 04/14/2023, 11:59 AM

## 2023-04-14 NOTE — Telephone Encounter (Signed)
Lab appointment scheduled per patient/caregiver preference.

## 2023-04-15 ENCOUNTER — Other Ambulatory Visit: Payer: Self-pay | Admitting: Family Medicine

## 2023-04-15 DIAGNOSIS — F419 Anxiety disorder, unspecified: Secondary | ICD-10-CM

## 2023-04-15 DIAGNOSIS — F5101 Primary insomnia: Secondary | ICD-10-CM

## 2023-04-21 ENCOUNTER — Other Ambulatory Visit: Payer: Medicare Other

## 2023-04-21 DIAGNOSIS — R7309 Other abnormal glucose: Secondary | ICD-10-CM | POA: Diagnosis not present

## 2023-04-21 DIAGNOSIS — N183 Chronic kidney disease, stage 3 unspecified: Secondary | ICD-10-CM | POA: Diagnosis not present

## 2023-04-21 DIAGNOSIS — E034 Atrophy of thyroid (acquired): Secondary | ICD-10-CM

## 2023-04-21 DIAGNOSIS — I129 Hypertensive chronic kidney disease with stage 1 through stage 4 chronic kidney disease, or unspecified chronic kidney disease: Secondary | ICD-10-CM | POA: Diagnosis not present

## 2023-04-22 LAB — COMPLETE METABOLIC PANEL WITH GFR
AG Ratio: 1.3 (calc) (ref 1.0–2.5)
ALT: 6 U/L (ref 6–29)
AST: 8 U/L — ABNORMAL LOW (ref 10–35)
Albumin: 4 g/dL (ref 3.6–5.1)
Alkaline phosphatase (APISO): 71 U/L (ref 37–153)
BUN: 9 mg/dL (ref 7–25)
CO2: 29 mmol/L (ref 20–32)
Calcium: 9.3 mg/dL (ref 8.6–10.4)
Chloride: 99 mmol/L (ref 98–110)
Creat: 0.79 mg/dL (ref 0.60–0.95)
Globulin: 3.1 g/dL (calc) (ref 1.9–3.7)
Glucose, Bld: 105 mg/dL — ABNORMAL HIGH (ref 65–99)
Potassium: 4.2 mmol/L (ref 3.5–5.3)
Sodium: 137 mmol/L (ref 135–146)
Total Bilirubin: 0.4 mg/dL (ref 0.2–1.2)
Total Protein: 7.1 g/dL (ref 6.1–8.1)
eGFR: 70 mL/min/{1.73_m2} (ref >=60)

## 2023-04-22 LAB — HEMOGLOBIN A1C
Hgb A1c MFr Bld: 5.8 % of total Hgb — ABNORMAL HIGH (ref <5.7)
Mean Plasma Glucose: 120 mg/dL
eAG (mmol/L): 6.6 mmol/L

## 2023-04-22 LAB — LIPID PANEL
Cholesterol: 220 mg/dL — ABNORMAL HIGH (ref <200)
HDL: 43 mg/dL — ABNORMAL LOW (ref >=50)
LDL Cholesterol (Calc): 151 mg/dL (calc) — ABNORMAL HIGH
Non-HDL Cholesterol (Calc): 177 mg/dL (calc) — ABNORMAL HIGH (ref <130)
Total CHOL/HDL Ratio: 5.1 (calc) — ABNORMAL HIGH (ref <5.0)
Triglycerides: 140 mg/dL (ref <150)

## 2023-04-22 LAB — CBC WITH DIFFERENTIAL/PLATELET
Absolute Monocytes: 451 cells/uL (ref 200–950)
Basophils Absolute: 22 cells/uL (ref 0–200)
Basophils Relative: 0.4 %
Eosinophils Absolute: 22 cells/uL (ref 15–500)
Eosinophils Relative: 0.4 %
HCT: 39 % (ref 35.0–45.0)
Hemoglobin: 11.5 g/dL — ABNORMAL LOW (ref 11.7–15.5)
Lymphs Abs: 1722 cells/uL (ref 850–3900)
MCH: 23.7 pg — ABNORMAL LOW (ref 27.0–33.0)
MCHC: 29.5 g/dL — ABNORMAL LOW (ref 32.0–36.0)
MCV: 80.4 fL (ref 80.0–100.0)
MPV: 10.1 fL (ref 7.5–12.5)
Monocytes Relative: 8.2 %
Neutro Abs: 3284 cells/uL (ref 1500–7800)
Neutrophils Relative %: 59.7 %
Platelets: 233 10*3/uL (ref 140–400)
RBC: 4.85 10*6/uL (ref 3.80–5.10)
RDW: 14.2 % (ref 11.0–15.0)
Total Lymphocyte: 31.3 %
WBC: 5.5 10*3/uL (ref 3.8–10.8)

## 2023-04-22 LAB — TSH: TSH: 1.54 mIU/L (ref 0.40–4.50)

## 2023-04-22 LAB — T4, FREE: Free T4: 1.6 ng/dL (ref 0.8–1.8)

## 2023-04-29 ENCOUNTER — Encounter: Payer: Self-pay | Admitting: Family Medicine

## 2023-04-29 ENCOUNTER — Ambulatory Visit (INDEPENDENT_AMBULATORY_CARE_PROVIDER_SITE_OTHER): Payer: Medicare Other | Admitting: Family Medicine

## 2023-04-29 VITALS — BP 130/72 | HR 55 | Ht 63.0 in | Wt 117.0 lb

## 2023-04-29 DIAGNOSIS — I129 Hypertensive chronic kidney disease with stage 1 through stage 4 chronic kidney disease, or unspecified chronic kidney disease: Secondary | ICD-10-CM

## 2023-04-29 DIAGNOSIS — G894 Chronic pain syndrome: Secondary | ICD-10-CM | POA: Diagnosis not present

## 2023-04-29 DIAGNOSIS — Z23 Encounter for immunization: Secondary | ICD-10-CM

## 2023-04-29 DIAGNOSIS — M6281 Muscle weakness (generalized): Secondary | ICD-10-CM | POA: Diagnosis not present

## 2023-04-29 DIAGNOSIS — M1711 Unilateral primary osteoarthritis, right knee: Secondary | ICD-10-CM

## 2023-04-29 DIAGNOSIS — N183 Chronic kidney disease, stage 3 unspecified: Secondary | ICD-10-CM | POA: Diagnosis not present

## 2023-04-29 DIAGNOSIS — H9193 Unspecified hearing loss, bilateral: Secondary | ICD-10-CM | POA: Diagnosis not present

## 2023-04-29 DIAGNOSIS — R296 Repeated falls: Secondary | ICD-10-CM

## 2023-04-29 DIAGNOSIS — E034 Atrophy of thyroid (acquired): Secondary | ICD-10-CM | POA: Diagnosis not present

## 2023-04-29 NOTE — Progress Notes (Signed)
Subjective:    Patient ID: [name], female    DOB: [birth year], 89 y.o.   MRN: [id number]  SHENETA RUAN is a 89 y.o. female presenting on 04/29/2023 for Hypothyroidism and Hypertension  History provided by patient's daughter. Okey Regal Patient is hard of hearing.  HPI  Discussed the use of AI scribe software for clinical note transcription with the patient, who gave verbal consent to proceed.     The patient, a 89 year old with a history of chronic back pain and kidney issues, presents for a routine check-up after a year. The primary concern is the progressive weakness in the patient's legs, which has been worsening over time. The patient reports difficulty walking across a room without feeling like her legs are about to give way. The patient also reports a recent fall at a beauty shop due to leg weakness, but did not sustain any injuries. The patient identifies the right knee as a particular source of trouble, which was reportedly swollen a week prior to the visit.  The patient lives independently. She uses a cane for ambulation. She is a high fall risk, and both her and her daughter are interested in strengthening her lower extremities. Asking about exercise bike equipment for home and PT options for home health to come out to her.  The patient has also expressed a desire for a hearing aid, as she has been experiencing hearing difficulties.  [name], who accompanied the patient to the visit, has been managing the patient's appointments with various specialists, including pain doctors and kidney doctors. The patient is due for a flu shot, which was last administered in November of the previous year. [name] medications have been managed by the daughter and are reported to be taken as prescribed.      HTN CKD Recurrent UTI history Followed by Nephrology Previously on antibiotic prophylaxis for UTI   Followed by Pain Management - multiple joint pain /  osteoarthritis, chronic pain. On procedure management.   RLS Essential Temors History of whole body quivering and tremor previously Keeping up at night with RLS Episodes of sweating, perspiring at times. Improved on Gabapentin  Anemia Stable Hgb   Anxiety Improved managed on Clonazepam  Health Maintenance: Flu Shot today      04/29/2023    2:47 PM 06/24/2022    8:51 AM 04/30/2022   10:37 AM  Depression screen PHQ 2/9  Decreased Interest 0 0 0  Down, Depressed, Hopeless 0 0 0  PHQ - 2 Score 0 0 0  Altered sleeping 0 0   Tired, decreased energy 3 0   Change in appetite 0 0   Feeling bad or failure about yourself  0 0   Trouble concentrating 0 0   Moving slowly or fidgety/restless 0 0   Suicidal thoughts 0 0   PHQ-9 Score 3 0   Difficult doing work/chores Not difficult at all Not difficult at all     Social History   Tobacco Use   Smoking status: Never   Smokeless tobacco: Never  Vaping Use   Vaping status: Never Used  Substance Use Topics   Alcohol use: No    Alcohol/week: 0.0 standard drinks of alcohol   Drug use: No    Review of Systems  HENT:  Positive for hearing loss.    Per HPI unless specifically indicated above     Objective:    BP 130/72   Pulse (!) 55   Ht 5\' 3"  (1.6 m)  Wt 117 lb (53.1 kg)   SpO2 96%   BMI 20.73 kg/m   Wt Readings from Last 3 Encounters:  04/29/23 117 lb (53.1 kg)  04/07/23 120 lb (54.4 kg)  03/04/23 120 lb (54.4 kg)    Physical Exam Vitals and nursing note reviewed.  Constitutional:      General: She is not in acute distress.    Appearance: Normal appearance. She is well-developed. She is not diaphoretic.     Comments: Elderly 89 yr female, currently well, comfortable, cooperative  HENT:     Head: Normocephalic and atraumatic.     Comments: Hard of hearing. Limited participation in conversation. Eyes:     General:        Right eye: No discharge.        Left eye: No discharge.     Conjunctiva/sclera:  Conjunctivae normal.  Cardiovascular:     Rate and Rhythm: Normal rate.  Pulmonary:     Effort: Pulmonary effort is normal.  Musculoskeletal:     Right lower leg: No edema.     Left lower leg: No edema.     Comments: R knee crepitus has range of motion intact. Generalized muscle atrophy lower extremity Has cane  Lower extremity strength testing 4/5 bilateral hip and knee muscles  Skin:    General: Skin is warm and dry.     Findings: No erythema or rash.  Neurological:     Mental Status: She is alert and oriented to person, place, and time.  Psychiatric:        Mood and Affect: Mood normal.        Behavior: Behavior normal.        Thought Content: Thought content normal.     Comments: Well groomed, good eye contact, normal speech and thoughts       Results for orders placed or performed in visit on 04/21/23  T4, free  Result Value Ref Range   Free T4 1.6 0.8 - 1.8 ng/dL  TSH  Result Value Ref Range   TSH 1.54 0.40 - 4.50 mIU/L  CBC with Differential/Platelet  Result Value Ref Range   WBC 5.5 3.8 - 10.8 Thousand/uL   RBC 4.85 3.80 - 5.10 Million/uL   Hemoglobin 11.5 (L) 11.7 - 15.5 g/dL   HCT 16.1 09.6 - 04.5 %   MCV 80.4 80.0 - 100.0 fL   MCH 23.7 (L) 27.0 - 33.0 pg   MCHC 29.5 (L) 32.0 - 36.0 g/dL   RDW 40.9 81.1 - 91.4 %   Platelets 233 140 - 400 Thousand/uL   MPV 10.1 7.5 - 12.5 fL   Neutro Abs 3,284 1,500 - 7,800 cells/uL   Lymphs Abs 1,722 850 - 3,900 cells/uL   Absolute Monocytes 451 200 - 950 cells/uL   Eosinophils Absolute 22 15 - 500 cells/uL   Basophils Absolute 22 0 - 200 cells/uL   Neutrophils Relative % 59.7 %   Total Lymphocyte 31.3 %   Monocytes Relative 8.2 %   Eosinophils Relative 0.4 %   Basophils Relative 0.4 %  COMPLETE METABOLIC PANEL WITH GFR  Result Value Ref Range   Glucose, Bld 105 (H) 65 - 99 mg/dL   BUN 9 7 - 25 mg/dL   Creat 7.82 9.56 - 2.13 mg/dL   eGFR 70 > OR = 60 YQ/MVH/8.46N6   BUN/Creatinine Ratio SEE NOTE: 6 - 22 (calc)    Sodium 137 135 - 146 mmol/L   Potassium 4.2 3.5 - 5.3 mmol/L  Chloride 99 98 - 110 mmol/L   CO2 29 20 - 32 mmol/L   Calcium 9.3 8.6 - 10.4 mg/dL   Total Protein 7.1 6.1 - 8.1 g/dL   Albumin 4.0 3.6 - 5.1 g/dL   Globulin 3.1 1.9 - 3.7 g/dL (calc)   AG Ratio 1.3 1.0 - 2.5 (calc)   Total Bilirubin 0.4 0.2 - 1.2 mg/dL   Alkaline phosphatase (APISO) 71 37 - 153 U/L   AST 8 (L) 10 - 35 U/L   ALT 6 6 - 29 U/L  Lipid panel  Result Value Ref Range   Cholesterol 220 (H) <200 mg/dL   HDL 43 (L) > OR = 50 mg/dL   Triglycerides 161 <096 mg/dL   LDL Cholesterol (Calc) 151 (H) mg/dL (calc)   Total CHOL/HDL Ratio 5.1 (H) <5.0 (calc)   Non-HDL Cholesterol (Calc) 177 (H) <130 mg/dL (calc)  Hemoglobin E4V  Result Value Ref Range   Hgb A1c MFr Bld 5.8 (H) <5.7 % of total Hgb   Mean Plasma Glucose 120 mg/dL   eAG (mmol/L) 6.6 mmol/L      Assessment & Plan:   Problem List Items Addressed This Visit     Chronic pain syndrome (Chronic)   Benign hypertension with CKD (chronic kidney disease) stage III (HCC) - Primary   Hypothyroidism   Other Visit Diagnoses     Need for influenza vaccination       Relevant Orders   Flu Vaccine Trivalent High Dose (Fluad) (Completed)       Assessment and Plan    Leg Weakness, bilateral Osteoarthritis multiple joints Chronic Pain Recurrent Falls  Patient reports increasing weakness in legs, particularly right side, with difficulty walking and occasional falls. Discussed potential causes including back issues and knee arthritis. She has low body weight High Fall Risk  Continue to use cane / walker support  -Order home health physical therapy to improve strength and mobility. -Consider purchase of a pedal exercise device for home use to improve leg strength. -Schedule cortisone injection for right knee in 3 weeks (05/20/2023 at 2:20pm) to potentially alleviate pain and improve function.  Hearing Loss bilateral -Refer to audiology department for  hearing test and discussion of hearing aid options.  General Health Maintenance -Administer influenza vaccine today. -Continue current medications as prescribed. -Follow-up in 3 weeks for knee injection.        Orders Placed This Encounter  Procedures   Flu Vaccine Trivalent High Dose (Fluad)   Ambulatory referral to Home Health    Referral Priority:   Routine    Referral Type:   Home Health Care    Referral Reason:   Specialty Services Required    Requested Specialty:   Home Health Services    Number of Visits Requested:   1   Ambulatory referral to Audiology    Referral Priority:   Routine    Referral Type:   Audiology Exam    Referral Reason:   Specialty Services Required    Number of Visits Requested:   1     No orders of the defined types were placed in this encounter.     Follow up plan: Return if symptoms worsen or fail to improve.    Saralyn Pilar, DO [facility] 04/29/2023, 3:09 PM

## 2023-04-29 NOTE — Patient Instructions (Addendum)
Thank you for coming to the office today.  Flu Shot today  Return in 3 weeks for Right Knee cortisone injection for knee pain and weakness.  Try home portable pedal exercise machine, she can sit in chair and use machine in front of her  ------------------------------------  Referral to Home Health Physical Therapy. - few times a week once arranged, home health PT specialist.  --------------------------  Referral to:  [facility] - Audiology department Located in: [facility] Address: [address], [place], [place] [address] Phone: [phone]  Please schedule a Follow-up Appointment to: Return if symptoms worsen or fail to improve.  If you have any other questions or concerns, please feel free to call the office or send a message through MyChart. You may also schedule an earlier appointment if necessary.  Additionally, you may be receiving a survey about your experience at our office within a few days to 1 week by e-mail or mail. We value your feedback.  Saralyn Pilar, DO [facility] [place]

## 2023-05-02 DIAGNOSIS — I129 Hypertensive chronic kidney disease with stage 1 through stage 4 chronic kidney disease, or unspecified chronic kidney disease: Secondary | ICD-10-CM | POA: Diagnosis not present

## 2023-05-02 DIAGNOSIS — N183 Chronic kidney disease, stage 3 unspecified: Secondary | ICD-10-CM | POA: Diagnosis not present

## 2023-05-02 DIAGNOSIS — G2581 Restless legs syndrome: Secondary | ICD-10-CM | POA: Diagnosis not present

## 2023-05-02 DIAGNOSIS — M62569 Muscle wasting and atrophy, not elsewhere classified, unspecified lower leg: Secondary | ICD-10-CM | POA: Diagnosis not present

## 2023-05-02 DIAGNOSIS — E034 Atrophy of thyroid (acquired): Secondary | ICD-10-CM | POA: Diagnosis not present

## 2023-05-02 DIAGNOSIS — Z87440 Personal history of urinary (tract) infections: Secondary | ICD-10-CM | POA: Diagnosis not present

## 2023-05-02 DIAGNOSIS — Z79891 Long term (current) use of opiate analgesic: Secondary | ICD-10-CM | POA: Diagnosis not present

## 2023-05-02 DIAGNOSIS — R296 Repeated falls: Secondary | ICD-10-CM | POA: Diagnosis not present

## 2023-05-02 DIAGNOSIS — G250 Essential tremor: Secondary | ICD-10-CM | POA: Diagnosis not present

## 2023-05-02 DIAGNOSIS — F419 Anxiety disorder, unspecified: Secondary | ICD-10-CM | POA: Diagnosis not present

## 2023-05-02 DIAGNOSIS — G894 Chronic pain syndrome: Secondary | ICD-10-CM | POA: Diagnosis not present

## 2023-05-02 DIAGNOSIS — M159 Polyosteoarthritis, unspecified: Secondary | ICD-10-CM | POA: Diagnosis not present

## 2023-05-02 DIAGNOSIS — H9193 Unspecified hearing loss, bilateral: Secondary | ICD-10-CM | POA: Diagnosis not present

## 2023-05-02 DIAGNOSIS — Z9181 History of falling: Secondary | ICD-10-CM | POA: Diagnosis not present

## 2023-05-02 DIAGNOSIS — D631 Anemia in chronic kidney disease: Secondary | ICD-10-CM | POA: Diagnosis not present

## 2023-05-02 DIAGNOSIS — Z556 Problems related to health literacy: Secondary | ICD-10-CM | POA: Diagnosis not present

## 2023-05-06 DIAGNOSIS — G2581 Restless legs syndrome: Secondary | ICD-10-CM | POA: Diagnosis not present

## 2023-05-06 DIAGNOSIS — D631 Anemia in chronic kidney disease: Secondary | ICD-10-CM | POA: Diagnosis not present

## 2023-05-06 DIAGNOSIS — I129 Hypertensive chronic kidney disease with stage 1 through stage 4 chronic kidney disease, or unspecified chronic kidney disease: Secondary | ICD-10-CM | POA: Diagnosis not present

## 2023-05-06 DIAGNOSIS — N183 Chronic kidney disease, stage 3 unspecified: Secondary | ICD-10-CM | POA: Diagnosis not present

## 2023-05-06 DIAGNOSIS — M159 Polyosteoarthritis, unspecified: Secondary | ICD-10-CM | POA: Diagnosis not present

## 2023-05-06 DIAGNOSIS — E034 Atrophy of thyroid (acquired): Secondary | ICD-10-CM | POA: Diagnosis not present

## 2023-05-07 ENCOUNTER — Other Ambulatory Visit: Payer: Self-pay | Admitting: Family Medicine

## 2023-05-07 DIAGNOSIS — I129 Hypertensive chronic kidney disease with stage 1 through stage 4 chronic kidney disease, or unspecified chronic kidney disease: Secondary | ICD-10-CM

## 2023-05-07 NOTE — Telephone Encounter (Signed)
Requested Prescriptions  Pending Prescriptions Disp Refills   furosemide (LASIX) 20 MG tablet [Pharmacy Med Name: FUROSEMIDE 20 MG TAB] 135 tablet 0    Sig: TAKE 1 and 1/2 TABLETS BY MOUTH ONCE DAILY     Cardiovascular:  Diuretics - Loop Failed - 05/07/2023 11:01 AM      Failed - Mg Level in normal range and within 180 days    Magnesium  Date Value Ref Range Status  01/14/2014 2.1 mg/dL Final    Comment:    1.6-1.0 THERAPEUTIC RANGE: 4-7 mg/dL TOXIC: > 10 mg/dL  -----------------------          Passed - K in normal range and within 180 days    Potassium  Date Value Ref Range Status  04/21/2023 4.2 3.5 - 5.3 mmol/L Final  11/24/2014 4.2 mmol/L Final    Comment:    3.5-5.1 NOTE: New Reference Range  10/04/14          Passed - Ca in normal range and within 180 days    Calcium  Date Value Ref Range Status  04/21/2023 9.3 8.6 - 10.4 mg/dL Final   Calcium, Total  Date Value Ref Range Status  11/24/2014 8.9 mg/dL Final    Comment:    9.6-04.5 NOTE: New Reference Range  10/04/14          Passed - Na in normal range and within 180 days    Sodium  Date Value Ref Range Status  04/21/2023 137 135 - 146 mmol/L Final  11/24/2014 131 (L) mmol/L Final    Comment:    135-145 NOTE: New Reference Range  10/04/14          Passed - Cr in normal range and within 180 days    Creat  Date Value Ref Range Status  04/21/2023 0.79 0.60 - 0.95 mg/dL Final         Passed - Cl in normal range and within 180 days    Chloride  Date Value Ref Range Status  04/21/2023 99 98 - 110 mmol/L Final  11/24/2014 97 (L) mmol/L Final    Comment:    101-111 NOTE: New Reference Range  10/04/14          Passed - Last BP in normal range    BP Readings from Last 1 Encounters:  04/29/23 130/72         Passed - Valid encounter within last 6 months    Recent Outpatient Visits           1 week ago Benign hypertension with CKD (chronic kidney disease) stage III [facility])   [place] [facility] Smitty Cords, DO   10 months ago Annual physical exam   [place] [facility] Smitty Cords, DO   1 year ago RLS (restless legs syndrome)   [place] [facility] Smitty Cords, DO   1 year ago Benign hypertension with CKD (chronic kidney disease) stage III [facility])   [place] [facility] Smitty Cords, DO   2 years ago Benign hypertension with CKD (chronic kidney disease) stage III [facility])   [place] [facility] Smitty Cords, DO       Future Appointments             In 1 week Althea Charon, Netta Neat, DO [place] [facility]   In 1 month Alfredo Martinez, MD [facility]

## 2023-05-08 ENCOUNTER — Telehealth: Payer: Self-pay | Admitting: Family Medicine

## 2023-05-08 NOTE — Telephone Encounter (Signed)
Gerri Spore PT calling from [facility] is calling to request continuation of Orders for PT  Frequency-  2 W 4  1 W 4  CB- [phone] Verbal ok on VM

## 2023-05-09 NOTE — Telephone Encounter (Signed)
Okay for verbal orders as requested? 

## 2023-05-12 NOTE — Telephone Encounter (Signed)
Spoke with Gerri Spore and approved orders per provider.

## 2023-05-13 DIAGNOSIS — G2581 Restless legs syndrome: Secondary | ICD-10-CM | POA: Diagnosis not present

## 2023-05-13 DIAGNOSIS — N183 Chronic kidney disease, stage 3 unspecified: Secondary | ICD-10-CM | POA: Diagnosis not present

## 2023-05-13 DIAGNOSIS — I129 Hypertensive chronic kidney disease with stage 1 through stage 4 chronic kidney disease, or unspecified chronic kidney disease: Secondary | ICD-10-CM | POA: Diagnosis not present

## 2023-05-13 DIAGNOSIS — M159 Polyosteoarthritis, unspecified: Secondary | ICD-10-CM | POA: Diagnosis not present

## 2023-05-13 DIAGNOSIS — D631 Anemia in chronic kidney disease: Secondary | ICD-10-CM | POA: Diagnosis not present

## 2023-05-13 DIAGNOSIS — E034 Atrophy of thyroid (acquired): Secondary | ICD-10-CM | POA: Diagnosis not present

## 2023-05-14 ENCOUNTER — Other Ambulatory Visit: Payer: Self-pay | Admitting: Family Medicine

## 2023-05-14 DIAGNOSIS — H6121 Impacted cerumen, right ear: Secondary | ICD-10-CM | POA: Diagnosis not present

## 2023-05-14 DIAGNOSIS — E034 Atrophy of thyroid (acquired): Secondary | ICD-10-CM

## 2023-05-14 DIAGNOSIS — H903 Sensorineural hearing loss, bilateral: Secondary | ICD-10-CM | POA: Diagnosis not present

## 2023-05-14 DIAGNOSIS — I129 Hypertensive chronic kidney disease with stage 1 through stage 4 chronic kidney disease, or unspecified chronic kidney disease: Secondary | ICD-10-CM

## 2023-05-15 NOTE — Telephone Encounter (Signed)
Requested Prescriptions  Pending Prescriptions Disp Refills   levothyroxine (SYNTHROID) 112 MCG tablet [Pharmacy Med Name: LEVOTHYROXINE SODIUM 112 MCG TAB] 90 tablet 1    Sig: TAKE 1 TABLET BY MOUTH ONCE DAILY ON AN EMPTY STOMACH. WAIT 30 MINUTES BEFORE TAKING OTHER MEDS.     Endocrinology:  Hypothyroid Agents Passed - 05/14/2023  4:35 PM      Passed - TSH in normal range and within 360 days    TSH  Date Value Ref Range Status  04/21/2023 1.54 0.40 - 4.50 mIU/L Final         Passed - Valid encounter within last 12 months    Recent Outpatient Visits           2 weeks ago Benign hypertension with CKD (chronic kidney disease) stage III [facility])   [place] [facility] [place], Netta Neat, DO   10 months ago Annual physical exam   [place] [facility] Smitty Cords, DO   1 year ago RLS (restless legs syndrome)   [place] [facility] Smitty Cords, DO   1 year ago Benign hypertension with CKD (chronic kidney disease) stage III [facility])   [place] [facility] [place], Netta Neat, DO   2 years ago Benign hypertension with CKD (chronic kidney disease) stage III [facility])   [place] [facility] [place], Netta Neat, DO       Future Appointments             In 5 days Althea Charon, Netta Neat, DO [place] [facility], [facility]   In 3 weeks MacDiarmid, Lorin Picket, MD [facility] [place]             metoprolol succinate (TOPROL-XL) 100 MG 24 hr tablet [Pharmacy Med Name: METOPROLOL SUCCINATE ER 100 MG TAB] 90 tablet 3    Sig: TAKE 1 TABLET BY MOUTH ONCE DAILY WITH FOOD     Cardiovascular:  Beta Blockers Passed - 05/14/2023  4:35 PM      Passed - Last BP in normal range    BP Readings from Last 1 Encounters:  04/29/23 130/72         Passed - Last Heart Rate in normal range    Pulse Readings from Last 1 Encounters:  04/29/23 (!) 55          Passed - Valid encounter within last 6 months    Recent Outpatient Visits           2 weeks ago Benign hypertension with CKD (chronic kidney disease) stage III [facility])   [place] [facility] [place], Netta Neat, DO   10 months ago Annual physical exam   [place] [facility] Smitty Cords, DO   1 year ago RLS (restless legs syndrome)   [place] [facility] Smitty Cords, DO   1 year ago Benign hypertension with CKD (chronic kidney disease) stage III [facility])   [place] [facility] [employer], Netta Neat, DO   2 years ago Benign hypertension with CKD (chronic kidney disease) stage III [facility])   [place] [facility] [place], Netta Neat, DO       Future Appointments             In 5 days Althea Charon, Netta Neat, DO  [facility], [facility]   In 3 weeks Alfredo Martinez, MD [facility]  Health [facility]

## 2023-05-16 DIAGNOSIS — M159 Polyosteoarthritis, unspecified: Secondary | ICD-10-CM | POA: Diagnosis not present

## 2023-05-16 DIAGNOSIS — G2581 Restless legs syndrome: Secondary | ICD-10-CM | POA: Diagnosis not present

## 2023-05-16 DIAGNOSIS — N183 Chronic kidney disease, stage 3 unspecified: Secondary | ICD-10-CM | POA: Diagnosis not present

## 2023-05-16 DIAGNOSIS — I129 Hypertensive chronic kidney disease with stage 1 through stage 4 chronic kidney disease, or unspecified chronic kidney disease: Secondary | ICD-10-CM | POA: Diagnosis not present

## 2023-05-16 DIAGNOSIS — D631 Anemia in chronic kidney disease: Secondary | ICD-10-CM | POA: Diagnosis not present

## 2023-05-16 DIAGNOSIS — E034 Atrophy of thyroid (acquired): Secondary | ICD-10-CM | POA: Diagnosis not present

## 2023-05-20 ENCOUNTER — Ambulatory Visit: Payer: Medicare Other | Admitting: Family Medicine

## 2023-05-20 DIAGNOSIS — E034 Atrophy of thyroid (acquired): Secondary | ICD-10-CM | POA: Diagnosis not present

## 2023-05-20 DIAGNOSIS — M159 Polyosteoarthritis, unspecified: Secondary | ICD-10-CM | POA: Diagnosis not present

## 2023-05-20 DIAGNOSIS — I129 Hypertensive chronic kidney disease with stage 1 through stage 4 chronic kidney disease, or unspecified chronic kidney disease: Secondary | ICD-10-CM | POA: Diagnosis not present

## 2023-05-20 DIAGNOSIS — N183 Chronic kidney disease, stage 3 unspecified: Secondary | ICD-10-CM | POA: Diagnosis not present

## 2023-05-20 DIAGNOSIS — D631 Anemia in chronic kidney disease: Secondary | ICD-10-CM | POA: Diagnosis not present

## 2023-05-20 DIAGNOSIS — G2581 Restless legs syndrome: Secondary | ICD-10-CM | POA: Diagnosis not present

## 2023-05-23 DIAGNOSIS — E034 Atrophy of thyroid (acquired): Secondary | ICD-10-CM | POA: Diagnosis not present

## 2023-05-23 DIAGNOSIS — G2581 Restless legs syndrome: Secondary | ICD-10-CM | POA: Diagnosis not present

## 2023-05-23 DIAGNOSIS — M159 Polyosteoarthritis, unspecified: Secondary | ICD-10-CM | POA: Diagnosis not present

## 2023-05-23 DIAGNOSIS — N183 Chronic kidney disease, stage 3 unspecified: Secondary | ICD-10-CM | POA: Diagnosis not present

## 2023-05-23 DIAGNOSIS — D631 Anemia in chronic kidney disease: Secondary | ICD-10-CM | POA: Diagnosis not present

## 2023-05-23 DIAGNOSIS — I129 Hypertensive chronic kidney disease with stage 1 through stage 4 chronic kidney disease, or unspecified chronic kidney disease: Secondary | ICD-10-CM | POA: Diagnosis not present

## 2023-05-26 DIAGNOSIS — E034 Atrophy of thyroid (acquired): Secondary | ICD-10-CM | POA: Diagnosis not present

## 2023-05-26 DIAGNOSIS — G2581 Restless legs syndrome: Secondary | ICD-10-CM | POA: Diagnosis not present

## 2023-05-26 DIAGNOSIS — I129 Hypertensive chronic kidney disease with stage 1 through stage 4 chronic kidney disease, or unspecified chronic kidney disease: Secondary | ICD-10-CM | POA: Diagnosis not present

## 2023-05-26 DIAGNOSIS — D631 Anemia in chronic kidney disease: Secondary | ICD-10-CM | POA: Diagnosis not present

## 2023-05-26 DIAGNOSIS — N183 Chronic kidney disease, stage 3 unspecified: Secondary | ICD-10-CM | POA: Diagnosis not present

## 2023-05-26 DIAGNOSIS — M159 Polyosteoarthritis, unspecified: Secondary | ICD-10-CM | POA: Diagnosis not present

## 2023-05-28 DIAGNOSIS — G2581 Restless legs syndrome: Secondary | ICD-10-CM | POA: Diagnosis not present

## 2023-05-28 DIAGNOSIS — N183 Chronic kidney disease, stage 3 unspecified: Secondary | ICD-10-CM | POA: Diagnosis not present

## 2023-05-28 DIAGNOSIS — E034 Atrophy of thyroid (acquired): Secondary | ICD-10-CM | POA: Diagnosis not present

## 2023-05-28 DIAGNOSIS — D631 Anemia in chronic kidney disease: Secondary | ICD-10-CM | POA: Diagnosis not present

## 2023-05-28 DIAGNOSIS — M159 Polyosteoarthritis, unspecified: Secondary | ICD-10-CM | POA: Diagnosis not present

## 2023-05-28 DIAGNOSIS — I129 Hypertensive chronic kidney disease with stage 1 through stage 4 chronic kidney disease, or unspecified chronic kidney disease: Secondary | ICD-10-CM | POA: Diagnosis not present

## 2023-06-01 DIAGNOSIS — D631 Anemia in chronic kidney disease: Secondary | ICD-10-CM | POA: Diagnosis not present

## 2023-06-01 DIAGNOSIS — N183 Chronic kidney disease, stage 3 unspecified: Secondary | ICD-10-CM | POA: Diagnosis not present

## 2023-06-01 DIAGNOSIS — Z87440 Personal history of urinary (tract) infections: Secondary | ICD-10-CM | POA: Diagnosis not present

## 2023-06-01 DIAGNOSIS — H9193 Unspecified hearing loss, bilateral: Secondary | ICD-10-CM | POA: Diagnosis not present

## 2023-06-01 DIAGNOSIS — M62569 Muscle wasting and atrophy, not elsewhere classified, unspecified lower leg: Secondary | ICD-10-CM | POA: Diagnosis not present

## 2023-06-01 DIAGNOSIS — G894 Chronic pain syndrome: Secondary | ICD-10-CM | POA: Diagnosis not present

## 2023-06-01 DIAGNOSIS — E034 Atrophy of thyroid (acquired): Secondary | ICD-10-CM | POA: Diagnosis not present

## 2023-06-01 DIAGNOSIS — I129 Hypertensive chronic kidney disease with stage 1 through stage 4 chronic kidney disease, or unspecified chronic kidney disease: Secondary | ICD-10-CM | POA: Diagnosis not present

## 2023-06-01 DIAGNOSIS — G250 Essential tremor: Secondary | ICD-10-CM | POA: Diagnosis not present

## 2023-06-01 DIAGNOSIS — Z556 Problems related to health literacy: Secondary | ICD-10-CM | POA: Diagnosis not present

## 2023-06-01 DIAGNOSIS — F419 Anxiety disorder, unspecified: Secondary | ICD-10-CM | POA: Diagnosis not present

## 2023-06-01 DIAGNOSIS — G2581 Restless legs syndrome: Secondary | ICD-10-CM | POA: Diagnosis not present

## 2023-06-01 DIAGNOSIS — Z79891 Long term (current) use of opiate analgesic: Secondary | ICD-10-CM | POA: Diagnosis not present

## 2023-06-01 DIAGNOSIS — R296 Repeated falls: Secondary | ICD-10-CM | POA: Diagnosis not present

## 2023-06-01 DIAGNOSIS — M159 Polyosteoarthritis, unspecified: Secondary | ICD-10-CM | POA: Diagnosis not present

## 2023-06-01 DIAGNOSIS — Z9181 History of falling: Secondary | ICD-10-CM | POA: Diagnosis not present

## 2023-06-02 DIAGNOSIS — D631 Anemia in chronic kidney disease: Secondary | ICD-10-CM | POA: Diagnosis not present

## 2023-06-02 DIAGNOSIS — I129 Hypertensive chronic kidney disease with stage 1 through stage 4 chronic kidney disease, or unspecified chronic kidney disease: Secondary | ICD-10-CM | POA: Diagnosis not present

## 2023-06-02 DIAGNOSIS — M159 Polyosteoarthritis, unspecified: Secondary | ICD-10-CM | POA: Diagnosis not present

## 2023-06-02 DIAGNOSIS — N183 Chronic kidney disease, stage 3 unspecified: Secondary | ICD-10-CM | POA: Diagnosis not present

## 2023-06-02 DIAGNOSIS — E034 Atrophy of thyroid (acquired): Secondary | ICD-10-CM | POA: Diagnosis not present

## 2023-06-02 DIAGNOSIS — G2581 Restless legs syndrome: Secondary | ICD-10-CM | POA: Diagnosis not present

## 2023-06-06 ENCOUNTER — Other Ambulatory Visit: Payer: Self-pay

## 2023-06-06 MED ORDER — TAMSULOSIN HCL 0.4 MG PO CAPS: 0.8000 mg | ORAL_CAPSULE | Freq: Every day | ORAL | 0 refills | Status: DC

## 2023-06-09 ENCOUNTER — Encounter: Payer: Self-pay | Admitting: Urology

## 2023-06-09 ENCOUNTER — Ambulatory Visit (INDEPENDENT_AMBULATORY_CARE_PROVIDER_SITE_OTHER): Payer: Medicare Other | Admitting: Urology

## 2023-06-09 VITALS — BP 169/76 | HR 60

## 2023-06-09 DIAGNOSIS — R339 Retention of urine, unspecified: Secondary | ICD-10-CM | POA: Diagnosis not present

## 2023-06-09 LAB — URINALYSIS, COMPLETE
Bilirubin, UA: NEGATIVE
Glucose, UA: NEGATIVE
Ketones, UA: NEGATIVE
Leukocytes,UA: NEGATIVE
Nitrite, UA: NEGATIVE
Protein,UA: NEGATIVE
RBC, UA: NEGATIVE
Specific Gravity, UA: 1.015 (ref 1.005–1.030)
Urobilinogen, Ur: 0.2 mg/dL (ref 0.2–1.0)
pH, UA: 7 (ref 5.0–7.5)

## 2023-06-09 LAB — MICROSCOPIC EXAMINATION: Bacteria, UA: NONE SEEN

## 2023-06-09 MED ORDER — TAMSULOSIN HCL 0.4 MG PO CAPS: 0.8000 mg | ORAL_CAPSULE | Freq: Every day | ORAL | 0 refills | Status: DC

## 2023-06-09 NOTE — Progress Notes (Signed)
06/09/2023 9:48 AM   Brittney Simpson 09-Aug-1927 [id number]  Referring provider: Smitty Cords, DO [address] [place],  [place] [address]  Chief Complaint  Patient presents with   Follow-up    HPI: Consulted to assess the patient for possible bladder infection and incontinence.  History was limited and patient's daughter was helpful.  Patient is hard of hearing.  It appeared she was wearing 2 depends a day in the last 2 weeks she is not leaking but has trouble to go.  She will feel an urge and then she cannot go.  She voids 4-5 times a day.  She will then double void and strain.  She says she is not getting up at night to urinate   Patient likely having obstructive voiding secondary to overactive bladder.  Her referring doctor wanted to place her on mirabegron which I think is a good choice but apparently it was expensive.  Because of the obstructive component I decided to give her Flomax and perform cystoscopy in the next 3 weeks and proceed accordingly.   Last culture negative Patient clinically not infected today with little bit of burning yesterday.  Urine sent for culture.  Urine looks good She did not want the cystoscopy because the Flomax has dramatically improved her symptoms.  Flow much better and she feels empty   Today Frequency improved with Flomax.  Flow improved.  Little bit of burning today.  Flomax renewed   Today Patient was having trouble to void and family doctor increase it to 0.8 mg of Flomax he thought it worked better.  She has some incontinence but overall is doing well and likes the higher dose.  Flomax 60 tablets and 0.8 mg a day with 11 refills given. Watchful waiting for incontinence. She was having a lot of flow symptoms a year ago. I will see her in a year   Today Flow is stable.  Mild incontinence stable.  No infections.  Again watchful waiting for incontinence.   PMH: Past Medical History:  Diagnosis Date   Anxiety    Arthritis    Back  ache    Hiatal hernia    Hyperlipidemia    Hypertension    Hypothyroid    Osteopenia    Thyroid disease    UTI (urinary tract infection)     Surgical History: Past Surgical History:  Procedure Laterality Date   CHOLECYSTECTOMY     HEMORRHOID SURGERY     HERNIA REPAIR     HERNIA REPAIR     KYPHOPLASTY N/A 01/23/2017   Procedure: [id number];  Surgeon: Kennedy Bucker, MD;  Location: [facility] ORS;  Service: Orthopedics;  Laterality: N/A;   KYPHOPLASTY N/A 01/25/2020   Procedure: L4 KYPHOPLASTY;  Surgeon: Kennedy Bucker, MD;  Location: [facility] OR[facility];  Service: Orthopedics;  Laterality: N/A;    Home Medications:  Allergies as of 06/09/2023   No Known Allergies      Medication List        Accurate as of June 09, 2023  9:48 AM. If you have any questions, ask your nurse or doctor.          acetaminophen 500 MG tablet Commonly known as: TYLENOL Take 1,000 mg by mouth daily as needed for moderate pain or headache.   amLODipine 5 MG tablet Commonly known as: NORVASC TAKE 1 TABLET BY MOUTH ONCE DAILY WITH LUNCH   brimonidine 0.2 % ophthalmic solution Commonly known as: ALPHAGAN Place 1 drop into the left eye 2 (two)  times daily.   clonazePAM 0.5 MG tablet Commonly known as: KLONOPIN TAKE 1/2 TABLET BY MOUTH TWICE DAILY   Cranberry 500 MG Caps Take 1,000 mg by mouth daily with supper.   furosemide 20 MG tablet Commonly known as: LASIX TAKE 1 and 1/2 TABLETS BY MOUTH ONCE DAILY   latanoprost 0.005 % ophthalmic solution Commonly known as: XALATAN Place 1 drop into both eyes at bedtime.   levothyroxine 112 MCG tablet Commonly known as: SYNTHROID TAKE 1 TABLET BY MOUTH ONCE DAILY ON AN EMPTY STOMACH. WAIT 30 MINUTES BEFORE TAKING OTHER MEDS.   lisinopril 40 MG tablet Commonly known as: ZESTRIL TAKE 1 TABLET BY MOUTH ONCE DAILY   Melatonin 10 MG Tabs Take 10 mg by mouth at bedtime.   metoprolol succinate 100 MG 24 hr tablet Commonly known as: TOPROL-XL Take 1  tablet (100 mg total) by mouth daily. with food   omeprazole 20 MG capsule Commonly known as: PRILOSEC TAKE 1 CAPSULE BY MOUTH ONCE DAILY BEFORE BREAKFAST   tamsulosin 0.4 MG Caps capsule Commonly known as: FLOMAX Take 2 capsules (0.8 mg total) by mouth daily.   traMADol 50 MG tablet Commonly known as: ULTRAM Take 1 tablet (50 mg total) by mouth 3 (three) times daily. Each refill must last 30 days.   traZODone 50 MG tablet Commonly known as: DESYREL TAKE 1 TABLET BY MOUTH AT BEDTIME   Vitamin D3 50 MCG (2000 UT) Tabs Take 2,000 Units by mouth daily with lunch.        Allergies: No Known Allergies  Family History: Family History  Problem Relation Age of Onset   Stroke Mother    Cancer Father     Social History:  reports that she has never smoked. She has never used smokeless tobacco. She reports that she does not drink alcohol and does not use drugs.  ROS:                                        Physical Exam: There were no vitals taken for this visit.  Constitutional:  Alert and oriented, No acute distress. HEENT: [place] AT, moist mucus membranes.  Trachea midline, no masses.   Laboratory Data: Lab Results  Component Value Date   WBC 5.5 04/21/2023   HGB 11.5 (L) 04/21/2023   HCT 39.0 04/21/2023   MCV 80.4 04/21/2023   PLT 233 04/21/2023    Lab Results  Component Value Date   CREATININE 0.79 04/21/2023    No results found for: "PSA"  No results found for: "TESTOSTERONE"  Lab Results  Component Value Date   HGBA1C 5.8 (H) 04/21/2023    Urinalysis    Component Value Date/Time   COLORURINE YELLOW (A) 03/20/2021 1536   APPEARANCEUR Clear 04/30/2021 0945   LABSPEC 1.011 03/20/2021 1536   LABSPEC 1.003 07/08/2014 0907   PHURINE 6.0 03/20/2021 1536   GLUCOSEU Negative 04/30/2021 0945   GLUCOSEU Negative 07/08/2014 0907   HGBUR NEGATIVE 03/20/2021 1536   BILIRUBINUR Negative 04/30/2021 0945   BILIRUBINUR Negative 07/08/2014  0907   KETONESUR NEGATIVE 03/20/2021 1536   PROTEINUR Negative 04/30/2021 0945   PROTEINUR NEGATIVE 03/20/2021 1536   NITRITE Negative 04/30/2021 0945   NITRITE NEGATIVE 03/20/2021 1536   LEUKOCYTESUR Negative 04/30/2021 0945   LEUKOCYTESUR NEGATIVE 03/20/2021 1536   LEUKOCYTESUR Negative 07/08/2014 0907    Pertinent Imaging:   Assessment & Plan: Flomax 0.8 mg 30  days x 11 sent to pharmacy and I will see in a year  1. Incomplete bladder emptying  - Urinalysis, Complete   No follow-ups on file.  Martina Sinner, MD  [facility] [address] [place], [place] [address] [phone]

## 2023-06-11 ENCOUNTER — Other Ambulatory Visit: Payer: Self-pay | Admitting: Family Medicine

## 2023-06-11 DIAGNOSIS — N183 Chronic kidney disease, stage 3 unspecified: Secondary | ICD-10-CM

## 2023-06-11 DIAGNOSIS — E034 Atrophy of thyroid (acquired): Secondary | ICD-10-CM

## 2023-06-11 DIAGNOSIS — F419 Anxiety disorder, unspecified: Secondary | ICD-10-CM

## 2023-06-12 NOTE — Telephone Encounter (Signed)
Requested medication (s) are due for refill today: no  Requested medication (s) are on the active medication list: yes    Last refill: 04/15/23  #30  2 refills  Future visit scheduled No  Notes to clinic:Cannot refuse non-delegated meds. Please review. Thank you  Requested Prescriptions  Pending Prescriptions Disp Refills   clonazePAM (KLONOPIN) 0.5 MG tablet [Pharmacy Med Name: CLONAZEPAM 0.5 MG TAB] 30 tablet     Sig: TAKE 1/2 TABLET BY MOUTH TWICE DAILY     Not Delegated - Psychiatry: Anxiolytics/Hypnotics 2 Failed - 06/11/2023 10:32 AM      Failed - This refill cannot be delegated      Failed - Urine Drug Screen completed in last 360 days      Passed - Patient is not pregnant      Passed - Valid encounter within last 6 months    Recent Outpatient Visits           1 month ago Benign hypertension with CKD (chronic kidney disease) stage III [facility])   [place] [facility] Smitty Cords, DO   11 months ago Annual physical exam   [place] [facility] Smitty Cords, DO   1 year ago RLS (restless legs syndrome)   [place] [facility] Smitty Cords, DO   1 year ago Benign hypertension with CKD (chronic kidney disease) stage III [facility])   [place] [facility] [place], Netta Neat, DO   2 years ago Benign hypertension with CKD (chronic kidney disease) stage III [facility]   [place] [facility] Smitty Cords, DO       Future Appointments             In 11 months MacDiarmid, Lorin Picket, MD [facility] [place]            Signed Prescriptions Disp Refills   metoprolol succinate (TOPROL-XL) 100 MG 24 hr tablet 90 tablet 0    Sig: TAKE 1 TABLET BY MOUTH ONCE DAILY WITH FOOD     Cardiovascular:  Beta Blockers Failed - 06/11/2023 10:32 AM      Failed - Last BP in normal range    BP Readings from Last 1 Encounters:  06/09/23 (!) 169/76          Passed - Last Heart Rate in normal range    Pulse Readings from Last 1 Encounters:  06/09/23 60         Passed - Valid encounter within last 6 months    Recent Outpatient Visits           1 month ago Benign hypertension with CKD (chronic kidney disease) stage III [facility])   [place] [facility] [place], Netta Neat, DO   11 months ago Annual physical exam   [place] [facility] Smitty Cords, DO   1 year ago RLS (restless legs syndrome)   [place] [facility] Smitty Cords, DO   1 year ago Benign hypertension with CKD (chronic kidney disease) stage III [facility])   [place] [facility], Netta Neat, DO   2 years ago Benign hypertension with CKD (chronic kidney disease) stage III [facility])    [facility] Smitty Cords, DO       Future Appointments             In 11 months MacDiarmid, Lorin Picket,  MD [facility] [place]             levothyroxine (SYNTHROID) 112 MCG tablet 90 tablet 1    Sig: TAKE 1 TABLET BY MOUTH ONCE DAILY ON AN EMPTY STOMACH. WAIT 30 MINUTES BEFORE TAKING OTHER MEDS.     Endocrinology:  Hypothyroid Agents Passed - 06/11/2023 10:32 AM      Passed - TSH in normal range and within 360 days    TSH  Date Value Ref Range Status  04/21/2023 1.54 0.40 - 4.50 mIU/L Final         Passed - Valid encounter within last 12 months    Recent Outpatient Visits           1 month ago Benign hypertension with CKD (chronic kidney disease) stage III [facility])   [place] [facility] Smitty Cords, DO   11 months ago Annual physical exam   [place] [facility] Smitty Cords, DO   1 year ago RLS (restless legs syndrome)   [place] [facility] Smitty Cords, DO   1 year ago Benign hypertension with CKD (chronic kidney disease) stage  III [facility])   [place] [facility] [place], Netta Neat, DO   2 years ago Benign hypertension with CKD (chronic kidney disease) stage III [facility])   [place] [facility] Smitty Cords, DO       Future Appointments             In 11 months MacDiarmid, Lorin Picket, MD [facility]

## 2023-06-12 NOTE — Telephone Encounter (Signed)
Requested Prescriptions  Pending Prescriptions Disp Refills   clonazePAM (KLONOPIN) 0.5 MG tablet [Pharmacy Med Name: CLONAZEPAM 0.5 MG TAB] 30 tablet     Sig: TAKE 1/2 TABLET BY MOUTH TWICE DAILY     Not Delegated - Psychiatry: Anxiolytics/Hypnotics 2 Failed - 06/11/2023 10:32 AM      Failed - This refill cannot be delegated      Failed - Urine Drug Screen completed in last 360 days      Passed - Patient is not pregnant      Passed - Valid encounter within last 6 months    Recent Outpatient Visits           1 month ago Benign hypertension with CKD (chronic kidney disease) stage III [facility])   [place] [facility] Smitty Cords, DO   11 months ago Annual physical exam   [place] [facility] Smitty Cords, DO   1 year ago RLS (restless legs syndrome)   [place] [facility] Smitty Cords, DO   1 year ago Benign hypertension with CKD (chronic kidney disease) stage III [facility])   [place] [facility] [place], Netta Neat, DO   2 years ago Benign hypertension with CKD (chronic kidney disease) stage III [facility])   [place] [facility] Smitty Cords, DO       Future Appointments             In 11 months MacDiarmid, Lorin Picket, MD [facility] [place]             metoprolol succinate (TOPROL-XL) 100 MG 24 hr tablet [Pharmacy Med Name: METOPROLOL SUCCINATE ER 100 MG TAB] 90 tablet 3    Sig: TAKE 1 TABLET BY MOUTH ONCE DAILY WITH FOOD     Cardiovascular:  Beta Blockers Failed - 06/11/2023 10:32 AM      Failed - Last BP in normal range    BP Readings from Last 1 Encounters:  06/09/23 (!) 169/76         Passed - Last Heart Rate in normal range    Pulse Readings from Last 1 Encounters:  06/09/23 60         Passed - Valid encounter within last 6 months    Recent Outpatient Visits           1 month ago Benign hypertension with CKD  (chronic kidney disease) stage III [facility])   [place] [facility], Netta Neat, DO   11 months ago Annual physical exam   [place] [facility] Smitty Cords, DO   1 year ago RLS (restless legs syndrome)   [place] [facility] Smitty Cords, DO   1 year ago Benign hypertension with CKD (chronic kidney disease) stage III [facility])   [place] [facility] [place], Netta Neat, DO   2 years ago Benign hypertension with CKD (chronic kidney disease) stage III [facility])    [facility] Smitty Cords, DO       Future Appointments             In 11 months MacDiarmid, Lorin Picket, MD [facility] [place]             levothyroxine (SYNTHROID) 112 MCG tablet [Pharmacy Med Name: LEVOTHYROXINE SODIUM 112 MCG TAB] 90 tablet 1    Sig: TAKE 1 TABLET BY MOUTH ONCE DAILY  ON AN EMPTY STOMACH. WAIT 30 MINUTES BEFORE TAKING OTHER MEDS.     Endocrinology:  Hypothyroid Agents Passed - 06/11/2023 10:32 AM      Passed - TSH in normal range and within 360 days    TSH  Date Value Ref Range Status  04/21/2023 1.54 0.40 - 4.50 mIU/L Final         Passed - Valid encounter within last 12 months    Recent Outpatient Visits           1 month ago Benign hypertension with CKD (chronic kidney disease) stage III [facility])   [place] [facility] Smitty Cords, DO   11 months ago Annual physical exam   [place] [facility] Smitty Cords, DO   1 year ago RLS (restless legs syndrome)   [place] [facility] Smitty Cords, DO   1 year ago Benign hypertension with CKD (chronic kidney disease) stage III [facility])   [place] [facility] [place], Netta Neat, DO   2 years ago Benign hypertension with CKD (chronic kidney disease) stage III [facility])    [facility] Smit[facility] Cords, DO       Future Appointments             In 11 months MacDiarmid, Lorin Picket, MD [facility]

## 2023-06-14 DIAGNOSIS — M159 Polyosteoarthritis, unspecified: Secondary | ICD-10-CM | POA: Diagnosis not present

## 2023-06-14 DIAGNOSIS — D631 Anemia in chronic kidney disease: Secondary | ICD-10-CM | POA: Diagnosis not present

## 2023-06-14 DIAGNOSIS — E034 Atrophy of thyroid (acquired): Secondary | ICD-10-CM | POA: Diagnosis not present

## 2023-06-14 DIAGNOSIS — G2581 Restless legs syndrome: Secondary | ICD-10-CM | POA: Diagnosis not present

## 2023-06-14 DIAGNOSIS — N183 Chronic kidney disease, stage 3 unspecified: Secondary | ICD-10-CM | POA: Diagnosis not present

## 2023-06-14 DIAGNOSIS — I129 Hypertensive chronic kidney disease with stage 1 through stage 4 chronic kidney disease, or unspecified chronic kidney disease: Secondary | ICD-10-CM | POA: Diagnosis not present

## 2023-06-16 DIAGNOSIS — E034 Atrophy of thyroid (acquired): Secondary | ICD-10-CM | POA: Diagnosis not present

## 2023-06-16 DIAGNOSIS — D631 Anemia in chronic kidney disease: Secondary | ICD-10-CM | POA: Diagnosis not present

## 2023-06-16 DIAGNOSIS — M159 Polyosteoarthritis, unspecified: Secondary | ICD-10-CM | POA: Diagnosis not present

## 2023-06-16 DIAGNOSIS — G2581 Restless legs syndrome: Secondary | ICD-10-CM | POA: Diagnosis not present

## 2023-06-16 DIAGNOSIS — I129 Hypertensive chronic kidney disease with stage 1 through stage 4 chronic kidney disease, or unspecified chronic kidney disease: Secondary | ICD-10-CM | POA: Diagnosis not present

## 2023-06-16 DIAGNOSIS — N183 Chronic kidney disease, stage 3 unspecified: Secondary | ICD-10-CM | POA: Diagnosis not present

## 2023-06-23 DIAGNOSIS — I129 Hypertensive chronic kidney disease with stage 1 through stage 4 chronic kidney disease, or unspecified chronic kidney disease: Secondary | ICD-10-CM | POA: Diagnosis not present

## 2023-06-23 DIAGNOSIS — G2581 Restless legs syndrome: Secondary | ICD-10-CM | POA: Diagnosis not present

## 2023-06-23 DIAGNOSIS — N183 Chronic kidney disease, stage 3 unspecified: Secondary | ICD-10-CM | POA: Diagnosis not present

## 2023-06-23 DIAGNOSIS — E034 Atrophy of thyroid (acquired): Secondary | ICD-10-CM | POA: Diagnosis not present

## 2023-06-23 DIAGNOSIS — D631 Anemia in chronic kidney disease: Secondary | ICD-10-CM | POA: Diagnosis not present

## 2023-06-23 DIAGNOSIS — M159 Polyosteoarthritis, unspecified: Secondary | ICD-10-CM | POA: Diagnosis not present

## 2023-06-30 NOTE — Progress Notes (Unsigned)
PROVIDER NOTE: Interpretation of information contained herein should be left to medically-trained personnel. Specific patient instructions are provided elsewhere under "Patient Instructions" section of medical record. This document was created in part using STT-dictation technology, any transcriptional errors that may result from this process are unintentional.  Patient: Brittney Simpson Type: Established DOB: 16-Nov-1927 MRN: [id number] PCP: Smitty Cords, DO  Service: Procedure DOS: 07/01/2023 Setting: Ambulatory Location: Ambulatory outpatient facility Delivery: Face-to-face Provider: Oswaldo Done, MD Specialty: Interventional Pain Management Specialty designation: 09 Location: Outpatient facility Ref. Prov.: Saralyn Pilar *       Interventional Therapy   Type: Lumbar epidural steroid injection (LESI) (interlaminar)  #10 (since 08/22/2015) Laterality: Right   Level:  L1-2 Level.  Imaging: Fluoroscopic guidance Spinal ([id number]) Anesthesia: Local anesthesia (1-2% Lidocaine) Anxiolysis: None                 Sedation: No Sedation                       DOS: 07/01/2023  Performed by: Oswaldo Done, MD  Purpose: Diagnostic/Therapeutic Indications: Lumbar radicular pain of intraspinal etiology of more than 4 weeks that has failed to respond to conservative therapy and is severe enough to impact quality of life or function. 1. Chronic low back pain (Right) w/ radicular pain (Right)   2. Chronic lumbar radicular pain (Right)   3. Degeneration of intervertebral disc of lumbosacral region with lower extremity pain   4. T12 compression fracture, sequela   5. Spondylolisthesis of lumbosacral region (L2-3 and L5-S1)   6. Spinal stenosis of lumbar region with neurogenic claudication   7. Lumbar lateral recess stenosis   8. Lumbar foraminal stenosis   9. Abnormal MRI, lumbar spine (02/02/2020)   10. Bilateral hearing loss, unspecified hearing loss type     NAS-11 Pain score:   Pre-procedure: 8 /10   Post-procedure: 0-No pain/10      Position / Prep / Materials:  Position: Prone w/ head of the table raised (slight reverse trendelenburg) to facilitate breathing.  Prep solution: ChloraPrep (2% chlorhexidine gluconate and 70% isopropyl alcohol) Prep Area: Entire Posterior Lumbar Region from lower scapular tip down to mid buttocks area and from flank to flank. Materials:  Tray: Epidural tray Needle(s):  Type: Epidural needle (Tuohy) Gauge (G):  17 Length: Regular (3.5-in) Qty: 1   H&P (Pre-op Assessment):  Brittney Simpson is a 89 y.o. (year old), female patient, seen today for interventional treatment. She  has a past surgical history that includes Hernia repair; Hernia repair; Hemorrhoid surgery; Kyphoplasty (N/A, 01/23/2017); Kyphoplasty (N/A, 01/25/2020); and Cholecystectomy. Brittney Simpson has a current medication list which includes the following prescription(s): acetaminophen, amlodipine, brimonidine, vitamin d3, clonazepam, cranberry, furosemide, latanoprost, levothyroxine, lisinopril, melatonin, metoprolol succinate, omeprazole, tamsulosin, tramadol, and trazodone. Her primarily concern today is the Back Pain  Initial Vital Signs:  Pulse/HCG Rate: 63ECG Heart Rate: (!) 58 Temp: (!) 97.3 F (36.3 C) Resp: 16 BP: (!) 152/84 SpO2: 99 %  BMI: Estimated body mass index is 20.19 kg/m as calculated from the following:   Height as of this encounter: 5\' 3"  (1.6 m).   Weight as of this encounter: 114 lb (51.7 kg).  Risk Assessment: Allergies: Reviewed. She has No Known Allergies.  Allergy Precautions: None required Coagulopathies: Reviewed. None identified.  Blood-thinner therapy: None at this time Active Infection(s): Reviewed. None identified. Brittney Simpson is afebrile  Site Confirmation: Brittney Simpson was asked to confirm the procedure and laterality before marking  the site Procedure checklist: Completed Consent: Before the  procedure and under the influence of no sedative(s), amnesic(s), or anxiolytics, the patient was informed of the treatment options, risks and possible complications. To fulfill our ethical and legal obligations, as recommended by the American Medical Association's Code of Ethics, I have informed the patient of my clinical impression; the nature and purpose of the treatment or procedure; the risks, benefits, and possible complications of the intervention; the alternatives, including doing nothing; the risk(s) and benefit(s) of the alternative treatment(s) or procedure(s); and the risk(s) and benefit(s) of doing nothing. The patient was provided information about the general risks and possible complications associated with the procedure. These may include, but are not limited to: failure to achieve desired goals, infection, bleeding, organ or nerve damage, allergic reactions, paralysis, and death. In addition, the patient was informed of those risks and complications associated to Spine-related procedures, such as failure to decrease pain; infection (i.e.: Meningitis, epidural or intraspinal abscess); bleeding (i.e.: epidural hematoma, subarachnoid hemorrhage, or any other type of intraspinal or peri-dural bleeding); organ or nerve damage (i.e.: Any type of peripheral nerve, nerve root, or spinal cord injury) with subsequent damage to sensory, motor, and/or autonomic systems, resulting in permanent pain, numbness, and/or weakness of one or several areas of the body; allergic reactions; (i.e.: anaphylactic reaction); and/or death. Furthermore, the patient was informed of those risks and complications associated with the medications. These include, but are not limited to: allergic reactions (i.e.: anaphylactic or anaphylactoid reaction(s)); adrenal axis suppression; blood sugar elevation that in diabetics may result in ketoacidosis or comma; water retention that in patients with history of congestive heart failure  may result in shortness of breath, pulmonary edema, and decompensation with resultant heart failure; weight gain; swelling or edema; medication-induced neural toxicity; particulate matter embolism and blood vessel occlusion with resultant organ, and/or nervous system infarction; and/or aseptic necrosis of one or more joints. Finally, the patient was informed that Medicine is not an exact science; therefore, there is also the possibility of unforeseen or unpredictable risks and/or possible complications that may result in a catastrophic outcome. The patient indicated having understood very clearly. We have given the patient no guarantees and we have made no promises. Enough time was given to the patient to ask questions, all of which were answered to the patient's satisfaction. Ms. Rolfe has indicated that she wanted to continue with the procedure. Attestation: I, the ordering provider, attest that I have discussed with the patient the benefits, risks, side-effects, alternatives, likelihood of achieving goals, and potential problems during recovery for the procedure that I have provided informed consent. Date  Time: 07/01/2023 11:28 AM   Pre-Procedure Preparation:  Monitoring: As per clinic protocol. Respiration, ETCO2, SpO2, BP, heart rate and rhythm monitor placed and checked for adequate function Safety Precautions: Patient was assessed for positional comfort and pressure points before starting the procedure. Time-out: I initiated and conducted the "Time-out" before starting the procedure, as per protocol. The patient was asked to participate by confirming the accuracy of the "Time Out" information. Verification of the correct person, site, and procedure were performed and confirmed by me, the nursing staff, and the patient. "Time-out" conducted as per Joint Commission's Universal Protocol (UP.01.01.01). Time: 1211 Start Time: 1211 hrs.  Description/Narrative of Procedure:          Target:  Epidural space via interlaminar opening, initially targeting the lower laminar border of the superior vertebral body. Region: Lumbar Approach: Percutaneous paravertebral  Rationale (medical necessity): procedure needed  and proper for the diagnosis and/or treatment of the patient's medical symptoms and needs. Procedural Technique Safety Precautions: Aspiration looking for blood return was conducted prior to all injections. At no point did we inject any substances, as a needle was being advanced. No attempts were made at seeking any paresthesias. Safe injection practices and needle disposal techniques used. Medications properly checked for expiration dates. SDV (single dose vial) medications used. Description of the Procedure: Protocol guidelines were followed. The procedure needle was introduced through the skin, ipsilateral to the reported pain, and advanced to the target area. Bone was contacted and the needle walked caudad, until the lamina was cleared. The epidural space was identified using "loss-of-resistance technique" with 2-3 ml of PF-NaCl (0.9% NSS), in a 5cc LOR glass syringe.  Vitals:   07/01/23 1205 07/01/23 1210 07/01/23 1215 07/01/23 1219  BP: (!) 141/88 (!) 144/66 (!) 141/63 137/61  Pulse:      Resp: 13 12 12 11   Temp:      SpO2: 98% 98% 98% 98%  Weight:      Height:        Start Time: 1211 hrs. End Time: 1219 hrs.  Imaging Guidance (Spinal):          Type of Imaging Technique: Fluoroscopy Guidance (Spinal) Indication(s): Fluoroscopy guidance for needle placement to enhance accuracy in procedures requiring precise needle localization for targeted delivery of medication in or near specific anatomical locations not easily accessible without such real-time imaging assistance. Exposure Time: Please see nurses notes. Contrast: Before injecting any contrast, we confirmed that the patient did not have an allergy to iodine, shellfish, or radiological contrast. Once satisfactory  needle placement was completed at the desired level, radiological contrast was injected. Contrast injected under live fluoroscopy. No contrast complications. See chart for type and volume of contrast used. Fluoroscopic Guidance: I was personally present during the use of fluoroscopy. "Tunnel Vision Technique" used to obtain the best possible view of the target area. Parallax error corrected before commencing the procedure. "Direction-depth-direction" technique used to introduce the needle under continuous pulsed fluoroscopy. Once target was reached, antero-posterior, oblique, and lateral fluoroscopic projection used confirm needle placement in all planes. Images permanently stored in EMR. Interpretation: I personally interpreted the imaging intraoperatively. Adequate needle placement confirmed in multiple planes. Appropriate spread of contrast into desired area was observed. No evidence of afferent or efferent intravascular uptake. No intrathecal or subarachnoid spread observed. Permanent images saved into the patient's record.  Antibiotic Prophylaxis:   Anti-infectives (From admission, onward)    None      Indication(s): None identified  Post-operative Assessment:  Post-procedure Vital Signs:  Pulse/HCG Rate: 6366 Temp: (!) 97.3 F (36.3 C) Resp: 11 BP: 137/61 SpO2: 98 %  EBL: None  Complications: No immediate post-treatment complications observed by team, or reported by patient.  Note: The patient tolerated the entire procedure well. A repeat set of vitals were taken after the procedure and the patient was kept under observation following institutional policy, for this type of procedure. Post-procedural neurological assessment was performed, showing return to baseline, prior to discharge. The patient was provided with post-procedure discharge instructions, including a section on how to identify potential problems. Should any problems arise concerning this procedure, the patient was given  instructions to immediately contact us, at any time, without hesitation. In any case, we plan to contact the patient by telephone for a follow-up status report regarding this interventional procedure.  Comments:  No additional relevant information.  Plan of Care (POC)  Orders:  Orders Placed This Encounter  Procedures   Lumbar Epidural Injection    Scheduling Instructions:     Procedure: Interlaminar LESI T12-L1     Laterality: Right     Sedation: Patient's choice     Timeframe: Today    Order Specific Question:   Where will this procedure be performed?    Answer:   [facility]   DG PAIN CLIN[facility] C-ARM 1-60 MIN NO REPORT    Intraoperative interpretation by procedural physician at [facility].    Standing Status:   Standing    Number of Occurrences:   1    Order Specific Question:   Reason for exam:    Answer:   Assistance in needle guidance and placement for procedures requiring needle placement in or near specific anatomical locations not easily accessible without such assistance.   Informed Consent Details: Physician/Practitioner Attestation; Transcribe to consent form and obtain patient signature    Note: Always confirm laterality of pain with Ms. Vitiello, before procedure. Transcribe to consent form and obtain patient signature.    Order Specific Question:   Physician/Practitioner attestation of informed consent for procedure/surgical case    Answer:   I, the physician/practitioner, attest that I have discussed with the patient the benefits, risks, side effects, alternatives, likelihood of achieving goals and potential problems during recovery for the procedure that I have provided informed consent.    Order Specific Question:   Procedure    Answer:   Lumbar epidural steroid injection under fluoroscopic guidance    Order Specific Question:   Physician/Practitioner performing the procedure    Answer:   Sargon Scouten A. Laban Emperor, MD    Order Specific Question:    Indication/Reason    Answer:   Low back and/or lower extremity pain secondary to lumbar radiculitis   Provide equipment / supplies at bedside    Procedural tray: Epidural Tray (Disposable  single use) Skin infiltration needle: Regular 1.5-in, 25-G, (x1) Block needle size: Regular standard Catheter: No catheter required    Standing Status:   Standing    Number of Occurrences:   1    Order Specific Question:   Specify    Answer:   Epidural Tray   Chronic Opioid Analgesic:  Tramadol 50 mg, 1 tab PO q 8 hrs (150 mg/day of tramadol) MME/day: 15 mg/day.   Medications ordered for procedure: Meds ordered this encounter  Medications   iohexol (OMNIPAQUE) 180 MG/ML injection 10 mL    Must be Myelogram-compatible. If not available, you may substitute with a water-soluble, non-ionic, hypoallergenic, myelogram-compatible radiological contrast medium.   lidocaine (XYLOCAINE) 2 % (with pres) injection 400 mg   pentafluoroprop-tetrafluoroeth (GEBAUERS) aerosol   sodium chloride flush (NS) 0.9 % injection 2 mL   ropivacaine (PF) 2 mg/mL (0.2%) (NAROPIN) injection 2 mL   triamcinolone acetonide (KENALOG-40) injection 40 mg   Medications administered: We administered iohexol, lidocaine, pentafluoroprop-tetrafluoroeth, sodium chloride flush, ropivacaine (PF) 2 mg/mL (0.2%), and triamcinolone acetonide.  See the medical record for exact dosing, route, and time of administration.  Follow-up plan:   Return in about 2 weeks (around 07/15/2023) for (VV), (PPE).       Interventional Therapies  Risk  Complexity Considerations:   Advanced age  HOH  AAA  HTN  Stage3 CKD  GERD   Note: Hard of hearing   Planned  Pending:      Under consideration:   (PRN) palliative right L1-2 LESI #10 (07/01/2023)    Completed:   Palliative  right L1-2 LESI x9 (01/21/2023) (100/100/80/80)  Therapeutic right L2-3 LESI x1 (03/07/2020) (100/100/25/<50)  Diagnostic/therapeutic bilateral SI joint injection x1  (02/10/2020) (100/100/100 x2 days/0)  Palliative right lumbar facet MBB x13 (01/04/2020) (8 to 0) (100/100/100)  Palliative left lumbar facet MBB x11 (01/04/2020) (8 to 0) (100/100/100)    Therapeutic  Palliative (PRN) options:   Palliative right T12-L1 LESI (PRN)   Pharmacotherapy  Nonopioids transferred 04/11/2020: Robaxin       Recent Visits Date Type Provider Dept  04/07/23 Office Visit Delano Metz, MD [facility]  Showing recent visits within past 90 days and meeting all other requirements Today's Visits Date Type Provider Dept  07/01/23 Procedure visit Delano Metz, MD [facility]  Showing today's visits and meeting all other requirements Future Appointments Date Type Provider Dept  07/17/23 Appointment Delano Metz, MD [facility]  Showing future appointments within next 90 days and meeting all other requirements  Disposition: Discharge home  Discharge (Date  Time): 07/01/2023; 1224 hrs.   Primary Care Physician: Smitty Cords, DO Location: [facility] Note by: Oswaldo Done, MD (TTS technology used. I apologize for any typographical errors that were not detected and corrected.) Date: 07/01/2023; Time: 12:33 PM  Disclaimer:  Medicine is not an Visual merchandiser. The only guarantee in medicine is that nothing is guaranteed. It is important to note that the decision to proceed with this intervention was based on the information collected from the patient. The Data and conclusions were drawn from the patient's questionnaire, the interview, and the physical examination. Because the information was provided in large part by the patient, it cannot be guaranteed that it has not been purposely or unconsciously manipulated. Every effort has been made to obtain as much relevant data as possible for this evaluation. It is important to note that the conclusions that lead to this procedure are derived in  large part from the available data. Always take into account that the treatment will also be dependent on availability of resources and existing treatment guidelines, considered by other Pain Management Practitioners as being common knowledge and practice, at the time of the intervention. For Medico-Legal purposes, it is also important to point out that variation in procedural techniques and pharmacological choices are the acceptable norm. The indications, contraindications, technique, and results of the above procedure should only be interpreted and judged by a Board-Certified Interventional Pain Specialist with extensive familiarity and expertise in the same exact procedure and technique.

## 2023-07-01 ENCOUNTER — Ambulatory Visit: Payer: Medicare Other | Attending: Pain Medicine | Admitting: Pain Medicine

## 2023-07-01 ENCOUNTER — Encounter: Payer: Self-pay | Admitting: Pain Medicine

## 2023-07-01 ENCOUNTER — Ambulatory Visit
Admission: RE | Admit: 2023-07-01 | Discharge: 2023-07-01 | Disposition: A | Discharge status: Home / Self Care | Payer: Medicare Other | Source: Ambulatory Visit | Attending: Pain Medicine | Admitting: Pain Medicine

## 2023-07-01 VITALS — BP 137/61 | HR 63 | Temp 97.3°F | Resp 11 | Ht 63.0 in | Wt 114.0 lb

## 2023-07-01 DIAGNOSIS — M4317 Spondylolisthesis, lumbosacral region: Secondary | ICD-10-CM | POA: Insufficient documentation

## 2023-07-01 DIAGNOSIS — M5441 Lumbago with sciatica, right side: Secondary | ICD-10-CM | POA: Diagnosis not present

## 2023-07-01 DIAGNOSIS — M5416 Radiculopathy, lumbar region: Secondary | ICD-10-CM | POA: Insufficient documentation

## 2023-07-01 DIAGNOSIS — G8929 Other chronic pain: Secondary | ICD-10-CM | POA: Insufficient documentation

## 2023-07-01 DIAGNOSIS — M51371 Other intervertebral disc degeneration, lumbosacral region with lower extremity pain only: Secondary | ICD-10-CM | POA: Diagnosis not present

## 2023-07-01 DIAGNOSIS — S22080S Wedge compression fracture of T11-T12 vertebra, sequela: Secondary | ICD-10-CM | POA: Insufficient documentation

## 2023-07-01 DIAGNOSIS — M48062 Spinal stenosis, lumbar region with neurogenic claudication: Secondary | ICD-10-CM | POA: Diagnosis not present

## 2023-07-01 DIAGNOSIS — R937 Abnormal findings on diagnostic imaging of other parts of musculoskeletal system: Secondary | ICD-10-CM | POA: Insufficient documentation

## 2023-07-01 DIAGNOSIS — M48061 Spinal stenosis, lumbar region without neurogenic claudication: Secondary | ICD-10-CM | POA: Diagnosis not present

## 2023-07-01 DIAGNOSIS — H9193 Unspecified hearing loss, bilateral: Secondary | ICD-10-CM | POA: Diagnosis not present

## 2023-07-01 MED ORDER — IOHEXOL 180 MG/ML  SOLN
10.0000 mL | Freq: Once | INTRAMUSCULAR | Status: AC
  Administered 2023-07-01: 10 mL via EPIDURAL
  Filled 2023-07-01: qty 20

## 2023-07-01 MED ORDER — PENTAFLUOROPROP-TETRAFLUOROETH EX AERO
INHALATION_SPRAY | Freq: Once | CUTANEOUS | Status: AC
  Administered 2023-07-01: 30 via TOPICAL

## 2023-07-01 MED ORDER — LIDOCAINE HCL 2 % IJ SOLN
20.0000 mL | Freq: Once | INTRAMUSCULAR | Status: AC
  Administered 2023-07-01: 400 mg
  Filled 2023-07-01: qty 20

## 2023-07-01 MED ORDER — SODIUM CHLORIDE 0.9% FLUSH
2.0000 mL | Freq: Once | INTRAVENOUS | Status: AC
  Administered 2023-07-01: 2 mL

## 2023-07-01 MED ORDER — ROPIVACAINE HCL 2 MG/ML IJ SOLN
2.0000 mL | Freq: Once | INTRAMUSCULAR | Status: AC
  Administered 2023-07-01: 2 mL via EPIDURAL
  Filled 2023-07-01: qty 20

## 2023-07-01 MED ORDER — SODIUM CHLORIDE (PF) 0.9 % IJ SOLN
INTRAMUSCULAR | Status: AC
  Filled 2023-07-01: qty 10

## 2023-07-01 MED ORDER — TRIAMCINOLONE ACETONIDE 40 MG/ML IJ SUSP
40.0000 mg | Freq: Once | INTRAMUSCULAR | Status: AC
  Administered 2023-07-01: 40 mg
  Filled 2023-07-01: qty 1

## 2023-07-01 NOTE — Patient Instructions (Signed)

## 2023-07-01 NOTE — Progress Notes (Signed)
Safety precautions to be maintained throughout the outpatient stay will include: orient to surroundings, keep bed in low position, maintain call bell within reach at all times, provide assistance with transfer out of bed and ambulation.  

## 2023-07-02 ENCOUNTER — Other Ambulatory Visit: Payer: Self-pay | Admitting: Family Medicine

## 2023-07-02 DIAGNOSIS — N183 Chronic kidney disease, stage 3 unspecified: Secondary | ICD-10-CM

## 2023-07-04 NOTE — Telephone Encounter (Signed)
Reordered 05/07/23 #135    Requested Prescriptions  Refused Prescriptions Disp Refills   furosemide (LASIX) 20 MG tablet [Pharmacy Med Name: FUROSEMIDE 20 MG TAB] 135 tablet 0    Sig: TAKE 1 and 1/2 TABLETS BY MOUTH ONCE DAILY     Cardiovascular:  Diuretics - Loop Failed - 07/02/2023  1:13 PM      Failed - Mg Level in normal range and within 180 days    Magnesium  Date Value Ref Range Status  01/14/2014 2.1 mg/dL Final    Comment:    9.6-2.9 THERAPEUTIC RANGE: 4-7 mg/dL TOXIC: > 10 mg/dL  -----------------------          Passed - K in normal range and within 180 days    Potassium  Date Value Ref Range Status  04/21/2023 4.2 3.5 - 5.3 mmol/L Final  11/24/2014 4.2 mmol/L Final    Comment:    3.5-5.1 NOTE: New Reference Range  10/04/14          Passed - Ca in normal range and within 180 days    Calcium  Date Value Ref Range Status  04/21/2023 9.3 8.6 - 10.4 mg/dL Final   Calcium, Total  Date Value Ref Range Status  11/24/2014 8.9 mg/dL Final    Comment:    5.2-84.1 NOTE: New Reference Range  10/04/14          Passed - Na in normal range and within 180 days    Sodium  Date Value Ref Range Status  04/21/2023 137 135 - 146 mmol/L Final  11/24/2014 131 (L) mmol/L Final    Comment:    135-145 NOTE: New Reference Range  10/04/14          Passed - Cr in normal range and within 180 days    Creat  Date Value Ref Range Status  04/21/2023 0.79 0.60 - 0.95 mg/dL Final         Passed - Cl in normal range and within 180 days    Chloride  Date Value Ref Range Status  04/21/2023 99 98 - 110 mmol/L Final  11/24/2014 97 (L) mmol/L Final    Comment:    101-111 NOTE: New Reference Range  10/04/14          Passed - Last BP in normal range    BP Readings from Last 1 Encounters:  07/01/23 137/61         Passed - Valid encounter within last 6 months    Recent Outpatient Visits           2 months ago Benign hypertension with CKD (chronic kidney disease) stage  III [facility])   [place] [facility] Smitty Cords, DO   1 year ago Annual physical exam   [place] [facility] [facility] Smitty Cords, DO   1 year ago RLS (restless legs syndrome)   [place] [facility] Smitty Cords, DO   1 year ago Benign hypertension with CKD (chronic kidney disease) stage III [facility])   [place] [facility] Smitty Cords, DO   2 years ago Benign hypertension with CKD (chronic kidney disease) stage III [facility])   [place] [facility] Smitty Cords, DO       Future Appointments             In 11 months MacDiarmid, Lorin Picket, MD [facility]

## 2023-07-08 ENCOUNTER — Other Ambulatory Visit: Payer: Self-pay | Admitting: Internal Medicine

## 2023-07-08 ENCOUNTER — Other Ambulatory Visit: Payer: Self-pay | Admitting: Family Medicine

## 2023-07-08 DIAGNOSIS — I129 Hypertensive chronic kidney disease with stage 1 through stage 4 chronic kidney disease, or unspecified chronic kidney disease: Secondary | ICD-10-CM

## 2023-07-08 DIAGNOSIS — F419 Anxiety disorder, unspecified: Secondary | ICD-10-CM

## 2023-07-09 NOTE — Telephone Encounter (Signed)
Requested Prescriptions  Pending Prescriptions Disp Refills   amLODipine (NORVASC) 5 MG tablet [Pharmacy Med Name: AMLODIPINE BESYLATE 5 MG TAB] 90 tablet 0    Sig: TAKE 1 TABLET BY MOUTH ONCE DAILY WITH LUNCH     Cardiovascular: Calcium Channel Blockers 2 Passed - 07/08/2023 11:06 AM      Passed - Last BP in normal range    BP Readings from Last 1 Encounters:  07/01/23 137/61         Passed - Last Heart Rate in normal range    Pulse Readings from Last 1 Encounters:  07/01/23 63         Passed - Valid encounter within last 6 months    Recent Outpatient Visits           2 months ago Benign hypertension with CKD (chronic kidney disease) stage III [facility])   [place] [facility] [place], Netta Neat, DO   1 year ago Annual physical exam   [place] [facility] Smitty Cords, DO   1 year ago RLS (restless legs syndrome)   [place] [facility] Smitty Cords, DO   1 year ago Benign hypertension with CKD (chronic kidney disease) stage III [facility])   [place] [facility] [place], Netta Neat, DO   2 years ago Benign hypertension with CKD (chronic kidney disease) stage III [facility])   [place] [facility] [place], Netta Neat, DO       Future Appointments             In 10 months MacDiarmid, Lorin Picket, MD [facility] [place]             lisinopril (ZESTRIL) 40 MG tablet [Pharmacy Med Name: LISINOPRIL 40 MG TAB] 90 tablet 0    Sig: TAKE 1 TABLET BY MOUTH ONCE DAILY     Cardiovascular:  ACE Inhibitors Passed - 07/08/2023 11:06 AM      Passed - Cr in normal range and within 180 days    Creat  Date Value Ref Range Status  04/21/2023 0.79 0.60 - 0.95 mg/dL Final         Passed - K in normal range and within 180 days    Potassium  Date Value Ref Range Status  04/21/2023 4.2 3.5 - 5.3 mmol/L Final  11/24/2014 4.2 mmol/L Final    Comment:     3.5-5.1 NOTE: New Reference Range  10/04/14          Passed - Patient is not pregnant      Passed - Last BP in normal range    BP Readings from Last 1 Encounters:  07/01/23 137/61         Passed - Valid encounter within last 6 months    Recent Outpatient Visits           2 months ago Benign hypertension with CKD (chronic kidney disease) stage III [facility])   [place] [facility] Smitty Cords, DO   1 year ago Annual physical exam   [place] [facility] Smitty Cords, DO   1 year ago RLS (restless legs syndrome)   [place] [facility] Smitty Cords, DO   1 year ago Benign hypertension with CKD (chronic kidney disease) stage III [facility])   [place] [facility] [place], Netta Neat, DO   2 years ago Benign hypertension with CKD (chronic  kidney disease) stage III [facility])   [place] [facility] [place] [place], Netta Neat, DO       Future Appointments             In 10 months MacDiarmid, Lorin Picket, MD [facility]

## 2023-07-09 NOTE — Telephone Encounter (Signed)
Requested medication (s) are due for refill today: Yes  Requested medication (s) are on the active medication list: Yes  Last refill:  06/12/23  Future visit scheduled: No  Notes to clinic:  Not delegated.    Requested Prescriptions  Pending Prescriptions Disp Refills   clonazePAM (KLONOPIN) 0.5 MG tablet [Pharmacy Med Name: CLONAZEPAM 0.5 MG TAB] 30 tablet     Sig: TAKE 1/2 TABLET BY MOUTH TWICE DAILY     Not Delegated - Psychiatry: Anxiolytics/Hypnotics 2 Failed - 07/08/2023 11:03 AM      Failed - This refill cannot be delegated      Failed - Urine Drug Screen completed in last 360 days      Passed - Patient is not pregnant      Passed - Valid encounter within last 6 months    Recent Outpatient Visits           2 months ago Benign hypertension with CKD (chronic kidney disease) stage III [facility])   [place] [facility] [place], Netta Neat, DO   1 year ago Annual physical exam   [place] [facility] Smitty Cords, DO   1 year ago RLS (restless legs syndrome)   [place] [facility] Smitty Cords, DO   1 year ago Benign hypertension with CKD (chronic kidney disease) stage III [facility])   [place] [facility] [place], Netta Neat, DO   2 years ago Benign hypertension with CKD (chronic kidney disease) stage III [facility])   [place] [facility] [place], Netta Neat, DO       Future Appointments             In 10 months MacDiarmid, Lorin Picket, MD [facility]

## 2023-07-11 ENCOUNTER — Other Ambulatory Visit: Payer: Self-pay | Admitting: *Deleted

## 2023-07-11 DIAGNOSIS — R339 Retention of urine, unspecified: Secondary | ICD-10-CM

## 2023-07-11 MED ORDER — TAMSULOSIN HCL 0.4 MG PO CAPS: 0.8000 mg | ORAL_CAPSULE | Freq: Every day | ORAL | 11 refills | Status: DC

## 2023-07-15 DIAGNOSIS — I10 Essential (primary) hypertension: Secondary | ICD-10-CM | POA: Diagnosis not present

## 2023-07-15 DIAGNOSIS — N390 Urinary tract infection, site not specified: Secondary | ICD-10-CM | POA: Diagnosis not present

## 2023-07-15 DIAGNOSIS — E871 Hypo-osmolality and hyponatremia: Secondary | ICD-10-CM | POA: Diagnosis not present

## 2023-07-15 DIAGNOSIS — N183 Chronic kidney disease, stage 3 unspecified: Secondary | ICD-10-CM | POA: Diagnosis not present

## 2023-07-15 DIAGNOSIS — R600 Localized edema: Secondary | ICD-10-CM | POA: Diagnosis not present

## 2023-07-16 ENCOUNTER — Encounter: Payer: Self-pay | Admitting: Pain Medicine

## 2023-07-16 NOTE — Progress Notes (Unsigned)
Patient: Brittney Simpson  Service Category: E/M  Provider: Oswaldo Done, MD  DOB: February 08, 1928  DOS: 07/17/2023  Location: Office  MRN: [id number]  Setting: Ambulatory outpatient  Referring Provider: Saralyn Pilar *  Type: Established Patient  Specialty: Interventional Pain Management  PCP: Smitty Cords, DO  Location: Remote location  Delivery: TeleHealth     Virtual Encounter - Pain Management PROVIDER NOTE: Information contained herein reflects review and annotations entered in association with encounter. Interpretation of such information and data should be left to medically-trained personnel. Information provided to patient can be located elsewhere in the medical record under "Patient Instructions". Document created using STT-dictation technology, any transcriptional errors that may result from process are unintentional.    Contact & Pharmacy Preferred: [phone] Home: [phone] (home) Mobile: [phone] (mobile) E-mail: [email or web address]  [facility] - [place], [place] - [address] [place] [address] Phone: [phone] Fax: [phone]   Pre-screening  Ms. Schaller offered "in-person" vs "virtual" encounter. She indicated preferring virtual for this encounter.   Reason COVID-19*  Social distancing based on CDC and AMA recommendations.   I contacted ADDIS BOBIK on 07/17/2023 via telephone.      I clearly identified myself as Oswaldo Done, MD. I verified that I was speaking with the correct person using two identifiers (Name: Brittney Simpson, and date of birth: 11-21-1927).  Consent I sought verbal advanced consent from [name] for virtual visit interactions. I informed Ms. Bretz of possible security and privacy concerns, risks, and limitations associated with providing "not-in-person" medical evaluation and management services. I also informed Ms. Antell of the availability of "in-person"  appointments. Finally, I informed her that there would be a charge for the virtual visit and that she could be  personally, fully or partially, financially responsible for it. Ms. Borgelt expressed understanding and agreed to proceed.   Historic Elements   Brittney Simpson is a 89 y.o. year old, female patient evaluated today after our last contact on 07/01/2023. Ms. Kozloff  has a past medical history of Anxiety, Arthritis, Back ache, Hiatal hernia, Hyperlipidemia, Hypertension, Hypothyroid, Osteopenia, Thyroid disease, and UTI (urinary tract infection). She also  has a past surgical history that includes Hernia repair; Hernia repair; Hemorrhoid surgery; Kyphoplasty (N/A, 01/23/2017); Kyphoplasty (N/A, 01/25/2020); and Cholecystectomy. Ms. Foutz has a current medication list which includes the following prescription(s): acetaminophen, amlodipine, brimonidine, vitamin d3, clonazepam, cranberry, furosemide, latanoprost, levothyroxine, lisinopril, melatonin, metoprolol succinate, omeprazole, tamsulosin, tramadol, and trazodone. She  reports that she has never smoked. She has never used smokeless tobacco. She reports that she does not drink alcohol and does not use drugs. Ms. Gruis has no known allergies.  BMI: Estimated body mass index is 20.19 kg/m as calculated from the following:   Height as of 07/01/23: 5\' 3"  (1.6 m).   Weight as of 07/01/23: 114 lb (51.7 kg). Last encounter: 04/07/2023. Last procedure: 07/01/2023.  HPI  Today, she is being contacted for a post-procedure assessment.  Post-procedure evaluation   Type: Lumbar epidural steroid injection (LESI) (interlaminar)  #10 (since 08/22/2015)  Laterality: Right   Level:  L1-2 Level.  Imaging: Fluoroscopic guidance Spinal ([id number]) Anesthesia: Local anesthesia (1-2% Lidocaine) Anxiolysis: None                 Sedation: No Sedation                       DOS: 07/01/2023  Performed by:  Oswaldo Done, MD  Purpose:  Diagnostic/Therapeutic Indications: Lumbar radicular pain of intraspinal etiology of more than 4 weeks that has failed to respond to conservative therapy and is severe enough to impact quality of life or function. 1. Chronic low back pain (Right) w/ radicular pain (Right)   2. Chronic lumbar radicular pain (Right)   3. Degeneration of intervertebral disc of lumbosacral region with lower extremity pain   4. T12 compression fracture, sequela   5. Spondylolisthesis of lumbosacral region (L2-3 and L5-S1)   6. Spinal stenosis of lumbar region with neurogenic claudication   7. Lumbar lateral recess stenosis   8. Lumbar foraminal stenosis   9. Abnormal MRI, lumbar spine (02/02/2020)   10. Bilateral hearing loss, unspecified hearing loss type    NAS-11 Pain score:   Pre-procedure: 8 /10   Post-procedure: 0-No pain/10     Effectiveness:  Initial hour after procedure: 100 %. Subsequent 4-6 hours post-procedure: 100 %. Analgesia past initial 6 hours: 75 %. Ongoing improvement:  Analgesic: According to Okey Regal, the patient's daughter and caretaker, Ms. Gaylene Brooks attained 100% relief of the pain for the duration of the local anesthetic followed by a decrease to an ongoing 75% improvement of her low back pain. Function: Ms. Clere reports improvement in function ROM: Ms. Gittens reports improvement in ROM  Pharmacotherapy Assessment   Opioid Analgesic:  Tramadol 50 mg, 1 tab PO q 8 hrs (150 mg/day of tramadol) MME/day: 15 mg/day.   Monitoring: [place] PMP: PDMP reviewed during this encounter.       Pharmacotherapy: No side-effects or adverse reactions reported. Compliance: No problems identified. Effectiveness: Clinically acceptable. Plan: Refer to "POC". UDS:  Summary  Date Value Ref Range Status  04/07/2023 Note  Final    Comment:    ==================================================================== [employer] Select 13  (MW) ==================================================================== Test                             Result       Flag       Units  Drug Present and Declared for Prescription Verification   7-aminoclonazepam              255          EXPECTED   ng/mg creat    7-aminoclonazepam is an expected metabolite of clonazepam. Source of    clonazepam is a scheduled prescription medication.    Tramadol                       >25000       EXPECTED   ng/mg creat   O-Desmethyltramadol            >25000       EXPECTED   ng/mg creat   N-Desmethyltramadol            7320         EXPECTED   ng/mg creat    Source of tramadol is a prescription medication. O-desmethyltramadol    and N-desmethyltramadol are expected metabolites of tramadol.  ==================================================================== Test                      Result    Flag   Units      Ref Range   Creatinine              20  mg/dL      >=96 ==================================================================== Declared Medications:  The flagging and interpretation on this report are based on the  following declared medications.  Unexpected results may arise from  inaccuracies in the declared medications.   **Note: The testing scope of this panel includes these medications:   Clonazepam (Klonopin)  Tramadol (Ultram)   **Note: The testing scope of this panel does not include the  following reported medications:   Acetaminophen (Tylenol)  Amlodipine (Norvasc)  Cranberry  Eye Drops  Furosemide (Lasix)  Levothyroxine (Synthroid)  Lisinopril (Zestril)  Melatonin  Methocarbamol (Robaxin)  Metoprolol (Toprol)  Naloxone (Narcan)  Omeprazole (Prilosec)  Tamsulosin (Flomax)  Trazodone (Desyrel)  Vitamin D3 ==================================================================== For clinical consultation, please call [phone]. ====================================================================    No  results found for: "CBDTHCR", "D8THCCBX", "D9THCCBX"   Laboratory Chemistry Profile   Renal Lab Results  Component Value Date   BUN 9 04/21/2023   CREATININE 0.79 04/21/2023   BCR SEE NOTE: 04/21/2023   GFRAA 76 05/16/2020   GFRNONAA 66 05/16/2020    Hepatic Lab Results  Component Value Date   AST 8 (L) 04/21/2023   ALT 6 04/21/2023   ALBUMIN 3.6 11/22/2016   ALKPHOS 59 11/22/2016   LIPASE 21 06/14/2016    Electrolytes Lab Results  Component Value Date   NA 137 04/21/2023   K 4.2 04/21/2023   CL 99 04/21/2023   CALCIUM 9.3 04/21/2023   MG 2.1 01/14/2014    Bone No results found for: "VD25OH", "VD125OH2TOT", "MW1027OZ3", "GU4403KV4", "25OHVITD1", "25OHVITD2", "25OHVITD3", "TESTOFREE", "TESTOSTERONE"  Inflammation (CRP: Acute Phase) (ESR: Chronic Phase) No results found for: "CRP", "ESRSEDRATE", "LATICACIDVEN"       Note: Above Lab results reviewed.  Imaging  DG PAIN CLINIC C-ARM 1-60 MIN NO REPORT Fluoro was used, but no Radiologist interpretation will be provided.  Please refer to "NOTES" tab for provider progress note.  Assessment  The primary encounter diagnosis was Chronic low back pain (Right) w/ radicular pain (Right). Diagnoses of Chronic lumbar radicular pain (Right), Degeneration of intervertebral disc of lumbosacral region with lower extremity pain, and Postop check were also pertinent to this visit.  Plan of Care  Problem-specific:  No problem-specific Assessment & Plan notes found for this encounter.  Ms. DACOTAH RIAL has a current medication list which includes the following long-term medication(s): amlodipine, clonazepam, furosemide, levothyroxine, lisinopril, metoprolol succinate, omeprazole, tramadol, and trazodone.  Pharmacotherapy (Medications Ordered): No orders of the defined types were placed in this encounter.  Orders:  Orders Placed This Encounter  Procedures   Lumbar Epidural Injection    Standing Status:   Standing    Number of  Occurrences:   1    Expiration Date:   07/16/2024    Scheduling Instructions:     Procedure: Interlaminar Lumbar Epidural Steroid injection (LESI)  L1-2     Laterality: Right-sided     Sedation: None required.     Timeframe: PRN    Where will this procedure be performed?:   [facility]   Follow-up plan:   Return if symptoms worsen or fail to improve, for scheduled encounter.      Interventional Therapies  Risk  Complexity Considerations:   Advanced age  HOH  AAA  HTN  Stage3 CKD  GERD   Note: Hard of hearing   Planned  Pending:   (PRN) palliative right L1-2 LESI #11    Under consideration:   (PRN) palliative right L1-2 LESI #10 (07/01/2023)    Completed:  Palliative right L1-2 LESI x9 (01/21/2023) (100/100/80/80)  Therapeutic right L2-3 LESI x1 (03/07/2020) (100/100/25/<50)  Diagnostic/therapeutic bilateral SI joint injection x1 (02/10/2020) (100/100/100 x2 days/0)  Palliative right lumbar facet MBB x13 (01/04/2020) (8 to 0) (100/100/100)  Palliative left lumbar facet MBB x11 (01/04/2020) (8 to 0) (100/100/100)    Therapeutic  Palliative (PRN) options:   Palliative right T12-L1 LESI (PRN)   Pharmacotherapy  Nonopioids transferred 04/11/2020: Robaxin       Recent Visits Date Type Provider Dept  07/01/23 Procedure visit Delano Metz, MD [facility]  Showing recent visits within past 90 days and meeting all other requirements Today's Visits Date Type Provider Dept  07/17/23 Office Visit Delano Metz, MD [facility]  Showing today's visits and meeting all other requirements Future Appointments Date Type Provider Dept  10/01/23 Appointment Delano Metz, MD [facility]  Showing future appointments within next 90 days and meeting all other requirements  I discussed the assessment and treatment plan with the patient. The patient was provided an opportunity to ask questions and all were answered. The patient  agreed with the plan and demonstrated an understanding of the instructions.  Patient advised to call back or seek an in-person evaluation if the symptoms or condition worsens.  Duration of encounter: 12 minutes.  Note by: Oswaldo Done, MD Date: 07/17/2023; Time: 1:35 PM

## 2023-07-17 ENCOUNTER — Ambulatory Visit: Payer: Medicare Other | Attending: Pain Medicine | Admitting: Pain Medicine

## 2023-07-17 DIAGNOSIS — M5416 Radiculopathy, lumbar region: Secondary | ICD-10-CM | POA: Diagnosis not present

## 2023-07-17 DIAGNOSIS — M51371 Other intervertebral disc degeneration, lumbosacral region with lower extremity pain only: Secondary | ICD-10-CM | POA: Diagnosis not present

## 2023-07-17 DIAGNOSIS — G8929 Other chronic pain: Secondary | ICD-10-CM

## 2023-07-17 DIAGNOSIS — M5441 Lumbago with sciatica, right side: Secondary | ICD-10-CM

## 2023-07-17 DIAGNOSIS — Z09 Encounter for follow-up examination after completed treatment for conditions other than malignant neoplasm: Secondary | ICD-10-CM

## 2023-07-31 ENCOUNTER — Other Ambulatory Visit: Payer: Self-pay | Admitting: Family Medicine

## 2023-07-31 DIAGNOSIS — N183 Chronic kidney disease, stage 3 unspecified: Secondary | ICD-10-CM

## 2023-08-04 NOTE — Telephone Encounter (Signed)
 Requested Prescriptions  Pending Prescriptions Disp Refills   furosemide  (LASIX ) 20 MG tablet [Pharmacy Med Name: FUROSEMIDE  20 MG TAB] 135 tablet 0    Sig: TAKE 1 and 1/2 TABLETS BY MOUTH ONCE DAILY     Cardiovascular:  Diuretics - Loop Failed - 08/04/2023 12:28 PM      Failed - Mg Level in normal range and within 180 days    Magnesium   Date Value Ref Range Status  01/14/2014 2.1 mg/dL Final    Comment:    8.1-7.5 THERAPEUTIC RANGE: 4-7 mg/dL TOXIC: > 10 mg/dL  -----------------------          Passed - K in normal range and within 180 days    Potassium  Date Value Ref Range Status  04/21/2023 4.2 3.5 - 5.3 mmol/L Final  11/24/2014 4.2 mmol/L Final    Comment:    3.5-5.1 NOTE: New Reference Range  10/04/14          Passed - Ca in normal range and within 180 days    Calcium  Date Value Ref Range Status  04/21/2023 9.3 8.6 - 10.4 mg/dL Final   Calcium, Total  Date Value Ref Range Status  11/24/2014 8.9 mg/dL Final    Comment:    1.0-89.6 NOTE: New Reference Range  10/04/14          Passed - Na in normal range and within 180 days    Sodium  Date Value Ref Range Status  04/21/2023 137 135 - 146 mmol/L Final  11/24/2014 131 (L) mmol/L Final    Comment:    135-145 NOTE: New Reference Range  10/04/14          Passed - Cr in normal range and within 180 days    Creat  Date Value Ref Range Status  04/21/2023 0.79 0.60 - 0.95 mg/dL Final         Passed - Cl in normal range and within 180 days    Chloride  Date Value Ref Range Status  04/21/2023 99 98 - 110 mmol/L Final  11/24/2014 97 (L) mmol/L Final    Comment:    101-111 NOTE: New Reference Range  10/04/14          Passed - Last BP in normal range    BP Readings from Last 1 Encounters:  07/01/23 137/61         Passed - Valid encounter within last 6 months    Recent Outpatient Visits           3 months ago Benign hypertension with CKD (chronic kidney disease) stage III [facility])   [place]  [facility] [place] PARAS, DO   1 year ago Annual physical exam   [place] [facility] [place] PARAS, DO   1 year ago RLS (restless legs syndrome)   [place] [facility] [place] PARAS, DO   1 year ago Benign hypertension with CKD (chronic kidney disease) stage III [facility])   [place] [facility] [place]a PARAS, DO   2 years ago Benign hypertension with CKD (chronic kidney disease) stage III [facility])   [facility] [place]a PARAS, DO       Future Appointments             In 10 months MacDiarmid, [place], MD [facility]

## 2023-08-13 ENCOUNTER — Other Ambulatory Visit: Payer: Self-pay | Admitting: Family Medicine

## 2023-08-13 DIAGNOSIS — I129 Hypertensive chronic kidney disease with stage 1 through stage 4 chronic kidney disease, or unspecified chronic kidney disease: Secondary | ICD-10-CM

## 2023-08-14 NOTE — Telephone Encounter (Signed)
A little early- but patient not due appointment yet- will fill #[phone] Requested Prescriptions  Pending Prescriptions Disp Refills   metoprolol succinate (TOPROL-XL) 100 MG 24 hr tablet [Pharmacy Med Name: METOPROLOL SUCCINATE ER 100 MG TAB] 90 tablet 0    Sig: TAKE 1 TABLET BY MOUTH ONCE DAILY WITH FOOD     Cardiovascular:  Beta Blockers Passed - 08/14/2023 11:06 AM      Passed - Last BP in normal range    BP Readings from Last 1 Encounters:  07/01/23 137/61         Passed - Last Heart Rate in normal range    Pulse Readings from Last 1 Encounters:  07/01/23 63         Passed - Valid encounter within last 6 months    Recent Outpatient Visits           3 months ago Benign hypertension with CKD (chronic kidney disease) stage III [facility])   [place] [facility] [place], Netta Neat, DO   1 year ago Annual physical exam   [place] [facility] Smitty Cords, DO   1 year ago RLS (restless legs syndrome)   [place] [facility] Smitty Cords, DO   1 year ago Benign hypertension with CKD (chronic kidney disease) stage III [facility])   [place] [facility] [place], Netta Neat, DO   2 years ago Benign hypertension with CKD (chronic kidney disease) stage III [facility])   [place] [facility] [place], Netta Neat, DO       Future Appointments             In 9 months MacDiarmid, Lorin Picket, MD [facility]

## 2023-09-03 ENCOUNTER — Other Ambulatory Visit: Payer: Self-pay | Admitting: Family Medicine

## 2023-09-03 DIAGNOSIS — K219 Gastro-esophageal reflux disease without esophagitis: Secondary | ICD-10-CM

## 2023-09-04 NOTE — Telephone Encounter (Signed)
 Requested Prescriptions  Pending Prescriptions Disp Refills   omeprazole  (PRILOSEC) 20 MG capsule [Pharmacy Med Name: OMEPRAZOLE  DR 20 MG CAP] 90 capsule 1    Sig: TAKE 1 CAPSULE BY MOUTH ONCE DAILY BEFORE BREAKFAST     Gastroenterology: Proton Pump Inhibitors Passed - 09/04/2023  3:55 PM      Passed - Valid encounter within last 12 months    Recent Outpatient Visits           4 months ago Benign hypertension with CKD (chronic kidney disease) stage III [facility])   [place] [facility] [place], Marsa PARAS, DO   1 year ago Annual physical exam   [place] [facility] [place] PARAS, DO   1 year ago RLS (restless legs syndrome)   [place] [facility] [place] PARAS, DO   1 year ago Benign hypertension with CKD (chronic kidney disease) stage III [facility])   [place] [facility] [place], Marsa PARAS, DO   2 years ago Benign hypertension with CKD (chronic kidney disease) stage III [facility])   [place] [facility] [place] Marsa PARAS, DO       Future Appointments             In 9 months MacDiarmid, [place]ia, MD [place] [facility]

## 2023-09-08 DIAGNOSIS — H401133 Primary open-angle glaucoma, bilateral, severe stage: Secondary | ICD-10-CM | POA: Diagnosis not present

## 2023-09-15 DIAGNOSIS — H18513 Endothelial corneal dystrophy, bilateral: Secondary | ICD-10-CM | POA: Diagnosis not present

## 2023-09-15 DIAGNOSIS — Z961 Presence of intraocular lens: Secondary | ICD-10-CM | POA: Diagnosis not present

## 2023-09-15 DIAGNOSIS — H401133 Primary open-angle glaucoma, bilateral, severe stage: Secondary | ICD-10-CM | POA: Diagnosis not present

## 2023-09-23 ENCOUNTER — Ambulatory Visit
Admission: RE | Admit: 2023-09-23 | Discharge: 2023-09-23 | Disposition: A | Discharge status: Home / Self Care | Payer: Medicare Other | Source: Ambulatory Visit | Attending: Pain Medicine | Admitting: Pain Medicine

## 2023-09-23 ENCOUNTER — Encounter: Payer: Self-pay | Admitting: Pain Medicine

## 2023-09-23 ENCOUNTER — Ambulatory Visit: Payer: Medicare Other | Attending: Pain Medicine | Admitting: Pain Medicine

## 2023-09-23 VITALS — BP 172/83 | HR 58 | Temp 97.2°F | Resp 18 | Ht 63.0 in | Wt 114.0 lb

## 2023-09-23 DIAGNOSIS — M25551 Pain in right hip: Secondary | ICD-10-CM | POA: Diagnosis not present

## 2023-09-23 DIAGNOSIS — M5441 Lumbago with sciatica, right side: Secondary | ICD-10-CM | POA: Insufficient documentation

## 2023-09-23 DIAGNOSIS — M4854XS Collapsed vertebra, not elsewhere classified, thoracic region, sequela of fracture: Secondary | ICD-10-CM | POA: Diagnosis not present

## 2023-09-23 DIAGNOSIS — M4317 Spondylolisthesis, lumbosacral region: Secondary | ICD-10-CM

## 2023-09-23 DIAGNOSIS — M545 Low back pain, unspecified: Secondary | ICD-10-CM

## 2023-09-23 DIAGNOSIS — G894 Chronic pain syndrome: Secondary | ICD-10-CM | POA: Diagnosis not present

## 2023-09-23 DIAGNOSIS — S22080S Wedge compression fracture of T11-T12 vertebra, sequela: Secondary | ICD-10-CM

## 2023-09-23 DIAGNOSIS — M79604 Pain in right leg: Secondary | ICD-10-CM | POA: Insufficient documentation

## 2023-09-23 DIAGNOSIS — M5135 Other intervertebral disc degeneration, thoracolumbar region: Secondary | ICD-10-CM

## 2023-09-23 DIAGNOSIS — G8929 Other chronic pain: Secondary | ICD-10-CM

## 2023-09-23 DIAGNOSIS — R937 Abnormal findings on diagnostic imaging of other parts of musculoskeletal system: Secondary | ICD-10-CM

## 2023-09-23 DIAGNOSIS — Z79891 Long term (current) use of opiate analgesic: Secondary | ICD-10-CM

## 2023-09-23 DIAGNOSIS — M47816 Spondylosis without myelopathy or radiculopathy, lumbar region: Secondary | ICD-10-CM | POA: Diagnosis not present

## 2023-09-23 DIAGNOSIS — M533 Sacrococcygeal disorders, not elsewhere classified: Secondary | ICD-10-CM | POA: Diagnosis not present

## 2023-09-23 DIAGNOSIS — Z79899 Other long term (current) drug therapy: Secondary | ICD-10-CM | POA: Diagnosis not present

## 2023-09-23 DIAGNOSIS — M48062 Spinal stenosis, lumbar region with neurogenic claudication: Secondary | ICD-10-CM

## 2023-09-23 DIAGNOSIS — M51372 Other intervertebral disc degeneration, lumbosacral region with discogenic back pain and lower extremity pain: Secondary | ICD-10-CM | POA: Diagnosis not present

## 2023-09-23 MED ORDER — TRAMADOL HCL 50 MG PO TABS: 50.0000 mg | ORAL_TABLET | Freq: Three times a day (TID) | ORAL | 5 refills | Status: DC

## 2023-09-23 MED ORDER — SODIUM CHLORIDE 0.9% FLUSH
2.0000 mL | Freq: Once | INTRAVENOUS | Status: AC
Start: 2023-09-23 — End: 2023-09-23
  Administered 2023-09-23: 2 mL

## 2023-09-23 MED ORDER — LIDOCAINE HCL 2 % IJ SOLN
20.0000 mL | Freq: Once | INTRAMUSCULAR | Status: AC
  Administered 2023-09-23: 100 mg
  Filled 2023-09-23: qty 40

## 2023-09-23 MED ORDER — TRIAMCINOLONE ACETONIDE 40 MG/ML IJ SUSP
40.0000 mg | Freq: Once | INTRAMUSCULAR | Status: AC
Start: 2023-09-23 — End: 2023-09-23
  Administered 2023-09-23: 40 mg
  Filled 2023-09-23: qty 1

## 2023-09-23 MED ORDER — PENTAFLUOROPROP-TETRAFLUOROETH EX AERO
INHALATION_SPRAY | Freq: Once | CUTANEOUS | Status: AC
  Administered 2023-09-23: 30 via TOPICAL

## 2023-09-23 MED ORDER — IOHEXOL 180 MG/ML  SOLN
10.0000 mL | Freq: Once | INTRAMUSCULAR | Status: AC
  Administered 2023-09-23: 10 mL via EPIDURAL
  Filled 2023-09-23: qty 20

## 2023-09-23 MED ORDER — ROPIVACAINE HCL 2 MG/ML IJ SOLN
2.0000 mL | Freq: Once | INTRAMUSCULAR | Status: AC
  Administered 2023-09-23: 2 mL via EPIDURAL
  Filled 2023-09-23: qty 20

## 2023-09-23 NOTE — Progress Notes (Signed)
 PROVIDER NOTE: Interpretation of information contained herein should be left to medically-trained personnel. Specific patient instructions are provided elsewhere under "Patient Instructions" section of medical record. This document was created in part using STT-dictation technology, any transcriptional errors that may result from this process are unintentional.  Patient: Brittney Simpson Type: Established DOB: Jul 16, 1928 MRN: [id number] PCP: Smitty Cords, DO  Service: Procedure DOS: 09/23/2023 Setting: Ambulatory Location: Ambulatory outpatient facility Delivery: Face-to-face Provider: Oswaldo Done, MD Specialty: Interventional Pain Management Specialty designation: 09 Location: Outpatient facility Ref. Prov.: Saralyn Pilar *       Interventional Therapy   Type: Lumbar epidural steroid injection (LESI) (interlaminar)  #11  (since 08/22/2015) (last done on 07/01/2023) Laterality: Right   Level:  L1-2 Level.  Imaging: Fluoroscopic guidance Spinal ([id number]) Anesthesia: Local anesthesia (1-2% Lidocaine) Anxiolysis: None                 Sedation: No Sedation                       DOS: 09/23/2023  Performed by: Oswaldo Done, MD  Purpose: Diagnostic/Therapeutic Indications: Lumbar radicular pain of intraspinal etiology of more than 4 weeks that has failed to respond to conservative therapy and is severe enough to impact quality of life or function. 1. Chronic low back pain (Right) w/ radicular pain (Right)   2. Chronic lower extremity pain (3ry area of Pain) (Right)   3. Chronic hip pain (2ry area of Pain) (Right)   4. Degeneration of intervertebral disc of lumbosacral region with discogenic back pain and lower extremity pain   5. Spinal stenosis of lumbar region with neurogenic claudication   6. Spondylolisthesis of lumbosacral region (L2-3 and L5-S1)   7. T12 compression fracture, sequela   8. Abnormal MRI, lumbar spine (02/02/2020)    NAS-11 Pain  score:   Pre-procedure: 9 /10   Post-procedure: 9 /10      Position / Prep / Materials:  Position: Prone w/ head of the table raised (slight reverse trendelenburg) to facilitate breathing.  Prep solution: ChloraPrep (2% chlorhexidine gluconate and 70% isopropyl alcohol) Prep Area: Entire Posterior Lumbar Region from lower scapular tip down to mid buttocks area and from flank to flank. Materials:  Tray: Epidural tray Needle(s):  Type: Epidural needle (Tuohy) Gauge (G):  17 Length: Regular (3.5-in) Qty: 1   H&P (Pre-op Assessment):  Ms. Dorfman is a 89 y.o. (year old), female patient, seen today for interventional treatment. She  has a past surgical history that includes Hernia repair; Hernia repair; Hemorrhoid surgery; Kyphoplasty (N/A, 01/23/2017); Kyphoplasty (N/A, 01/25/2020); and Cholecystectomy. Ms. Youtz has a current medication list which includes the following prescription(s): acetaminophen, amlodipine, brimonidine, vitamin d3, clonazepam, cranberry, furosemide, latanoprost, levothyroxine, lisinopril, melatonin, metoprolol succinate, omeprazole, tamsulosin, [START ON 10/09/2023] tramadol, and trazodone. Her primarily concern today is the Back Pain  Initial Vital Signs:  Pulse/HCG Rate: (!) 58ECG Heart Rate: (!) 59 Temp: (!) 97.2 F (36.2 C) Resp: 16 BP: (!) 150/73 SpO2: 97 %  BMI: Estimated body mass index is 20.19 kg/m as calculated from the following:   Height as of this encounter: 5\' 3"  (1.6 m).   Weight as of this encounter: 114 lb (51.7 kg).  Risk Assessment: Allergies: Reviewed. She has no known allergies.  Allergy Precautions: None required Coagulopathies: Reviewed. None identified.  Blood-thinner therapy: None at this time Active Infection(s): Reviewed. None identified. Ms. Canizales is afebrile  Site Confirmation: Ms. Boardman was asked to confirm the  procedure and laterality before marking the site Procedure checklist: Completed Consent: Before the  procedure and under the influence of no sedative(s), amnesic(s), or anxiolytics, the patient was informed of the treatment options, risks and possible complications. To fulfill our ethical and legal obligations, as recommended by the American Medical Association's Code of Ethics, I have informed the patient of my clinical impression; the nature and purpose of the treatment or procedure; the risks, benefits, and possible complications of the intervention; the alternatives, including doing nothing; the risk(s) and benefit(s) of the alternative treatment(s) or procedure(s); and the risk(s) and benefit(s) of doing nothing. The patient was provided information about the general risks and possible complications associated with the procedure. These may include, but are not limited to: failure to achieve desired goals, infection, bleeding, organ or nerve damage, allergic reactions, paralysis, and death. In addition, the patient was informed of those risks and complications associated to Spine-related procedures, such as failure to decrease pain; infection (i.e.: Meningitis, epidural or intraspinal abscess); bleeding (i.e.: epidural hematoma, subarachnoid hemorrhage, or any other type of intraspinal or peri-dural bleeding); organ or nerve damage (i.e.: Any type of peripheral nerve, nerve root, or spinal cord injury) with subsequent damage to sensory, motor, and/or autonomic systems, resulting in permanent pain, numbness, and/or weakness of one or several areas of the body; allergic reactions; (i.e.: anaphylactic reaction); and/or death. Furthermore, the patient was informed of those risks and complications associated with the medications. These include, but are not limited to: allergic reactions (i.e.: anaphylactic or anaphylactoid reaction(s)); adrenal axis suppression; blood sugar elevation that in diabetics may result in ketoacidosis or comma; water retention that in patients with history of congestive heart failure  may result in shortness of breath, pulmonary edema, and decompensation with resultant heart failure; weight gain; swelling or edema; medication-induced neural toxicity; particulate matter embolism and blood vessel occlusion with resultant organ, and/or nervous system infarction; and/or aseptic necrosis of one or more joints. Finally, the patient was informed that Medicine is not an exact science; therefore, there is also the possibility of unforeseen or unpredictable risks and/or possible complications that may result in a catastrophic outcome. The patient indicated having understood very clearly. We have given the patient no guarantees and we have made no promises. Enough time was given to the patient to ask questions, all of which were answered to the patient's satisfaction. Ms. Sandora has indicated that she wanted to continue with the procedure. Attestation: I, the ordering provider, attest that I have discussed with the patient the benefits, risks, side-effects, alternatives, likelihood of achieving goals, and potential problems during recovery for the procedure that I have provided informed consent. Date  Time: 09/23/2023 11:35 AM  Pharmacotherapy Assessment  Analgesic: Tramadol 50 mg, 1 tab PO q 8 hrs (150 mg/day of tramadol) MME/day: 15 mg/day.  RTCB: 04/06/2024  Monitoring: [place] PMP: PDMP not reviewed this encounter.       Pharmacotherapy: No side-effects or adverse reactions reported. Compliance: No problems identified. Effectiveness: Clinically acceptable.  Valerie Salts, RN  09/23/2023 11:38 AM  Sign when Signing Visit Safety precautions to be maintained throughout the outpatient stay will include: orient to surroundings, keep bed in low position, maintain call bell within reach at all times, provide assistance with transfer out of bed and ambulation.    No results found for: "CBDTHCR" No results found for: "D8THCCBX" No results found for: "D9THCCBX"  UDS:  Summary  Date Value Ref Range  Status  04/07/2023 Note  Final    Comment:    ====================================================================  [employer] Select 13 (MW) ==================================================================== Test                             Result       Flag       Units  Drug Present and Declared for Prescription Verification   7-aminoclonazepam              255          EXPECTED   ng/mg creat    7-aminoclonazepam is an expected metabolite of clonazepam. Source of    clonazepam is a scheduled prescription medication.    Tramadol                       >25000       EXPECTED   ng/mg creat   O-Desmethyltramadol            >25000       EXPECTED   ng/mg creat   N-Desmethyltramadol            7320         EXPECTED   ng/mg creat    Source of tramadol is a prescription medication. O-desmethyltramadol    and N-desmethyltramadol are expected metabolites of tramadol.  ==================================================================== Test                      Result    Flag   Units      Ref Range   Creatinine              20               mg/dL      >=46 ==================================================================== Declared Medications:  The flagging and interpretation on this report are based on the  following declared medications.  Unexpected results may arise from  inaccuracies in the declared medications.   **Note: The testing scope of this panel includes these medications:   Clonazepam (Klonopin)  Tramadol (Ultram)   **Note: The testing scope of this panel does not include the  following reported medications:   Acetaminophen (Tylenol)  Amlodipine (Norvasc)  Cranberry  Eye Drops  Furosemide (Lasix)  Levothyroxine (Synthroid)  Lisinopril (Zestril)  Melatonin  Methocarbamol (Robaxin)  Metoprolol (Toprol)  Naloxone (Narcan)  Omeprazole (Prilosec)  Tamsulosin (Flomax)  Trazodone (Desyrel)  Vitamin  D3 ==================================================================== For clinical consultation, please call [phone]. ====================================================================       Pre-Procedure Preparation:  Monitoring: As per clinic protocol. Respiration, ETCO2, SpO2, BP, heart rate and rhythm monitor placed and checked for adequate function Safety Precautions: Patient was assessed for positional comfort and pressure points before starting the procedure. Time-out: I initiated and conducted the "Time-out" before starting the procedure, as per protocol. The patient was asked to participate by confirming the accuracy of the "Time Out" information. Verification of the correct person, site, and procedure were performed and confirmed by me, the nursing staff, and the patient. "Time-out" conducted as per Joint Commission's Universal Protocol (UP.01.01.01). Time: 1150 Start Time: 1150 hrs.  Description/Narrative of Procedure:          Target: Epidural space via interlaminar opening, initially targeting the lower laminar border of the superior vertebral body. Region: Lumbar Approach: Percutaneous paravertebral  Rationale (medical necessity): procedure needed and proper for the diagnosis and/or treatment of the patient's medical symptoms and needs. Procedural Technique Safety Precautions: Aspiration looking for blood return was conducted prior to all injections. At no  point did we inject any substances, as a needle was being advanced. No attempts were made at seeking any paresthesias. Safe injection practices and needle disposal techniques used. Medications properly checked for expiration dates. SDV (single dose vial) medications used. Description of the Procedure: Protocol guidelines were followed. The procedure needle was introduced through the skin, ipsilateral to the reported pain, and advanced to the target area. Bone was contacted and the needle walked caudad, until the  lamina was cleared. The epidural space was identified using "loss-of-resistance technique" with 2-3 ml of PF-NaCl (0.9% NSS), in a 5cc LOR glass syringe.  Vitals:   09/23/23 1134 09/23/23 1150 09/23/23 1155  BP: (!) 150/73 (!) 177/81 (!) 172/83  Pulse: (!) 58    Resp: 16 18 18   Temp: (!) 97.2 F (36.2 C)    SpO2: 97% 97% 95%  Weight: 114 lb (51.7 kg)    Height: 5\' 3"  (1.6 m)      Start Time: 1150 hrs. End Time: 1155 hrs.  Imaging Guidance (Spinal):          Type of Imaging Technique: Fluoroscopy Guidance (Spinal) Indication(s): Fluoroscopy guidance for needle placement to enhance accuracy in procedures requiring precise needle localization for targeted delivery of medication in or near specific anatomical locations not easily accessible without such real-time imaging assistance. Exposure Time: Please see nurses notes. Contrast: Before injecting any contrast, we confirmed that the patient did not have an allergy to iodine, shellfish, or radiological contrast. Once satisfactory needle placement was completed at the desired level, radiological contrast was injected. Contrast injected under live fluoroscopy. No contrast complications. See chart for type and volume of contrast used. Fluoroscopic Guidance: I was personally present during the use of fluoroscopy. "Tunnel Vision Technique" used to obtain the best possible view of the target area. Parallax error corrected before commencing the procedure. "Direction-depth-direction" technique used to introduce the needle under continuous pulsed fluoroscopy. Once target was reached, antero-posterior, oblique, and lateral fluoroscopic projection used confirm needle placement in all planes. Images permanently stored in EMR. Interpretation: I personally interpreted the imaging intraoperatively. Adequate needle placement confirmed in multiple planes. Appropriate spread of contrast into desired area was observed. No evidence of afferent or efferent  intravascular uptake. No intrathecal or subarachnoid spread observed. Permanent images saved into the patient's record.  Antibiotic Prophylaxis:   Anti-infectives (From admission, onward)    None      Indication(s): None identified  Post-operative Assessment:  Post-procedure Vital Signs:  Pulse/HCG Rate: (!) 5860 Temp: (!) 97.2 F (36.2 C) Resp: 18 BP: (!) 172/83 SpO2: 95 %  EBL: None  Complications: No immediate post-treatment complications observed by team, or reported by patient.  Note: The patient tolerated the entire procedure well. A repeat set of vitals were taken after the procedure and the patient was kept under observation following institutional policy, for this type of procedure. Post-procedural neurological assessment was performed, showing return to baseline, prior to discharge. The patient was provided with post-procedure discharge instructions, including a section on how to identify potential problems. Should any problems arise concerning this procedure, the patient was given instructions to immediately contact us, at any time, without hesitation. In any case, we plan to contact the patient by telephone for a follow-up status report regarding this interventional procedure.  Comments:  No additional relevant information.  Plan of Care (POC)  Orders:  Orders Placed This Encounter  Procedures   Lumbar Epidural Injection    Scheduling Instructions:     Procedure: Interlaminar LESI L1-2  Laterality: Right     Sedation: Patient's choice     Timeframe: Today    Where will this procedure be performed?:   [facility]   [facility] C-ARM 1-60 MIN NO REPORT    Intraoperative interpretation by procedural physician at [facility].    Standing Status:   Standing    Number of Occurrences:   1    Reason for exam::   Assistance in needle guidance and placement for procedures requiring needle placement in or near specific anatomical locations not  easily accessible without such assistance.   Informed Consent Details: Physician/Practitioner Attestation; Transcribe to consent form and obtain patient signature    Note: Always confirm laterality of pain with Ms. Reimers, before procedure. Transcribe to consent form and obtain patient signature.    Physician/Practitioner attestation of informed consent for procedure/surgical case:   I, the physician/practitioner, attest that I have discussed with the patient the benefits, risks, side effects, alternatives, likelihood of achieving goals and potential problems during recovery for the procedure that I have provided informed consent.    Procedure:   Lumbar epidural steroid injection under fluoroscopic guidance    Physician/Practitioner performing the procedure:   Kipp Shank A. Laban Emperor, MD    Indication/Reason:   Low back and/or lower extremity pain secondary to lumbar radiculitis   Provide equipment / supplies at bedside    Procedural tray: Epidural Tray (Disposable  single use) Skin infiltration needle: Regular 1.5-in, 25-G, (x1) Block needle size: Regular standard Catheter: No catheter required    Standing Status:   Standing    Number of Occurrences:   1    Specify:   Epidural Tray   Chronic Opioid Analgesic:   Tramadol 50 mg, 1 tab PO q 8 hrs (150 mg/day of tramadol) MME/day: 15 mg/day.   Medications ordered for procedure: Meds ordered this encounter  Medications   iohexol (OMNIPAQUE) 180 MG/ML injection 10 mL    Must be Myelogram-compatible. If not available, you may substitute with a water-soluble, non-ionic, hypoallergenic, myelogram-compatible radiological contrast medium.   lidocaine (XYLOCAINE) 2 % (with pres) injection 400 mg   pentafluoroprop-tetrafluoroeth (GEBAUERS) aerosol   sodium chloride flush (NS) 0.9 % injection 2 mL   ropivacaine (PF) 2 mg/mL (0.2%) (NAROPIN) injection 2 mL   triamcinolone acetonide (KENALOG-40) injection 40 mg   traMADol (ULTRAM) 50 MG tablet     Sig: Take 1 tablet (50 mg total) by mouth 3 (three) times daily. Each refill must last 30 days.    Dispense:  90 tablet    Refill:  5    DO NOT: delete (not duplicate); no partial-fill (will deny script to complete), no renewal request (F/U required). DISPENSE: 1 day early if closed on fill date. WARN: No CNS-depressants within 8 hrs of med.   Medications administered: We administered iohexol, lidocaine, pentafluoroprop-tetrafluoroeth, sodium chloride flush, ropivacaine (PF) 2 mg/mL (0.2%), and triamcinolone acetonide.  See the medical record for exact dosing, route, and time of administration.  Follow-up plan:   Return in about 2 weeks (around 10/07/2023) for (VV), (PPE).       Interventional Therapies  Risk  Complexity Considerations:   Advanced age  HOH  AAA  HTN  Stage3 CKD  GERD   Note: Hard of hearing   Planned  Pending:   (PRN) palliative right L1-2 LESI #11    Under consideration:   (PRN) palliative right L1-2 LESI #10 (07/01/2023)    Completed:   Palliative right L1-2 LESI x9 (01/21/2023) (100/100/80/80)  Therapeutic right L2-3 LESI x1 (03/07/2020) (100/100/25/<50)  Diagnostic/therapeutic bilateral SI joint injection x1 (02/10/2020) (100/100/100 x2 days/0)  Palliative right lumbar facet MBB x13 (01/04/2020) (8 to 0) (100/100/100)  Palliative left lumbar facet MBB x11 (01/04/2020) (8 to 0) (100/100/100)    Therapeutic  Palliative (PRN) options:   Palliative right T12-L1 LESI (PRN)   Pharmacotherapy  Nonopioids transferred 04/11/2020: Robaxin      Recent Visits Date Type Provider Dept  07/17/23 Office Visit Delano Metz, MD [facility]  07/01/23 Procedure visit Delano Metz, MD [facility]  Showing recent visits within past 90 days and meeting all other requirements Today's Visits Date Type Provider Dept  09/23/23 Procedure visit Delano Metz, MD [facility]  Showing today's visits and meeting all other  requirements Future Appointments Date Type Provider Dept  10/09/23 Appointment Delano Metz, MD [facility]  Showing future appointments within next 90 days and meeting all other requirements  Disposition: Discharge home  Discharge (Date  Time): 09/23/2023; 1205 hrs.   Primary Care Physician: Smitty Cords, DO Location: [facility] Note by: Oswaldo Done, MD (TTS technology used. I apologize for any typographical errors that were not detected and corrected.) Date: 09/23/2023; Time: 12:42 PM  Disclaimer:  Medicine is not an Visual merchandiser. The only guarantee in medicine is that nothing is guaranteed. It is important to note that the decision to proceed with this intervention was based on the information collected from the patient. The Data and conclusions were drawn from the patient's questionnaire, the interview, and the physical examination. Because the information was provided in large part by the patient, it cannot be guaranteed that it has not been purposely or unconsciously manipulated. Every effort has been made to obtain as much relevant data as possible for this evaluation. It is important to note that the conclusions that lead to this procedure are derived in large part from the available data. Always take into account that the treatment will also be dependent on availability of resources and existing treatment guidelines, considered by other Pain Management Practitioners as being common knowledge and practice, at the time of the intervention. For Medico-Legal purposes, it is also important to point out that variation in procedural techniques and pharmacological choices are the acceptable norm. The indications, contraindications, technique, and results of the above procedure should only be interpreted and judged by a Board-Certified Interventional Pain Specialist with extensive familiarity and expertise in the same exact procedure  and technique.

## 2023-09-23 NOTE — Progress Notes (Signed)
 Safety precautions to be maintained throughout the outpatient stay will include: orient to surroundings, keep bed in low position, maintain call bell within reach at all times, provide assistance with transfer out of bed and ambulation.

## 2023-09-23 NOTE — Patient Instructions (Addendum)
 Epidural Steroid Injection Patient Information  Description: The epidural space surrounds the nerves as they exit the spinal cord.  In some patients, the nerves can be compressed and inflamed by a bulging disc or a tight spinal canal (spinal stenosis).  By injecting steroids into the epidural space, we can bring irritated nerves into direct contact with a potentially helpful medication.  These steroids act directly on the irritated nerves and can reduce swelling and inflammation which often leads to decreased pain.  Epidural steroids may be injected anywhere along the spine and from the neck to the low back depending upon the location of your pain.   After numbing the skin with local anesthetic (like Novocaine), a small needle is passed into the epidural space slowly.  You may experience a sensation of pressure while this is being done.  The entire block usually last less than 10 minutes.  Conditions which may be treated by epidural steroids:  Low back and leg pain Neck and arm pain Spinal stenosis Post-laminectomy syndrome Herpes zoster (shingles) pain Pain from compression fractures  Preparation for the injection:  Do not eat any solid food or dairy products within 8 hours of your appointment.  You may drink clear liquids up to 3 hours before appointment.  Clear liquids include water, black coffee, juice or soda.  No milk or cream please. You may take your regular medication, including pain medications, with a sip of water before your appointment  Diabetics should hold regular insulin (if taken separately) and take 1/2 normal NPH dos the morning of the procedure.  Carry some sugar containing items with you to your appointment. A driver must accompany you and be prepared to drive you home after your procedure.  Bring all your current medications with your. An IV may be inserted and sedation may be given at the discretion of the physician.   A blood pressure cuff, EKG and other monitors will  often be applied during the procedure.  Some patients may need to have extra oxygen administered for a short period. You will be asked to provide medical information, including your allergies, prior to the procedure.  We must know immediately if you are taking blood thinners (like Coumadin/Warfarin)  Or if you are allergic to IV iodine contrast (dye). We must know if you could possible be pregnant.  Possible side-effects: Bleeding from needle site Infection (rare, may require surgery) Nerve injury (rare) Numbness & tingling (temporary) Difficulty urinating (rare, temporary) Spinal headache ( a headache worse with upright posture) Light -headedness (temporary) Pain at injection site (several days) Decreased blood pressure (temporary) Weakness in arm/leg (temporary) Pressure sensation in back/neck (temporary)  Call if you experience: Fever/chills associated with headache or increased back/neck pain. Headache worsened by an upright position. New onset weakness or numbness of an extremity below the injection site Hives or difficulty breathing (go to the emergency room) Inflammation or drainage at the infection site Severe back/neck pain Any new symptoms which are concerning to you  Please note:  Although the local anesthetic injected can often make your back or neck feel good for several hours after the injection, the pain will likely return.  It takes 3-7 days for steroids to work in the epidural space.  You may not notice any pain relief for at least that one week.  If effective, we will often do a series of three injections spaced 3-6 weeks apart to maximally decrease your pain.  After the initial series, we generally will wait several months before  considering a repeat injection of the same type.  If you have any questions, please call [phone] [facility] ______________________________________________________________________    Post-Procedure  Discharge Instructions  Instructions: Apply ice:  Purpose: This will minimize any swelling and discomfort after procedure.  When: Day of procedure, as soon as you get home. How: Fill a plastic sandwich bag with crushed ice. Cover it with a small towel and apply to injection site. How long: (15 min on, 15 min off) Apply for 15 minutes then remove x 15 minutes.  Repeat sequence on day of procedure, until you go to bed. Apply heat:  Purpose: To treat any soreness and discomfort from the procedure. When: Starting the next day after the procedure. How: Apply heat to procedure site starting the day following the procedure. How long: May continue to repeat daily, until discomfort goes away. Food intake: Start with clear liquids (like water) and advance to regular food, as tolerated.  Physical activities: Keep activities to a minimum for the first 8 hours after the procedure. After that, then as tolerated. Driving: If you have received any sedation, be responsible and do not drive. You are not allowed to drive for 24 hours after having sedation. Blood thinner: (Applies only to those taking blood thinners) You may restart your blood thinner 6 hours after your procedure. Insulin: (Applies only to Diabetic patients taking insulin) As soon as you can eat, you may resume your normal dosing schedule. Infection prevention: Keep procedure site clean and dry. Shower daily and clean area with soap and water. Post-procedure Pain Diary: Extremely important that this be done correctly and accurately. Recorded information will be used to determine the next step in treatment. For the purpose of accuracy, follow these rules: Evaluate only the area treated. Do not report or include pain from an untreated area. For the purpose of this evaluation, ignore all other areas of pain, except for the treated area. After your procedure, avoid taking a long nap and attempting to complete the pain diary after you wake up. Instead,  set your alarm clock to go off every hour, on the hour, for the initial 8 hours after the procedure. Document the duration of the numbing medicine, and the relief you are getting from it. Do not go to sleep and attempt to complete it later. It will not be accurate. If you received sedation, it is likely that you were given a medication that may cause amnesia. Because of this, completing the diary at a later time may cause the information to be inaccurate. This information is needed to plan your care. Follow-up appointment: Keep your post-procedure follow-up evaluation appointment after the procedure (usually 2 weeks for most procedures, 6 weeks for radiofrequencies). DO NOT FORGET to bring you pain diary with you.   Expect: (What should I expect to see with my procedure?) From numbing medicine (AKA: Local Anesthetics): Numbness or decrease in pain. You may also experience some weakness, which if present, could last for the duration of the local anesthetic. Onset: Full effect within 15 minutes of injected. Duration: It will depend on the type of local anesthetic used. On the average, 1 to 8 hours.  From steroids (Applies only if steroids were used): Decrease in swelling or inflammation. Once inflammation is improved, relief of the pain will follow. Onset of benefits: Depends on the amount of swelling present. The more swelling, the longer it will take for the benefits to be seen. In some cases, up to 10  days. Duration: Steroids will stay in the system x 2 weeks. Duration of benefits will depend on multiple posibilities including persistent irritating factors. Side-effects: If present, they may typically last 2 weeks (the duration of the steroids). Frequent: Cramps (if they occur, drink Gatorade and take over-the-counter Magnesium 450-500 mg once to twice a day); water retention with temporary weight gain; increases in blood sugar; decreased immune system response; increased appetite. Occasional: Facial  flushing (red, warm cheeks); mood swings; menstrual changes. Uncommon: Long-term decrease or suppression of natural hormones; bone thinning. (These are more common with higher doses or more frequent use. This is why we prefer that our patients avoid having any injection therapies in other practices.)  Very Rare: Severe mood changes; psychosis; aseptic necrosis. From procedure: Some discomfort is to be expected once the numbing medicine wears off. This should be minimal if ice and heat are applied as instructed.  Call if: (When should I call?) You experience numbness and weakness that gets worse with time, as opposed to wearing off. New onset bowel or bladder incontinence. (Applies only to procedures done in the spine)  Emergency Numbers: Durning business hours (Monday - Thursday, 8:00 AM - 4:00 PM) (Friday, 9:00 AM - 12:00 Noon): [phone] After hours: [phone] NOTE: If you are having a problem and are unable connect with, or to talk to a provider, then go to your nearest urgent care or emergency department. If the problem is serious and urgent, please call 911. ______________________________________________________________________     ______________________________________________________________________    Opioid Pain Medication Update  To: All patients taking opioid pain medications. (I.e.: hydrocodone, hydromorphone, oxycodone, oxymorphone, morphine, codeine, methadone, tapentadol, tramadol, buprenorphine, fentanyl, etc.)  Re: Updated review of side effects and adverse reactions of opioid analgesics, as well as new information about long term effects of this class of medications.  Direct risks of long-term opioid therapy are not limited to opioid addiction and overdose. Potential medical risks include serious fractures, breathing problems during sleep, hyperalgesia, immunosuppression, chronic constipation, bowel obstruction, myocardial infarction, and tooth decay secondary to  xerostomia.  Unpredictable adverse effects that can occur even if you take your medication correctly: Cognitive impairment, respiratory depression, and death. Most people think that if they take their medication "correctly", and "as instructed", that they will be safe. Nothing could be farther from the truth. In reality, a significant amount of recorded deaths associated with the use of opioids has occurred in individuals that had taken the medication for a long time, and were taking their medication correctly. The following are examples of how this can happen: Patient taking his/her medication for a long time, as instructed, without any side effects, is given a certain antibiotic or another unrelated medication, which in turn triggers a "Drug-to-drug interaction" leading to disorientation, cognitive impairment, impaired reflexes, respiratory depression or an untoward event leading to serious bodily harm or injury, including death.  Patient taking his/her medication for a long time, as instructed, without any side effects, develops an acute impairment of liver and/or kidney function. This will lead to a rapid inability of the body to breakdown and eliminate their pain medication, which will result in effects similar to an "overdose", but with the same medicine and dose that they had always taken. This again may lead to disorientation, cognitive impairment, impaired reflexes, respiratory depression or an untoward event leading to serious bodily harm or injury, including death.  A similar problem will occur with patients as they grow older and their liver and kidney function begins  to decrease as part of the aging process.  Background information: Historically, the original case for using long-term opioid therapy to treat chronic noncancer pain was based on safety assumptions that subsequent experience has called into question. In 1996, the American Pain Society and the American Academy of Pain Medicine  issued a consensus statement supporting long-term opioid therapy. This statement acknowledged the dangers of opioid prescribing but concluded that the risk for addiction was low; respiratory depression induced by opioids was short-lived, occurred mainly in opioid-naive patients, and was antagonized by pain; tolerance was not a common problem; and efforts to control diversion should not constrain opioid prescribing. This has now proven to be wrong. Experience regarding the risks for opioid addiction, misuse, and overdose in community practice has failed to support these assumptions.  According to the Centers for Disease Control and Prevention, fatal overdoses involving opioid analgesics have increased sharply over the past decade. Currently, more than 96,700 people die from drug overdoses every year. Opioids are a factor in 7 out of every 10 overdose deaths. Deaths from drug overdose have surpassed motor vehicle accidents as the leading cause of death for individuals between the ages of 59 and 78.  Clinical data suggest that neuroendocrine dysfunction may be very common in both men and women, potentially causing hypogonadism, erectile dysfunction, infertility, decreased libido, osteoporosis, and depression. Recent studies linked higher opioid dose to increased opioid-related mortality. Controlled observational studies reported that long-term opioid therapy may be associated with increased risk for cardiovascular events. Subsequent meta-analysis concluded that the safety of long-term opioid therapy in elderly patients has not been proven.   Side Effects and adverse reactions: Common side effects: Drowsiness (sedation). Dizziness. Nausea and vomiting. Constipation. Physical dependence -- Dependence often manifests with withdrawal symptoms when opioids are discontinued or decreased. Tolerance -- As you take repeated doses of opioids, you require increased medication to experience the same effect of pain  relief. Respiratory depression -- This can occur in healthy people, especially with higher doses. However, people with COPD, asthma or other lung conditions may be even more susceptible to fatal respiratory impairment.  Uncommon side effects: An increased sensitivity to feeling pain and extreme response to pain (hyperalgesia). Chronic use of opioids can lead to this. Delayed gastric emptying (the process by which the contents of your stomach are moved into your small intestine). Muscle rigidity. Immune system and hormonal dysfunction. Quick, involuntary muscle jerks (myoclonus). Arrhythmia. Itchy skin (pruritus). Dry mouth (xerostomia).  Long-term side effects: Chronic constipation. Sleep-disordered breathing (SDB). Increased risk of bone fractures. Hypothalamic-pituitary-adrenal dysregulation. Increased risk of overdose.  RISKS: Respiratory depression and death: Opioids increase the risk of respiratory depression and death.  Drug-to-drug interactions: Opioids are relatively contraindicated in combination with benzodiazepines, sleep inducers, and other central nervous system depressants. Other classes of medications (i.e.: certain antibiotics and even over-the-counter medications) may also trigger or induce respiratory depression in some patients.  Medical conditions: Patients with pre-existing respiratory problems are at higher risk of respiratory failure and/or depression when in combination with opioid analgesics. Opioids are relatively contraindicated in some medical conditions such as central sleep apnea.   Fractures and Falls:  Opioids increase the risk and incidence of falls. This is of particular importance in elderly patients.  Endocrine System:  Long-term administration is associated with endocrine abnormalities (endocrinopathies). (Also known as Opioid-induced Endocrinopathy) Influences on both the hypothalamic-pituitary-adrenal axis?and the hypothalamic-pituitary-gonadal  axis have been demonstrated with consequent hypogonadism and adrenal insufficiency in both sexes. Hypogonadism and decreased levels of dehydroepiandrosterone sulfate  have been reported in men and women. Endocrine effects include: Amenorrhoea in women (abnormal absence of menstruation) Reduced libido in both sexes Decreased sexual function Erectile dysfunction in men Hypogonadisms (decreased testicular function with shrinkage of testicles) Infertility Depression and fatigue Loss of muscle mass Anxiety Depression Immune suppression Hyperalgesia Weight gain Anemia Osteoporosis Patients (particularly women of childbearing age) should avoid opioids. There is insufficient evidence to recommend routine monitoring of asymptomatic patients taking opioids in the long-term for hormonal deficiencies.  Immune System: Human studies have demonstrated that opioids have an immunomodulating effect. These effects are mediated via opioid receptors both on immune effector cells and in the central nervous system. Opioids have been demonstrated to have adverse effects on antimicrobial response and anti-tumour surveillance. Buprenorphine has been demonstrated to have no impact on immune function.  Opioid Induced Hyperalgesia: Human studies have demonstrated that prolonged use of opioids can lead to a state of abnormal pain sensitivity, sometimes called opioid induced hyperalgesia (OIH). Opioid induced hyperalgesia is not usually seen in the absence of tolerance to opioid analgesia. Clinically, hyperalgesia may be diagnosed if the patient on long-term opioid therapy presents with increased pain. This might be qualitatively and anatomically distinct from pain related to disease progression or to breakthrough pain resulting from development of opioid tolerance. Pain associated with hyperalgesia tends to be more diffuse than the pre-existing pain and less defined in quality. Management of opioid induced  hyperalgesia requires opioid dose reduction.  Cancer: Chronic opioid therapy has been associated with an increased risk of cancer among noncancer patients with chronic pain. This association was more evident in chronic strong opioid users. Chronic opioid consumption causes significant pathological changes in the small intestine and colon. Epidemiological studies have found that there is a link between opium dependence and initiation of gastrointestinal cancers. Cancer is the second leading cause of death after cardiovascular disease. Chronic use of opioids can cause multiple conditions such as GERD, immunosuppression and renal damage as well as carcinogenic effects, which are associated with the incidence of cancers.   Mortality: Long-term opioid use has been associated with increased mortality among patients with chronic non-cancer pain (CNCP).  Prescription of long-acting opioids for chronic noncancer pain was associated with a significantly increased risk of all-cause mortality, including deaths from causes other than overdose.  Reference: Von Korff M, Kolodny A, Deyo RA, Chou R. Long-term opioid therapy reconsidered. Ann Intern Med. 2011 Sep 6;155(5):325-8. doi: 10.7326/[id number]. PMID: 16109604; PMCID: [id number]. Randon Goldsmith, Hayward RA, Dunn KM, [place] KP. Risk of adverse events in patients prescribed long-term opioids: A cohort study in the [place] [facility] [employer]. Eur J Pain. 2019 May;23(5):908-922. doi: 10.1002/ejp.1357. Epub 2019 Jan 31. PMID: 78295621. Colameco S, Coren JS, [place]o CA. Continuous opioid treatment for chronic noncancer pain: a time for moderation in prescribing. Postgrad Med. 2009 Jul;121(4):61-6. doi: 10.3810/pgm.2009.07.2032. PMID: 30865784. William Hamburger RN, [place], Blazina I, Cristopher [place], Bougatsos C, Deyo RA. The effectiveness and risks of long-term opioid therapy for chronic pain: a systematic  review for a [employer] to [employer]. Ann Intern Med. 2015 Feb 17;162(4):276-86. doi: 10.7326/M14-2559. PMID: 69629528. Caryl Bis [facility], [place] DM. NCHS Data Brief No. 22. [place]: Centers for Disease Control and Prevention; 2009. Sep, Increase in Fatal Poisonings Involving Opioid Analgesics in the [place], 1999-2006. Song IA, Choi HR, Oh TK. Long-term opioid use and mortality in patients with chronic non-cancer pain: Ten-year follow-up study in [place] from 2010  through 2019. EClinicalMedicine. 2022 Jul 18;51:101558. doi: 10.1016/j.eclinm.2022.629528. PMID: 41324401; PMCID: [id number]. Huser, W., Schubert, T., Vogelmann, T. et al. All-cause mortality in patients with long-term opioid therapy compared with non-opioid analgesics for chronic non-cancer pain: a database study. BMC Med 18, 162 (2020). http://lester.info/ Rashidian H, Karie Kirks, Malekzadeh R, Haghdoost AA. An Ecological Study of the Association between Opiate Use and Incidence of Cancers. Addict Health. 2016 Fall;8(4):252-260. PMID: 03474259; PMCID: [id number].  Our Goal: Our goal is to control your pain with means other than the use of opioid pain medications.  Our Recommendation: Talk to your physician about coming off of these medications. We can assist you with the tapering down and stopping these medicines. Based on the new information, even if you cannot completely stop the medication, a decrease in the dose may be associated with a lesser risk. Ask for other means of controlling the pain. Decrease or eliminate those factors that significantly contribute to your pain such as smoking, obesity, and a diet heavily tilted towards "inflammatory" nutrients.  Last Updated: 02/03/2023   ______________________________________________________________________       ______________________________________________________________________    National Pain  Medication Shortage  The [place] is experiencing worsening drug shortages. These have had a negative widespread effect on patient care and treatment. Not expected to improve any time soon. Predicted to last past 2029.   Drug shortage list (generic names) Oxycodone IR Oxycodone/APAP Oxymorphone IR Hydromorphone Hydrocodone/APAP Morphine  Where is the problem?  Manufacturing and supply level.  Will this shortage affect you?  Only if you take any of the above pain medications.  How? You may be unable to fill your prescription.  Your pharmacist may offer a "partial fill" of your prescription. (Warning: Do not accept partial fills.) Prescriptions partially filled cannot be transferred to another pharmacy. Read our Medication Rules and Regulation. Depending on how much medicine you are dependent on, you may experience withdrawals when unable to get the medication.  Recommendations: Consider ending your dependence on opioid pain medications. Ask your pain specialist to assist you with the process. Consider switching to a medication currently not in shortage, such as Buprenorphine. Talk to your pain specialist about this option. Consider decreasing your pain medication requirements by managing tolerance thru "Drug Holidays". This may help minimize withdrawals, should you run out of medicine. Control your pain thru the use of non-pharmacological interventional therapies.   Your prescriber: Prescribers cannot be blamed for shortages. Medication manufacturing and supply issues cannot be fixed by the prescriber.   NOTE: The prescriber is not responsible for supplying the medication, or solving supply issues. Work with your pharmacist to solve it. The patient is responsible for the decision to take or continue taking the medication and for identifying and securing a legal supply source. By law, supplying the medication is the job and responsibility of the pharmacy. The prescriber is responsible for  the evaluation, monitoring, and prescribing of these medications.   Prescribers will NOT: Re-issue prescriptions that have been partially filled. Re-issue prescriptions already sent to a pharmacy.  Re-send prescriptions to a different pharmacy because yours did not have your medication. Ask pharmacist to order more medicine or transfer the prescription to another pharmacy. (Read below.)  New 2023 regulation: "March 29, 2022 Revised Regulation Allows DEA-Registered Pharmacies to Transfer Electronic Prescriptions at a Patient's Request DEA Headquarters Division - Public Information Office Patients now have the ability to request their electronic prescription be transferred to another pharmacy without having to go back to their practitioner to  initiate the request. This revised regulation went into effect on Monday, March 25, 2022.     At a patient's request, a DEA-registered retail pharmacy can now transfer an electronic prescription for a controlled substance (schedules II-V) to another DEA-registered retail pharmacy. Prior to this change, patients would have to go through their practitioner to cancel their prescription and have it re-issued to a different pharmacy. The process was taxing and time consuming for both patients and practitioners.    The Drug Enforcement Administration [facility]) published its intent to revise the process for transferring electronic prescriptions on June 16, 2020.  The final rule was published in the federal register on February 21, 2022 and went into effect 30 days later.  Under the final rule, a prescription can only be transferred once between pharmacies, and only if allowed under existing state or other applicable law. The prescription must remain in its electronic form; may not be altered in any way; and the transfer must be communicated directly between two licensed pharmacists. It's important to note, any authorized refills transfer with the original prescription,  which means the entire prescription will be filled at the same pharmacy".  Reference: [email or web address] [facility] website announcement)  [email or web address] [employer])   [employer] / Vol. 88, No. 143 / Thursday, February 21, 2022 / Rules and Regulations DEPARTMENT OF JUSTICE  Drug Enforcement Administration  21 CFR Part 1306  [Docket No. [id number]]  RIN [id number] Transfer of Electronic Prescriptions for Schedules II-V Controlled Substances Between Pharmacies for Initial Filling  ______________________________________________________________________       ______________________________________________________________________    Transfer of Pain Medication between Pharmacies  Re: 2023 DEA Clarification on existing regulation  Published on DEA Website: March 29, 2022  Title: Revised Regulation Allows DEA-Registered Pharmacies to Electrical engineer Prescriptions at a Patient's Request DEA Headquarters Division - [employer]  "Patients now have the ability to request their electronic prescription be transferred to another pharmacy without having to go back to their practitioner to initiate the request. This revised regulation went into effect on Monday, March 25, 2022.     At a patient's request, a DEA-registered retail pharmacy can now transfer an electronic prescription for a controlled substance (schedules II-V) to another DEA-registered retail pharmacy. Prior to this change, patients would have to go through their practitioner to cancel their prescription and have it re-issued to a different pharmacy. The process was taxing and time consuming for both patients and practitioners.    The Drug Enforcement Administration [facility]) published its intent to revise the process for transferring electronic  prescriptions on June 16, 2020.  The final rule was published in the federal register on February 21, 2022 and went into effect 30 days later.  Under the final rule, a prescription can only be transferred once between pharmacies, and only if allowed under existing state or other applicable law. The prescription must remain in its electronic form; may not be altered in any way; and the transfer must be communicated directly between two licensed pharmacists. It's important to note, any authorized refills transfer with the original prescription, which means the entire prescription will be filled at the same pharmacy."    REFERENCES: 1. DEA website announcement [email or web address]  2. Department of Justice website  [email or web address]  3. DEPARTMENT OF JUSTICE Drug Enforcement Administration 21 CFR Part 1306 [Docket No. [id number]] RIN [id number] "Transfer of Electronic Prescriptions for Schedules II-V Controlled Substances Between Pharmacies for Initial Filling"  ______________________________________________________________________  ______________________________________________________________________    Medication Rules  Purpose: To inform patients, and their family members, of our medication rules and regulations.  Applies to: All patients receiving prescriptions from our practice (written or electronic).  Pharmacy of record: This is the pharmacy where your electronic prescriptions will be sent. Make sure we have the correct one.  Electronic prescriptions: In compliance with the [facility] Strengthen Opioid Misuse Prevention (STOP) Act of 2017 (Session Conni Elliot [phone]), effective July 29, 2018, all controlled substances must be electronically prescribed. Written prescriptions, faxing, or calling prescriptions to a pharmacy will no longer  be done.  Prescription refills: These will be provided only during in-person appointments. No medications will be renewed without a "face-to-face" evaluation with your provider. Applies to all prescriptions.  NOTE: The following applies primarily to controlled substances (Opioid* Pain Medications).   Type of encounter (visit): For patients receiving controlled substances, face-to-face visits are required. (Not an option and not up to the patient.)  Patient's Responsibilities: Pain Pills: Bring all pain pills to every appointment (except for procedure appointments). Pill counts are required.  Pill Bottles: Bring pills in original pharmacy bottle. Bring bottle, even if empty. Always bring the bottle of the most recent fill.  Medication refills: You are responsible for knowing and keeping track of what medications you are taking and when is it that you will need a refill. The day before your appointment: write a list of all prescriptions that need to be refilled. The day of the appointment: give the list to the admitting nurse. Prescriptions will be written only during appointments. No prescriptions will be written on procedure days. If you forget a medication: it will not be "Called in", "Faxed", or "electronically sent". You will need to get another appointment to get these prescribed. No early refills. Do not call asking to have your prescription filled early. Partial  or short prescriptions: Occasionally your pharmacy may not have enough pills to fill your prescription.  NEVER ACCEPT a partial fill or a prescription that is short of the total amount of pills that you were prescribed.  With controlled substances the law allows 72 hours for the pharmacy to complete the prescription.  If the prescription is not completed within 72 hours, the pharmacist will require a new prescription to be written. This means that you will be short on your medicine and we WILL NOT send another prescription to complete  your original prescription.  Instead, request the pharmacy to send a carrier to a nearby branch to get enough medication to provide you with your full prescription. Prescription Accuracy: You are responsible for carefully inspecting your prescriptions before leaving our office. Have the discharge nurse carefully go over each prescription with you, before taking them home. Make sure that your name is accurately spelled, that your address is correct. Check the name and dose of your medication to make sure it is accurate. Check the number of pills, and the written instructions to make sure they are clear and accurate. Make sure that you are given enough medication to last until your next medication refill appointment. Taking Medication: Take medication as prescribed. When it comes to controlled substances, taking less pills or less frequently than prescribed is permitted and encouraged. Never take more pills than instructed. Never take the medication more frequently than prescribed.  Inform other Doctors: Always inform, all of your healthcare providers, of all the medications you take. Pain Medication from other Providers: You are not allowed to accept any additional pain medication from any  other Doctor or Healthcare provider. There are two exceptions to this rule. (see below) In the event that you require additional pain medication, you are responsible for notifying us, as stated below. Cough Medicine: Often these contain an opioid, such as codeine or hydrocodone. Never accept or take cough medicine containing these opioids if you are already taking an opioid* medication. The combination may cause respiratory failure and death. Medication Agreement: You are responsible for carefully reading and following our Medication Agreement. This must be signed before receiving any prescriptions from our practice. Safely store a copy of your signed Agreement. Violations to the Agreement will result in no further  prescriptions. (Additional copies of our Medication Agreement are available upon request.) Laws, Rules, & Regulations: All patients are expected to follow all [address] and [employer], Rules, [place]. Ignorance of the Laws does not constitute a valid excuse.  Illegal drugs and Controlled Substances: The use of illegal substances (including, but not limited to marijuana and its derivatives) and/or the illegal use of any controlled substances is strictly prohibited. Violation of this rule may result in the immediate and permanent discontinuation of any and all prescriptions being written by our practice. The use of any illegal substances is prohibited. Adopted CDC guidelines & recommendations: Target dosing levels will be at or below 60 MME/day. Use of benzodiazepines** is not recommended. Urine Drug testing: Patients taking controlled substances will be required to provide a urine sample upon request. Do not void before coming to your medication management appointments. Hold emptying your bladder until you are admitted. The admitting nurse will inform you if a sample is required. Our practice reserves the right to call you at any time to provide a sample. Once receiving the call, you have 24 hours to comply with request. Not providing a sample upon request may result in termination of medication therapy.  Exceptions: There are only two exceptions to the rule of not receiving pain medications from other Healthcare Providers. Exception #1 (Emergencies): In the event of an emergency (i.e.: accident requiring emergency care), you are allowed to receive additional pain medication. However, you are responsible for: As soon as you are able, call our office [phone], at any time of the day or night, and leave a message stating your name, the date and nature of the emergency, and the name and dose of the medication prescribed. In the event that your call is answered by a member of our staff, make  sure to document and save the date, time, and the name of the person that took your information.  Exception #2 (Planned Surgery): In the event that you are scheduled by another doctor or dentist to have any type of surgery or procedure, you are allowed (for a period no longer than 30 days), to receive additional pain medication, for the acute post-op pain. However, in this case, you are responsible for picking up a copy of our "Post-op Pain Management for Surgeons" handout, and giving it to your surgeon or dentist. This document is available at our office, and does not require an appointment to obtain it. Simply go to our office during business hours (Monday-Thursday from 8:00 AM to 4:00 PM) (Friday 8:00 AM to 12:00 Noon) or if you have a scheduled appointment with [place], prior to your surgery, and ask for it by name. In addition, you are responsible for: calling our office [phone], at any time of the day or night, and leaving a message stating your name, name of your  surgeon, type of surgery, and date of procedure or surgery. Failure to comply with your responsibilities may result in termination of therapy involving the controlled substances.  Consequences:  Non-compliance with the above rules may result in permanent discontinuation of medication prescription therapy. All patients receiving any type of controlled substance is expected to comply with the above patient responsibilities. Not doing so may result in permanent discontinuation of medication prescription therapy. Medication Agreement Violation. Following the above rules, including your responsibilities will help you in avoiding a Medication Agreement Violation ("Breaking your Pain Medication Contract").  *Opioid medications include: morphine, codeine, oxycodone, oxymorphone, hydrocodone, hydromorphone, meperidine, tramadol, tapentadol, buprenorphine, fentanyl, methadone. **Benzodiazepine medications include: diazepam (Valium), alprazolam  (Xanax), clonazepam (Klonopine), lorazepam (Ativan), clorazepate (Tranxene), chlordiazepoxide (Librium), estazolam (Prosom), oxazepam (Serax), temazepam (Restoril), triazolam (Halcion) (Last updated: 05/21/2023) ______________________________________________________________________      ______________________________________________________________________    Medication Recommendations and Reminders  Applies to: All patients receiving prescriptions (written and/or electronic).  Medication Rules & Regulations: You are responsible for reading, knowing, and following our "Medication Rules" document. These exist for your safety and that of others. They are not flexible and neither are we. Dismissing or ignoring them is an act of "non-compliance" that may result in complete and irreversible termination of such medication therapy. For safety reasons, "non-compliance" will not be tolerated. As with the [place] fundamental legal principle of "ignorance of the law is no defense", we will accept no excuses for not having read and knowing the content of documents provided to you by our practice.  Pharmacy of record:  Definition: This is the pharmacy where your electronic prescriptions will be sent.  We do not endorse any particular pharmacy. It is up to you and your insurance to decide what pharmacy to use.  We do not restrict you in your choice of pharmacy. However, once we write for your prescriptions, we will NOT be re-sending more prescriptions to fix restricted supply problems created by your pharmacy, or your insurance.  The pharmacy listed in the electronic medical record should be the one where you want electronic prescriptions to be sent. If you choose to change pharmacy, simply notify our nursing staff. Changes will be made only during your regular appointments and not over the phone.  Recommendations: Keep all of your pain medications in a safe place, under lock and key, even if you live alone. We  will NOT replace lost, stolen, or damaged medication. We do not accept "Police Reports" as proof of medications having been stolen. After you fill your prescription, take 1 week's worth of pills and put them away in a safe place. You should keep a separate, properly labeled bottle for this purpose. The remainder should be kept in the original bottle. Use this as your primary supply, until it runs out. Once it's gone, then you know that you have 1 week's worth of medicine, and it is time to come in for a prescription refill. If you do this correctly, it is unlikely that you will ever run out of medicine. To make sure that the above recommendation works, it is very important that you make sure your medication refill appointments are scheduled at least 1 week before you run out of medicine. To do this in an effective manner, make sure that you do not leave the office without scheduling your next medication management appointment. Always ask the nursing staff to show you in your prescription , when your medication will be running out. Then arrange for the receptionist to get you  a return appointment, at least 7 days before you run out of medicine. Do not wait until you have 1 or 2 pills left, to come in. This is very poor planning and does not take into consideration that we may need to cancel appointments due to bad weather, sickness, or emergencies affecting our staff. DO NOT ACCEPT A "Partial Fill": If for any reason your pharmacy does not have enough pills/tablets to completely fill or refill your prescription, do not allow for a "partial fill". The law allows the pharmacy to complete that prescription within 72 hours, without requiring a new prescription. If they do not fill the rest of your prescription within those 72 hours, you will need a separate prescription to fill the remaining amount, which we will NOT provide. If the reason for the partial fill is your insurance, you will need to talk to the pharmacist  about payment alternatives for the remaining tablets, but again, DO NOT ACCEPT A PARTIAL FILL, unless you can trust your pharmacist to obtain the remainder of the pills within 72 hours.  Prescription refills and/or changes in medication(s):  Prescription refills, and/or changes in dose or medication, will be conducted only during scheduled medication management appointments. (Applies to both, written and electronic prescriptions.) No refills on procedure days. No medication will be changed or started on procedure days. No changes, adjustments, and/or refills will be conducted on a procedure day. Doing so will interfere with the diagnostic portion of the procedure. No phone refills. No medications will be "called into the pharmacy". No Fax refills. No weekend refills. No Holliday refills. No after hours refills.  Remember:  Business hours are:  Monday to Thursday 8:00 AM to 4:00 PM Provider's Schedule: Delano Metz, MD - Appointments are:  Medication management: Monday and Wednesday 8:00 AM to 4:00 PM Procedure day: Tuesday and Thursday 7:30 AM to 4:00 PM Edward Jolly, MD - Appointments are:  Medication management: Tuesday and Thursday 8:00 AM to 4:00 PM Procedure day: Monday and Wednesday 7:30 AM to 4:00 PM (Last update: 05/21/2022) ______________________________________________________________________      ______________________________________________________________________     Naloxone Nasal Spray  Why am I receiving this medication? [place] STOP ACT requires that all patients taking high dose opioids or at risk of opioids respiratory depression, be prescribed an opioid reversal agent, such as Naloxone (AKA: Narcan).  What is this medication? NALOXONE (nal OX one) treats opioid overdose, which causes slow or shallow breathing, severe drowsiness, or trouble staying awake. Call emergency services after using this medication. You may need additional treatment. Naloxone  works by reversing the effects of opioids. It belongs to a group of medications called opioid blockers.  COMMON BRAND NAME(S): Kloxxado, Narcan  What should I tell my care team before I take this medication? They need to know if you have any of these conditions: Heart disease Substance use disorder An unusual or allergic reaction to naloxone, other medications, foods, dyes, or preservatives Pregnant or trying to get pregnant Breast-feeding  When to use this medication? This medication is to be used for the treatment of respiratory depression (less than 8 breaths per minute) secondary to opioid overdose.   How to use this medication? This medication is for use in the nose. Lay the person on their back. Support their neck with your hand and allow the head to tilt back before giving the medication. The nasal spray should be given into 1 nostril. After giving the medication, move the person onto their side. Do not remove  or test the nasal spray until ready to use. Get emergency medical help right away after giving the first dose of this medication, even if the person wakes up. You should be familiar with how to recognize the signs and symptoms of a narcotic overdose. If more doses are needed, give the additional dose in the other nostril. Talk to your care team about the use of this medication in children. While this medication may be prescribed for children as young as newborns for selected conditions, precautions do apply.  Naloxone Overdosage: If you think you have taken too much of this medicine contact a poison control center or emergency room at once.  NOTE: This medicine is only for you. Do not share this medicine with others.  What if I miss a dose? This does not apply.  What may interact with this medication? This is only used during an emergency. No interactions are expected during emergency use. This list may not describe all possible interactions. Give your health care provider a  list of all the medicines, herbs, non-prescription drugs, or dietary supplements you use. Also tell them if you smoke, drink alcohol, or use illegal drugs. Some items may interact with your medicine.  What should I watch for while using this medication? Keep this medication ready for use in the case of an opioid overdose. Make sure that you have the phone number of your care team and local hospital ready. You may need to have additional doses of this medication. Each nasal spray contains a single dose. Some emergencies may require additional doses. After use, bring the treated person to the nearest hospital or call 911. Make sure the treating care team knows that the person has received a dose of this medication. You will receive additional instructions on what to do during and after use of this medication before an emergency occurs.  What side effects may I notice from receiving this medication? Side effects that you should report to your care team as soon as possible: Allergic reactions--skin rash, itching, hives, swelling of the face, lips, tongue, or throat Side effects that usually do not require medical attention (report these to your care team if they continue or are bothersome): Constipation Dryness or irritation inside the nose Headache Increase in blood pressure Muscle spasms Stuffy nose Toothache This list may not describe all possible side effects. Call your doctor for medical advice about side effects. You may report side effects to FDA at 1-800-FDA-1088.  Where should I keep my medication? Because this is an emergency medication, you should keep it with you at all times.  Keep out of the reach of children and pets. Store between 20 and 25 degrees C (68 and 77 degrees F). Do not freeze. Throw away any unused medication after the expiration date. Keep in original box until ready to use.  NOTE: This sheet is a summary. It may not cover all possible information. If you have  questions about this medicine, talk to your doctor, pharmacist, or health care provider.   2023 Elsevier/Gold Standard (2021-03-23 00:00:00)  ______________________________________________________________________

## 2023-09-24 ENCOUNTER — Telehealth: Payer: Self-pay

## 2023-09-24 NOTE — Telephone Encounter (Signed)
 No issues post-procedure.

## 2023-09-25 NOTE — Telephone Encounter (Signed)
 Attempt to call patient. LVM regarding post-procedure f/u.

## 2023-10-01 ENCOUNTER — Encounter: Payer: BLUE CROSS/BLUE SHIELD | Admitting: Pain Medicine

## 2023-10-08 ENCOUNTER — Other Ambulatory Visit: Payer: Self-pay | Admitting: Family Medicine

## 2023-10-08 DIAGNOSIS — F5101 Primary insomnia: Secondary | ICD-10-CM

## 2023-10-08 NOTE — Progress Notes (Unsigned)
 Patient: Brittney Simpson  Service Category: E/M  Provider: Oswaldo Done, MD  DOB: November 23, 1927  DOS: 10/09/2023  Location: Office  MRN: [id number]  Setting: Ambulatory outpatient  Referring Provider: Saralyn Pilar *  Type: Established Patient  Specialty: Interventional Pain Management  PCP: Smitty Cords, DO  Location: Remote location  Delivery: TeleHealth     Virtual Encounter - Pain Management PROVIDER NOTE: Information contained herein reflects review and annotations entered in association with encounter. Interpretation of such information and data should be left to medically-trained personnel. Information provided to patient can be located elsewhere in the medical record under "Patient Instructions". Document created using STT-dictation technology, any transcriptional errors that may result from process are unintentional.    Contact & Pharmacy Preferred: [phone] Home: [phone] (home) Mobile: [phone] (mobile) E-mail: [email or web address]  [facility] - [place], [place] - [address] [place] [address] Phone: [phone] Fax: [phone]   Pre-screening  [name] offered "in-person" vs "virtual" encounter. She indicated preferring virtual for this encounter.   Reason COVID-19*  Social distancing based on CDC and AMA recommendations.   I contacted FLORRIE RAMIRES on 10/09/2023 via telephone.      I clearly identified myself as Oswaldo Done, MD. I verified that I was speaking with the correct person using two identifiers (Name: Brittney Simpson, and date of birth: 05-11-1928).  Consent I sought verbal advanced consent from [name] for virtual visit interactions. I informed Ms. Route of possible security and privacy concerns, risks, and limitations associated with providing "not-in-person" medical evaluation and management services. I also informed [name] of the availability of "in-person"  appointments. Finally, I informed her that there would be a charge for the virtual visit and that she could be  personally, fully or partially, financially responsible for it. Ms. Gilchrest expressed understanding and agreed to proceed.   Historic Elements   Brittney Simpson is a 89 y.o. year old, female patient evaluated today after our last contact on 09/23/2023. Brittney Simpson  has a past medical history of Anxiety, Arthritis, Back ache, Hiatal hernia, Hyperlipidemia, Hypertension, Hypothyroid, Osteopenia, Thyroid disease, and UTI (urinary tract infection). She also  has a past surgical history that includes Hernia repair; Hernia repair; Hemorrhoid surgery; Kyphoplasty (N/A, 01/23/2017); Kyphoplasty (N/A, 01/25/2020); and Cholecystectomy. Brittney Simpson has a current medication list which includes the following prescription(s): acetaminophen, amlodipine, brimonidine, vitamin d3, clonazepam, cranberry, furosemide, latanoprost, levothyroxine, lisinopril, melatonin, metoprolol succinate, omeprazole, tamsulosin, tramadol, and trazodone. She  reports that she has never smoked. She has never used smokeless tobacco. She reports that she does not drink alcohol and does not use drugs. Brittney Simpson has no known allergies.  BMI: Estimated body mass index is 20.19 kg/m as calculated from the following:   Height as of 09/23/23: 5\' 3"  (1.6 m).   Weight as of 09/23/23: 114 lb (51.7 kg). Last encounter: 07/17/2023. Last procedure: 09/23/2023.  HPI  Today, she is being contacted for a post-procedure assessment.  Post-procedure evaluation   Type: Lumbar epidural steroid injection (LESI) (interlaminar)  #11  (since 08/22/2015) (last done on 07/01/2023) Laterality: Right   Level:  L1-2 Level.  Imaging: Fluoroscopic guidance Spinal ([id number]) Anesthesia: Local anesthesia (1-2% Lidocaine) Anxiolysis: None                 Sedation: No Sedation                       DOS:  09/23/2023  Performed by: Oswaldo Done, MD  Purpose: Diagnostic/Therapeutic Indications: Lumbar radicular pain of intraspinal etiology of more than 4 weeks that has failed to respond to conservative therapy and is severe enough to impact quality of life or function. 1. Chronic low back pain (Right) w/ radicular pain (Right)   2. Chronic lower extremity pain (3ry area of Pain) (Right)   3. Chronic hip pain (2ry area of Pain) (Right)   4. Degeneration of intervertebral disc of lumbosacral region with discogenic back pain and lower extremity pain   5. Spinal stenosis of lumbar region with neurogenic claudication   6. Spondylolisthesis of lumbosacral region (L2-3 and L5-S1)   7. T12 compression fracture, sequela   8. Abnormal MRI, lumbar spine (02/02/2020)    NAS-11 Pain score:   Pre-procedure: 9 /10   Post-procedure: 9 /10     Effectiveness:  Initial hour after procedure: 100 %. Subsequent 4-6 hours post-procedure: 100 %. Analgesia past initial 6 hours: 70 % (ongoing). Ongoing improvement:  Analgesic: The patient indicated having attained 100% relief of the pain coloration local anesthetic followed by an ongoing 70-75% improvement. Function: [name] reports improvement in function ROM: [name] reports improvement in ROM  Pharmacotherapy Assessment  Opioid Analgesic: Tramadol 50 mg, 1 tab PO q 8 hrs (150 mg/day of tramadol) MME/day: 15 mg/day.   Monitoring: [facility] PMP: PDMP reviewed during this encounter.       Pharmacotherapy: No side-effects or adverse reactions reported. Compliance: No problems identified. Effectiveness: Clinically acceptable. Plan: Refer to "POC". UDS:  Summary  Date Value Ref Range Status  04/07/2023 Note  Final    Comment:    ==================================================================== [employer] Select 13 (MW) ==================================================================== Test                             Result       Flag       Units  Drug Present and Declared for  Prescription Verification   7-aminoclonazepam              255          EXPECTED   ng/mg creat    7-aminoclonazepam is an expected metabolite of clonazepam. Source of    clonazepam is a scheduled prescription medication.    Tramadol                       >25000       EXPECTED   ng/mg creat   O-Desmethyltramadol            >25000       EXPECTED   ng/mg creat   N-Desmethyltramadol            7320         EXPECTED   ng/mg creat    Source of tramadol is a prescription medication. O-desmethyltramadol    and N-desmethyltramadol are expected metabolites of tramadol.  ==================================================================== Test                      Result    Flag   Units      Ref Range   Creatinine              20               mg/dL      >=16 ==================================================================== Declared Medications:  The flagging and interpretation on this report are based  on the  following declared medications.  Unexpected results may arise from  inaccuracies in the declared medications.   **Note: The testing scope of this panel includes these medications:   Clonazepam (Klonopin)  Tramadol (Ultram)   **Note: The testing scope of this panel does not include the  following reported medications:   Acetaminophen (Tylenol)  Amlodipine (Norvasc)  Cranberry  Eye Drops  Furosemide (Lasix)  Levothyroxine (Synthroid)  Lisinopril (Zestril)  Melatonin  Methocarbamol (Robaxin)  Metoprolol (Toprol)  Naloxone (Narcan)  Omeprazole (Prilosec)  Tamsulosin (Flomax)  Trazodone (Desyrel)  Vitamin D3 ==================================================================== For clinical consultation, please call [phone]. ====================================================================    No results found for: "CBDTHCR", "D8THCCBX", "D9THCCBX"   Laboratory Chemistry Profile   Renal Lab Results  Component Value Date   BUN 9 04/21/2023   CREATININE 0.79  04/21/2023   BCR SEE NOTE: 04/21/2023   GFRAA 76 05/16/2020   GFRNONAA 66 05/16/2020    Hepatic Lab Results  Component Value Date   AST 8 (L) 04/21/2023   ALT 6 04/21/2023   ALBUMIN 3.6 11/22/2016   ALKPHOS 59 11/22/2016   LIPASE 21 06/14/2016    Electrolytes Lab Results  Component Value Date   NA 137 04/21/2023   K 4.2 04/21/2023   CL 99 04/21/2023   CALCIUM 9.3 04/21/2023   MG 2.1 01/14/2014    Bone No results found for: "VD25OH", "VD125OH2TOT", "UJ8119JY7", "WG9562ZH0", "25OHVITD1", "25OHVITD2", "25OHVITD3", "TESTOFREE", "TESTOSTERONE"  Inflammation (CRP: Acute Phase) (ESR: Chronic Phase) No results found for: "CRP", "ESRSEDRATE", "LATICACIDVEN"       Note: Above Lab results reviewed.  Imaging  DG PAIN CLINIC C-ARM 1-60 MIN NO REPORT Fluoro was used, but no Radiologist interpretation will be provided.  Please refer to "NOTES" tab for provider progress note.  Assessment  The primary encounter diagnosis was Chronic low back pain (Right) w/ radicular pain (Right). Diagnoses of Chronic lower extremity pain (3ry area of Pain) (Right), Chronic hip pain (2ry area of Pain) (Right), Postop check, Abnormal MRI, lumbar spine (02/02/2020), Chronic lumbar radicular pain (Right), Degeneration of intervertebral disc of lumbosacral region with discogenic back pain and lower extremity pain, Spinal stenosis of lumbar region with neurogenic claudication, Spondylolisthesis of lumbosacral region (L2-3 and L5-S1), and T12 compression fracture, sequela were also pertinent to this visit.  Plan of Care  Problem-specific:  No problem-specific Assessment & Plan notes found for this encounter.  Ms. JAZIYA OBARR has a current medication list which includes the following long-term medication(s): amlodipine, clonazepam, furosemide, levothyroxine, lisinopril, metoprolol succinate, omeprazole, tramadol, and trazodone.  Pharmacotherapy (Medications Ordered): No orders of the defined types were  placed in this encounter.  Orders:  Orders Placed This Encounter  Procedures   Lumbar Epidural Injection    Standing Status:   Standing    Number of Occurrences:   5    Expiration Date:   10/08/2024    Scheduling Instructions:     Procedure: Interlaminar Lumbar Epidural Steroid injection (LESI)  L1-2     Laterality: Right-sided     Sedation: None required.     Timeframe: PRN (patient will call)    Where will this procedure be performed?:   [facility]   Nursing Instructions:    Please complete this patient's postprocedure evaluation.    Scheduling Instructions:     Please complete this patient's postprocedure evaluation.   Follow-up plan:   Return if symptoms worsen or fail to improve, for (PRN) (Clinic): (R) L1-2 LESI.      Interventional Therapies  Risk  Complexity Considerations:   Advanced age  HOH  AAA  HTN  Stage3 CKD  GERD   Note: Hard of hearing   Planned  Pending:      Under consideration:   (PRN) palliative right L1-2 LESI #12    Completed:   Palliative right L1-2 LESI x11 (09/23/2023) (100/100/80/70-75)  Therapeutic right L2-3 LESI x1 (03/07/2020) (100/100/25/<50)  Diagnostic/therapeutic bilateral SI joint injection x1 (02/10/2020) (100/100/100 x2 days/0)  Palliative right lumbar facet MBB x13 (01/04/2020) (8 to 0) (100/100/100)  Palliative left lumbar facet MBB x11 (01/04/2020) (8 to 0) (100/100/100)    Therapeutic  Palliative (PRN) options:   Palliative right T12-L1 LESI (PRN)   Pharmacotherapy  Nonopioids transferred 04/11/2020: Robaxin      Recent Visits Date Type Provider Dept  09/23/23 Procedure visit Delano Metz, MD [facility]  07/17/23 Office Visit Delano Metz, MD [facility]  Showing recent visits within past 90 days and meeting all other requirements Today's Visits Date Type Provider Dept  10/09/23 Office Visit Delano Metz, MD [facility]  Showing today's visits and meeting all  other requirements Future Appointments No visits were found meeting these conditions. Showing future appointments within next 90 days and meeting all other requirements  I discussed the assessment and treatment plan with the patient. The patient was provided an opportunity to ask questions and all were answered. The patient agreed with the plan and demonstrated an understanding of the instructions.  Patient advised to call back or seek an in-person evaluation if the symptoms or condition worsens.  Duration of encounter: 12 minutes.  Note by: Oswaldo Done, MD Date: 10/09/2023; Time: 3:31 PM

## 2023-10-09 ENCOUNTER — Ambulatory Visit: Payer: BLUE CROSS/BLUE SHIELD | Attending: Pain Medicine | Admitting: Pain Medicine

## 2023-10-09 DIAGNOSIS — M51372 Other intervertebral disc degeneration, lumbosacral region with discogenic back pain and lower extremity pain: Secondary | ICD-10-CM

## 2023-10-09 DIAGNOSIS — M25551 Pain in right hip: Secondary | ICD-10-CM

## 2023-10-09 DIAGNOSIS — Z09 Encounter for follow-up examination after completed treatment for conditions other than malignant neoplasm: Secondary | ICD-10-CM

## 2023-10-09 DIAGNOSIS — M48062 Spinal stenosis, lumbar region with neurogenic claudication: Secondary | ICD-10-CM

## 2023-10-09 DIAGNOSIS — M5416 Radiculopathy, lumbar region: Secondary | ICD-10-CM | POA: Diagnosis not present

## 2023-10-09 DIAGNOSIS — M4317 Spondylolisthesis, lumbosacral region: Secondary | ICD-10-CM

## 2023-10-09 DIAGNOSIS — M79604 Pain in right leg: Secondary | ICD-10-CM

## 2023-10-09 DIAGNOSIS — S22080S Wedge compression fracture of T11-T12 vertebra, sequela: Secondary | ICD-10-CM

## 2023-10-09 DIAGNOSIS — R937 Abnormal findings on diagnostic imaging of other parts of musculoskeletal system: Secondary | ICD-10-CM

## 2023-10-09 DIAGNOSIS — G8929 Other chronic pain: Secondary | ICD-10-CM

## 2023-10-09 NOTE — Telephone Encounter (Signed)
 Requested Prescriptions  Pending Prescriptions Disp Refills   traZODone (DESYREL) 50 MG tablet [Pharmacy Med Name: TRAZODONE HCL 50 MG TAB] 90 tablet 0    Sig: TAKE 1 TABLET BY MOUTH AT BEDTIME     Psychiatry: Antidepressants - Serotonin Modulator Passed - 10/09/2023  8:46 AM      Passed - Valid encounter within last 6 months    Recent Outpatient Visits           5 months ago Benign hypertension with CKD (chronic kidney disease) stage III [facility])   [place] [facility], Netta Neat, DO   1 year ago Annual physical exam   [facility] [facility] Smitty Cords, DO   1 year ago RLS (restless legs syndrome)   [place] [facility] Smitty Cords, DO   1 year ago Benign hypertension with CKD (chronic kidney disease) stage III [facility])   [place] [facility] [place], Netta Neat, DO   2 years ago Benign hypertension with CKD (chronic kidney disease) stage III [facility])   [place] [facility] [place], Netta Neat, DO       Future Appointments             In 7 months MacDiarmid, Lorin Picket, MD [facility]

## 2023-10-11 ENCOUNTER — Other Ambulatory Visit: Payer: Self-pay | Admitting: Family Medicine

## 2023-10-11 DIAGNOSIS — F5101 Primary insomnia: Secondary | ICD-10-CM

## 2023-10-13 NOTE — Telephone Encounter (Signed)
 Duplicate request, last refill 10/09/23.  Requested Prescriptions  Pending Prescriptions Disp Refills   traZODone (DESYREL) 50 MG tablet [Pharmacy Med Name: TRAZODONE HCL 50 MG TAB] 90 tablet 0    Sig: TAKE 1 TABLET BY MOUTH AT BEDTIME     Psychiatry: Antidepressants - Serotonin Modulator Passed - 10/13/2023  3:48 PM      Passed - Valid encounter within last 6 months    Recent Outpatient Visits           5 months ago Benign hypertension with CKD (chronic kidney disease) stage III [facility])   [place] [facility] [place] [facility] [place], Netta Neat, DO   1 year ago Annual physical exam   [place] [facility] Smitty Cords, DO   1 year ago RLS (restless legs syndrome)   [place] [facility] Smitty Cords, DO   1 year ago Benign hypertension with CKD (chronic kidney disease) stage III [facility])   [place] [facility], Netta Neat, DO   2 years ago Benign hypertension with CKD (chronic kidney disease) stage III [facility])    [facility] [place], Netta Neat, DO       Future Appointments             In 7 months MacDiarmid, Lorin Picket, MD [facility]

## 2023-10-15 ENCOUNTER — Encounter: Payer: Medicare Other | Admitting: Pain Medicine

## 2023-10-29 ENCOUNTER — Other Ambulatory Visit: Payer: Self-pay | Admitting: Family Medicine

## 2023-10-29 DIAGNOSIS — I129 Hypertensive chronic kidney disease with stage 1 through stage 4 chronic kidney disease, or unspecified chronic kidney disease: Secondary | ICD-10-CM

## 2023-10-30 NOTE — Telephone Encounter (Signed)
 Requested medications are due for refill today.  yes  Requested medications are on the active medications list.  yes  Last refill. 08/04/2023 #135 0 rf  Future visit scheduled.   no  Notes to clinic.  Pt last seen 04/29/2023.    Requested Prescriptions  Pending Prescriptions Disp Refills   furosemide (LASIX) 20 MG tablet [Pharmacy Med Name: FUROSEMIDE 20 MG TAB] 135 tablet 0    Sig: TAKE 1 and 1/2 TABLETS BY MOUTH ONCE DAILY     Cardiovascular:  Diuretics - Loop Failed - 10/30/2023  5:46 PM      Failed - K in normal range and within 180 days    Potassium  Date Value Ref Range Status  04/21/2023 4.2 3.5 - 5.3 mmol/L Final  11/24/2014 4.2 mmol/L Final    Comment:    3.5-5.1 NOTE: New Reference Range  10/04/14          Failed - Ca in normal range and within 180 days    Calcium  Date Value Ref Range Status  04/21/2023 9.3 8.6 - 10.4 mg/dL Final   Calcium, Total  Date Value Ref Range Status  11/24/2014 8.9 mg/dL Final    Comment:    1.4-78.2 NOTE: New Reference Range  10/04/14          Failed - Na in normal range and within 180 days    Sodium  Date Value Ref Range Status  04/21/2023 137 135 - 146 mmol/L Final  11/24/2014 131 (L) mmol/L Final    Comment:    135-145 NOTE: New Reference Range  10/04/14          Failed - Cr in normal range and within 180 days    Creat  Date Value Ref Range Status  04/21/2023 0.79 0.60 - 0.95 mg/dL Final         Failed - Cl in normal range and within 180 days    Chloride  Date Value Ref Range Status  04/21/2023 99 98 - 110 mmol/L Final  11/24/2014 97 (L) mmol/L Final    Comment:    101-111 NOTE: New Reference Range  10/04/14          Failed - Mg Level in normal range and within 180 days    Magnesium  Date Value Ref Range Status  01/14/2014 2.1 mg/dL Final    Comment:    9.5-6.2 THERAPEUTIC RANGE: 4-7 mg/dL TOXIC: > 10 mg/dL  -----------------------          Failed - Last BP in normal range    BP Readings from  Last 1 Encounters:  09/23/23 (!) 172/83         Failed - Valid encounter within last 6 months    Recent Outpatient Visits   None     Future Appointments             In 7 months MacDiarmid, Lorin Picket, MD [facility]

## 2023-10-31 ENCOUNTER — Other Ambulatory Visit: Payer: Self-pay | Admitting: Family Medicine

## 2023-10-31 DIAGNOSIS — I129 Hypertensive chronic kidney disease with stage 1 through stage 4 chronic kidney disease, or unspecified chronic kidney disease: Secondary | ICD-10-CM

## 2023-10-31 MED ORDER — FUROSEMIDE 20 MG PO TABS
30.0000 mg | ORAL_TABLET | Freq: Every day | ORAL | 0 refills | Status: DC
Start: 2023-10-31 — End: 2024-01-29

## 2023-10-31 NOTE — Telephone Encounter (Signed)
 Copied from CRM [id number]. Topic: Clinical - Medication Refill >> Oct 31, 2023 11:52 AM Eunice Blase wrote: Most Recent Primary Care Visit:  Provider: Smitty Cords  Department[id number]  Visit Type: OFFICE VISIT  Date: 04/29/2023  Medication: furosemide (LASIX) 20 MG tablet  Has the patient contacted their pharmacy? Yes (Agent: If no, request that the patient contact the pharmacy for the refill. If patient does not wish to contact the pharmacy document the reason why and proceed with request.) (Agent: If yes, when and what did the pharmacy advise?)Pharmacy need script  Is this the correct pharmacy for this prescription? Yes If no, delete pharmacy and type the correct one.  This is the patient's preferred pharmacy:  [facility] - [place], [place] - [address] [place] [address] Phone: [phone] Fax: [phone]   Has the prescription been filled recently? Yes  Is the patient out of the medication? Yes  Has the patient been seen for an appointment in the last year OR does the patient have an upcoming appointment? Yes  Can we respond through MyChart? Yes  Agent: Please be advised that Rx refills may take up to 3 business days. We ask that you follow-up with your pharmacy.

## 2023-10-31 NOTE — Telephone Encounter (Signed)
 Requested medication (s) are due for refill today: yes  Requested medication (s) are on the active medication list: yes  Last refill:  08/04/23 #135/0  Future visit scheduled: no  Notes to clinic:  Unable to refill per protocol due to failed labs, no updated results.      Requested Prescriptions  Pending Prescriptions Disp Refills   furosemide (LASIX) 20 MG tablet 135 tablet 0    Sig: Take 1.5 tablets (30 mg total) by mouth daily.     Cardiovascular:  Diuretics - Loop Failed - 10/31/2023 12:20 PM      Failed - K in normal range and within 180 days    Potassium  Date Value Ref Range Status  04/21/2023 4.2 3.5 - 5.3 mmol/L Final  11/24/2014 4.2 mmol/L Final    Comment:    3.5-5.1 NOTE: New Reference Range  10/04/14          Failed - Ca in normal range and within 180 days    Calcium  Date Value Ref Range Status  04/21/2023 9.3 8.6 - 10.4 mg/dL Final   Calcium, Total  Date Value Ref Range Status  11/24/2014 8.9 mg/dL Final    Comment:    1.6-10.9 NOTE: New Reference Range  10/04/14          Failed - Na in normal range and within 180 days    Sodium  Date Value Ref Range Status  04/21/2023 137 135 - 146 mmol/L Final  11/24/2014 131 (L) mmol/L Final    Comment:    135-145 NOTE: New Reference Range  10/04/14          Failed - Cr in normal range and within 180 days    Creat  Date Value Ref Range Status  04/21/2023 0.79 0.60 - 0.95 mg/dL Final         Failed - Cl in normal range and within 180 days    Chloride  Date Value Ref Range Status  04/21/2023 99 98 - 110 mmol/L Final  11/24/2014 97 (L) mmol/L Final    Comment:    101-111 NOTE: New Reference Range  10/04/14          Failed - Mg Level in normal range and within 180 days    Magnesium  Date Value Ref Range Status  01/14/2014 2.1 mg/dL Final    Comment:    6.0-4.5 THERAPEUTIC RANGE: 4-7 mg/dL TOXIC: > 10 mg/dL  -----------------------          Failed - Last BP in normal range    BP  Readings from Last 1 Encounters:  09/23/23 (!) 172/83         Failed - Valid encounter within last 6 months    Recent Outpatient Visits   None     Future Appointments             In 7 months MacDiarmid, Lorin Picket, MD [facility]

## 2023-11-12 ENCOUNTER — Other Ambulatory Visit: Payer: Self-pay | Admitting: Family Medicine

## 2023-11-12 DIAGNOSIS — E034 Atrophy of thyroid (acquired): Secondary | ICD-10-CM

## 2023-11-12 DIAGNOSIS — N183 Chronic kidney disease, stage 3 unspecified: Secondary | ICD-10-CM

## 2023-11-13 NOTE — Telephone Encounter (Signed)
 Requested Prescriptions  Pending Prescriptions Disp Refills   levothyroxine (SYNTHROID) 112 MCG tablet [Pharmacy Med Name: LEVOTHYROXINE SODIUM 112 MCG TAB] 90 tablet 1    Sig: TAKE 1 TABLET BY MOUTH ONCE DAILY ON AN EMPTY STOMACH. WAIT 30 MINUTES BEFORE TAKING OTHER MEDS.     Endocrinology:  Hypothyroid Agents Failed - 11/13/2023 12:41 PM      Failed - Valid encounter within last 12 months    Recent Outpatient Visits   None     Future Appointments             In 6 months MacDiarmid, Lorin Picket, MD [facility] [place]            Passed - TSH in normal range and within 360 days    TSH  Date Value Ref Range Status  04/21/2023 1.54 0.40 - 4.50 mIU/L Final          amLODipine (NORVASC) 5 MG tablet [Pharmacy Med Name: AMLODIPINE BESYLATE 5 MG TAB] 90 tablet 0    Sig: TAKE 1 TABLET BY MOUTH ONCE DAILY WITH LUNCH     Cardiovascular: Calcium Channel Blockers 2 Failed - 11/13/2023 12:41 PM      Failed - Last BP in normal range    BP Readings from Last 1 Encounters:  09/23/23 (!) 172/83         Failed - Valid encounter within last 6 months    Recent Outpatient Visits   None     Future Appointments             In 6 months MacDiarmid, Lorin Picket, MD [facility] [place]            Passed - Last Heart Rate in normal range    Pulse Readings from Last 1 Encounters:  09/23/23 (!) 58          metoprolol succinate (TOPROL-XL) 100 MG 24 hr tablet [Pharmacy Med Name: METOPROLOL SUCCINATE ER 100 MG TAB] 90 tablet 0    Sig: TAKE 1 TABLET BY MOUTH ONCE DAILY WITH FOOD     Cardiovascular:  Beta Blockers Failed - 11/13/2023 12:41 PM      Failed - Last BP in normal range    BP Readings from Last 1 Encounters:  09/23/23 (!) 172/83         Failed - Valid encounter within last 6 months    Recent Outpatient Visits   None     Future Appointments             In 6 months MacDiarmid, Lorin Picket, MD [facility]             Passed - Last Heart Rate  in normal range    Pulse Readings from Last 1 Encounters:  09/23/23 (!) 58          lisinopril (ZESTRIL) 40 MG tablet [Pharmacy Med Name: LISINOPRIL 40 MG TAB] 90 tablet 0    Sig: TAKE 1 TABLET BY MOUTH ONCE DAILY     Cardiovascular:  ACE Inhibitors Failed - 11/13/2023 12:41 PM      Failed - Cr in normal range and within 180 days    Creat  Date Value Ref Range Status  04/21/2023 0.79 0.60 - 0.95 mg/dL Final         Failed - K in normal range and within 180 days    Potassium  Date Value Ref Range Status  04/21/2023 4.2 3.5 - 5.3 mmol/L Final  11/24/2014 4.2 mmol/L Final  Comment:    3.5-5.1 NOTE: New Reference Range  10/04/14          Failed - Last BP in normal range    BP Readings from Last 1 Encounters:  09/23/23 (!) 172/83         Failed - Valid encounter within last 6 months    Recent Outpatient Visits   None     Future Appointments             In 6 months MacDiarmid, Geralyn Knee, MD [facility] - Patient is not pregnant

## 2023-11-26 ENCOUNTER — Other Ambulatory Visit: Payer: Self-pay | Admitting: Family Medicine

## 2023-11-26 DIAGNOSIS — K219 Gastro-esophageal reflux disease without esophagitis: Secondary | ICD-10-CM

## 2023-11-28 ENCOUNTER — Telehealth (INDEPENDENT_AMBULATORY_CARE_PROVIDER_SITE_OTHER): Payer: Self-pay

## 2023-11-28 ENCOUNTER — Other Ambulatory Visit (INDEPENDENT_AMBULATORY_CARE_PROVIDER_SITE_OTHER): Payer: Self-pay | Admitting: Nurse Practitioner

## 2023-11-28 DIAGNOSIS — I7141 Pararenal abdominal aortic aneurysm, without rupture: Secondary | ICD-10-CM

## 2023-11-28 NOTE — Telephone Encounter (Signed)
 Spoke with Brittney Simpson and she states understanding and said that her sister reminded her she had contrast before and she apologizes for any confusions.

## 2023-11-28 NOTE — Telephone Encounter (Signed)
 I've added a new order to do w/o contrast, however, her mom has had contrast before.  In reviewing the previous CT scans she's had 4 that have used contrast dye from 2017-2023.  Also, If we were to ever need to repair her aneurysms, we have to use contrast dye to do that

## 2023-11-29 NOTE — Telephone Encounter (Signed)
 Too soon for refill.  Requested Prescriptions  Pending Prescriptions Disp Refills   omeprazole  (PRILOSEC) 20 MG capsule [Pharmacy Med Name: OMEPRAZOLE  DR 20 MG CAP] 90 capsule 1    Sig: TAKE 1 CAPSULE BY MOUTH ONCE DAILY BEFORE BREAKFAST     Gastroenterology: Proton Pump Inhibitors Failed - 11/29/2023 10:06 AM      Failed - Valid encounter within last 12 months    Recent Outpatient Visits   None     Future Appointments             In 6 months MacDiarmid, Geralyn Knee, MD [facility] Urology [facility]

## 2023-12-02 ENCOUNTER — Ambulatory Visit (INDEPENDENT_AMBULATORY_CARE_PROVIDER_SITE_OTHER): Payer: Medicare Other | Admitting: Vascular Surgery

## 2023-12-02 ENCOUNTER — Ambulatory Visit
Admission: RE | Admit: 2023-12-02 | Discharge: 2023-12-02 | Disposition: A | Discharge status: Home / Self Care | Source: Ambulatory Visit | Attending: Vascular Surgery | Admitting: Vascular Surgery

## 2023-12-02 DIAGNOSIS — I7141 Pararenal abdominal aortic aneurysm, without rupture: Secondary | ICD-10-CM | POA: Diagnosis not present

## 2023-12-02 DIAGNOSIS — R109 Unspecified abdominal pain: Secondary | ICD-10-CM | POA: Diagnosis not present

## 2023-12-02 DIAGNOSIS — I517 Cardiomegaly: Secondary | ICD-10-CM | POA: Diagnosis not present

## 2023-12-02 DIAGNOSIS — I2510 Atherosclerotic heart disease of native coronary artery without angina pectoris: Secondary | ICD-10-CM | POA: Diagnosis not present

## 2023-12-02 DIAGNOSIS — I712 Thoracic aortic aneurysm, without rupture, unspecified: Secondary | ICD-10-CM | POA: Diagnosis not present

## 2023-12-02 DIAGNOSIS — I7143 Infrarenal abdominal aortic aneurysm, without rupture: Secondary | ICD-10-CM | POA: Diagnosis not present

## 2023-12-02 DIAGNOSIS — I771 Stricture of artery: Secondary | ICD-10-CM | POA: Diagnosis not present

## 2023-12-02 DIAGNOSIS — K449 Diaphragmatic hernia without obstruction or gangrene: Secondary | ICD-10-CM | POA: Diagnosis not present

## 2023-12-02 MED ORDER — IOHEXOL 350 MG/ML SOLN
75.0000 mL | Freq: Once | INTRAVENOUS | Status: AC | PRN
  Administered 2023-12-02: 75 mL via INTRAVENOUS

## 2023-12-05 DIAGNOSIS — H6121 Impacted cerumen, right ear: Secondary | ICD-10-CM | POA: Diagnosis not present

## 2023-12-05 DIAGNOSIS — H903 Sensorineural hearing loss, bilateral: Secondary | ICD-10-CM | POA: Diagnosis not present

## 2023-12-16 ENCOUNTER — Ambulatory Visit (INDEPENDENT_AMBULATORY_CARE_PROVIDER_SITE_OTHER): Admitting: Vascular Surgery

## 2023-12-16 ENCOUNTER — Encounter (INDEPENDENT_AMBULATORY_CARE_PROVIDER_SITE_OTHER): Payer: Self-pay

## 2023-12-17 ENCOUNTER — Other Ambulatory Visit: Payer: Self-pay | Admitting: Family Medicine

## 2023-12-17 DIAGNOSIS — I129 Hypertensive chronic kidney disease with stage 1 through stage 4 chronic kidney disease, or unspecified chronic kidney disease: Secondary | ICD-10-CM

## 2023-12-18 NOTE — Telephone Encounter (Signed)
 Requested Prescriptions  Refused Prescriptions Disp Refills   metoprolol  succinate (TOPROL -XL) 100 MG 24 hr tablet [Pharmacy Med Name: METOPROLOL  SUCCINATE ER 100 MG TAB] 90 tablet 0    Sig: TAKE 1 TABLET BY MOUTH ONCE DAILY WITH FOOD     Cardiovascular:  Beta Blockers Failed - 12/18/2023  2:10 PM      Failed - Last BP in normal range    BP Readings from Last 1 Encounters:  09/23/23 (!) 172/83         Failed - Valid encounter within last 6 months    Recent Outpatient Visits   None     Future Appointments             In 5 months Erman Hayward, MD [facility] [place]            Passed - Last Heart Rate in normal range    Pulse Readings from Last 1 Encounters:  09/23/23 (!) 58

## 2023-12-19 ENCOUNTER — Ambulatory Visit (INDEPENDENT_AMBULATORY_CARE_PROVIDER_SITE_OTHER): Admitting: Vascular Surgery

## 2023-12-19 VITALS — BP 152/81 | HR 63 | Resp 17 | Ht 63.0 in | Wt 119.8 lb

## 2023-12-19 DIAGNOSIS — M5441 Lumbago with sciatica, right side: Secondary | ICD-10-CM | POA: Diagnosis not present

## 2023-12-19 DIAGNOSIS — I7141 Pararenal abdominal aortic aneurysm, without rupture: Secondary | ICD-10-CM

## 2023-12-19 DIAGNOSIS — E782 Mixed hyperlipidemia: Secondary | ICD-10-CM | POA: Diagnosis not present

## 2023-12-19 DIAGNOSIS — N1831 Chronic kidney disease, stage 3a: Secondary | ICD-10-CM | POA: Diagnosis not present

## 2023-12-19 DIAGNOSIS — I10 Essential (primary) hypertension: Secondary | ICD-10-CM | POA: Diagnosis not present

## 2023-12-19 DIAGNOSIS — G8929 Other chronic pain: Secondary | ICD-10-CM

## 2023-12-19 NOTE — Progress Notes (Signed)
 MRN : [id number]  Brittney Simpson is a 89 y.o. ([birth year]) female who presents with chief complaint of  Chief Complaint  Patient presents with   Venous Insufficiency  .  History of Present Illness: Patient returns today in follow up of abdominal aortic aneurysm.  She has a bilobed abdominal aortic aneurysm.  She underwent a recent CT scan of the abdomen pelvis which I have independently reviewed.  The more superior portion of this aneurysm is in the juxtarenal location.  I measured this at approximately 5.2 cm in maximal diameter.  This has grown some from her previous size of under 5 cm.  In the more distal infrarenal portion, this measures approximately 4 cm in maximal diameter.  No evidence of extravasation or dissection.  Current Outpatient Medications  Medication Sig Dispense Refill   acetaminophen  (TYLENOL ) 500 MG tablet Take 1,000 mg by mouth daily as needed for moderate pain or headache.     amLODipine  (NORVASC ) 5 MG tablet TAKE 1 TABLET BY MOUTH ONCE DAILY WITH LUNCH 90 tablet 0   brimonidine (ALPHAGAN) 0.2 % ophthalmic solution Place 1 drop into the left eye 2 (two) times daily.     Cholecalciferol (VITAMIN D3) 50 MCG (2000 UT) TABS Take 2,000 Units by mouth daily with lunch.     clonazePAM  (KLONOPIN ) 0.5 MG tablet TAKE 1/2 TABLET BY MOUTH TWICE DAILY 30 tablet 5   Cranberry 500 MG CAPS Take 1,000 mg by mouth daily with supper.     furosemide  (LASIX ) 20 MG tablet Take 1.5 tablets (30 mg total) by mouth daily. 135 tablet 0   latanoprost (XALATAN) 0.005 % ophthalmic solution Place 1 drop into both eyes at bedtime.      levothyroxine  (SYNTHROID ) 112 MCG tablet TAKE 1 TABLET BY MOUTH ONCE DAILY ON AN EMPTY STOMACH. WAIT 30 MINUTES BEFORE TAKING OTHER MEDS. 90 tablet 1   lisinopril  (ZESTRIL ) 40 MG tablet TAKE 1 TABLET BY MOUTH ONCE DAILY 90 tablet 0   Melatonin 10 MG TABS Take 10 mg by mouth at bedtime.     metoprolol  succinate (TOPROL -XL) 100 MG 24 hr tablet TAKE 1 TABLET BY  MOUTH ONCE DAILY WITH FOOD 90 tablet 0   omeprazole  (PRILOSEC) 20 MG capsule TAKE 1 CAPSULE BY MOUTH ONCE DAILY BEFORE BREAKFAST 90 capsule 1   tamsulosin  (FLOMAX ) 0.4 MG CAPS capsule Take 2 capsules (0.8 mg total) by mouth daily. 60 capsule 11   traMADol  (ULTRAM ) 50 MG tablet Take 1 tablet (50 mg total) by mouth 3 (three) times daily. Each refill must last 30 days. 90 tablet 5   traZODone  (DESYREL ) 50 MG tablet TAKE 1 TABLET BY MOUTH AT BEDTIME 90 tablet 0   No current facility-administered medications for this visit.    Past Medical History:  Diagnosis Date   Anxiety    Arthritis    Back ache    Hiatal hernia    Hyperlipidemia    Hypertension    Hypothyroid    Osteopenia    Thyroid  disease    UTI (urinary tract infection)     Past Surgical History:  Procedure Laterality Date   CHOLECYSTECTOMY     HEMORRHOID SURGERY     HERNIA REPAIR     HERNIA REPAIR     KYPHOPLASTY N/A 01/23/2017   Procedure: [id number];  Surgeon: Molli Angelucci, MD;  Location: [facility] ORS;  Service: Orthopedics;  Laterality: N/A;   KYPHOPLASTY N/A 01/25/2020   Procedure: L4 KYPHOPLASTY;  Surgeon: Molli Angelucci, MD;  Location: [facility]  ORS;  Service: Orthopedics;  Laterality: N/A;     Social History   Tobacco Use   Smoking status: Never   Smokeless tobacco: Never  Vaping Use   Vaping status: Never Used  Substance Use Topics   Alcohol use: No    Alcohol/week: 0.0 standard drinks of alcohol   Drug use: No      Family History  Problem Relation Age of Onset   Stroke Mother    Cancer Father      No Known Allergies   REVIEW OF SYSTEMS (Negative unless checked)   Constitutional: [] Weight loss  [] Fever  [] Chills Cardiac: [] Chest pain   [] Chest pressure   [] Palpitations   [] Shortness of breath when laying flat   [] Shortness of breath at rest   [] Shortness of breath with exertion. Vascular:  [] Pain in legs with walking   [] Pain in legs at rest   [] Pain in legs when laying flat   [] Claudication    [] Pain in feet when walking  [] Pain in feet at rest  [] Pain in feet when laying flat   [] History of DVT   [] Phlebitis   [] Swelling in legs   [] Varicose veins   [] Non-healing ulcers Pulmonary:   [] Uses home oxygen   [] Productive cough   [] Hemoptysis   [] Wheeze  [] COPD   [] Asthma Neurologic:  [] Dizziness  [] Blackouts   [] Seizures   [] History of stroke   [] History of TIA  [] Aphasia   [] Temporary blindness   [] Dysphagia   [] Weakness or numbness in arms   [] Weakness or numbness in legs Musculoskeletal:  [x] Arthritis   [] Joint swelling   [] Joint pain   [x] Low back pain Hematologic:  [] Easy bruising  [] Easy bleeding   [] Hypercoagulable state   [] Anemic  [] Hepatitis Gastrointestinal:  [] Blood in stool   [] Vomiting blood  [] Gastroesophageal reflux/heartburn   [] Abdominal pain Genitourinary:  [] Chronic kidney disease   [] Difficult urination  [x] Frequent urination  [] Burning with urination   [] Hematuria Skin:  [] Rashes   [] Ulcers   [] Wounds Psychological:  [x] History of anxiety   []  History of major depression  Physical Examination  BP (!) 152/81 (BP Location: Left Arm, Patient Position: Sitting, Cuff Size: Small)   Pulse 63   Resp 17   Ht 5\' 3"  (1.6 m)   Wt 119 lb 12.8 oz (54.3 kg)   BMI 21.22 kg/m  Gen:  WD/WN, NAD. Appears far younger than stated age. Head: [place]/AT, No temporalis wasting. Ear/Nose/Throat: Hearing diminished, nares w/o erythema or drainage Eyes: Conjunctiva clear. Sclera non-icteric Neck: Supple.  Trachea midline Pulmonary:  Good air movement, no use of accessory muscles.  Cardiac: RRR, no JVD Vascular:  Vessel Right Left  Radial Palpable Palpable                                   Gastrointestinal: soft, non-tender/non-distended. No guarding/reflex. Increased aortic impulse Musculoskeletal: M/S 5/5 throughout.  No deformity or atrophy. No edema. Neurologic: Sensation grossly intact in extremities.  Symmetrical.  Speech is fluent.  Psychiatric: Judgment intact, Mood &  affect appropriate for pt's clinical situation. Dermatologic: No rashes or ulcers noted.  No cellulitis or open wounds.      Labs No results found for this or any previous visit (from the past 2160 hours).  Radiology CT Angio Abd/Pel w/ and/or w/o Result Date: 12/03/2023 CLINICAL DATA:  Abdominal aortic aneurysm EXAM: CTA ABDOMEN AND PELVIS WITHOUT AND WITH CONTRAST TECHNIQUE: Multidetector CT imaging of  the abdomen and pelvis was performed using the standard protocol during bolus administration of intravenous contrast. Multiplanar reconstructed images and MIPs were obtained and reviewed to evaluate the vascular anatomy. RADIATION DOSE REDUCTION: This exam was performed according to the departmental dose-optimization program which includes automated exposure control, adjustment of the mA and/or kV according to patient size and/or use of iterative reconstruction technique. CONTRAST:  75mL OMNIPAQUE  IOHEXOL  350 MG/ML SOLN COMPARISON:  CT of the abdomen pelvis performed November 26, 2022 FINDINGS: VASCULAR Abdominal aortic measurements were obtained using 3D measurement tools. Aorta: A bilobed tortuous Peri renal and infrarenal abdominal aortic aneurysm is present. The aneurysmal component just inferior to the left main renal artery measures approximately 5.3 x 5.1 cm. Just proximal to the aortic bifurcation, measures 4.3 x 4.1 cm. Celiac: Patent SMA: Patent Renals: Patent IMA: Pain Inflow: The right common iliac artery is ectatic. The distal diameter is estimated at 15 mm. The right internal iliac artery is patent. The right external iliac artery is tortuous, and patent. The left common iliac artery is ectatic with mild diffuse atherosclerotic change. Left internal iliac artery is patent. The left external iliac artery is tortuous, and patent. Proximal Outflow: Both common femoral arteries are patent with mild atherosclerotic changes. Veins: No obvious venous abnormality within the limitations of this arterial  phase study. Review of the MIP images confirms the above findings. NON-VASCULAR Lower chest: Reported separately. Hepatobiliary: Nothing significant. Pancreas: Unchanged. Spleen: Spleen size is within normal limits. Adrenals/Urinary Tract: Cortical thinning is present in both kidneys. No hydronephrosis. Urinary bladder is grossly unremarkable. Stomach/Bowel: No dilated loops of bowel are appreciated. Hiatal hernia. A moderate volume of stool material is present throughout the colon. Lymphatic: Nothing significant. Reproductive: Uterus and bilateral adnexa are unremarkable. Other: Nothing significant. Musculoskeletal: Diffuse osteopenia with degenerative changes in the imaged osseous structures as well as prior vertebral augmentation changes at L4. IMPRESSION: 1. The peri-renal abdominal aortic aneurysm measures 5.3 x 5.1 cm using 3D measurement techniques. 2. Infrarenal aneurysm component, proximal to the aortic bifurcation, measures 4.3 cm. 3. There is significant tortuosity and angulation to the abdominal aorta which is similar when compared to the previous exam. 4. Recommend follow-up CT or MR as appropriate in 6 months and referral to or continued care with vascular specialist. (Ref.: J Vasc Surg. 2018; 67:2-77 and J Am Coll Radiol 2013;10(10):789-794.) Electronically Signed   By: Reagan Camera M.D.   On: 12/03/2023 09:46   CT ANGIO CHEST AORTA W/CM & OR WO/CM Result Date: 12/03/2023 CLINICAL DATA:  Thoracic aortic aneurysm follow-up EXAM: CT ANGIOGRAPHY CHEST WITH CONTRAST TECHNIQUE: Multidetector CT imaging of the chest was performed using the standard protocol during bolus administration of intravenous contrast. Multiplanar CT image reconstructions and MIPs were obtained to evaluate the vascular anatomy. Multiplanar image (3D post-processing) reconstructions and MIPs were obtained to evaluate the vascular anatomy. RADIATION DOSE REDUCTION: This exam was performed according to the departmental  dose-optimization program which includes automated exposure control, adjustment of the mA and/or kV according to patient size and/or use of iterative reconstruction technique. CONTRAST:  75mL OMNIPAQUE  IOHEXOL  350 MG/ML SOLN COMPARISON:  None Available. FINDINGS: Cardiovascular: Heart is enlarged. Atherosclerotic changes are present in the left coronary territory. Motion limits right coronary territory evaluation. Two vessel aortic arch. Proximal arch vessels are ectatic, but patent. Sinus of Valsalva: 32 x 35 x 31 mm Sinotubular junction: 33 x 31 mm Tubular ascending thoracic aorta: 41 x 40 mm in the distal ascending segment. Aortic arch: 31 x  30 mm Proximal descending thoracic aorta: 40 x 33 mm Descending thoracic aorta at the level of the aortic hiatus: 30 x 30 mm There is considerable ectasia/tortuosity of the descending thoracic segment. Main pulmonary artery: The main pulmonary artery as well as left and right main branches are mildly dilated. Mediastinum/Nodes: No enlarged mediastinal, hilar, or axillary lymph nodes. Thyroid  gland, trachea, and esophagus demonstrate no significant findings. Lungs/Pleura: No pleural effusion or pneumothorax. A noncalcified 6 mm right lower lobe pulmonary nodule is present on image 71 of series 4. Upper Abdomen: Reported separately. Musculoskeletal: Post treatment changes from vertebral augmentation of prior thoracic compression fractures seen at multiple levels. Diffuse osteopenia. Review of the MIP images confirms the above findings. IMPRESSION: 1. The tubular ascending thoracic aorta measures 4.1 cm in the distal ascending segment. 2. Recommend annual imaging followup by CTA or MRA. This recommendation follows 2010 ACCF/AHA/AATS/ACR/ASA/SCA/SCAI/SIR/STS/SVM Guidelines for the Diagnosis and Management of Patients with Thoracic Aortic Disease. Circulation. 2010; 121: Z610-R604. Aortic aneurysm NOS (ICD10-I71.9) 3. A noncalcified right lower lobe pulmonary nodule is present.  Follow-up non-contrast CT recommended at 3-6 months to confirm persistence. If unchanged, and solid component remains <6 mm, annual CT is recommended until 5 years of stability has been established. If persistent these nodules should be considered highly suspicious if the solid component of the nodule is 6 mm or greater in size and enlarging. This recommendation follows the consensus statement: Guidelines for Management of Incidental Pulmonary Nodules Detected on CT Images: From the Fleischner Society 2017; Radiology 2017; 284:228-243. 4. Significant ectasia/tortuosity of the descending thoracic aorta is present. The largest descending diameter is approximately 4.0 cm. 5. Enlarged heart. 6. Dilated main pulmonary artery which can be observed in the setting of pulmonary arterial hypertension. Electronically Signed   By: Reagan Camera M.D.   On: 12/03/2023 09:27    Assessment/Plan  AAA (abdominal aortic aneurysm) without rupture [facility]) She underwent a recent CT scan of the abdomen pelvis which I have independently reviewed.  The more superior portion of this aneurysm is in the juxtarenal location.  I measured this at approximately 5.2 cm in maximal diameter.  This has grown some from her previous size of under 5 cm.  In the more distal infrarenal portion, this measures approximately 4 cm in maximal diameter.  No evidence of extravasation or dissection. I had a long discussion with she and her daughter today.  She will be 89 later this month.  This would be an extremely complex repair requiring a branch or fenestrated stent graft.  I would not consider repair for this until she reached at least 5.5 cm in maximal diameter.  If it does come to repair, I think she would benefit from seeing Dr. Myles Arvin at [facility] who is one of the world's experts in branched and fenestrated stent graft technology.  For now, we will continue to monitor this and do CT scans.  I will plan to see her back in 6 months.  Blood pressure control  would be very important for avoiding sac growth.  Essential hypertension blood pressure control important in reducing the progression of atherosclerotic disease and aneurysmal degeneration. On appropriate oral medications.     Hyperlipidemia lipid control important in reducing the progression of atherosclerotic disease. Continue statin therapy     Chronic low back pain (Bilateral) (R>L) This not likely from her aneurysm   CKD (chronic kidney disease) stage 3, GFR 30-59 ml/min Sees nephrology.    Mikki Alexander, MD  12/19/2023 1:10 PM  This note was created with Dragon medical transcription system.  Any errors from dictation are purely unintentional

## 2023-12-19 NOTE — Assessment & Plan Note (Signed)
 She underwent a recent CT scan of the abdomen pelvis which I have independently reviewed.  The more superior portion of this aneurysm is in the juxtarenal location.  I measured this at approximately 5.2 cm in maximal diameter.  This has grown some from her previous size of under 5 cm.  In the more distal infrarenal portion, this measures approximately 4 cm in maximal diameter.  No evidence of extravasation or dissection. I had a long discussion with she and her daughter today.  She will be 89 later this month.  This would be an extremely complex repair requiring a branch or fenestrated stent graft.  I would not consider repair for this until she reached at least 5.5 cm in maximal diameter.  If it does come to repair, I think she would benefit from seeing Dr. Myles Arvin at [facility] who is one of the world's experts in branched and fenestrated stent graft technology.  For now, we will continue to monitor this and do CT scans.  I will plan to see her back in 6 months.  Blood pressure control would be very important for avoiding sac growth.

## 2023-12-31 ENCOUNTER — Other Ambulatory Visit: Payer: Self-pay | Admitting: Family Medicine

## 2023-12-31 DIAGNOSIS — F5101 Primary insomnia: Secondary | ICD-10-CM

## 2024-01-01 NOTE — Telephone Encounter (Signed)
 Courtesy refill. Patient will need an office visit for additional refills.  Requested Prescriptions  Pending Prescriptions Disp Refills   traZODone  (DESYREL ) 50 MG tablet [Pharmacy Med Name: TRAZODONE  HCL 50 MG TAB] 30 tablet 0    Sig: TAKE 1 TABLET BY MOUTH AT BEDTIME     Psychiatry: Antidepressants - Serotonin Modulator Failed - 01/01/2024  1:14 PM      Failed - Valid encounter within last 6 months    Recent Outpatient Visits   None     Future Appointments             In 5 months MacDiarmid, Geralyn Knee, MD [facility] Urology [facility]

## 2024-01-06 ENCOUNTER — Ambulatory Visit: Admitting: Pain Medicine

## 2024-01-06 ENCOUNTER — Ambulatory Visit: Attending: Pain Medicine | Admitting: Pain Medicine

## 2024-01-06 ENCOUNTER — Encounter: Payer: Self-pay | Admitting: Pain Medicine

## 2024-01-06 ENCOUNTER — Ambulatory Visit
Admission: RE | Admit: 2024-01-06 | Discharge: 2024-01-06 | Disposition: A | Discharge status: Home / Self Care | Source: Ambulatory Visit | Attending: Pain Medicine | Admitting: Pain Medicine

## 2024-01-06 VITALS — BP 164/82 | HR 55 | Temp 97.3°F | Resp 9 | Ht 63.0 in | Wt 120.0 lb

## 2024-01-06 DIAGNOSIS — M51371 Other intervertebral disc degeneration, lumbosacral region with lower extremity pain only: Secondary | ICD-10-CM | POA: Diagnosis present

## 2024-01-06 DIAGNOSIS — R937 Abnormal findings on diagnostic imaging of other parts of musculoskeletal system: Secondary | ICD-10-CM | POA: Diagnosis not present

## 2024-01-06 DIAGNOSIS — M4317 Spondylolisthesis, lumbosacral region: Secondary | ICD-10-CM | POA: Diagnosis not present

## 2024-01-06 DIAGNOSIS — G8929 Other chronic pain: Secondary | ICD-10-CM | POA: Insufficient documentation

## 2024-01-06 DIAGNOSIS — M79604 Pain in right leg: Secondary | ICD-10-CM | POA: Insufficient documentation

## 2024-01-06 DIAGNOSIS — M48062 Spinal stenosis, lumbar region with neurogenic claudication: Secondary | ICD-10-CM | POA: Diagnosis not present

## 2024-01-06 DIAGNOSIS — M5441 Lumbago with sciatica, right side: Secondary | ICD-10-CM | POA: Diagnosis not present

## 2024-01-06 DIAGNOSIS — M25551 Pain in right hip: Secondary | ICD-10-CM | POA: Diagnosis not present

## 2024-01-06 DIAGNOSIS — M48061 Spinal stenosis, lumbar region without neurogenic claudication: Secondary | ICD-10-CM | POA: Insufficient documentation

## 2024-01-06 DIAGNOSIS — S22080S Wedge compression fracture of T11-T12 vertebra, sequela: Secondary | ICD-10-CM | POA: Insufficient documentation

## 2024-01-06 MED ORDER — LIDOCAINE HCL 2 % IJ SOLN
20.0000 mL | Freq: Once | INTRAMUSCULAR | Status: AC
  Administered 2024-01-06: 200 mg
  Filled 2024-01-06: qty 40

## 2024-01-06 MED ORDER — SODIUM CHLORIDE (PF) 0.9 % IJ SOLN
INTRAMUSCULAR | Status: AC
  Filled 2024-01-06: qty 10

## 2024-01-06 MED ORDER — SODIUM CHLORIDE 0.9% FLUSH
2.0000 mL | Freq: Once | INTRAVENOUS | Status: AC
  Administered 2024-01-06: 2 mL

## 2024-01-06 MED ORDER — PENTAFLUOROPROP-TETRAFLUOROETH EX AERO: INHALATION_SPRAY | Freq: Once | CUTANEOUS | Status: DC

## 2024-01-06 MED ORDER — TRIAMCINOLONE ACETONIDE 40 MG/ML IJ SUSP
40.0000 mg | Freq: Once | INTRAMUSCULAR | Status: AC
  Administered 2024-01-06: 40 mg
  Filled 2024-01-06: qty 1

## 2024-01-06 MED ORDER — IOHEXOL 180 MG/ML  SOLN
10.0000 mL | Freq: Once | INTRAMUSCULAR | Status: AC
  Administered 2024-01-06: 10 mL via EPIDURAL
  Filled 2024-01-06: qty 20

## 2024-01-06 MED ORDER — ROPIVACAINE HCL 2 MG/ML IJ SOLN
2.0000 mL | Freq: Once | INTRAMUSCULAR | Status: AC
  Administered 2024-01-06: 2 mL via EPIDURAL
  Filled 2024-01-06: qty 20

## 2024-01-06 NOTE — Patient Instructions (Signed)

## 2024-01-06 NOTE — Progress Notes (Signed)
 PROVIDER NOTE: Interpretation of information contained herein should be left to medically-trained personnel. Specific patient instructions are provided elsewhere under "Patient Instructions" section of medical record. This document was created in part using STT-dictation technology, any transcriptional errors that may result from this process are unintentional.  Patient: Brittney Simpson Type: Established DOB: Nov 18, 1927 MRN: [id number] PCP: Brittney Bunting, DO  Service: Procedure DOS: 01/06/2024 Setting: Ambulatory Location: Ambulatory outpatient facility Delivery: Face-to-face Provider: Candi Chafe, MD Specialty: Interventional Pain Management Specialty designation: 09 Location: Outpatient facility Ref. Prov.: Brittney Simpson *       Interventional Therapy   Type: Lumbar epidural steroid injection (LESI) (interlaminar) #12    Laterality: Right   Level:  L1-2 Level.  Imaging: Fluoroscopic guidance         Anesthesia: Local anesthesia (1-2% Lidocaine ) Anxiolysis: None                 Sedation: No Sedation                       DOS: 01/06/2024  Performed by: Brittney Chafe, MD  Purpose: Diagnostic/Therapeutic Indications: Lumbar radicular pain of intraspinal etiology of more than 4 weeks that has failed to respond to conservative therapy and is severe enough to impact quality of life or function. 1. Chronic low back pain (Right) w/ radicular pain (Right)   2. Chronic lower extremity pain (3ry area of Pain) (Right)   3. Degeneration of intervertebral disc of lumbosacral region with lower extremity pain   4. Lumbar lateral recess stenosis   5. Lumbar foraminal stenosis   6. Spinal stenosis of lumbar region with neurogenic claudication   7. T12 compression fracture, sequela   8. Spondylolisthesis of lumbosacral region (L2-3 and L5-S1)   9. Chronic hip pain (2ry area of Pain) (Right)   10. Abnormal MRI, lumbar spine (02/02/2020)    NAS-11 Pain score:    Pre-procedure: 9 /10   Post-procedure: 0-No pain/10      Position / Prep / Materials:  Position: Prone w/ head of the table raised (slight reverse trendelenburg) to facilitate breathing.  Prep solution: ChloraPrep (2% chlorhexidine  gluconate and 70% isopropyl alcohol) Prep Area: Entire Posterior Lumbar Region from lower scapular tip down to mid buttocks area and from flank to flank. Materials:  Tray: Epidural tray Needle(s):  Type: Epidural needle (Tuohy) Gauge (G):  17 Length: Regular (3.5-in) Qty: 1  H&P (Pre-op Assessment):  Brittney Simpson is a 89 y.o. (year old), female patient, seen today for interventional treatment. She  has a past surgical history that includes Hernia repair; Hernia repair; Hemorrhoid surgery; Kyphoplasty (N/A, 01/23/2017); Kyphoplasty (N/A, 01/25/2020); and Cholecystectomy. Brittney Simpson has a current medication list which includes the following prescription(s): acetaminophen , amlodipine , brimonidine, vitamin d3, clonazepam , cranberry, furosemide , latanoprost, levothyroxine , lisinopril , melatonin, metoprolol  succinate, omeprazole , tamsulosin , tramadol , and trazodone , and the following Facility-Administered Medications: pentafluoroprop-tetrafluoroeth. Her primarily concern today is the Back Pain (lower)  Initial Vital Signs:  Pulse/HCG Rate: (!) 55ECG Heart Rate: 64 Temp: (!) 97.3 F (36.3 C) Resp: 16 BP: 128/75 SpO2: 95 %  BMI: Estimated body mass index is 21.26 kg/m as calculated from the following:   Height as of this encounter: 5\' 3"  (1.6 m).   Weight as of this encounter: 120 lb (54.4 kg).  Risk Assessment: Allergies: Reviewed. She has no known allergies.  Allergy Precautions: None required Coagulopathies: Reviewed. None identified.  Blood-thinner therapy: None at this time Active Infection(s): Reviewed. None identified. Brittney Simpson is  afebrile  Site Confirmation: Brittney Simpson was asked to confirm the procedure and laterality before marking the  site Procedure checklist: Completed Consent: Before the procedure and under the influence of no sedative(s), amnesic(s), or anxiolytics, the patient was informed of the treatment options, risks and possible complications. To fulfill our ethical and legal obligations, as recommended by the American Medical Association's Code of Ethics, I have informed the patient of my clinical impression; the nature and purpose of the treatment or procedure; the risks, benefits, and possible complications of the intervention; the alternatives, including doing nothing; the risk(s) and benefit(s) of the alternative treatment(s) or procedure(s); and the risk(s) and benefit(s) of doing nothing. The patient was provided information about the general risks and possible complications associated with the procedure. These may include, but are not limited to: failure to achieve desired goals, infection, bleeding, organ or nerve damage, allergic reactions, paralysis, and death. In addition, the patient was informed of those risks and complications associated to Spine-related procedures, such as failure to decrease pain; infection (i.e.: Meningitis, epidural or intraspinal abscess); bleeding (i.e.: epidural hematoma, subarachnoid hemorrhage, or any other type of intraspinal or peri-dural bleeding); organ or nerve damage (i.e.: Any type of peripheral nerve, nerve root, or spinal cord injury) with subsequent damage to sensory, motor, and/or autonomic systems, resulting in permanent pain, numbness, and/or weakness of one or several areas of the body; allergic reactions; (i.e.: anaphylactic reaction); and/or death. Furthermore, the patient was informed of those risks and complications associated with the medications. These include, but are not limited to: allergic reactions (i.e.: anaphylactic or anaphylactoid reaction(s)); adrenal axis suppression; blood sugar elevation that in diabetics may result in ketoacidosis or comma; water retention  that in patients with history of congestive heart failure may result in shortness of breath, pulmonary edema, and decompensation with resultant heart failure; weight gain; swelling or edema; medication-induced neural toxicity; particulate matter embolism and blood vessel occlusion with resultant organ, and/or nervous system infarction; and/or aseptic necrosis of one or more joints. Finally, the patient was informed that Medicine is not an exact science; therefore, there is also the possibility of unforeseen or unpredictable risks and/or possible complications that may result in a catastrophic outcome. The patient indicated having understood very clearly. We have given the patient no guarantees and we have made no promises. Enough time was given to the patient to ask questions, all of which were answered to the patient's satisfaction. Ms. Deshmukh has indicated that she wanted to continue with the procedure. Attestation: I, the ordering provider, attest that I have discussed with the patient the benefits, risks, side-effects, alternatives, likelihood of achieving goals, and potential problems during recovery for the procedure that I have provided informed consent. Date  Time: 01/06/2024  8:07 AM  Pre-Procedure Preparation:  Monitoring: As per clinic protocol. Respiration, ETCO2, SpO2, BP, heart rate and rhythm monitor placed and checked for adequate function Safety Precautions: Patient was assessed for positional comfort and pressure points before starting the procedure. Time-out: I initiated and conducted the "Time-out" before starting the procedure, as per protocol. The patient was asked to participate by confirming the accuracy of the "Time Out" information. Verification of the correct person, site, and procedure were performed and confirmed by me, the nursing staff, and the patient. "Time-out" conducted as per Joint Commission's Universal Protocol (UP.01.01.01). Time: 0830 Start Time: 0830  hrs.  Description/Narrative of Procedure:          Target: Epidural space via interlaminar opening, initially targeting the lower laminar border  of the superior vertebral body. Region: Lumbar Approach: Percutaneous paravertebral  Rationale (medical necessity): procedure needed and proper for the diagnosis and/or treatment of the patient's medical symptoms and needs. Procedural Technique Safety Precautions: Aspiration looking for blood return was conducted prior to all injections. At no point did we inject any substances, as a needle was being advanced. No attempts were made at seeking any paresthesias. Safe injection practices and needle disposal techniques used. Medications properly checked for expiration dates. SDV (single dose vial) medications used. Description of the Procedure: Protocol guidelines were followed. The procedure needle was introduced through the skin, ipsilateral to the reported pain, and advanced to the target area. Bone was contacted and the needle walked caudad, until the lamina was cleared. The epidural space was identified using "loss-of-resistance technique" with 2-3 ml of PF-NaCl (0.9% NSS), in a 5cc LOR glass syringe.  Vitals:   01/06/24 0830 01/06/24 0835 01/06/24 0840 01/06/24 0843  BP: (!) 160/90 (!) 150/78 (!) 164/84 (!) 164/82  Pulse:      Resp: 14 12 10  (!) 9  Temp:      TempSrc:      SpO2: 96% 96% 96% 99%  Weight:      Height:        Start Time: 0830 hrs. End Time: 0842 hrs.  Imaging Guidance (Spinal):          Type of Imaging Technique: Fluoroscopy Guidance (Spinal) Indication(s): Fluoroscopy guidance for needle placement to enhance accuracy in procedures requiring precise needle localization for targeted delivery of medication in or near specific anatomical locations not easily accessible without such real-time imaging assistance. Exposure Time: Please see nurses notes. Contrast: Before injecting any contrast, we confirmed that the patient did not  have an allergy to iodine, shellfish, or radiological contrast. Once satisfactory needle placement was completed at the desired level, radiological contrast was injected. Contrast injected under live fluoroscopy. No contrast complications. See chart for type and volume of contrast used. Fluoroscopic Guidance: I was personally present during the use of fluoroscopy. "Tunnel Vision Technique" used to obtain the best possible view of the target area. Parallax error corrected before commencing the procedure. "Direction-depth-direction" technique used to introduce the needle under continuous pulsed fluoroscopy. Once target was reached, antero-posterior, oblique, and lateral fluoroscopic projection used confirm needle placement in all planes. Images permanently stored in EMR. Interpretation: I personally interpreted the imaging intraoperatively. Adequate needle placement confirmed in multiple planes. Appropriate spread of contrast into desired area was observed. No evidence of afferent or efferent intravascular uptake. No intrathecal or subarachnoid spread observed. Permanent images saved into the patient's record.  Antibiotic Prophylaxis:   Anti-infectives (From admission, onward)    None      Indication(s): None identified   Post-operative Assessment:  Post-procedure Vital Signs:  Pulse/HCG Rate: (!) 5563 Temp: (!) 97.3 F (36.3 C) Resp: (!) 9 BP: (!) 164/82 SpO2: 99 %  EBL: None  Complications: No immediate post-treatment complications observed by team, or reported by patient.  Note: The patient tolerated the entire procedure well. A repeat set of vitals were taken after the procedure and the patient was kept under observation following institutional policy, for this type of procedure. Post-procedural neurological assessment was performed, showing return to baseline, prior to discharge. The patient was provided with post-procedure discharge instructions, including a section on how to identify  potential problems. Should any problems arise concerning this procedure, the patient was given instructions to immediately contact us , at any time, without hesitation. In any case, we  plan to contact the patient by telephone for a follow-up status report regarding this interventional procedure.  Comments:  No additional relevant information.  Plan of Care (POC)  Orders:  Orders Placed This Encounter  Procedures   Lumbar Epidural Injection    Scheduling Instructions:     Procedure: Interlaminar LESI L1-2     Laterality: Right     Sedation: No Sedation     Date: 01/06/2024    Where will this procedure be performed?:   [facility]   [facility] C-ARM 1-60 MIN NO REPORT    Intraoperative interpretation by procedural physician at [facility].    Standing Status:   Standing    Number of Occurrences:   1    Reason for exam::   Assistance in needle guidance and placement for procedures requiring needle placement in or near specific anatomical locations not easily accessible without such assistance.   Informed Consent Details: Physician/Practitioner Attestation; Transcribe to consent form and obtain patient signature    Note: Always confirm laterality of pain with Ms. Heal, before procedure. Transcribe to consent form and obtain patient signature.    Physician/Practitioner attestation of informed consent for procedure/surgical case:   I, the physician/practitioner, attest that I have discussed with the patient the benefits, risks, side effects, alternatives, likelihood of achieving goals and potential problems during recovery for the procedure that I have provided informed consent.    Procedure:   Lumbar epidural steroid injection under fluoroscopic guidance    Physician/Practitioner performing the procedure:   Dajon Lazar A. Barth Borne, MD    Indication/Reason:   Low back and/or lower extremity pain secondary to lumbar radiculitis   Provide equipment / supplies at bedside     Procedural tray: Epidural Tray (Disposable  single use) Skin infiltration needle: Regular 1.5-in, 25-G, (x1) Block needle size: Regular standard Catheter: No catheter required    Standing Status:   Standing    Number of Occurrences:   1    Specify:   Epidural Tray     Tramadol  50 mg, 1 tab PO q 8 hrs (150 mg/day of tramadol ) MME/day: 15 mg/day.    Medications ordered for procedure: Meds ordered this encounter  Medications   iohexol  (OMNIPAQUE ) 180 MG/ML injection 10 mL    Must be Myelogram-compatible. If not available, you may substitute with a water-soluble, non-ionic, hypoallergenic, myelogram-compatible radiological contrast medium.   lidocaine  (XYLOCAINE ) 2 % (with pres) injection 400 mg   pentafluoroprop-tetrafluoroeth (GEBAUERS) aerosol   sodium chloride  flush (NS) 0.9 % injection 2 mL   ropivacaine  (PF) 2 mg/mL (0.2%) (NAROPIN ) injection 2 mL   triamcinolone  acetonide (KENALOG -40) injection 40 mg   Medications administered: We administered iohexol , lidocaine , sodium chloride  flush, ropivacaine  (PF) 2 mg/mL (0.2%), and triamcinolone  acetonide.  See the medical record for exact dosing, route, and time of administration.    Interventional Therapies  Risk  Complexity Considerations:   Advanced age  HOH  AAA  HTN  Stage3 CKD  GERD   Note: Hard of hearing   Planned  Pending:      Under consideration:   (PRN) palliative right L1-2 LESI #12    Completed:   Palliative right L1-2 LESI x11 (09/23/2023) (100/100/80/70-75)  Therapeutic right L2-3 LESI x1 (03/07/2020) (100/100/25/<50)  Diagnostic/therapeutic bilateral SI joint injection x1 (02/10/2020) (100/100/100 x2 days/0)  Palliative right lumbar facet MBB x13 (01/04/2020) (8 to 0) (100/100/100)  Palliative left lumbar facet MBB x11 (01/04/2020) (8 to 0) (100/100/100)    Therapeutic  Palliative (  PRN) options:   Palliative right T12-L1 LESI (PRN)   Pharmacotherapy  Nonopioids transferred 04/11/2020: Robaxin         Follow-up plan:   Return in 2 weeks (on 01/20/2024) for (Face2F), (PPE).     Recent Visits Date Type Provider Dept  10/09/23 Office Visit Renaldo Caroli, MD [facility]  Showing recent visits within past 90 days and meeting all other requirements Today's Visits Date Type Provider Dept  01/06/24 Procedure visit Renaldo Caroli, MD [facility]  Showing today's visits and meeting all other requirements Future Appointments Date Type Provider Dept  01/21/24 Appointment Renaldo Caroli, MD [facility]  03/31/24 Appointment Renaldo Caroli, MD [facility]  Showing future appointments within next 90 days and meeting all other requirements   Disposition: Discharge home  Discharge (Date  Time): 01/06/2024; 0848 hrs.   Primary Care Physician: Brittney Bunting, DO Location: [facility] Note by: Brittney Chafe, MD (TTS technology used. I apologize for any typographical errors that were not detected and corrected.) Date: 01/06/2024; Time: 9:05 AM  Disclaimer:  Medicine is not an Visual merchandiser. The only guarantee in medicine is that nothing is guaranteed. It is important to note that the decision to proceed with this intervention was based on the information collected from the patient. The Data and conclusions were drawn from the patient's questionnaire, the interview, and the physical examination. Because the information was provided in large part by the patient, it cannot be guaranteed that it has not been purposely or unconsciously manipulated. Every effort has been made to obtain as much relevant data as possible for this evaluation. It is important to note that the conclusions that lead to this procedure are derived in large part from the available data. Always take into account that the treatment will also be dependent on availability of resources and existing treatment guidelines, considered by other  Pain Management Practitioners as being common knowledge and practice, at the time of the intervention. For Medico-Legal purposes, it is also important to point out that variation in procedural techniques and pharmacological choices are the acceptable norm. The indications, contraindications, technique, and results of the above procedure should only be interpreted and judged by a Board-Certified Interventional Pain Specialist with extensive familiarity and expertise in the same exact procedure and technique.

## 2024-01-07 ENCOUNTER — Telehealth: Payer: Self-pay | Admitting: *Deleted

## 2024-01-07 NOTE — Telephone Encounter (Signed)
 Attempted to call daughter Elsa Halls for post procedure follow-up. Message left.

## 2024-01-18 NOTE — Progress Notes (Unsigned)
 PROVIDER NOTE: Interpretation of information contained herein should be left to medically-trained personnel. Specific patient instructions are provided elsewhere under Patient Instructions section of medical record. This document was created in part using AI and STT-dictation technology, any transcriptional errors that may result from this process are unintentional.  Patient: Brittney Simpson  Service: E/M   PCP: Edman Marsa PARAS, DO  DOB: Apr 09, 1928  DOS: 01/21/2024  Provider: Eric DELENA Como, MD  MRN: [id number]  Delivery: Virtual Visit  Specialty: Interventional Pain Management  Type: Established Patient  Setting: Ambulatory outpatient facility  Specialty designation: 09  Referring Prov.: Edman Marsa *  Location: Remote location       Virtual Encounter - Pain Management PROVIDER NOTE: Information contained herein reflects review and annotations entered in association with encounter. Interpretation of such information and data should be left to medically-trained personnel. Information provided to patient can be located elsewhere in the medical record under Patient Instructions. Document created using STT-dictation technology, any transcriptional errors that may result from process are unintentional.    Contact & Pharmacy Preferred: [phone] Home: [phone] (home) Mobile: [phone] (mobile) E-mail: [email or web address]  [facility] [place], [place] - [address] [place] [address] Phone: [phone] Fax: [phone]   Pre-screening  Brittney Simpson in-person vs virtual encounter. She indicated preferring virtual for this encounter.   Reason COVID-19*  Social distancing based on CDC and AMA recommendations.   I contacted Brittney Simpson on 01/21/2024 via telephone.      I clearly identified myself as Eric DELENA Como, MD. I verified that I was speaking with the correct person using two identifiers (Name: Brittney Simpson, and date of birth: [birth year]).  Consent I sought verbal advanced consent from Brittney Simpson for virtual visit interactions. I informed Brittney Simpson of possible security and privacy concerns, risks, and limitations associated with providing not-in-person medical evaluation and management services. I also informed Brittney Simpson of the availability of in-person appointments. Finally, I informed her that there would be a charge for the virtual visit and that she could be  personally, fully or partially, financially responsible for it. Brittney Simpson expressed understanding and agreed to proceed.   Historic Elements   Brittney Simpson is a 89 y.o. year old, female patient evaluated today after our last contact on 01/06/2024. Brittney Simpson  has a past medical history of Anxiety, Arthritis, Back ache, Hiatal hernia, Hyperlipidemia, Hypertension, Hypothyroid, Osteopenia, Thyroid  disease, and UTI (urinary tract infection). She also  has a past surgical history that includes Hernia repair; Hernia repair; Hemorrhoid surgery; Kyphoplasty (N/A, 01/23/2017); Kyphoplasty (N/A, 01/25/2020); and Cholecystectomy. Brittney Simpson has a current medication list which includes the following prescription(s): acetaminophen , amlodipine , brimonidine, vitamin d3, clonazepam , cranberry, furosemide , latanoprost, levothyroxine , lisinopril , melatonin, metoprolol  succinate, omeprazole , tamsulosin , tramadol , and trazodone . She  reports that she has never smoked. She has never used smokeless tobacco. She reports that she does not drink alcohol and does not use drugs. Brittney Simpson has no known allergies.  BMI: Estimated body mass index is 21.26 kg/m as calculated from the following:   Height as of 01/06/24: 5' 3 (1.6 m).   Weight as of 01/06/24: 120 lb (54.4 kg). Last encounter: 10/09/2023. Last procedure: 01/06/2024.  HPI  Today, she is being contacted for a post-procedure assessment.  Post-Procedure Evaluation    Type: Lumbar epidural steroid injection (LESI) (interlaminar) #12    Laterality: Right   Level:  L1-2 Level.  Imaging: Fluoroscopic guidance  Anesthesia: Local anesthesia (1-2% Lidocaine ) Anxiolysis: None                 Sedation: No Sedation                       DOS: 01/06/2024  Performed by: Eric DELENA Como, MD  Purpose: Diagnostic/Therapeutic Indications: Lumbar radicular pain of intraspinal etiology of more than 4 weeks that has failed to respond to conservative therapy and is severe enough to impact quality of life or function. 1. Chronic low back pain (Right) w/ radicular pain (Right)   2. Chronic lower extremity pain (3ry area of Pain) (Right)   3. Degeneration of intervertebral disc of lumbosacral region with lower extremity pain   4. Lumbar lateral recess stenosis   5. Lumbar foraminal stenosis   6. Spinal stenosis of lumbar region with neurogenic claudication   7. T12 compression fracture, sequela   8. Spondylolisthesis of lumbosacral region (L2-3 and L5-S1)   9. Chronic hip pain (2ry area of Pain) (Right)   10. Abnormal MRI, lumbar spine (02/02/2020)    NAS-11 Pain score:   Pre-procedure: 9 /10   Post-procedure: 0-No pain/10     Effectiveness:  Initial hour after procedure:   ***. Subsequent 4-6 hours post-procedure:   ***. Analgesia past initial 6 hours:   ***. Ongoing improvement:  Analgesic:  *** Function:    ***    ROM:    ***     Pharmacotherapy Assessment Opioid Analgesic: Tramadol  50 mg, 1 tab PO q 8 hrs (150 mg/day of tramadol ) MME/day: 15 mg/day.   Monitoring: [place] PMP: PDMP reviewed during this encounter.       Pharmacotherapy: No side-effects or adverse reactions reported. Compliance: No problems identified. Effectiveness: Clinically acceptable. Plan: Refer to POC.  UDS:  Summary  Date Value Ref Range Status  04/07/2023 Note  Final    Comment:    ==================================================================== [employer]  13 (MW) ==================================================================== Test                             Result       Flag       Units  Drug Present and Declared for Prescription Verification   7-aminoclonazepam              255          EXPECTED   ng/mg creat    7-aminoclonazepam is an expected metabolite of clonazepam . Source of    clonazepam  is a scheduled prescription medication.    Tramadol                        >25000       EXPECTED   ng/mg creat   O-Desmethyltramadol            >25000       EXPECTED   ng/mg creat   N-Desmethyltramadol            7320         EXPECTED   ng/mg creat    Source of tramadol  is a prescription medication. O-desmethyltramadol    and N-desmethyltramadol are expected metabolites of tramadol .  ==================================================================== Test                      Result    Flag   Units      Ref Range   Creatinine  20               mg/dL      >=79 ==================================================================== Declared Medications:  The flagging and interpretation on this report are based on the  following declared medications.  Unexpected results may arise from  inaccuracies in the declared medications.   **Note: The testing scope of this panel includes these medications:   Clonazepam  (Klonopin )  Tramadol  (Ultram )   **Note: The testing scope of this panel does not include the  following reported medications:   Acetaminophen  (Tylenol )  Amlodipine  (Norvasc )  Cranberry  Eye Drops  Furosemide  (Lasix )  Levothyroxine  (Synthroid )  Lisinopril  (Zestril )  Melatonin  Methocarbamol  (Robaxin )  Metoprolol  (Toprol )  Naloxone  (Narcan )  Omeprazole  (Prilosec)  Tamsulosin  (Flomax )  Trazodone  (Desyrel )  Vitamin D3 ==================================================================== For clinical consultation, please call [phone]. ====================================================================    No  results found for: CBDTHCR, D8THCCBX, D9THCCBX  Laboratory Chemistry Profile   Renal Lab Results  Component Value Date   BUN 9 04/21/2023   CREATININE 0.79 04/21/2023   BCR SEE NOTE: 04/21/2023   GFRAA 76 05/16/2020   GFRNONAA 66 05/16/2020    Hepatic Lab Results  Component Value Date   AST 8 (L) 04/21/2023   ALT 6 04/21/2023   ALBUMIN 3.6 11/22/2016   ALKPHOS 59 11/22/2016   LIPASE 21 06/14/2016    Electrolytes Lab Results  Component Value Date   NA 137 04/21/2023   K 4.2 04/21/2023   CL 99 04/21/2023   CALCIUM 9.3 04/21/2023   MG 2.1 01/14/2014    Bone No results found for: VD25OH, VD125OH2TOT, CI6874NY7, CI7874NY7, 25OHVITD1, 25OHVITD2, 25OHVITD3, TESTOFREE, TESTOSTERONE  Inflammation (CRP: Acute Phase) (ESR: Chronic Phase) No results found for: CRP, ESRSEDRATE, LATICACIDVEN       Note: Above Lab results reviewed.  Imaging  DG PAIN CLINIC C-ARM 1-60 MIN NO REPORT Fluoro was used, but no Radiologist interpretation will be provided.  Please refer to NOTES tab for provider progress note.  Assessment  The primary encounter diagnosis was Chronic low back pain (Right) w/ radicular pain (Right). Diagnoses of Chronic lower extremity pain (3ry area of Pain) (Right), Chronic hip pain (2ry area of Pain) (Right), Degeneration of intervertebral disc of lumbosacral region with lower extremity pain, Lumbar lateral recess stenosis, Lumbar foraminal stenosis, Spinal stenosis of lumbar region with neurogenic claudication, and Postop check were also pertinent to this visit.  Plan of Care  Problem-specific:  No problem-specific Assessment & Plan notes found for this encounter.  Ms. MISKI FELDPAUSCH has a current medication list which includes the following long-term medication(s): amlodipine , clonazepam , furosemide , levothyroxine , lisinopril , metoprolol  succinate, omeprazole , tramadol , and trazodone .  Pharmacotherapy (Medications Ordered): No  orders of the defined types were placed in this encounter.  Orders:  No orders of the defined types were placed in this encounter.  Follow-up plan:   No follow-ups on file.      Interventional Therapies  Risk  Complexity Considerations:   Advanced age  HOH  AAA  HTN  Stage3 CKD  GERD   Note: Hard of hearing   Planned  Pending:      Under consideration:   (PRN) palliative right L1-2 LESI #12    Completed:   Palliative right L1-2 LESI x11 (09/23/2023) (100/100/80/70-75)  Therapeutic right L2-3 LESI x1 (03/07/2020) (100/100/25/<50)  Diagnostic/therapeutic bilateral SI joint injection x1 (02/10/2020) (100/100/100 x2 days/0)  Palliative right lumbar facet MBB x13 (01/04/2020) (8 to 0) (100/100/100)  Palliative left lumbar facet MBB x11 (01/04/2020) (8 to 0) (100/100/100)  Therapeutic  Palliative (PRN) options:   Palliative right T12-L1 LESI (PRN)   Pharmacotherapy  Nonopioids transferred 04/11/2020: Robaxin        Recent Visits Date Type Provider Dept  01/06/24 Procedure visit Tanya Glisson, MD [facility]  Showing recent visits within past 90 days and meeting all other requirements Future Appointments Date Type Provider Dept  01/21/24 Appointment Tanya Glisson, MD [facility]  03/31/24 Appointment Tanya Glisson, MD [facility]  Showing future appointments within next 90 days and meeting all other requirements  I discussed the assessment and treatment plan with the patient. The patient was provided an opportunity to ask questions and all were answered. The patient agreed with the plan and demonstrated an understanding of the instructions.  Patient advised to call back or seek an in-person evaluation if the symptoms or condition worsens.  Duration of encounter: *** minutes.  Note by: Glisson DELENA Tanya, MD Date: 01/21/2024; Time: 3:48 PM

## 2024-01-20 ENCOUNTER — Other Ambulatory Visit: Payer: Self-pay | Admitting: Family Medicine

## 2024-01-20 DIAGNOSIS — I10 Essential (primary) hypertension: Secondary | ICD-10-CM | POA: Diagnosis not present

## 2024-01-20 DIAGNOSIS — F419 Anxiety disorder, unspecified: Secondary | ICD-10-CM

## 2024-01-20 DIAGNOSIS — N183 Chronic kidney disease, stage 3 unspecified: Secondary | ICD-10-CM | POA: Diagnosis not present

## 2024-01-20 DIAGNOSIS — E871 Hypo-osmolality and hyponatremia: Secondary | ICD-10-CM | POA: Diagnosis not present

## 2024-01-20 DIAGNOSIS — R600 Localized edema: Secondary | ICD-10-CM | POA: Diagnosis not present

## 2024-01-20 DIAGNOSIS — N390 Urinary tract infection, site not specified: Secondary | ICD-10-CM | POA: Diagnosis not present

## 2024-01-21 ENCOUNTER — Ambulatory Visit: Attending: Pain Medicine | Admitting: Pain Medicine

## 2024-01-21 DIAGNOSIS — M51371 Other intervertebral disc degeneration, lumbosacral region with lower extremity pain only: Secondary | ICD-10-CM

## 2024-01-21 DIAGNOSIS — M5441 Lumbago with sciatica, right side: Secondary | ICD-10-CM | POA: Diagnosis not present

## 2024-01-21 DIAGNOSIS — M79604 Pain in right leg: Secondary | ICD-10-CM | POA: Diagnosis not present

## 2024-01-21 DIAGNOSIS — G8929 Other chronic pain: Secondary | ICD-10-CM

## 2024-01-21 DIAGNOSIS — Z09 Encounter for follow-up examination after completed treatment for conditions other than malignant neoplasm: Secondary | ICD-10-CM | POA: Diagnosis not present

## 2024-01-21 DIAGNOSIS — M48061 Spinal stenosis, lumbar region without neurogenic claudication: Secondary | ICD-10-CM | POA: Diagnosis not present

## 2024-01-21 DIAGNOSIS — M25551 Pain in right hip: Secondary | ICD-10-CM

## 2024-01-21 DIAGNOSIS — M48062 Spinal stenosis, lumbar region with neurogenic claudication: Secondary | ICD-10-CM

## 2024-01-21 NOTE — Telephone Encounter (Signed)
 Requested medication (s) are due for refill today -yes  Requested medication (s) are on the active medication list -yes  Future visit scheduled -no  Last refill: 07/09/23 #30 5RF  Notes to clinic: non delegated Rx  Requested Prescriptions  Pending Prescriptions Disp Refills   clonazePAM  (KLONOPIN ) 0.5 MG tablet [Pharmacy Med Name: CLONAZEPAM  0.5 MG TAB] 30 tablet     Sig: TAKE 1/2 TABLET BY MOUTH TWICE DAILY     Not Delegated - Psychiatry: Anxiolytics/Hypnotics 2 Failed - 01/21/2024  4:23 PM      Failed - This refill cannot be delegated      Failed - Urine Drug Screen completed in last 360 days      Failed - Valid encounter within last 6 months    Recent Outpatient Visits   None     Future Appointments             In 4 months MacDiarmid, Glendia, MD [facility] [place]            Passed - Patient is not pregnant         Requested Prescriptions  Pending Prescriptions Disp Refills   clonazePAM  (KLONOPIN ) 0.5 MG tablet [Pharmacy Med Name: CLONAZEPAM  0.5 MG TAB] 30 tablet     Sig: TAKE 1/2 TABLET BY MOUTH TWICE DAILY     Not Delegated - Psychiatry: Anxiolytics/Hypnotics 2 Failed - 01/21/2024  4:23 PM      Failed - This refill cannot be delegated      Failed - Urine Drug Screen completed in last 360 days      Failed - Valid encounter within last 6 months    Recent Outpatient Visits   None     Future Appointments             In 4 months MacDiarmid, Glendia, MD [facility] - Patient is not pregnant

## 2024-01-26 DIAGNOSIS — R600 Localized edema: Secondary | ICD-10-CM | POA: Diagnosis not present

## 2024-01-26 DIAGNOSIS — N390 Urinary tract infection, site not specified: Secondary | ICD-10-CM | POA: Diagnosis not present

## 2024-01-26 DIAGNOSIS — N183 Chronic kidney disease, stage 3 unspecified: Secondary | ICD-10-CM | POA: Diagnosis not present

## 2024-01-26 DIAGNOSIS — E871 Hypo-osmolality and hyponatremia: Secondary | ICD-10-CM | POA: Diagnosis not present

## 2024-01-26 DIAGNOSIS — I10 Essential (primary) hypertension: Secondary | ICD-10-CM | POA: Diagnosis not present

## 2024-01-27 ENCOUNTER — Other Ambulatory Visit: Payer: Self-pay | Admitting: Family Medicine

## 2024-01-27 DIAGNOSIS — F5101 Primary insomnia: Secondary | ICD-10-CM

## 2024-01-27 DIAGNOSIS — N183 Hypertensive chronic kidney disease with stage 1 through stage 4 chronic kidney disease, or unspecified chronic kidney disease: Secondary | ICD-10-CM

## 2024-01-29 ENCOUNTER — Telehealth: Payer: Self-pay | Admitting: Family Medicine

## 2024-01-29 DIAGNOSIS — F5101 Primary insomnia: Secondary | ICD-10-CM

## 2024-01-29 DIAGNOSIS — N183 Chronic kidney disease, stage 3 unspecified: Secondary | ICD-10-CM

## 2024-01-29 MED ORDER — FUROSEMIDE 20 MG PO TABS: 30.0000 mg | ORAL_TABLET | Freq: Every day | ORAL | 0 refills | Status: DC

## 2024-01-29 MED ORDER — TRAZODONE HCL 50 MG PO TABS: 50.0000 mg | ORAL_TABLET | Freq: Every day | ORAL | 0 refills | Status: DC

## 2024-01-29 NOTE — Telephone Encounter (Signed)
 Unable to refill per protocol, courtesy refill already given, OV needed. Requested Prescriptions  Pending Prescriptions Disp Refills   furosemide  (LASIX ) 20 MG tablet [Pharmacy Med Name: FUROSEMIDE  20 MG TAB] 135 tablet 0    Sig: TAKE 1 and 1/2 TABLETS BY MOUTH ONCE DAILY     Cardiovascular:  Diuretics - Loop Failed - 01/29/2024 11:56 AM      Failed - K in normal range and within 180 days    Potassium  Date Value Ref Range Status  04/21/2023 4.2 3.5 - 5.3 mmol/L Final  11/24/2014 4.2 mmol/L Final    Comment:    3.5-5.1 NOTE: New Reference Range  10/04/14          Failed - Ca in normal range and within 180 days    Calcium  Date Value Ref Range Status  04/21/2023 9.3 8.6 - 10.4 mg/dL Final   Calcium, Total  Date Value Ref Range Status  11/24/2014 8.9 mg/dL Final    Comment:    1.0-89.6 NOTE: New Reference Range  10/04/14          Failed - Na in normal range and within 180 days    Sodium  Date Value Ref Range Status  04/21/2023 137 135 - 146 mmol/L Final  11/24/2014 131 (L) mmol/L Final    Comment:    135-145 NOTE: New Reference Range  10/04/14          Failed - Cr in normal range and within 180 days    Creat  Date Value Ref Range Status  04/21/2023 0.79 0.60 - 0.95 mg/dL Final         Failed - Cl in normal range and within 180 days    Chloride  Date Value Ref Range Status  04/21/2023 99 98 - 110 mmol/L Final  11/24/2014 97 (L) mmol/L Final    Comment:    101-111 NOTE: New Reference Range  10/04/14          Failed - Mg Level in normal range and within 180 days    Magnesium   Date Value Ref Range Status  01/14/2014 2.1 mg/dL Final    Comment:    8.1-7.5 THERAPEUTIC RANGE: 4-7 mg/dL TOXIC: > 10 mg/dL  -----------------------          Failed - Last BP in normal range    BP Readings from Last 1 Encounters:  01/06/24 (!) 164/82         Failed - Valid encounter within last 6 months    Recent Outpatient Visits   None     Future Appointments              In 4 months MacDiarmid, Glendia, MD [facility] [place]             traZODone  (DESYREL ) 50 MG tablet [Pharmacy Med Name: TRAZODONE  HCL 50 MG TAB] 30 tablet 0    Sig: TAKE 1 TABLET BY MOUTH AT BEDTIME *NEED APPOINTMENT FOR FURTHER FILLS     Psychiatry: Antidepressants - Serotonin Modulator Failed - 01/29/2024 11:56 AM      Failed - Valid encounter within last 6 months    Recent Outpatient Visits   None     Future Appointments             In 4 months MacDiarmid, Glendia, MD [facility]

## 2024-01-29 NOTE — Telephone Encounter (Signed)
 Copied from CRM [id number]. Topic: Clinical - Medication Refill >> Jan 29, 2024 12:22 PM Delon T wrote: Medication: furosemide  (LASIX ) 20 MG tablet- 4 pills left traZODone  (DESYREL ) 50 MG tablet  Has the patient contacted their pharmacy? Yes (Agent: If no, request that the patient contact the pharmacy for the refill. If patient does not wish to contact the pharmacy document the reason why and proceed with request.) (Agent: If yes, when and what did the pharmacy advise?)  This is the patient's preferred pharmacy:  [facility] [place],  - [address] [place] [address] Phone: [phone] Fax: [phone]  Is this the correct pharmacy for this prescription? Yes If no, delete pharmacy and type the correct one.   Has the prescription been filled recently? Yes  Is the patient out of the medication? No  Has the patient been seen for an appointment in the last year OR does the patient have an upcoming appointment? Yes  Can we respond through MyChart? No  Agent: Please be advised that Rx refills may take up to 3 business days. We ask that you follow-up with your pharmacy.

## 2024-02-10 ENCOUNTER — Other Ambulatory Visit: Payer: Self-pay | Admitting: Pain Medicine

## 2024-02-10 ENCOUNTER — Telehealth: Payer: Self-pay

## 2024-02-10 DIAGNOSIS — M48062 Spinal stenosis, lumbar region with neurogenic claudication: Secondary | ICD-10-CM

## 2024-02-10 DIAGNOSIS — M4317 Spondylolisthesis, lumbosacral region: Secondary | ICD-10-CM

## 2024-02-10 DIAGNOSIS — M51372 Other intervertebral disc degeneration, lumbosacral region with discogenic back pain and lower extremity pain: Secondary | ICD-10-CM

## 2024-02-10 DIAGNOSIS — S22080S Wedge compression fracture of T11-T12 vertebra, sequela: Secondary | ICD-10-CM

## 2024-02-10 DIAGNOSIS — G8929 Other chronic pain: Secondary | ICD-10-CM

## 2024-02-10 DIAGNOSIS — M48061 Spinal stenosis, lumbar region without neurogenic claudication: Secondary | ICD-10-CM

## 2024-02-11 ENCOUNTER — Other Ambulatory Visit: Payer: Self-pay | Admitting: Family Medicine

## 2024-02-11 DIAGNOSIS — K219 Gastro-esophageal reflux disease without esophagitis: Secondary | ICD-10-CM

## 2024-02-11 DIAGNOSIS — N183 Chronic kidney disease, stage 3 unspecified: Secondary | ICD-10-CM

## 2024-02-12 ENCOUNTER — Ambulatory Visit
Admission: RE | Admit: 2024-02-12 | Discharge: 2024-02-12 | Disposition: A | Discharge status: Home / Self Care | Attending: Pain Medicine | Admitting: Pain Medicine

## 2024-02-12 ENCOUNTER — Other Ambulatory Visit: Payer: Self-pay | Admitting: Student in an Organized Health Care Education/Training Program

## 2024-02-12 ENCOUNTER — Ambulatory Visit
Admission: RE | Admit: 2024-02-12 | Discharge: 2024-02-12 | Disposition: A | Discharge status: Home / Self Care | Source: Ambulatory Visit | Attending: Pain Medicine | Admitting: Pain Medicine

## 2024-02-12 ENCOUNTER — Telehealth: Payer: Self-pay | Admitting: Student in an Organized Health Care Education/Training Program

## 2024-02-12 ENCOUNTER — Other Ambulatory Visit (HOSPITAL_BASED_OUTPATIENT_CLINIC_OR_DEPARTMENT_OTHER): Payer: Self-pay | Admitting: Pain Medicine

## 2024-02-12 ENCOUNTER — Other Ambulatory Visit: Payer: Self-pay | Admitting: Pain Medicine

## 2024-02-12 DIAGNOSIS — S22080S Wedge compression fracture of T11-T12 vertebra, sequela: Secondary | ICD-10-CM

## 2024-02-12 DIAGNOSIS — G8929 Other chronic pain: Secondary | ICD-10-CM | POA: Insufficient documentation

## 2024-02-12 DIAGNOSIS — M4316 Spondylolisthesis, lumbar region: Secondary | ICD-10-CM | POA: Diagnosis not present

## 2024-02-12 DIAGNOSIS — M545 Low back pain, unspecified: Secondary | ICD-10-CM | POA: Insufficient documentation

## 2024-02-12 DIAGNOSIS — M8000XA Age-related osteoporosis with current pathological fracture, unspecified site, initial encounter for fracture: Secondary | ICD-10-CM

## 2024-02-12 DIAGNOSIS — M47816 Spondylosis without myelopathy or radiculopathy, lumbar region: Secondary | ICD-10-CM | POA: Diagnosis not present

## 2024-02-12 DIAGNOSIS — M47814 Spondylosis without myelopathy or radiculopathy, thoracic region: Secondary | ICD-10-CM | POA: Diagnosis not present

## 2024-02-12 DIAGNOSIS — M47815 Spondylosis without myelopathy or radiculopathy, thoracolumbar region: Secondary | ICD-10-CM | POA: Diagnosis not present

## 2024-02-12 NOTE — Telephone Encounter (Signed)
 Requested Prescriptions  Pending Prescriptions Disp Refills   amLODipine  (NORVASC ) 5 MG tablet [Pharmacy Med Name: AMLODIPINE  BESYLATE 5 MG TAB] 30 tablet 0    Sig: TAKE 1 TABLET BY MOUTH ONCE DAILY WITH LUNCH     Cardiovascular: Calcium Channel Blockers 2 Failed - 02/12/2024  5:25 PM      Failed - Last BP in normal range    BP Readings from Last 1 Encounters:  01/06/24 (!) 164/82         Failed - Valid encounter within last 6 months    Recent Outpatient Visits   None     Future Appointments             In 3 months MacDiarmid, Glendia, MD [facility]rology [place]            Passed - Last Heart Rate in normal range    Pulse Readings from Last 1 Encounters:  01/06/24 (!) 55          lisinopril  (ZESTRIL ) 40 MG tablet [Pharmacy Med Name: LISINOPRIL  40 MG TAB] 30 tablet 0    Sig: TAKE 1 TABLET BY MOUTH ONCE DAILY     Cardiovascular:  ACE Inhibitors Failed - 02/12/2024  5:25 PM      Failed - Cr in normal range and within 180 days    Creat  Date Value Ref Range Status  04/21/2023 0.79 0.60 - 0.95 mg/dL Final         Failed - K in normal range and within 180 days    Potassium  Date Value Ref Range Status  04/21/2023 4.2 3.5 - 5.3 mmol/L Final  11/24/2014 4.2 mmol/L Final    Comment:    3.5-5.1 NOTE: New Reference Range  10/04/14          Failed - Last BP in normal range    BP Readings from Last 1 Encounters:  01/06/24 (!) 164/82         Failed - Valid encounter within last 6 months    Recent Outpatient Visits   None     Future Appointments             In 3 months MacDiarmid, Glendia, MD [facility] Urology [place]            Passed - Patient is not pregnant       omeprazole  (PRILOSEC) 20 MG capsule [Pharmacy Med Name: OMEPRAZOLE  DR 20 MG CAP] 90 capsule 0    Sig: TAKE 1 CAPSULE BY MOUTH ONCE DAILY BEFORE BREAKFAST     Gastroenterology: Proton Pump Inhibitors Failed - 02/12/2024  5:25 PM      Failed - Valid encounter within last 12 months     Recent Outpatient Visits   None     Future Appointments             In 3 months MacDiarmid, Glendia, MD [facility]rology [facility]

## 2024-02-12 NOTE — Progress Notes (Signed)
 Patient called indicating that she was having acute pain in the lower back after attempting to move a bed.  She believes she might have fractured another vertebral body.  We will be ordering x-rays of the thoracic and lumbar spine to evaluate since the patient does have severe osteoporosis with history of prior vertebral body fractures.

## 2024-02-12 NOTE — Telephone Encounter (Signed)
 Huh?

## 2024-02-12 NOTE — Telephone Encounter (Signed)
 Patient moved a bed and is scheduled for injections next week. They are wondering if she needs an xray before the procedure and if Dr Tanya will order this. Please notify.

## 2024-02-13 ENCOUNTER — Telehealth: Payer: Self-pay | Admitting: Pain Medicine

## 2024-02-13 ENCOUNTER — Telehealth: Payer: Self-pay

## 2024-02-13 NOTE — Telephone Encounter (Signed)
 Looking for xray results from yesterday. Please call Niels at [phone]

## 2024-02-13 NOTE — Telephone Encounter (Signed)
 Spoke with Brittney Simpson and let her know the plan of getting results to her this afternoon when films are read.  Call to reading room and pushed to Stat.  Brittney Simpson [place] u[facility] information and will wait to hear from [facility] Registered nurse

## 2024-02-17 ENCOUNTER — Ambulatory Visit
Admission: RE | Admit: 2024-02-17 | Discharge: 2024-02-17 | Disposition: A | Discharge status: Home / Self Care | Source: Ambulatory Visit | Attending: Pain Medicine | Admitting: Pain Medicine

## 2024-02-17 ENCOUNTER — Encounter: Payer: Self-pay | Admitting: Pain Medicine

## 2024-02-17 ENCOUNTER — Ambulatory Visit (HOSPITAL_BASED_OUTPATIENT_CLINIC_OR_DEPARTMENT_OTHER): Admitting: Pain Medicine

## 2024-02-17 VITALS — BP 156/90 | HR 76 | Temp 97.2°F | Resp 11 | Ht 63.0 in | Wt 120.0 lb

## 2024-02-17 DIAGNOSIS — R937 Abnormal findings on diagnostic imaging of other parts of musculoskeletal system: Secondary | ICD-10-CM | POA: Diagnosis not present

## 2024-02-17 DIAGNOSIS — M48061 Spinal stenosis, lumbar region without neurogenic claudication: Secondary | ICD-10-CM | POA: Diagnosis not present

## 2024-02-17 DIAGNOSIS — S22080S Wedge compression fracture of T11-T12 vertebra, sequela: Secondary | ICD-10-CM

## 2024-02-17 DIAGNOSIS — G8929 Other chronic pain: Secondary | ICD-10-CM | POA: Diagnosis not present

## 2024-02-17 DIAGNOSIS — M4317 Spondylolisthesis, lumbosacral region: Secondary | ICD-10-CM

## 2024-02-17 DIAGNOSIS — M48062 Spinal stenosis, lumbar region with neurogenic claudication: Secondary | ICD-10-CM

## 2024-02-17 DIAGNOSIS — M4854XS Collapsed vertebra, not elsewhere classified, thoracic region, sequela of fracture: Secondary | ICD-10-CM | POA: Insufficient documentation

## 2024-02-17 DIAGNOSIS — M79604 Pain in right leg: Secondary | ICD-10-CM | POA: Diagnosis not present

## 2024-02-17 DIAGNOSIS — M5416 Radiculopathy, lumbar region: Secondary | ICD-10-CM | POA: Insufficient documentation

## 2024-02-17 DIAGNOSIS — M51372 Other intervertebral disc degeneration, lumbosacral region with discogenic back pain and lower extremity pain: Secondary | ICD-10-CM | POA: Diagnosis not present

## 2024-02-17 DIAGNOSIS — M5441 Lumbago with sciatica, right side: Secondary | ICD-10-CM | POA: Diagnosis not present

## 2024-02-17 MED ORDER — PENTAFLUOROPROP-TETRAFLUOROETH EX AERO: INHALATION_SPRAY | Freq: Once | CUTANEOUS | Status: DC

## 2024-02-17 MED ORDER — TRIAMCINOLONE ACETONIDE 40 MG/ML IJ SUSP
40.0000 mg | Freq: Once | INTRAMUSCULAR | Status: AC
  Administered 2024-02-17: 40 mg

## 2024-02-17 MED ORDER — TRIAMCINOLONE ACETONIDE 40 MG/ML IJ SUSP
INTRAMUSCULAR | Status: AC
  Filled 2024-02-17: qty 1

## 2024-02-17 MED ORDER — ROPIVACAINE HCL 2 MG/ML IJ SOLN
2.0000 mL | Freq: Once | INTRAMUSCULAR | Status: AC
  Administered 2024-02-17: 2 mL via EPIDURAL
  Filled 2024-02-17: qty 20

## 2024-02-17 MED ORDER — IOHEXOL 180 MG/ML  SOLN
10.0000 mL | Freq: Once | INTRAMUSCULAR | Status: AC
  Administered 2024-02-17: 10 mL via EPIDURAL
  Filled 2024-02-17: qty 20

## 2024-02-17 MED ORDER — SODIUM CHLORIDE (PF) 0.9 % IJ SOLN
INTRAMUSCULAR | Status: AC
  Filled 2024-02-17: qty 10

## 2024-02-17 MED ORDER — LIDOCAINE HCL 2 % IJ SOLN
20.0000 mL | Freq: Once | INTRAMUSCULAR | Status: AC
  Administered 2024-02-17: 400 mg
  Filled 2024-02-17: qty 40

## 2024-02-17 MED ORDER — SODIUM CHLORIDE 0.9% FLUSH
2.0000 mL | Freq: Once | INTRAVENOUS | Status: AC
  Administered 2024-02-17: 2 mL

## 2024-02-17 NOTE — Progress Notes (Signed)
 PROVIDER NOTE: Interpretation of information contained herein should be left to medically-trained personnel. Specific patient instructions are provided elsewhere under Patient Instructions section of medical record. This document was created in part using STT-dictation technology, any transcriptional errors that may result from this process are unintentional.  Patient: Brittney Simpson Type: Established DOB: [birth year] MRN: [id number] PCP: Edman Marsa PARAS, DO  Service: Procedure DOS: 02/17/2024 Setting: Ambulatory Location: Ambulatory outpatient facility Delivery: Face-to-face Provider: Eric DELENA Como, MD Specialty: Interventional Pain Management Specialty designation: 09 Location: Outpatient facility Ref. Prov.: Edman Marsa *       Interventional Therapy   Type: Lumbar epidural steroid injection (LESI) (interlaminar)          Laterality: Right   Level:  L1-2 Level.  Imaging: Fluoroscopic guidance Spinal ([id number]) Anesthesia: Local anesthesia (1-2% Lidocaine ) Anxiolysis: None                 Sedation: No Sedation                       DOS: 02/17/2024  Performed by: Eric DELENA Como, MD  Purpose: Diagnostic/Therapeutic Indications: Lumbar radicular pain of intraspinal etiology of more than 4 weeks that has failed to respond to conservative therapy and is severe enough to impact quality of life or function. 1. Chronic low back pain (Right) w/ radicular pain (Right)   2. Chronic lower extremity pain (3ry area of Pain) (Right)   3. Chronic lumbar radicular pain (Right)   4. Degeneration of intervertebral disc of lumbosacral region with discogenic back pain and lower extremity pain   5. Lumbar foraminal stenosis   6. Lumbar lateral recess stenosis   7. Spinal stenosis of lumbar region with neurogenic claudication   8. Non-traumatic compression fracture of T7 thoracic vertebra, sequela   9. Spondylolisthesis of lumbosacral region (L2-3 and L5-S1)   10.  T12 compression fracture, sequela   11. Abnormal MRI, lumbar spine (02/02/2020)    NAS-11 Pain score:   Pre-procedure: 10-Worst pain ever/10   Post-procedure: 4 /10      Position / Prep / Materials:  Position: Prone w/ head of the table raised (slight reverse trendelenburg) to facilitate breathing.  Prep solution: ChloraPrep (2% chlorhexidine  gluconate and 70% isopropyl alcohol) Prep Area: Entire Posterior Lumbar Region from lower scapular tip down to mid buttocks area and from flank to flank. Materials:  Tray: Epidural tray Needle(s):  Type: Epidural needle (Tuohy) Gauge (G):  17 Length: Regular (3.5-in) Qty: 1  H&P (Pre-op Assessment):  Brittney Simpson is a 89 y.o. (year old), female patient, seen today for interventional treatment. She  has a past surgical history that includes Hernia repair; Hernia repair; Hemorrhoid surgery; Kyphoplasty (N/A, 01/23/2017); Kyphoplasty (N/A, 01/25/2020); and Cholecystectomy. Brittney Simpson has a current medication list which includes the following prescription(s): acetaminophen , amlodipine , brimonidine, vitamin d3, clonazepam , cranberry, furosemide , latanoprost, levothyroxine , lisinopril , melatonin, metoprolol  succinate, omeprazole , tamsulosin , tramadol , and trazodone , and the following Facility-Administered Medications: pentafluoroprop-tetrafluoroeth. Her primarily concern today is the Back Pain  Initial Vital Signs:  Pulse/HCG Rate: 76ECG Heart Rate: 69 Temp: (!) 97.2 F (36.2 C) Resp: 16 BP: (!) 147/81 SpO2: 91 %  BMI: Estimated body mass index is 21.26 kg/m as calculated from the following:   Height as of this encounter: 5' 3 (1.6 m).   Weight as of this encounter: 120 lb (54.4 kg).  Risk Assessment: Allergies: Reviewed. She has no known allergies.  Allergy Precautions: None required Coagulopathies: Reviewed. None identified.  Blood-thinner therapy: None  at this time Active Infection(s): Reviewed. None identified. Brittney Simpson is  afebrile  Site Confirmation: Brittney Simpson was asked to confirm the procedure and laterality before marking the site Procedure checklist: Completed Consent: Before the procedure and under the influence of no sedative(s), amnesic(s), or anxiolytics, the patient was informed of the treatment options, risks and possible complications. To fulfill our ethical and legal obligations, as recommended by the American Medical Association's Code of Ethics, I have informed the patient of my clinical impression; the nature and purpose of the treatment or procedure; the risks, benefits, and possible complications of the intervention; the alternatives, including doing nothing; the risk(s) and benefit(s) of the alternative treatment(s) or procedure(s); and the risk(s) and benefit(s) of doing nothing. The patient was provided information about the general risks and possible complications associated with the procedure. These may include, but are not limited to: failure to achieve desired goals, infection, bleeding, organ or nerve damage, allergic reactions, paralysis, and death. In addition, the patient was informed of those risks and complications associated to Spine-related procedures, such as failure to decrease pain; infection (i.e.: Meningitis, epidural or intraspinal abscess); bleeding (i.e.: epidural hematoma, subarachnoid hemorrhage, or any other type of intraspinal or peri-dural bleeding); organ or nerve damage (i.e.: Any type of peripheral nerve, nerve root, or spinal cord injury) with subsequent damage to sensory, motor, and/or autonomic systems, resulting in permanent pain, numbness, and/or weakness of one or several areas of the body; allergic reactions; (i.e.: anaphylactic reaction); and/or death. Furthermore, the patient was informed of those risks and complications associated with the medications. These include, but are not limited to: allergic reactions (i.e.: anaphylactic or anaphylactoid reaction(s)); adrenal  axis suppression; blood sugar elevation that in diabetics may result in ketoacidosis or comma; water retention that in patients with history of congestive heart failure may result in shortness of breath, pulmonary edema, and decompensation with resultant heart failure; weight gain; swelling or edema; medication-induced neural toxicity; particulate matter embolism and blood vessel occlusion with resultant organ, and/or nervous system infarction; and/or aseptic necrosis of one or more joints. Finally, the patient was informed that Medicine is not an exact science; therefore, there is also the possibility of unforeseen or unpredictable risks and/or possible complications that may result in a catastrophic outcome. The patient indicated having understood very clearly. We have given the patient no guarantees and we have made no promises. Enough time was given to the patient to ask questions, all of which were answered to the patient's satisfaction. Ms. Finks has indicated that she wanted to continue with the procedure. Attestation: I, the ordering provider, attest that I have discussed with the patient the benefits, risks, side-effects, alternatives, likelihood of achieving goals, and potential problems during recovery for the procedure that I have provided informed consent. Date  Time: 02/17/2024 12:59 PM  Pre-Procedure Preparation:  Monitoring: As per clinic protocol. Respiration, ETCO2, SpO2, BP, heart rate and rhythm monitor placed and checked for adequate function Safety Precautions: Patient was assessed for positional comfort and pressure points before starting the procedure. Time-out: I initiated and conducted the Time-out before starting the procedure, as per protocol. The patient was asked to participate by confirming the accuracy of the Time Out information. Verification of the correct person, site, and procedure were performed and confirmed by me, the nursing staff, and the patient. Time-out  conducted as per Joint Commission's Universal Protocol (UP.01.01.01). Time: 1333 Start Time: 1333 hrs.  Description/Narrative of Procedure:          Target: Epidural  space via interlaminar opening, initially targeting the lower laminar border of the superior vertebral body. Region: Lumbar Approach: Percutaneous paravertebral  Rationale (medical necessity): procedure needed and proper for the diagnosis and/or treatment of the patient's medical symptoms and needs. Procedural Technique Safety Precautions: Aspiration looking for blood return was conducted prior to all injections. At no point did we inject any substances, as a needle was being advanced. No attempts were made at seeking any paresthesias. Safe injection practices and needle disposal techniques used. Medications properly checked for expiration dates. SDV (single dose vial) medications used. Description of the Procedure: Protocol guidelines were followed. The procedure needle was introduced through the skin, ipsilateral to the reported pain, and advanced to the target area. Bone was contacted and the needle walked caudad, until the lamina was cleared. The epidural space was identified using "loss-of-resistance technique" with 2-3 ml of PF-NaCl (0.9% NSS), in a 5cc LOR glass syringe.  Vitals:   02/17/24 1328 02/17/24 1330 02/17/24 1335 02/17/24 1341  BP: (!) 167/91 (!) 159/93 (!) 167/90 (!) 156/90  Pulse:      Resp: 12 13 14 11   Temp:      SpO2: 100% 97% 97% 96%  Weight:      Height:        Start Time: 1333 hrs. End Time: 1340 hrs.  Imaging Guidance (Spinal):          Type of Imaging Technique: Fluoroscopy Guidance (Spinal) Indication(s): Fluoroscopy guidance for needle placement to enhance accuracy in procedures requiring precise needle localization for targeted delivery of medication in or near specific anatomical locations not easily accessible without such real-time imaging assistance. Exposure Time: Please see nurses  notes. Contrast: Before injecting any contrast, we confirmed that the patient did not have an allergy to iodine, shellfish, or radiological contrast. Once satisfactory needle placement was completed at the desired level, radiological contrast was injected. Contrast injected under live fluoroscopy. No contrast complications. See chart for type and volume of contrast used. Fluoroscopic Guidance: I was personally present during the use of fluoroscopy. Tunnel Vision Technique used to obtain the best possible view of the target area. Parallax error corrected before commencing the procedure. Direction-depth-direction technique used to introduce the needle under continuous pulsed fluoroscopy. Once target was reached, antero-posterior, oblique, and lateral fluoroscopic projection used confirm needle placement in all planes. Images permanently stored in EMR. Interpretation: I personally interpreted the imaging intraoperatively. Adequate needle placement confirmed in multiple planes. Appropriate spread of contrast into desired area was observed. No evidence of afferent or efferent intravascular uptake. No intrathecal or subarachnoid spread observed. Permanent images saved into the patient's record.  Antibiotic Prophylaxis:   Anti-infectives (From admission, onward)    None      Indication(s): None identified  Post-operative Assessment:  Post-procedure Vital Signs:  Pulse/HCG Rate: 7693 Temp: (!) 97.2 F (36.2 C) Resp: 11 BP: (!) 156/90 SpO2: 96 %  EBL: None  Complications: No immediate post-treatment complications observed by team, or reported by patient.  Note: The patient tolerated the entire procedure well. A repeat set of vitals were taken after the procedure and the patient was kept under observation following institutional policy, for this type of procedure. Post-procedural neurological assessment was performed, showing return to baseline, prior to discharge. The patient was provided with  post-procedure discharge instructions, including a section on how to identify potential problems. Should any problems arise concerning this procedure, the patient was given instructions to immediately contact us , at any time, without hesitation. In any case, we  plan to contact the patient by telephone for a follow-up status report regarding this interventional procedure.  Comments:  No additional relevant information.  Plan of Care (POC)  Orders:  Orders Placed This Encounter  Procedures   Lumbar Epidural Injection    Scheduling Instructions:     Procedure: Interlaminar LESI L1-2     Laterality: Right     Sedation: No Sedation     Date: 02/17/2024    Where will this procedure be performed?:   [facility]   [facility] C-ARM 1-60 MIN NO REPORT    Intraoperative interpretation by procedural physician at [facility].    Standing Status:   Standing    Number of Occurrences:   1    Reason for exam::   Assistance in needle guidance and placement for procedures requiring needle placement in or near specific anatomical locations not easily accessible without such assistance.   Informed Consent Details: Physician/Practitioner Attestation; Transcribe to consent form and obtain patient signature    Note: Always confirm laterality of pain with Ms. Salmela, before procedure. Transcribe to consent form and obtain patient signature.    Physician/Practitioner attestation of informed consent for procedure/surgical case:   I, the physician/practitioner, attest that I have discussed with the patient the benefits, risks, side effects, alternatives, likelihood of achieving goals and potential problems during recovery for the procedure that I have provided informed consent.    Procedure:   Lumbar epidural steroid injection under fluoroscopic guidance    Physician/Practitioner performing the procedure:   Granger Chui A. Elaya Droege, MD    Indication/Reason:   Low back and/or lower extremity pain  secondary to lumbar radiculitis   Provide equipment / supplies at bedside    Procedural tray: Epidural Tray (Disposable  single use) Skin infiltration needle: Regular 1.5-in, 25-G, (x1) Block needle size: Regular standard Catheter: No catheter required    Standing Status:   Standing    Number of Occurrences:   1    Specify:   Epidural Tray     Opioid Analgesic: Tramadol  50 mg, 1 tab PO q 8 hrs (150 mg/day of tramadol ) MME/day: 15 mg/day.    Medications ordered for procedure: Meds ordered this encounter  Medications   iohexol  (OMNIPAQUE ) 180 MG/ML injection 10 mL    Must be Myelogram-compatible. If not available, you may substitute with a water-soluble, non-ionic, hypoallergenic, myelogram-compatible radiological contrast medium.   lidocaine  (XYLOCAINE ) 2 % (with pres) injection 400 mg   pentafluoroprop-tetrafluoroeth (GEBAUERS) aerosol   sodium chloride  flush (NS) 0.9 % injection 2 mL   ropivacaine  (PF) 2 mg/mL (0.2%) (NAROPIN ) injection 2 mL   triamcinolone  acetonide (KENALOG -40) injection 40 mg   Medications administered: We administered iohexol , lidocaine , sodium chloride  flush, ropivacaine  (PF) 2 mg/mL (0.2%), and triamcinolone  acetonide.  See the medical record for exact dosing, route, and time of administration.    Interventional Therapies  Risk  Complexity Considerations:   Advanced age  HOH  AAA  HTN  Stage3 CKD  GERD   Note: Hard of hearing   Planned  Pending:   (PRN) palliative right L1-2 LESI #13 (02/17/2024)    Under consideration:   (PRN) palliative right L1-2 LESI #14    Completed:   Palliative right L1-2 LESI x12 (01/06/2024) (100/100/75/75)  Therapeutic right L2-3 LESI x1 (03/07/2020) (100/100/25/<50)  Diagnostic/therapeutic bilateral SI joint injection x1 (02/10/2020) (100/100/100 x2 days/0)  Palliative right lumbar facet MBB x13 (01/04/2020) (8 to 0) (100/100/100)  Palliative left lumbar facet MBB x11 (01/04/2020) (8  to 0) (100/100/100)     Therapeutic  Palliative (PRN) options:   Palliative right T12-L1 LESI (PRN)   Pharmacotherapy  Nonopioids transferred 04/11/2020: Robaxin        Follow-up plan:   Return in about 2 weeks (around 03/02/2024) for (VV), (PPE).     Recent Visits Date Type Provider Dept  01/21/24 Office Visit Tanya Glisson, MD [facility]  01/06/24 Procedure visit Tanya Glisson, MD [facility]  Showing recent visits within past 90 days and meeting all other requirements Today's Visits Date Type Provider Dept  02/17/24 Procedure visit Tanya Glisson, MD [facility]  Showing today's visits and meeting all other requirements Future Appointments Date Type Provider Dept  03/03/24 Appointment Tanya Glisson, MD [facility]  03/31/24 Appointment Tanya Glisson, MD [facility]  Showing future appointments within next 90 days and meeting all other requirements   Disposition: Discharge home  Discharge (Date  Time): 02/17/2024; 1350 hrs.   Primary Care Physician: Edman Marsa PARAS, DO Location: [facility] Note by: Glisson DELENA Tanya, MD (TTS technology used. I apologize for any typographical errors that were not detected and corrected.) Date: 02/17/2024; Time: 3:20 PM  Disclaimer:  Medicine is not an Visual merchandiser. The only guarantee in medicine is that nothing is guaranteed. It is important to note that the decision to proceed with this intervention was based on the information collected from the patient. The Data and conclusions were drawn from the patient's questionnaire, the interview, and the physical examination. Because the information was provided in large part by the patient, it cannot be guaranteed that it has not been purposely or unconsciously manipulated. Every effort has been made to obtain as much relevant data as possible for this evaluation. It is important to note that the conclusions  that lead to this procedure are derived in large part from the available data. Always take into account that the treatment will also be dependent on availability of resources and existing treatment guidelines, considered by other Pain Management Practitioners as being common knowledge and practice, at the time of the intervention. For Medico-Legal purposes, it is also important to point out that variation in procedural techniques and pharmacological choices are the acceptable norm. The indications, contraindications, technique, and results of the above procedure should only be interpreted and judged by a Board-Certified Interventional Pain Specialist with extensive familiarity and expertise in the same exact procedure and technique.

## 2024-02-17 NOTE — Patient Instructions (Addendum)
 ______________________________________________________________________    Post-Procedure Discharge Instructions  Instructions: Apply ice:  Purpose: This will minimize any swelling and discomfort after procedure.  When: Day of procedure, as soon as you get home. How: Fill a plastic sandwich bag with crushed ice. Cover it with a small towel and apply to injection site. How long: (15 min on, 15 min off) Apply for 15 minutes then remove x 15 minutes.  Repeat sequence on day of procedure, until you go to bed. Apply heat:  Purpose: To treat any soreness and discomfort from the procedure. When: Starting the next day after the procedure. How: Apply heat to procedure site starting the day following the procedure. How long: May continue to repeat daily, until discomfort goes away. Food intake: Start with clear liquids (like water ) and advance to regular food, as tolerated.  Physical activities: Keep activities to a minimum for the first 8 hours after the procedure. After that, then as tolerated. Driving: If you have received any sedation, be responsible and do not drive. You are not allowed to drive for 24 hours after having sedation. Blood thinner: (Applies only to those taking blood thinners) You may restart your blood thinner 6 hours after your procedure. Insulin: (Applies only to Diabetic patients taking insulin) As soon as you can eat, you may resume your normal dosing schedule. Infection prevention: Keep procedure site clean and dry. Shower daily and clean area with soap and water . Post-procedure Pain Diary: Extremely important that this be done correctly and accurately. Recorded information will be used to determine the next step in treatment. For the purpose of accuracy, follow these rules: Evaluate only the area treated. Do not report or include pain from an untreated area. For the purpose of this evaluation, ignore all other areas of pain, except for the treated area. After your procedure,  avoid taking a long nap and attempting to complete the pain diary after you wake up. Instead, set your alarm clock to go off every hour, on the hour, for the initial 8 hours after the procedure. Document the duration of the numbing medicine, and the relief you are getting from it. Do not go to sleep and attempt to complete it later. It will not be accurate. If you received sedation, it is likely that you were given a medication that may cause amnesia. Because of this, completing the diary at a later time may cause the information to be inaccurate. This information is needed to plan your care. Follow-up appointment: Keep your post-procedure follow-up evaluation appointment after the procedure (usually 2 weeks for most procedures, 6 weeks for radiofrequencies). DO NOT FORGET to bring you pain diary with you.   Expect: (What should I expect to see with my procedure?) From numbing medicine (AKA: Local Anesthetics): Numbness or decrease in pain. You may also experience some weakness, which if present, could last for the duration of the local anesthetic. Onset: Full effect within 15 minutes of injected. Duration: It will depend on the type of local anesthetic used. On the average, 1 to 8 hours.  From steroids (Applies only if steroids were used): Decrease in swelling or inflammation. Once inflammation is improved, relief of the pain will follow. Onset of benefits: Depends on the amount of swelling present. The more swelling, the longer it will take for the benefits to be seen. In some cases, up to 10 days. Duration: Steroids will stay in the system x 2 weeks. Duration of benefits will depend on multiple posibilities including persistent irritating factors. Side-effects: If  present, they may typically last 2 weeks (the duration of the steroids). Frequent: Cramps (if they occur, drink Gatorade and take over-the-counter Magnesium 450-500 mg once to twice a day); water  retention with temporary weight gain;  increases in blood sugar; decreased immune system response; increased appetite. Occasional: Facial flushing (red, warm cheeks); mood swings; menstrual changes. Uncommon: Long-term decrease or suppression of natural hormones; bone thinning. (These are more common with higher doses or more frequent use. This is why we prefer that our patients avoid having any injection therapies in other practices.)  Very Rare: Severe mood changes; psychosis; aseptic necrosis. From procedure: Some discomfort is to be expected once the numbing medicine wears off. This should be minimal if ice and heat are applied as instructed.  Call if: (When should I call?) You experience numbness and weakness that gets worse with time, as opposed to wearing off. New onset bowel or bladder incontinence. (Applies only to procedures done in the spine)  Emergency Numbers: Durning business hours (Monday - Thursday, 8:00 AM - 4:00 PM) (Friday, 9:00 AM - 12:00 Noon): [phone] After hours: [phone] NOTE: If you are having a problem and are unable connect with, or to talk to a provider, then go to your nearest urgent care or emergency department. If the problem is serious and urgent, please call 911. ______________________________________________________________________     ____________________________________________________________________________________________  Spondylolisthesis  Spondylolisthesis is a condition that occurs when a vertebra in the spine slips out of place, usually in the lower back. Symptoms can vary from mild to severe, and a person may have no symptoms.  Some common symptoms include:  Back pain, especially chronic pain  Pain that radiates down the legs  Pain that worsens with exercise  Tightness in the hamstrings  Neck stiffness  Loss of spine flexibility  Weakness in the legs or trouble walking  Numbness and tingling in the groin and/or buttocks   Some causes of spondylolisthesis  include: Birth defects. Sudden injury. Abnormal wear on the cartilage and bones, such as arthritis. Bone disease and fractures. Certain sports activities, such as gymnastics, weightlifting, and football.  A doctor can diagnose spondylolisthesis with a physical exam, X-rays, and possibly a CT scan.    Forward slippage is known as "Anterolisthesis".  Backward slippage is known as "Retrolisthesis".   Pathophysiology of Spondylolisthesis:   Grading Classification of Spondylolisthesis Grade I spondylolisthesis is 1 to 25% slippage, grade II is up to 50% slippage, grade III is up to 75% slippage, and grade IV is 76-100% slippage. If there is more than 100% slippage, it is known as spondyloptosis or grade V spondylolisthesis.       ____________________________________________________________________________________________

## 2024-02-17 NOTE — Progress Notes (Signed)
 Safety precautions to be maintained throughout the outpatient stay will include: orient to surroundings, keep bed in low position, maintain call bell within reach at all times, provide assistance with transfer out of bed and ambulation.

## 2024-02-18 ENCOUNTER — Telehealth: Payer: Self-pay

## 2024-02-18 NOTE — Telephone Encounter (Signed)
 Post procedure follow up. Daughter Niels has not spokent o her yet this morning but willcall us  if there is any problems.

## 2024-02-25 ENCOUNTER — Other Ambulatory Visit: Payer: Self-pay | Admitting: Family Medicine

## 2024-02-25 DIAGNOSIS — F5101 Primary insomnia: Secondary | ICD-10-CM

## 2024-02-26 ENCOUNTER — Other Ambulatory Visit: Payer: Self-pay | Admitting: Family Medicine

## 2024-02-26 DIAGNOSIS — F5101 Primary insomnia: Secondary | ICD-10-CM

## 2024-02-26 NOTE — Telephone Encounter (Signed)
 Requested medication (s) are due for refill today- yes  Requested medication (s) are on the active medication list -yes  Future visit scheduled -yes  Last refill: 01/29/24 #30  Notes to clinic: last RF has notes- courtesy refill, appointment scheduled 03/03/24- sent for review of request  Requested Prescriptions  Pending Prescriptions Disp Refills   traZODone  (DESYREL ) 50 MG tablet [Pharmacy Med Name: TRAZODONE  HCL 50 MG TAB] 30 tablet 0    Sig: TAKE 1 TABLET BY MOUTH AT BEDTIME *NEED APPOINTMENT FOR FURTHER FILLS     Psychiatry: Antidepressants - Serotonin Modulator Failed - 02/26/2024 12:57 PM      Failed - Valid encounter within last 6 months    Recent Outpatient Visits   None     Future Appointments             In 3 months MacDiarmid, Glendia, MD [facility] [place]               Requested Prescriptions  Pending Prescriptions Disp Refills   traZODone  (DESYREL ) 50 MG tablet [Pharmacy Med Name: TRAZODONE  HCL 50 MG TAB] 30 tablet 0    Sig: TAKE 1 TABLET BY MOUTH AT BEDTIME *NEED APPOINTMENT FOR FURTHER FILLS     Psychiatry: Antidepressants - Serotonin Modulator Failed - 02/26/2024 12:57 PM      Failed - Valid encounter within last 6 months    Recent Outpatient Visits   None     Future Appointments             In 3 months MacDiarmid, Glendia, MD [facility]

## 2024-02-26 NOTE — Telephone Encounter (Unsigned)
 Copied from CRM [id number]. Topic: Clinical - Medication Refill >> Feb 26, 2024  2:31 PM Charlet HERO wrote: Medication: traZODone  (DESYREL ) 50 MG tablet  Has the patient contacted their pharmacy? Yes Pharmacy on the line  This is the patient's preferred pharmacy:  [facility] - [place], [place] - [address] [place] [address] Phone: [phone] Fax: [phone]  Is this the correct pharmacy for this prescription? Yes If no, delete pharmacy and type the correct one.   Has the prescription been filled recently? Yes  Is the patient out of the medication? Yes  Has the patient been seen for an appointment in the last year OR does the patient have an upcoming appointment? Yes  Can we respond through MyChart? No  Agent: Please be advised that Rx refills may take up to 3 business days. We ask that you follow-up with your pharmacy.

## 2024-02-27 NOTE — Telephone Encounter (Signed)
 Requested Prescriptions  Refused Prescriptions Disp Refills   traZODone  (DESYREL ) 50 MG tablet [Pharmacy Med Name: TRAZODONE  HCL 50 MG TAB] 30 tablet 0    Sig: TAKE 1 TABLET BY MOUTH AT BEDTIME AS DIRECTED NEED APPOINTMENT FOR FURTHER REFILLS     Psychiatry: Antidepressants - Serotonin Modulator Failed - 02/27/2024 12:34 PM      Failed - Valid encounter within last 6 months    Recent Outpatient Visits   None     Future Appointments             In 3 months MacDiarmid, Glendia, MD [facility]

## 2024-03-03 ENCOUNTER — Encounter: Payer: Self-pay | Admitting: Pain Medicine

## 2024-03-03 ENCOUNTER — Ambulatory Visit (INDEPENDENT_AMBULATORY_CARE_PROVIDER_SITE_OTHER): Admitting: Family Medicine

## 2024-03-03 ENCOUNTER — Other Ambulatory Visit: Payer: Self-pay | Admitting: Pain Medicine

## 2024-03-03 ENCOUNTER — Telehealth: Payer: Self-pay

## 2024-03-03 ENCOUNTER — Encounter: Payer: Self-pay | Admitting: Family Medicine

## 2024-03-03 ENCOUNTER — Telehealth: Admitting: Pain Medicine

## 2024-03-03 VITALS — BP 110/68 | HR 45 | Ht 63.0 in

## 2024-03-03 DIAGNOSIS — M4310 Spondylolisthesis, site unspecified: Secondary | ICD-10-CM | POA: Insufficient documentation

## 2024-03-03 DIAGNOSIS — S22060S Wedge compression fracture of T7-T8 vertebra, sequela: Secondary | ICD-10-CM | POA: Insufficient documentation

## 2024-03-03 DIAGNOSIS — N183 Chronic kidney disease, stage 3 unspecified: Secondary | ICD-10-CM | POA: Diagnosis not present

## 2024-03-03 DIAGNOSIS — R2689 Other abnormalities of gait and mobility: Secondary | ICD-10-CM

## 2024-03-03 DIAGNOSIS — I129 Hypertensive chronic kidney disease with stage 1 through stage 4 chronic kidney disease, or unspecified chronic kidney disease: Secondary | ICD-10-CM

## 2024-03-03 DIAGNOSIS — S22080S Wedge compression fracture of T11-T12 vertebra, sequela: Secondary | ICD-10-CM

## 2024-03-03 DIAGNOSIS — M1711 Unilateral primary osteoarthritis, right knee: Secondary | ICD-10-CM | POA: Diagnosis not present

## 2024-03-03 DIAGNOSIS — M5135 Other intervertebral disc degeneration, thoracolumbar region: Secondary | ICD-10-CM

## 2024-03-03 DIAGNOSIS — M51379 Other intervertebral disc degeneration, lumbosacral region without mention of lumbar back pain or lower extremity pain: Secondary | ICD-10-CM

## 2024-03-03 DIAGNOSIS — G894 Chronic pain syndrome: Secondary | ICD-10-CM | POA: Diagnosis not present

## 2024-03-03 DIAGNOSIS — S32040S Wedge compression fracture of fourth lumbar vertebra, sequela: Secondary | ICD-10-CM

## 2024-03-03 DIAGNOSIS — M545 Low back pain, unspecified: Secondary | ICD-10-CM

## 2024-03-03 DIAGNOSIS — G8929 Other chronic pain: Secondary | ICD-10-CM | POA: Insufficient documentation

## 2024-03-03 DIAGNOSIS — M5134 Other intervertebral disc degeneration, thoracic region: Secondary | ICD-10-CM

## 2024-03-03 DIAGNOSIS — M4317 Spondylolisthesis, lumbosacral region: Secondary | ICD-10-CM | POA: Insufficient documentation

## 2024-03-03 DIAGNOSIS — Z9889 Other specified postprocedural states: Secondary | ICD-10-CM | POA: Insufficient documentation

## 2024-03-03 MED ORDER — LISINOPRIL 40 MG PO TABS: 40.0000 mg | ORAL_TABLET | Freq: Every day | ORAL | 3 refills | Status: AC

## 2024-03-03 MED ORDER — METOPROLOL SUCCINATE ER 100 MG PO TB24: 100.0000 mg | ORAL_TABLET | Freq: Every day | ORAL | 3 refills | Status: AC

## 2024-03-03 MED ORDER — AMLODIPINE BESYLATE 5 MG PO TABS: 5.0000 mg | ORAL_TABLET | Freq: Every day | ORAL | 3 refills | Status: AC

## 2024-03-03 MED ORDER — OXYCODONE HCL 5 MG PO TABS: 5.0000 mg | ORAL_TABLET | ORAL | 0 refills | Status: DC | PRN

## 2024-03-03 NOTE — Progress Notes (Unsigned)
 I was recently contacted by the patient's PCP Dr. Edman, due to failure of lumbar epidural steroid injection in relieving the patient's pain.  My experience is that typically this will happen whenever she has an acute episode causing some changes in her lumbar spine.  She has severe osteoporosis and a history of multiple lumbar and thoracic compression fractures including L4, T12, and T7, where she has had vertebral augmentations.  Today I will be entering orders for a CT of the lumbar and thoracic spine to again evaluate her thoracic and lumbar spine.  She has a significant grade 2 (12 mm) anterolisthesis of L5 or S1.SABRA

## 2024-03-03 NOTE — Patient Instructions (Addendum)
 Thank you for coming to the office today.  Advil  / Ibuprofen  200mg  OTC take 2 to 3 pills = 400 to 600mg  per dose, WITH MEAL. For max of 3 times per day for max duration 5-7 days.  Stop Tramadol  Start Oxycodone  5mg  up to max 3 times per day (bottle says every 4 hour as needed, but limit to max of 3 times per day)  I will route this to her Pain Management. Either he or I can help manage with this medication if it works better going forward instead of Tramadol , and they can discuss other injections.   Please schedule a Follow-up Appointment to: Return if symptoms worsen or fail to improve.  If you have any other questions or concerns, please feel free to call the office or send a message through MyChart. You may also schedule an earlier appointment if necessary.  Additionally, you may be receiving a survey about your experience at our office within a few days to 1 week by e-mail or mail. We value your feedback.  Marsa Officer, DO [facility], [place]

## 2024-03-03 NOTE — Progress Notes (Signed)
 Subjective:    Patient ID: Brittney Simpson, female    DOB: 1927-12-06, 89 y.o.   MRN: [id number]  Brittney Simpson is a 89 y.o. female presenting on 03/03/2024 for Medical Management of Chronic Issues   HPI  Discussed the use of AI scribe software for clinical note transcription with the patient, who gave verbal consent to proceed.  History of Present Illness   Brittney Simpson is a 89 year old female who presents with persistent right hip pain. She is accompanied by her daughter, who is also her primary caregiver.  Chronic Right hip pain - Persistent right hip pain described as a 'knife-like' sensation - Pain is constant and worsened by movement and breathing - Steroid epidural injection administered two weeks ago provided no relief for current pain, though it previously alleviated back and hip pain  Analgesic use and efficacy - Currently taking tramadol , primarily prescribed for headaches, up to three times daily - Tramadol  has not been effective for hip pain - History of tolerating stronger analgesics such as hydrocodone  and oxycodone  without adverse effects  Recent laboratory evaluation - Recent blood work performed, including sodium and kidney function tests  Medication considerations - History of abdominal aneurysm, requiring caution with anti-inflammatory medications - Previous use of ibuprofen  for gallbladder issues without complications         02/17/2024    1:02 PM 01/06/2024    8:08 AM 09/23/2023   11:38 AM  Depression screen PHQ 2/9  Decreased Interest 0 0 0  Down, Depressed, Hopeless 0 0 0  PHQ - 2 Score 0 0 0       04/29/2023    2:47 PM 06/24/2022    8:51 AM 12/17/2021   10:59 AM 03/07/2021   10:41 AM  GAD 7 : Generalized Anxiety Score  Nervous, Anxious, on Edge 0 0 0 0  Control/stop worrying 0 0 0 0  Worry too much - different things 0 0 0 0  Trouble relaxing 0 0 0 0  Restless 0 0 0 0  Easily annoyed or irritable 0 0 0 0  Afraid - awful might  happen 0 0 0 0  Total GAD 7 Score 0 0 0 0  Anxiety Difficulty  Not difficult at all Not difficult at all Not difficult at all    Social History   Tobacco Use   Smoking status: Never   Smokeless tobacco: Never  Vaping Use   Vaping status: Never Used  Substance Use Topics   Alcohol use: No    Alcohol/week: 0.0 standard drinks of alcohol   Drug use: No    Review of Systems  HENT:  Positive for hearing loss.    Per HPI unless specifically indicated above     Objective:    BP 110/68 (BP Location: Right Arm, Patient Position: Sitting, Cuff Size: Normal)   Pulse (!) 45   Ht 5' 3 (1.6 m)   SpO2 98%   BMI 21.26 kg/m   Wt Readings from Last 3 Encounters:  02/17/24 120 lb (54.4 kg)  01/06/24 120 lb (54.4 kg)  12/19/23 119 lb 12.8 oz (54.3 kg)    Physical Exam Vitals and nursing note reviewed.  Constitutional:      General: She is not in acute distress.    Appearance: Normal appearance. She is well-developed. She is not diaphoretic.     Comments: Elderly 89 yr female, thin frail, in wheelchair, uncomfortable with back hip pain, cooperative  HENT:  Head: Normocephalic and atraumatic.  Eyes:     General:        Right eye: No discharge.        Left eye: No discharge.     Conjunctiva/sclera: Conjunctivae normal.  Cardiovascular:     Rate and Rhythm: Normal rate.  Pulmonary:     Effort: Pulmonary effort is normal.  Skin:    General: Skin is warm and dry.     Findings: No erythema or rash.  Neurological:     Mental Status: She is alert and oriented to person, place, and time.  Psychiatric:        Mood and Affect: Mood normal.        Behavior: Behavior normal.        Thought Content: Thought content normal.     Comments: Well groomed, good eye contact, normal speech and thoughts     Results for orders placed or performed in visit on 06/09/23  Microscopic Examination   Collection Time: 06/09/23  9:49 AM   Urine  Result Value Ref Range   WBC, UA 0-5 0 - 5 /hpf    RBC, Urine 0-2 0 - 2 /hpf   Epithelial Cells (non renal) 0-10 0 - 10 /hpf   Bacteria, UA None seen None seen/Few  Urinalysis, Complete   Collection Time: 06/09/23  9:49 AM  Result Value Ref Range   Specific Gravity, UA 1.015 1.005 - 1.030   pH, UA 7.0 5.0 - 7.5   Color, UA Yellow Yellow   Appearance Ur Clear Clear   Leukocytes,UA Negative Negative   Protein,UA Negative Negative/Trace   Glucose, UA Negative Negative   Ketones, UA Negative Negative   RBC, UA Negative Negative   Bilirubin, UA Negative Negative   Urobilinogen, Ur 0.2 0.2 - 1.0 mg/dL   Nitrite, UA Negative Negative   Microscopic Examination See below:       Assessment & Plan:   Problem List Items Addressed This Visit     Chronic pain syndrome - Primary (Chronic)   Relevant Medications   oxyCODONE  (OXY IR/ROXICODONE ) 5 MG immediate release tablet   Benign hypertension with CKD (chronic kidney disease) stage III (HCC)   Relevant Medications   lisinopril  (ZESTRIL ) 40 MG tablet   amLODipine  (NORVASC ) 5 MG tablet   metoprolol  succinate (TOPROL -XL) 100 MG 24 hr tablet   Other Visit Diagnoses       Osteoarthritis of right knee, unspecified osteoarthritis type       Relevant Medications   oxyCODONE  (OXY IR/ROXICODONE ) 5 MG immediate release tablet        Chronic right hip and back pain Followed by [facility] Dr Tanya Persistent pain not relieved by steroid epidural  recently on 02/17/24, normally it is effective On chronic Tramadol  insufficient taking 50mg  THREE TIMES A DAY. Previous hydrocodone  and oxycodone  well-tolerated. Discussed risks of stronger medications, including overuse and interactions.  - Discontinue tramadol  today, PAUSE current supply and hold future refills - Advised that I will agree to a ONE TIME order to Prescribe oxycodone  5 mg, up to three times daily as needed, max three doses per day. - Recommend ibuprofen  400-600 mg per dose with meals, up to three times daily for 5-7  days. - use NSAID with caution, she can tolerate short course NSAID as discussed - Schedule follow-up with pain management specialist on March 08, 2024.   - Notify Dr. Naveira about medication change and current plan. Today by routing chart on Epic to share this  communication and form a plan.  She has phone call on 8/11 to check with pain management, reconsider injection or other option. And maybe can discuss switch Tramadol  to other medication options     Refill other medications  Route chart to Dr Tanya send chart and notify him about temporary Oxycodone  order.  No orders of the defined types were placed in this encounter.   Meds ordered this encounter  Medications   lisinopril  (ZESTRIL ) 40 MG tablet    Sig: Take 1 tablet (40 mg total) by mouth daily.    Dispense:  90 tablet    Refill:  3    Add future refills   amLODipine  (NORVASC ) 5 MG tablet    Sig: Take 1 tablet (5 mg total) by mouth daily.    Dispense:  90 tablet    Refill:  3    Add future refills   metoprolol  succinate (TOPROL -XL) 100 MG 24 hr tablet    Sig: Take 1 tablet (100 mg total) by mouth daily. with food    Dispense:  90 tablet    Refill:  3    Add future refills   oxyCODONE  (OXY IR/ROXICODONE ) 5 MG immediate release tablet    Sig: Take 1 tablet (5 mg total) by mouth every 4 (four) hours as needed for severe pain (pain score 7-10).    Dispense:  30 tablet    Refill:  0    She will discontinue Tramadol  while taking Oxycodone . I will notify Dr Tanya.    Follow up plan: Return if symptoms worsen or fail to improve.  Marsa Officer, DO [facility] 03/03/2024, 11:05 AM

## 2024-03-03 NOTE — Telephone Encounter (Signed)
 Dr Tanya ordered a ct scan of the lumbar and thoracic

## 2024-03-07 NOTE — Progress Notes (Signed)
 PROVIDER NOTE: Interpretation of information contained herein should be left to medically-trained personnel. Specific patient instructions are provided elsewhere under Patient Instructions section of medical record. This document was created in part using AI and STT-dictation technology, any transcriptional errors that may result from this process are unintentional.  Patient: Brittney Simpson  Service: E/M   PCP: Edman Marsa PARAS, DO  DOB: 05/02/1928  DOS: 03/08/2024  Provider: Eric DELENA Como, MD  MRN: [id number]  Delivery: Virtual Visit  Specialty: Interventional Pain Management  Type: Established Patient  Setting: Ambulatory outpatient facility  Specialty designation: 09  Referring Prov.: Edman Marsa *  Location: Remote location       Virtual Encounter - Pain Management PROVIDER NOTE: Information contained herein reflects review and annotations entered in association with encounter. Interpretation of such information and data should be left to medically-trained personnel. Information provided to patient can be located elsewhere in the medical record under Patient Instructions. Document created using STT-dictation technology, any transcriptional errors that may result from process are unintentional.    Contact & Pharmacy Preferred: [phone] Home: [phone] (home) Mobile: [phone] (mobile) E-mail: [email or web address]  [facility] [place], [place] - [address] [place] [address] Phone: [phone] Fax: [phone]   Pre-screening  Brittney Simpson offered in-person vs virtual encounter. She indicated preferring virtual for this encounter.   Reason COVID-19*  Social distancing based on CDC and AMA recommendations.   I contacted Brittney Simpson on 03/08/2024 via telephone.      I clearly identified myself as Eric DELENA Como, MD. I verified that I was speaking with the correct person using two identifiers (Name: Brittney Simpson, and date of birth: 08-28-1927).  Consent I sought verbal advanced consent from Brittney Simpson for virtual visit interactions. I informed Brittney Simpson of possible security and privacy concerns, risks, and limitations associated with providing not-in-person medical evaluation and management services. I also informed Brittney Simpson of the availability of in-person appointments. Finally, I informed her that there would be a charge for the virtual visit and that she could be  personally, fully or partially, financially responsible for it. Ms. Ditmer expressed understanding and agreed to proceed.   Historic Elements   Brittney Simpson is a 89 y.o. year old, female patient evaluated today after our last contact on 02/17/2024. Brittney Simpson  has a past medical history of Anxiety, Arthritis, Back ache, Hiatal hernia, Hyperlipidemia, Hypertension, Hypothyroid, Osteopenia, Thyroid  disease, and UTI (urinary tract infection). She also  has a past surgical history that includes Hernia repair; Hernia repair; Hemorrhoid surgery; Kyphoplasty (N/A, 01/23/2017); Kyphoplasty (N/A, 01/25/2020); and Cholecystectomy. Brittney Simpson has a current medication list which includes the following prescription(s): acetaminophen , amlodipine , amoxicillin , brimonidine, vitamin d3, clonazepam , cranberry, furosemide , latanoprost, levothyroxine , lisinopril , melatonin, metoprolol  succinate, omeprazole , oxycodone , tamsulosin , tramadol , and trazodone . She  reports that she has never smoked. She has never used smokeless tobacco. She reports that she does not drink alcohol and does not use drugs. Brittney Simpson has no known allergies.  BMI: Estimated body mass index is 21.26 kg/m as calculated from the following:   Height as of 03/03/24: 5' 3 (1.6 m).   Weight as of 02/17/24: 120 lb (54.4 kg). Last encounter: 01/21/2024. Last procedure: 02/17/2024.  HPI  Today, she is being contacted for a post-procedure assessment.  Although the  patient attained 100% relief of the pain for the duration of the local anesthetic, once the local anesthetic wore off her pain returned and  he seemed to be a lot worse than it usually is.  This patient usually responds extremely well to this palliative epidural steroid injections however, the last time that this happened there was an associated new problem with changes visible on her imaging studies.  She has a longstanding history of prior vertebral body fractures and significant osteoporosis.  Today I had a long conversation with her daughter Niels where I reminded her that she needs to be on medications for that osteoporosis.  She should be not only on vitamin D, magnesium , and calcium, she should also be on more dedicated stronger medications for the treatment of osteoporosis.  We will defer that part of her treatment to her primary care physician Dr. Edman.  Post-Procedure Evaluation   Type: Lumbar epidural steroid injection (LESI) (interlaminar)          Laterality: Right   Level:  L1-2 Level.  Imaging: Fluoroscopic guidance Spinal ([id number]) Anesthesia: Local anesthesia (1-2% Lidocaine ) Anxiolysis: None                 Sedation: No Sedation                       DOS: 02/17/2024  Performed by: Eric DELENA Como, MD  Purpose: Diagnostic/Therapeutic Indications: Lumbar radicular pain of intraspinal etiology of more than 4 weeks that has failed to respond to conservative therapy and is severe enough to impact quality of life or function. 1. Chronic low back pain (Right) w/ radicular pain (Right)   2. Chronic lower extremity pain (3ry area of Pain) (Right)   3. Chronic lumbar radicular pain (Right)   4. Degeneration of intervertebral disc of lumbosacral region with discogenic back pain and lower extremity pain   5. Lumbar foraminal stenosis   6. Lumbar lateral recess stenosis   7. Spinal stenosis of lumbar region with neurogenic claudication   8. Non-traumatic compression fracture of  T7 thoracic vertebra, sequela   9. Spondylolisthesis of lumbosacral region (L2-3 and L5-S1)   10. T12 compression fracture, sequela   11. Abnormal MRI, lumbar spine (02/02/2020)    NAS-11 Pain score:   Pre-procedure: 10-Worst pain ever/10   Post-procedure: 4 /10     Effectiveness:  Initial hour after procedure:   100%. Subsequent 4-6 hours post-procedure:   100%. Analgesia past initial 6 hours:   0%. Ongoing improvement:  Analgesic: The patient had indicated attaining 100% relief of the pain for the duration of the local anesthetic unfortunately, but once the local anesthetic wore off the pain came back with a vengeance.  This is not usually what we see in the last time that something like this happen it was associated to a vertebral body fracture.  For this reason, I went ahead and order a CT scan of the thoracic and lumbar spine looking for possible no fractures.  I decided to go with the CT scan instead of a regular x-ray because the patient already has a history of a 4 mm retropulsion of T12 associated with an acute compression fracture at that level that apparently occurred around 01/10/2017.  By now the patient has had vertebroplasties at the T7, T12, and L4 levels, in addition to a grade 2 anterolisthesis of L5 over S1, secondary to a chronic L5 pars fracture.  In addition she also has a grade 1 retrolisthesis of L2 over L3 and L3 over L4.  Her last lumbar MRI done on 02/02/2020 showed endplate marrow edema at L2-3 as well as L4,  all of which may be also associated with vertebrogenic pain. Function: No benefit ROM: No improvement  Pharmacotherapy Assessment  Opioid Analgesic: Tramadol  50 mg, 1 tab PO q 8 hrs (150 mg/day of tramadol ) MME/day: 15 mg/day.   Monitoring: [place] PMP: PDMP reviewed during this encounter.       Pharmacotherapy: No side-effects or adverse reactions reported. Compliance: No problems identified. Effectiveness: Clinically acceptable. Plan: Refer to POC.  UDS:   Summary  Date Value Ref Range Status  04/07/2023 Note  Final    Comment:    ==================================================================== [employer] Select 13 (MW) ==================================================================== Test                             Result       Flag       Units  Drug Present and Declared for Prescription Verification   7-aminoclonazepam              255          EXPECTED   ng/mg creat    7-aminoclonazepam is an expected metabolite of clonazepam . Source of    clonazepam  is a scheduled prescription medication.    Tramadol                        >25000       EXPECTED   ng/mg creat   O-Desmethyltramadol            >25000       EXPECTED   ng/mg creat   N-Desmethyltramadol            7320         EXPECTED   ng/mg creat    Source of tramadol  is a prescription medication. O-desmethyltramadol    and N-desmethyltramadol are expected metabolites of tramadol .  ==================================================================== Test                      Result    Flag   Units      Ref Range   Creatinine              20               mg/dL      >=79 ==================================================================== Declared Medications:  The flagging and interpretation on this report are based on the  following declared medications.  Unexpected results may arise from  inaccuracies in the declared medications.   **Note: The testing scope of this panel includes these medications:   Clonazepam  (Klonopin )  Tramadol  (Ultram )   **Note: The testing scope of this panel does not include the  following reported medications:   Acetaminophen  (Tylenol )  Amlodipine  (Norvasc )  Cranberry  Eye Drops  Furosemide  (Lasix )  Levothyroxine  (Synthroid )  Lisinopril  (Zestril )  Melatonin  Methocarbamol  (Robaxin )  Metoprolol  (Toprol )  Naloxone  (Narcan )  Omeprazole  (Prilosec)  Tamsulosin  (Flomax )  Trazodone  (Desyrel )  Vitamin  D3 ==================================================================== For clinical consultation, please call [phone]. ====================================================================    No results found for: CBDTHCR, D8THCCBX, D9THCCBX  Laboratory Chemistry Profile   Renal Lab Results  Component Value Date   BUN 9 04/21/2023   CREATININE 0.79 04/21/2023   BCR SEE NOTE: 04/21/2023   GFRAA 76 05/16/2020   GFRNONAA 66 05/16/2020    Hepatic Lab Results  Component Value Date   AST 8 (L) 04/21/2023   ALT 6 04/21/2023   ALBUMIN 3.6 11/22/2016   ALKPHOS 59 11/22/2016   LIPASE 21  06/14/2016    Electrolytes Lab Results  Component Value Date   NA 137 04/21/2023   K 4.2 04/21/2023   CL 99 04/21/2023   CALCIUM 9.3 04/21/2023   MG 2.1 01/14/2014    Bone No results found for: VD25OH, VD125OH2TOT, CI6874NY7, CI7874NY7, 25OHVITD1, 25OHVITD2, 25OHVITD3, TESTOFREE, TESTOSTERONE  Inflammation (CRP: Acute Phase) (ESR: Chronic Phase) No results found for: CRP, ESRSEDRATE, LATICACIDVEN       Note: Above Lab results reviewed.  Imaging  DG PAIN CLINIC C-ARM 1-60 MIN NO REPORT Fluoro was used, but no Radiologist interpretation will be provided.  Please refer to NOTES tab for provider progress note.  Assessment  The primary encounter diagnosis was Acute exacerbation of chronic low back pain. Diagnoses of Chronic low back pain (1ry area of Pain) (Bilateral) (R>L) w/o sciatica, History of vertebral compression fracture (T7, T12, L4), History of kyphoplasty (L4, T12, T7), Grade 1 Retrolisthesis of L2/L3 & L3/L4, L5 Pars defect with spondylolisthesis (Bilateral), Grade 2 (12 mm) Anterolisthesis of lumbosacral spine (L5/S1), Spinal stenosis of lumbar region with neurogenic claudication, Chronic midline thoracic back pain, DDD (degenerative disc disease), thoracolumbar, Degeneration of intervertebral disc of lumbosacral region, unspecified whether pain  present, and Postop check were also pertinent to this visit.  Plan of Care  Problem-specific:  No problem-specific Assessment & Plan notes found for this encounter.  Brittney Simpson has a current medication list which includes the following long-term medication(s): amlodipine , clonazepam , furosemide , levothyroxine , lisinopril , metoprolol  succinate, omeprazole , tramadol , and trazodone .  Pharmacotherapy (Medications Ordered): No orders of the defined types were placed in this encounter.  Orders:  No orders of the defined types were placed in this encounter.  Follow-up plan:   No follow-ups on file.      Interventional Therapies  Risk  Complexity Considerations:   Advanced age  HOH  AAA  HTN  Stage3 CKD  GERD   Note: Hard of hearing   Planned  Pending:   Lumbar and thoracic CT scans ordered to evaluate for possible new vertebral body fractures.   Under consideration:   (PRN) palliative right L1-2 LESI #14    Completed:   Palliative right L1-2 LESI x13 (02/17/2024) (previous: 100/100/75/75)  Therapeutic right L2-3 LESI x1 (03/07/2020) (100/100/25/<50)  Diagnostic/therapeutic bilateral SI joint injection x1 (02/10/2020) (100/100/100 x2 days/0)  Palliative right lumbar facet MBB x13 (01/04/2020) (8 to 0) (100/100/100)  Palliative left lumbar facet MBB x11 (01/04/2020) (8 to 0) (100/100/100)    Therapeutic  Palliative (PRN) options:   Palliative right T12-L1 LESI (PRN)   Pharmacotherapy  Nonopioids transferred 04/11/2020: Robaxin        Recent Visits Date Type Provider Dept  02/17/24 Procedure visit Tanya Glisson, MD [facility]  01/21/24 Office Visit Tanya Glisson, MD [facility]  01/06/24 Procedure visit Tanya Glisson, MD [facility]  Showing recent visits within past 90 days and meeting all other requirements Today's Visits Date Type Provider Dept  03/08/24 Office Visit Tanya Glisson, MD [facility]   Showing today's visits and meeting all other requirements Future Appointments Date Type Provider Dept  03/31/24 Appointment Tanya Glisson, MD [facility]  Showing future appointments within next 90 days and meeting all other requirements  I discussed the assessment and treatment plan with the patient. The patient was provided an opportunity to ask questions and all were answered. The patient agreed with the plan and demonstrated an understanding of the instructions.  Patient advised to call back or seek an in-person evaluation if  the symptoms or condition worsens.  Duration of encounter: 24 minutes.  Note by: Eric DELENA Como, MD Date: 03/08/2024; Time: 3:53 PM

## 2024-03-08 ENCOUNTER — Ambulatory Visit: Attending: Pain Medicine | Admitting: Pain Medicine

## 2024-03-08 DIAGNOSIS — M545 Low back pain, unspecified: Secondary | ICD-10-CM

## 2024-03-08 DIAGNOSIS — M51379 Other intervertebral disc degeneration, lumbosacral region without mention of lumbar back pain or lower extremity pain: Secondary | ICD-10-CM | POA: Diagnosis not present

## 2024-03-08 DIAGNOSIS — Z8781 Personal history of (healed) traumatic fracture: Secondary | ICD-10-CM | POA: Diagnosis not present

## 2024-03-08 DIAGNOSIS — M5135 Other intervertebral disc degeneration, thoracolumbar region: Secondary | ICD-10-CM | POA: Diagnosis not present

## 2024-03-08 DIAGNOSIS — M48062 Spinal stenosis, lumbar region with neurogenic claudication: Secondary | ICD-10-CM | POA: Diagnosis not present

## 2024-03-08 DIAGNOSIS — M4317 Spondylolisthesis, lumbosacral region: Secondary | ICD-10-CM

## 2024-03-08 DIAGNOSIS — Z9889 Other specified postprocedural states: Secondary | ICD-10-CM

## 2024-03-08 DIAGNOSIS — Z09 Encounter for follow-up examination after completed treatment for conditions other than malignant neoplasm: Secondary | ICD-10-CM

## 2024-03-08 DIAGNOSIS — G8929 Other chronic pain: Secondary | ICD-10-CM

## 2024-03-08 DIAGNOSIS — M4310 Spondylolisthesis, site unspecified: Secondary | ICD-10-CM

## 2024-03-09 ENCOUNTER — Ambulatory Visit

## 2024-03-09 ENCOUNTER — Ambulatory Visit
Admission: RE | Admit: 2024-03-09 | Discharge: 2024-03-09 | Disposition: A | Discharge status: Home / Self Care | Source: Ambulatory Visit | Attending: Pain Medicine | Admitting: Pain Medicine

## 2024-03-09 ENCOUNTER — Other Ambulatory Visit: Payer: Self-pay | Admitting: Pain Medicine

## 2024-03-09 ENCOUNTER — Telehealth: Payer: Self-pay

## 2024-03-09 DIAGNOSIS — M545 Low back pain, unspecified: Secondary | ICD-10-CM | POA: Diagnosis not present

## 2024-03-09 DIAGNOSIS — S32040S Wedge compression fracture of fourth lumbar vertebra, sequela: Secondary | ICD-10-CM | POA: Insufficient documentation

## 2024-03-09 DIAGNOSIS — I700 Atherosclerosis of aorta: Secondary | ICD-10-CM | POA: Diagnosis not present

## 2024-03-09 DIAGNOSIS — M47816 Spondylosis without myelopathy or radiculopathy, lumbar region: Secondary | ICD-10-CM | POA: Diagnosis not present

## 2024-03-09 DIAGNOSIS — S22060S Wedge compression fracture of T7-T8 vertebra, sequela: Secondary | ICD-10-CM | POA: Insufficient documentation

## 2024-03-09 DIAGNOSIS — R2689 Other abnormalities of gait and mobility: Secondary | ICD-10-CM | POA: Diagnosis not present

## 2024-03-09 DIAGNOSIS — I714 Abdominal aortic aneurysm, without rupture, unspecified: Secondary | ICD-10-CM | POA: Diagnosis not present

## 2024-03-09 DIAGNOSIS — S22080S Wedge compression fracture of T11-T12 vertebra, sequela: Secondary | ICD-10-CM | POA: Diagnosis not present

## 2024-03-09 DIAGNOSIS — M546 Pain in thoracic spine: Secondary | ICD-10-CM | POA: Insufficient documentation

## 2024-03-09 DIAGNOSIS — K449 Diaphragmatic hernia without obstruction or gangrene: Secondary | ICD-10-CM | POA: Diagnosis not present

## 2024-03-09 DIAGNOSIS — M5134 Other intervertebral disc degeneration, thoracic region: Secondary | ICD-10-CM | POA: Insufficient documentation

## 2024-03-09 DIAGNOSIS — Z9889 Other specified postprocedural states: Secondary | ICD-10-CM | POA: Diagnosis not present

## 2024-03-09 DIAGNOSIS — M4856XA Collapsed vertebra, not elsewhere classified, lumbar region, initial encounter for fracture: Secondary | ICD-10-CM | POA: Diagnosis not present

## 2024-03-09 DIAGNOSIS — M549 Dorsalgia, unspecified: Secondary | ICD-10-CM | POA: Diagnosis not present

## 2024-03-09 DIAGNOSIS — M51379 Other intervertebral disc degeneration, lumbosacral region without mention of lumbar back pain or lower extremity pain: Secondary | ICD-10-CM | POA: Diagnosis not present

## 2024-03-09 DIAGNOSIS — M4317 Spondylolisthesis, lumbosacral region: Secondary | ICD-10-CM | POA: Diagnosis not present

## 2024-03-09 DIAGNOSIS — G8929 Other chronic pain: Secondary | ICD-10-CM | POA: Insufficient documentation

## 2024-03-09 DIAGNOSIS — M5135 Other intervertebral disc degeneration, thoracolumbar region: Secondary | ICD-10-CM | POA: Insufficient documentation

## 2024-03-09 NOTE — Telephone Encounter (Signed)
 Please get the results of Her CT scan and make sure Dr Tanya sees them please. I called to get them read.  Call Niels the daughter if there is anything to be done.  Thank you

## 2024-03-09 NOTE — Progress Notes (Signed)
(  03/09/2024) patient's daughter called and updated on my review of the lumbar and thoracic CT films.  We still have to wait until we get the official report from radiology to see if there is anything that they see that may guide us  as to what we need to do next.  My initial impression is that the fracture that she has at T12 has continued to progress despite the kyphoplasty.

## 2024-03-10 ENCOUNTER — Telehealth: Payer: Self-pay

## 2024-03-10 ENCOUNTER — Other Ambulatory Visit: Payer: Self-pay | Admitting: Family Medicine

## 2024-03-10 ENCOUNTER — Other Ambulatory Visit (HOSPITAL_BASED_OUTPATIENT_CLINIC_OR_DEPARTMENT_OTHER): Admitting: Pain Medicine

## 2024-03-10 ENCOUNTER — Telehealth: Payer: Self-pay | Admitting: Pain Medicine

## 2024-03-10 DIAGNOSIS — E034 Atrophy of thyroid (acquired): Secondary | ICD-10-CM

## 2024-03-10 DIAGNOSIS — M810 Age-related osteoporosis without current pathological fracture: Secondary | ICD-10-CM

## 2024-03-10 MED ORDER — ALENDRONATE SODIUM 70 MG PO TABS: 70.0000 mg | ORAL_TABLET | ORAL | 11 refills | Status: AC

## 2024-03-10 NOTE — Addendum Note (Signed)
 Addended by: EDMAN MARSA PARAS on: 03/10/2024 06:05 PM   Modules accepted: Orders

## 2024-03-10 NOTE — Telephone Encounter (Signed)
 Copied from CRM #[id number]. Topic: Clinical - Medication Question >> Mar 10, 2024  2:01 PM Mia F wrote: Reason for CRM: Had a ct done on back ordered by PM dr. She has Osteoporosis so pain management asked to reach out to pcp to start pt on a medicine that doesn't interfere with the other medications  Was in office a week ago. Was prescribed a different medication (oxyCODONE  (OXY IR/ROXICODONE ) 5 MG immediate release tablet). She had to stop the medicine because it was making her very nauseated so she went back to the traMADol  (ULTRAM ) 50 MG tablet  Please call pt daughter Niels if anything is needed

## 2024-03-10 NOTE — Telephone Encounter (Signed)
 Results of CT scans posted, Dr. Tanya informed.

## 2024-03-10 NOTE — Telephone Encounter (Signed)
 Called patient. Spoke with daughter Niels. Reviewed recent CT imaging. Stable with chronic compression fractures. We discussed OP treatment options. She has taken Fosamax  Alendronate  years ago 2011-2013 approx in the past. She has been off of it, we have not pursued repeat courses at this time. I advised that based on her OP I would ultimately suspect she would improve most with the stronger treatments however that would involve more upkeep with referral to Endocrine and infusion etc and risk of side effects or other complications. I advised that would be a good option to start with oral bisphosphonate again since tolerated years ago, we can trial this for 2 years and monitor progress. Reconsider Endocrine if needed.  She is off Oxycodone  due to nausea side effect. Back on her long term Tramadol  from Pain Management. She has injection ESI repeat next week 8/19 already scheduled  Marsa Officer, DO [facility] 03/10/2024, 6:04 PM

## 2024-03-10 NOTE — Telephone Encounter (Signed)
 PT daughter call stated that she spoke with Naveira and was told to call schedule patient for lumbar epidural injection on 03-16-24. I didn't see a order. I did schedule appt.

## 2024-03-10 NOTE — Progress Notes (Signed)
(  03/10/2024) the official report on the thoracic and lumbar CT scans revealed that the patient has new endplate fractures at T3 and L5.  I contacted the patient's daughter, Niels, and I have shared that information with her.  She indicates the patient to be having pain now in the lower portion of the back suggesting that the painful fracture is that of the L5 endplate.  Should the patient continue to experience significant pain, we could bring her back to do an L5-S1 LESI.  The patient does have PRN orders for her epidural steroid injections.

## 2024-03-10 NOTE — Telephone Encounter (Signed)
 Dr. Tanya reviewed CT results, called patient's daughter.   She may call to schedule an ESI.SABRA There is a prn order for that.

## 2024-03-12 NOTE — Telephone Encounter (Signed)
 Requested Prescriptions  Refused Prescriptions Disp Refills   levothyroxine  (SYNTHROID ) 112 MCG tablet [Pharmacy Med Name: LEVOTHYROXINE  SODIUM 112 MCG TAB] 90 tablet 1    Sig: TAKE 1 TABLET BY MOUTH ONCE DAILY ON AN EMPTY STOMACH. WAIT 30 MINUTES BEFORE TAKING OTHER MEDS.     Endocrinology:  Hypothyroid Agents Passed - 03/12/2024  3:35 PM      Passed - TSH in normal range and within 360 days    TSH  Date Value Ref Range Status  04/21/2023 1.54 0.40 - 4.50 mIU/L Final         Passed - Valid encounter within last 12 months    Recent Outpatient Visits           1 week ago Chronic pain syndrome   [facility] [place], Marsa PARAS, DO       Future Appointments             In 2 months MacDiarmid, [place]ia, MD [facility]

## 2024-03-16 ENCOUNTER — Encounter: Payer: Self-pay | Admitting: Pain Medicine

## 2024-03-16 ENCOUNTER — Ambulatory Visit (HOSPITAL_BASED_OUTPATIENT_CLINIC_OR_DEPARTMENT_OTHER): Admitting: Pain Medicine

## 2024-03-16 ENCOUNTER — Ambulatory Visit
Admission: RE | Admit: 2024-03-16 | Discharge: 2024-03-16 | Disposition: A | Discharge status: Home / Self Care | Source: Ambulatory Visit | Attending: Pain Medicine | Admitting: Pain Medicine

## 2024-03-16 VITALS — BP 108/89 | HR 84 | Temp 97.4°F | Resp 12 | Ht 64.0 in | Wt 120.0 lb

## 2024-03-16 DIAGNOSIS — Z9889 Other specified postprocedural states: Secondary | ICD-10-CM | POA: Diagnosis not present

## 2024-03-16 DIAGNOSIS — M545 Low back pain, unspecified: Secondary | ICD-10-CM | POA: Diagnosis not present

## 2024-03-16 DIAGNOSIS — M48062 Spinal stenosis, lumbar region with neurogenic claudication: Secondary | ICD-10-CM | POA: Insufficient documentation

## 2024-03-16 DIAGNOSIS — M4317 Spondylolisthesis, lumbosacral region: Secondary | ICD-10-CM | POA: Diagnosis not present

## 2024-03-16 DIAGNOSIS — S22080S Wedge compression fracture of T11-T12 vertebra, sequela: Secondary | ICD-10-CM | POA: Insufficient documentation

## 2024-03-16 DIAGNOSIS — S32040S Wedge compression fracture of fourth lumbar vertebra, sequela: Secondary | ICD-10-CM | POA: Insufficient documentation

## 2024-03-16 DIAGNOSIS — R2689 Other abnormalities of gait and mobility: Secondary | ICD-10-CM | POA: Insufficient documentation

## 2024-03-16 DIAGNOSIS — G8929 Other chronic pain: Secondary | ICD-10-CM

## 2024-03-16 DIAGNOSIS — M4310 Spondylolisthesis, site unspecified: Secondary | ICD-10-CM

## 2024-03-16 DIAGNOSIS — M5416 Radiculopathy, lumbar region: Secondary | ICD-10-CM | POA: Insufficient documentation

## 2024-03-16 DIAGNOSIS — Z8781 Personal history of (healed) traumatic fracture: Secondary | ICD-10-CM | POA: Diagnosis not present

## 2024-03-16 DIAGNOSIS — M51379 Other intervertebral disc degeneration, lumbosacral region without mention of lumbar back pain or lower extremity pain: Secondary | ICD-10-CM | POA: Insufficient documentation

## 2024-03-16 MED ORDER — IOHEXOL 180 MG/ML  SOLN
10.0000 mL | Freq: Once | INTRAMUSCULAR | Status: AC
  Administered 2024-03-16: 10 mL via EPIDURAL
  Filled 2024-03-16: qty 20

## 2024-03-16 MED ORDER — PENTAFLUOROPROP-TETRAFLUOROETH EX AERO
INHALATION_SPRAY | Freq: Once | CUTANEOUS | Status: AC
  Administered 2024-03-16: 30 via TOPICAL

## 2024-03-16 MED ORDER — ROPIVACAINE HCL 2 MG/ML IJ SOLN
2.0000 mL | Freq: Once | INTRAMUSCULAR | Status: AC
  Administered 2024-03-16: 20 mL via EPIDURAL
  Filled 2024-03-16: qty 20

## 2024-03-16 MED ORDER — TRIAMCINOLONE ACETONIDE 40 MG/ML IJ SUSP
40.0000 mg | Freq: Once | INTRAMUSCULAR | Status: AC
  Administered 2024-03-16: 40 mg
  Filled 2024-03-16: qty 1

## 2024-03-16 MED ORDER — SODIUM CHLORIDE 0.9% FLUSH
2.0000 mL | Freq: Once | INTRAVENOUS | Status: AC
  Administered 2024-03-16: 10 mL

## 2024-03-16 MED ORDER — LIDOCAINE HCL 2 % IJ SOLN
20.0000 mL | Freq: Once | INTRAMUSCULAR | Status: AC
  Administered 2024-03-16: 400 mg
  Filled 2024-03-16: qty 20

## 2024-03-16 NOTE — Progress Notes (Signed)
 Safety precautions to be maintained throughout the outpatient stay will include: orient to surroundings, keep bed in low position, maintain call bell within reach at all times, provide assistance with transfer out of bed and ambulation.

## 2024-03-16 NOTE — Progress Notes (Signed)
 PROVIDER NOTE: Interpretation of information contained herein should be left to medically-trained personnel. Specific patient instructions are provided elsewhere under Patient Instructions section of medical record. This document was created in part using STT-dictation technology, any transcriptional errors that may result from this process are unintentional.  Patient: Brittney Simpson Type: Established DOB: 25-Nov-1927 MRN: [id number] PCP: Edman Marsa PARAS, DO  Service: Procedure DOS: 03/16/2024 Setting: Ambulatory Location: Ambulatory outpatient facility Delivery: Face-to-face Provider: Eric DELENA Como, MD Specialty: Interventional Pain Management Specialty designation: 09 Location: Outpatient facility Ref. Prov.: Edman Marsa *       Interventional Therapy   Type: Lumbar epidural steroid injection (LESI) (interlaminar) #1    Laterality: Midline   Level:  L5-S1 Level.  Imaging: Fluoroscopic guidance Spinal ([id number]) Anesthesia: Local anesthesia (1-2% Lidocaine ) Anxiolysis: None                 Sedation: No Sedation                       DOS: 03/16/2024  Performed by: Eric DELENA Como, MD  Purpose: Diagnostic/Therapeutic Indications: Lumbar radicular pain of intraspinal etiology of more than 4 weeks that has failed to respond to conservative therapy and is severe enough to impact quality of life or function. 1. Chronic low back pain (1ry area of Pain) (Bilateral) (R>L) w/o sciatica   2. Chronic lumbar radicular pain (Right)   3. Degeneration of intervertebral disc of lumbosacral region, unspecified whether pain present   4. Compression fracture of L4 lumbar vertebra, sequela   5. Grade 2 (12 mm) Anterolisthesis of lumbosacral spine (L5/S1)   6. Grade 1 Retrolisthesis of L2/L3 & L3/L4   7. History of kyphoplasty (L4, T12, T7)   8. History of vertebral compression fracture (T7, T12, L4)   9. L5 Pars defect with spondylolisthesis (Bilateral)   10. Spinal  stenosis of lumbar region with neurogenic claudication   11. T12 compression fracture, sequela   12. T12-L1 endplate Retropulsion    NAS-11 Pain score:   Pre-procedure: 10-Worst pain ever/10   Post-procedure: 0-No pain/10      Position / Prep / Materials:  Position: Prone w/ head of the table raised (slight reverse trendelenburg) to facilitate breathing.  Prep solution: ChloraPrep (2% chlorhexidine  gluconate and 70% isopropyl alcohol) Prep Area: Entire Posterior Lumbar Region from lower scapular tip down to mid buttocks area and from flank to flank. Materials:  Tray: Epidural tray Needle(s):  Type: Epidural needle (Tuohy) Gauge (G):  17 Length: Regular (3.5-in) Qty: 1  H&P (Pre-op Assessment):  [name] is a 89 y.o. (year old), female patient, seen today for interventional treatment. She  has a past surgical history that includes Hernia repair; Hernia repair; Hemorrhoid surgery; Kyphoplasty (N/A, 01/23/2017); Kyphoplasty (N/A, 01/25/2020); and Cholecystectomy. [name] has a current medication list which includes the following prescription(s): acetaminophen , alendronate , amlodipine , amoxicillin , brimonidine, vitamin d3, clonazepam , cranberry, furosemide , latanoprost, levothyroxine , lisinopril , melatonin, metoprolol  succinate, omeprazole , tamsulosin , tramadol , and trazodone . Her primarily concern today is the Back Pain (lower)  Initial Vital Signs:  Pulse/HCG Rate: 84ECG Heart Rate: 78 Temp: (!) 97.4 F (36.3 C) Resp: 16 BP: 100/60 SpO2: 97 %  BMI: Estimated body mass index is 20.6 kg/m as calculated from the following:   Height as of this encounter: 5' 4 (1.626 m).   Weight as of this encounter: 120 lb (54.4 kg).  Risk Assessment: Allergies: Reviewed. She has no known allergies.  Allergy Precautions: None required Coagulopathies: Reviewed. None identified.  Blood-thinner therapy: None at this time Active Infection(s): Reviewed. None identified. [name] is  afebrile  Site Confirmation: [name] was asked to confirm the procedure and laterality before marking the site Procedure checklist: Completed Consent: Before the procedure and under the influence of no sedative(s), amnesic(s), or anxiolytics, the patient was informed of the treatment options, risks and possible complications. To fulfill our ethical and legal obligations, as recommended by the American Medical Association's Code of Ethics, I have informed the patient of my clinical impression; the nature and purpose of the treatment or procedure; the risks, benefits, and possible complications of the intervention; the alternatives, including doing nothing; the risk(s) and benefit(s) of the alternative treatment(s) or procedure(s); and the risk(s) and benefit(s) of doing nothing. The patient was provided information about the general risks and possible complications associated with the procedure. These may include, but are not limited to: failure to achieve desired goals, infection, bleeding, organ or nerve damage, allergic reactions, paralysis, and death. In addition, the patient was informed of those risks and complications associated to Spine-related procedures, such as failure to decrease pain; infection (i.e.: Meningitis, epidural or intraspinal abscess); bleeding (i.e.: epidural hematoma, subarachnoid hemorrhage, or any other type of intraspinal or peri-dural bleeding); organ or nerve damage (i.e.: Any type of peripheral nerve, nerve root, or spinal cord injury) with subsequent damage to sensory, motor, and/or autonomic systems, resulting in permanent pain, numbness, and/or weakness of one or several areas of the body; allergic reactions; (i.e.: anaphylactic reaction); and/or death. Furthermore, the patient was informed of those risks and complications associated with the medications. These include, but are not limited to: allergic reactions (i.e.: anaphylactic or anaphylactoid reaction(s)); adrenal  axis suppression; blood sugar elevation that in diabetics may result in ketoacidosis or comma; water retention that in patients with history of congestive heart failure may result in shortness of breath, pulmonary edema, and decompensation with resultant heart failure; weight gain; swelling or edema; medication-induced neural toxicity; particulate matter embolism and blood vessel occlusion with resultant organ, and/or nervous system infarction; and/or aseptic necrosis of one or more joints. Finally, the patient was informed that Medicine is not an exact science; therefore, there is also the possibility of unforeseen or unpredictable risks and/or possible complications that may result in a catastrophic outcome. The patient indicated having understood very clearly. We have given the patient no guarantees and we have made no promises. Enough time was given to the patient to ask questions, all of which were answered to the patient's satisfaction. Ms. Gause has indicated that she wanted to continue with the procedure. Attestation: I, the ordering provider, attest that I have discussed with the patient the benefits, risks, side-effects, alternatives, likelihood of achieving goals, and potential problems during recovery for the procedure that I have provided informed consent. Date  Time: 03/16/2024  1:30 PM  Pre-Procedure Preparation:  Monitoring: As per clinic protocol. Respiration, ETCO2, SpO2, BP, heart rate and rhythm monitor placed and checked for adequate function Safety Precautions: Patient was assessed for positional comfort and pressure points before starting the procedure. Time-out: I initiated and conducted the Time-out before starting the procedure, as per protocol. The patient was asked to participate by confirming the accuracy of the Time Out information. Verification of the correct person, site, and procedure were performed and confirmed by me, the nursing staff, and the patient. Time-out  conducted as per Joint Commission's Universal Protocol (UP.01.01.01). Time: 1344 Start Time: 1344 hrs.  Description/Narrative of Procedure:  Target: Epidural space via interlaminar opening, initially targeting the lower laminar border of the superior vertebral body. Region: Lumbar Approach: Percutaneous paravertebral  Rationale (medical necessity): procedure needed and proper for the diagnosis and/or treatment of the patient's medical symptoms and needs. Procedural Technique Safety Precautions: Aspiration looking for blood return was conducted prior to all injections. At no point did we inject any substances, as a needle was being advanced. No attempts were made at seeking any paresthesias. Safe injection practices and needle disposal techniques used. Medications properly checked for expiration dates. SDV (single dose vial) medications used. Description of the Procedure: Protocol guidelines were followed. The procedure needle was introduced through the skin, ipsilateral to the reported pain, and advanced to the target area. Bone was contacted and the needle walked caudad, until the lamina was cleared. The epidural space was identified using "loss-of-resistance technique" with 2-3 ml of PF-NaCl (0.9% NSS), in a 5cc LOR glass syringe.  Vitals:   03/16/24 1329 03/16/24 1344 03/16/24 1349 03/16/24 1352  BP: 100/60 119/80 111/75 108/89  Pulse: 84     Resp: 16 11 10 12   Temp: (!) 97.4 F (36.3 C)     SpO2: 97% 100% 97% 97%  Weight: 120 lb (54.4 kg)     Height: 5' 4 (1.626 m)       Start Time: 1344 hrs. End Time: 1351 hrs.  Imaging Guidance (Spinal):          Type of Imaging Technique: Fluoroscopy Guidance (Spinal) Indication(s): Fluoroscopy guidance for needle placement to enhance accuracy in procedures requiring precise needle localization for targeted delivery of medication in or near specific anatomical locations not easily accessible without such real-time imaging  assistance. Exposure Time: Please see nurses notes. Contrast: Before injecting any contrast, we confirmed that the patient did not have an allergy to iodine, shellfish, or radiological contrast. Once satisfactory needle placement was completed at the desired level, radiological contrast was injected. Contrast injected under live fluoroscopy. No contrast complications. See chart for type and volume of contrast used. Fluoroscopic Guidance: I was personally present during the use of fluoroscopy. Tunnel Vision Technique used to obtain the best possible view of the target area. Parallax error corrected before commencing the procedure. Direction-depth-direction technique used to introduce the needle under continuous pulsed fluoroscopy. Once target was reached, antero-posterior, oblique, and lateral fluoroscopic projection used confirm needle placement in all planes. Images permanently stored in EMR. Interpretation: I personally interpreted the imaging intraoperatively. Adequate needle placement confirmed in multiple planes. Appropriate spread of contrast into desired area was observed. No evidence of afferent or efferent intravascular uptake. No intrathecal or subarachnoid spread observed. Permanent images saved into the patient's record.  Antibiotic Prophylaxis:   Anti-infectives (From admission, onward)    None      Indication(s): None identified  Post-operative Assessment:  Post-procedure Vital Signs:  Pulse/HCG Rate: 8477 Temp: (!) 97.4 F (36.3 C) Resp: 12 BP: 108/89 SpO2: 97 %  EBL: None  Complications: No immediate post-treatment complications observed by team, or reported by patient.  Note: The patient tolerated the entire procedure well. A repeat set of vitals were taken after the procedure and the patient was kept under observation following institutional policy, for this type of procedure. Post-procedural neurological assessment was performed, showing return to baseline, prior  to discharge. The patient was provided with post-procedure discharge instructions, including a section on how to identify potential problems. Should any problems arise concerning this procedure, the patient was given instructions to immediately contact us , at any  time, without hesitation. In any case, we plan to contact the patient by telephone for a follow-up status report regarding this interventional procedure.  Comments:  No additional relevant information.  Plan of Care (POC)  Orders:  Orders Placed This Encounter  Procedures   Lumbar Epidural Injection    Scheduling Instructions:     Procedure: Interlaminar LESI L5-S1     Laterality: Midline     Sedation: No Sedation     Date: 03/16/2024    Where will this procedure be performed?:   [facility]   [facility] C-ARM 1-60 MIN NO REPORT    Intraoperative interpretation by procedural physician at [facility].    Standing Status:   Standing    Number of Occurrences:   1    Reason for exam::   Assistance in needle guidance and placement for procedures requiring needle placement in or near specific anatomical locations not easily accessible without such assistance.   Informed Consent Details: Physician/Practitioner Attestation; Transcribe to consent form and obtain patient signature    Note: Always confirm laterality of pain with Ms. Hu, before procedure. Transcribe to consent form and obtain patient signature.    Physician/Practitioner attestation of informed consent for procedure/surgical case:   I, the physician/practitioner, attest that I have discussed with the patient the benefits, risks, side effects, alternatives, likelihood of achieving goals and potential problems during recovery for the procedure that I have provided informed consent.    Procedure:   Lumbar epidural steroid injection under fluoroscopic guidance    Physician/Practitioner performing the procedure:   Waylynn Benefiel A. Marquest Gunkel, MD     Indication/Reason:   Low back and/or lower extremity pain secondary to lumbar radiculitis   Provide equipment / supplies at bedside    Procedural tray: Epidural Tray (Disposable  single use) Skin infiltration needle: Regular 1.5-in, 25-G, (x1) Block needle size: Regular standard Catheter: No catheter required    Standing Status:   Standing    Number of Occurrences:   1    Specify:   Epidural Tray     Opioid Analgesic: Tramadol  50 mg, 1 tab PO q 8 hrs (150 mg/day of tramadol ) MME/day: 15 mg/day.    Medications ordered for procedure: Meds ordered this encounter  Medications   iohexol  (OMNIPAQUE ) 180 MG/ML injection 10 mL    Must be Myelogram-compatible. If not available, you may substitute with a water-soluble, non-ionic, hypoallergenic, myelogram-compatible radiological contrast medium.   lidocaine  (XYLOCAINE ) 2 % (with pres) injection 400 mg   pentafluoroprop-tetrafluoroeth (GEBAUERS) aerosol   sodium chloride  flush (NS) 0.9 % injection 2 mL   ropivacaine  (PF) 2 mg/mL (0.2%) (NAROPIN ) injection 2 mL   triamcinolone  acetonide (KENALOG -40) injection 40 mg   Medications administered: We administered iohexol , lidocaine , pentafluoroprop-tetrafluoroeth, sodium chloride  flush, ropivacaine  (PF) 2 mg/mL (0.2%), and triamcinolone  acetonide.  See the medical record for exact dosing, route, and time of administration.    Interventional Therapies  Risk  Complexity Considerations:   Advanced age  HOH  AAA  HTN  Stage3 CKD  GERD   Note: Hard of hearing   Planned  Pending:   Therapeutic ML-right L5-S1 LESI #1    Under consideration:   (PRN) palliative right L1-2 LESI #14    Completed:   Palliative right L1-2 LESI x13 (02/17/2024) (previous: 100/100/75/75)  Therapeutic right L2-3 LESI x1 (03/07/2020) (100/100/25/<50)  Diagnostic/therapeutic bilateral SI joint injection x1 (02/10/2020) (100/100/100 x2 days/0)  Palliative right lumbar facet MBB x13 (01/04/2020) (8 to 0) (100/100/100)  Palliative left lumbar facet MBB x11 (01/04/2020) (8 to 0) (100/100/100)    Therapeutic  Palliative (PRN) options:   Palliative right T12-L1 LESI (PRN)   Pharmacotherapy  Nonopioids transferred 04/11/2020: Robaxin        Follow-up plan:   Return in about 2 weeks (around 03/30/2024) for Eval-day (M,W), (VV), (PPE).     Recent Visits Date Type Provider Dept  03/08/24 Office Visit Tanya Glisson, MD [facility]  02/17/24 Procedure visit Tanya Glisson, MD [facility]  01/21/24 Office Visit Tanya Glisson, MD [facility]  01/06/24 Procedure visit Tanya Glisson, MD [facility]  Showing recent visits within past 90 days and meeting all other requirements Today's Visits Date Type Provider Dept  03/16/24 Procedure visit Tanya Glisson, MD [facility]  Showing today's visits and meeting all other requirements Future Appointments Date Type Provider Dept  03/31/24 Appointment Tanya Glisson, MD [facility]  Showing future appointments within next 90 days and meeting all other requirements   Disposition: Discharge home  Discharge (Date  Time): 03/16/2024; 1400 hrs.   Primary Care Physician: Edman Marsa PARAS, DO Location: [facility] Note by: Glisson DELENA Tanya, MD (TTS technology used. I apologize for any typographical errors that were not detected and corrected.) Date: 03/16/2024; Time: 2:06 PM  Disclaimer:  Medicine is not an Visual merchandiser. The only guarantee in medicine is that nothing is guaranteed. It is important to note that the decision to proceed with this intervention was based on the information collected from the patient. The Data and conclusions were drawn from the patient's questionnaire, the interview, and the physical examination. Because the information was provided in large part by the patient, it cannot be guaranteed that it has not been purposely or  unconsciously manipulated. Every effort has been made to obtain as much relevant data as possible for this evaluation. It is important to note that the conclusions that lead to this procedure are derived in large part from the available data. Always take into account that the treatment will also be dependent on availability of resources and existing treatment guidelines, considered by other Pain Management Practitioners as being common knowledge and practice, at the time of the intervention. For Medico-Legal purposes, it is also important to point out that variation in procedural techniques and pharmacological choices are the acceptable norm. The indications, contraindications, technique, and results of the above procedure should only be interpreted and judged by a Board-Certified Interventional Pain Specialist with extensive familiarity and expertise in the same exact procedure and technique.

## 2024-03-16 NOTE — Patient Instructions (Signed)

## 2024-03-17 ENCOUNTER — Telehealth: Payer: Self-pay | Admitting: *Deleted

## 2024-03-17 NOTE — Telephone Encounter (Signed)
 Attempted to call daughter Niels for post procedure follow-up. Message left.

## 2024-03-31 ENCOUNTER — Ambulatory Visit: Payer: BLUE CROSS/BLUE SHIELD | Attending: Pain Medicine | Admitting: Pain Medicine

## 2024-03-31 VITALS — BP 162/85 | HR 65 | Temp 97.3°F | Resp 16 | Ht 63.0 in | Wt 114.0 lb

## 2024-03-31 DIAGNOSIS — Z09 Encounter for follow-up examination after completed treatment for conditions other than malignant neoplasm: Secondary | ICD-10-CM | POA: Insufficient documentation

## 2024-03-31 DIAGNOSIS — S22080S Wedge compression fracture of T11-T12 vertebra, sequela: Secondary | ICD-10-CM | POA: Insufficient documentation

## 2024-03-31 DIAGNOSIS — M25551 Pain in right hip: Secondary | ICD-10-CM | POA: Diagnosis not present

## 2024-03-31 DIAGNOSIS — M533 Sacrococcygeal disorders, not elsewhere classified: Secondary | ICD-10-CM | POA: Diagnosis not present

## 2024-03-31 DIAGNOSIS — G8929 Other chronic pain: Secondary | ICD-10-CM | POA: Diagnosis not present

## 2024-03-31 DIAGNOSIS — Z79899 Other long term (current) drug therapy: Secondary | ICD-10-CM | POA: Diagnosis not present

## 2024-03-31 DIAGNOSIS — M79604 Pain in right leg: Secondary | ICD-10-CM | POA: Insufficient documentation

## 2024-03-31 DIAGNOSIS — Z79891 Long term (current) use of opiate analgesic: Secondary | ICD-10-CM | POA: Insufficient documentation

## 2024-03-31 DIAGNOSIS — M47816 Spondylosis without myelopathy or radiculopathy, lumbar region: Secondary | ICD-10-CM | POA: Insufficient documentation

## 2024-03-31 DIAGNOSIS — G894 Chronic pain syndrome: Secondary | ICD-10-CM | POA: Diagnosis not present

## 2024-03-31 DIAGNOSIS — M5135 Other intervertebral disc degeneration, thoracolumbar region: Secondary | ICD-10-CM | POA: Insufficient documentation

## 2024-03-31 DIAGNOSIS — M545 Low back pain, unspecified: Secondary | ICD-10-CM | POA: Diagnosis not present

## 2024-03-31 DIAGNOSIS — M4854XS Collapsed vertebra, not elsewhere classified, thoracic region, sequela of fracture: Secondary | ICD-10-CM | POA: Insufficient documentation

## 2024-03-31 DIAGNOSIS — M5416 Radiculopathy, lumbar region: Secondary | ICD-10-CM | POA: Insufficient documentation

## 2024-03-31 MED ORDER — TRAMADOL HCL 50 MG PO TABS: 50.0000 mg | ORAL_TABLET | Freq: Three times a day (TID) | ORAL | 5 refills | Status: AC

## 2024-03-31 NOTE — Patient Instructions (Addendum)
 Pain Management Discharge Instructions  General Discharge Instructions :  If you need to reach your doctor call: Monday-Friday 8:00 am - 4:00 pm at [phone] or toll free [phone].  After clinic hours [phone] to have operator reach doctor.  Bring all of your medication bottles to all your appointments in the pain clinic.  To cancel or reschedule your appointment with Pain Management please remember to call 24 hours in advance to avoid a fee.  Refer to the educational materials which you have been given on: General Risks, I had my Procedure. Discharge Instructions, Post Sedation.  Post Procedure Instructions:  The drugs you were given will stay in your system until tomorrow, so for the next 24 hours you should not drive, make any legal decisions or drink any alcoholic beverages.  You may eat anything you prefer, but it is better to start with liquids then soups and crackers, and gradually work up to solid foods.  Please notify your doctor immediately if you have any unusual bleeding, trouble breathing or pain that is not related to your normal pain.  Depending on the type of procedure that was done, some parts of your body may feel week and/or numb.  This usually clears up by tonight or the next day.  Walk with the use of an assistive device or accompanied by an adult for the 24 hours.  You may use ice on the affected area for the first 24 hours.  Put ice in a Ziploc bag and cover with a towel and place against area 15 minutes on 15 minutes off.  You may switch to heat after 24 hours. ______________________________________________________________________    Opioid Pain Medication Update  To: All patients taking opioid pain medications. (I.e.: hydrocodone , hydromorphone , oxycodone , oxymorphone, morphine, codeine , methadone, tapentadol, tramadol , buprenorphine, fentanyl , etc.)  Re: Updated review of side effects and adverse reactions of opioid analgesics, as well as new  information about long term effects of this class of medications.  Direct risks of long-term opioid therapy are not limited to opioid addiction and overdose. Potential medical risks include serious fractures, breathing problems during sleep, hyperalgesia, immunosuppression, chronic constipation, bowel obstruction, myocardial infarction, and tooth decay secondary to xerostomia.  Unpredictable adverse effects that can occur even if you take your medication correctly: Cognitive impairment, respiratory depression, and death. Most people think that if they take their medication correctly, and as instructed, that they will be safe. Nothing could be farther from the truth. In reality, a significant amount of recorded deaths associated with the use of opioids has occurred in individuals that had taken the medication for a long time, and were taking their medication correctly. The following are examples of how this can happen: Patient taking his/her medication for a long time, as instructed, without any side effects, is given a certain antibiotic or another unrelated medication, which in turn triggers a Drug-to-drug interaction leading to disorientation, cognitive impairment, impaired reflexes, respiratory depression or an untoward event leading to serious bodily harm or injury, including death.  Patient taking his/her medication for a long time, as instructed, without any side effects, develops an acute impairment of liver and/or kidney function. This will lead to a rapid inability of the body to breakdown and eliminate their pain medication, which will result in effects similar to an overdose, but with the same medicine and dose that they had always taken. This again may lead to disorientation, cognitive impairment, impaired reflexes, respiratory depression or an untoward event leading to serious bodily harm or injury, including  death.  A similar problem will occur with patients as they grow older and their  liver and kidney function begins to decrease as part of the aging process.  Background information: Historically, the original case for using long-term opioid therapy to treat chronic noncancer pain was based on safety assumptions that subsequent experience has called into question. In 1996, the American Pain Society and the American Academy of Pain Medicine issued a consensus statement supporting long-term opioid therapy. This statement acknowledged the dangers of opioid prescribing but concluded that the risk for addiction was low; respiratory depression induced by opioids was short-lived, occurred mainly in opioid-naive patients, and was antagonized by pain; tolerance was not a common problem; and efforts to control diversion should not constrain opioid prescribing. This has now proven to be wrong. Experience regarding the risks for opioid addiction, misuse, and overdose in community practice has failed to support these assumptions.  According to the Centers for Disease Control and Prevention, fatal overdoses involving opioid analgesics have increased sharply over the past decade. Currently, more than 96,700 people die from drug overdoses every year. Opioids are a factor in 7 out of every 10 overdose deaths. Deaths from drug overdose have surpassed motor vehicle accidents as the leading cause of death for individuals between the ages of 29 and 73.  Clinical data suggest that neuroendocrine dysfunction may be very common in both men and women, potentially causing hypogonadism, erectile dysfunction, infertility, decreased libido, osteoporosis, and depression. Recent studies linked higher opioid dose to increased opioid-related mortality. Controlled observational studies reported that long-term opioid therapy may be associated with increased risk for cardiovascular events. Subsequent meta-analysis concluded that the safety of long-term opioid therapy in elderly patients has not been proven.   Side Effects  and adverse reactions: Common side effects: Drowsiness (sedation). Dizziness. Nausea and vomiting. Constipation. Physical dependence -- Dependence often manifests with withdrawal symptoms when opioids are discontinued or decreased. Tolerance -- As you take repeated doses of opioids, you require increased medication to experience the same effect of pain relief. Respiratory depression -- This can occur in healthy people, especially with higher doses. However, people with COPD, asthma or other lung conditions may be even more susceptible to fatal respiratory impairment.  Uncommon side effects: An increased sensitivity to feeling pain and extreme response to pain (hyperalgesia). Chronic use of opioids can lead to this. Delayed gastric emptying (the process by which the contents of your stomach are moved into your small intestine). Muscle rigidity. Immune system and hormonal dysfunction. Quick, involuntary muscle jerks (myoclonus). Arrhythmia. Itchy skin (pruritus). Dry mouth (xerostomia).  Long-term side effects: Chronic constipation. Sleep-disordered breathing (SDB). Increased risk of bone fractures. Hypothalamic-pituitary-adrenal dysregulation. Increased risk of overdose.  RISKS: Respiratory depression and death: Opioids increase the risk of respiratory depression and death.  Drug-to-drug interactions: Opioids are relatively contraindicated in combination with benzodiazepines, sleep inducers, and other central nervous system depressants. Other classes of medications (i.e.: certain antibiotics and even over-the-counter medications) may also trigger or induce respiratory depression in some patients.  Medical conditions: Patients with pre-existing respiratory problems are at higher risk of respiratory failure and/or depression when in combination with opioid analgesics. Opioids are relatively contraindicated in some medical conditions such as central sleep apnea.   Fractures and Falls:   Opioids increase the risk and incidence of falls. This is of particular importance in elderly patients.  Endocrine System:  Long-term administration is associated with endocrine abnormalities (endocrinopathies). (Also known as Opioid-induced Endocrinopathy) Influences on both the hypothalamic-pituitary-adrenal axis?and the hypothalamic-pituitary-gonadal  axis have been demonstrated with consequent hypogonadism and adrenal insufficiency in both sexes. Hypogonadism and decreased levels of dehydroepiandrosterone sulfate have been reported in men and women. Endocrine effects include: Amenorrhoea in women (abnormal absence of menstruation) Reduced libido in both sexes Decreased sexual function Erectile dysfunction in men Hypogonadisms (decreased testicular function with shrinkage of testicles) Infertility Depression and fatigue Loss of muscle mass Anxiety Depression Immune suppression Hyperalgesia Weight gain Anemia Osteoporosis Patients (particularly women of childbearing age) should avoid opioids. There is insufficient evidence to recommend routine monitoring of asymptomatic patients taking opioids in the long-term for hormonal deficiencies.  Immune System: Human studies have demonstrated that opioids have an immunomodulating effect. These effects are mediated via opioid receptors both on immune effector cells and in the central nervous system. Opioids have been demonstrated to have adverse effects on antimicrobial response and anti-tumour surveillance. Buprenorphine has been demonstrated to have no impact on immune function.  Opioid Induced Hyperalgesia: Human studies have demonstrated that prolonged use of opioids can lead to a state of abnormal pain sensitivity, sometimes called opioid induced hyperalgesia (OIH). Opioid induced hyperalgesia is not usually seen in the absence of tolerance to opioid analgesia. Clinically, hyperalgesia may be diagnosed if the patient on long-term  opioid therapy presents with increased pain. This might be qualitatively and anatomically distinct from pain related to disease progression or to breakthrough pain resulting from development of opioid tolerance. Pain associated with hyperalgesia tends to be more diffuse than the pre-existing pain and less defined in quality. Management of opioid induced hyperalgesia requires opioid dose reduction.  Cancer: Chronic opioid therapy has been associated with an increased risk of cancer among noncancer patients with chronic pain. This association was more evident in chronic strong opioid users. Chronic opioid consumption causes significant pathological changes in the small intestine and colon. Epidemiological studies have found that there is a link between opium dependence and initiation of gastrointestinal cancers. Cancer is the second leading cause of death after cardiovascular disease. Chronic use of opioids can cause multiple conditions such as GERD, immunosuppression and renal damage as well as carcinogenic effects, which are associated with the incidence of cancers.   Mortality: Long-term opioid use has been associated with increased mortality among patients with chronic non-cancer pain (CNCP).  Prescription of long-acting opioids for chronic noncancer pain was associated with a significantly increased risk of all-cause mortality, including deaths from causes other than overdose.  Reference: Von Korff M, Kolodny A, Deyo RA, Chou R. Long-term opioid therapy reconsidered. Ann Intern Med. 2011 Sep 6;155(5):325-8. doi: 10.7326/[id number]. PMID: 78106373; PMCID: [id number]. Kit JINNY Laurence CINDERELLA Pearley JINNY, Hayward RA, Dunn KM, [place] KP. Risk of adverse events in patients prescribed long-term opioids: A cohort study in the [employer]. Eur J Pain. 2019 May;23(5):908-922. doi: 10.1002/ejp.1357. Epub 2019 Jan 31. PMID: 69379883. Colameco S, Coren JS, Ciervo CA.  Continuous opioid treatment for chronic noncancer pain: a time for moderation in prescribing. Postgrad Med. 2009 Jul;121(4):61-6. doi: 10.3810/pgm.2009.07.2032. PMID: 80358728. Gigi JONELLE Shlomo MILUS Levern IVER Conny RN, [place], Blazina I, Lonell DASEN, Bougatsos C, Deyo RA. The effectiveness and risks of long-term opioid therapy for chronic pain: a systematic review for a [employer] of [employer] Pathways to [employer]. Ann Intern Med. 2015 Feb 17;162(4):276-86. doi: 10.7326/M14-2559. PMID: 74418742. Rory CHRISTELLA Laurence [facility], [place] DM. NCHS Data Brief No. 22. [place]: Centers for Disease Control and Prevention; 2009. Sep, Increase in Fatal Poisonings Involving Opioid Analgesics in the [place] , 1999-2006. Rhenda GILLES, Choi HR, Oh  TK. Long-term opioid use and mortality in patients with chronic non-cancer pain: Ten-year follow-up study in [place] from 2010 through 2019. EClinicalMedicine. 2022 Jul 18;51:101558. doi: 10.1016/j.eclinm.2022.898441. PMID: 64124182; PMCID: EFR0695089. Huser, W., Schubert, T., Vogelmann, T. et al. All-cause mortality in patients with long-term opioid therapy compared with non-opioid analgesics for chronic non-cancer pain: a database study. BMC Med 18, 162 (2020). http://lester.info/ Rashidian H, Zendehdel K, Kamangar F, Malekzadeh R, Haghdoost AA. An Ecological Study of the Association between Opiate Use and Incidence of Cancers. Addict Health. 2016 Fall;8(4):252-260. PMID: 71180443; PMCID: EFR4445194.  Our Goal: Our goal is to control your pain with means other than the use of opioid pain medications.  Our Recommendation: Talk to your physician about coming off of these medications. We can assist you with the tapering down and stopping these medicines. Based on the new information, even if you cannot completely stop the medication, a decrease in the dose may be associated with a lesser risk. Ask for other means of controlling the pain. Decrease  or eliminate those factors that significantly contribute to your pain such as smoking, obesity, and a diet heavily tilted towards inflammatory nutrients.  Last Updated: 02/03/2023   ______________________________________________________________________       ______________________________________________________________________    National Pain Medication Shortage  The [place] is experiencing worsening drug shortages. These have had a negative widespread effect on patient care and treatment. Not expected to improve any time soon. Predicted to last past 2029.   Drug shortage list (generic names) Oxycodone  IR Oxycodone /APAP Oxymorphone IR Hydromorphone  Hydrocodone /APAP Morphine  Where is the problem?  Manufacturing and supply level.  Will this shortage affect you?  Only if you take any of the above pain medications.  How? You may be unable to fill your prescription.  Your pharmacist may offer a partial fill of your prescription. (Warning: Do not accept partial fills.) Prescriptions partially filled cannot be transferred to another pharmacy. Read our Medication Rules and Regulation. Depending on how much medicine you are dependent on, you may experience withdrawals when unable to get the medication.  Recommendations: Consider ending your dependence on opioid pain medications. Ask your pain specialist to assist you with the process. Consider switching to a medication currently not in shortage, such as Buprenorphine. Talk to your pain specialist about this option. Consider decreasing your pain medication requirements by managing tolerance thru Drug Holidays. This may help minimize withdrawals, should you run out of medicine. Control your pain thru the use of non-pharmacological interventional therapies.   Your prescriber: Prescribers cannot be blamed for shortages. Medication manufacturing and supply issues cannot be fixed by the prescriber.   NOTE: The prescriber is not  responsible for supplying the medication, or solving supply issues. Work with your pharmacist to solve it. The patient is responsible for the decision to take or continue taking the medication and for identifying and securing a legal supply source. By law, supplying the medication is the job and responsibility of the pharmacy. The prescriber is responsible for the evaluation, monitoring, and prescribing of these medications.   Prescribers will NOT: Re-issue prescriptions that have been partially filled. Re-issue prescriptions already sent to a pharmacy.  Re-send prescriptions to a different pharmacy because yours did not have your medication. Ask pharmacist to order more medicine or transfer the prescription to another pharmacy. (Read below.)  New 2023 regulation: March 29, 2022 Revised Regulation Allows DEA-Registered Pharmacies to Transfer Electronic Prescriptions at a Patient's Request DEA Headquarters Division - Public Information Office Patients now have the  ability to request their electronic prescription be transferred to another pharmacy without having to go back to their practitioner to initiate the request. This revised regulation went into effect on Monday, March 25, 2022.     At a patient's request, a DEA-registered retail pharmacy can now transfer an electronic prescription for a controlled substance (schedules II-V) to another DEA-registered retail pharmacy. Prior to this change, patients would have to go through their practitioner to cancel their prescription and have it re-issued to a different pharmacy. The process was taxing and time consuming for both patients and practitioners.    The Drug Enforcement Administration [facility]) published its intent to revise the process for transferring electronic prescriptions on June 16, 2020.  The final rule was published in the federal register on February 21, 2022 and went into effect 30 days later.  Under the final rule, a prescription can  only be transferred once between pharmacies, and only if allowed under existing state or other applicable law. The prescription must remain in its electronic form; may not be altered in any way; and the transfer must be communicated directly between two licensed pharmacists. It's important to note, any authorized refills transfer with the original prescription, which means the entire prescription will be filled at the same pharmacy.  Reference: HugeHand.is [facility] website announcement)  [email or web address] Financial planner of Justice)   [employer] / Vol. 88, No. 143 / Thursday, February 21, 2022 / Rules and Regulations DEPARTMENT OF JUSTICE  Drug Enforcement Administration  21 CFR Part 1306  [Docket No. [id number]]  RIN [id number] Transfer of Electronic Prescriptions for Schedules II-V Controlled Substances Between Pharmacies for Initial Filling  ______________________________________________________________________       ______________________________________________________________________    Transfer of Pain Medication between Pharmacies  Re: 2023 DEA Clarification on existing regulation  Published on DEA Website: March 29, 2022  Title: Revised Regulation Allows DEA-Registered Pharmacies to Electrical engineer Prescriptions at a Patient's Request DEA Headquarters Division - [employer]  Patients now have the ability to request their electronic prescription be transferred to another pharmacy without having to go back to their practitioner to initiate the request. This revised regulation went into effect on Monday, March 25, 2022.     At a patient's request, a DEA-registered retail pharmacy can now transfer an electronic prescription for a controlled substance (schedules II-V) to another DEA-registered retail  pharmacy. Prior to this change, patients would have to go through their practitioner to cancel their prescription and have it re-issued to a different pharmacy. The process was taxing and time consuming for both patients and practitioners.    The Drug Enforcement Administration [facility]) published its intent to revise the process for transferring electronic prescriptions on June 16, 2020.  The final rule was published in the federal register on February 21, 2022 and went into effect 30 days later.  Under the final rule, a prescription can only be transferred once between pharmacies, and only if allowed under existing state or other applicable law. The prescription must remain in its electronic form; may not be altered in any way; and the transfer must be communicated directly between two licensed pharmacists. It's important to note, any authorized refills transfer with the original prescription, which means the entire prescription will be filled at the same pharmacy.    REFERENCES: 1. DEA website announcement HugeHand.[email or web address]  2. Department of Justice website  [email or web address]  3. DEPARTMENT OF JUSTICE Drug Enforcement Administration 21 CFR Part 1306 [Docket No. [id number]] RIN [id number] Transfer of  Electronic Prescriptions for Schedules II-V Controlled Substances Between Pharmacies for Initial Filling  ______________________________________________________________________       ______________________________________________________________________    Medication Rules  Purpose: To inform patients, and their family members, of our medication rules and regulations.  Applies to: All patients receiving prescriptions from our practice (written or electronic).  Pharmacy of record: This is the pharmacy where your electronic prescriptions will be sent.  Make sure we have the correct one.  Electronic prescriptions: In compliance with the [place]  Strengthen Opioid Misuse Prevention (STOP) Act of 2017 (Session Law 2017-74/H243), effective July 29, 2018, all controlled substances must be electronically prescribed. Written prescriptions, faxing, or calling prescriptions to a pharmacy will no longer be done.  Prescription refills: These will be provided only during in-person appointments. No medications will be renewed without a face-to-face evaluation with your provider. Applies to all prescriptions.  NOTE: The following applies primarily to controlled substances (Opioid* Pain Medications).   Type of encounter (visit): For patients receiving controlled substances, face-to-face visits are required. (Not an option and not up to the patient.)  Patient's Responsibilities: Pain Pills: Bring all pain pills to every appointment (except for procedure appointments). Pill counts are required.  Pill Bottles: Bring pills in original pharmacy bottle. Bring bottle, even if empty. Always bring the bottle of the most recent fill.  Medication refills: You are responsible for knowing and keeping track of what medications you are taking and when is it that you will need a refill. The day before your appointment: write a list of all prescriptions that need to be refilled. The day of the appointment: give the list to the admitting nurse. Prescriptions will be written only during appointments. No prescriptions will be written on procedure days. If you forget a medication: it will not be Called in, Faxed, or electronically sent. You will need to get another appointment to get these prescribed. No early refills. Do not call asking to have your prescription filled early. Partial  or short prescriptions: Occasionally your pharmacy may not have enough pills to fill your prescription.  NEVER ACCEPT a partial fill or a prescription that is short of the total  amount of pills that you were prescribed.  With controlled substances the law allows 72 hours for the pharmacy to complete the prescription.  If the prescription is not completed within 72 hours, the pharmacist will require a new prescription to be written. This means that you will be short on your medicine and we WILL NOT send another prescription to complete your original prescription.  Instead, request the pharmacy to send a carrier to a nearby branch to get enough medication to provide you with your full prescription. Prescription Accuracy: You are responsible for carefully inspecting your prescriptions before leaving our office. Have the discharge nurse carefully go over each prescription with you, before taking them home. Make sure that your name is accurately spelled, that your address is correct. Check the name and dose of your medication to make sure it is accurate. Check the number of pills, and the written instructions to make sure they are clear and accurate. Make sure that you are given enough medication to last until your next medication refill appointment. Taking Medication: Take medication as prescribed. When it comes to controlled substances, taking less pills or less frequently than prescribed is permitted and encouraged. Never take more pills than instructed. Never take the medication more frequently than prescribed.  Inform other Doctors: Always inform, all of your healthcare providers, of all the  medications you take. Pain Medication from other Providers: You are not allowed to accept any additional pain medication from any other Doctor or Healthcare provider. There are two exceptions to this rule. (see below) In the event that you require additional pain medication, you are responsible for notifying us , as stated below. Cough Medicine: Often these contain an opioid, such as codeine  or hydrocodone . Never accept or take cough medicine containing these opioids if you are already taking an  opioid* medication. The combination may cause respiratory failure and death. Medication Agreement: You are responsible for carefully reading and following our Medication Agreement. This must be signed before receiving any prescriptions from our practice. Safely store a copy of your signed Agreement. Violations to the Agreement will result in no further prescriptions. (Additional copies of our Medication Agreement are available upon request.) Laws, Rules, & Regulations: All patients are expected to follow all [address] and [employer], Rules, [employer]. Ignorance of the Laws does not constitute a valid excuse.  Illegal drugs and Controlled Substances: The use of illegal substances (including, but not limited to marijuana and its derivatives) and/or the illegal use of any controlled substances is strictly prohibited. Violation of this rule may result in the immediate and permanent discontinuation of any and all prescriptions being written by our practice. The use of any illegal substances is prohibited. Adopted CDC guidelines & recommendations: Target dosing levels will be at or below 60 MME/day. Use of benzodiazepines** is not recommended. Urine Drug testing: Patients taking controlled substances will be required to provide a urine sample upon request. Do not void before coming to your medication management appointments. Hold emptying your bladder until you are admitted. The admitting nurse will inform you if a sample is required. Our practice reserves the right to call you at any time to provide a sample. Once receiving the call, you have 24 hours to comply with request. Not providing a sample upon request may result in termination of medication therapy.  Exceptions: There are only two exceptions to the rule of not receiving pain medications from other Healthcare Providers. Exception #1 (Emergencies): In the event of an emergency (i.e.: accident requiring emergency care), you are allowed to  receive additional pain medication. However, you are responsible for: As soon as you are able, call our office [phone], at any time of the day or night, and leave a message stating your name, the date and nature of the emergency, and the name and dose of the medication prescribed. In the event that your call is answered by a member of our staff, make sure to document and save the date, time, and the name of the person that took your information.  Exception #2 (Planned Surgery): In the event that you are scheduled by another doctor or dentist to have any type of surgery or procedure, you are allowed (for a period no longer than 30 days), to receive additional pain medication, for the acute post-op pain. However, in this case, you are responsible for picking up a copy of our Post-op Pain Management for Surgeons handout, and giving it to your surgeon or dentist. This document is available at our office, and does not require an appointment to obtain it. Simply go to our office during business hours (Monday-Thursday from 8:00 AM to 4:00 PM) (Friday 8:00 AM to 12:00 Noon) or if you have a scheduled appointment with us , prior to your surgery, and ask for it by name. In addition, you are responsible for: calling our office (  [phone], at any time of the day or night, and leaving a message stating your name, name of your surgeon, type of surgery, and date of procedure or surgery. Failure to comply with your responsibilities may result in termination of therapy involving the controlled substances.  Consequences:  Non-compliance with the above rules may result in permanent discontinuation of medication prescription therapy. All patients receiving any type of controlled substance is expected to comply with the above patient responsibilities. Not doing so may result in permanent discontinuation of medication prescription therapy. Medication Agreement Violation. Following the above rules, including your  responsibilities will help you in avoiding a Medication Agreement Violation ("Breaking your Pain Medication Contract").  *Opioid medications include: morphine, codeine , oxycodone , oxymorphone, hydrocodone , hydromorphone , meperidine, tramadol , tapentadol, buprenorphine, fentanyl , methadone. **Benzodiazepine medications include: diazepam (Valium), alprazolam (Xanax), clonazepam  (Klonopine), lorazepam (Ativan), clorazepate (Tranxene), chlordiazepoxide (Librium), estazolam (Prosom), oxazepam (Serax), temazepam (Restoril), triazolam (Halcion) (Last updated: 05/21/2023) ______________________________________________________________________     ______________________________________________________________________    Medication Recommendations and Reminders  Applies to: All patients receiving prescriptions (written and/or electronic).  Medication Rules & Regulations: You are responsible for reading, knowing, and following our Medication Rules document. These exist for your safety and that of others. They are not flexible and neither are we. Dismissing or ignoring them is an act of non-compliance that may result in complete and irreversible termination of such medication therapy. For safety reasons, non-compliance will not be tolerated. As with the [place] fundamental legal principle of ignorance of the law is no defense, we will accept no excuses for not having read and knowing the content of documents provided to you by our practice.  Pharmacy of record:  Definition: This is the pharmacy where your electronic prescriptions will be sent.  We do not endorse any particular pharmacy. It is up to you and your insurance to decide what pharmacy to use.  We do not restrict you in your choice of pharmacy. However, once we write for your prescriptions, we will NOT be re-sending more prescriptions to fix restricted supply problems created by your pharmacy, or your insurance.  The pharmacy listed in the  electronic medical record should be the one where you want electronic prescriptions to be sent. If you choose to change pharmacy, simply notify our nursing staff. Changes will be made only during your regular appointments and not over the phone.  Recommendations: Keep all of your pain medications in a safe place, under lock and key, even if you live alone. We will NOT replace lost, stolen, or damaged medication. We do not accept Police Reports as proof of medications having been stolen. After you fill your prescription, take 1 week's worth of pills and put them away in a safe place. You should keep a separate, properly labeled bottle for this purpose. The remainder should be kept in the original bottle. Use this as your primary supply, until it runs out. Once it's gone, then you know that you have 1 week's worth of medicine, and it is time to come in for a prescription refill. If you do this correctly, it is unlikely that you will ever run out of medicine. To make sure that the above recommendation works, it is very important that you make sure your medication refill appointments are scheduled at least 1 week before you run out of medicine. To do this in an effective manner, make sure that you do not leave the office without scheduling your next medication management appointment. Always ask the nursing staff to show you  in your prescription , when your medication will be running out. Then arrange for the receptionist to get you a return appointment, at least 7 days before you run out of medicine. Do not wait until you have 1 or 2 pills left, to come in. This is very poor planning and does not take into consideration that we may need to cancel appointments due to bad weather, sickness, or emergencies affecting our staff. DO NOT ACCEPT A Partial Fill: If for any reason your pharmacy does not have enough pills/tablets to completely fill or refill your prescription, do not allow for a partial fill. The law  allows the pharmacy to complete that prescription within 72 hours, without requiring a new prescription. If they do not fill the rest of your prescription within those 72 hours, you will need a separate prescription to fill the remaining amount, which we will NOT provide. If the reason for the partial fill is your insurance, you will need to talk to the pharmacist about payment alternatives for the remaining tablets, but again, DO NOT ACCEPT A PARTIAL FILL, unless you can trust your pharmacist to obtain the remainder of the pills within 72 hours.  Prescription refills and/or changes in medication(s):  Prescription refills, and/or changes in dose or medication, will be conducted only during scheduled medication management appointments. (Applies to both, written and electronic prescriptions.) No refills on procedure days. No medication will be changed or started on procedure days. No changes, adjustments, and/or refills will be conducted on a procedure day. Doing so will interfere with the diagnostic portion of the procedure. No phone refills. No medications will be called into the pharmacy. No Fax refills. No weekend refills. No Holliday refills. No after hours refills.  Remember:  Business hours are:  Monday to Thursday 8:00 AM to 4:00 PM Provider's Schedule: Eric Como, MD - Appointments are:  Medication management: Monday and Wednesday 8:00 AM to 4:00 PM Procedure day: Tuesday and Thursday 7:30 AM to 4:00 PM Wallie Sherry, MD - Appointments are:  Medication management: Tuesday and Thursday 8:00 AM to 4:00 PM Procedure day: Monday and Wednesday 7:30 AM to 4:00 PM (Last update: 05/21/2022) ______________________________________________________________________     ______________________________________________________________________    Steroid injections  Common steroids for injections Triamcinolone : Used by many sports medicine physicians for large joint and bursal injections,  often combined with a local anesthetic like lidocaine . A study focusing on coccydynia (tailbone pain) found triamcinolone  was more effective than betamethasone, suggesting it may also be preferable for other localized inflammation conditions. Methylprednisolone : A common alternative to triamcinolone  that is also a strong anti-inflammatory. It is available in different formulations, with the acetate suspension being the long-acting option for intra-articular injections. Dexamethasone : This is a non-particulate steroid, meaning it has a lower risk of tissue damage compared to particulate steroids like triamcinolone  and methylprednisolone . While less common for this specific use, it is an option for targeted injections.   Considerations for physicians Particulate vs. non-particulate steroids: Triamcinolone  and methylprednisolone  are particulate, meaning they can clump together. Dexamethasone  is non-particulate. Particulate steroids are often preferred for their longer-lasting effects but carry a theoretical higher risk for certain injections (though this is less of a concern in the costochondral joints). Combined injectate: Corticosteroids are typically mixed with a local anesthetic like lidocaine  to provide both immediate pain relief (from the anesthetic) and longer-term inflammation reduction (from the steroid). Imaging guidance: To ensure accurate placement of the needle and medication, physicians may use ultrasound or fluoroscopic guidance for the injection, especially  in complex or refractory cases.   Patient guidance Before undergoing a steroid injection, discuss the options with your physician. They will determine the best steroid, dosage, and procedure for your specific case based on factors like: Severity of your condition History of response to other treatments Your overall health status Experience and preference of the physician  Last  Updated:  03/23/2024 ______________________________________________________________________

## 2024-03-31 NOTE — Progress Notes (Signed)
 PROVIDER NOTE: Interpretation of information contained herein should be left to medically-trained personnel. Specific patient instructions are provided elsewhere under Patient Instructions section of medical record. This document was created in part using AI and STT-dictation technology, any transcriptional errors that may result from this process are unintentional.  Patient: Brittney Simpson  Service: E/M   PCP: Edman Marsa PARAS, DO  DOB: 1928/03/03  DOS: 03/31/2024  Provider: Eric DELENA Como, MD  MRN: [id number]  Delivery: Face-to-face  Specialty: Interventional Pain Management  Type: Established Patient  Setting: Ambulatory outpatient facility  Specialty designation: 09  Referring Prov.: Edman Marsa *  Location: Outpatient office facility       History of present illness (HPI) Brittney Simpson, a 89 y.o. year old female, is here today because of her Chronic pain syndrome [G89.4]. Brittney Simpson primary complain today is Back Pain (lower)  Pertinent problems: Brittney Simpson has Lumbar facet syndrome (Bilateral) (R>L); Chronic low back pain (1ry area of Pain) (Bilateral) (R>L) w/o sciatica; Spondylolisthesis of lumbosacral region (L2-3 and L5-S1); Lumbar spinal stenosis (9 mm at L3-4); Trochanteric bursitis (Right); Chronic hip pain (2ry area of Pain) (Right); Osteoarthritis of hip (Right); Chronic sacroiliac joint pain (Bilateral) (R>L); Chronic lumbar radicular pain (Right); Chronic lower extremity pain (3ry area of Pain) (Right); Myofascial pain; Spondylosis without myelopathy or radiculopathy, lumbosacral region; Ankle edema, bilateral; Abnormal MRI, lumbar spine (02/02/2020); T12 compression fracture, sequela; Lumbar facet hypertrophy; Lumbar lateral recess stenosis; Lumbar foraminal stenosis; Non-traumatic compression fracture of T7 thoracic vertebra, sequela; Chronic pain syndrome; DDD (degenerative disc disease), lumbosacral; Edema of lower extremity; Acute exacerbation  of chronic low back pain; Somatic dysfunction of sacroiliac joints (Bilateral); Other spondylosis, sacral and sacrococcygeal region; Spasm of back muscles; DDD (degenerative disc disease), thoracolumbar; Chronic low back pain (Right) w/ radicular pain (Right); Chronic headache disorder; Grade 2 (12 mm) Anterolisthesis of lumbosacral spine (L5/S1); Grade 1 Retrolisthesis of L2/L3 & L3/L4; Compression fracture of L4 lumbar vertebra, sequela; T12-L1 endplate Retropulsion; L5 Pars defect with spondylolisthesis (Bilateral); History of kyphoplasty (L4, T12, T7); Wedge compression fracture of T7-t8 vertebra, sequela; Chronic midline thoracic back pain; and History of vertebral compression fracture (T7, T12, L4) on their pertinent problem list.  Pain Assessment: Severity of Chronic pain is reported as a 3 /10. Location: Back Lower/both hips. Onset: More than a month ago. Quality: Aching, Grimacing. Timing: Constant. Modifying factor(s): nothing. Vitals:  height is 5' 3 (1.6 m) and weight is 114 lb (51.7 kg). Her temporal temperature is 97.3 F (36.3 C) (abnormal). Her blood pressure is 162/85 (abnormal) and her pulse is 65. Her respiration is 16 and oxygen saturation is 99%.  BMI: Estimated body mass index is 20.19 kg/m as calculated from the following:   Height as of this encounter: 5' 3 (1.6 m).   Weight as of this encounter: 114 lb (51.7 kg).  Last encounter: 03/08/2024. Last procedure: 03/16/2024.  Reason for encounter: both, medication management and post-procedure evaluation and assessment.   Discussed the use of AI scribe software for clinical note transcription with the patient, who gave verbal consent to proceed.  History of Present Illness   Brittney Simpson is a 89 year old female with chronic lower back pain who presents for pain management follow-up.  She experiences significant improvement in her lower back pain. Previously located at the L2-3 level, recent x-rays reveal a crack in the  vertebral body at the L5 level.  She currently takes tramadol  for pain management, which is effective. Hydrocodone  was  previously prescribed but was too strong and not well-tolerated. She has returned to tramadol , which she finds more manageable.  There are no issues with her current medications, and her pharmacy remains unchanged.      RTCB: 10/16/2024   Post-Procedure Evaluation   Type: Lumbar epidural steroid injection (LESI) (interlaminar) #1    Laterality: Midline   Level:  L5-S1 Level.  Imaging: Fluoroscopic guidance Spinal ([id number]) Anesthesia: Local anesthesia (1-2% Lidocaine ) Anxiolysis: None                 Sedation: No Sedation                       DOS: 03/16/2024  Performed by: Eric DELENA Como, MD  Purpose: Diagnostic/Therapeutic Indications: Lumbar radicular pain of intraspinal etiology of more than 4 weeks that has failed to respond to conservative therapy and is severe enough to impact quality of life or function. 1. Chronic low back pain (1ry area of Pain) (Bilateral) (R>L) w/o sciatica   2. Chronic lumbar radicular pain (Right)   3. Degeneration of intervertebral disc of lumbosacral region, unspecified whether pain present   4. Compression fracture of L4 lumbar vertebra, sequela   5. Grade 2 (12 mm) Anterolisthesis of lumbosacral spine (L5/S1)   6. Grade 1 Retrolisthesis of L2/L3 & L3/L4   7. History of kyphoplasty (L4, T12, T7)   8. History of vertebral compression fracture (T7, T12, L4)   9. L5 Pars defect with spondylolisthesis (Bilateral)   10. Spinal stenosis of lumbar region with neurogenic claudication   11. T12 compression fracture, sequela   12. T12-L1 endplate Retropulsion    NAS-11 Pain score:   Pre-procedure: 10-Worst pain ever/10   Post-procedure: 0-No pain/10     Effectiveness:  Initial hour after procedure: 100 %. Subsequent 4-6 hours post-procedure: 100 %. Analgesia past initial 6 hours: 80 %. Ongoing improvement:  Analgesic: The  patient indicates having attained an ongoing 80% improvement of her back pain. Function: Brittney Simpson reports improvement in function ROM: Brittney Simpson reports improvement in ROM  Pharmacotherapy Assessment   Opioid Analgesic: Tramadol  50 mg, 1 tab PO q 8 hrs (150 mg/day of tramadol ) MME/day: 15 mg/day.   Monitoring: [place] PMP: PDMP reviewed during this encounter.       Pharmacotherapy: No side-effects or adverse reactions reported. Compliance: No problems identified. Effectiveness: Clinically acceptable.  Shela Reda CROME, RN  03/31/2024  9:40 AM  Sign when Signing Visit Nursing Pain Medication Assessment:  Safety precautions to be maintained throughout the outpatient stay will include: orient to surroundings, keep bed in low position, maintain call bell within reach at all times, provide assistance with transfer out of bed and ambulation.  Medication Inspection Compliance: Pill count conducted under aseptic conditions, in front of the patient. Neither the pills nor the bottle was removed from the patient's sight at any time. Once count was completed pills were immediately returned to the patient in their original bottle.  Medication: Tramadol  (Ultram ) Pill/Patch Count: 58 of 90 pills/patches remain Pill/Patch Appearance: Markings consistent with prescribed medication Bottle Appearance: Standard pharmacy container. Clearly labeled. Filled Date: 03 / 19 / 2025 Last Medication intake:  Yesterday    UDS:  Summary  Date Value Ref Range Status  04/07/2023 Note  Final    Comment:    ==================================================================== [employer] 13 (MW) ==================================================================== Test  Result       Flag       Units  Drug Present and Declared for Prescription Verification   7-aminoclonazepam              255          EXPECTED   ng/mg creat    7-aminoclonazepam is an expected metabolite of  clonazepam . Source of    clonazepam  is a scheduled prescription medication.    Tramadol                        >25000       EXPECTED   ng/mg creat   O-Desmethyltramadol            >25000       EXPECTED   ng/mg creat   N-Desmethyltramadol            7320         EXPECTED   ng/mg creat    Source of tramadol  is a prescription medication. O-desmethyltramadol    and N-desmethyltramadol are expected metabolites of tramadol .  ==================================================================== Test                      Result    Flag   Units      Ref Range   Creatinine              20               mg/dL      >=79 ==================================================================== Declared Medications:  The flagging and interpretation on this report are based on the  following declared medications.  Unexpected results may arise from  inaccuracies in the declared medications.   **Note: The testing scope of this panel includes these medications:   Clonazepam  (Klonopin )  Tramadol  (Ultram )   **Note: The testing scope of this panel does not include the  following reported medications:   Acetaminophen  (Tylenol )  Amlodipine  (Norvasc )  Cranberry  Eye Drops  Furosemide  (Lasix )  Levothyroxine  (Synthroid )  Lisinopril  (Zestril )  Melatonin  Methocarbamol  (Robaxin )  Metoprolol  (Toprol )  Naloxone  (Narcan )  Omeprazole  (Prilosec)  Tamsulosin  (Flomax )  Trazodone  (Desyrel )  Vitamin D3 ==================================================================== For clinical consultation, please call [phone]. ====================================================================     No results found for: CBDTHCR No results found for: D8THCCBX No results found for: D9THCCBX  ROS  Constitutional: Denies any fever or chills Gastrointestinal: No reported hemesis, hematochezia, vomiting, or acute GI distress Musculoskeletal: Denies any acute onset joint swelling, redness, loss of ROM, or  weakness Neurological: No reported episodes of acute onset apraxia, aphasia, dysarthria, agnosia, amnesia, paralysis, loss of coordination, or loss of consciousness  Medication Review  Amoxicillin , Cranberry, Melatonin, Vitamin D3, acetaminophen , alendronate , amLODipine , brimonidine, clonazePAM , furosemide , latanoprost, levothyroxine , lisinopril , metoprolol  succinate, omeprazole , tamsulosin , traMADol , and traZODone   History Review  Allergy: Brittney Simpson has no known allergies. Drug: Brittney Simpson  reports no history of drug use. Alcohol:  reports no history of alcohol use. Tobacco:  reports that she has never smoked. She has never used smokeless tobacco. Social: Brittney Simpson  reports that she has never smoked. She has never used smokeless tobacco. She reports that she does not drink alcohol and does not use drugs. Medical:  has a past medical history of Anxiety, Arthritis, Back ache, Hiatal hernia, Hyperlipidemia, Hypertension, Hypothyroid, Osteopenia, Thyroid  disease, and UTI (urinary tract infection). Surgical: Brittney Simpson  has a past surgical history that includes Hernia repair; Hernia repair; Hemorrhoid surgery; Kyphoplasty (N/A, 01/23/2017);  Kyphoplasty (N/A, 01/25/2020); and Cholecystectomy. Family: family history includes Cancer in her father; Stroke in her mother.  Laboratory Chemistry Profile   Renal Lab Results  Component Value Date   BUN 9 04/21/2023   CREATININE 0.79 04/21/2023   BCR SEE NOTE: 04/21/2023   GFRAA 76 05/16/2020   GFRNONAA 66 05/16/2020    Hepatic Lab Results  Component Value Date   AST 8 (L) 04/21/2023   ALT 6 04/21/2023   ALBUMIN 3.6 11/22/2016   ALKPHOS 59 11/22/2016   LIPASE 21 06/14/2016    Electrolytes Lab Results  Component Value Date   NA 137 04/21/2023   K 4.2 04/21/2023   CL 99 04/21/2023   CALCIUM 9.3 04/21/2023   MG 2.1 01/14/2014    Bone No results found for: VD25OH, VD125OH2TOT, CI6874NY7, CI7874NY7, 25OHVITD1, 25OHVITD2,  25OHVITD3, TESTOFREE, TESTOSTERONE  Inflammation (CRP: Acute Phase) (ESR: Chronic Phase) No results found for: CRP, ESRSEDRATE, LATICACIDVEN       Note: Above Lab results reviewed.  Recent Imaging Review  DG PAIN CLINIC C-ARM 1-60 MIN NO REPORT Fluoro was used, but no Radiologist interpretation will be provided.  Please refer to NOTES tab for provider progress note. Note: Reviewed        Physical Exam  Vitals: BP (!) 162/85 (Cuff Size: Normal)   Pulse 65   Temp (!) 97.3 F (36.3 C) (Temporal)   Resp 16   Ht 5' 3 (1.6 m)   Wt 114 lb (51.7 kg)   SpO2 99%   BMI 20.19 kg/m  BMI: Estimated body mass index is 20.19 kg/m as calculated from the following:   Height as of this encounter: 5' 3 (1.6 m).   Weight as of this encounter: 114 lb (51.7 kg). Ideal: Ideal body weight: 52.4 kg (115 lb 8.3 oz) General appearance: Well nourished, well developed, and well hydrated. In no apparent acute distress Mental status: Alert, oriented x 3 (person, place, & time)       Respiratory: No evidence of acute respiratory distress Eyes: PERLA   Assessment   Diagnosis Status  1. Chronic pain syndrome   2. Chronic lower extremity pain (3ry area of Pain) (Right)   3. Chronic hip pain (2ry area of Pain) (Right)   4. T12 compression fracture, sequela   5. Chronic low back pain (1ry area of Pain) (Bilateral) (R>L) w/o sciatica   6. Non-traumatic compression fracture of T7 thoracic vertebra, sequela   7. DDD (degenerative disc disease), thoracolumbar   8. Chronic sacroiliac joint pain (Bilateral) (R>L)   9. Lumbar facet syndrome (Bilateral) (R>L)   10. Pharmacologic therapy   11. Chronic use of opiate for therapeutic purpose   12. Encounter for medication management   13. Encounter for chronic pain management   14. Chronic lumbar radicular pain (Right)   15. Postop check    Controlled Controlled Controlled   Updated Problems: No problems updated.  Plan of Care   Problem-specific:  Assessment and Plan    Chronic low back pain with vertebral fracture at L5   Chronic low back pain stems from a vertebral fracture at L5. Previous treatment at L2-3 was ineffective. X-rays confirmed the L5 fracture, prompting a treatment shift to the base of the spine, achieving about 80% improvement. Continue the current pain management regimen with tramadol . Send six months of tramadol  refills to the pharmacy. Place a PRN order for epidural injections, ensuring the pain location is identified before administration.  Chronic pain syndrome   Chronic pain syndrome is  managed with tramadol . Hydrocodone  was previously trialed but was too strong, leading to a return to tramadol , which is effective and well-tolerated. A nurse practitioner will assist with medication management. Continue tramadol  as the primary pain management medication. Schedule a follow-up in six months for medication refill with the nurse practitioner.       Brittney Simpson has a current medication list which includes the following long-term medication(s): amlodipine , clonazepam , furosemide , levothyroxine , lisinopril , metoprolol  succinate, omeprazole , trazodone , and [START ON 04/19/2024] tramadol .  Pharmacotherapy (Medications Ordered): Meds ordered this encounter  Medications   traMADol  (ULTRAM ) 50 MG tablet    Sig: Take 1 tablet (50 mg total) by mouth 3 (three) times daily. Each refill must last 30 days.    Dispense:  90 tablet    Refill:  5    DO NOT: delete (not duplicate); no partial-fill (will deny script to complete), no renewal request (F/U required). DISPENSE: 1 day early if closed on fill date. WARN: No CNS-depressants within 8 hrs of med.   Orders:  Orders Placed This Encounter  Procedures   Nursing Instructions:    Please complete this patient's postprocedure evaluation.    Scheduling Instructions:     Please complete this patient's postprocedure evaluation.     Interventional  Therapies  Risk  Complexity Considerations:   Advanced age  HOH  AAA  HTN  Stage3 CKD  GERD   Note: Hard of hearing   Planned  Pending:   Therapeutic ML-right L5-S1 LESI #1    Under consideration:   (PRN) palliative right L1-2 LESI #14    Completed:   Palliative right L1-2 LESI x13 (02/17/2024) (previous: 100/100/75/75)  Therapeutic right L2-3 LESI x1 (03/07/2020) (100/100/25/<50)  Diagnostic/therapeutic bilateral SI joint injection x1 (02/10/2020) (100/100/100 x2 days/0)  Palliative right lumbar facet MBB x13 (01/04/2020) (8 to 0) (100/100/100)  Palliative left lumbar facet MBB x11 (01/04/2020) (8 to 0) (100/100/100)    Therapeutic  Palliative (PRN) options:   Palliative right T12-L1 LESI (PRN)   Pharmacotherapy  Nonopioids transferred 04/11/2020: Robaxin       Return in about 7 months (around 10/16/2024) for (Face2F), (MM), w/ Emmy Blanch, NP.    Recent Visits Date Type Provider Dept  03/16/24 Procedure visit Tanya Glisson, MD [facility]  03/08/24 Office Visit Tanya Glisson, MD [facility]  02/17/24 Procedure visit Tanya Glisson, MD [facility]  01/21/24 Office Visit Tanya Glisson, MD [facility]  01/06/24 Procedure visit Tanya Glisson, MD [facility]  Showing recent visits within past 90 days and meeting all other requirements Today's Visits Date Type Provider Dept  03/31/24 Office Visit Tanya Glisson, MD [facility]  Showing today's visits and meeting all other requirements Future Appointments No visits were found meeting these conditions. Showing future appointments within next 90 days and meeting all other requirements  I discussed the assessment and treatment plan with the patient. The patient was provided an opportunity to ask questions and all were answered. The patient agreed with the plan and demonstrated an understanding of the instructions.  Patient advised to call back  or seek an in-person evaluation if the symptoms or condition worsens.  Duration of encounter: 30 minutes.  Total time on encounter, as per AMA guidelines included both the face-to-face and non-face-to-face time personally spent by the physician and/or other qualified health care professional(s) on the day of the encounter (includes time in activities that require the physician or other qualified health care professional and does not include  time in activities normally performed by clinical staff). Physician's time may include the following activities when performed: Preparing to see the patient (e.g., pre-charting review of records, searching for previously ordered imaging, lab work, and nerve conduction tests) Review of prior analgesic pharmacotherapies. Reviewing PMP Interpreting ordered tests (e.g., lab work, imaging, nerve conduction tests) Performing post-procedure evaluations, including interpretation of diagnostic procedures Obtaining and/or reviewing separately obtained history Performing a medically appropriate examination and/or evaluation Counseling and educating the patient/family/caregiver Ordering medications, tests, or procedures Referring and communicating with other health care professionals (when not separately reported) Documenting clinical information in the electronic or other health record Independently interpreting results (not separately reported) and communicating results to the patient/ family/caregiver Care coordination (not separately reported)  Note by: Eric DELENA Como, MD (TTS and AI technology used. I apologize for any typographical errors that were not detected and corrected.) Date: 03/31/2024; Time: 10:14 AM

## 2024-03-31 NOTE — Progress Notes (Signed)
 Nursing Pain Medication Assessment:  Safety precautions to be maintained throughout the outpatient stay will include: orient to surroundings, keep bed in low position, maintain call bell within reach at all times, provide assistance with transfer out of bed and ambulation.  Medication Inspection Compliance: Pill count conducted under aseptic conditions, in front of the patient. Neither the pills nor the bottle was removed from the patient's sight at any time. Once count was completed pills were immediately returned to the patient in their original bottle.  Medication: Tramadol  (Ultram ) Pill/Patch Count: 58 of 90 pills/patches remain Pill/Patch Appearance: Markings consistent with prescribed medication Bottle Appearance: Standard pharmacy container. Clearly labeled. Filled Date: 03 / 19 / 2025 Last Medication intake:  Yesterday

## 2024-04-23 ENCOUNTER — Other Ambulatory Visit: Payer: Self-pay | Admitting: Family Medicine

## 2024-04-23 DIAGNOSIS — R7309 Other abnormal glucose: Secondary | ICD-10-CM

## 2024-04-23 DIAGNOSIS — E034 Atrophy of thyroid (acquired): Secondary | ICD-10-CM

## 2024-04-23 DIAGNOSIS — Z0000 Encounter for general adult medical examination without abnormal findings: Secondary | ICD-10-CM

## 2024-04-23 DIAGNOSIS — I129 Hypertensive chronic kidney disease with stage 1 through stage 4 chronic kidney disease, or unspecified chronic kidney disease: Secondary | ICD-10-CM

## 2024-04-23 DIAGNOSIS — M810 Age-related osteoporosis without current pathological fracture: Secondary | ICD-10-CM

## 2024-04-26 ENCOUNTER — Other Ambulatory Visit

## 2024-04-26 DIAGNOSIS — Z0000 Encounter for general adult medical examination without abnormal findings: Secondary | ICD-10-CM | POA: Diagnosis not present

## 2024-04-26 DIAGNOSIS — I129 Hypertensive chronic kidney disease with stage 1 through stage 4 chronic kidney disease, or unspecified chronic kidney disease: Secondary | ICD-10-CM | POA: Diagnosis not present

## 2024-04-26 DIAGNOSIS — R7309 Other abnormal glucose: Secondary | ICD-10-CM | POA: Diagnosis not present

## 2024-04-26 DIAGNOSIS — E034 Atrophy of thyroid (acquired): Secondary | ICD-10-CM | POA: Diagnosis not present

## 2024-04-26 DIAGNOSIS — N183 Chronic kidney disease, stage 3 unspecified: Secondary | ICD-10-CM | POA: Diagnosis not present

## 2024-04-27 ENCOUNTER — Ambulatory Visit: Payer: Self-pay | Admitting: Family Medicine

## 2024-04-27 LAB — HEMOGLOBIN A1C
Hgb A1c MFr Bld: 5.6 % (ref <5.7)
Mean Plasma Glucose: 114 mg/dL
eAG (mmol/L): 6.3 mmol/L

## 2024-04-27 LAB — COMPREHENSIVE METABOLIC PANEL WITH GFR
AG Ratio: 1.3 (calc) (ref 1.0–2.5)
ALT: 7 U/L (ref 6–29)
AST: 9 U/L — ABNORMAL LOW (ref 10–35)
Albumin: 3.9 g/dL (ref 3.6–5.1)
Alkaline phosphatase (APISO): 53 U/L (ref 37–153)
BUN: 11 mg/dL (ref 7–25)
CO2: 30 mmol/L (ref 20–32)
Calcium: 9.1 mg/dL (ref 8.6–10.4)
Chloride: 100 mmol/L (ref 98–110)
Creat: 0.79 mg/dL (ref 0.60–0.95)
Globulin: 2.9 g/dL (ref 1.9–3.7)
Glucose, Bld: 97 mg/dL (ref 65–99)
Potassium: 4.5 mmol/L (ref 3.5–5.3)
Sodium: 136 mmol/L (ref 135–146)
Total Bilirubin: 0.5 mg/dL (ref 0.2–1.2)
Total Protein: 6.8 g/dL (ref 6.1–8.1)
eGFR: 69 mL/min/1.73m2 (ref >=60)

## 2024-04-27 LAB — CBC WITH DIFFERENTIAL/PLATELET
Absolute Lymphocytes: 1546 {cells}/uL (ref 850–3900)
Absolute Monocytes: 547 {cells}/uL (ref 200–950)
Basophils Absolute: 29 {cells}/uL (ref 0–200)
Basophils Relative: 0.6 %
Eosinophils Absolute: 0 {cells}/uL — ABNORMAL LOW (ref 15–500)
Eosinophils Relative: 0 %
HCT: 36.9 % (ref 35.0–45.0)
Hemoglobin: 11 g/dL — ABNORMAL LOW (ref 11.7–15.5)
MCH: 24.6 pg — ABNORMAL LOW (ref 27.0–33.0)
MCHC: 29.8 g/dL — ABNORMAL LOW (ref 32.0–36.0)
MCV: 82.6 fL (ref 80.0–100.0)
MPV: 10.5 fL (ref 7.5–12.5)
Monocytes Relative: 11.4 %
Neutro Abs: 2678 {cells}/uL (ref 1500–7800)
Neutrophils Relative %: 55.8 %
Platelets: 207 Thousand/uL (ref 140–400)
RBC: 4.47 Million/uL (ref 3.80–5.10)
RDW: 15.3 % — ABNORMAL HIGH (ref 11.0–15.0)
Total Lymphocyte: 32.2 %
WBC: 4.8 Thousand/uL (ref 3.8–10.8)

## 2024-04-27 LAB — LIPID PANEL
Cholesterol: 199 mg/dL (ref <200)
HDL: 39 mg/dL — ABNORMAL LOW (ref >=50)
LDL Cholesterol (Calc): 133 mg/dL — ABNORMAL HIGH
Non-HDL Cholesterol (Calc): 160 mg/dL — ABNORMAL HIGH (ref <130)
Total CHOL/HDL Ratio: 5.1 (calc) — ABNORMAL HIGH (ref <5.0)
Triglycerides: 144 mg/dL (ref <150)

## 2024-04-27 LAB — TSH: TSH: 2.09 m[IU]/L (ref 0.40–4.50)

## 2024-04-27 LAB — T4, FREE: Free T4: 1.5 ng/dL (ref 0.8–1.8)

## 2024-04-28 ENCOUNTER — Other Ambulatory Visit: Payer: Self-pay | Admitting: Family Medicine

## 2024-04-28 DIAGNOSIS — I129 Hypertensive chronic kidney disease with stage 1 through stage 4 chronic kidney disease, or unspecified chronic kidney disease: Secondary | ICD-10-CM

## 2024-04-29 ENCOUNTER — Telehealth: Payer: Self-pay | Admitting: Family Medicine

## 2024-04-29 ENCOUNTER — Other Ambulatory Visit: Payer: Self-pay | Admitting: Family Medicine

## 2024-04-29 DIAGNOSIS — I129 Hypertensive chronic kidney disease with stage 1 through stage 4 chronic kidney disease, or unspecified chronic kidney disease: Secondary | ICD-10-CM

## 2024-04-29 NOTE — Telephone Encounter (Signed)
 Copied from CRM [id number]. Topic: Clinical - Medication Refill >> Apr 29, 2024  2:13 PM Antony S wrote: Medication: furosemide  (LASIX ) 20 MG tablet  Has the patient contacted their pharmacy? Yes (Agent: If no, request that the patient contact the pharmacy for the refill. If patient does not wish to contact the pharmacy document the reason why and proceed with request.) (Agent: If yes, when and what did the pharmacy advise?)  This is the patient's preferred pharmacy:  [facility] - [place], [place] - [address] [place] [address] Phone: [phone] Fax: [phone]  Is this the correct pharmacy for this prescription? Yes If no, delete pharmacy and type the correct one.   Has the prescription been filled recently? No  Is the patient out of the medication? No, by friday  Has the patient been seen for an appointment in the last year OR does the patient have an upcoming appointment? Yes  Can we respond through MyChart? Yes  Agent: Please be advised that Rx refills may take up to 3 business days. We ask that you follow-up with your pharmacy.

## 2024-04-29 NOTE — Telephone Encounter (Unsigned)
 Copied from CRM [id number]. Topic: Clinical - Medication Refill >> Apr 29, 2024  2:13 PM Antony S wrote: Medication: furosemide  (LASIX ) 20 MG tablet  Has the patient contacted their pharmacy? Yes (Agent: If no, request that the patient contact the pharmacy for the refill. If patient does not wish to contact the pharmacy document the reason why and proceed with request.) (Agent: If yes, when and what did the pharmacy advise?)  This is the patient's preferred pharmacy:  [facility] - [place], [place] - [address] [place] [address] Phone: [phone] Fax: [phone]  Is this the correct pharmacy for this prescription? Yes If no, delete pharmacy and type the correct one.   Has the prescription been filled recently? No  Is the patient out of the medication? No, by friday  Has the patient been seen for an appointment in the last year OR does the patient have an upcoming appointment? Yes  Can we respond through MyChart? Yes  Agent: Please be advised that Rx refills may take up to 3 business days. We ask that you follow-up with your pharmacy.

## 2024-04-29 NOTE — Telephone Encounter (Signed)
 Requested medications are due for refill today.  yes  Requested medications are on the active medications list.  yes  Last refill. 01/29/2024 #135 0 rf  Future visit scheduled.   no  Notes to clinic.  Expired magnesium  lab    Requested Prescriptions  Pending Prescriptions Disp Refills   furosemide  (LASIX ) 20 MG tablet [Pharmacy Med Name: FUROSEMIDE  20 MG TAB] 135 tablet 0    Sig: TAKE 1 and 1/2 TABLETS BY MOUTH ONCE DAILY     Cardiovascular:  Diuretics - Loop Failed - 04/29/2024  5:54 PM      Failed - Mg Level in normal range and within 180 days    Magnesium   Date Value Ref Range Status  01/14/2014 2.1 mg/dL Final    Comment:    8.1-7.5 THERAPEUTIC RANGE: 4-7 mg/dL TOXIC: > 10 mg/dL  -----------------------          Failed - Last BP in normal range    BP Readings from Last 1 Encounters:  03/31/24 (!) 162/85         Passed - K in normal range and within 180 days    Potassium  Date Value Ref Range Status  04/26/2024 4.5 3.5 - 5.3 mmol/L Final  11/24/2014 4.2 mmol/L Final    Comment:    3.5-5.1 NOTE: New Reference Range  10/04/14          Passed - Ca in normal range and within 180 days    Calcium  Date Value Ref Range Status  04/26/2024 9.1 8.6 - 10.4 mg/dL Final   Calcium, Total  Date Value Ref Range Status  11/24/2014 8.9 mg/dL Final    Comment:    1.0-89.6 NOTE: New Reference Range  10/04/14          Passed - Na in normal range and within 180 days    Sodium  Date Value Ref Range Status  04/26/2024 136 135 - 146 mmol/L Final  11/24/2014 131 (L) mmol/L Final    Comment:    135-145 NOTE: New Reference Range  10/04/14          Passed - Cr in normal range and within 180 days    Creat  Date Value Ref Range Status  04/26/2024 0.79 0.60 - 0.95 mg/dL Final         Passed - Cl in normal range and within 180 days    Chloride  Date Value Ref Range Status  04/26/2024 100 98 - 110 mmol/L Final  11/24/2014 97 (L) mmol/L Final    Comment:     101-111 NOTE: New Reference Range  10/04/14          Passed - Valid encounter within last 6 months    Recent Outpatient Visits           1 month ago Chronic pain syndrome   [place] [facility] [place] Marsa PARAS, DO       Future Appointments             In 2 weeks Raymund, Lauraine BROCKS, MD [place] [facility]   In 1 month MacDiarmid, Glendia, MD [facility] [place]

## 2024-04-30 NOTE — Telephone Encounter (Signed)
 Requested by interface surescripts. Duplicate request. Receipt confirmed by pharmacy 04/30/24 at 7:55 am.  Requested Prescriptions  Refused Prescriptions Disp Refills   furosemide  (LASIX ) 20 MG tablet 135 tablet 0    Sig: Take 1.5 tablets (30 mg total) by mouth daily.     Cardiovascular:  Diuretics - Loop Failed - 04/30/2024  2:31 PM      Failed - Mg Level in normal range and within 180 days    Magnesium   Date Value Ref Range Status  01/14/2014 2.1 mg/dL Final    Comment:    8.1-7.5 THERAPEUTIC RANGE: 4-7 mg/dL TOXIC: > 10 mg/dL  -----------------------          Failed - Last BP in normal range    BP Readings from Last 1 Encounters:  03/31/24 (!) 162/85         Passed - K in normal range and within 180 days    Potassium  Date Value Ref Range Status  04/26/2024 4.5 3.5 - 5.3 mmol/L Final  11/24/2014 4.2 mmol/L Final    Comment:    3.5-5.1 NOTE: New Reference Range  10/04/14          Passed - Ca in normal range and within 180 days    Calcium  Date Value Ref Range Status  04/26/2024 9.1 8.6 - 10.4 mg/dL Final   Calcium, Total  Date Value Ref Range Status  11/24/2014 8.9 mg/dL Final    Comment:    1.0-89.6 NOTE: New Reference Range  10/04/14          Passed - Na in normal range and within 180 days    Sodium  Date Value Ref Range Status  04/26/2024 136 135 - 146 mmol/L Final  11/24/2014 131 (L) mmol/L Final    Comment:    135-145 NOTE: New Reference Range  10/04/14          Passed - Cr in normal range and within 180 days    Creat  Date Value Ref Range Status  04/26/2024 0.79 0.60 - 0.95 mg/dL Final         Passed - Cl in normal range and within 180 days    Chloride  Date Value Ref Range Status  04/26/2024 100 98 - 110 mmol/L Final  11/24/2014 97 (L) mmol/L Final    Comment:    101-111 NOTE: New Reference Range  10/04/14          Passed - Valid encounter within last 6 months    Recent Outpatient Visits           1 month ago Chronic pain  syndrome   [place] [facility] [place] Marsa PARAS, DO       Future Appointments             In 2 weeks Raymund, Lauraine BROCKS, MD [place] [facility]   In 1 month MacDiarmid, Glendia, MD [facility] [place]

## 2024-05-04 ENCOUNTER — Ambulatory Visit (INDEPENDENT_AMBULATORY_CARE_PROVIDER_SITE_OTHER)

## 2024-05-04 DIAGNOSIS — Z23 Encounter for immunization: Secondary | ICD-10-CM

## 2024-05-12 ENCOUNTER — Other Ambulatory Visit: Payer: Self-pay | Admitting: Family Medicine

## 2024-05-12 DIAGNOSIS — K219 Gastro-esophageal reflux disease without esophagitis: Secondary | ICD-10-CM

## 2024-05-14 NOTE — Telephone Encounter (Signed)
 Requested Prescriptions  Pending Prescriptions Disp Refills   omeprazole  (PRILOSEC) 20 MG capsule [Pharmacy Med Name: OMEPRAZOLE  DR 20 MG CAP] 90 capsule 0    Sig: TAKE 1 CAPSULE BY MOUTH ONCE DAILY BEFORE BREAKFAST     Gastroenterology: Proton Pump Inhibitors Passed - 05/14/2024  1:05 PM      Passed - Valid encounter within last 12 months    Recent Outpatient Visits           2 months ago Chronic pain syndrome   [place] [facility] [place], Marsa PARAS, DO       Future Appointments             In 5 days Raymund, Lauraine BROCKS, MD [facility]   In 2 weeks MacDiarmid, Glendia, MD [place] [facility]

## 2024-05-19 ENCOUNTER — Ambulatory Visit

## 2024-05-19 DIAGNOSIS — W908XXA Exposure to other nonionizing radiation, initial encounter: Secondary | ICD-10-CM

## 2024-05-19 DIAGNOSIS — I872 Venous insufficiency (chronic) (peripheral): Secondary | ICD-10-CM | POA: Diagnosis not present

## 2024-05-19 DIAGNOSIS — L570 Actinic keratosis: Secondary | ICD-10-CM | POA: Diagnosis not present

## 2024-05-19 MED ORDER — TRIAMCINOLONE ACETONIDE 0.1 % EX OINT: TOPICAL_OINTMENT | CUTANEOUS | 0 refills | Status: AC

## 2024-05-19 NOTE — Patient Instructions (Addendum)
 Start triamcinolone  ointment 0.1% twice daily to affected areas of skin Discussed side effect of potent topical steroids including atrophy, dyspigmentation, striae, telangectasia, folliculitis, loss of skin pigment, hair growth, tachyphylaxis, risk of systemic absorption with missuse.  Cryosurgery  Cryosurgery ("freezing") uses liquid nitrogen to destroy certain types of skin lesions. Lowering the temperature of the lesion in a small area surrounding skin destroys the lesion. Immediately following cryosurgery, you will notice redness and swelling of the treatment area. Blistering or weeping may occur, lasting approximately one week which will then be followed by crusting. Most areas will heal completely in 10 to 14 days.  Wash the treated areas daily. Allow soap and water to run over the areas, but do not scrub. Should a scab or crust form, allow it to fall off on its own. Do not remove or pick at it. Application of an ointment  and a bandage may make you feel more comfortable, but it is not necessary. Some people develop an allergy to Neosporin, so we recommend that Vaseline or  Aquaphor be used.  The cryotherapy site will be more sensitive than your surrounding skin. Keep it covered, and remember to apply sunscreen every day to all your sun exposed skin. A scar may remain which is lighter or pinker than your normal skin. Your body will continue to improve your scar for up to one year; however a light-colored scar may remain.  Infection following cryotherapy is rare. However if you are worried about the appearance of the treated area, contact your doctor. We have a physician on call at all times. If you have any concerns about the site, please call our clinic at [phone]    Due to recent changes in healthcare laws, you may see results of your pathology and/or laboratory studies on MyChart before the doctors have had a chance to review them. We understand that in some cases there may be results  that are confusing or concerning to you. Please understand that not all results are received at the same time and often the doctors may need to interpret multiple results in order to provide you with the best plan of care or course of treatment. Therefore, we ask that you please give us  2 business days to thoroughly review all your results before contacting the office for clarification. Should we see a critical lab result, you will be contacted sooner.   If You Need Anything After Your Visit  If you have any questions or concerns for your doctor, please call our main line at [phone] and press option 4 to reach your doctor's medical assistant. If no one answers, please leave a voicemail as directed and we will return your call as soon as possible. Messages left after 4 pm will be answered the following business day.   You may also send us  a message via MyChart. We typically respond to MyChart messages within 1-2 business days.  For prescription refills, please ask your pharmacy to contact our office. Our fax number is [phone].  If you have an urgent issue when the clinic is closed that cannot wait until the next business day, you can page your doctor at the number below.    Please note that while we do our best to be available for urgent issues outside of office hours, we are not available 24/7.   If you have an urgent issue and are unable to reach us , you may choose to seek medical care at your doctor's office, retail clinic, urgent care center,  or emergency room.  If you have a medical emergency, please immediately call 911 or go to the emergency department.  Pager Numbers  - Dr. Hester: [phone]  - Dr. Jackquline: [phone]  - Dr. Claudene: [phone]   In the event of inclement weather, please call our main line at [phone] for an update on the status of any delays or closures.  Dermatology Medication Tips: Please keep the boxes that topical medications come in in  order to help keep track of the instructions about where and how to use these. Pharmacies typically print the medication instructions only on the boxes and not directly on the medication tubes.   If your medication is too expensive, please contact our office at [phone] option 4 or send us  a message through MyChart.   We are unable to tell what your co-pay for medications will be in advance as this is different depending on your insurance coverage. However, we may be able to find a substitute medication at lower cost or fill out paperwork to get insurance to cover a needed medication.   If a prior authorization is required to get your medication covered by your insurance company, please allow us  1-2 business days to complete this process.  Drug prices often vary depending on where the prescription is filled and some pharmacies may offer cheaper prices.  The website www.goodrx.com contains coupons for medications through different pharmacies. The prices here do not account for what the cost may be with help from insurance (it may be cheaper with your insurance), but the website can give you the price if you did not use any insurance.  - You can print the associated coupon and take it with your prescription to the pharmacy.  - You may also stop by our office during regular business hours and pick up a GoodRx coupon card.  - If you need your prescription sent electronically to a different pharmacy, notify our office through [facility] or by phone at [phone] option 4.     Si Usted Necesita Algo Despus de Su Visita  Tambin puede enviarnos un mensaje a travs de Clinical cytogeneticist. Por lo general respondemos a los mensajes de MyChart en el transcurso de 1 a 2 das hbiles.  Para renovar recetas, por favor pida a su farmacia que se ponga en contacto con nuestra oficina. Randi lakes de fax es [place] [phone].  Si tiene un asunto urgente cuando la clnica est cerrada y que no puede  esperar hasta el siguiente da hbil, puede llamar/localizar a su doctor(a) al nmero que aparece a continuacin.   Por favor, tenga en cuenta que aunque hacemos todo lo posible para estar disponibles para asuntos urgentes fuera del horario de [place], no estamos disponibles las 24 horas del da, los 7 [address]  [address] de la [place].   Si tiene un problema urgente y no puede comunicarse con nosotros, puede optar por buscar atencin mdica  en el consultorio de su doctor(a), en una clnica privada, en un centro de atencin urgente o en una sala de emergencias.  Si tiene Engineer, drilling, por favor llame inmediatamente al 911 o vaya a la sala de emergencias.  Nmeros de bper  - Dr. Hester: [phone]  - Dra. Jackquline: [phone]  - Dr. Claudene: [phone]   En caso de inclemencias del tiempo, por favor llame a landry capes principal al [phone] para una actualizacin sobre el [place] de cualquier retraso o cierre.  Consejos para la medicacin en dermatologa: Por favor, guarde las cajas en  las que vienen los medicamentos de uso tpico para ayudarle a seguir las instrucciones sobre dnde y cmo usarlos. Las farmacias generalmente imprimen las instrucciones del medicamento slo en las cajas y no directamente en los tubos del [place].   Si su medicamento es muy caro, por favor, pngase en contacto con landry rieger llamando al [phone] y presione la opcin 4 o envenos un mensaje a travs de Clinical cytogeneticist.   No podemos decirle cul ser su copago por los medicamentos por adelantado ya que esto es diferente dependiendo de la cobertura de su seguro. Sin embargo, es posible que podamos encontrar un medicamento sustituto a Audiological scientist un formulario para que el seguro cubra el medicamento que se considera necesario.   Si se requiere una autorizacin previa para que su compaa de seguros malta su medicamento, por favor permtanos de 1 a 2 das hbiles para completar este proceso.  Los  precios de los medicamentos varan con frecuencia dependiendo del Environmental consultant de dnde se surte la receta y alguna farmacias pueden ofrecer precios ms baratos.  El sitio web www.goodrx.com tiene cupones para medicamentos de Health and safety inspector. Los precios aqu no tienen en cuenta lo que podra costar con la ayuda del seguro (puede ser ms barato con su seguro), pero el sitio web puede darle el precio si no utiliz Tourist information centre manager.  - Puede imprimir el cupn correspondiente y llevarlo con su receta a la farmacia.  - Tambin puede pasar por nuestra oficina durante el horario de atencin regular y Education officer, museum una tarjeta de cupones de GoodRx.  - Si necesita que su receta se enve electrnicamente a una farmacia diferente, informe a nuestra oficina a travs de MyChart de [place] o por telfono llamando al [phone] y presione la opcin 4.

## 2024-05-19 NOTE — Progress Notes (Signed)
 Subjective   Brittney Simpson is a 89 y.o. female who presents for the following: Lesion(s) of concern . Patient is new patient.  Today patient reports: Lesion at right lower leg, itchy & burning sensation present since the summer. Has been applying vaseline at home. Patient states it does look better today that it has. Hx of skin cancer, no family hx of skin cancer.   Daughter is with patient and contributes to history.   Review of Systems:    No other skin or systemic complaints except as noted in HPI or Assessment and Plan.  The following portions of the chart were reviewed this encounter and updated as appropriate: medications, allergies, medical history  Relevant Medical History:  Family history of skin cancer - Unknown type   Objective  Well appearing patient in no apparent distress; mood and affect are within normal limits. Examination was performed of the: Focused Exam of: Bilateral legs   Examination notable for: Actinic keratosis: Scaly erythematous macule(s) concentrated on sun exposed areas , Stasis dermatitis: Woody induration of lower legs with marked hyperpigmentation and orange discoloration. No ulcerations  Examination limited by: Undergarments, Shoes or socks , Clothing, and Patient deferred removal       Right lower leg x1 Pink scaly macules  Assessment & Plan   Stasis dermatitis - Informed the patient that this condition is caused by swelling in his legs and reducing this swelling is the ultimate treatment - Encouraged to elevate their legs for 1 hour three times a day - Prescription for compression stockings given to patient start triamcinolone  ointment 0.1% twice daily to affected areas of skin Discussed side effect of potent topical steroids including atrophy, dyspigmentation, striae, telangectasia, folliculitis, loss of skin pigment, hair growth, tachyphylaxis, risk of systemic absorption with missuse.  Level of service outlined above    Procedures, orders, diagnosis for this visit:  AK (ACTINIC KERATOSIS) Right lower leg x1 Actinic keratoses are precancerous spots that appear secondary to cumulative UV radiation exposure/sun exposure over time. They are chronic with expected duration over 1 year. A portion of actinic keratoses will progress to squamous cell carcinoma of the skin. It is not possible to reliably predict which spots will progress to skin cancer and so treatment is recommended to prevent development of skin cancer.  Recommend daily broad spectrum sunscreen SPF 30+ to sun-exposed areas, reapply every 2 hours as needed.  Recommend staying in the shade or wearing long sleeves, sun glasses (UVA+UVB protection) and wide brim hats (4-inch brim around the entire circumference of the hat). Call for new or changing lesions. Destruction of lesion - Right lower leg x1 Complexity: simple   Destruction method: cryotherapy   Informed consent: discussed and consent obtained   Timeout:  patient name, date of birth, surgical site, and procedure verified Lesion destroyed using liquid nitrogen: Yes   Region frozen until ice ball extended beyond lesion: Yes   Cryo cycles: 1 or 2. Outcome: patient tolerated procedure well with no complications   Post-procedure details: wound care instructions given   Additional details:  Prior to procedure, discussed risks of blister formation, small wound, skin dyspigmentation, or rare scar following cryotherapy. Recommend Vaseline ointment to treated areas while healing.   STASIS DERMATITIS    AK (actinic keratosis) -     Destruction of lesion  Stasis dermatitis  Other orders -     Triamcinolone  Acetonide; Apply 1 gram twice daily to affected areas of skin. Stop once resolved and restart as needed  for flares. Avoid use on face, armpits, groin unless otherwise indicated.  Dispense: 30 g; Refill: 0    Return to clinic: Return in about 3 weeks (around 06/09/2024) for w/ Dr.  Raymund.  I, Jacquelynn V. Wilfred, CMA, am acting as scribe for Lauraine JAYSON Raymund, MD .   Documentation: I have reviewed the above documentation for accuracy and completeness, and I agree with the above.  Lauraine JAYSON Raymund, MD

## 2024-05-31 ENCOUNTER — Ambulatory Visit (INDEPENDENT_AMBULATORY_CARE_PROVIDER_SITE_OTHER): Payer: Self-pay | Admitting: Urology

## 2024-05-31 VITALS — BP 156/78 | HR 66 | Ht 63.0 in | Wt 114.0 lb

## 2024-05-31 DIAGNOSIS — R339 Retention of urine, unspecified: Secondary | ICD-10-CM | POA: Diagnosis not present

## 2024-05-31 DIAGNOSIS — N3941 Urge incontinence: Secondary | ICD-10-CM | POA: Diagnosis not present

## 2024-05-31 LAB — URINALYSIS, COMPLETE
Bilirubin, UA: NEGATIVE
Glucose, UA: NEGATIVE
Ketones, UA: NEGATIVE
Leukocytes,UA: NEGATIVE
Nitrite, UA: NEGATIVE
Protein,UA: NEGATIVE
RBC, UA: NEGATIVE
Specific Gravity, UA: 1.01 (ref 1.005–1.030)
Urobilinogen, Ur: 0.2 mg/dL (ref 0.2–1.0)
pH, UA: 6 (ref 5.0–7.5)

## 2024-05-31 LAB — MICROSCOPIC EXAMINATION
Bacteria, UA: NONE SEEN
RBC, Urine: NONE SEEN /HPF (ref 0–2)

## 2024-05-31 MED ORDER — GEMTESA 75 MG PO TABS: 75.0000 mg | ORAL_TABLET | Freq: Every day | ORAL | Status: AC

## 2024-05-31 MED ORDER — TAMSULOSIN HCL 0.4 MG PO CAPS: 0.8000 mg | ORAL_CAPSULE | Freq: Every day | ORAL | 3 refills | Status: AC

## 2024-05-31 MED ORDER — GEMTESA 75 MG PO TABS: 75.0000 mg | ORAL_TABLET | Freq: Every day | ORAL | 11 refills | Status: DC

## 2024-05-31 NOTE — Progress Notes (Signed)
 05/31/2024 10:17 AM   Brittney Simpson February 26, 1928 [id number]  Referring provider: Edman Marsa PARAS, DO [address] [place],  [place] [address]  Chief Complaint  Patient presents with   Follow-up    HPI: Consulted to assess the patient for possible bladder infection and incontinence.  History was limited and patient's daughter was helpful.  Patient is hard of hearing.  It appeared she was wearing 2 depends a day in the last 2 weeks she is not leaking but has trouble to go.  She will feel an urge and then she cannot go.  She voids 4-5 times a day.  She will then double void and strain.  She says she is not getting up at night to urinate   Patient likely having obstructive voiding secondary to overactive bladder.  Her referring doctor wanted to place her on mirabegron  which I think is a good choice but apparently it was expensive.  Because of the obstructive component I decided to give her Flomax  and perform cystoscopy in the next 3 weeks and proceed accordingly.   Last culture negative Patient clinically not infected today with little bit of burning yesterday.  Urine sent for culture.  Urine looks good She did not want the cystoscopy because the Flomax  has dramatically improved her symptoms.  Flow much better and she feels empty   Today Frequency improved with Flomax .  Flow improved.  Little bit of burning today.  Flomax  renewed   Today Patient was having trouble to void and family doctor increase it to 0.8 mg of Flomax  he thought it worked better.  She has some incontinence but overall is doing well and likes the higher dose.  Flomax  60 tablets and 0.8 mg a day with 11 refills given. Watchful waiting for incontinence. She was having a lot of flow symptoms a year ago. I will see her in a year    Today Flow stable.  Incontinence worse using 3 pads a day that are quite wet.  She has bedwetting.  Clinically not infected.  Still on Flomax .  Interested in trying overactive bladder  medication.  Never tried Myrbetriq  due to cost.       PMH: Past Medical History:  Diagnosis Date   Anxiety    Arthritis    Back ache    Hiatal hernia    Hyperlipidemia    Hypertension    Hypothyroid    Osteopenia    Thyroid  disease    UTI (urinary tract infection)     Surgical History: Past Surgical History:  Procedure Laterality Date   CHOLECYSTECTOMY     HEMORRHOID SURGERY     HERNIA REPAIR     HERNIA REPAIR     KYPHOPLASTY N/A 01/23/2017   Procedure: [id number];  Surgeon: Kathlynn Sharper, MD;  Location: [facility] ORS;  Service: Orthopedics;  Laterality: N/A;   KYPHOPLASTY N/A 01/25/2020   Procedure: L4 KYPHOPLASTY;  Surgeon: Kathlynn Sharper, MD;  Location: [facility] OR[facility];  Service: Orthopedics;  Laterality: N/A;    Home Medications:  Allergies as of 05/31/2024   No Known Allergies      Medication List        Accurate as of May 31, 2024 10:17 AM. If you have any questions, ask your nurse or doctor.          acetaminophen  500 MG tablet Commonly known as: TYLENOL  Take 1,000 mg by mouth daily as needed for moderate pain or headache.   alendronate  70 MG tablet Commonly known as: FOSAMAX  Take  1 tablet (70 mg total) by mouth every 7 (seven) days. Take with a full glass of water on an empty stomach.   amLODipine  5 MG tablet Commonly known as: NORVASC  Take 1 tablet (5 mg total) by mouth daily.   AMOXICILLIN  PO Take by mouth as needed.   brimonidine 0.2 % ophthalmic solution Commonly known as: ALPHAGAN Place 1 drop into the left eye 2 (two) times daily.   clonazePAM  0.5 MG tablet Commonly known as: KLONOPIN  TAKE 1/2 TABLET BY MOUTH TWICE DAILY   Cranberry 500 MG Caps Take 1,000 mg by mouth daily with supper.   furosemide  20 MG tablet Commonly known as: LASIX  TAKE 1 and 1/2 TABLETS BY MOUTH ONCE DAILY   latanoprost 0.005 % ophthalmic solution Commonly known as: XALATAN Place 1 drop into both eyes at bedtime.   levothyroxine  112 MCG tablet Commonly  known as: SYNTHROID  TAKE 1 TABLET BY MOUTH ONCE DAILY ON AN EMPTY STOMACH. WAIT 30 MINUTES BEFORE TAKING OTHER MEDS.   lisinopril  40 MG tablet Commonly known as: ZESTRIL  Take 1 tablet (40 mg total) by mouth daily.   Melatonin 10 MG Tabs Take 10 mg by mouth at bedtime.   metoprolol  succinate 100 MG 24 hr tablet Commonly known as: TOPROL -XL Take 1 tablet (100 mg total) by mouth daily. with food   omeprazole  20 MG capsule Commonly known as: PRILOSEC TAKE 1 CAPSULE BY MOUTH ONCE DAILY BEFORE BREAKFAST   tamsulosin  0.4 MG Caps capsule Commonly known as: FLOMAX  Take 2 capsules (0.8 mg total) by mouth daily.   traMADol  50 MG tablet Commonly known as: ULTRAM  Take 1 tablet (50 mg total) by mouth 3 (three) times daily. Each refill must last 30 days.   traZODone  50 MG tablet Commonly known as: DESYREL  Take 1 tablet (50 mg total) by mouth at bedtime.   triamcinolone  ointment 0.1 % Commonly known as: KENALOG  Apply 1 gram twice daily to affected areas of skin. Stop once resolved and restart as needed for flares. Avoid use on face, armpits, groin unless otherwise indicated.   Vitamin D3 50 MCG (2000 UT) Tabs Take 2,000 Units by mouth daily with lunch.        Allergies: No Known Allergies  Family History: Family History  Problem Relation Age of Onset   Stroke Mother    Cancer Father     Social History:  reports that she has never smoked. She has never used smokeless tobacco. She reports that she does not drink alcohol and does not use drugs.  ROS:                                        Physical Exam: BP (!) 156/78   Pulse 66   Ht 5' 3 (1.6 m)   Wt 51.7 kg   BMI 20.19 kg/m   Constitutional:  Alert and oriented, No acute distress. HEENT: [place] AT, moist mucus membranes.  Trachea midline, no masses.   Laboratory Data: Lab Results  Component Value Date   WBC 4.8 04/26/2024   HGB 11.0 (L) 04/26/2024   HCT 36.9 04/26/2024   MCV 82.6 04/26/2024    PLT 207 04/26/2024    Lab Results  Component Value Date   CREATININE 0.79 04/26/2024    No results found for: PSA  No results found for: TESTOSTERONE  Lab Results  Component Value Date   HGBA1C 5.6 04/26/2024    Urinalysis  Component Value Date/Time   COLORURINE YELLOW (A) 03/20/2021 1536   APPEARANCEUR Clear 06/09/2023 0949   LABSPEC 1.011 03/20/2021 1536   LABSPEC 1.003 07/08/2014 0907   PHURINE 6.0 03/20/2021 1536   GLUCOSEU Negative 06/09/2023 0949   GLUCOSEU Negative 07/08/2014 0907   HGBUR NEGATIVE 03/20/2021 1536   BILIRUBINUR Negative 06/09/2023 0949   BILIRUBINUR Negative 07/08/2014 0907   KETONESUR NEGATIVE 03/20/2021 1536   PROTEINUR Negative 06/09/2023 0949   PROTEINUR NEGATIVE 03/20/2021 1536   NITRITE Negative 06/09/2023 0949   NITRITE NEGATIVE 03/20/2021 1536   LEUKOCYTESUR Negative 06/09/2023 0949   LEUKOCYTESUR NEGATIVE 03/20/2021 1536   LEUKOCYTESUR Negative 07/08/2014 0907    Pertinent Imaging: Urine reviewed and sent for culture  Assessment & Plan: Stay on Flomax  0.8 mg.  90 days and 3 refills sent to pharmacy.  Reassess on Gemtesa samples and prescription.  Have reasonable treatment goals.  PTNS an option  1. Incomplete bladder emptying (Primary)  - Urinalysis, Complete   No follow-ups on file.  Glendia DELENA Elizabeth, MD  [facility] [address] [place], [place] [address] [phone]

## 2024-06-02 ENCOUNTER — Other Ambulatory Visit: Payer: Self-pay | Admitting: Family Medicine

## 2024-06-02 DIAGNOSIS — E034 Atrophy of thyroid (acquired): Secondary | ICD-10-CM

## 2024-06-02 LAB — CULTURE, URINE COMPREHENSIVE

## 2024-06-03 NOTE — Telephone Encounter (Signed)
 Requested Prescriptions  Pending Prescriptions Disp Refills   levothyroxine  (SYNTHROID ) 112 MCG tablet [Pharmacy Med Name: LEVOTHYROXINE  SODIUM 112 MCG TAB] 90 tablet 3    Sig: TAKE 1 TABLET BY MOUTH ONCE DAILY ON AN EMPTY STOMACH. WAIT 30 MINUTES BEFORE TAKING OTHER MEDS.     Endocrinology:  Hypothyroid Agents Passed - 06/03/2024  4:03 PM      Passed - TSH in normal range and within 360 days    TSH  Date Value Ref Range Status  04/26/2024 2.09 0.40 - 4.50 mIU/L Final         Passed - Valid encounter within last 12 months    Recent Outpatient Visits           3 months ago Chronic pain syndrome   [place] [facility], Marsa PARAS, DO       Future Appointments             In 6 days Raymund, Lauraine BROCKS, MD [facility]   In 1 month MacDiarmid, Glendia, MD [facility]

## 2024-06-07 ENCOUNTER — Ambulatory Visit: Payer: Self-pay

## 2024-06-07 DIAGNOSIS — N3941 Urge incontinence: Secondary | ICD-10-CM

## 2024-06-08 ENCOUNTER — Telehealth: Payer: Self-pay | Admitting: Pain Medicine

## 2024-06-08 ENCOUNTER — Telehealth: Payer: Self-pay

## 2024-06-08 DIAGNOSIS — H401133 Primary open-angle glaucoma, bilateral, severe stage: Secondary | ICD-10-CM | POA: Diagnosis not present

## 2024-06-08 DIAGNOSIS — H353131 Nonexudative age-related macular degeneration, bilateral, early dry stage: Secondary | ICD-10-CM | POA: Diagnosis not present

## 2024-06-08 DIAGNOSIS — H18513 Endothelial corneal dystrophy, bilateral: Secondary | ICD-10-CM | POA: Diagnosis not present

## 2024-06-08 DIAGNOSIS — Z961 Presence of intraocular lens: Secondary | ICD-10-CM | POA: Diagnosis not present

## 2024-06-08 MED ORDER — NITROFURANTOIN MACROCRYSTAL 100 MG PO CAPS: 100.0000 mg | ORAL_CAPSULE | Freq: Two times a day (BID) | ORAL | 0 refills | Status: AC

## 2024-06-08 NOTE — Telephone Encounter (Signed)
 PT daughter stated that pt will start an antibitoic today and needs to speak with a nurse concerning her upcoming appt

## 2024-06-08 NOTE — Telephone Encounter (Signed)
 Spoke with daughter and rescheduled appointment.

## 2024-06-09 ENCOUNTER — Ambulatory Visit

## 2024-06-15 ENCOUNTER — Ambulatory Visit: Admitting: Pain Medicine

## 2024-06-15 ENCOUNTER — Ambulatory Visit

## 2024-06-16 ENCOUNTER — Ambulatory Visit
Admission: RE | Admit: 2024-06-16 | Discharge: 2024-06-16 | Disposition: A | Discharge status: Home / Self Care | Source: Ambulatory Visit | Attending: Vascular Surgery | Admitting: Vascular Surgery

## 2024-06-16 DIAGNOSIS — I701 Atherosclerosis of renal artery: Secondary | ICD-10-CM | POA: Diagnosis not present

## 2024-06-16 DIAGNOSIS — I7141 Pararenal abdominal aortic aneurysm, without rupture: Secondary | ICD-10-CM | POA: Diagnosis present

## 2024-06-16 DIAGNOSIS — I714 Abdominal aortic aneurysm, without rupture, unspecified: Secondary | ICD-10-CM | POA: Diagnosis not present

## 2024-06-16 DIAGNOSIS — K4090 Unilateral inguinal hernia, without obstruction or gangrene, not specified as recurrent: Secondary | ICD-10-CM | POA: Diagnosis not present

## 2024-06-16 DIAGNOSIS — K449 Diaphragmatic hernia without obstruction or gangrene: Secondary | ICD-10-CM | POA: Diagnosis not present

## 2024-06-16 MED ORDER — IOHEXOL 350 MG/ML SOLN
75.0000 mL | Freq: Once | INTRAVENOUS | Status: AC | PRN
  Administered 2024-06-16: 75 mL via INTRAVENOUS

## 2024-06-21 ENCOUNTER — Ambulatory Visit

## 2024-06-22 ENCOUNTER — Ambulatory Visit
Admission: RE | Admit: 2024-06-22 | Discharge: 2024-06-22 | Disposition: A | Discharge status: Home / Self Care | Source: Ambulatory Visit | Attending: Pain Medicine | Admitting: Pain Medicine

## 2024-06-22 ENCOUNTER — Encounter: Payer: Self-pay | Admitting: Pain Medicine

## 2024-06-22 ENCOUNTER — Ambulatory Visit (HOSPITAL_BASED_OUTPATIENT_CLINIC_OR_DEPARTMENT_OTHER): Admitting: Pain Medicine

## 2024-06-22 VITALS — BP 175/94 | HR 54 | Temp 97.8°F | Resp 12 | Ht 63.0 in | Wt 114.0 lb

## 2024-06-22 DIAGNOSIS — M4317 Spondylolisthesis, lumbosacral region: Secondary | ICD-10-CM | POA: Diagnosis present

## 2024-06-22 DIAGNOSIS — S22080S Wedge compression fracture of T11-T12 vertebra, sequela: Secondary | ICD-10-CM

## 2024-06-22 DIAGNOSIS — M48061 Spinal stenosis, lumbar region without neurogenic claudication: Secondary | ICD-10-CM

## 2024-06-22 DIAGNOSIS — G8929 Other chronic pain: Secondary | ICD-10-CM

## 2024-06-22 DIAGNOSIS — M5416 Radiculopathy, lumbar region: Secondary | ICD-10-CM | POA: Diagnosis present

## 2024-06-22 DIAGNOSIS — S32040S Wedge compression fracture of fourth lumbar vertebra, sequela: Secondary | ICD-10-CM

## 2024-06-22 DIAGNOSIS — Z8781 Personal history of (healed) traumatic fracture: Secondary | ICD-10-CM

## 2024-06-22 DIAGNOSIS — M5441 Lumbago with sciatica, right side: Secondary | ICD-10-CM | POA: Insufficient documentation

## 2024-06-22 DIAGNOSIS — M4310 Spondylolisthesis, site unspecified: Secondary | ICD-10-CM | POA: Diagnosis present

## 2024-06-22 DIAGNOSIS — R2689 Other abnormalities of gait and mobility: Secondary | ICD-10-CM | POA: Insufficient documentation

## 2024-06-22 DIAGNOSIS — M79604 Pain in right leg: Secondary | ICD-10-CM | POA: Diagnosis present

## 2024-06-22 MED ORDER — TRIAMCINOLONE ACETONIDE 40 MG/ML IJ SUSP
INTRAMUSCULAR | Status: AC
  Filled 2024-06-22: qty 1

## 2024-06-22 MED ORDER — IOHEXOL 180 MG/ML  SOLN
10.0000 mL | Freq: Once | INTRAMUSCULAR | Status: AC
  Administered 2024-06-22: 10 mL via EPIDURAL
  Filled 2024-06-22: qty 20

## 2024-06-22 MED ORDER — LIDOCAINE HCL 2 % IJ SOLN
20.0000 mL | Freq: Once | INTRAMUSCULAR | Status: AC
  Administered 2024-06-22: 400 mg
  Filled 2024-06-22: qty 20

## 2024-06-22 MED ORDER — ROPIVACAINE HCL 2 MG/ML IJ SOLN
2.0000 mL | Freq: Once | INTRAMUSCULAR | Status: AC
  Administered 2024-06-22: 2 mL via EPIDURAL

## 2024-06-22 MED ORDER — TRIAMCINOLONE ACETONIDE 40 MG/ML IJ SUSP
40.0000 mg | Freq: Once | INTRAMUSCULAR | Status: AC
  Administered 2024-06-22: 40 mg

## 2024-06-22 MED ORDER — SODIUM CHLORIDE (PF) 0.9 % IJ SOLN
INTRAMUSCULAR | Status: AC
  Filled 2024-06-22: qty 10

## 2024-06-22 MED ORDER — SODIUM CHLORIDE 0.9% FLUSH
2.0000 mL | Freq: Once | INTRAVENOUS | Status: AC
  Administered 2024-06-22: 2 mL

## 2024-06-22 MED ORDER — ROPIVACAINE HCL 2 MG/ML IJ SOLN
INTRAMUSCULAR | Status: AC
  Filled 2024-06-22: qty 20

## 2024-06-22 MED ORDER — PENTAFLUOROPROP-TETRAFLUOROETH EX AERO
INHALATION_SPRAY | Freq: Once | CUTANEOUS | Status: AC
  Administered 2024-06-22: 30 via TOPICAL

## 2024-06-22 NOTE — Patient Instructions (Signed)
 ______________________________________________________________________    Post-Procedure Discharge Instructions  INSTRUCTIONS Apply ice:  Purpose: This will minimize any swelling and discomfort after procedure.  When: Day of procedure, as soon as you get home. How: Fill a plastic sandwich bag with crushed ice. Cover it with a small towel and apply to injection site. How long: (15 min on, 15 min off) Apply for 15 minutes then remove x 15 minutes.  Repeat sequence on day of procedure, until you go to bed. Apply heat:  Purpose: To treat any soreness and discomfort from the procedure. When: Starting the next day after the procedure. How: Apply heat to procedure site starting the day following the procedure. How long: May continue to repeat daily, until discomfort goes away. Food intake: Start with clear liquids (like water) and advance to regular food, as tolerated.  Physical activities: Keep activities to a minimum for the first 8 hours after the procedure. After that, then as tolerated. Driving: If you have received any sedation, be responsible and do not drive. You are not allowed to drive for 24 hours after having sedation. Blood thinner: (Applies only to those taking blood thinners) You may restart your blood thinner 6 hours after your procedure. Insulin: (Applies only to Diabetic patients taking insulin) As soon as you can eat, you may resume your normal dosing schedule. Infection prevention: Keep procedure site clean and dry. Shower daily and clean area with soap and water.  PAIN DIARY Post-procedure Pain Diary: Extremely important that this be done correctly and accurately. Recorded information will be used to determine the next step in treatment. For the purpose of accuracy, follow these rules: Evaluate only the area treated. Do not report or include pain from an untreated area. For the purpose of this evaluation, ignore all other areas of pain, except for the treated area. After your  procedure, avoid taking a long nap and attempting to complete the pain diary after you wake up. Instead, set your alarm clock to go off every hour, on the hour, for the initial 8 hours after the procedure. Document the duration of the numbing medicine, and the relief you are getting from it. Do not go to sleep and attempt to complete it later. It will not be accurate. If you received sedation, it is likely that you were given a medication that may cause amnesia. Because of this, completing the diary at a later time may cause the information to be inaccurate. This information is needed to plan your care. Follow-up appointment: Keep your post-procedure follow-up evaluation appointment after the procedure (usually 2 weeks for most procedures, 6 weeks for radiofrequencies). DO NOT FORGET to bring you pain diary with you.   EXPECT... (What should I expect to see with my procedure?) From numbing medicine (AKA: Local Anesthetics): Numbness or decrease in pain. You may also experience some weakness, which if present, could last for the duration of the local anesthetic. Onset: Full effect within 15 minutes of injected. Duration: It will depend on the type of local anesthetic used. On the average, 1 to 8 hours.  From steroids (Applies only if steroids were used): Decrease in swelling or inflammation. Once inflammation is improved, relief of the pain will follow. Onset of benefits: Depends on the amount of swelling present. The more swelling, the longer it will take for the benefits to be seen. In some cases, up to 10 days. Duration: Steroids will stay in the system x 2 weeks. Duration of benefits will depend on multiple posibilities including persistent irritating  factors. Side-effects: If present, they may typically last 2 weeks (the duration of the steroids). Frequent: Cramps (if they occur, drink Gatorade and take over-the-counter Magnesium 450-500 mg once to twice a day); water retention with temporary weight  gain; increases in blood sugar; decreased immune system response; increased appetite. Occasional: Facial flushing (red, warm cheeks); mood swings; menstrual changes. Uncommon: Long-term decrease or suppression of natural hormones; bone thinning. (These are more common with higher doses or more frequent use. This is why we prefer that our patients avoid having any injection therapies in other practices.)  Very Rare: Severe mood changes; psychosis; aseptic necrosis. From procedure: Some discomfort is to be expected once the numbing medicine wears off. This should be minimal if ice and heat are applied as instructed.  CALL IF... (When should I call?) You experience numbness and weakness that gets worse with time, as opposed to wearing off. New onset bowel or bladder incontinence. (Applies only to procedures done in the spine)  Emergency Numbers: Durning business hours (Monday - Thursday, 8:00 AM - 4:00 PM) (Friday, 9:00 AM - 12:00 Noon): [phone] After hours: [phone] NOTE: If you are having a problem and are unable connect with, or to talk to a provider, then go to your nearest urgent care or emergency department. If the problem is serious and urgent, please call 911. ______________________________________________________________________     ______________________________________________________________________    Steroid injections  Common steroids for injections Triamcinolone : Used by many sports medicine physicians for large joint and bursal injections, often combined with a local anesthetic like lidocaine . A study focusing on coccydynia (tailbone pain) found triamcinolone  was more effective than betamethasone, suggesting it may also be preferable for other localized inflammation conditions. Methylprednisolone: A common alternative to triamcinolone  that is also a strong anti-inflammatory. It is available in different formulations, with the acetate suspension being the long-acting  option for intra-articular injections. Dexamethasone : This is a non-particulate steroid, meaning it has a lower risk of tissue damage compared to particulate steroids like triamcinolone  and methylprednisolone. While less common for this specific use, it is an option for targeted injections.   Considerations for physicians Particulate vs. non-particulate steroids: Triamcinolone  and methylprednisolone are particulate, meaning they can clump together. Dexamethasone  is non-particulate. Particulate steroids are often preferred for their longer-lasting effects but carry a theoretical higher risk for certain injections (though this is less of a concern in the costochondral joints). Combined injectate: Corticosteroids are typically mixed with a local anesthetic like lidocaine  to provide both immediate pain relief (from the anesthetic) and longer-term inflammation reduction (from the steroid). Imaging guidance: To ensure accurate placement of the needle and medication, physicians may use ultrasound or fluoroscopic guidance for the injection, especially in complex or refractory cases.   Patient guidance Before undergoing a steroid injection, discuss the options with your physician. They will determine the best steroid, dosage, and procedure for your specific case based on factors like: Severity of your condition History of response to other treatments Your overall health status Experience and preference of the physician  Last  Updated: 03/23/2024 ______________________________________________________________________

## 2024-06-22 NOTE — Progress Notes (Signed)
 PROVIDER NOTE: Interpretation of information contained herein should be left to medically-trained personnel. Specific patient instructions are provided elsewhere under Patient Instructions section of medical record. This document was created in part using STT-dictation technology, any transcriptional errors that may result from this process are unintentional.  Patient: Brittney Simpson Type: Established DOB: 1928-05-20 MRN: [id number] PCP: Edman Marsa PARAS, DO  Service: Procedure DOS: 06/22/2024 Setting: Ambulatory Location: Ambulatory outpatient facility Delivery: Face-to-face Provider: Eric DELENA Como, MD Specialty: Interventional Pain Management Specialty designation: 09 Location: Outpatient facility Ref. Prov.: Edman Marsa *       Interventional Therapy   Type: Lumbar epidural steroid injection (LESI) (interlaminar) No.:15    Laterality: Right   Level:  L1-2 Level.  Imaging: Fluoroscopic guidance Spinal ([id number]) Anesthesia: Local anesthesia (1-2% Lidocaine ) Anxiolysis: None                            Sedation: No Sedation                       DOS: 06/22/2024  Performed by: Eric DELENA Como, MD  Purpose: Diagnostic/Therapeutic Indications: Lumbar radicular pain of intraspinal etiology of more than 4 weeks that has failed to respond to conservative therapy and is severe enough to impact quality of life or function. 1. Chronic low back pain (Right) w/ radicular pain (Right)   2. Chronic lower extremity pain (3ry area of Pain) (Right)   3. Chronic lumbar radicular pain (Right)   4. Compression fracture of L4 lumbar vertebra, sequela   5. Grade 1 Retrolisthesis of L2/L3 & L3/L4   6. Grade 2 (12 mm) Anterolisthesis of lumbosacral spine (L5/S1)   7. History of vertebral compression fracture (T7, T12, L4)   8. L5 Pars defect with spondylolisthesis (Bilateral)   9. Spinal stenosis of lumbar region, unspecified whether neurogenic claudication present    10. Spondylolisthesis of lumbosacral region (L2-3 and L5-S1)   11. T12 compression fracture, sequela   12. T12-L1 endplate Retropulsion    NAS-11 Pain score:   Pre-procedure: 9 /10   Post-procedure: 9 /10      Position / Prep / Materials:  Position: Prone w/ head of the table raised (slight reverse trendelenburg) to facilitate breathing.  Prep solution: ChloraPrep (2% chlorhexidine  gluconate and 70% isopropyl alcohol) Prep Area: Entire Posterior Lumbar Region from lower scapular tip down to mid buttocks area and from flank to flank. Materials:  Tray: Epidural tray Needle(s):  Type: Epidural needle (Tuohy) Gauge (G):  17 Length: Regular (3.5-in) Qty: 1  H&P (Pre-op Assessment):  Brittney Simpson is a 89 y.o. (year old), female patient, seen today for interventional treatment. She  has a past surgical history that includes Hernia repair; Hernia repair; Hemorrhoid surgery; Kyphoplasty (N/A, 01/23/2017); Kyphoplasty (N/A, 01/25/2020); and Cholecystectomy. Brittney Simpson has a current medication list which includes the following prescription(s): acetaminophen , alendronate , amlodipine , brimonidine, vitamin d3, clonazepam , cranberry, furosemide , latanoprost, levothyroxine , lisinopril , melatonin, metoprolol  succinate, omeprazole , tamsulosin , tramadol , trazodone , triamcinolone  ointment, gemtesa , gemtesa , gemtesa , and amoxicillin . Her primarily concern today is the Back Pain (lower)  Initial Vital Signs:  Pulse/HCG Rate: (!) 54ECG Heart Rate: (!) 57 Temp: 97.8 F (36.6 C) Resp: 16 BP: (!) 154/71 SpO2: 97 %  BMI: Estimated body mass index is 20.19 kg/m as calculated from the following:   Height as of this encounter: 5' 3 (1.6 m).   Weight as of this encounter: 114 lb (51.7 kg).  Risk Assessment: Allergies: Reviewed.  She has no known allergies.  Allergy Precautions: None required Coagulopathies: Reviewed. None identified.  Blood-thinner therapy: None at this time Active Infection(s):  Reviewed. None identified. Ms. Kiger is afebrile  Site Confirmation: Brittney Simpson was asked to confirm the procedure and laterality before marking the site Procedure checklist: Completed Consent: Before the procedure and under the influence of no sedative(s), amnesic(s), or anxiolytics, the patient was informed of the treatment options, risks and possible complications. To fulfill our ethical and legal obligations, as recommended by the American Medical Association's Code of Ethics, I have informed the patient of my clinical impression; the nature and purpose of the treatment or procedure; the risks, benefits, and possible complications of the intervention; the alternatives, including doing nothing; the risk(s) and benefit(s) of the alternative treatment(s) or procedure(s); and the risk(s) and benefit(s) of doing nothing. The patient was provided information about the general risks and possible complications associated with the procedure. These may include, but are not limited to: failure to achieve desired goals, infection, bleeding, organ or nerve damage, allergic reactions, paralysis, and death. In addition, the patient was informed of those risks and complications associated to Spine-related procedures, such as failure to decrease pain; infection (i.e.: Meningitis, epidural or intraspinal abscess); bleeding (i.e.: epidural hematoma, subarachnoid hemorrhage, or any other type of intraspinal or peri-dural bleeding); organ or nerve damage (i.e.: Any type of peripheral nerve, nerve root, or spinal cord injury) with subsequent damage to sensory, motor, and/or autonomic systems, resulting in permanent pain, numbness, and/or weakness of one or several areas of the body; allergic reactions; (i.e.: anaphylactic reaction); and/or death. Furthermore, the patient was informed of those risks and complications associated with the medications. These include, but are not limited to: allergic reactions (i.e.:  anaphylactic or anaphylactoid reaction(s)); adrenal axis suppression; blood sugar elevation that in diabetics may result in ketoacidosis or comma; water retention that in patients with history of congestive heart failure may result in shortness of breath, pulmonary edema, and decompensation with resultant heart failure; weight gain; swelling or edema; medication-induced neural toxicity; particulate matter embolism and blood vessel occlusion with resultant organ, and/or nervous system infarction; and/or aseptic necrosis of one or more joints. Finally, the patient was informed that Medicine is not an exact science; therefore, there is also the possibility of unforeseen or unpredictable risks and/or possible complications that may result in a catastrophic outcome. The patient indicated having understood very clearly. We have given the patient no guarantees and we have made no promises. Enough time was given to the patient to ask questions, all of which were answered to the patient's satisfaction. Ms. Moffitt has indicated that she wanted to continue with the procedure. Attestation: I, the ordering provider, attest that I have discussed with the patient the benefits, risks, side-effects, alternatives, likelihood of achieving goals, and potential problems during recovery for the procedure that I have provided informed consent. Date  Time: 06/22/2024  8:03 AM  Pre-Procedure Preparation:  Monitoring: As per clinic protocol. Respiration, ETCO2, SpO2, BP, heart rate and rhythm monitor placed and checked for adequate function Safety Precautions: Patient was assessed for positional comfort and pressure points before starting the procedure. Time-out: I initiated and conducted the Time-out before starting the procedure, as per protocol. The patient was asked to participate by confirming the accuracy of the Time Out information. Verification of the correct person, site, and procedure were performed and confirmed by  me, the nursing staff, and the patient. Time-out conducted as per Joint Commission's Universal Protocol (UP.01.01.01). Time: [phone]  Start Time: 0822 hrs.  Description/Narrative of Procedure:          Target: Epidural space via interlaminar opening, initially targeting the lower laminar border of the superior vertebral body. Region: Lumbar Approach: Percutaneous paravertebral  Rationale (medical necessity): procedure needed and proper for the diagnosis and/or treatment of the patient's medical symptoms and needs. Procedural Technique Safety Precautions: Aspiration looking for blood return was conducted prior to all injections. At no point did we inject any substances, as a needle was being advanced. No attempts were made at seeking any paresthesias. Safe injection practices and needle disposal techniques used. Medications properly checked for expiration dates. SDV (single dose vial) medications used. Description of the Procedure: Protocol guidelines were followed. The procedure needle was introduced through the skin, ipsilateral to the reported pain, and advanced to the target area. Bone was contacted and the needle walked caudad, until the lamina was cleared. The epidural space was identified using "loss-of-resistance technique" with 2-3 ml of PF-NaCl (0.9% NSS), in a 5cc LOR glass syringe.  Vitals:   06/22/24 0820 06/22/24 0825 06/22/24 0830 06/22/24 0833  BP: (!) 183/86 (!) 167/85 (!) 170/87 (!) 175/94  Pulse:      Resp: 13 14 15 12   Temp:      TempSrc:      SpO2: 98% 98% 97% 99%  Weight:      Height:        Start Time: 0822 hrs. End Time: 0832 hrs.  Imaging Guidance (Spinal):          Type of Imaging Technique: Fluoroscopy Guidance (Spinal) Indication(s): Fluoroscopy guidance for needle placement to enhance accuracy in procedures requiring precise needle localization for targeted delivery of medication in or near specific anatomical locations not easily accessible without such  real-time imaging assistance. Exposure Time: Please see nurses notes. Contrast: Before injecting any contrast, we confirmed that the patient did not have an allergy to iodine, shellfish, or radiological contrast. Once satisfactory needle placement was completed at the desired level, radiological contrast was injected. Contrast injected under live fluoroscopy. No contrast complications. See chart for type and volume of contrast used. Fluoroscopic Guidance: I was personally present during the use of fluoroscopy. Tunnel Vision Technique used to obtain the best possible view of the target area. Parallax error corrected before commencing the procedure. Direction-depth-direction technique used to introduce the needle under continuous pulsed fluoroscopy. Once target was reached, antero-posterior, oblique, and lateral fluoroscopic projection used confirm needle placement in all planes. Images permanently stored in EMR. Interpretation: I personally interpreted the imaging intraoperatively. Adequate needle placement confirmed in multiple planes. Appropriate spread of contrast into desired area was observed. No evidence of afferent or efferent intravascular uptake. No intrathecal or subarachnoid spread observed. Permanent images saved into the patient's record.  Antibiotic Prophylaxis:   Anti-infectives (From admission, onward)    None      Indication(s): None identified  Post-operative Assessment:  Post-procedure Vital Signs:  Pulse/HCG Rate: (!) 5461 Temp: 97.8 F (36.6 C) Resp: 12 BP: (!) 175/94 SpO2: 99 %  EBL: None  Complications: No immediate post-treatment complications observed by team, or reported by patient.  Note: The patient tolerated the entire procedure well. A repeat set of vitals were taken after the procedure and the patient was kept under observation following institutional policy, for this type of procedure. Post-procedural neurological assessment was performed, showing  return to baseline, prior to discharge. The patient was provided with post-procedure discharge instructions, including a section on how to identify potential problems.  Should any problems arise concerning this procedure, the patient was given instructions to immediately contact us , at any time, without hesitation. In any case, we plan to contact the patient by telephone for a follow-up status report regarding this interventional procedure.  Comments:  No additional relevant information.  Plan of Care (POC)  Orders:  Orders Placed This Encounter  Procedures   Lumbar Epidural Injection    Scheduling Instructions:     Procedure: Interlaminar LESI TBD     Laterality: TBD     Sedation: No Sedation     Date: 06/22/2024    Where will this procedure be performed?:   [facility]   [facility] C-ARM 1-60 MIN NO REPORT    Intraoperative interpretation by procedural physician at [facility].    Standing Status:   Standing    Number of Occurrences:   1    Reason for exam::   Assistance in needle guidance and placement for procedures requiring needle placement in or near specific anatomical locations not easily accessible without such assistance.   Informed Consent Details: Physician/Practitioner Attestation; Transcribe to consent form and obtain patient signature    Note: Always confirm laterality of pain with Ms. Giese, before procedure. Transcribe to consent form and obtain patient signature.    Physician/Practitioner attestation of informed consent for procedure/surgical case:   I, the physician/practitioner, attest that I have discussed with the patient the benefits, risks, side effects, alternatives, likelihood of achieving goals and potential problems during recovery for the procedure that I have provided informed consent.    Procedure:   Lumbar epidural steroid injection under fluoroscopic guidance    Physician/Practitioner performing the procedure:   Deston Bilyeu A.  Tanya, MD    Indication/Reason:   Low back and/or lower extremity pain secondary to lumbar radiculitis   Provide equipment / supplies at bedside    Procedural tray: Epidural Tray (Disposable  single use) Skin infiltration needle: Regular 1.5-in, 25-G, (x1) Block needle size: Regular standard Catheter: No catheter required    Standing Status:   Standing    Number of Occurrences:   1    Specify:   Epidural Tray   Saline lock IV    Have LR [id number] mL available and administer at 125 mL/hr if patient becomes hypotensive.    Standing Status:   Standing    Number of Occurrences:   1     Opioid Analgesic: Tramadol  50 mg, 1 tab PO q 8 hrs (150 mg/day of tramadol ) MME/day: 15 mg/day.    Medications ordered for procedure: Meds ordered this encounter  Medications   iohexol  (OMNIPAQUE ) 180 MG/ML injection 10 mL    Must be Myelogram-compatible. If not available, you may substitute with a water-soluble, non-ionic, hypoallergenic, myelogram-compatible radiological contrast medium.   lidocaine  (XYLOCAINE ) 2 % (with pres) injection 400 mg   pentafluoroprop-tetrafluoroeth (GEBAUERS) aerosol   sodium chloride  flush (NS) 0.9 % injection 2 mL   ropivacaine  (PF) 2 mg/mL (0.2%) (NAROPIN ) injection 2 mL   triamcinolone  acetonide (KENALOG -40) injection 40 mg   Medications administered: We administered iohexol , lidocaine , pentafluoroprop-tetrafluoroeth, sodium chloride  flush, ropivacaine  (PF) 2 mg/mL (0.2%), and triamcinolone  acetonide.  See the medical record for exact dosing, route, and time of administration.    Interventional Therapies  Risk  Complexity Considerations:   Advanced age  HOH  AAA  HTN  Stage3 CKD  GERD   Note: Hard of hearing   Planned  Pending:   Therapeutic ML-right L5-S1 LESI #1  Under consideration:   (PRN) palliative right L1-2 LESI #14    Completed:   Palliative right L1-2 LESI x13 (02/17/2024) (previous: 100/100/75/75)  Therapeutic right L2-3 LESI x1  (03/07/2020) (100/100/25/<50)  Diagnostic/therapeutic bilateral SI joint injection x1 (02/10/2020) (100/100/100 x2 days/0)  Palliative right lumbar facet MBB x13 (01/04/2020) (8 to 0) (100/100/100)  Palliative left lumbar facet MBB x11 (01/04/2020) (8 to 0) (100/100/100)    Therapeutic  Palliative (PRN) options:   Palliative right T12-L1 LESI (PRN)   Pharmacotherapy  Nonopioids transferred 04/11/2020: Robaxin        Follow-up plan:   Return in about 2 weeks (around 07/06/2024) for (Face2F), (PPE).     Recent Visits Date Type Provider Dept  03/31/24 Office Visit Tanya Glisson, MD [facility]  Showing recent visits within past 90 days and meeting all other requirements Today's Visits Date Type Provider Dept  06/22/24 Procedure visit Tanya Glisson, MD [facility]  Showing today's visits and meeting all other requirements Future Appointments Date Type Provider Dept  07/12/24 Appointment Tanya Glisson, MD [facility]  Showing future appointments within next 90 days and meeting all other requirements   Disposition: Discharge home  Discharge (Date  Time): 06/22/2024; 0835 hrs.   Primary Care Physician: Edman Marsa PARAS, DO Location: [facility] Note by: Glisson DELENA Tanya, MD (TTS technology used. I apologize for any typographical errors that were not detected and corrected.) Date: 06/22/2024; Time: 9:19 AM  Disclaimer:  Medicine is not an visual merchandiser. The only guarantee in medicine is that nothing is guaranteed. It is important to note that the decision to proceed with this intervention was based on the information collected from the patient. The Data and conclusions were drawn from the patient's questionnaire, the interview, and the physical examination. Because the information was provided in large part by the patient, it cannot be guaranteed that it has not been purposely or unconsciously manipulated.  Every effort has been made to obtain as much relevant data as possible for this evaluation. It is important to note that the conclusions that lead to this procedure are derived in large part from the available data. Always take into account that the treatment will also be dependent on availability of resources and existing treatment guidelines, considered by other Pain Management Practitioners as being common knowledge and practice, at the time of the intervention. For Medico-Legal purposes, it is also important to point out that variation in procedural techniques and pharmacological choices are the acceptable norm. The indications, contraindications, technique, and results of the above procedure should only be interpreted and judged by a Board-Certified Interventional Pain Specialist with extensive familiarity and expertise in the same exact procedure and technique.

## 2024-06-23 ENCOUNTER — Telehealth: Payer: Self-pay | Admitting: *Deleted

## 2024-06-23 NOTE — Telephone Encounter (Signed)
 Post procedure call; spoke with daughter, no questions or concerns.

## 2024-06-29 ENCOUNTER — Telehealth: Payer: Self-pay

## 2024-06-29 DIAGNOSIS — N3941 Urge incontinence: Secondary | ICD-10-CM

## 2024-06-29 MED ORDER — MIRABEGRON ER 50 MG PO TB24: 50.0000 mg | ORAL_TABLET | Freq: Every day | ORAL | 11 refills | Status: DC

## 2024-06-29 NOTE — Telephone Encounter (Signed)
 Patients daughter called and after filing the Gemtesa  through insurance unfortunately it is to expensive for her fixed income. They are requesting a alternative to be called in please. Patient is out of meds in the next two days. They use [facility] in [place]. They were hoping to have new med before Thursday.

## 2024-06-29 NOTE — Telephone Encounter (Signed)
 Sent in new prescription of Myrbetriq  50mg  30 X 11 to her pharmacy

## 2024-06-29 NOTE — Telephone Encounter (Signed)
 After speaking with pharmacy. It looks like her insurance covers name brand only but at her co-pay of $300 and this is out of the patients budget. Gemtesa  and Myrbetriq  are under that co-pay. The pharmacy suggested Oxybutin that is affordable for her but with patients advanced age I wanted to hold and see the providers recommendation going forward.

## 2024-06-30 NOTE — Telephone Encounter (Signed)
 Patient's daughter called again stating that she spoke with the pharmacy regarding patients medication. She stated that another pharmacist recommended Detrol LA. Daughter would like to know if this is something that can be considered as well. Patient will be out of her medication on Friday. Please advise.

## 2024-06-30 NOTE — Telephone Encounter (Signed)
 Patient's daughter called back and left message requesting a call back by end of day regarding medication. Please advise.

## 2024-07-01 MED ORDER — TROSPIUM CHLORIDE ER 60 MG PO CP24: 60.0000 mg | ORAL_CAPSULE | Freq: Every day | ORAL | 11 refills | Status: DC

## 2024-07-01 NOTE — Telephone Encounter (Signed)
 Patient's daughter called regarding a possible medication change from Myrbetriq  to Detrol due to insurance coverage. Per Sam, trospium would be a more suitable option given the patient's age. I informed the patient's daughter of this recommendation and that I would confirm with the pharmacy. I also spoke with Alvaro at [facility] [employer], who confirmed that trospium is covered by the patient's insurance.

## 2024-07-02 ENCOUNTER — Ambulatory Visit (INDEPENDENT_AMBULATORY_CARE_PROVIDER_SITE_OTHER): Admitting: Vascular Surgery

## 2024-07-06 ENCOUNTER — Ambulatory Visit (INDEPENDENT_AMBULATORY_CARE_PROVIDER_SITE_OTHER): Admitting: Vascular Surgery

## 2024-07-06 ENCOUNTER — Other Ambulatory Visit: Payer: Self-pay | Admitting: Family Medicine

## 2024-07-06 DIAGNOSIS — F419 Anxiety disorder, unspecified: Secondary | ICD-10-CM

## 2024-07-08 NOTE — Telephone Encounter (Signed)
 Requested medication (s) are due for refill today: yes  Requested medication (s) are on the active medication list: yes  Last refill:  01/21/24  Future visit scheduled: yes  Notes to clinic:  Unable to refill per protocol, cannot delegate.      Requested Prescriptions  Pending Prescriptions Disp Refills   clonazePAM  (KLONOPIN ) 0.5 MG tablet [Pharmacy Med Name: CLONAZEPAM  0.5 MG TAB] 30 tablet     Sig: TAKE 1/2 TABLET BY MOUTH TWICE DAILY     Not Delegated - Psychiatry: Anxiolytics/Hypnotics 2 Failed - 07/08/2024 11:47 AM      Failed - This refill cannot be delegated      Failed - Urine Drug Screen completed in last 360 days      Passed - Patient is not pregnant      Passed - Valid encounter within last 6 months    Recent Outpatient Visits           4 months ago Chronic pain syndrome   [facility] [place], Marsa PARAS, DO       Future Appointments             In 2 weeks MacDiarmid, Glendia, MD [facility]

## 2024-07-09 ENCOUNTER — Ambulatory Visit (INDEPENDENT_AMBULATORY_CARE_PROVIDER_SITE_OTHER): Admitting: Vascular Surgery

## 2024-07-11 NOTE — Progress Notes (Unsigned)
 PROVIDER NOTE: Interpretation of information contained herein should be left to medically-trained personnel. Specific patient instructions are provided elsewhere under Patient Instructions section of medical record. This document was created in part using AI and STT-dictation technology, any transcriptional errors that may result from this process are unintentional.  Patient: Brittney Simpson  Service: E/M   PCP: Edman Marsa PARAS, DO  DOB: [birth year]  DOS: 07/12/2024  Provider: Eric DELENA Como, MD  MRN: [id number]  Delivery: Face-to-face  Specialty: Interventional Pain Management  Type: Established Patient  Setting: Ambulatory outpatient facility  Specialty designation: 09  Referring Prov.: Edman Marsa PARAS, DO  Location: Outpatient office facility       History of present illness (HPI) Brittney Simpson, a 89 y.o. year old female, is here today because of her Chronic right-sided low back pain with right-sided sciatica [M54.41, G89.29]. Brittney Simpson primary complain today is No chief complaint on file.  Pertinent problems: Brittney Simpson has Lumbar facet syndrome (Bilateral) (R>L); Chronic low back pain (1ry area of Pain) (Bilateral) (R>L) w/o sciatica; Spondylolisthesis of lumbosacral region (L2-3 and L5-S1); Lumbar spinal stenosis (9 mm at L3-4); Trochanteric bursitis (Right); Chronic hip pain (2ry area of Pain) (Right); Osteoarthritis of hip (Right); Chronic sacroiliac joint pain (Bilateral) (R>L); Chronic lumbar radicular pain (Right); Chronic lower extremity pain (3ry area of Pain) (Right); Myofascial pain; Spondylosis without myelopathy or radiculopathy, lumbosacral region; Ankle edema, bilateral; Abnormal MRI, lumbar spine (02/02/2020); T12 compression fracture, sequela; Lumbar facet hypertrophy; Lumbar lateral recess stenosis; Lumbar foraminal stenosis; Non-traumatic compression fracture of T7 thoracic vertebra, sequela; Chronic pain syndrome; DDD (degenerative disc  disease), lumbosacral; Edema of lower extremity; Acute exacerbation of chronic low back pain; Somatic dysfunction of sacroiliac joints (Bilateral); Other spondylosis, sacral and sacrococcygeal region; Spasm of back muscles; DDD (degenerative disc disease), thoracolumbar; Chronic low back pain (Right) w/ radicular pain (Right); Chronic headache disorder; Grade 2 (12 mm) Anterolisthesis of lumbosacral spine (L5/S1); Grade 1 Retrolisthesis of L2/L3 & L3/L4; Compression fracture of L4 lumbar vertebra, sequela; T12-L1 endplate Retropulsion; L5 Pars defect with spondylolisthesis (Bilateral); History of kyphoplasty (L4, T12, T7); Wedge compression fracture of T7-t8 vertebra, sequela; Chronic midline thoracic back pain; and History of vertebral compression fracture (T7, T12, L4) on their pertinent problem list.  Pain Assessment: Severity of   is reported as a  /10. Location:    / . Onset:  . Quality:  . Timing:  . Modifying factor(s):  SABRA Vitals:  vitals were not taken for this visit.  BMI: Estimated body mass index is 20.19 kg/m as calculated from the following:   Height as of 06/22/24: 5' 3 (1.6 m).   Weight as of 06/22/24: 114 lb (51.7 kg).  Last encounter: 06/22/2024. Last procedure: 06/22/2024.  Reason for encounter: post-procedure evaluation and assessment.   Discussed the use of AI scribe software for clinical note transcription with the patient, who gave verbal consent to proceed.  History of Present Illness          Post-Procedure Evaluation   Type: Lumbar epidural steroid injection (LESI) (interlaminar) No.:15    Laterality: Right   Level:  L1-2 Level.  Imaging: Fluoroscopic guidance Spinal ([id number]) Anesthesia: Local anesthesia (1-2% Lidocaine ) Anxiolysis: None                            Sedation: No Sedation  DOS: 06/22/2024  Performed by: Eric DELENA Como, MD  Purpose: Diagnostic/Therapeutic Indications: Lumbar radicular pain of intraspinal etiology of  more than 4 weeks that has failed to respond to conservative therapy and is severe enough to impact quality of life or function. 1. Chronic low back pain (Right) w/ radicular pain (Right)   2. Chronic lower extremity pain (3ry area of Pain) (Right)   3. Chronic lumbar radicular pain (Right)   4. Compression fracture of L4 lumbar vertebra, sequela   5. Grade 1 Retrolisthesis of L2/L3 & L3/L4   6. Grade 2 (12 mm) Anterolisthesis of lumbosacral spine (L5/S1)   7. History of vertebral compression fracture (T7, T12, L4)   8. L5 Pars defect with spondylolisthesis (Bilateral)   9. Spinal stenosis of lumbar region, unspecified whether neurogenic claudication present   10. Spondylolisthesis of lumbosacral region (L2-3 and L5-S1)   11. T12 compression fracture, sequela   12. T12-L1 endplate Retropulsion    NAS-11 Pain score:   Pre-procedure: 9 /10   Post-procedure: 9 /10     Effectiveness:  Initial hour after procedure:   ***. Subsequent 4-6 hours post-procedure:   ***. Analgesia past initial 6 hours:   ***. Ongoing improvement:  Analgesic:  *** Function:    ***    ROM:    ***    Interpretation: ***  Pharmacotherapy Assessment   Opioid Analgesic: Tramadol  50 mg, 1 tab PO q 8 hrs (150 mg/day of tramadol ) MME/day: 15 mg/day.   Monitoring: [place] PMP: PDMP not reviewed this encounter.       Pharmacotherapy: No side-effects or adverse reactions reported. Compliance: No problems identified. Effectiveness: Clinically acceptable.  No notes on file  UDS:  Summary  Date Value Ref Range Status  04/07/2023 Note  Final    Comment:    ==================================================================== [employer] Select 13 (MW) ==================================================================== Test                             Result       Flag       Units  Drug Present and Declared for Prescription Verification   7-aminoclonazepam              255          EXPECTED   ng/mg creat     7-aminoclonazepam is an expected metabolite of clonazepam . Source of    clonazepam  is a scheduled prescription medication.    Tramadol                        >25000       EXPECTED   ng/mg creat   O-Desmethyltramadol            >25000       EXPECTED   ng/mg creat   N-Desmethyltramadol            7320         EXPECTED   ng/mg creat    Source of tramadol  is a prescription medication. O-desmethyltramadol    and N-desmethyltramadol are expected metabolites of tramadol .  ==================================================================== Test                      Result    Flag   Units      Ref Range   Creatinine              20  mg/dL      >=79 ==================================================================== Declared Medications:  The flagging and interpretation on this report are based on the  following declared medications.  Unexpected results may arise from  inaccuracies in the declared medications.   **Note: The testing scope of this panel includes these medications:   Clonazepam  (Klonopin )  Tramadol  (Ultram )   **Note: The testing scope of this panel does not include the  following reported medications:   Acetaminophen  (Tylenol )  Amlodipine  (Norvasc )  Cranberry  Eye Drops  Furosemide  (Lasix )  Levothyroxine  (Synthroid )  Lisinopril  (Zestril )  Melatonin  Methocarbamol  (Robaxin )  Metoprolol  (Toprol )  Naloxone  (Narcan )  Omeprazole  (Prilosec)  Tamsulosin  (Flomax )  Trazodone  (Desyrel )  Vitamin D3 ==================================================================== For clinical consultation, please call [phone]. ====================================================================     No results found for: CBDTHCR No results found for: D8THCCBX No results found for: D9THCCBX  ROS  Constitutional: Denies any fever or chills Gastrointestinal: No reported hemesis, hematochezia, vomiting, or acute GI distress Musculoskeletal: Denies any acute  onset joint swelling, redness, loss of ROM, or weakness Neurological: No reported episodes of acute onset apraxia, aphasia, dysarthria, agnosia, amnesia, paralysis, loss of coordination, or loss of consciousness  Medication Review  Amoxicillin , Cranberry, Melatonin, Trospium  Chloride, Vitamin D3, acetaminophen , alendronate , amLODipine , brimonidine, clonazePAM , furosemide , latanoprost, levothyroxine , lisinopril , metoprolol  succinate, omeprazole , tamsulosin , traMADol , traZODone , and triamcinolone  ointment  History Review  Allergy: Brittney Simpson has no known allergies. Drug: Brittney Simpson  reports no history of drug use. Alcohol:  reports no history of alcohol use. Tobacco:  reports that she has never smoked. She has never used smokeless tobacco. Social: Brittney Simpson  reports that she has never smoked. She has never used smokeless tobacco. She reports that she does not drink alcohol and does not use drugs. Medical:  has a past medical history of Anxiety, Arthritis, Back ache, Hiatal hernia, Hyperlipidemia, Hypertension, Hypothyroid, Osteopenia, Thyroid  disease, and UTI (urinary tract infection). Surgical: Brittney Simpson  has a past surgical history that includes Hernia repair; Hernia repair; Hemorrhoid surgery; Kyphoplasty (N/A, 01/23/2017); Kyphoplasty (N/A, 01/25/2020); and Cholecystectomy. Family: family history includes Cancer in her father; Stroke in her mother.  Laboratory Chemistry Profile   Renal Lab Results  Component Value Date   BUN 11 04/26/2024   CREATININE 0.79 04/26/2024   BCR SEE NOTE: 04/26/2024   GFRAA 76 05/16/2020   GFRNONAA 66 05/16/2020    Hepatic Lab Results  Component Value Date   AST 9 (L) 04/26/2024   ALT 7 04/26/2024   ALBUMIN 3.6 11/22/2016   ALKPHOS 59 11/22/2016   LIPASE 21 06/14/2016    Electrolytes Lab Results  Component Value Date   NA 136 04/26/2024   K 4.5 04/26/2024   CL 100 04/26/2024   CALCIUM 9.1 04/26/2024   MG 2.1 01/14/2014    Bone No  results found for: VD25OH, VD125OH2TOT, CI6874NY7, CI7874NY7, 25OHVITD1, 25OHVITD2, 25OHVITD3, TESTOFREE, TESTOSTERONE  Inflammation (CRP: Acute Phase) (ESR: Chronic Phase) No results found for: CRP, ESRSEDRATE, LATICACIDVEN       Note: Above Lab results reviewed.  Recent Imaging Review  DG PAIN CLINIC C-ARM 1-60 MIN NO REPORT Fluoro was used, but no Radiologist interpretation will be provided.  Please refer to NOTES tab for provider progress note. Note: Reviewed        Physical Exam  Vitals: There were no vitals taken for this visit. BMI: Estimated body mass index is 20.19 kg/m as calculated from the following:   Height as of 06/22/24: 5' 3 (1.6 m).   Weight as of 06/22/24: 114  lb (51.7 kg). Ideal: Patient weight not recorded General appearance: Well nourished, well developed, and well hydrated. In no apparent acute distress Mental status: Alert, oriented x 3 (person, place, & time)       Respiratory: No evidence of acute respiratory distress Eyes: PERLA   Assessment   Diagnosis Status  1. Chronic low back pain (Right) w/ radicular pain (Right)   2. Chronic lower extremity pain (3ry area of Pain) (Right)   3. Chronic lumbar radicular pain (Right)   4. Postop check    Controlled Controlled Controlled   Updated Problems: No problems updated.  Plan of Care  Problem-specific:  Assessment and Plan            Brittney Simpson has a current medication list which includes the following long-term medication(s): amlodipine , clonazepam , furosemide , levothyroxine , lisinopril , metoprolol  succinate, omeprazole , tramadol , and trazodone .  Pharmacotherapy (Medications Ordered): No orders of the defined types were placed in this encounter.  Orders:  No orders of the defined types were placed in this encounter.    Interventional Therapies  Risk  Complexity Considerations:   Advanced age  HOH  AAA  HTN  Stage3 CKD  GERD   Note: Hard of  hearing   Planned  Pending:   Therapeutic ML-right L5-S1 LESI #1    Under consideration:   (PRN) palliative right L1-2 LESI #14    Completed:   Palliative right L1-2 LESI x13 (02/17/2024) (previous: 100/100/75/75)  Therapeutic right L2-3 LESI x1 (03/07/2020) (100/100/25/<50)  Diagnostic/therapeutic bilateral SI joint injection x1 (02/10/2020) (100/100/100 x2 days/0)  Palliative right lumbar facet MBB x13 (01/04/2020) (8 to 0) (100/100/100)  Palliative left lumbar facet MBB x11 (01/04/2020) (8 to 0) (100/100/100)    Therapeutic  Palliative (PRN) options:   Palliative right T12-L1 LESI (PRN)   Pharmacotherapy  Nonopioids transferred 04/11/2020: Robaxin       No follow-ups on file.    Recent Visits Date Type Provider Dept  06/22/24 Procedure visit Tanya Glisson, MD [facility]  Showing recent visits within past 90 days and meeting all other requirements Future Appointments Date Type Provider Dept  07/12/24 Appointment Tanya Glisson, MD [facility]  Showing future appointments within next 90 days and meeting all other requirements  I discussed the assessment and treatment plan with the patient. The patient was provided an opportunity to ask questions and all were answered. The patient agreed with the plan and demonstrated an understanding of the instructions.  Patient advised to call back or seek an in-person evaluation if the symptoms or condition worsens.  Duration of encounter: *** minutes.  Total time on encounter, as per AMA guidelines included both the face-to-face and non-face-to-face time personally spent by the physician and/or other qualified health care professional(s) on the day of the encounter (includes time in activities that require the physician or other qualified health care professional and does not include time in activities normally performed by clinical staff). Physician's time may include the following activities when  performed: Preparing to see the patient (e.g., pre-charting review of records, searching for previously ordered imaging, lab work, and nerve conduction tests) Review of prior analgesic pharmacotherapies. Reviewing PMP Interpreting ordered tests (e.g., lab work, imaging, nerve conduction tests) Performing post-procedure evaluations, including interpretation of diagnostic procedures Obtaining and/or reviewing separately obtained history Performing a medically appropriate examination and/or evaluation Counseling and educating the patient/family/caregiver Ordering medications, tests, or procedures Referring and communicating with other health care professionals (when not separately reported) Documenting clinical information in the  electronic or other health record Independently interpreting results (not separately reported) and communicating results to the patient/ family/caregiver Care coordination (not separately reported)  Note by: Eric DELENA Como, MD (TTS and AI technology used. I apologize for any typographical errors that were not detected and corrected.) Date: 07/12/2024; Time: 1:00 PM

## 2024-07-12 ENCOUNTER — Ambulatory Visit: Attending: Pain Medicine | Admitting: Pain Medicine

## 2024-07-12 ENCOUNTER — Encounter: Payer: Self-pay | Admitting: Pain Medicine

## 2024-07-12 VITALS — BP 137/71 | HR 66 | Temp 97.7°F | Resp 16 | Ht 63.0 in | Wt 112.0 lb

## 2024-07-12 DIAGNOSIS — M79604 Pain in right leg: Secondary | ICD-10-CM | POA: Insufficient documentation

## 2024-07-12 DIAGNOSIS — Z09 Encounter for follow-up examination after completed treatment for conditions other than malignant neoplasm: Secondary | ICD-10-CM | POA: Diagnosis present

## 2024-07-12 DIAGNOSIS — M5416 Radiculopathy, lumbar region: Secondary | ICD-10-CM

## 2024-07-12 DIAGNOSIS — G8929 Other chronic pain: Secondary | ICD-10-CM | POA: Diagnosis not present

## 2024-07-12 DIAGNOSIS — M5441 Lumbago with sciatica, right side: Secondary | ICD-10-CM

## 2024-07-12 NOTE — Progress Notes (Signed)
 Safety precautions to be maintained throughout the outpatient stay will include: orient to surroundings, keep bed in low position, maintain call bell within reach at all times, provide assistance with transfer out of bed and ambulation.

## 2024-07-13 ENCOUNTER — Encounter (INDEPENDENT_AMBULATORY_CARE_PROVIDER_SITE_OTHER): Payer: Self-pay | Admitting: Vascular Surgery

## 2024-07-13 ENCOUNTER — Ambulatory Visit (INDEPENDENT_AMBULATORY_CARE_PROVIDER_SITE_OTHER): Admitting: Vascular Surgery

## 2024-07-13 VITALS — BP 164/78 | HR 61 | Resp 17 | Ht 63.0 in | Wt 114.2 lb

## 2024-07-13 DIAGNOSIS — G8929 Other chronic pain: Secondary | ICD-10-CM | POA: Diagnosis not present

## 2024-07-13 DIAGNOSIS — I10 Essential (primary) hypertension: Secondary | ICD-10-CM | POA: Diagnosis not present

## 2024-07-13 DIAGNOSIS — N1831 Chronic kidney disease, stage 3a: Secondary | ICD-10-CM | POA: Diagnosis not present

## 2024-07-13 DIAGNOSIS — M545 Low back pain, unspecified: Secondary | ICD-10-CM | POA: Diagnosis not present

## 2024-07-13 DIAGNOSIS — I7141 Pararenal abdominal aortic aneurysm, without rupture: Secondary | ICD-10-CM

## 2024-07-13 NOTE — Progress Notes (Signed)
 MRN : [id number]  Brittney Simpson is a 89 y.o. (1928/05/08) female who presents with chief complaint of  Chief Complaint  Patient presents with   Follow-up    CTA fu   .  History of Present Illness:   Discussed the use of AI scribe software for clinical note transcription with the patient, who gave verbal consent to proceed.  History of Present Illness Brittney Simpson is a 89 year old female with an abdominal aortic aneurysm who presents for follow-up evaluation. She is accompanied by her daughter.  The abdominal aortic aneurysm measures 5.3 cm, unchanged from 5.2 cm six months ago.  This was measured on a CT angiogram which I have independently reviewed and agree with the report.  She has chronic low back pain.  She denies any current aneurysm related symptoms such as new abdominal or back pain or signs of peripheral embolization.    Results RADIOLOGY Aneurysm Measurement: 5.3 cm (07/12/2024)  Current Outpatient Medications  Medication Sig Dispense Refill   acetaminophen  (TYLENOL ) 500 MG tablet Take 1,000 mg by mouth daily as needed for moderate pain or headache.     alendronate  (FOSAMAX ) 70 MG tablet Take 1 tablet (70 mg total) by mouth every 7 (seven) days. Take with a full glass of water on an empty stomach. 4 tablet 11   amLODipine  (NORVASC ) 5 MG tablet Take 1 tablet (5 mg total) by mouth daily. 90 tablet 3   brimonidine (ALPHAGAN) 0.2 % ophthalmic solution Place 1 drop into the left eye 2 (two) times daily.     Cholecalciferol (VITAMIN D3) 50 MCG (2000 UT) TABS Take 2,000 Units by mouth daily with lunch.     clonazePAM  (KLONOPIN ) 0.5 MG tablet TAKE 1/2 TABLET BY MOUTH TWICE DAILY 30 tablet 5   Cranberry 500 MG CAPS Take 1,000 mg by mouth daily with supper.     furosemide  (LASIX ) 20 MG tablet TAKE 1 and 1/2 TABLETS BY MOUTH ONCE DAILY 135 tablet 0   latanoprost (XALATAN) 0.005 % ophthalmic solution Place 1 drop into both eyes at bedtime.      levothyroxine   (SYNTHROID ) 112 MCG tablet TAKE 1 TABLET BY MOUTH ONCE DAILY ON AN EMPTY STOMACH. WAIT 30 MINUTES BEFORE TAKING OTHER MEDS. 90 tablet 3   lisinopril  (ZESTRIL ) 40 MG tablet Take 1 tablet (40 mg total) by mouth daily. 90 tablet 3   Melatonin 10 MG TABS Take 10 mg by mouth at bedtime.     metoprolol  succinate (TOPROL -XL) 100 MG 24 hr tablet Take 1 tablet (100 mg total) by mouth daily. with food 90 tablet 3   omeprazole  (PRILOSEC) 20 MG capsule TAKE 1 CAPSULE BY MOUTH ONCE DAILY BEFORE BREAKFAST 90 capsule 0   tamsulosin  (FLOMAX ) 0.4 MG CAPS capsule Take 2 capsules (0.8 mg total) by mouth daily. 180 capsule 3   traMADol  (ULTRAM ) 50 MG tablet Take 1 tablet (50 mg total) by mouth 3 (three) times daily. Each refill must last 30 days. 90 tablet 5   traZODone  (DESYREL ) 50 MG tablet Take 1 tablet (50 mg total) by mouth at bedtime. 90 tablet 1   triamcinolone  ointment (KENALOG ) 0.1 % Apply 1 gram twice daily to affected areas of skin. Stop once resolved and restart as needed for flares. Avoid use on face, armpits, groin unless otherwise indicated. 30 g 0   Trospium  Chloride 60 MG CP24 Take 1 capsule (60 mg total) by mouth daily. 30 capsule 11   No current facility-administered medications for this  visit.    Past Medical History:  Diagnosis Date   Anxiety    Arthritis    Back ache    Hiatal hernia    Hyperlipidemia    Hypertension    Hypothyroid    Osteopenia    Thyroid  disease    UTI (urinary tract infection)     Past Surgical History:  Procedure Laterality Date   CHOLECYSTECTOMY     HEMORRHOID SURGERY     HERNIA REPAIR     HERNIA REPAIR     KYPHOPLASTY N/A 01/23/2017   Procedure: [id number];  Surgeon: Kathlynn Sharper, MD;  Location: [facility];  Service: Orthopedics;  Laterality: N/A;   KYPHOPLASTY N/A 01/25/2020   Procedure: L4 KYPHOPLASTY;  Surgeon: Kathlynn Sharper, MD;  Location: [facility];  Service: Orthopedics;  Laterality: N/A;     Social History[1]    Family History  Problem  Relation Age of Onset   Stroke Mother    Cancer Father      Allergies[2]    REVIEW OF SYSTEMS (Negative unless checked)   Constitutional: [] Weight loss  [] Fever  [] Chills Cardiac: [] Chest pain   [] Chest pressure   [] Palpitations   [] Shortness of breath when laying flat   [] Shortness of breath at rest   [] Shortness of breath with exertion. Vascular:  [] Pain in legs with walking   [] Pain in legs at rest   [] Pain in legs when laying flat   [] Claudication   [] Pain in feet when walking  [] Pain in feet at rest  [] Pain in feet when laying flat   [] History of DVT   [] Phlebitis   [] Swelling in legs   [] Varicose veins   [] Non-healing ulcers Pulmonary:   [] Uses home oxygen   [] Productive cough   [] Hemoptysis   [] Wheeze  [] COPD   [] Asthma Neurologic:  [] Dizziness  [] Blackouts   [] Seizures   [] History of stroke   [] History of TIA  [] Aphasia   [] Temporary blindness   [] Dysphagia   [] Weakness or numbness in arms   [] Weakness or numbness in legs Musculoskeletal:  [x] Arthritis   [] Joint swelling   [] Joint pain   [x] Low back pain Hematologic:  [] Easy bruising  [] Easy bleeding   [] Hypercoagulable state   [] Anemic  [] Hepatitis Gastrointestinal:  [] Blood in stool   [] Vomiting blood  [] Gastroesophageal reflux/heartburn   [] Abdominal pain Genitourinary:  [] Chronic kidney disease   [] Difficult urination  [x] Frequent urination  [] Burning with urination   [] Hematuria Skin:  [] Rashes   [] Ulcers   [] Wounds Psychological:  [x] History of anxiety   []  History of major depression  Physical Examination  BP (!) 164/78   Pulse 61   Resp 17   Ht 5' 3 (1.6 m)   Wt 114 lb 3.2 oz (51.8 kg)   BMI 20.23 kg/m  Gen:  WD/WN, NAD. Appears younger than stated age. Head: [place]/AT, No temporalis wasting. Ear/Nose/Throat: Hearing grossly intact, nares w/o erythema or drainage Eyes: Conjunctiva clear. Sclera non-icteric Neck: Supple.  Trachea midline Pulmonary:  Good air movement, no use of accessory muscles.  Cardiac:  bradycardic Vascular:  Vessel Right Left  Radial Palpable Palpable           Musculoskeletal: M/S 5/5 throughout.  No deformity or atrophy. No edema. Neurologic: Sensation grossly intact in extremities.  Symmetrical.  Speech is fluent.  Psychiatric: Judgment intact, Mood & affect appropriate for pt's clinical situation. Dermatologic: No rashes or ulcers noted.  No cellulitis or open wounds.  Physical Exam     Labs Recent Results (from the past  2160 hours)  TSH     Status: None   Collection Time: 04/26/24  9:09 AM  Result Value Ref Range   TSH 2.09 0.40 - 4.50 mIU/L  Lipid panel     Status: Abnormal   Collection Time: 04/26/24  9:09 AM  Result Value Ref Range   Cholesterol 199 <200 mg/dL   HDL 39 (L) > OR = 50 mg/dL   Triglycerides 855 <849 mg/dL   LDL Cholesterol (Calc) 133 (H) mg/dL (calc)    Comment: Reference range: <100 . Desirable range <100 mg/dL for primary prevention;   <70 mg/dL for patients with CHD or diabetic patients  with > or = 2 CHD risk factors. SABRA LDL-C is now calculated using the Martin-Hopkins  calculation, which is a validated novel method providing  better accuracy than the Friedewald equation in the  estimation of LDL-C.  Gladis APPLETHWAITE et al. SANDREA. 7986;689(80): 2061-2068  (http://education.[facility].com/faq/FAQ164)    Total CHOL/HDL Ratio 5.1 (H) <5.0 (calc)   Non-HDL Cholesterol (Calc) 160 (H) <130 mg/dL (calc)    Comment: For patients with diabetes plus 1 major ASCVD risk  factor, treating to a non-HDL-C goal of <100 mg/dL  (LDL-C of <29 mg/dL) is considered a therapeutic  option.   Hemoglobin A1c     Status: None   Collection Time: 04/26/24  9:09 AM  Result Value Ref Range   Hgb A1c MFr Bld 5.6 <5.7 %    Comment: For the purpose of screening for the presence of diabetes: . <5.7%       Consistent with the absence of diabetes 5.7-6.4%    Consistent with increased risk for diabetes             (prediabetes) > or =6.5%  Consistent  with diabetes . This assay result is consistent with a decreased risk of diabetes. . Currently, no consensus exists regarding use of hemoglobin A1c for diagnosis of diabetes in children. . According to American Diabetes Association (ADA) guidelines, hemoglobin A1c <7.0% represents optimal control in non-pregnant diabetic patients. Different metrics may apply to specific patient populations.  Standards of Medical Care in Diabetes(ADA). .    Mean Plasma Glucose 114 mg/dL   eAG (mmol/L) 6.3 mmol/L  CBC with Differential/Platelet     Status: Abnormal   Collection Time: 04/26/24  9:09 AM  Result Value Ref Range   WBC 4.8 3.8 - 10.8 Thousand/uL   RBC 4.47 3.80 - 5.10 Million/uL   Hemoglobin 11.0 (L) 11.7 - 15.5 g/dL   HCT 63.0 64.9 - 54.9 %   MCV 82.6 80.0 - 100.0 fL   MCH 24.6 (L) 27.0 - 33.0 pg   MCHC 29.8 (L) 32.0 - 36.0 g/dL    Comment: For adults, a slight decrease in the calculated MCHC value (in the range of 30 to 32 g/dL) is most likely not clinically significant; however, it should be interpreted with caution in correlation with other red cell parameters and the patient's clinical condition.    RDW 15.3 (H) 11.0 - 15.0 %   Platelets 207 140 - 400 Thousand/uL   MPV 10.5 7.5 - 12.5 fL   Neutro Abs 2,678 1,500 - 7,800 cells/uL   Absolute Lymphocytes 1,546 850 - 3,900 cells/uL   Absolute Monocytes 547 200 - 950 cells/uL   Eosinophils Absolute 0 (L) 15 - 500 cells/uL   Basophils Absolute 29 0 - 200 cells/uL   Neutrophils Relative % 55.8 %   Total Lymphocyte 32.2 %   Monocytes Relative 11.4 %  Eosinophils Relative 0.0 %   Basophils Relative 0.6 %  Comprehensive metabolic panel with GFR     Status: Abnormal   Collection Time: 04/26/24  9:09 AM  Result Value Ref Range   Glucose, Bld 97 65 - 99 mg/dL    Comment: .            Fasting reference interval .    BUN 11 7 - 25 mg/dL   Creat 9.20 9.39 - 9.04 mg/dL   eGFR 69 > OR = 60 fO/fpw/8.26f7   BUN/Creatinine Ratio  SEE NOTE: 6 - 22 (calc)    Comment:    Not Reported: BUN and Creatinine are within    reference range. .    Sodium 136 135 - 146 mmol/L   Potassium 4.5 3.5 - 5.3 mmol/L   Chloride 100 98 - 110 mmol/L   CO2 30 20 - 32 mmol/L   Calcium 9.1 8.6 - 10.4 mg/dL   Total Protein 6.8 6.1 - 8.1 g/dL   Albumin 3.9 3.6 - 5.1 g/dL   Globulin 2.9 1.9 - 3.7 g/dL (calc)   AG Ratio 1.3 1.0 - 2.5 (calc)   Total Bilirubin 0.5 0.2 - 1.2 mg/dL   Alkaline phosphatase (APISO) 53 37 - 153 U/L   AST 9 (L) 10 - 35 U/L   ALT 7 6 - 29 U/L  T4, free     Status: None   Collection Time: 04/26/24  9:09 AM  Result Value Ref Range   Free T4 1.5 0.8 - 1.8 ng/dL  Urinalysis, Complete     Status: None   Collection Time: 05/31/24  9:49 AM  Result Value Ref Range   Specific Gravity, UA 1.010 1.005 - 1.030   pH, UA 6.0 5.0 - 7.5   Color, UA Yellow Yellow   Appearance Ur Clear Clear   Leukocytes,UA Negative Negative   Protein,UA Negative Negative/Trace   Glucose, UA Negative Negative   Ketones, UA Negative Negative   RBC, UA Negative Negative   Bilirubin, UA Negative Negative   Urobilinogen, Ur 0.2 0.2 - 1.0 mg/dL   Nitrite, UA Negative Negative   Microscopic Examination Comment     Comment: Microscopic follows if indicated.   Microscopic Examination See below:     Comment: Microscopic was indicated and was performed.  Microscopic Examination     Status: None   Collection Time: 05/31/24  9:49 AM   Urine  Result Value Ref Range   WBC, UA 0-5 0 - 5 /hpf   RBC, Urine None seen 0 - 2 /hpf   Epithelial Cells (non renal) 0-10 0 - 10 /hpf   Bacteria, UA None seen None seen/Few  CULTURE, URINE COMPREHENSIVE     Status: Abnormal   Collection Time: 05/31/24 10:39 AM   Specimen: Urine   UR  Result Value Ref Range   Urine Culture, Comprehensive Final report (A)    Organism ID, Bacteria Escherichia coli (A)     Comment: Cefazolin  with an MIC <=16 predicts susceptibility to the oral agents cefaclor, cefdinir,  cefpodoxime, cefprozil, cefuroxime, cephalexin, and loracarbef when used for therapy of uncomplicated urinary tract infections due to E. coli, Klebsiella pneumoniae, and Proteus mirabilis. Multi-Drug Resistant Organism 10,000-25,000 colony forming units per mL    Organism ID, Bacteria Comment     Comment: Mixed urogenital flora 1,000 Colonies/mL    ANTIMICROBIAL SUSCEPTIBILITY Comment     Comment:       ** S = Susceptible; I = Intermediate; R = Resistant **  P = Positive; N = Negative             MICS are expressed in micrograms per mL    Antibiotic                 RSLT#1    RSLT#2    RSLT#3    RSLT#4 Amoxicillin /Clavulanic Acid    I Ampicillin                     R Cefazolin                       S Cefepime                       S Cefoxitin                      S Cefpodoxime                    S Ceftriaxone                    S Ciprofloxacin                   S Ertapenem                      S Gentamicin                     R Levofloxacin                   S Meropenem                      S Nitrofurantoin                  S Piperacillin/Tazobactam        S Tetracycline                   R Tobramycin                     S Trimethoprim/Sulfa             R     Radiology DG PAIN CLINIC C-ARM 1-60 MIN NO REPORT Result Date: 06/22/2024 Fluoro was used, but no Radiologist interpretation will be provided. Please refer to NOTES tab for provider progress note.  CT Angio Abd/Pel w/ and/or w/o Result Date: 06/20/2024 CLINICAL DATA:  Abdominal aortic aneurysm EXAM: CTA ABDOMEN AND PELVIS WITHOUT AND WITH CONTRAST TECHNIQUE: Multidetector CT imaging of the abdomen and pelvis was performed using the standard protocol during bolus administration of intravenous contrast. Multiplanar reconstructed images and MIPs were obtained and reviewed to evaluate the vascular anatomy. RADIATION DOSE REDUCTION: This exam was performed according to the departmental dose-optimization  program which includes automated exposure control, adjustment of the mA and/or kV according to patient size and/or use of iterative reconstruction technique. CONTRAST:  75mL OMNIPAQUE  IOHEXOL  350 MG/ML SOLN COMPARISON:  12/02/2023 FINDINGS: VASCULAR Aorta: Marked ectasia and tortuosity of the thoracoabdominal aorta again noted. Bilobed abdominal aortic aneurysm is again noted. The juxtarenal aneurysm component measures 5.3 x 5.1 cm, reference image 51/4 and image 66/10, unchanged since prior exam. The infrarenal component just proximal to the aortic bifurcation measures 4.2 x 3.9 cm, reference image 85/4 and image 55/10, also stable since prior exam. There is diffuse aortic atherosclerosis. No evidence  of dissection or critical stenosis. Celiac: Patent without evidence of aneurysm, dissection, vasculitis or significant stenosis. Diffuse atherosclerosis. SMA: Patent without evidence of aneurysm, dissection, vasculitis or significant stenosis. Renals: The bilateral renal arteries are patent, and arise along the superior extent of the upper aneurysm component described above. Atherosclerosis at the origin of the renal arteries, left greater than right. Estimated 50% stenosis within the proximal left renal artery. Right renal artery is widely patent. No aneurysm, dissection, vasculitis, or fibromuscular dysplasia. IMA: Patent without evidence of aneurysm, dissection, vasculitis or significant stenosis. Inflow: The right common iliac artery measures up to 15 mm and left common iliac artery 12 mm in maximal diameter. Diffuse atherosclerosis without critical stenosis, dissection, or vasculitis. Proximal Outflow: Bilateral common femoral and visualized portions of the superficial and profunda femoral arteries are patent without evidence of aneurysm, dissection, vasculitis or significant stenosis. Diffuse atherosclerosis. Veins: No obvious venous abnormality within the limitations of this arterial phase study. Review of the  MIP images confirms the above findings. NON-VASCULAR Lower chest: No acute pleural or parenchymal lung disease. Cardiomegaly with prominent biatrial dilatation. Hepatobiliary: No focal liver abnormality is seen. Status post cholecystectomy. No biliary dilatation. Pancreas: Unremarkable. No pancreatic ductal dilatation or surrounding inflammatory changes. Spleen: Normal in size without focal abnormality. Adrenals/Urinary Tract: No urinary tract calculi or obstructive uropathy. The adrenals are stable. Bladder is unremarkable. Stomach/Bowel: No bowel obstruction or ileus. Normal appendix right lower quadrant. No bowel wall thickening or inflammatory change. Moderate hiatal hernia. Lymphatic: No pathologic adenopathy. Reproductive: Uterus and bilateral adnexa are unremarkable. Other: No free fluid or free intraperitoneal gas. Small left inguinal hernia containing a portion of the distal small bowel. No evidence of incarceration or obstruction. Musculoskeletal: No acute or destructive bony abnormalities. Chronic compression deformities at T12, L3, L4, and L5. Prior vertebral augmentations at T12 and L4. Reconstructed images demonstrate no additional findings. IMPRESSION: VASCULAR 1. Stable bilobed abdominal aortic aneurysm as above, with the superior juxtarenal component measuring up to 5.3 x 5.1 cm and the inferior component just above the bifurcation measuring 4.2 x 3.9 cm. Recommend CTA or MRA, as appropriate, in 6 months and referral to a vascular specialist. Reference: Journal of Vascular Surgery 67.1 (2018): 2-77. J Am Coll Radiol 2013;10:789-794. 2. Mild stenosis at the origin of the left renal artery, approaching 50%. 3.  Aortic Atherosclerosis (ICD10-I70.0). NON-VASCULAR 1. Small left inguinal hernia containing a portion of the small bowel. No obstruction or incarceration. 2. Stable multilevel thoracolumbar compression deformities. No acute fractures. 3. Hiatal hernia. Electronically Signed   By: Ozell Daring  M.D.   On: 06/20/2024 20:04    Assessment/Plan  No problem-specific Assessment & Plan notes found for this encounter.  Assessment and Plan Assessment & Plan Pararenal abdominal aortic aneurysm without rupture Aneurysm stable at 5.3 cm on recent CT scan, unchanged from 5.2 cm six months ago. Surgical intervention not indicated until 5.5 to 6 cm due to proximity to renal and mesenteric arteries and her advanced age. - Ordered follow-up scan in six months without contrast due to chronic kidney disease. - Scheduled follow-up appointment one to two weeks after the scan.  Essential hypertension blood pressure control important in reducing the progression of atherosclerotic disease and aneurysmal degeneration. On appropriate oral medications.     Hyperlipidemia lipid control important in reducing the progression of atherosclerotic disease. Continue statin therapy     Chronic low back pain (Bilateral) (R>L) This not likely from her aneurysm   CKD (chronic kidney disease) stage 3,  GFR 30-59 ml/min Sees nephrology.     Selinda Gu, MD  07/13/2024 9:40 AM    This note was created with Dragon medical transcription system.  Any errors from dictation are purely unintentional     [1]  Social History Tobacco Use   Smoking status: Never   Smokeless tobacco: Never  Vaping Use   Vaping status: Never Used  Substance Use Topics   Alcohol use: No    Alcohol/week: 0.0 standard drinks of alcohol   Drug use: No  [2] No Known Allergies

## 2024-07-19 ENCOUNTER — Other Ambulatory Visit: Payer: Self-pay | Admitting: Family Medicine

## 2024-07-19 DIAGNOSIS — N183 Chronic kidney disease, stage 3 unspecified: Secondary | ICD-10-CM

## 2024-07-19 DIAGNOSIS — F5101 Primary insomnia: Secondary | ICD-10-CM

## 2024-07-21 ENCOUNTER — Other Ambulatory Visit: Payer: Self-pay | Admitting: Family Medicine

## 2024-07-21 DIAGNOSIS — I129 Hypertensive chronic kidney disease with stage 1 through stage 4 chronic kidney disease, or unspecified chronic kidney disease: Secondary | ICD-10-CM

## 2024-07-21 NOTE — Telephone Encounter (Unsigned)
 Copied from CRM #[id number]. Topic: Clinical - Medication Refill >> Jul 21, 2024 11:19 AM Tiffini S wrote: Medication: furosemide  (LASIX ) 20 MG tablet  Has the patient contacted their pharmacy? Yes (Agent: If no, request that the patient contact the pharmacy for the refill. If patient does not wish to contact the pharmacy document the reason why and proceed with request.) (Agent: If yes, when and what did the pharmacy advise?)  This is the patient's preferred pharmacy:  [facility] - [place], [place] - [address] [place] [address] Phone: [phone] Fax: [phone]  Is this the correct pharmacy for this prescription? Yes If no, delete pharmacy and type the correct one.   Has the prescription been filled recently? Yes  Is the patient out of the medication? No  Has the patient been seen for an appointment in the last year OR does the patient have an upcoming appointment? Yes  Can we respond through MyChart? No, please call [phone]  Agent: Please be advised that Rx refills may take up to 3 business days. We ask that you follow-up with your pharmacy.

## 2024-07-21 NOTE — Telephone Encounter (Signed)
 Requested by interface surescripts. No future visit scheduled.  Protocol met . Needs future visit for further refills.  Requested Prescriptions  Pending Prescriptions Disp Refills   furosemide  (LASIX ) 20 MG tablet [Pharmacy Med Name: FUROSEMIDE  20 MG TAB] 135 tablet 0    Sig: TAKE 1 and 1/2 TABLETS BY MOUTH ONCE DAILY     Cardiovascular:  Diuretics - Loop Failed - 07/21/2024 12:28 PM      Failed - Mg Level in normal range and within 180 days    Magnesium   Date Value Ref Range Status  01/14/2014 2.1 mg/dL Final    Comment:    8.1-7.5 THERAPEUTIC RANGE: 4-7 mg/dL TOXIC: > 10 mg/dL  -----------------------          Failed - Last BP in normal range    BP Readings from Last 1 Encounters:  07/13/24 (!) 164/78         Passed - K in normal range and within 180 days    Potassium  Date Value Ref Range Status  04/26/2024 4.5 3.5 - 5.3 mmol/L Final  11/24/2014 4.2 mmol/L Final    Comment:    3.5-5.1 NOTE: New Reference Range  10/04/14          Passed - Ca in normal range and within 180 days    Calcium  Date Value Ref Range Status  04/26/2024 9.1 8.6 - 10.4 mg/dL Final   Calcium, Total  Date Value Ref Range Status  11/24/2014 8.9 mg/dL Final    Comment:    1.0-89.6 NOTE: New Reference Range  10/04/14          Passed - Na in normal range and within 180 days    Sodium  Date Value Ref Range Status  04/26/2024 136 135 - 146 mmol/L Final  11/24/2014 131 (L) mmol/L Final    Comment:    135-145 NOTE: New Reference Range  10/04/14          Passed - Cr in normal range and within 180 days    Creat  Date Value Ref Range Status  04/26/2024 0.79 0.60 - 0.95 mg/dL Final         Passed - Cl in normal range and within 180 days    Chloride  Date Value Ref Range Status  04/26/2024 100 98 - 110 mmol/L Final  11/24/2014 97 (L) mmol/L Final    Comment:    101-111 NOTE: New Reference Range  10/04/14          Passed - Valid encounter within last 6 months    Recent  Outpatient Visits           4 months ago Chronic pain syndrome   [place] [facility] Marsa PARAS, DO       Future Appointments             In 5 days Gaston Hamilton, MD [facility] [place]             traZODone  (DESYREL ) 50 MG tablet [Pharmacy Med Name: TRAZODONE  HCL 50 MG TAB] 90 tablet 0    Sig: TAKE 1 TABLET BY MOUTH AT BEDTIME     Psychiatry: Antidepressants - Serotonin Modulator Passed - 07/21/2024 12:28 PM      Passed - Completed PHQ-2 or PHQ-9 in the last 360 days      Passed - Valid encounter within last 6 months    Recent Outpatient Visits           4  months ago Chronic pain syndrome   [place] [facility], Marsa PARAS, DO       Future Appointments             In 5 days MacDiarmid, [place], MD [facility]

## 2024-07-23 NOTE — Telephone Encounter (Signed)
 Called [facility] to confirm receipt to pharmacy on 07/21/24 at 12:29 pm. No answer and unable to leave message to call back.

## 2024-07-23 NOTE — Telephone Encounter (Signed)
 Requested by patient. Already signed 07/21/24. Receipt confirmed by pharmacy 07/21/24 at 12:29 pm. Duplicate request. Requested Prescriptions  Refused Prescriptions Disp Refills   furosemide  (LASIX ) 20 MG tablet 135 tablet 0    Sig: Take 1.5 tablets (30 mg total) by mouth daily.     Cardiovascular:  Diuretics - Loop Failed - 07/23/2024  2:12 PM      Failed - Mg Level in normal range and within 180 days    Magnesium   Date Value Ref Range Status  01/14/2014 2.1 mg/dL Final    Comment:    8.1-7.5 THERAPEUTIC RANGE: 4-7 mg/dL TOXIC: > 10 mg/dL  -----------------------          Failed - Last BP in normal range    BP Readings from Last 1 Encounters:  07/13/24 (!) 164/78         Passed - K in normal range and within 180 days    Potassium  Date Value Ref Range Status  04/26/2024 4.5 3.5 - 5.3 mmol/L Final  11/24/2014 4.2 mmol/L Final    Comment:    3.5-5.1 NOTE: New Reference Range  10/04/14          Passed - Ca in normal range and within 180 days    Calcium  Date Value Ref Range Status  04/26/2024 9.1 8.6 - 10.4 mg/dL Final   Calcium, Total  Date Value Ref Range Status  11/24/2014 8.9 mg/dL Final    Comment:    1.0-89.6 NOTE: New Reference Range  10/04/14          Passed - Na in normal range and within 180 days    Sodium  Date Value Ref Range Status  04/26/2024 136 135 - 146 mmol/L Final  11/24/2014 131 (L) mmol/L Final    Comment:    135-145 NOTE: New Reference Range  10/04/14          Passed - Cr in normal range and within 180 days    Creat  Date Value Ref Range Status  04/26/2024 0.79 0.60 - 0.95 mg/dL Final         Passed - Cl in normal range and within 180 days    Chloride  Date Value Ref Range Status  04/26/2024 100 98 - 110 mmol/L Final  11/24/2014 97 (L) mmol/L Final    Comment:    101-111 NOTE: New Reference Range  10/04/14          Passed - Valid encounter within last 6 months    Recent Outpatient Visits           4 months ago  Chronic pain syndrome   [facility] Marsa PARAS, DO       Future Appointments             In 3 days Gaston Hamilton, MD [facility]

## 2024-07-23 NOTE — Telephone Encounter (Signed)
 Called pharmacy again, pharmacy staff reports medication is ready for pick up .

## 2024-07-26 ENCOUNTER — Ambulatory Visit (INDEPENDENT_AMBULATORY_CARE_PROVIDER_SITE_OTHER): Admitting: Urology

## 2024-07-26 VITALS — BP 97/59 | HR 70 | Ht 63.0 in | Wt 120.0 lb

## 2024-07-26 DIAGNOSIS — N3941 Urge incontinence: Secondary | ICD-10-CM | POA: Diagnosis not present

## 2024-07-26 LAB — MICROSCOPIC EXAMINATION: WBC, UA: >30 /HPF — AB (ref 0–5)

## 2024-07-26 LAB — URINALYSIS, COMPLETE
Bilirubin, UA: NEGATIVE
Glucose, UA: NEGATIVE
Ketones, UA: NEGATIVE
Nitrite, UA: NEGATIVE
Specific Gravity, UA: 1.015 (ref 1.005–1.030)
Urobilinogen, Ur: 0.2 mg/dL (ref 0.2–1.0)
pH, UA: 7 (ref 5.0–7.5)

## 2024-07-26 MED ORDER — TROSPIUM CHLORIDE ER 60 MG PO CP24: 60.0000 mg | ORAL_CAPSULE | Freq: Every day | ORAL | 3 refills | Status: DC

## 2024-07-26 NOTE — Progress Notes (Signed)
 "  07/26/2024 10:10 AM   Brittney Simpson 23-Apr-1928 [id number]  Referring provider: Edman Marsa PARAS, DO [address] [place],  [place] [address]  No chief complaint on file.   HPI: Consulted to assess the patient for possible bladder infection and incontinence.  History was limited and patient's daughter was helpful.  Patient is hard of hearing.  It appeared she was wearing 2 depends a day in the last 2 weeks she is not leaking but has trouble to go.  She will feel an urge and then she cannot go.  She voids 4-5 times a day.  She will then double void and strain.  She says she is not getting up at night to urinate   Patient likely having obstructive voiding secondary to overactive bladder.  Her referring doctor wanted to place her on mirabegron  which I think is a good choice but apparently it was expensive.  Because of the obstructive component I decided to give her Flomax  and perform cystoscopy in the next 3 weeks and proceed accordingly.   Last culture negative Patient clinically not infected today with little bit of burning yesterday.  Urine sent for culture.  Urine looks good She did not want the cystoscopy because the Flomax  has dramatically improved her symptoms.  Flow much better and she feels empty   Today Frequency improved with Flomax .  Flow improved.  Little bit of burning today.  Flomax  renewed   Today Patient was having trouble to void and family doctor increase it to 0.8 mg of Flomax  he thought it worked better.  She has some incontinence but overall is doing well and likes the higher dose.  Flomax  60 tablets and 0.8 mg a day with 11 refills given. Watchful waiting for incontinence. She was having a lot of flow symptoms a year ago. I will see her in a year    Today Flow stable.  Incontinence worse using 3 pads a day that are quite wet.  She has bedwetting.  Clinically not infected.  Still on Flomax .  Interested in trying overactive bladder medication.  Never tried  Myrbetriq  due to cost.    Stay on Flomax  0.8 mg. 90 days and 3 refills sent to pharmacy. Reassess on Gemtesa  samples and prescription. Have reasonable treatment goals. PTNS an option   Dec 29/2025 Could not afford the Gemtesa  that helped.  Still has urge incontinence frequency.  On trospium  tolerating it and it helps some.  No infections.   PMH: Past Medical History:  Diagnosis Date   Anxiety    Arthritis    Back ache    Hiatal hernia    Hyperlipidemia    Hypertension    Hypothyroid    Osteopenia    Thyroid  disease    UTI (urinary tract infection)     Surgical History: Past Surgical History:  Procedure Laterality Date   CHOLECYSTECTOMY     HEMORRHOID SURGERY     HERNIA REPAIR     HERNIA REPAIR     KYPHOPLASTY N/A 01/23/2017   Procedure: [id number];  Surgeon: Kathlynn Sharper, MD;  Location: [facility] ORS;  Service: Orthopedics;  Laterality: N/A;   KYPHOPLASTY N/A 01/25/2020   Procedure: L4 KYPHOPLASTY;  Surgeon: Kathlynn Sharper, MD;  Location: [facility] ORS;  Service: Orthopedics;  Laterality: N/A;    Home Medications:  Allergies as of 07/26/2024   No Known Allergies      Medication List        Accurate as of July 26, 2024 10:10 AM. If  you have any questions, ask your nurse or doctor.          acetaminophen  500 MG tablet Commonly known as: TYLENOL  Take 1,000 mg by mouth daily as needed for moderate pain or headache.   alendronate  70 MG tablet Commonly known as: FOSAMAX  Take 1 tablet (70 mg total) by mouth every 7 (seven) days. Take with a full glass of water on an empty stomach.   amLODipine  5 MG tablet Commonly known as: NORVASC  Take 1 tablet (5 mg total) by mouth daily.   brimonidine 0.2 % ophthalmic solution Commonly known as: ALPHAGAN Place 1 drop into the left eye 2 (two) times daily.   clonazePAM  0.5 MG tablet Commonly known as: KLONOPIN  TAKE 1/2 TABLET BY MOUTH TWICE DAILY   Cranberry 500 MG Caps Take 1,000 mg by mouth daily with supper.    furosemide  20 MG tablet Commonly known as: LASIX  TAKE 1 and 1/2 TABLETS BY MOUTH ONCE DAILY   latanoprost 0.005 % ophthalmic solution Commonly known as: XALATAN Place 1 drop into both eyes at bedtime.   levothyroxine  112 MCG tablet Commonly known as: SYNTHROID  TAKE 1 TABLET BY MOUTH ONCE DAILY ON AN EMPTY STOMACH. WAIT 30 MINUTES BEFORE TAKING OTHER MEDS.   lisinopril  40 MG tablet Commonly known as: ZESTRIL  Take 1 tablet (40 mg total) by mouth daily.   Melatonin 10 MG Tabs Take 10 mg by mouth at bedtime.   metoprolol  succinate 100 MG 24 hr tablet Commonly known as: TOPROL -XL Take 1 tablet (100 mg total) by mouth daily. with food   omeprazole  20 MG capsule Commonly known as: PRILOSEC TAKE 1 CAPSULE BY MOUTH ONCE DAILY BEFORE BREAKFAST   tamsulosin  0.4 MG Caps capsule Commonly known as: FLOMAX  Take 2 capsules (0.8 mg total) by mouth daily.   traMADol  50 MG tablet Commonly known as: ULTRAM  Take 1 tablet (50 mg total) by mouth 3 (three) times daily. Each refill must last 30 days.   traZODone  50 MG tablet Commonly known as: DESYREL  TAKE 1 TABLET BY MOUTH AT BEDTIME   triamcinolone  ointment 0.1 % Commonly known as: KENALOG  Apply 1 gram twice daily to affected areas of skin. Stop once resolved and restart as needed for flares. Avoid use on face, armpits, groin unless otherwise indicated.   Trospium  Chloride 60 MG Cp24 Take 1 capsule (60 mg total) by mouth daily.   Vitamin D3 50 MCG (2000 UT) Tabs Take 2,000 Units by mouth daily with lunch.        Allergies: Allergies[1]  Family History: Family History  Problem Relation Age of Onset   Stroke Mother    Cancer Father     Social History:  reports that she has never smoked. She has never used smokeless tobacco. She reports that she does not drink alcohol and does not use drugs.  ROS:                                        Physical Exam: There were no vitals taken for this visit.   Constitutional:  Alert and oriented, No acute distress.   Laboratory Data: Lab Results  Component Value Date   WBC 4.8 04/26/2024   HGB 11.0 (L) 04/26/2024   HCT 36.9 04/26/2024   MCV 82.6 04/26/2024   PLT 207 04/26/2024    Lab Results  Component Value Date   CREATININE 0.79 04/26/2024    No results found  for: PSA  No results found for: TESTOSTERONE  Lab Results  Component Value Date   HGBA1C 5.6 04/26/2024    Urinalysis    Component Value Date/Time   COLORURINE YELLOW (A) 03/20/2021 1536   APPEARANCEUR Clear 05/31/2024 0949   LABSPEC 1.011 03/20/2021 1536   LABSPEC 1.003 07/08/2014 0907   PHURINE 6.0 03/20/2021 1536   GLUCOSEU Negative 05/31/2024 0949   GLUCOSEU Negative 07/08/2014 0907   HGBUR NEGATIVE 03/20/2021 1536   BILIRUBINUR Negative 05/31/2024 0949   BILIRUBINUR Negative 07/08/2014 0907   KETONESUR NEGATIVE 03/20/2021 1536   PROTEINUR Negative 05/31/2024 0949   PROTEINUR NEGATIVE 03/20/2021 1536   NITRITE Negative 05/31/2024 0949   NITRITE NEGATIVE 03/20/2021 1536   LEUKOCYTESUR Negative 05/31/2024 0949   LEUKOCYTESUR NEGATIVE 03/20/2021 1536   LEUKOCYTESUR Negative 07/08/2014 0907    Pertinent Imaging:   Assessment & Plan: Discussed PTNS.  Her daughter agreed that we have to have reasonable treatment goals.  Both medications are 3 months at a time and 3 refills and I will see in a year  1. Urgency incontinence (Primary)  - Urinalysis, Complete   No follow-ups on file.  Brittney DELENA Elizabeth, MD  [facility] [address], [address] [place], [place] [address] [phone]     [1] No Known Allergies  "

## 2024-07-27 ENCOUNTER — Other Ambulatory Visit: Payer: Self-pay | Admitting: Family Medicine

## 2024-07-27 DIAGNOSIS — K219 Gastro-esophageal reflux disease without esophagitis: Secondary | ICD-10-CM

## 2024-07-28 ENCOUNTER — Other Ambulatory Visit: Payer: Self-pay | Admitting: Family Medicine

## 2024-07-28 DIAGNOSIS — K219 Gastro-esophageal reflux disease without esophagitis: Secondary | ICD-10-CM

## 2024-07-28 NOTE — Telephone Encounter (Unsigned)
 Copied from CRM #[id number]. Topic: Clinical - Medication Refill >> Jul 28, 2024  1:04 PM Zebedee SAUNDERS wrote: Medication: omeprazole  (PRILOSEC) 20 MG capsule  Has the patient contacted their pharmacy? Yes (Agent: If no, request that the patient contact the pharmacy for the refill. If patient does not wish to contact the pharmacy document the reason why and proceed with request.) (Agent: If yes, when and what did the pharmacy advise?)Pharmacy need script  This is the patient's preferred pharmacy:  [facility] - [place], [place] - [address] [place] [address] Phone: [phone] Fax: [phone]  Is this the correct pharmacy for this prescription? Yes If no, delete pharmacy and type the correct one.   Has the prescription been filled recently? Yes  Is the patient out of the medication? Yes  Has the patient been seen for an appointment in the last year OR does the patient have an upcoming appointment? Yes  Can we respond through MyChart? Yes  Agent: Please be advised that Rx refills may take up to 3 business days. We ask that you follow-up with your pharmacy.

## 2024-07-29 NOTE — Telephone Encounter (Signed)
 Requested Prescriptions  Pending Prescriptions Disp Refills   omeprazole  (PRILOSEC) 20 MG capsule [Pharmacy Med Name: OMEPRAZOLE  DR 20 MG CAP] 90 capsule 0    Sig: Take 1 capsule (20 mg total) by mouth daily. OFFICE VISIT NEEDED FOR ADDITIONAL REFILLS     Gastroenterology: Proton Pump Inhibitors Passed - 07/29/2024  8:27 AM      Passed - Valid encounter within last 12 months    Recent Outpatient Visits           4 months ago Chronic pain syndrome   [facility] [place], Marsa PARAS, DO       Future Appointments             In 12 months MacDiarmid, Glendia, MD [facility]

## 2024-07-29 NOTE — Telephone Encounter (Signed)
 Already refilled on 07/29/24 in a separate refill encounter.

## 2024-07-30 ENCOUNTER — Telehealth: Payer: Self-pay | Admitting: Urology

## 2024-07-30 DIAGNOSIS — R339 Retention of urine, unspecified: Secondary | ICD-10-CM

## 2024-07-30 DIAGNOSIS — N3941 Urge incontinence: Secondary | ICD-10-CM

## 2024-07-30 MED ORDER — NITROFURANTOIN MACROCRYSTAL 100 MG PO CAPS: 100.0000 mg | ORAL_CAPSULE | Freq: Two times a day (BID) | ORAL | 0 refills | Status: AC

## 2024-07-30 NOTE — Telephone Encounter (Signed)
 Pt daughter called and stated that she received a message on MyChart yesterday saying that her mothers labs came back and she has a UTI. She would like to know if we can call her in something to [facility] in [place].

## 2024-07-30 NOTE — Telephone Encounter (Signed)
 Per Dr. Georganne, approval given to send Macrodantin . Prescription to be sent as ordered. Patients daughter was informed and verbalized understanding.

## 2024-08-12 ENCOUNTER — Other Ambulatory Visit: Payer: Self-pay

## 2024-08-12 ENCOUNTER — Observation Stay
Admission: EM | Admit: 2024-08-12 | Discharge: 2024-08-14 | Disposition: A | Discharge status: Home / Self Care | Attending: Obstetrics and Gynecology | Admitting: Obstetrics and Gynecology

## 2024-08-12 ENCOUNTER — Emergency Department

## 2024-08-12 ENCOUNTER — Ambulatory Visit: Payer: Self-pay

## 2024-08-12 DIAGNOSIS — E039 Hypothyroidism, unspecified: Secondary | ICD-10-CM | POA: Diagnosis not present

## 2024-08-12 DIAGNOSIS — S2249XA Multiple fractures of ribs, unspecified side, initial encounter for closed fracture: Secondary | ICD-10-CM | POA: Diagnosis present

## 2024-08-12 DIAGNOSIS — E034 Atrophy of thyroid (acquired): Secondary | ICD-10-CM

## 2024-08-12 DIAGNOSIS — F5101 Primary insomnia: Secondary | ICD-10-CM

## 2024-08-12 DIAGNOSIS — Y92009 Unspecified place in unspecified non-institutional (private) residence as the place of occurrence of the external cause: Secondary | ICD-10-CM | POA: Diagnosis not present

## 2024-08-12 DIAGNOSIS — R339 Retention of urine, unspecified: Secondary | ICD-10-CM | POA: Insufficient documentation

## 2024-08-12 DIAGNOSIS — Z8781 Personal history of (healed) traumatic fracture: Secondary | ICD-10-CM

## 2024-08-12 DIAGNOSIS — Z79899 Other long term (current) drug therapy: Secondary | ICD-10-CM | POA: Insufficient documentation

## 2024-08-12 DIAGNOSIS — Z79891 Long term (current) use of opiate analgesic: Secondary | ICD-10-CM

## 2024-08-12 DIAGNOSIS — S2242XA Multiple fractures of ribs, left side, initial encounter for closed fracture: Secondary | ICD-10-CM | POA: Diagnosis not present

## 2024-08-12 DIAGNOSIS — M47816 Spondylosis without myelopathy or radiculopathy, lumbar region: Secondary | ICD-10-CM

## 2024-08-12 DIAGNOSIS — N1831 Chronic kidney disease, stage 3a: Secondary | ICD-10-CM | POA: Diagnosis present

## 2024-08-12 DIAGNOSIS — I129 Hypertensive chronic kidney disease with stage 1 through stage 4 chronic kidney disease, or unspecified chronic kidney disease: Secondary | ICD-10-CM | POA: Insufficient documentation

## 2024-08-12 DIAGNOSIS — M5135 Other intervertebral disc degeneration, thoracolumbar region: Secondary | ICD-10-CM

## 2024-08-12 DIAGNOSIS — G894 Chronic pain syndrome: Secondary | ICD-10-CM | POA: Diagnosis present

## 2024-08-12 DIAGNOSIS — G8929 Other chronic pain: Secondary | ICD-10-CM

## 2024-08-12 DIAGNOSIS — F419 Anxiety disorder, unspecified: Secondary | ICD-10-CM

## 2024-08-12 DIAGNOSIS — E875 Hyperkalemia: Secondary | ICD-10-CM

## 2024-08-12 DIAGNOSIS — I10 Essential (primary) hypertension: Secondary | ICD-10-CM | POA: Diagnosis present

## 2024-08-12 DIAGNOSIS — M818 Other osteoporosis without current pathological fracture: Secondary | ICD-10-CM | POA: Diagnosis not present

## 2024-08-12 DIAGNOSIS — W19XXXA Unspecified fall, initial encounter: Secondary | ICD-10-CM | POA: Diagnosis not present

## 2024-08-12 DIAGNOSIS — S22080S Wedge compression fracture of T11-T12 vertebra, sequela: Secondary | ICD-10-CM

## 2024-08-12 DIAGNOSIS — K219 Gastro-esophageal reflux disease without esophagitis: Secondary | ICD-10-CM

## 2024-08-12 DIAGNOSIS — M810 Age-related osteoporosis without current pathological fracture: Secondary | ICD-10-CM | POA: Diagnosis present

## 2024-08-12 DIAGNOSIS — M4854XS Collapsed vertebra, not elsewhere classified, thoracic region, sequela of fracture: Secondary | ICD-10-CM

## 2024-08-12 DIAGNOSIS — R296 Repeated falls: Secondary | ICD-10-CM | POA: Diagnosis present

## 2024-08-12 DIAGNOSIS — N3941 Urge incontinence: Secondary | ICD-10-CM

## 2024-08-12 LAB — BASIC METABOLIC PANEL WITH GFR
Anion gap: 12 (ref 5–15)
BUN: 14 mg/dL (ref 8–23)
CO2: 22 mmol/L (ref 22–32)
Calcium: 9.2 mg/dL (ref 8.9–10.3)
Chloride: 97 mmol/L — ABNORMAL LOW (ref 98–111)
Creatinine, Ser: 0.82 mg/dL (ref 0.44–1.00)
GFR, Estimated: >60 mL/min
Glucose, Bld: 139 mg/dL — ABNORMAL HIGH (ref 70–99)
Potassium: 5.7 mmol/L — ABNORMAL HIGH (ref 3.5–5.1)
Sodium: 131 mmol/L — ABNORMAL LOW (ref 135–145)

## 2024-08-12 LAB — URINALYSIS, ROUTINE W REFLEX MICROSCOPIC
Bilirubin Urine: NEGATIVE
Glucose, UA: NEGATIVE mg/dL
Hgb urine dipstick: NEGATIVE
Ketones, ur: NEGATIVE mg/dL
Leukocytes,Ua: NEGATIVE
Nitrite: NEGATIVE
Protein, ur: NEGATIVE mg/dL
Specific Gravity, Urine: 1.027 (ref 1.005–1.030)
pH: 7 (ref 5.0–8.0)

## 2024-08-12 LAB — HEPATIC FUNCTION PANEL
ALT: 11 U/L (ref 0–44)
AST: 34 U/L (ref 15–41)
Albumin: 3.7 g/dL (ref 3.5–5.0)
Alkaline Phosphatase: 64 U/L (ref 38–126)
Bilirubin, Direct: <0.1 mg/dL (ref 0.0–0.2)
Total Bilirubin: 0.5 mg/dL (ref 0.0–1.2)
Total Protein: 6.9 g/dL (ref 6.5–8.1)

## 2024-08-12 LAB — CBC
HCT: 33.8 % — ABNORMAL LOW (ref 36.0–46.0)
Hemoglobin: 10.3 g/dL — ABNORMAL LOW (ref 12.0–15.0)
MCH: 24.2 pg — ABNORMAL LOW (ref 26.0–34.0)
MCHC: 30.5 g/dL (ref 30.0–36.0)
MCV: 79.5 fL — ABNORMAL LOW (ref 80.0–100.0)
Platelets: 195 K/uL (ref 150–400)
RBC: 4.25 MIL/uL (ref 3.87–5.11)
RDW: 15.2 % (ref 11.5–15.5)
WBC: 6.7 K/uL (ref 4.0–10.5)
nRBC: 0 % (ref 0.0–0.2)

## 2024-08-12 LAB — TROPONIN T, HIGH SENSITIVITY
Troponin T High Sensitivity: 20 ng/L — ABNORMAL HIGH (ref 0–19)
Troponin T High Sensitivity: 19 ng/L (ref 0–19)

## 2024-08-12 LAB — LIPASE, BLOOD: Lipase: 50 U/L (ref 11–51)

## 2024-08-12 MED ORDER — TROSPIUM CHLORIDE ER 60 MG PO CP24: 60.0000 mg | ORAL_CAPSULE | Freq: Every day | ORAL | Status: DC

## 2024-08-12 MED ORDER — VITAMIN D 25 MCG (1000 UNIT) PO TABS
2000.0000 [IU] | ORAL_TABLET | Freq: Every day | ORAL | Status: DC
  Administered 2024-08-13: 2000 [IU] via ORAL
  Filled 2024-08-12: qty 2

## 2024-08-12 MED ORDER — BRIMONIDINE TARTRATE 0.2 % OP SOLN
1.0000 [drp] | Freq: Two times a day (BID) | OPHTHALMIC | Status: DC
  Administered 2024-08-12 – 2024-08-14 (×4): 1 [drp] via OPHTHALMIC
  Filled 2024-08-12: qty 5

## 2024-08-12 MED ORDER — METOPROLOL SUCCINATE ER 50 MG PO TB24
100.0000 mg | ORAL_TABLET | Freq: Every day | ORAL | Status: DC
  Administered 2024-08-13 – 2024-08-14 (×2): 100 mg via ORAL
  Filled 2024-08-12 (×2): qty 2

## 2024-08-12 MED ORDER — AMLODIPINE BESYLATE 5 MG PO TABS
5.0000 mg | ORAL_TABLET | Freq: Every day | ORAL | Status: DC
  Administered 2024-08-12 – 2024-08-14 (×3): 5 mg via ORAL
  Filled 2024-08-12 (×3): qty 1

## 2024-08-12 MED ORDER — CLONAZEPAM 0.5 MG PO TABS: 0.5000 mg | ORAL_TABLET | Freq: Two times a day (BID) | ORAL | Status: DC | PRN

## 2024-08-12 MED ORDER — ACETAMINOPHEN 500 MG PO TABS
1000.0000 mg | ORAL_TABLET | Freq: Every day | ORAL | Status: DC | PRN
  Administered 2024-08-13: 1000 mg via ORAL
  Filled 2024-08-12: qty 2

## 2024-08-12 MED ORDER — HYDRALAZINE HCL 20 MG/ML IJ SOLN: 5.0000 mg | Freq: Four times a day (QID) | INTRAMUSCULAR | Status: DC | PRN

## 2024-08-12 MED ORDER — IOHEXOL 300 MG/ML  SOLN
100.0000 mL | Freq: Once | INTRAMUSCULAR | Status: AC | PRN
  Administered 2024-08-12: 100 mL via INTRAVENOUS

## 2024-08-12 MED ORDER — ONDANSETRON HCL 4 MG/2ML IJ SOLN
4.0000 mg | Freq: Once | INTRAMUSCULAR | Status: AC
  Administered 2024-08-12: 4 mg via INTRAVENOUS
  Filled 2024-08-12: qty 2

## 2024-08-12 MED ORDER — MELATONIN 5 MG PO TABS
10.0000 mg | ORAL_TABLET | Freq: Every day | ORAL | Status: DC
  Administered 2024-08-12 – 2024-08-13 (×2): 10 mg via ORAL
  Filled 2024-08-12 (×2): qty 2

## 2024-08-12 MED ORDER — BISACODYL 5 MG PO TBEC: 5.0000 mg | DELAYED_RELEASE_TABLET | Freq: Every day | ORAL | Status: DC | PRN

## 2024-08-12 MED ORDER — HEPARIN SODIUM (PORCINE) 5000 UNIT/ML IJ SOLN
5000.0000 [IU] | Freq: Three times a day (TID) | INTRAMUSCULAR | Status: DC
  Administered 2024-08-12 – 2024-08-14 (×4): 5000 [IU] via SUBCUTANEOUS
  Filled 2024-08-12 (×5): qty 1

## 2024-08-12 MED ORDER — FESOTERODINE FUMARATE ER 4 MG PO TB24
4.0000 mg | ORAL_TABLET | Freq: Every day | ORAL | Status: DC
  Administered 2024-08-12 – 2024-08-13 (×2): 4 mg via ORAL
  Filled 2024-08-12 (×2): qty 1

## 2024-08-12 MED ORDER — LIDOCAINE 5 % EX PTCH
1.0000 | MEDICATED_PATCH | CUTANEOUS | Status: DC
  Administered 2024-08-12 – 2024-08-13 (×2): 1 via TRANSDERMAL
  Filled 2024-08-12 (×2): qty 1

## 2024-08-12 MED ORDER — TAMSULOSIN HCL 0.4 MG PO CAPS
0.8000 mg | ORAL_CAPSULE | Freq: Every day | ORAL | Status: DC
  Administered 2024-08-12 – 2024-08-14 (×3): 0.8 mg via ORAL
  Filled 2024-08-12 (×3): qty 2

## 2024-08-12 MED ORDER — MORPHINE SULFATE (PF) 2 MG/ML IV SOLN
2.0000 mg | Freq: Once | INTRAVENOUS | Status: AC
  Administered 2024-08-12: 2 mg via INTRAVENOUS
  Filled 2024-08-12: qty 1

## 2024-08-12 MED ORDER — DICLOFENAC SODIUM 1 % EX GEL
2.0000 g | Freq: Four times a day (QID) | CUTANEOUS | Status: DC
  Administered 2024-08-12 – 2024-08-14 (×5): 2 g via TOPICAL
  Filled 2024-08-12: qty 100

## 2024-08-12 MED ORDER — ACETAMINOPHEN 325 MG PO TABS
650.0000 mg | ORAL_TABLET | Freq: Four times a day (QID) | ORAL | Status: DC | PRN
  Administered 2024-08-14: 650 mg via ORAL
  Filled 2024-08-12: qty 2

## 2024-08-12 MED ORDER — LATANOPROST 0.005 % OP SOLN
1.0000 [drp] | Freq: Every day | OPHTHALMIC | Status: DC
  Administered 2024-08-12 – 2024-08-13 (×2): 1 [drp] via OPHTHALMIC
  Filled 2024-08-12: qty 2.5

## 2024-08-12 MED ORDER — TRAMADOL HCL 50 MG PO TABS
50.0000 mg | ORAL_TABLET | Freq: Three times a day (TID) | ORAL | Status: DC
  Administered 2024-08-12 – 2024-08-14 (×5): 50 mg via ORAL
  Filled 2024-08-12 (×5): qty 1

## 2024-08-12 MED ORDER — TIMOLOL MALEATE 0.5 % OP SOLN
1.0000 [drp] | Freq: Two times a day (BID) | OPHTHALMIC | Status: DC
  Administered 2024-08-12 – 2024-08-14 (×4): 1 [drp] via OPHTHALMIC
  Filled 2024-08-12: qty 5

## 2024-08-12 MED ORDER — TRAZODONE HCL 50 MG PO TABS
50.0000 mg | ORAL_TABLET | Freq: Every day | ORAL | Status: DC
  Administered 2024-08-12 – 2024-08-13 (×2): 50 mg via ORAL
  Filled 2024-08-12 (×2): qty 1

## 2024-08-12 MED ORDER — PANTOPRAZOLE SODIUM 40 MG PO TBEC
40.0000 mg | DELAYED_RELEASE_TABLET | Freq: Every day | ORAL | Status: DC
  Administered 2024-08-13 – 2024-08-14 (×2): 40 mg via ORAL
  Filled 2024-08-12 (×2): qty 1

## 2024-08-12 MED ORDER — ACETAMINOPHEN 650 MG RE SUPP: 650.0000 mg | Freq: Four times a day (QID) | RECTAL | Status: DC | PRN

## 2024-08-12 MED ORDER — SODIUM ZIRCONIUM CYCLOSILICATE 10 G PO PACK
10.0000 g | PACK | Freq: Once | ORAL | Status: AC
  Administered 2024-08-12: 10 g via ORAL
  Filled 2024-08-12: qty 1

## 2024-08-12 MED ORDER — LEVOTHYROXINE SODIUM 112 MCG PO TABS
112.0000 ug | ORAL_TABLET | Freq: Every day | ORAL | Status: DC
  Administered 2024-08-13 – 2024-08-14 (×2): 112 ug via ORAL
  Filled 2024-08-12 (×2): qty 1

## 2024-08-12 MED ORDER — SODIUM CHLORIDE 0.9 % IV SOLN: INTRAVENOUS | Status: DC

## 2024-08-12 NOTE — Telephone Encounter (Signed)
 Spoke with Niels, suggested they take Teisha to urgent care or ED, due to no openings in the office today.

## 2024-08-12 NOTE — H&P (Signed)
 " History and Physical    Patient: Brittney Simpson FMW:[id number] DOB: August 22, 1927 DOA: 08/12/2024 DOS: the patient was seen and examined on 08/12/2024 PCP: [facility] Marsa PARAS, DO  Patient coming from: Home  Chief Complaint:  Chief Complaint  Patient presents with   Fall   HPI: Brittney Simpson is a 89 y.o. female with medical history significant of hearing impairment, hypertension, hyperlipidemia, chronic kidney disease, hypothyroidism, AAA, chronic pain syndrome secondary to chronic back pain.  At baseline, patient lives alone.  She is accompanied by her daughter who was at bedside.  Patient had been in her usual state of health but reportedly had a fall at home last night.  She is reported to have had multiple falls at home over the past 24 hours.  She was unable to state how and why she fell.  Denied loss of consciousness.  Admitted to having hit her head.  She denies any chest pain or shortness of breath.  Denies any visual changes.  Denies any recent change in medications.  In the ED, CT scan of the head showed no acute abnormality.  CT neck showed no evidence of acute traumatic injury.  CT showed multiple left-sided rib fractures.   Review of Systems: As mentioned in the history of present illness. All other systems reviewed and are negative. Past Medical History:  Diagnosis Date   Anxiety    Arthritis    Back ache    Hiatal hernia    Hyperlipidemia    Hypertension    Hypothyroid    Osteopenia    Thyroid  disease    UTI (urinary tract infection)    Past Surgical History:  Procedure Laterality Date   CHOLECYSTECTOMY     HEMORRHOID SURGERY     HERNIA REPAIR     HERNIA REPAIR     KYPHOPLASTY N/A 01/23/2017   Procedure: [id number];  Surgeon: Kathlynn Sharper, MD;  Location: [facility] ORS;  Service: Orthopedics;  Laterality: N/A;   KYPHOPLASTY N/A 01/25/2020   Procedure: L4 KYPHOPLASTY;  Surgeon: Kathlynn Sharper, MD;  Location: [facility] ORS;  Service: Orthopedics;  Laterality:  N/A;   Social History:  reports that she has never smoked. She has never used smokeless tobacco. She reports that she does not drink alcohol and does not use drugs.  Allergies[1]  Family History  Problem Relation Age of Onset   Stroke Mother    Cancer Father     Prior to Admission medications  Medication Sig Start Date End Date Taking? Authorizing Provider  acetaminophen  (TYLENOL ) 500 MG tablet Take 1,000 mg by mouth daily as needed for moderate pain or headache.   Yes [provider]  alendronate  (FOSAMAX ) 70 MG tablet Take 1 tablet (70 mg total) by mouth every 7 (seven) days. Take with a full glass of water on an empty stomach. 03/10/24  Yes Karamalegos, Marsa PARAS, DO  amLODipine  (NORVASC ) 5 MG tablet Take 1 tablet (5 mg total) by mouth daily. 03/03/24  Yes Karamalegos, Marsa PARAS, DO  brimonidine  (ALPHAGAN ) 0.2 % ophthalmic solution Place 1 drop into the left eye 2 (two) times daily. 01/30/22  Yes [provider]  Cholecalciferol  (VITAMIN D3) 50 MCG (2000 UT) TABS Take 2,000 Units by mouth daily with lunch.   Yes [provider]  clonazePAM  (KLONOPIN ) 0.5 MG tablet TAKE 1/2 TABLET BY MOUTH TWICE DAILY 07/08/24  Yes Karamalegos, Marsa PARAS, DO  Cranberry 500 MG CAPS Take 1,000 mg by mouth daily with supper.   Yes [provider]  furosemide  (LASIX ) 20 MG tablet TAKE 1 and 1/2 TABLETS BY MOUTH ONCE DAILY 07/21/24  Yes Karamalegos, Marsa PARAS, DO  latanoprost  (XALATAN ) 0.005 % ophthalmic solution Place 1 drop into both eyes at bedtime.  09/22/15  Yes [provider]  levothyroxine  (SYNTHROID ) 112 MCG tablet TAKE 1 TABLET BY MOUTH ONCE DAILY ON AN EMPTY STOMACH. WAIT 30 MINUTES BEFORE TAKING OTHER MEDS. 06/03/24  Yes Karamalegos, Marsa PARAS, DO  lisinopril  (ZESTRIL ) 40 MG tablet Take 1 tablet (40 mg total) by mouth daily. 03/03/24  Yes Karamalegos, Marsa PARAS, DO  Melatonin 10 MG TABS Take 10 mg by mouth at bedtime.   Yes [provider]   metoprolol  succinate (TOPROL -XL) 100 MG 24 hr tablet Take 1 tablet (100 mg total) by mouth daily. with food 03/03/24  Yes Karamalegos, Marsa PARAS, DO  omeprazole  (PRILOSEC) 20 MG capsule Take 1 capsule (20 mg total) by mouth daily. OFFICE VISIT NEEDED FOR ADDITIONAL REFILLS 07/29/24  Yes Karamalegos, Marsa PARAS, DO  tamsulosin  (FLOMAX ) 0.4 MG CAPS capsule Take 2 capsules (0.8 mg total) by mouth daily. 05/31/24  Yes MacDiarmid, Glendia, MD  timolol  (TIMOPTIC ) 0.5 % ophthalmic solution Place 1 drop into both eyes 2 (two) times daily.   Yes [provider]  traMADol  (ULTRAM ) 50 MG tablet Take 1 tablet (50 mg total) by mouth 3 (three) times daily. Each refill must last 30 days. 04/19/24 10/16/24 Yes Tanya Glisson, MD  traZODone  (DESYREL ) 50 MG tablet TAKE 1 TABLET BY MOUTH AT BEDTIME 07/21/24  Yes Karamalegos, Marsa PARAS, DO  Trospium  Chloride 60 MG CP24 Take 1 capsule (60 mg total) by mouth daily. 07/26/24  Yes MacDiarmid, Glendia, MD  triamcinolone  ointment (KENALOG ) 0.1 % Apply 1 gram twice daily to affected areas of skin. Stop once resolved and restart as needed for flares. Avoid use on face, armpits, groin unless otherwise indicated. Patient not taking: Reported on 07/26/2024 05/19/24   Raymund Lauraine BROCKS, MD    Physical Exam: Vitals:   08/12/24 1223 08/12/24 1542 08/12/24 1610 08/12/24 2015  BP: 128/74  122/84 (!) 161/80  Pulse: 61  92 69  Resp: 18  16 18   Temp: 98 F (36.7 C)  (!) 97.5 F (36.4 C) 97.8 F (36.6 C)  TempSrc: Oral  Oral   SpO2: 97% 96% 96% 94%   General: Patient is an elderly frail female.  She is hard of hearing.  She is however alert oriented.  Family at bedside. HEENT: Conjunctiva clear.  Oral mucosa moist. Neck: Supple no JVD. Chest: Mild tenderness in the left side.  Diminished breath sounds bilaterally.  No added sounds appreciated at this time. Cardiovascular: Regular S1-S2 with no appreciable murmur  CNS shows no obvious focal deficit. Skin negative for any  rash  Data Reviewed: WBC 6.7, hemoglobin 10.3, hematocrit 34, MCV 87.5, platelet count 195,  Sodium is 131, potassium 5.7, chloride is 97, bicarb is 22, glucose is 139, creatinine is 14, creatinine 0.82, calcium is 9.2 AST is 34, ALT 11, total protein 6.9.  Troponin is 20 and 19 respectively  Urinalysis were unremarkable for any infection.  CT scan: 1. Minimally displaced left fourth through tenth rib fractures. 2. Trace left pleural effusion and left lower lobe atelectasis. No pneumothorax. 3. No acute intra-abdominal or intrapelvic trauma. 4. Stable bilobed infrarenal abdominal aortic aneurysm measuring up to 5.4 cm. Recommend CTA or MRA, as appropriate, in 6 months and referral to a vascular specialist. Reference: Journal of Vascular Surgery 67.1 (2018): 2-77. J Am Coll  Radiol 2013;10:789-794. 5. Mild bilateral hydronephrosis and hydroureter, likely due to marked distension of the urinary bladder. 6. Moderate hiatal hernia. 7. Cardiomegaly. 8. Aortic Atherosclerosis (ICD10-I70.0). Coronary artery atherosclerosis.   Assessment and Plan: 89 year old admitted on account of multiple falls at home and found to have multiple rib fractures with trace left effusion and left lower lobe atelectasis.  # Multiple displaced closed left rib fractures from 4th-10th ribs.  Secondary to falls.-Optimize pain management.  Incentive spirometry has been ordered to prevent further worsening atelectasis.  #2.  Trace left effusion and left lower lobe atelectasis most likely due to rib fractures. - No indication for thoracentesis at this time.  As needed  incentive spirometry  #3.  Multiple electrolyte abnormalities including hyponatremia and hyperkalemia. - IV fluids with normal saline has been offered to help with repletion of sodium.  Hyperkalemia will be treated with 1 dose of Lokelma .  No evidence of EKG changes at this time.  Follow-up with serum potassium in a.m.  #4.  Incidental finding of  bilobed infrarenal abdominal aortic aneurysm measuring up to 5.4 cm. Chronic. Follow-up with vascular specialist as outpatient.  Considering patient's age, it is unlikely surgical interventions may be pursued.  #Urinary retention with bilateral hydronephrosis without evidence of acute kidney injury.  Foley catheter was placed.  Urinalysis came back negative.  Patient is already on Flomax  and will continue with same.  Keep Foley in situ at this time.  Urology input may be warranted if patient fails trial of voiding.  #6.  Chronic back pain secondary to chronic DJD.  At baseline, patient is on tramadol .  Unclear if current dosing with associated benzodiazepine may be contribute to increased risk of falls.  Will consider topical agents.  #7.  Patient lives alone at home.  Case management to review living conditions and advise on safe disposition.  CODE STATUS DNR/DNI.   Advance Care Planning: CODE STATUS DNR/DNI as per daughter.  Consults:   Family Communication: Discussed plan of care with daughter who was at bedside.  Severity of Illness: The appropriate patient status for this patient is INPATIENT. Inpatient status is judged to be reasonable and necessary in order to provide the required intensity of service to ensure the patient's safety. [name] presenting symptoms, physical exam findings, and initial radiographic and laboratory data in the context of their chronic comorbidities is felt to place them at high risk for further clinical deterioration. Furthermore, it is not anticipated that the patient will be medically stable for discharge from the hospital within 2 midnights of admission.   * I certify that at the point of admission it is my clinical judgment that the patient will require inpatient hospital care spanning beyond 2 midnights from the point of admission due to high intensity of service, high risk for further deterioration and high frequency of surveillance  required.*  Author: Maude MARLA Dart, MD 08/12/2024 8:20 PM  For on call review [email or web address].     [1] No Known Allergies  "

## 2024-08-12 NOTE — ED Triage Notes (Signed)
 Pt to ED via POV from [facility]. Pt had 2 unwitnessed falls yesterday and another unwitnessed fall today. Pt reports leg pain,. Shoulder pain and back pain. Unsure of LOC

## 2024-08-12 NOTE — ED Provider Notes (Signed)
 "  [facility] Provider Note    Event Date/Time   First MD Initiated Contact with Patient 08/12/24 1527     (approximate)   History   Chief Complaint Fall   HPI  Brittney Simpson is a 89 y.o. female with past medical history of hypertension, hyperlipidemia, CKD, hypothyroidism, AAA, and chronic pain syndrome who presents to the ED complaining of fall.  Daughter at bedside reports that patient has fallen 3 times in the past 24 hours at home, where she lives by herself.  Patient states she is not sure what caused her to fall, does not think she hit her head or lost consciousness.  She does complain of significant pain over the left side of her chest wall and has had difficulty walking today due to the pain.  She states that it radiates down into her left leg but she denies any pain in her abdomen and does not take a blood thinner.     Physical Exam   Triage Vital Signs: ED Triage Vitals [08/12/24 1223]  Encounter Vitals Group     BP 128/74     Girls Systolic BP Percentile      Girls Diastolic BP Percentile      Boys Systolic BP Percentile      Boys Diastolic BP Percentile      Pulse Rate 61     Resp 18     Temp 98 F (36.7 C)     Temp Source Oral     SpO2 97 %     Weight      Height      Head Circumference      Peak Flow      Pain Score 7     Pain Loc      Pain Education      Exclude from Growth Chart     Most recent vital signs: Vitals:   08/12/24 1542 08/12/24 1610  BP:  122/84  Pulse:  92  Resp:  16  Temp:  (!) 97.5 F (36.4 C)  SpO2: 96% 96%    Constitutional: Alert and oriented. Eyes: Conjunctivae are normal. Head: Atraumatic. Nose: No congestion/rhinnorhea. Mouth/Throat: Mucous membranes are moist.  Neck: No midline cervical spine tenderness to palpation. Cardiovascular: Normal rate, regular rhythm. Grossly normal heart sounds.  2+ radial pulses bilaterally. Respiratory: Normal respiratory effort.  No retractions. Lungs  CTAB.  Exquisite left chest wall tenderness to palpation noted. Gastrointestinal: Soft and nontender. No distention. Musculoskeletal: No lower extremity tenderness nor edema.  No upper extremity bony tenderness. Neurologic:  Normal speech and language. No gross focal neurologic deficits are appreciated.    ED Results / Procedures / Treatments   Labs (all labs ordered are listed, but only abnormal results are displayed) Labs Reviewed  BASIC METABOLIC PANEL WITH GFR - Abnormal; Notable for the following components:      Result Value   Sodium 131 (*)    Potassium 5.7 (*)    Chloride 97 (*)    Glucose, Bld 139 (*)    All other components within normal limits  CBC - Abnormal; Notable for the following components:   Hemoglobin 10.3 (*)    HCT 33.8 (*)    MCV 79.5 (*)    MCH 24.2 (*)    All other components within normal limits  TROPONIN T, HIGH SENSITIVITY - Abnormal; Notable for the following components:   Troponin T High Sensitivity 20 (*)    All other components within normal  limits  HEPATIC FUNCTION PANEL  LIPASE, BLOOD  URINALYSIS, ROUTINE W REFLEX MICROSCOPIC  TROPONIN T, HIGH SENSITIVITY     EKG  ED ECG REPORT I, Carlin Palin, the attending physician, personally viewed and interpreted this ECG.   Date: 08/12/2024  EKG Time: 12:25  Rate: 65  Rhythm: normal sinus rhythm, PACs  Axis: Normal  Intervals:none  ST&T Change: None  RADIOLOGY CT head reviewed and interpreted by me with no hemorrhage or midline shift.  PROCEDURES:  Critical Care performed: No  Procedures   MEDICATIONS ORDERED IN ED: Medications  morphine  (PF) 2 MG/ML injection 2 mg (2 mg Intravenous Given 08/12/24 1606)  ondansetron  (ZOFRAN ) injection 4 mg (4 mg Intravenous Given 08/12/24 1606)  iohexol  (OMNIPAQUE ) 300 MG/ML solution 100 mL (100 mLs Intravenous Contrast Given 08/12/24 1720)     IMPRESSION / MDM / ASSESSMENT AND PLAN / ED COURSE  I reviewed the triage vital signs and the nursing  notes.                              89 y.o. female with past medical history of hypertension, hyperlipidemia, hypothyroidism, CKD, AAA, and chronic pain syndrome who presents to the ED complaining of multiple falls in the past 24 hours with left chest wall pain.  Patient's presentation is most consistent with acute presentation with potential threat to life or bodily function.  Differential diagnosis includes, but is not limited to, rib fracture, pneumothorax, hemothorax, intracranial injury, cervical spine injury, arrhythmia, anemia, electrolyte abnormality, AKI.  Patient uncomfortable but nontoxic-appearing and in no acute distress, vital signs are unremarkable.  CT head and cervical spine are negative for acute finding but chest x-ray shows multiple rib fractures on the left side, including some ribs that appear to be fractured in multiple places.  We will further assess with CT imaging of her chest/abdomen/pelvis and treat symptomatically with IV morphine  and Zofran .  Labs without significant anemia or leukocytosis, mild hyperkalemia noted but hemolysis also reported on her labs and there is no significant AKI.  She will likely require admission to the hospital for pain control.  CT imaging shows fractures of ribs 4 through 10 on the left side, which are broken in a single location without flail segment.  Small effusion noted but no evidence of pneumothorax and no intra-abdominal injuries noted.  Patient does have markedly distended bladder with hydroureter and hydronephrosis without clear obstruction, will place Foley catheter.  LFTs and lipase are unremarkable, pain improved following IV morphine .  Case discussed with hospitalist for admission.      FINAL CLINICAL IMPRESSION(S) / ED DIAGNOSES   Final diagnoses:  Fall, initial encounter  Closed fracture of multiple ribs of left side, initial encounter  Urinary retention     Rx / DC Orders   ED Discharge Orders     None         Note:  This document was prepared using Dragon voice recognition software and may include unintentional dictation errors.   Palin Carlin, MD 08/12/24 1841  "

## 2024-08-12 NOTE — Telephone Encounter (Signed)
 Per daughter of pt Niels, the pt fell while getting out of the shower yesterday and now c/o rib and hip soreness. Niels is not physically with the pt and was unable to answer all of NT questions. This RN offered to call the pt but Niels did not provide the landline number. Niels reported the pt took a tramadol  for pain and is not ambulatory this morning. With such limited information, Niels was advised to take the pt to the ED. Niels refused stating the ER has so much going around, it's not safe. ER disposition was reiterated.    FYI Only or Action Required?: FYI only for provider: ED advised.  Patient was last seen in primary care on 03/03/2024 by Edman Marsa PARAS, DO.  Called Nurse Triage reporting Fall.  Symptoms began yesterday.  Interventions attempted: Prescription medications: tramadol .  Symptoms are: rapidly worsening.  Triage Disposition: Go to ED Now (Notify PCP)  Patient/caregiver understands and will follow disposition?: No, wishes to speak with PCP   Copied from CRM [id number]. Topic: Clinical - Red Word Triage >> Aug 12, 2024  8:06 AM Treva T wrote: Red Word that prompted transfer to Nurse Triage: Daughter, Terry, calling reports pt fell on yesterday, per pt reports soreness and ribs hurt/pain, and increased body soreness from fall.   Requesting an appt for evaluation Reason for Disposition  Injury (or injuries) that need emergency care  Answer Assessment - Initial Assessment Questions 1. MECHANISM: How did the fall happen?     Clemens getting out of the shower per daughter Terry  3. ONSET: When did the fall happen? (e.g., minutes, hours, or days ago)     Yesterday   5. INJURY: Did you hurt (injure) yourself when you fell? If Yes, ask: What did you injure? Tell me more about this? (e.g., body area; type of injury; pain severity)     Yes, don't put this in the notes, I could be wrong, but she hurt her, I think the right hip and the ribs. Daughter Niels  states she said she can't walk or go to her hair appt.    6. PAIN: Is there any pain? If Yes, ask: How bad is the pain? (e.g., Scale 0-10; or none, mild,      Yes, daughter of pt reports she took tramadol  and a couple of tylenol     9. OTHER SYMPTOMS: Do you have any other symptoms? (e.g., dizziness, fever, weakness; new-onset or worsening).      Denies  Protocols used: Falls and [facility]

## 2024-08-13 DIAGNOSIS — S2242XA Multiple fractures of ribs, left side, initial encounter for closed fracture: Secondary | ICD-10-CM | POA: Diagnosis not present

## 2024-08-13 LAB — BASIC METABOLIC PANEL WITH GFR
Anion gap: 7 (ref 5–15)
BUN: 10 mg/dL (ref 8–23)
CO2: 28 mmol/L (ref 22–32)
Calcium: 8.8 mg/dL — ABNORMAL LOW (ref 8.9–10.3)
Chloride: 99 mmol/L (ref 98–111)
Creatinine, Ser: 0.66 mg/dL (ref 0.44–1.00)
GFR, Estimated: >60 mL/min
Glucose, Bld: 112 mg/dL — ABNORMAL HIGH (ref 70–99)
Potassium: 3.6 mmol/L (ref 3.5–5.1)
Sodium: 134 mmol/L — ABNORMAL LOW (ref 135–145)

## 2024-08-13 LAB — CBC
HCT: 32.7 % — ABNORMAL LOW (ref 36.0–46.0)
Hemoglobin: 9.7 g/dL — ABNORMAL LOW (ref 12.0–15.0)
MCH: 24.3 pg — ABNORMAL LOW (ref 26.0–34.0)
MCHC: 29.7 g/dL — ABNORMAL LOW (ref 30.0–36.0)
MCV: 82 fL (ref 80.0–100.0)
Platelets: 175 K/uL (ref 150–400)
RBC: 3.99 MIL/uL (ref 3.87–5.11)
RDW: 15.3 % (ref 11.5–15.5)
WBC: 4.6 K/uL (ref 4.0–10.5)
nRBC: 0 % (ref 0.0–0.2)

## 2024-08-13 MED ORDER — OXYCODONE HCL 5 MG PO TABS
5.0000 mg | ORAL_TABLET | Freq: Four times a day (QID) | ORAL | Status: DC | PRN
  Administered 2024-08-13 – 2024-08-14 (×2): 5 mg via ORAL
  Filled 2024-08-13 (×2): qty 1

## 2024-08-13 MED ORDER — SODIUM CHLORIDE 0.9 % IV BOLUS
500.0000 mL | Freq: Once | INTRAVENOUS | Status: AC
  Administered 2024-08-13: 500 mL via INTRAVENOUS

## 2024-08-13 MED ORDER — POLYETHYLENE GLYCOL 3350 17 G PO PACK
17.0000 g | PACK | Freq: Every day | ORAL | Status: DC
  Administered 2024-08-13 – 2024-08-14 (×2): 17 g via ORAL
  Filled 2024-08-13 (×2): qty 1

## 2024-08-13 NOTE — Plan of Care (Signed)

## 2024-08-13 NOTE — Evaluation (Signed)
 Physical Therapy Evaluation Patient Details Name: Brittney Simpson MRN: [id number] DOB: 08-09-1927 Today's Date: 08/13/2024  History of Present Illness  Brittney Simpson is a 89 y.o. female with past medical history of hypertension, hyperlipidemia, CKD, hypothyroidism, AAA, and chronic pain syndrome who presents to the ED complaining of fall. Patient fund to have multiple rib fractures  4-10.  Clinical Impression  Patient received in bed, she is very HOH. Agrees to PT assessment. No family present at evaluation. Patient is mod I with bed mobility. Pain reported once sitting edge of bed on left side. She is able to stand without AD, however is unsteady and weak. Therefore re-attempted standing/walking with RW. Patient demonstrates improved balance with RW and is able to ambulate ~60 feet with walker and cga. Requires min A for obstacle avoidance, as she tends to run into door frames with wheels of walker. Patient will continue to benefit from skilled PT to improve safety with mobility.          If plan is discharge home, recommend the following: A little help with walking and/or transfers;A little help with bathing/dressing/bathroom;Assist for transportation;Help with stairs or ramp for entrance;Assistance with cooking/housework. Recommend 24/7 supervision for safety if returning home.    Can travel by private vehicle    yes    Equipment Recommendations BSC/3in1  Recommendations for Other Services       Functional Status Assessment Patient has had a recent decline in their functional status and demonstrates the ability to make significant improvements in function in a reasonable and predictable amount of time.     Precautions / Restrictions Precautions Precautions: Fall Recall of Precautions/Restrictions: Impaired Restrictions Weight Bearing Restrictions Per Provider Order: No      Mobility  Bed Mobility Overal bed mobility: Modified Independent             General bed  mobility comments: pain when seated on side of bed    Transfers Overall transfer level: Needs assistance Equipment used: None, Rolling walker (2 wheels) Transfers: Sit to/from Stand Sit to Stand: Min assist           General transfer comment: min A without AD, unsteady. Cga with RW    Ambulation/Gait Ambulation/Gait assistance: Contact guard assist Gait Distance (Feet): 60 Feet Assistive device: Rolling walker (2 wheels) Gait Pattern/deviations: Step-through pattern Gait velocity: WFL     General Gait Details: patient requires min A to avoid obstacles when using RW. Tends to run into door frames with wheels of walker. Otherwise cga for safety  Stairs            Wheelchair Mobility     Tilt Bed    Modified Rankin (Stroke Patients Only)       Balance Overall balance assessment: Needs assistance Sitting-balance support: Feet supported Sitting balance-Leahy Scale: Good     Standing balance support: Bilateral upper extremity supported, During functional activity, Reliant on assistive device for balance Standing balance-Leahy Scale: Fair Standing balance comment: needs B UE support at this time for balance and safety                             Pertinent Vitals/Pain Pain Assessment Pain Assessment: Faces Faces Pain Scale: Hurts little more Pain Location: L side ( ribs) Pain Descriptors / Indicators: Discomfort, Grimacing, Guarding Pain Intervention(s): Monitored during session, Repositioned    Home Living Family/patient expects to be discharged to:: Private residence Living Arrangements: Alone Available Help at  Discharge: Family;Available PRN/intermittently Type of Home: House Home Access: Stairs to enter         Home Equipment: Agricultural [employer]ant (2 wheels);Cane - single point      Prior Function Prior Level of Function : Independent/Modified Independent;History of Falls (last six months)             Mobility Comments: patient  states she uses cane mostly, uses RW when out in community ADLs Comments: independent     Extremity/Trunk Assessment   Upper Extremity Assessment Upper Extremity Assessment: Overall WFL for tasks assessed    Lower Extremity Assessment Lower Extremity Assessment: Generalized weakness    Cervical / Trunk Assessment Cervical / Trunk Assessment:  (rib fractures)  Communication   Communication Communication: Impaired Factors Affecting Communication: Hearing impaired    Cognition Arousal: Alert Behavior During Therapy: WFL for tasks assessed/performed   PT - Cognitive impairments: Difficult to assess, No family/caregiver present to determine baseline Difficult to assess due to: Hard of hearing/deaf                       Following commands: Impaired Following commands impaired: Follows one step commands inconsistently     Cueing Cueing Techniques: Verbal cues, Gestural cues, Tactile cues     General Comments      Exercises     Assessment/Plan    PT Assessment Patient needs continued PT services  PT Problem List Decreased strength;Decreased activity tolerance;Decreased balance;Decreased mobility;Decreased cognition;Decreased safety awareness;Decreased knowledge of use of DME;Pain       PT Treatment Interventions DME instruction;Gait training;Stair training;Functional mobility training;Therapeutic activities;Therapeutic exercise;Balance training;Cognitive remediation;Patient/family education    PT Goals (Current goals can be found in the Care Plan section)  Acute Rehab PT Goals Patient Stated Goal: return home PT Goal Formulation: With patient Time For Goal Achievement: 08/20/24 Potential to Achieve Goals: Good    Frequency Min 2X/week     Co-evaluation               AM-PAC PT 6 Clicks Mobility  Outcome Measure Help needed turning from your back to your side while in a flat bed without using bedrails?: A Little Help needed moving from lying on  your back to sitting on the side of a flat bed without using bedrails?: A Little Help needed moving to and from a bed to a chair (including a wheelchair)?: A Little Help needed standing up from a chair using your arms (e.g., wheelchair or bedside chair)?: A Little Help needed to walk in hospital room?: A Little Help needed climbing 3-5 steps with a railing? : A Little 6 Click Score: 18    End of Session Equipment Utilized During Treatment: Gait belt Activity Tolerance: Patient tolerated treatment well Patient left: in chair;with call bell/phone within reach;with chair alarm set Nurse Communication: Mobility status PT Visit Diagnosis: Other abnormalities of gait and mobility (R26.89);Muscle weakness (generalized) (M62.81);Repeated falls (R29.6);Unsteadiness on feet (R26.81);Pain Pain - Right/Left: Left Pain - part of body:  (ribs)    Time: 9062-9044 PT Time Calculation (min) (ACUTE ONLY): 18 min   Charges:   PT Evaluation $PT Eval Low Complexity: 1 Low PT Treatments $Gait Training: 8-22 mins PT General Charges $$ ACUTE PT VISIT: 1 Visit         Oakley Kossman, PT, GCS 08/13/24,10:06 AM

## 2024-08-13 NOTE — Care Management CC44 (Signed)
"         Condition Code 44 Documentation Completed  Patient Details  Name: Brittney Simpson MRN: [id number] Date of Birth: 11-28-1927   Condition Code 44 given:  Yes Patient signature on Condition Code 44 notice:  Yes Documentation of 2 MD's agreement:  Yes Code 44 added to claim:  Yes    Trong Gosling, LCSW 08/13/2024, 10:06 AM  "

## 2024-08-13 NOTE — Progress Notes (Signed)
 " PROGRESS NOTE    Brittney Simpson  FMW:[id number] DOB: 1928/02/19 DOA: 08/12/2024 PCP: Edman Marsa PARAS, DO     Brief Narrative:   Brittney Simpson is a 89 y.o. female with medical history significant of hearing impairment, hypertension, hyperlipidemia, chronic kidney disease, hypothyroidism, AAA, chronic pain syndrome secondary to chronic back pain.  At baseline, patient lives alone.  She is accompanied by her daughter who was at bedside.  Patient had been in her usual state of health but reportedly had a fall at home last night.  She is reported to have had multiple falls at home over the past 24 hours.  She was unable to state how and why she fell.  Denied loss of consciousness.  Admitted to having hit her head.  She denies any chest pain or shortness of breath.  Denies any visual changes.  Denies any recent change in medications.   In the ED, CT scan of the head showed no acute abnormality.  CT neck showed no evidence of acute traumatic injury.  CT showed multiple left-sided rib fractures.   Assessment & Plan:   Principal Problem:   Multiple rib fractures Active Problems:   Chronic pain syndrome   History of vertebral compression fracture (T7, T12, L4)   Hypothyroidism   Osteoporosis   CKD stage 3a, GFR 45-59 ml/min (HCC)   Hypertension  # Rib fractures After slip and fall, left 4-10, minimally displaced, CT no complications. Room air. No other apparent injury. - IS - monitor for pneumonia - pain control - pt/ot advising home with home health  # Acute urinary retention Recent toviaz  likely contributory. Foley placed in ER - d/c foley, monitor, I/o cath prn - hold toviaz  - continue home flomax   # HTN Controlled - home amlodipine , metop  # GAD - home klonopin   # Hypothyroid - home levothyroxine   # Glaucoma  - home meds   DVT prophylaxis: lovenox Code Status: dnr Family Communication: daughters at bedside requests daughter carol be updated  tomorrow  Level of care: Telemetry Status is: Observation    Consultants:  none  Procedures: none  Antimicrobials:  none    Subjective: Reports left rib pain with inspiration otherwise no complaints  Objective: Vitals:   08/12/24 2028 08/13/24 0425 08/13/24 0750 08/13/24 1611  BP: (!) 161/80 121/73 130/78 109/74  Pulse: 69 63 62 72  Resp: 18 18 17 17   Temp: 97.8 F (36.6 C) 97.9 F (36.6 C) 98 F (36.7 C) 98.3 F (36.8 C)  TempSrc: Oral Oral    SpO2:  90% 95% 95%  Weight: 51.2 kg     Height: 5' 3 (1.6 m)       Intake/Output Summary (Last 24 hours) at 08/13/2024 1623 Last data filed at 08/13/2024 0432 Gross per 24 hour  Intake --  Output 1750 ml  Net -1750 ml   Filed Weights   08/12/24 2028  Weight: 51.2 kg    Examination:  General exam: Appears calm and comfortable  Respiratory system: Clear to auscultation. Respiratory effort normal. Cardiovascular system: S1 & S2 heard, RRR.   Gastrointestinal system: Abdomen is nondistended, soft and nontender. No organomegaly or masses felt. Normal bowel sounds heard. Central nervous system: Alert and oriented. No focal neurological deficits. Hard of hearing. Extremities: Symmetric 5 x 5 power. Skin: No rashes, lesions or ulcers Psychiatry: Judgement and insight appear normal. Mood & affect appropriate.     Data Reviewed: I have personally reviewed following labs and imaging studies  CBC:  Recent Labs  Lab 08/12/24 1347 08/13/24 0630  WBC 6.7 4.6  HGB 10.3* 9.7*  HCT 33.8* 32.7*  MCV 79.5* 82.0  PLT 195 175   Basic Metabolic Panel: Recent Labs  Lab 08/12/24 1226 08/13/24 0630  NA 131* 134*  K 5.7* 3.6  CL 97* 99  CO2 22 28  GLUCOSE 139* 112*  BUN 14 10  CREATININE 0.82 0.66  CALCIUM 9.2 8.8*   GFR: Estimated Creatinine Clearance: 34.8 mL/min (by C-G formula based on SCr of 0.66 mg/dL). Liver Function Tests: Recent Labs  Lab 08/12/24 1226  AST 34  ALT 11  ALKPHOS 64  BILITOT 0.5  PROT  6.9  ALBUMIN 3.7   Recent Labs  Lab 08/12/24 1226  LIPASE 50   No results for input(s): AMMONIA in the last 168 hours. Coagulation Profile: No results for input(s): INR, PROTIME in the last 168 hours. Cardiac Enzymes: No results for input(s): CKTOTAL, CKMB, CKMBINDEX, TROPONINI in the last 168 hours. BNP (last 3 results) No results for input(s): PROBNP in the last 8760 hours. HbA1C: No results for input(s): HGBA1C in the last 72 hours. CBG: No results for input(s): GLUCAP in the last 168 hours. Lipid Profile: No results for input(s): CHOL, HDL, LDLCALC, TRIG, CHOLHDL, LDLDIRECT in the last 72 hours. Thyroid  Function Tests: No results for input(s): TSH, T4TOTAL, FREET4, T3FREE, THYROIDAB in the last 72 hours. Anemia Panel: No results for input(s): VITAMINB12, FOLATE, FERRITIN, TIBC, IRON, RETICCTPCT in the last 72 hours. Urine analysis:    Component Value Date/Time   COLORURINE STRAW (A) 08/12/2024 1852   APPEARANCEUR CLEAR (A) 08/12/2024 1852   APPEARANCEUR Cloudy (A) 07/26/2024 1001   LABSPEC 1.027 08/12/2024 1852   LABSPEC 1.003 07/08/2014 0907   PHURINE 7.0 08/12/2024 1852   GLUCOSEU NEGATIVE 08/12/2024 1852   GLUCOSEU Negative 07/08/2014 0907   HGBUR NEGATIVE 08/12/2024 1852   BILIRUBINUR NEGATIVE 08/12/2024 1852   BILIRUBINUR Negative 07/26/2024 1001   BILIRUBINUR Negative 07/08/2014 0907   KETONESUR NEGATIVE 08/12/2024 1852   PROTEINUR NEGATIVE 08/12/2024 1852   NITRITE NEGATIVE 08/12/2024 1852   LEUKOCYTESUR NEGATIVE 08/12/2024 1852   LEUKOCYTESUR Negative 07/08/2014 0907   Sepsis Labs: @LABRCNTIP (procalcitonin:4,lacticidven:4)  )No results found for this or any previous visit (from the past 240 hours).       Radiology Studies: CT CHEST ABDOMEN PELVIS W CONTRAST Result Date: 08/12/2024 CLINICAL DATA:  Multiple unwitnessed falls yesterday and today, leg pain, shoulder pain, back pain EXAM: CT CHEST,  ABDOMEN, AND PELVIS WITH CONTRAST TECHNIQUE: Multidetector CT imaging of the chest, abdomen and pelvis was performed following the standard protocol during bolus administration of intravenous contrast. RADIATION DOSE REDUCTION: This exam was performed according to the departmental dose-optimization program which includes automated exposure control, adjustment of the mA and/or kV according to patient size and/or use of iterative reconstruction technique. CONTRAST:  OMNIPAQUE  IOHEXOL  300 MG/ML  SOLN COMPARISON:  06/16/2024, 08/12/2024 FINDINGS: CT CHEST FINDINGS Cardiovascular: The heart is enlarged without pericardial effusion. Marked ectasia of the thoracic aorta. No evidence of thoracic aortic aneurysm or dissection. No evidence of vascular injury. Atherosclerosis of the aorta and coronary vasculature. Mediastinum/Nodes: No enlarged mediastinal, hilar, or axillary lymph nodes. Thyroid  gland, trachea, and esophagus demonstrate no significant findings. Moderate hiatal hernia. Lungs/Pleura: Chronic elevation of the left hemidiaphragm. Trace left pleural effusion. Dependent left lower lobe atelectasis. No airspace disease or pneumothorax. Central airways are patent. Musculoskeletal: There are minimally displaced left anterolateral fourth through tenth rib fractures. Chronic T7 and T12 compression  deformities with prior vertebral augmentations. Reconstructed images demonstrate no additional findings. CT ABDOMEN PELVIS FINDINGS Hepatobiliary: No focal liver abnormality is seen. Status post cholecystectomy. No biliary dilatation. Pancreas: Unremarkable. No pancreatic ductal dilatation or surrounding inflammatory changes. Spleen: Scattered indeterminate hypodensities within the spleen, indeterminate but likely representing cysts or hemangiomas. No evidence of splenomegaly or acute injury. Adrenals/Urinary Tract: Bilateral renal cortical atrophy. Mild bilateral hydronephrosis and hydroureter likely due to markedly  distended urinary bladder. No urinary tract calculi. The adrenals are unremarkable. Stomach/Bowel: No bowel obstruction or ileus. Normal appendix right lower quadrant. No bowel wall thickening or inflammatory change. Moderate hiatal hernia. Vascular/Lymphatic: Stable bilobed infrarenal abdominal aortic aneurysm, with the superior aspect measuring up to 5.4 cm in diameter and the infrarenal component measuring up to 4.0 cm in diameter. No significant change since prior exam. Diffuse aortic atherosclerosis. No pathologic adenopathy. Reproductive: Uterus and bilateral adnexa are unremarkable. Other: No free fluid or free intraperitoneal gas. Stable small left inguinal hernia containing a portion of the small bowel, without obstruction or incarceration. Musculoskeletal: No acute or destructive bony abnormalities. Chronic compression deformity at L4 with prior vertebral augmentation. Stable degenerative changes at the lumbosacral junction. Reconstructed images demonstrate no additional findings. IMPRESSION: 1. Minimally displaced left fourth through tenth rib fractures. 2. Trace left pleural effusion and left lower lobe atelectasis. No pneumothorax. 3. No acute intra-abdominal or intrapelvic trauma. 4. Stable bilobed infrarenal abdominal aortic aneurysm measuring up to 5.4 cm. Recommend CTA or MRA, as appropriate, in 6 months and referral to a vascular specialist. Reference: Journal of Vascular Surgery 67.1 (2018): 2-77. J Am Coll Radiol 2013;10:789-794. 5. Mild bilateral hydronephrosis and hydroureter, likely due to marked distension of the urinary bladder. 6. Moderate hiatal hernia. 7. Cardiomegaly. 8. Aortic Atherosclerosis (ICD10-I70.0). Coronary artery atherosclerosis. Electronically Signed   By: Ozell Daring M.D.   On: 08/12/2024 17:48   CT Cervical Spine Wo Contrast Result Date: 08/12/2024 EXAM: CT CERVICAL SPINE WITHOUT CONTRAST 08/12/2024 01:02:05 PM TECHNIQUE: CT of the cervical spine was performed without  the administration of intravenous contrast. Multiplanar reformatted images are provided for review. Automated exposure control, iterative reconstruction, and/or weight based adjustment of the mA/kV was utilized to reduce the radiation dose to as low as reasonably achievable. COMPARISON: None available. CLINICAL HISTORY: Neck trauma (Age >= 65y). FINDINGS: BONES AND ALIGNMENT: No acute fracture or traumatic malalignment. DEGENERATIVE CHANGES: Disc space narrowing at multiple levels throughout the cervical spine. There are disc osteophyte complexes at multiple levels with mild osseous spinal canal stenosis appreciated at C3-C4 and C4-C5. Facet arthrosis and uncovertebral hypertrophy at multiple levels. Foraminal stenosis is most pronounced at C3-C4 and C4-C5 and on the right at C5-C6. SOFT TISSUES: No prevertebral soft tissue swelling. Atherosclerosis at the carotid bifurcations. LUNGS: Biapical pleural scarring. IMPRESSION: 1. No evidence of acute traumatic injury. Electronically signed by: Donnice Mania MD 08/12/2024 01:21 PM EST RP Workstation: [id number]   CT Head Wo Contrast Result Date: 08/12/2024 EXAM: CT HEAD WITHOUT CONTRAST 08/12/2024 01:02:05 PM TECHNIQUE: CT of the head was performed without the administration of intravenous contrast. Automated exposure control, iterative reconstruction, and/or weight based adjustment of the mA/kV was utilized to reduce the radiation dose to as low as reasonably achievable. COMPARISON: 11/18/2012 CLINICAL HISTORY: Head trauma, minor (Age >= 65y) FINDINGS: BRAIN AND VENTRICLES: No acute hemorrhage. No evidence of acute infarct. No hydrocephalus. No extra-axial collection. No mass effect or midline shift. Mild age-appropriate parenchymal volume loss. Mild periventricular white matter changes, likely sequela of small vessel ischemia.  Small calcified extra-axial lesion along right frontal convexity, likely tiny calcified meningioma. Mild atherosclerotic calcification within  carotid siphons. ORBITS: No acute abnormality. SINUSES: No acute abnormality. SOFT TISSUES AND SKULL: No acute soft tissue abnormality. No skull fracture. IMPRESSION: 1. No acute intracranial abnormality. Electronically signed by: Donnice Mania MD 08/12/2024 01:10 PM EST RP Workstation: [id number]   DG Chest 2 View Result Date: 08/12/2024 CLINICAL DATA:  Fall and chest wall pain. EXAM: CHEST - 2 VIEW COMPARISON:  Chest CT dated 11/24/2014. FINDINGS: Shallow inspiration. Bibasilar densities, left greater than right, likely atelectasis pneumonia is not excluded. A trace left pleural effusion may be present. No pneumothorax. Mild cardiomegaly. Osteopenia with degenerative changes of the spine. Multilevel old thoracic compression fractures and vertebroplasties. Multiple displaced left rib fractures including fractures of left sixth, seventh, and eighth ribs. Some ribs appear fractured in more than one place. IMPRESSION: 1. Multiple displaced left rib fractures. No pneumothorax. 2. Bibasilar atelectasis versus pneumonia. Electronically Signed   By: Vanetta Chou M.D.   On: 08/12/2024 13:06        Scheduled Meds:  amLODipine   5 mg Oral Daily   brimonidine   1 drop Left Eye BID   cholecalciferol   2,000 Units Oral Q lunch   diclofenac  Sodium  2 g Topical QID   heparin   5,000 Units Subcutaneous Q8H   latanoprost   1 drop Both Eyes QHS   levothyroxine   112 mcg Oral Q0600   lidocaine   1 patch Transdermal Q24H   melatonin  10 mg Oral QHS   metoprolol  succinate  100 mg Oral Daily   pantoprazole   40 mg Oral Daily   polyethylene glycol  17 g Oral Daily   tamsulosin   0.8 mg Oral Daily   timolol   1 drop Both Eyes BID   traMADol   50 mg Oral TID   traZODone   50 mg Oral QHS   Continuous Infusions:   LOS: 1 day     Devaughn KATHEE Ban, MD [facility]   If 7PM-7AM, please contact night-coverage [email or web address] Password [facility] 08/13/2024, 4:23 PM     "

## 2024-08-13 NOTE — Evaluation (Signed)
 Occupational Therapy Evaluation Patient Details Name: Brittney Simpson MRN: [id number] DOB: May 20, 1928 Today's Date: 08/13/2024   History of Present Illness   Brittney Simpson is a 89 y.o. female with past medical history of hypertension, hyperlipidemia, CKD, hypothyroidism, AAA, and chronic pain syndrome who presents to the ED complaining of fall. Patient fund to have multiple rib fractures  4-10.     Clinical Impressions Pt admitted with above. Prior to admission, pt lives alone and performs ADLs mod independent using SPC vs RW. Family provides intermittent assist for IADLs including groceries and meal prep. Pt extremely HOH, A&Ox4, follows commands when she is able to hear them. Performs STS transfers requiring CGA for safety, pt unsteady and high falls risk. Demonstrated ability to perform LB dressing using seated figure four, and completes functional mobility in hallway using RW, up to MIN A for safety as pt with poor proximity to AD, runs into objects and veers R. Recommending continued +1 hands on assist for mobility and ADL performance as pt is high falls risk, will need 24/7 supervision and assist at discharge.   Pt would benefit from skilled OT services to address noted impairments and functional limitations (see below for any additional details) in order to maximize safety and independence while minimizing falls risk and caregiver burden. Anticipate the need for follow up [facility] OT services upon acute hospital DC.      If plan is discharge home, recommend the following:   A little help with walking and/or transfers;A little help with bathing/dressing/bathroom;Direct supervision/assist for medications management;Direct supervision/assist for financial management;Assist for transportation;Help with stairs or ramp for entrance;Supervision due to cognitive status     Functional Status Assessment   Patient has had a recent decline in their functional status and demonstrates the  ability to make significant improvements in function in a reasonable and predictable amount of time.     Equipment Recommendations   BSC/3in1      Precautions/Restrictions   Precautions Precautions: Fall Recall of Precautions/Restrictions: Impaired Restrictions Weight Bearing Restrictions Per Provider Order: No     Mobility Bed Mobility Overal bed mobility: Needs Assistance             General bed mobility comments: NT pt up and left in recliner end of session    Transfers Overall transfer level: Needs assistance Equipment used: None, Rolling walker (2 wheels) Transfers: Sit to/from Stand Sit to Stand: Supervision           General transfer comment: pt able to stand from recliner without AD, extremely unsteady requiring at least CGA for safety. abandons RW during turning to sit back in recliner end of session, does not hear cues to redirect with poor safety awareness.      Balance Overall balance assessment: Needs assistance Sitting-balance support: Feet supported Sitting balance-Leahy Scale: Good Sitting balance - Comments: pain when reaching outside BOS 2/2 rib fx   Standing balance support: Bilateral upper extremity supported, During functional activity, Reliant on assistive device for balance Standing balance-Leahy Scale: Fair Standing balance comment: requires external support, unsteady with postural sway                           ADL either performed or assessed with clinical judgement   ADL Overall ADL's : Needs assistance/impaired Eating/Feeding: Independent;Sitting                   Lower Body Dressing: Supervision/safety;Sitting/lateral leans Lower Body Dressing Details (indicate  cue type and reason): doff/don socks seated figure four Toilet Transfer: Ambulation;Rolling walker (2 wheels);Contact guard assist;Minimal assistance Toilet Transfer Details (indicate cue type and reason): requires cues for safety, sequencing and AD  proximity. pt bumping into objects on R side with RW, frequently veers right, recommend +1 hands-on assist for mobility due to high falls risk and decreased safety awareness         Functional mobility during ADLs: Minimal assistance;Contact guard assist;Cueing for safety;Cueing for sequencing;Rolling walker (2 wheels) General ADL Comments: Pt very HOH, often does not follow commands suspect due to [facility] vs cognition. Decreased safety awareness, poor mgmt of RW     Vision Baseline Vision/History: 1 Wears glasses Ability to See in Adequate Light: 0 Adequate Patient Visual Report: No change from baseline              Pertinent Vitals/Pain Pain Assessment Pain Assessment: Faces Faces Pain Scale: Hurts little more Pain Location: L side ( ribs) Pain Descriptors / Indicators: Discomfort, Grimacing, Guarding Pain Intervention(s): Limited activity within patient's tolerance, Patient requesting pain meds-RN notified, Monitored during session     Extremity/Trunk Assessment Upper Extremity Assessment Upper Extremity Assessment: Generalized weakness   Lower Extremity Assessment Lower Extremity Assessment: Generalized weakness   Cervical / Trunk Assessment Cervical / Trunk Assessment: Other exceptions (rib fx)   Communication Communication Communication: Impaired Factors Affecting Communication: Hearing impaired   Cognition Arousal: Alert Behavior During Therapy: WFL for tasks assessed/performed Cognition: No family/caregiver present to determine baseline, Cognition impaired, Difficult to assess Difficult to assess due to: Hard of hearing/deaf Orientation impairments:  (A&Ox4) Awareness: Intellectual awareness impaired Memory impairment (select all impairments): Short-term memory, Working memory Attention impairment (select first level of impairment): Sustained attention Executive functioning impairment (select all impairments): Problem solving, Reasoning                    Following commands: Impaired Following commands impaired: Follows one step commands inconsistently     Cueing  General Comments   Cueing Techniques: Verbal cues;Gestural cues;Tactile cues              Home Living Family/patient expects to be discharged to:: Private residence Living Arrangements: Alone Available Help at Discharge: Family;Available PRN/intermittently Type of Home: House Home Access: Stairs to enter     Home Layout: One level     Bathroom Shower/Tub: Nurse, Adult: Standard     Home Equipment: Agricultural Consultant (2 wheels);Cane - single point          Prior Functioning/Environment Prior Level of Function : Independent/Modified Independent;History of Falls (last six months)             Mobility Comments: SPC or RW ADLs Comments: mod independent    OT Problem List: Decreased strength;Decreased range of motion;Impaired balance (sitting and/or standing);Decreased activity tolerance;Decreased cognition;Decreased safety awareness;Pain;Decreased knowledge of precautions;Decreased knowledge of use of DME or AE   OT Treatment/Interventions: Self-care/ADL training;Energy conservation;DME and/or AE instruction;Therapeutic activities;Patient/family education;Balance training      OT Goals(Current goals can be found in the care plan section)   Acute Rehab OT Goals OT Goal Formulation: With patient Time For Goal Achievement: 08/27/24 Potential to Achieve Goals: Fair   OT Frequency:  Min 2X/week       AM-PAC OT 6 Clicks Daily Activity     Outcome Measure Help from another person eating meals?: None Help from another person taking care of personal grooming?: None Help from another person toileting, which includes using toliet,  bedpan, or urinal?: A Little Help from another person bathing (including washing, rinsing, drying)?: A Little Help from another person to put on and taking off regular upper body clothing?: A  Little Help from another person to put on and taking off regular lower body clothing?: A Little 6 Click Score: 20   End of Session Equipment Utilized During Treatment: Rolling walker (2 wheels) Nurse Communication: Patient requests pain meds;Mobility status  Activity Tolerance: Patient tolerated treatment well Patient left: in chair;with call bell/phone within reach;with chair alarm set  OT Visit Diagnosis: Pain;Repeated falls (R29.6);Muscle weakness (generalized) (M62.81);Other abnormalities of gait and mobility (R26.89);Unsteadiness on feet (R26.81);History of falling (Z91.81) Pain - Right/Left: Right Pain - part of body:  (ribs)                Time: 8970-8954 OT Time Calculation (min): 16 min Charges:  OT General Charges $OT Visit: 1 Visit OT Evaluation $OT Eval Low Complexity: 1 Low  Mersadies Petree L. Kamilia Carollo, OTR/L  08/13/24, 10:52 AM

## 2024-08-13 NOTE — TOC CM/SW Note (Signed)
 Transition of Care (TOC) CM/SW Note   .Patient is not able to walk the distance required to go the bathroom, or he/she is unable to safely negotiate stairs required to access the bathroom.  A 3in1 BSC will alleviate this problem

## 2024-08-14 DIAGNOSIS — S2242XA Multiple fractures of ribs, left side, initial encounter for closed fracture: Secondary | ICD-10-CM | POA: Diagnosis not present

## 2024-08-14 LAB — BASIC METABOLIC PANEL WITH GFR
Anion gap: 7 (ref 5–15)
BUN: 10 mg/dL (ref 8–23)
CO2: 27 mmol/L (ref 22–32)
Calcium: 9.1 mg/dL (ref 8.9–10.3)
Chloride: 100 mmol/L (ref 98–111)
Creatinine, Ser: 0.61 mg/dL (ref 0.44–1.00)
GFR, Estimated: >60 mL/min
Glucose, Bld: 125 mg/dL — ABNORMAL HIGH (ref 70–99)
Potassium: 3.8 mmol/L (ref 3.5–5.1)
Sodium: 134 mmol/L — ABNORMAL LOW (ref 135–145)

## 2024-08-14 MED ORDER — OXYCODONE HCL 5 MG PO TABS: 5.0000 mg | ORAL_TABLET | Freq: Four times a day (QID) | ORAL | 0 refills | Status: AC | PRN

## 2024-08-14 NOTE — Discharge Summary (Signed)
 Brittney Simpson FMW:[id number] DOB: 1927-08-28 DOA: 08/12/2024  PCP: Edman Marsa PARAS, DO  Admit date: 08/12/2024 Discharge date: 08/14/2024  Time spent: 35 minutes  Recommendations for Outpatient Follow-up:  Pcp and urology f/u     Discharge Diagnoses:  Principal Problem:   Multiple rib fractures Active Problems:   Chronic pain syndrome   History of vertebral compression fracture (T7, T12, L4)   Hypothyroidism   Osteoporosis   CKD stage 3a, GFR 45-59 ml/min (HCC)   Hypertension   Discharge Condition: improving  Diet recommendation: heart healthy  Filed Weights   08/12/24 2028  Weight: 51.2 kg    History of present illness:  From admission h and p Brittney Simpson is a 89 y.o. female with medical history significant of hearing impairment, hypertension, hyperlipidemia, chronic kidney disease, hypothyroidism, AAA, chronic pain syndrome secondary to chronic back pain.  At baseline, patient lives alone.  She is accompanied by her daughter who was at bedside.  Patient had been in her usual state of health but reportedly had a fall at home last night.  She is reported to have had multiple falls at home over the past 24 hours.  She was unable to state how and why she fell.  Denied loss of consciousness.  Admitted to having hit her head.  She denies any chest pain or shortness of breath.  Denies any visual changes.  Denies any recent change in medications.   In the ED, CT scan of the head showed no acute abnormality.  CT neck showed no evidence of acute traumatic injury.  CT showed multiple left-sided rib fractures.  Hospital Course:   # Rib fractures After slip and fall, left 4-10, minimally displaced, CT no complications. Room air. No other apparent injury. - IS - monitor for pneumonia - pain control - pt/ot advising home with home health, ordered, with bedside commode, has all other dme   # Acute urinary retention Recent toviaz  likely contributory. Foley placed  in ER. Now discontinued and voiding spontaneously - urology f/u - hold toviaz  - continue home flomax   Other chronic medical problems stable. D/c plan reviewed with daughter carol who is in agreement.  Procedures: none   Consultations: none  Discharge Exam: Vitals:   08/14/24 0427 08/14/24 0735  BP:  (!) 159/80  Pulse: 75 76  Resp:  17  Temp:  98.1 F (36.7 C)  SpO2: 98% 94%    General: NAD, hard of hearing Cardiovascular: RRR Respiratory: CTAB  Discharge Instructions   Discharge Instructions     Diet general   Complete by: As directed    Increase activity slowly   Complete by: As directed       Allergies as of 08/14/2024   No Known Allergies      Medication List     STOP taking these medications    Trospium  Chloride 60 MG Cp24       TAKE these medications    acetaminophen  500 MG tablet Commonly known as: TYLENOL  Take 1,000 mg by mouth daily as needed for moderate pain or headache.   alendronate  70 MG tablet Commonly known as: FOSAMAX  Take 1 tablet (70 mg total) by mouth every 7 (seven) days. Take with a full glass of water on an empty stomach.   amLODipine  5 MG tablet Commonly known as: NORVASC  Take 1 tablet (5 mg total) by mouth daily.   brimonidine  0.2 % ophthalmic solution Commonly known as: ALPHAGAN  Place 1 drop into the left eye 2 (two) times  daily.   clonazePAM  0.5 MG tablet Commonly known as: KLONOPIN  TAKE 1/2 TABLET BY MOUTH TWICE DAILY   Cranberry 500 MG Caps Take 1,000 mg by mouth daily with supper.   furosemide  20 MG tablet Commonly known as: LASIX  TAKE 1 and 1/2 TABLETS BY MOUTH ONCE DAILY   latanoprost  0.005 % ophthalmic solution Commonly known as: XALATAN  Place 1 drop into both eyes at bedtime.   levothyroxine  112 MCG tablet Commonly known as: SYNTHROID  TAKE 1 TABLET BY MOUTH ONCE DAILY ON AN EMPTY STOMACH. WAIT 30 MINUTES BEFORE TAKING OTHER MEDS.   lisinopril  40 MG tablet Commonly known as: ZESTRIL  Take 1  tablet (40 mg total) by mouth daily.   Melatonin 10 MG Tabs Take 10 mg by mouth at bedtime.   metoprolol  succinate 100 MG 24 hr tablet Commonly known as: TOPROL -XL Take 1 tablet (100 mg total) by mouth daily. with food   omeprazole  20 MG capsule Commonly known as: PRILOSEC Take 1 capsule (20 mg total) by mouth daily. OFFICE VISIT NEEDED FOR ADDITIONAL REFILLS   oxyCODONE  5 MG immediate release tablet Commonly known as: Oxy IR/ROXICODONE  Take 1 tablet (5 mg total) by mouth every 6 (six) hours as needed for breakthrough pain.   tamsulosin  0.4 MG Caps capsule Commonly known as: FLOMAX  Take 2 capsules (0.8 mg total) by mouth daily.   timolol  0.5 % ophthalmic solution Commonly known as: TIMOPTIC  Place 1 drop into both eyes 2 (two) times daily.   traMADol  50 MG tablet Commonly known as: ULTRAM  Take 1 tablet (50 mg total) by mouth 3 (three) times daily. Each refill must last 30 days.   traZODone  50 MG tablet Commonly known as: DESYREL  TAKE 1 TABLET BY MOUTH AT BEDTIME   triamcinolone  ointment 0.1 % Commonly known as: KENALOG  Apply 1 gram twice daily to affected areas of skin. Stop once resolved and restart as needed for flares. Avoid use on face, armpits, groin unless otherwise indicated.   Vitamin D3 50 MCG (2000 UT) Tabs Take 2,000 Units by mouth daily with lunch.               Durable Medical Equipment  (From admission, onward)           Start     Ordered   08/13/24 1126  For home use only DME Bedside commode  Once       Question:  Patient needs a bedside commode to treat with the following condition  Answer:  Rib fracture   08/13/24 1126           Allergies[1]  Contact information for follow-up providers     Edman Marsa PARAS, DO Follow up.   Specialty: Family Medicine Why: 1 wk Contact information: [address] [place] [address] [phone]         your urologist Follow up.               Contact information for  after-discharge care     Home Medical Care     [facility] [place] .   Service: Home Health Services Contact information: [phone] [place]  [address] [phone]                      The results of significant diagnostics from this hospitalization (including imaging, microbiology, ancillary and laboratory) are listed below for reference.    Significant Diagnostic Studies: CT CHEST ABDOMEN PELVIS W CONTRAST Result Date: 08/12/2024 CLINICAL DATA:  Multiple unwitnessed falls  yesterday and today, leg pain, shoulder pain, back pain EXAM: CT CHEST, ABDOMEN, AND PELVIS WITH CONTRAST TECHNIQUE: Multidetector CT imaging of the chest, abdomen and pelvis was performed following the standard protocol during bolus administration of intravenous contrast. RADIATION DOSE REDUCTION: This exam was performed according to the departmental dose-optimization program which includes automated exposure control, adjustment of the mA and/or kV according to patient size and/or use of iterative reconstruction technique. CONTRAST:  OMNIPAQUE  IOHEXOL  300 MG/ML  SOLN COMPARISON:  06/16/2024, 08/12/2024 FINDINGS: CT CHEST FINDINGS Cardiovascular: The heart is enlarged without pericardial effusion. Marked ectasia of the thoracic aorta. No evidence of thoracic aortic aneurysm or dissection. No evidence of vascular injury. Atherosclerosis of the aorta and coronary vasculature. Mediastinum/Nodes: No enlarged mediastinal, hilar, or axillary lymph nodes. Thyroid  gland, trachea, and esophagus demonstrate no significant findings. Moderate hiatal hernia. Lungs/Pleura: Chronic elevation of the left hemidiaphragm. Trace left pleural effusion. Dependent left lower lobe atelectasis. No airspace disease or pneumothorax. Central airways are patent. Musculoskeletal: There are minimally displaced left anterolateral fourth through tenth rib fractures. Chronic T7 and T12 compression deformities with prior  vertebral augmentations. Reconstructed images demonstrate no additional findings. CT ABDOMEN PELVIS FINDINGS Hepatobiliary: No focal liver abnormality is seen. Status post cholecystectomy. No biliary dilatation. Pancreas: Unremarkable. No pancreatic ductal dilatation or surrounding inflammatory changes. Spleen: Scattered indeterminate hypodensities within the spleen, indeterminate but likely representing cysts or hemangiomas. No evidence of splenomegaly or acute injury. Adrenals/Urinary Tract: Bilateral renal cortical atrophy. Mild bilateral hydronephrosis and hydroureter likely due to markedly distended urinary bladder. No urinary tract calculi. The adrenals are unremarkable. Stomach/Bowel: No bowel obstruction or ileus. Normal appendix right lower quadrant. No bowel wall thickening or inflammatory change. Moderate hiatal hernia. Vascular/Lymphatic: Stable bilobed infrarenal abdominal aortic aneurysm, with the superior aspect measuring up to 5.4 cm in diameter and the infrarenal component measuring up to 4.0 cm in diameter. No significant change since prior exam. Diffuse aortic atherosclerosis. No pathologic adenopathy. Reproductive: Uterus and bilateral adnexa are unremarkable. Other: No free fluid or free intraperitoneal gas. Stable small left inguinal hernia containing a portion of the small bowel, without obstruction or incarceration. Musculoskeletal: No acute or destructive bony abnormalities. Chronic compression deformity at L4 with prior vertebral augmentation. Stable degenerative changes at the lumbosacral junction. Reconstructed images demonstrate no additional findings. IMPRESSION: 1. Minimally displaced left fourth through tenth rib fractures. 2. Trace left pleural effusion and left lower lobe atelectasis. No pneumothorax. 3. No acute intra-abdominal or intrapelvic trauma. 4. Stable bilobed infrarenal abdominal aortic aneurysm measuring up to 5.4 cm. Recommend CTA or MRA, as appropriate, in 6 months and  referral to a vascular specialist. Reference: Journal of Vascular Surgery 67.1 (2018): 2-77. J Am Coll Radiol 2013;10:789-794. 5. Mild bilateral hydronephrosis and hydroureter, likely due to marked distension of the urinary bladder. 6. Moderate hiatal hernia. 7. Cardiomegaly. 8. Aortic Atherosclerosis (ICD10-I70.0). Coronary artery atherosclerosis. Electronically Signed   By: Ozell Daring M.D.   On: 08/12/2024 17:48   CT Cervical Spine Wo Contrast Result Date: 08/12/2024 EXAM: CT CERVICAL SPINE WITHOUT CONTRAST 08/12/2024 01:02:05 PM TECHNIQUE: CT of the cervical spine was performed without the administration of intravenous contrast. Multiplanar reformatted images are provided for review. Automated exposure control, iterative reconstruction, and/or weight based adjustment of the mA/kV was utilized to reduce the radiation dose to as low as reasonably achievable. COMPARISON: None available. CLINICAL HISTORY: Neck trauma (Age >= 65y). FINDINGS: BONES AND ALIGNMENT: No acute fracture or traumatic malalignment. DEGENERATIVE CHANGES: Disc space narrowing at multiple levels  throughout the cervical spine. There are disc osteophyte complexes at multiple levels with mild osseous spinal canal stenosis appreciated at C3-C4 and C4-C5. Facet arthrosis and uncovertebral hypertrophy at multiple levels. Foraminal stenosis is most pronounced at C3-C4 and C4-C5 and on the right at C5-C6. SOFT TISSUES: No prevertebral soft tissue swelling. Atherosclerosis at the carotid bifurcations. LUNGS: Biapical pleural scarring. IMPRESSION: 1. No evidence of acute traumatic injury. Electronically signed by: Donnice Mania MD 08/12/2024 01:21 PM EST RP Workstation: [id number]   CT Head Wo Contrast Result Date: 08/12/2024 EXAM: CT HEAD WITHOUT CONTRAST 08/12/2024 01:02:05 PM TECHNIQUE: CT of the head was performed without the administration of intravenous contrast. Automated exposure control, iterative reconstruction, and/or weight based  adjustment of the mA/kV was utilized to reduce the radiation dose to as low as reasonably achievable. COMPARISON: 11/18/2012 CLINICAL HISTORY: Head trauma, minor (Age >= 65y) FINDINGS: BRAIN AND VENTRICLES: No acute hemorrhage. No evidence of acute infarct. No hydrocephalus. No extra-axial collection. No mass effect or midline shift. Mild age-appropriate parenchymal volume loss. Mild periventricular white matter changes, likely sequela of small vessel ischemia. Small calcified extra-axial lesion along right frontal convexity, likely tiny calcified meningioma. Mild atherosclerotic calcification within carotid siphons. ORBITS: No acute abnormality. SINUSES: No acute abnormality. SOFT TISSUES AND SKULL: No acute soft tissue abnormality. No skull fracture. IMPRESSION: 1. No acute intracranial abnormality. Electronically signed by: Donnice Mania MD 08/12/2024 01:10 PM EST RP Workstation: [id number]   DG Chest 2 View Result Date: 08/12/2024 CLINICAL DATA:  Fall and chest wall pain. EXAM: CHEST - 2 VIEW COMPARISON:  Chest CT dated 11/24/2014. FINDINGS: Shallow inspiration. Bibasilar densities, left greater than right, likely atelectasis pneumonia is not excluded. A trace left pleural effusion may be present. No pneumothorax. Mild cardiomegaly. Osteopenia with degenerative changes of the spine. Multilevel old thoracic compression fractures and vertebroplasties. Multiple displaced left rib fractures including fractures of left sixth, seventh, and eighth ribs. Some ribs appear fractured in more than one place. IMPRESSION: 1. Multiple displaced left rib fractures. No pneumothorax. 2. Bibasilar atelectasis versus pneumonia. Electronically Signed   By: Vanetta Chou M.D.   On: 08/12/2024 13:06    Microbiology: No results found for this or any previous visit (from the past 240 hours).   Labs: Basic Metabolic Panel: Recent Labs  Lab 08/12/24 1226 08/13/24 0630 08/14/24 0544  NA 131* 134* 134*  K 5.7* 3.6 3.8   CL 97* 99 100  CO2 22 28 27   GLUCOSE 139* 112* 125*  BUN 14 10 10   CREATININE 0.82 0.66 0.61  CALCIUM 9.2 8.8* 9.1   Liver Function Tests: Recent Labs  Lab 08/12/24 1226  AST 34  ALT 11  ALKPHOS 64  BILITOT 0.5  PROT 6.9  ALBUMIN 3.7   Recent Labs  Lab 08/12/24 1226  LIPASE 50   No results for input(s): AMMONIA in the last 168 hours. CBC: Recent Labs  Lab 08/12/24 1347 08/13/24 0630  WBC 6.7 4.6  HGB 10.3* 9.7*  HCT 33.8* 32.7*  MCV 79.5* 82.0  PLT 195 175   Cardiac Enzymes: No results for input(s): CKTOTAL, CKMB, CKMBINDEX, TROPONINI in the last 168 hours. BNP: BNP (last 3 results) No results for input(s): BNP in the last 8760 hours.  ProBNP (last 3 results) No results for input(s): PROBNP in the last 8760 hours.  CBG: No results for input(s): GLUCAP in the last 168 hours.     Signed:  Devaughn KATHEE Ban MD.  [facility] 08/14/2024, 10:20 AM     [  1] No Known Allergies

## 2024-08-14 NOTE — Plan of Care (Signed)
   Problem: Nutrition: Goal: Adequate nutrition will be maintained Outcome: Progressing   Problem: Pain Managment: Goal: General experience of comfort will improve and/or be controlled Outcome: Progressing   Problem: Safety: Goal: Ability to remain free from injury will improve Outcome: Progressing

## 2024-08-14 NOTE — Plan of Care (Signed)

## 2024-08-16 ENCOUNTER — Telehealth: Payer: Self-pay

## 2024-08-16 NOTE — Telephone Encounter (Signed)
 Okay to proceed w/ verbal orders for PT  Marsa Officer, DO [facility] 08/16/2024, 3:56 PM

## 2024-08-16 NOTE — Transitions of Care (Post Inpatient/ED Visit) (Signed)
 "  08/16/2024  Name: Brittney Simpson MRN: [id number] DOB: 1928-05-13  Today's TOC FU Call Status: Today's TOC FU Call Status:: Successful TOC FU Call Completed TOC FU Call Complete Date: 08/16/24  Patient's Name and Date of Birth confirmed. DOB, Name  Transition Care Management Follow-up Telephone Call Date of Discharge: 08/14/24 Discharge Facility: [facility]) Type of Discharge: Inpatient Admission Primary Inpatient Discharge Diagnosis:: Multiple Rib Fractures; Fall How have you been since you were released from the hospital?: Better Any questions or concerns?: No  Items Reviewed: Did you receive and understand the discharge instructions provided?: Yes Medications obtained,verified, and reconciled?: Yes (Medications Reviewed) Any new allergies since your discharge?: No Dietary orders reviewed?: NA Do you have support at home?: Yes People in Home [RPT]: child(ren), adult  Medications Reviewed Today: Medications Reviewed Today     Reviewed by Lavelle Charmaine NOVAK, LPN (Licensed Practical Nurse) on 08/16/24 at 1041  Med List Status: <None>   Medication Order Taking? Sig Documenting Provider Last Dose Status Informant  acetaminophen  (TYLENOL ) 500 MG tablet [id number] No Take 1,000 mg by mouth daily as needed for moderate pain or headache. [provider] Unknown Active Child  alendronate  (FOSAMAX ) 70 MG tablet [id number] No Take 1 tablet (70 mg total) by mouth every 7 (seven) days. Take with a full glass of water on an empty stomach. Edman Marsa PARAS, DO 08/06/2024 Active Child  amLODipine  (NORVASC ) 5 MG tablet [id number] No Take 1 tablet (5 mg total) by mouth daily. Edman Marsa PARAS, DO 08/11/2024 Evening Active Child  brimonidine  (ALPHAGAN ) 0.2 % ophthalmic solution [id number] No Place 1 drop into the left eye 2 (two) times daily. [provider] 08/12/2024 Active Child  Cholecalciferol  (VITAMIN D3) 50 MCG (2000 UT) TABS  [id number] No Take 2,000 Units by mouth daily with lunch. [provider] 08/12/2024 Active Child  clonazePAM  (KLONOPIN ) 0.5 MG tablet [id number] No TAKE 1/2 TABLET BY MOUTH TWICE DAILY Edman Marsa PARAS, DO Unknown Active Child  Cranberry 500 MG CAPS [id number] No Take 1,000 mg by mouth daily with supper. [provider] 08/11/2024 Evening Active Child  furosemide  (LASIX ) 20 MG tablet [id number] No TAKE 1 and 1/2 TABLETS BY MOUTH ONCE DAILY Edman Marsa PARAS, DO 08/12/2024 Active Child  latanoprost  (XALATAN ) 0.005 % ophthalmic solution [id number] No Place 1 drop into both eyes at bedtime.  [provider] 08/11/2024 Evening Active Child           Med Note ZIPPORAH, FRANCISCO A   Tue Jun 04, 2016  1:50 PM)    levothyroxine  (SYNTHROID ) 112 MCG tablet [id number] No TAKE 1 TABLET BY MOUTH ONCE DAILY ON AN EMPTY STOMACH. WAIT 30 MINUTES BEFORE TAKING OTHER MEDS. Edman Marsa PARAS, DO 08/12/2024 Active Child  lisinopril  (ZESTRIL ) 40 MG tablet [id number] No Take 1 tablet (40 mg total) by mouth daily. Edman Marsa PARAS, DO 08/12/2024 Active Child  Melatonin 10 MG TABS [id number] No Take 10 mg by mouth at bedtime. [provider] 08/11/2024 Active Child  metoprolol  succinate (TOPROL -XL) 100 MG 24 hr tablet [id number] No Take 1 tablet (100 mg total) by mouth daily. with food Edman Marsa PARAS, DO 08/12/2024 Active Child  omeprazole  (PRILOSEC) 20 MG capsule [id number] No Take 1 capsule (20 mg total) by mouth daily. OFFICE VISIT NEEDED FOR ADDITIONAL REFILLS Edman Marsa PARAS, DO 08/12/2024 Active Child  oxyCODONE  (OXY IR/ROXICODONE ) 5 MG immediate release tablet [id number]  Take 1 tablet (5 mg total) by mouth every 6 (six) hours as needed for  breakthrough pain. Wouk, Devaughn Sayres, MD  Active   tamsulosin  (FLOMAX ) 0.4 MG CAPS capsule [id number] No Take 2 capsules (0.8 mg total) by mouth daily. Gaston Hamilton, MD 08/11/2024 Evening Active Child  timolol   (TIMOPTIC ) 0.5 % ophthalmic solution [id number] No Place 1 drop into both eyes 2 (two) times daily. [provider] 08/12/2024 Morning Active Child  traMADol  (ULTRAM ) 50 MG tablet [id number] No Take 1 tablet (50 mg total) by mouth 3 (three) times daily. Each refill must last 30 days. Tanya Glisson, MD 08/12/2024 Active Child  traZODone  (DESYREL ) 50 MG tablet [id number] No TAKE 1 TABLET BY MOUTH AT BEDTIME Edman Marsa PARAS, DO Unknown Active Child  triamcinolone  ointment (KENALOG ) 0.1 % [id number]  Apply 1 gram twice daily to affected areas of skin. Stop once resolved and restart as needed for flares. Avoid use on face, armpits, groin unless otherwise indicated.  Patient not taking: Reported on 07/26/2024   Raymund Lauraine BROCKS, MD  Active Child  Med List Note Boneta Montie FALCON, RN 03/31/24 1011): [id number] MR 09/26/24 PA sent 05/24/21 Tramadol  key [id number] cg PA Methocarbamal sent via [facility]  Key [facility] 07-31-18 0319/25 PA for tramadol  [id number]            Home Care and Equipment/Supplies: Were Home Health Services Ordered?: Yes Name of Home Health Agency:: [facility] Has Agency set up a time to come to your home?: Yes First Home Health Visit Date: 08/16/24 Any new equipment or medical supplies ordered?: Yes Were you able to get the equipment/medical supplies?: Yes Do you have any questions related to the use of the equipment/supplies?: No  Functional Questionnaire: Do you need assistance with bathing/showering or dressing?: Yes Do you need assistance with meal preparation?: Yes Do you need assistance with eating?: No Do you have difficulty maintaining continence: No Do you need assistance with getting out of bed/getting out of a chair/moving?: Yes Do you have difficulty managing or taking your medications?: Yes  Follow up appointments reviewed: PCP Follow-up appointment confirmed?: Yes Date of PCP follow-up appointment?: 09/01/24 (daughter requested this date due to  husband's upcoming surgery and patient's limited mobilty) Follow-up Provider: Dr. Edman Specialist [facility] Follow-up appointment confirmed?: NA Do you need transportation to your follow-up appointment?: No Do you understand care options if your condition(s) worsen?: Yes-patient verbalized understanding    SIGNATURE Charmaine Bloodgood, LPN [facility] Health Advisor [place] [facility] You Are. We Are. [facility] Direct Dial [phone]  "

## 2024-08-16 NOTE — Telephone Encounter (Signed)
 Copied from CRM [id number]. Topic: Clinical - Home Health Verbal Orders >> Aug 16, 2024  3:14 PM Delon HERO wrote: Jatana King with [facility] calling for verbal orders for PT 2 W 1, 1 W 8 CB- [phone] Verbals ok VM.

## 2024-08-16 NOTE — Telephone Encounter (Signed)
 Jantana notified of verbal orders

## 2024-08-17 ENCOUNTER — Telehealth: Payer: Self-pay | Admitting: Family Medicine

## 2024-08-17 ENCOUNTER — Telehealth: Payer: Self-pay

## 2024-08-17 NOTE — Telephone Encounter (Signed)
 Can you call back on Weds 1/21, do they need verbal orders for [facility] for OT? Or do they need referral entered?  I will see her on 09/01/24 and can submit any other referrals if needed.  Yes OTC regimen especially Miralax  recommended and or enema if there is impaction for the constipation  Marsa Officer, DO [facility] 08/17/2024, 4:54 PM

## 2024-08-17 NOTE — Telephone Encounter (Signed)
 Copied from CRM [id number]. Topic: Clinical - Medication Question >> Aug 17, 2024 11:02 AM Hadassah PARAS wrote: Reason for CRM: Jatana King from [facility] is caling to advise pt has a possible Type 2 Drug interaction between lisinopril  and sulfamethoxazole-trimethoprim. Please advise physical therapist on [phone]

## 2024-08-17 NOTE — Addendum Note (Signed)
 Addended by: EDMAN MARSA PARAS on: 08/17/2024 12:19 PM   Modules accepted: Orders

## 2024-08-17 NOTE — Telephone Encounter (Signed)
 I called Brittney Simpson and discussed the issue. I looked over her chart and we didn't have record of the Bactrim Trimethoprim/Sulfa antibiotic for UTI prophylaxis on file, she was on it years ago but it appears discontinued on our end. But she has it in the home.   The potential interaction is Hyperkalemia. Her last K potassium was normal but she has had history of elevation in past.  She is on Lisinopril  40mg  and BP is normal range. She will see me on 2/4 for HFU we can discuss BP and meds and consider reducing Lisinopril  from 40 to 20mg  as an option to avoid less impact on this interaction for K and review her UTI prophylaxis

## 2024-08-17 NOTE — Telephone Encounter (Signed)
 [facility] home [facility] states that pt has not had a BM in over a week  they did purchased some OTC  to try tonight call back [phone].  Also [facility] need order  2 week 1 for  OT

## 2024-08-18 NOTE — Telephone Encounter (Signed)
 Left message for patient to return call OK to advise

## 2024-09-01 ENCOUNTER — Telehealth: Payer: Self-pay

## 2024-09-01 ENCOUNTER — Encounter: Payer: Self-pay | Admitting: Family Medicine

## 2024-09-01 ENCOUNTER — Ambulatory Visit: Admitting: Family Medicine

## 2024-09-01 VITALS — BP 124/70 | HR 50 | Ht 63.0 in | Wt 113.0 lb

## 2024-09-01 DIAGNOSIS — R0789 Other chest pain: Secondary | ICD-10-CM

## 2024-09-01 DIAGNOSIS — I129 Hypertensive chronic kidney disease with stage 1 through stage 4 chronic kidney disease, or unspecified chronic kidney disease: Secondary | ICD-10-CM

## 2024-09-01 DIAGNOSIS — R300 Dysuria: Secondary | ICD-10-CM

## 2024-09-01 DIAGNOSIS — G3184 Mild cognitive impairment, so stated: Secondary | ICD-10-CM

## 2024-09-01 DIAGNOSIS — E538 Deficiency of other specified B group vitamins: Secondary | ICD-10-CM

## 2024-09-01 DIAGNOSIS — R296 Repeated falls: Secondary | ICD-10-CM

## 2024-09-01 DIAGNOSIS — R350 Frequency of micturition: Secondary | ICD-10-CM

## 2024-09-01 DIAGNOSIS — S2242XA Multiple fractures of ribs, left side, initial encounter for closed fracture: Secondary | ICD-10-CM

## 2024-09-01 DIAGNOSIS — H9193 Unspecified hearing loss, bilateral: Secondary | ICD-10-CM

## 2024-09-01 DIAGNOSIS — G894 Chronic pain syndrome: Secondary | ICD-10-CM

## 2024-09-01 NOTE — Patient Instructions (Addendum)
 Thank you for coming to the office today.  I will place a [facility] referral for our Nurse Case Manager + Social Worker and they can contact you to setup further Home Care Services and get started.  Ask them about Life Alert system.  In the meantime, if you prefer to setup on your own, you can search for local Personal Care Services or Home Care / Home Aide - basically non medical personnel for home aide / chores / observation etc.  Labs and Urine today  Likely cognitive or memory can be related to the hospital stay and other factors related to the injuries.   Please schedule a Follow-up Appointment to: Return if symptoms worsen or fail to improve.  If you have any other questions or concerns, please feel free to call the office or send a message through MyChart. You may also schedule an earlier appointment if necessary.  Additionally, you may be receiving a survey about your experience at our office within a few days to 1 week by e-mail or mail. We value your feedback.  Marsa Officer, DO [facility], [place]

## 2024-09-01 NOTE — Progress Notes (Signed)
 "  Subjective:    Patient ID: Naomie GORMAN Keel, female    DOB: 1928/07/06, 89 y.o.   MRN: [id number]  ILINA XU is a 89 y.o. female presenting on 09/01/2024 for Hospitalization Follow-up   HPI  Discussed the use of AI scribe software for clinical note transcription with the patient, who gave verbal consent to proceed.  History of Present Illness   WANDY BOSSLER is a 89 year old female who presents for a hospital follow-up after sustaining seven rib fractures from a fall. She is accompanied by her daughter, who is her primary caregiver.  HOSPITAL FOLLOW-UP VISIT  Hospital/Location: [facility] Date of Admission: 08/12/24 Date of Discharge: 08/14/24 Transitions of care telephone call: 08/16/24 Charmaine Bloodgood LPN completed call  Reason for Admission: Fall Injury, Fractured Ribs, Head Injury  - Hospital H&P and Discharge Summary have been reviewed - Patient presents today 18 days after recent hospitalization.  Rib fractures and musculoskeletal pain - Sustained seven rib fractures after a fall on January 15th, following two episodes of loss of balance on the same day - Hospitalized from January 15th to January 17th, discharged after two days - Pain primarily on the left side, corresponding to the area of initial severe bruising - Bruising has improved since the injury - Breakthrough rx Oxycodone  used sparingly at night for severe pain  Chronic back pain Followed by [facility] - History of chronic back pain requiring spinal injections every three to six months - Worsening back pain since the fall, consistent with prior episodes - Tramadol  used for back pain and headaches routinely  Urinary symptoms - History of urinary incontinence and urinary retention - Previously on Toviaz  for leakage and Flomax  to aid urination - Toviaz  discontinued during hospitalization due to urinary retention - Foley catheter placed during hospital stay - Currently continuing Flomax  with  improvement in urination - No dysuria  Cognitive decline - Increased forgetfulness and confusion about daily activities and locations since the fall - Cognitive changes are uncharacteristic compared to baseline mental sharpness - Frequent repeating questions taking multiple times to understand situation, forget if took meds, or where family members are - Recent changes in living environment due to weather, staying with daughter and sister, possibly contributing to disorientation - asking about possible UTI, to check urine  Functional status and rehabilitation - Receiving in-home physical therapy once a week - Daughter is primary caregiver   [facility] PT OT from hospital discharge, OT resolved, now continue PT new DME equipment, shower chair  - Today reports overall has done well after discharge. Symptoms of pain have resolved - New medications on discharge: Oxycodone  short term, Flomax  - Changes to current meds on discharge: DC'd Toviaz   I have reviewed the discharge medication list, and have reconciled the current and discharge medications today.  Current Medications[1]  ------------------------------------------------------------------------- Social History[2]  Review of Systems Per HPI unless specifically indicated above     Objective:    BP 124/70 (BP Location: Left Arm, Patient Position: Sitting, Cuff Size: Normal)   Pulse (!) 50   Ht 5' 3 (1.6 m)   Wt 113 lb (51.3 kg)   SpO2 95%   BMI 20.02 kg/m   Wt Readings from Last 3 Encounters:  09/01/24 113 lb (51.3 kg)  08/12/24 112 lb 14 oz (51.2 kg)  07/26/24 120 lb (54.4 kg)    Physical Exam Vitals and nursing note reviewed.  Constitutional:      General: She is not in acute distress.  Appearance: Normal appearance. She is well-developed. She is not diaphoretic.     Comments: Elderly 89 yr female, thin frail, in wheelchair, uncomfortable with back hip pain, cooperative  HENT:     Head: Normocephalic and  atraumatic.  Eyes:     General:        Right eye: No discharge.        Left eye: No discharge.     Conjunctiva/sclera: Conjunctivae normal.  Neck:     Thyroid : No thyromegaly.  Cardiovascular:     Rate and Rhythm: Normal rate and regular rhythm.     Heart sounds: Normal heart sounds. No murmur heard. Pulmonary:     Effort: Pulmonary effort is normal. No respiratory distress.     Breath sounds: Normal breath sounds. No wheezing or rales.  Musculoskeletal:        General: Normal range of motion.     Cervical back: Normal range of motion and neck supple.     Right lower leg: No edema.     Left lower leg: No edema.  Lymphadenopathy:     Cervical: No cervical adenopathy.  Skin:    General: Skin is warm and dry.     Findings: Bruising (left lower rib cage, resolving now improved) present. No erythema or rash.  Neurological:     Mental Status: She is alert and oriented to person, place, and time.  Psychiatric:        Mood and Affect: Mood normal.        Behavior: Behavior normal.        Thought Content: Thought content normal.     Comments: Well groomed, good eye contact, normal speech and thoughts       I have personally reviewed the radiology report from 08/12/24 on Chest X-ray, CT Head, C Spine CT and CT Chest Abdomen  CLINICAL DATA:  Fall and chest wall pain.   EXAM: CHEST - 2 VIEW   COMPARISON:  Chest CT dated 11/24/2014.   FINDINGS: Shallow inspiration. Bibasilar densities, left greater than right, likely atelectasis pneumonia is not excluded. A trace left pleural effusion may be present. No pneumothorax. Mild cardiomegaly. Osteopenia with degenerative changes of the spine. Multilevel old thoracic compression fractures and vertebroplasties. Multiple displaced left rib fractures including fractures of left sixth, seventh, and eighth ribs. Some ribs appear fractured in more than one place.   IMPRESSION: 1. Multiple displaced left rib fractures. No pneumothorax. 2.  Bibasilar atelectasis versus pneumonia.     Electronically Signed   By: Vanetta Chou M.D.   On: 08/12/2024 13:06  ---------  EXAM: CT HEAD WITHOUT CONTRAST 08/12/2024 01:02:05 PM   TECHNIQUE: CT of the head was performed without the administration of intravenous contrast. Automated exposure control, iterative reconstruction, and/or weight based adjustment of the mA/kV was utilized to reduce the radiation dose to as low as reasonably achievable.   COMPARISON: 11/18/2012   CLINICAL HISTORY: Head trauma, minor (Age >= 65y)   FINDINGS:   BRAIN AND VENTRICLES: No acute hemorrhage. No evidence of acute infarct. No hydrocephalus. No extra-axial collection. No mass effect or midline shift. Mild age-appropriate parenchymal volume loss. Mild periventricular white matter changes, likely sequela of small vessel ischemia. Small calcified extra-axial lesion along right frontal convexity, likely tiny calcified meningioma. Mild atherosclerotic calcification within carotid siphons.   ORBITS: No acute abnormality.   SINUSES: No acute abnormality.   SOFT TISSUES AND SKULL: No acute soft tissue abnormality. No skull fracture.   IMPRESSION: 1. No acute intracranial  abnormality.   Electronically signed by: Donnice Mania MD 08/12/2024 01:10 PM EST RP Workstation: [id number]  --------------------------------   Narrative & Impression  EXAM: CT CERVICAL SPINE WITHOUT CONTRAST 08/12/2024 01:02:05 PM   TECHNIQUE: CT of the cervical spine was performed without the administration of intravenous contrast. Multiplanar reformatted images are provided for review. Automated exposure control, iterative reconstruction, and/or weight based adjustment of the mA/kV was utilized to reduce the radiation dose to as low as reasonably achievable.   COMPARISON: None available.   CLINICAL HISTORY: Neck trauma (Age >= 65y).   FINDINGS:   BONES AND ALIGNMENT: No acute fracture or  traumatic malalignment.   DEGENERATIVE CHANGES: Disc space narrowing at multiple levels throughout the cervical spine. There are disc osteophyte complexes at multiple levels with mild osseous spinal canal stenosis appreciated at C3-C4 and C4-C5. Facet arthrosis and uncovertebral hypertrophy at multiple levels. Foraminal stenosis is most pronounced at C3-C4 and C4-C5 and on the right at C5-C6.   SOFT TISSUES: No prevertebral soft tissue swelling. Atherosclerosis at the carotid bifurcations.   LUNGS: Biapical pleural scarring.   IMPRESSION: 1. No evidence of acute traumatic injury.   Electronically signed by: Donnice Mania MD 08/12/2024 01:21 PM EST RP Workstation: [id number]    -------------------------  CLINICAL DATA:  Multiple unwitnessed falls yesterday and today, leg pain, shoulder pain, back pain   EXAM: CT CHEST, ABDOMEN, AND PELVIS WITH CONTRAST   TECHNIQUE: Multidetector CT imaging of the chest, abdomen and pelvis was performed following the standard protocol during bolus administration of intravenous contrast.   RADIATION DOSE REDUCTION: This exam was performed according to the departmental dose-optimization program which includes automated exposure control, adjustment of the mA and/or kV according to patient size and/or use of iterative reconstruction technique.   CONTRAST:  OMNIPAQUE  IOHEXOL  300 MG/ML  SOLN   COMPARISON:  06/16/2024, 08/12/2024   FINDINGS: CT CHEST FINDINGS   Cardiovascular: The heart is enlarged without pericardial effusion. Marked ectasia of the thoracic aorta. No evidence of thoracic aortic aneurysm or dissection. No evidence of vascular injury. Atherosclerosis of the aorta and coronary vasculature.   Mediastinum/Nodes: No enlarged mediastinal, hilar, or axillary lymph nodes. Thyroid  gland, trachea, and esophagus demonstrate no significant findings. Moderate hiatal hernia.   Lungs/Pleura: Chronic elevation of the left  hemidiaphragm. Trace left pleural effusion. Dependent left lower lobe atelectasis. No airspace disease or pneumothorax. Central airways are patent.   Musculoskeletal: There are minimally displaced left anterolateral fourth through tenth rib fractures. Chronic T7 and T12 compression deformities with prior vertebral augmentations. Reconstructed images demonstrate no additional findings.   CT ABDOMEN PELVIS FINDINGS   Hepatobiliary: No focal liver abnormality is seen. Status post cholecystectomy. No biliary dilatation.   Pancreas: Unremarkable. No pancreatic ductal dilatation or surrounding inflammatory changes.   Spleen: Scattered indeterminate hypodensities within the spleen, indeterminate but likely representing cysts or hemangiomas. No evidence of splenomegaly or acute injury.   Adrenals/Urinary Tract: Bilateral renal cortical atrophy. Mild bilateral hydronephrosis and hydroureter likely due to markedly distended urinary bladder. No urinary tract calculi. The adrenals are unremarkable.   Stomach/Bowel: No bowel obstruction or ileus. Normal appendix right lower quadrant. No bowel wall thickening or inflammatory change. Moderate hiatal hernia.   Vascular/Lymphatic: Stable bilobed infrarenal abdominal aortic aneurysm, with the superior aspect measuring up to 5.4 cm in diameter and the infrarenal component measuring up to 4.0 cm in diameter. No significant change since prior exam. Diffuse aortic atherosclerosis. No pathologic adenopathy.   Reproductive: Uterus and bilateral adnexa are unremarkable.  Other: No free fluid or free intraperitoneal gas. Stable small left inguinal hernia containing a portion of the small bowel, without obstruction or incarceration.   Musculoskeletal: No acute or destructive bony abnormalities. Chronic compression deformity at L4 with prior vertebral augmentation. Stable degenerative changes at the lumbosacral junction. Reconstructed images  demonstrate no additional findings.   IMPRESSION: 1. Minimally displaced left fourth through tenth rib fractures. 2. Trace left pleural effusion and left lower lobe atelectasis. No pneumothorax. 3. No acute intra-abdominal or intrapelvic trauma. 4. Stable bilobed infrarenal abdominal aortic aneurysm measuring up to 5.4 cm. Recommend CTA or MRA, as appropriate, in 6 months and referral to a vascular specialist. Reference: Journal of Vascular Surgery 67.1 (2018): 2-77. J Am Coll Radiol 2013;10:789-794. 5. Mild bilateral hydronephrosis and hydroureter, likely due to marked distension of the urinary bladder. 6. Moderate hiatal hernia. 7. Cardiomegaly. 8. Aortic Atherosclerosis (ICD10-I70.0). Coronary artery atherosclerosis.     Electronically Signed   By: Ozell Daring M.D.   On: 08/12/2024 17:48  Results for orders placed or performed during the hospital encounter of 08/12/24  Basic metabolic panel   Collection Time: 08/12/24 12:26 PM  Result Value Ref Range   Sodium 131 (L) 135 - 145 mmol/L   Potassium 5.7 (H) 3.5 - 5.1 mmol/L   Chloride 97 (L) 98 - 111 mmol/L   CO2 22 22 - 32 mmol/L   Glucose, Bld 139 (H) 70 - 99 mg/dL   BUN 14 8 - 23 mg/dL   Creatinine, Ser 9.17 0.44 - 1.00 mg/dL   Calcium 9.2 8.9 - 89.6 mg/dL   GFR, Estimated >39 >39 mL/min   Anion gap 12 5 - 15  Hepatic function panel   Collection Time: 08/12/24 12:26 PM  Result Value Ref Range   Total Protein 6.9 6.5 - 8.1 g/dL   Albumin 3.7 3.5 - 5.0 g/dL   AST 34 15 - 41 U/L   ALT 11 0 - 44 U/L   Alkaline Phosphatase 64 38 - 126 U/L   Total Bilirubin 0.5 0.0 - 1.2 mg/dL   Bilirubin, Direct <9.8 0.0 - 0.2 mg/dL   Indirect Bilirubin NOT CALCULATED 0.3 - 0.9 mg/dL  Lipase, blood   Collection Time: 08/12/24 12:26 PM  Result Value Ref Range   Lipase 50 11 - 51 U/L  Troponin T, High Sensitivity   Collection Time: 08/12/24 12:26 PM  Result Value Ref Range   Troponin T High Sensitivity 20 (H) 0 - 19 ng/L  CBC    Collection Time: 08/12/24  1:47 PM  Result Value Ref Range   WBC 6.7 4.0 - 10.5 K/uL   RBC 4.25 3.87 - 5.11 MIL/uL   Hemoglobin 10.3 (L) 12.0 - 15.0 g/dL   HCT 66.1 (L) 63.9 - 53.9 %   MCV 79.5 (L) 80.0 - 100.0 fL   MCH 24.2 (L) 26.0 - 34.0 pg   MCHC 30.5 30.0 - 36.0 g/dL   RDW 84.7 88.4 - 84.4 %   Platelets 195 150 - 400 K/uL   nRBC 0.0 0.0 - 0.2 %  Troponin T, High Sensitivity   Collection Time: 08/12/24  1:47 PM  Result Value Ref Range   Troponin T High Sensitivity 19 0 - 19 ng/L  Urinalysis, Routine w reflex microscopic -Urine, Clean Catch   Collection Time: 08/12/24  6:52 PM  Result Value Ref Range   Color, Urine STRAW (A) YELLOW   APPearance CLEAR (A) CLEAR   Specific Gravity, Urine 1.027 1.005 - 1.030  pH 7.0 5.0 - 8.0   Glucose, UA NEGATIVE NEGATIVE mg/dL   Hgb urine dipstick NEGATIVE NEGATIVE   Bilirubin Urine NEGATIVE NEGATIVE   Ketones, ur NEGATIVE NEGATIVE mg/dL   Protein, ur NEGATIVE NEGATIVE mg/dL   Nitrite NEGATIVE NEGATIVE   Leukocytes,Ua NEGATIVE NEGATIVE  Basic metabolic panel   Collection Time: 08/13/24  6:30 AM  Result Value Ref Range   Sodium 134 (L) 135 - 145 mmol/L   Potassium 3.6 3.5 - 5.1 mmol/L   Chloride 99 98 - 111 mmol/L   CO2 28 22 - 32 mmol/L   Glucose, Bld 112 (H) 70 - 99 mg/dL   BUN 10 8 - 23 mg/dL   Creatinine, Ser 9.33 0.44 - 1.00 mg/dL   Calcium 8.8 (L) 8.9 - 10.3 mg/dL   GFR, Estimated >39 >39 mL/min   Anion gap 7 5 - 15  CBC   Collection Time: 08/13/24  6:30 AM  Result Value Ref Range   WBC 4.6 4.0 - 10.5 K/uL   RBC 3.99 3.87 - 5.11 MIL/uL   Hemoglobin 9.7 (L) 12.0 - 15.0 g/dL   HCT 67.2 (L) 63.9 - 53.9 %   MCV 82.0 80.0 - 100.0 fL   MCH 24.3 (L) 26.0 - 34.0 pg   MCHC 29.7 (L) 30.0 - 36.0 g/dL   RDW 84.6 88.4 - 84.4 %   Platelets 175 150 - 400 K/uL   nRBC 0.0 0.0 - 0.2 %  Basic metabolic panel with GFR   Collection Time: 08/14/24  5:44 AM  Result Value Ref Range   Sodium 134 (L) 135 - 145 mmol/L   Potassium 3.8 3.5 -  5.1 mmol/L   Chloride 100 98 - 111 mmol/L   CO2 27 22 - 32 mmol/L   Glucose, Bld 125 (H) 70 - 99 mg/dL   BUN 10 8 - 23 mg/dL   Creatinine, Ser 9.38 0.44 - 1.00 mg/dL   Calcium 9.1 8.9 - 89.6 mg/dL   GFR, Estimated >39 >39 mL/min   Anion gap 7 5 - 15      Assessment & Plan:   Problem List Items Addressed This Visit     Chronic pain syndrome (Chronic)   Relevant Orders   AMB Referral [facility] Care Management   Hard of hearing (Bilateral) (Chronic)   Relevant Orders   AMB Referral [facility] Care Management   Dysuria   Relevant Orders   Urinalysis, Routine w reflex microscopic   Urine Culture   Other Visit Diagnoses       Multiple closed fractures of left lower extremity and ribs    -  Primary     Left-sided chest wall pain         Mild cognitive impairment with memory loss       Relevant Orders   AMB Referral [facility] Care Management     Recurrent falls       Relevant Orders   AMB Referral [facility] Care Management     Benign hypertension with CKD (chronic kidney disease) stage III (HCC)       Relevant Orders   CBC with Differential/Platelet   Comprehensive metabolic panel with GFR   AMB Referral [facility] Care Management     Urinary frequency       Relevant Orders   Urinalysis, Routine w reflex microscopic   Urine Culture     B12 deficiency       Relevant Orders   Vitamin B12        Recurrent traumatic  Fall injuries Multiple rib fractures left lower Sustained fractures from a fall three weeks ago - healing now resolving issue, improved breathing and pain control Hospital eval and management, non surgical. Pain managed with tramadol  and oxycodone  for breakthrough. - Continue tramadol  for pain management - from Dr Tanya [facility], and now is using rare occasional oxycodone  AS NEEDED for severe breakthrough pain - Encouraged use of incentive spirometer for respiratory status after rib fractures  Chronic pain syndrome Pain related to rib fractures and back issues,  exacerbated by recent trauma. Followed by [facility] for injections and medication management. - Continue tramadol  for chronic pain management. - Use oxycodone  as needed for severe pain.  Urinary retention and incontinence Managed with Flomax . Toviaz  discontinued due to retention. Urination adequate with Flomax . - Continue Flomax  for urinary retention.  Mild cognitive impairment with memory loss Possibly exacerbated by recent trauma and hospitalization. No infection or major neurological issues. Electrolyte imbalances and anemia may contribute. Decline in functional / cognitive status Falls possibly due to balance issues and environmental changes. No specific trigger identified.  - Ordered blood tests for electrolytes and anemia. - Ordered urine analysis to rule out UTI.  - Referred to [facility] RN + LCSW for evaluation of needs and caregiver support in the home, currently living with daughter, previously independent, seems worsening cognition and fall risk, will need additional support. Asking for Personal Care Services or home aide, will consider options 3rd party or in community, advised that if she is ineligible for medicaid this is likely out of pocket and not covered - Also requesting Life Alert System, will ask RN Case Manager to assist with setup   Bilateral hearing loss Affects communication. Hearing aid issues noted, no recent ENT follow-up.  B12 deficiency May contribute to cognitive issues. - Ordered B12 level to assess deficiency.       No orders of the defined types were placed in this encounter.   Follow up plan: Return if symptoms worsen or fail to improve.   Marsa Officer, DO [facility] 09/01/2024, 11:02 AM     [1]  Current Outpatient Medications:    acetaminophen  (TYLENOL ) 500 MG tablet, Take 1,000 mg by mouth daily as needed for moderate pain or headache., Disp: , Rfl:    alendronate  (FOSAMAX ) 70  MG tablet, Take 1 tablet (70 mg total) by mouth every 7 (seven) days. Take with a full glass of water on an empty stomach., Disp: 4 tablet, Rfl: 11   amLODipine  (NORVASC ) 5 MG tablet, Take 1 tablet (5 mg total) by mouth daily., Disp: 90 tablet, Rfl: 3   brimonidine  (ALPHAGAN ) 0.2 % ophthalmic solution, Place 1 drop into the left eye 2 (two) times daily., Disp: , Rfl:    Cholecalciferol  (VITAMIN D3) 50 MCG (2000 UT) TABS, Take 2,000 Units by mouth daily with lunch., Disp: , Rfl:    clonazePAM  (KLONOPIN ) 0.5 MG tablet, TAKE 1/2 TABLET BY MOUTH TWICE DAILY, Disp: 30 tablet, Rfl: 5   Cranberry 500 MG CAPS, Take 1,000 mg by mouth daily with supper., Disp: , Rfl:    furosemide  (LASIX ) 20 MG tablet, TAKE 1 and 1/2 TABLETS BY MOUTH ONCE DAILY, Disp: 135 tablet, Rfl: 0   latanoprost  (XALATAN ) 0.005 % ophthalmic solution, Place 1 drop into both eyes at bedtime. , Disp: , Rfl:    levothyroxine  (SYNTHROID ) 112 MCG tablet, TAKE 1 TABLET BY MOUTH ONCE DAILY ON AN EMPTY STOMACH. WAIT 30 MINUTES BEFORE TAKING  OTHER MEDS., Disp: 90 tablet, Rfl: 3   lisinopril  (ZESTRIL ) 40 MG tablet, Take 1 tablet (40 mg total) by mouth daily., Disp: 90 tablet, Rfl: 3   Melatonin 10 MG TABS, Take 10 mg by mouth at bedtime., Disp: , Rfl:    metoprolol  succinate (TOPROL -XL) 100 MG 24 hr tablet, Take 1 tablet (100 mg total) by mouth daily. with food, Disp: 90 tablet, Rfl: 3   omeprazole  (PRILOSEC) 20 MG capsule, Take 1 capsule (20 mg total) by mouth daily. OFFICE VISIT NEEDED FOR ADDITIONAL REFILLS, Disp: 90 capsule, Rfl: 0   oxyCODONE  (OXY IR/ROXICODONE ) 5 MG immediate release tablet, Take 1 tablet (5 mg total) by mouth every 6 (six) hours as needed for breakthrough pain., Disp: 20 tablet, Rfl: 0   tamsulosin  (FLOMAX ) 0.4 MG CAPS capsule, Take 2 capsules (0.8 mg total) by mouth daily., Disp: 180 capsule, Rfl: 3   timolol  (TIMOPTIC ) 0.5 % ophthalmic solution, Place 1 drop into both eyes 2 (two) times daily., Disp: , Rfl:    traMADol   (ULTRAM ) 50 MG tablet, Take 1 tablet (50 mg total) by mouth 3 (three) times daily. Each refill must last 30 days., Disp: 90 tablet, Rfl: 5   traZODone  (DESYREL ) 50 MG tablet, TAKE 1 TABLET BY MOUTH AT BEDTIME, Disp: 90 tablet, Rfl: 0   triamcinolone  ointment (KENALOG ) 0.1 %, Apply 1 gram twice daily to affected areas of skin. Stop once resolved and restart as needed for flares. Avoid use on face, armpits, groin unless otherwise indicated. (Patient not taking: Reported on 09/01/2024), Disp: 30 g, Rfl: 0 [2]  Social History Tobacco Use   Smoking status: Never   Smokeless tobacco: Never  Vaping Use   Vaping status: Never Used  Substance Use Topics   Alcohol use: No    Alcohol/week: 0.0 standard drinks of alcohol   Drug use: No   "

## 2024-09-01 NOTE — Progress Notes (Signed)
 Complex Care Management Note  Care Guide Note 09/01/2024 Name: Brittney Simpson MRN: [id number] DOB: 04/23/1928  Brittney Simpson is a 89 y.o. year old female who sees Edman Marsa PARAS, DO for primary care. I reached out to Brittney Simpson by phone today to offer complex care management services.  Ms. Holsonback was given information about Complex Care Management services today including:   The Complex Care Management services include support from the care team which includes your Nurse Care Manager, Clinical Social Worker, or Pharmacist.  The Complex Care Management team is here to help remove barriers to the health concerns and goals most important to you. Complex Care Management services are voluntary, and the patient may decline or stop services at any time by request to their care team member.   Complex Care Management Consent Status: Patient agreed to services and verbal consent obtained.   Follow up plan:  Telephone appointment with complex care management team member scheduled for:  09/06/24 and 09/07/24  Encounter Outcome:  Patient Scheduled  Leotis Rase [facility], [facility] Guide  Direct Dial: [phone]  Fax [phone]

## 2024-09-02 LAB — COMPREHENSIVE METABOLIC PANEL WITH GFR
AG Ratio: 1.2 (calc) (ref 1.0–2.5)
ALT: 8 U/L (ref 6–29)
AST: 10 U/L (ref 10–35)
Albumin: 3.8 g/dL (ref 3.6–5.1)
Alkaline phosphatase (APISO): 112 U/L (ref 37–153)
BUN: 13 mg/dL (ref 7–25)
CO2: 28 mmol/L (ref 20–32)
Calcium: 9.1 mg/dL (ref 8.6–10.4)
Chloride: 97 mmol/L — ABNORMAL LOW (ref 98–110)
Creat: 0.91 mg/dL (ref 0.60–0.95)
Globulin: 3.2 g/dL (ref 1.9–3.7)
Glucose, Bld: 106 mg/dL (ref 65–139)
Potassium: 4.1 mmol/L (ref 3.5–5.3)
Sodium: 135 mmol/L (ref 135–146)
Total Bilirubin: 0.4 mg/dL (ref 0.2–1.2)
Total Protein: 7 g/dL (ref 6.1–8.1)
eGFR: 58 mL/min/{1.73_m2} — ABNORMAL LOW

## 2024-09-02 LAB — CBC WITH DIFFERENTIAL/PLATELET
Absolute Lymphocytes: 1489 {cells}/uL (ref 850–3900)
Absolute Monocytes: 388 {cells}/uL (ref 200–950)
Basophils Absolute: 31 {cells}/uL (ref 0–200)
Basophils Relative: 0.6 %
Eosinophils Absolute: 20 {cells}/uL (ref 15–500)
Eosinophils Relative: 0.4 %
HCT: 37.7 % (ref 35.9–46.0)
Hemoglobin: 11.3 g/dL — ABNORMAL LOW (ref 11.7–15.5)
MCH: 24.5 pg — ABNORMAL LOW (ref 27.0–33.0)
MCHC: 30 g/dL — ABNORMAL LOW (ref 31.6–35.4)
MCV: 81.6 fL (ref 81.4–101.7)
MPV: 10.6 fL (ref 7.5–12.5)
Monocytes Relative: 7.6 %
Neutro Abs: 3172 {cells}/uL (ref 1500–7800)
Neutrophils Relative %: 62.2 %
Platelets: 249 10*3/uL (ref 140–400)
RBC: 4.62 Million/uL (ref 3.80–5.10)
RDW: 16 % — ABNORMAL HIGH (ref 11.0–15.0)
Total Lymphocyte: 29.2 %
WBC: 5.1 10*3/uL (ref 3.8–10.8)

## 2024-09-02 LAB — URINE CULTURE
MICRO NUMBER:: 17550333
Result:: NO GROWTH
SPECIMEN QUALITY:: ADEQUATE

## 2024-09-02 LAB — URINALYSIS, ROUTINE W REFLEX MICROSCOPIC
Bilirubin Urine: NEGATIVE
Glucose, UA: NEGATIVE
Hgb urine dipstick: NEGATIVE
Ketones, ur: NEGATIVE
Leukocytes,Ua: NEGATIVE
Nitrite: NEGATIVE
Protein, ur: NEGATIVE
Specific Gravity, Urine: 1.007 (ref 1.001–1.035)
pH: 7 (ref 5.0–8.0)

## 2024-09-02 LAB — VITAMIN B12: Vitamin B-12: 441 pg/mL (ref 200–1100)

## 2024-09-06 ENCOUNTER — Telehealth

## 2024-09-07 ENCOUNTER — Telehealth

## 2024-10-08 ENCOUNTER — Ambulatory Visit: Admitting: Family Medicine

## 2024-10-12 ENCOUNTER — Encounter: Admitting: Nurse Practitioner

## 2025-07-25 ENCOUNTER — Ambulatory Visit: Admitting: Urology
